# Patient Record
Sex: Male | Born: 1950
Health system: Southern US, Community
[De-identification: ages and names within clinical notes are randomized; demographics above are authoritative.]

## PROBLEM LIST (undated history)

## (undated) DIAGNOSIS — G40909 Epilepsy, unspecified, not intractable, without status epilepticus: Secondary | ICD-10-CM

## (undated) DIAGNOSIS — I4891 Unspecified atrial fibrillation: Secondary | ICD-10-CM

## (undated) DIAGNOSIS — R569 Unspecified convulsions: Secondary | ICD-10-CM

## (undated) DIAGNOSIS — Z8673 Personal history of transient ischemic attack (TIA), and cerebral infarction without residual deficits: Secondary | ICD-10-CM

## (undated) DIAGNOSIS — F1010 Alcohol abuse, uncomplicated: Secondary | ICD-10-CM

## (undated) DIAGNOSIS — I10 Essential (primary) hypertension: Secondary | ICD-10-CM

## (undated) HISTORY — DX: Epilepsy, unspecified, not intractable, without status epilepticus: G40.909

## (undated) HISTORY — DX: Unspecified atrial fibrillation: I48.91

## (undated) HISTORY — DX: Alcohol abuse, uncomplicated: F10.10

## (undated) HISTORY — PX: JOINT REPLACEMENT: SHX530

## (undated) HISTORY — DX: Personal history of transient ischemic attack (TIA), and cerebral infarction without residual deficits: Z86.73

---

## 2004-08-11 ENCOUNTER — Ambulatory Visit: Payer: Self-pay

## 2004-09-24 ENCOUNTER — Emergency Department: Payer: Self-pay | Admitting: Emergency Medicine

## 2005-03-09 ENCOUNTER — Other Ambulatory Visit: Payer: Self-pay

## 2005-03-16 ENCOUNTER — Inpatient Hospital Stay: Payer: Self-pay | Admitting: Specialist

## 2006-06-09 ENCOUNTER — Ambulatory Visit: Payer: Self-pay | Admitting: Nurse Practitioner

## 2006-11-15 ENCOUNTER — Ambulatory Visit: Payer: Self-pay | Admitting: Gastroenterology

## 2007-05-20 ENCOUNTER — Ambulatory Visit: Payer: Self-pay | Admitting: Family Medicine

## 2007-06-13 ENCOUNTER — Ambulatory Visit: Payer: Self-pay | Admitting: Family Medicine

## 2016-04-05 ENCOUNTER — Emergency Department: Payer: Medicare Other

## 2016-04-05 ENCOUNTER — Inpatient Hospital Stay: Payer: Medicare Other

## 2016-04-05 ENCOUNTER — Inpatient Hospital Stay
Admission: EM | Admit: 2016-04-05 | Discharge: 2016-04-06 | DRG: 065 | Disposition: A | Discharge status: Home / Self Care | Payer: Medicare Other | Attending: Internal Medicine | Admitting: Internal Medicine

## 2016-04-05 ENCOUNTER — Encounter: Payer: Self-pay | Admitting: Emergency Medicine

## 2016-04-05 DIAGNOSIS — Z8673 Personal history of transient ischemic attack (TIA), and cerebral infarction without residual deficits: Secondary | ICD-10-CM | POA: Diagnosis present

## 2016-04-05 DIAGNOSIS — J441 Chronic obstructive pulmonary disease with (acute) exacerbation: Secondary | ICD-10-CM | POA: Diagnosis present

## 2016-04-05 DIAGNOSIS — F1010 Alcohol abuse, uncomplicated: Secondary | ICD-10-CM | POA: Diagnosis present

## 2016-04-05 DIAGNOSIS — I10 Essential (primary) hypertension: Secondary | ICD-10-CM | POA: Diagnosis present

## 2016-04-05 DIAGNOSIS — I639 Cerebral infarction, unspecified: Secondary | ICD-10-CM | POA: Diagnosis not present

## 2016-04-05 DIAGNOSIS — R0602 Shortness of breath: Secondary | ICD-10-CM | POA: Diagnosis not present

## 2016-04-05 DIAGNOSIS — I638 Other cerebral infarction: Secondary | ICD-10-CM | POA: Diagnosis not present

## 2016-04-05 DIAGNOSIS — E785 Hyperlipidemia, unspecified: Secondary | ICD-10-CM | POA: Diagnosis present

## 2016-04-05 DIAGNOSIS — Z23 Encounter for immunization: Secondary | ICD-10-CM

## 2016-04-05 DIAGNOSIS — E119 Type 2 diabetes mellitus without complications: Secondary | ICD-10-CM | POA: Diagnosis present

## 2016-04-05 DIAGNOSIS — M6281 Muscle weakness (generalized): Secondary | ICD-10-CM

## 2016-04-05 DIAGNOSIS — Z9114 Patient's other noncompliance with medication regimen: Secondary | ICD-10-CM | POA: Diagnosis not present

## 2016-04-05 DIAGNOSIS — Z87891 Personal history of nicotine dependence: Secondary | ICD-10-CM

## 2016-04-05 DIAGNOSIS — R200 Anesthesia of skin: Secondary | ICD-10-CM

## 2016-04-05 DIAGNOSIS — J449 Chronic obstructive pulmonary disease, unspecified: Secondary | ICD-10-CM

## 2016-04-05 HISTORY — DX: Essential (primary) hypertension: I10

## 2016-04-05 LAB — COMPREHENSIVE METABOLIC PANEL
ALT: 12 U/L — ABNORMAL LOW (ref 17–63)
ANION GAP: 12 (ref 5–15)
AST: 31 U/L (ref 15–41)
Albumin: 4.6 g/dL (ref 3.5–5.0)
Alkaline Phosphatase: 73 U/L (ref 38–126)
BUN: 8 mg/dL (ref 6–20)
CHLORIDE: 100 mmol/L — AB (ref 101–111)
CO2: 26 mmol/L (ref 22–32)
Calcium: 9.4 mg/dL (ref 8.9–10.3)
Creatinine, Ser: 0.85 mg/dL (ref 0.61–1.24)
GFR calc non Af Amer: >60 mL/min (ref >60)
Glucose, Bld: 98 mg/dL (ref 65–99)
Potassium: 3.4 mmol/L — ABNORMAL LOW (ref 3.5–5.1)
SODIUM: 138 mmol/L (ref 135–145)
Total Bilirubin: 0.8 mg/dL (ref 0.3–1.2)
Total Protein: 8.3 g/dL — ABNORMAL HIGH (ref 6.5–8.1)

## 2016-04-05 LAB — CBC
HCT: 43.8 % (ref 40.0–52.0)
HEMOGLOBIN: 15 g/dL (ref 13.0–18.0)
MCH: 31.1 pg (ref 26.0–34.0)
MCHC: 34.4 g/dL (ref 32.0–36.0)
MCV: 90.4 fL (ref 80.0–100.0)
Platelets: 202 10*3/uL (ref 150–440)
RBC: 4.84 MIL/uL (ref 4.40–5.90)
RDW: 15.3 % — AB (ref 11.5–14.5)
WBC: 7.4 10*3/uL (ref 3.8–10.6)

## 2016-04-05 LAB — BLOOD GAS, VENOUS
ACID-BASE EXCESS: 3.5 mmol/L — AB (ref 0.0–2.0)
BICARBONATE: 27.8 mmol/L (ref 20.0–28.0)
PCO2 VEN: 40 mmHg — AB (ref 44.0–60.0)
Patient temperature: 37
pH, Ven: 7.45 — ABNORMAL HIGH (ref 7.250–7.430)

## 2016-04-05 LAB — TROPONIN I

## 2016-04-05 LAB — BRAIN NATRIURETIC PEPTIDE: B Natriuretic Peptide: 104 pg/mL — ABNORMAL HIGH (ref 0.0–100.0)

## 2016-04-05 MED ORDER — ALBUTEROL SULFATE (2.5 MG/3ML) 0.083% IN NEBU: 2.5000 mg | INHALATION_SOLUTION | RESPIRATORY_TRACT | Status: DC | PRN

## 2016-04-05 MED ORDER — NITROGLYCERIN 0.4 MG SL SUBL
SUBLINGUAL_TABLET | SUBLINGUAL | Status: AC
  Filled 2016-04-05: qty 1

## 2016-04-05 MED ORDER — CLONIDINE HCL 0.1 MG PO TABS
0.2000 mg | ORAL_TABLET | Freq: Two times a day (BID) | ORAL | Status: DC
  Administered 2016-04-05 – 2016-04-06 (×2): 0.2 mg via ORAL
  Filled 2016-04-05 (×2): qty 2

## 2016-04-05 MED ORDER — METHYLPREDNISOLONE SODIUM SUCC 40 MG IJ SOLR
40.0000 mg | Freq: Three times a day (TID) | INTRAMUSCULAR | Status: DC
  Administered 2016-04-05 – 2016-04-06 (×3): 40 mg via INTRAVENOUS
  Filled 2016-04-05 (×3): qty 1

## 2016-04-05 MED ORDER — FOLIC ACID 1 MG PO TABS
1.0000 mg | ORAL_TABLET | Freq: Every day | ORAL | Status: DC
  Administered 2016-04-06: 10:00:00 1 mg via ORAL
  Filled 2016-04-05: qty 1

## 2016-04-05 MED ORDER — STROKE: EARLY STAGES OF RECOVERY BOOK
Freq: Once | Status: AC
  Administered 2016-04-05: 17:00:00

## 2016-04-05 MED ORDER — DEXTROSE 5 % IV SOLN
500.0000 mg | INTRAVENOUS | Status: DC
  Administered 2016-04-05: 500 mg via INTRAVENOUS
  Filled 2016-04-05 (×2): qty 500

## 2016-04-05 MED ORDER — IPRATROPIUM-ALBUTEROL 0.5-2.5 (3) MG/3ML IN SOLN
3.0000 mL | Freq: Once | RESPIRATORY_TRACT | Status: AC
  Administered 2016-04-05: 3 mL via RESPIRATORY_TRACT

## 2016-04-05 MED ORDER — LORAZEPAM 2 MG/ML IJ SOLN: 1.0000 mg | Freq: Four times a day (QID) | INTRAMUSCULAR | Status: DC | PRN

## 2016-04-05 MED ORDER — ASPIRIN 300 MG RE SUPP: 300.0000 mg | Freq: Every day | RECTAL | Status: DC

## 2016-04-05 MED ORDER — LISINOPRIL 10 MG PO TABS
10.0000 mg | ORAL_TABLET | Freq: Once | ORAL | Status: AC
  Administered 2016-04-05: 10 mg via ORAL
  Filled 2016-04-05: qty 1

## 2016-04-05 MED ORDER — PNEUMOCOCCAL VAC POLYVALENT 25 MCG/0.5ML IJ INJ
0.5000 mL | INJECTION | INTRAMUSCULAR | Status: AC
  Administered 2016-04-06: 0.5 mL via INTRAMUSCULAR
  Filled 2016-04-05: qty 0.5

## 2016-04-05 MED ORDER — MAGNESIUM SULFATE 2 GM/50ML IV SOLN
2.0000 g | Freq: Once | INTRAVENOUS | Status: AC
  Administered 2016-04-05: 2 g via INTRAVENOUS
  Filled 2016-04-05: qty 50

## 2016-04-05 MED ORDER — ACETAMINOPHEN 325 MG PO TABS
650.0000 mg | ORAL_TABLET | ORAL | Status: DC | PRN
  Administered 2016-04-05 – 2016-04-06 (×2): 650 mg via ORAL
  Filled 2016-04-05 (×2): qty 2

## 2016-04-05 MED ORDER — FUROSEMIDE 10 MG/ML IJ SOLN
INTRAMUSCULAR | Status: AC
  Filled 2016-04-05: qty 10

## 2016-04-05 MED ORDER — HYDRALAZINE HCL 50 MG PO TABS
50.0000 mg | ORAL_TABLET | Freq: Three times a day (TID) | ORAL | Status: DC
  Administered 2016-04-05 – 2016-04-06 (×2): 50 mg via ORAL
  Filled 2016-04-05 (×2): qty 1

## 2016-04-05 MED ORDER — NITROGLYCERIN 2 % TD OINT
1.0000 [in_us] | TOPICAL_OINTMENT | Freq: Once | TRANSDERMAL | Status: AC
  Administered 2016-04-05: 1 [in_us] via TOPICAL

## 2016-04-05 MED ORDER — THIAMINE HCL 100 MG/ML IJ SOLN
100.0000 mg | Freq: Every day | INTRAMUSCULAR | Status: DC
  Administered 2016-04-05 – 2016-04-06 (×2): 100 mg via INTRAVENOUS
  Filled 2016-04-05 (×2): qty 2

## 2016-04-05 MED ORDER — METOPROLOL TARTRATE 5 MG/5ML IV SOLN
5.0000 mg | INTRAVENOUS | Status: DC | PRN
  Administered 2016-04-05 (×2): 5 mg via INTRAVENOUS
  Filled 2016-04-05 (×3): qty 5

## 2016-04-05 MED ORDER — ACETAMINOPHEN 160 MG/5ML PO SOLN: 650.0000 mg | ORAL | Status: DC | PRN

## 2016-04-05 MED ORDER — ACETAMINOPHEN 650 MG RE SUPP
650.0000 mg | RECTAL | Status: DC | PRN
Start: 2016-04-05 — End: 2016-04-06

## 2016-04-05 MED ORDER — METHYLPREDNISOLONE SODIUM SUCC 125 MG IJ SOLR
125.0000 mg | Freq: Once | INTRAMUSCULAR | Status: AC
  Administered 2016-04-05: 125 mg via INTRAVENOUS
  Filled 2016-04-05: qty 2

## 2016-04-05 MED ORDER — IPRATROPIUM-ALBUTEROL 0.5-2.5 (3) MG/3ML IN SOLN: 3.0000 mL | Freq: Four times a day (QID) | RESPIRATORY_TRACT | Status: DC | PRN

## 2016-04-05 MED ORDER — IPRATROPIUM-ALBUTEROL 0.5-2.5 (3) MG/3ML IN SOLN
3.0000 mL | Freq: Four times a day (QID) | RESPIRATORY_TRACT | Status: DC
  Administered 2016-04-05: 3 mL via RESPIRATORY_TRACT
  Filled 2016-04-05: qty 3

## 2016-04-05 MED ORDER — IPRATROPIUM-ALBUTEROL 0.5-2.5 (3) MG/3ML IN SOLN
RESPIRATORY_TRACT | Status: AC
  Filled 2016-04-05: qty 3

## 2016-04-05 MED ORDER — ADULT MULTIVITAMIN W/MINERALS CH
1.0000 | ORAL_TABLET | Freq: Every day | ORAL | Status: DC
  Administered 2016-04-06: 1 via ORAL
  Filled 2016-04-05: qty 1

## 2016-04-05 MED ORDER — INFLUENZA VAC SPLIT QUAD 0.5 ML IM SUSY
0.5000 mL | PREFILLED_SYRINGE | INTRAMUSCULAR | Status: AC
  Administered 2016-04-06: 10:00:00 0.5 mL via INTRAMUSCULAR
  Filled 2016-04-05: qty 0.5

## 2016-04-05 MED ORDER — NITROGLYCERIN 0.4 MG SL SUBL
0.4000 mg | SUBLINGUAL_TABLET | SUBLINGUAL | Status: DC | PRN
  Administered 2016-04-05: 0.4 mg via SUBLINGUAL

## 2016-04-05 MED ORDER — METOPROLOL TARTRATE 50 MG PO TABS
50.0000 mg | ORAL_TABLET | Freq: Two times a day (BID) | ORAL | Status: DC
  Administered 2016-04-05 – 2016-04-06 (×2): 50 mg via ORAL
  Filled 2016-04-05 (×2): qty 1

## 2016-04-05 MED ORDER — HYDRALAZINE HCL 20 MG/ML IJ SOLN
10.0000 mg | Freq: Four times a day (QID) | INTRAMUSCULAR | Status: DC | PRN
  Administered 2016-04-05: 17:00:00 10 mg via INTRAVENOUS
  Filled 2016-04-05: qty 1

## 2016-04-05 MED ORDER — ENOXAPARIN SODIUM 40 MG/0.4ML ~~LOC~~ SOLN
40.0000 mg | SUBCUTANEOUS | Status: DC
  Administered 2016-04-05: 22:00:00 40 mg via SUBCUTANEOUS
  Filled 2016-04-05: qty 0.4

## 2016-04-05 MED ORDER — SENNOSIDES-DOCUSATE SODIUM 8.6-50 MG PO TABS: 1.0000 | ORAL_TABLET | Freq: Every evening | ORAL | Status: DC | PRN

## 2016-04-05 MED ORDER — NITROGLYCERIN 2 % TD OINT
TOPICAL_OINTMENT | TRANSDERMAL | Status: AC
  Filled 2016-04-05: qty 1

## 2016-04-05 MED ORDER — IPRATROPIUM-ALBUTEROL 0.5-2.5 (3) MG/3ML IN SOLN
3.0000 mL | Freq: Once | RESPIRATORY_TRACT | Status: AC
  Administered 2016-04-05: 3 mL via RESPIRATORY_TRACT
  Filled 2016-04-05: qty 3

## 2016-04-05 NOTE — ED Notes (Signed)
Called MRI Tech [phone]

## 2016-04-05 NOTE — ED Triage Notes (Addendum)
Pt reports feeling short of breath for a few days, worsening tonight; pt arrives diaphoretic; reports productive cough; pt adds yesterday he began having numbness to the left side of his face and left arm; has not checked temp at home but says he has been feeling warm; denies pain

## 2016-04-05 NOTE — ED Notes (Signed)
Report to felicia, rn.  

## 2016-04-05 NOTE — ED Notes (Signed)
Pox of 90% on ra. Pt placed on oxygen at 2lpm via [place]. Rebound pox of 95%.

## 2016-04-05 NOTE — ED Notes (Signed)
Pt taken to treatment room 12 via wheelchair; verbal report given to April, RN and Dr Zenda Alpers

## 2016-04-05 NOTE — ED Notes (Signed)
Awaiting acceptable BP per floor perameters. PRN meds given. And will continue to monitor.

## 2016-04-05 NOTE — ED Provider Notes (Signed)
[facility]  I accepted care from Dr. Zenda Alpers ____________________________________________    LABS (pertinent positives/negatives)  Labs Reviewed  CBC - Abnormal; Notable for the following:       Result Value   RDW 15.3 (*)    All other components within normal limits  COMPREHENSIVE METABOLIC PANEL - Abnormal; Notable for the following:    Potassium 3.4 (*)    Chloride 100 (*)    Total Protein 8.3 (*)    ALT 12 (*)    All other components within normal limits  BLOOD GAS, VENOUS - Abnormal; Notable for the following:    pH, Ven 7.45 (*)    pCO2, Ven 40 (*)    Acid-Base Excess 3.5 (*)    All other components within normal limits  BRAIN NATRIURETIC PEPTIDE - Abnormal; Notable for the following:    B Natriuretic Peptide 104.0 (*)    All other components within normal limits  TROPONIN I     ____________________________________________    RADIOLOGY All xrays were viewed by me. Imaging interpreted by radiologist.  MRI brain: IMPRESSION: Acute 3 mm lacunar infarct of the RIGHT thalamus is consistent with reported LEFT-sided sensory symptoms.  Atrophy and small vessel disease.  ____________________________________________   PROCEDURES  Procedure(s) performed: None  Critical Care performed: None  ____________________________________________   INITIAL IMPRESSION / ASSESSMENT AND PLAN / ED COURSE   Pertinent labs & imaging results that were available during my care of the patient were reviewed by me and considered in my medical decision making (see chart for details).  I accepted care from Dr. Zenda Alpers, awaiting MRI results after normal head CT.  Patient had received treatment for wheezing/COPD which patient is somewhat improved. He does still have elevated blood pressures, given ischemic stroke, will allow permissive hypertension, monitoring closely with goal of treatment for ED 220/120.  Patient be admitted to the hospitalist  service.  CONSULTATIONS: Hospitalist for admission    Patient / Family / Caregiver informed of clinical course, medical decision-making process, and agree with plan.   ____________________________________________   FINAL CLINICAL IMPRESSION(S) / ED DIAGNOSES  Final diagnoses:  Chronic obstructive pulmonary disease, unspecified COPD type (HCC)  Numbness  Shortness of breath  Acute ischemic stroke (HCC)        Governor Rooks, MD 04/05/16 1244

## 2016-04-05 NOTE — ED Notes (Signed)
Pt with improved resp distress. Pt states he feels 50% improved.

## 2016-04-05 NOTE — H&P (Signed)
[facility] [place] at [facility]   PATIENT NAME: John Spears    MR#:  [id number]  DATE OF BIRTH:  [birth year]  DATE OF ADMISSION:  04/05/2016  PRIMARY CARE PHYSICIAN: Pcp Not In System   REQUESTING/REFERRING PHYSICIAN:Rebecca Lord, MD  CHIEF COMPLAINT:  Left facial numbness and shortness of breath  HISTORY OF PRESENT ILLNESS:  John Spears  is a 66 y.o. male with a known history of Hypertension not on any home medications is presenting to the ED with a chief complaint of left facial numbness and left upper extremity numbness associated with shortness of breath. Patient was not taking any blood pressure medications for the past 6-8 years. Admits to drinking alcohol almost every day in a week Initial CT head is negative but MRI has revealed a right thalamus CVA. Patient has received IV Solu-Medrol and breathing treatments  PAST MEDICAL HISTORY:   Past Medical History:  Diagnosis Date  . Hypertension     PAST SURGICAL HISTOIRY:   Past Surgical History:  Procedure Laterality Date  . JOINT REPLACEMENT      SOCIAL HISTORY:   Social History  Substance Use Topics  . Smoking status: Former Games developer  . Smokeless tobacco: Not on file  . Alcohol use Yes    FAMILY HISTORY:   Father has history of strokes DRUG ALLERGIES:  No Known Allergies  REVIEW OF SYSTEMS:  CONSTITUTIONAL: No fever, fatigue or weakness.  EYES: No blurred or double vision.  EARS, NOSE, AND THROAT: No tinnitus or ear pain.  RESPIRATORY: No cough, Reporting shortness of breath, denies wheezing or hemoptysis.  CARDIOVASCULAR: No chest pain, orthopnea, edema.  GASTROINTESTINAL: No nausea, vomiting, diarrhea or abdominal pain.  GENITOURINARY: No dysuria, hematuria.  ENDOCRINE: No polyuria, nocturia,  HEMATOLOGY: No anemia, easy bruising or bleeding SKIN: No rash or lesion. MUSCULOSKELETAL: No joint pain or arthritis.   NEUROLOGIC: Reporting left facial and left upper extending  to numbness and weakness Psychiatry denies any depression   MEDICATIONS AT HOME:   Prior to Admission medications   Not on File      VITAL SIGNS:  Blood pressure (!) 195/110, pulse 88, temperature 98.3 F (36.8 C), temperature source Oral, resp. rate 18, height 5\' 11"  (1.803 m), weight 81.6 kg (180 lb), SpO2 94 %.  PHYSICAL EXAMINATION:  GENERAL:  66 y.o.-year-old patient lying in the bed with no acute distress.  EYES: Pupils equal, round, reactive to light and accommodation. No scleral icterus. Extraocular muscles intact.  HEENT: Head atraumatic, normocephalic. Oropharynx and nasopharynx clear.  NECK:  Supple, no jugular venous distention. No thyroid enlargement, no tenderness.  LUNGS: Moderate breath sounds bilaterally, no wheezing, rales,rhonchi or crepitation. No use of accessory muscles of respiration.  CARDIOVASCULAR: S1, S2 normal. No murmurs, rubs, or gallops.  ABDOMEN: Soft, nontender, nondistended. Bowel sounds present. No organomegaly or mass.  EXTREMITIES: No pedal edema, cyanosis, or clubbing.  NEUROLOGIC: Cranial nerves II through XII are intact. Muscle strength 5/5 in all extremities.left face and left upper extremity with decreased touch sensation Gait not checked.  PSYCHIATRIC: The patient is alert and oriented x 3.  SKIN: No obvious rash, lesion, or ulcer.   LABORATORY PANEL:   CBC  Recent Labs Lab 04/05/16 0520  WBC 7.4  HGB 15.0  HCT 43.8  PLT 202   ------------------------------------------------------------------------------------------------------------------  Chemistries   Recent Labs Lab 04/05/16 0520  NA 138  K 3.4*  CL 100*  CO2 26  GLUCOSE 98  BUN 8  CREATININE 0.85  CALCIUM 9.4  AST 31  ALT 12*  ALKPHOS 73  BILITOT 0.8   ------------------------------------------------------------------------------------------------------------------  Cardiac Enzymes  Recent Labs Lab 04/05/16 0520  TROPONINI <0.03    ------------------------------------------------------------------------------------------------------------------  RADIOLOGY:  Dg Chest 2 View  Result Date: 04/05/2016 CLINICAL DATA:  Shortness of breath for a few days, worse tonight. Cough. Numbness in the left face and left arm. EXAM: CHEST  2 VIEW COMPARISON:  03/09/2005 FINDINGS: The heart size and mediastinal contours are within normal limits. Both lungs are clear. The visualized skeletal structures are unremarkable. IMPRESSION: No active cardiopulmonary disease. Electronically Signed   By: Burman Nieves M.D.   On: 04/05/2016 05:59   Ct Head Wo Contrast  Result Date: 04/05/2016 CLINICAL DATA:  Shortness of breath and diaphoresis. Numbness to the left side of face and left arm. EXAM: CT HEAD WITHOUT CONTRAST TECHNIQUE: Contiguous axial images were obtained from the base of the skull through the vertex without intravenous contrast. COMPARISON:  None. FINDINGS: Brain: Mild diffuse cerebral atrophy. Mild ventricular dilatation consistent with central atrophy. Scattered low-attenuation changes in the deep white matter consistent with small vessel ischemia. No mass effect or midline shift. No abnormal extra-axial fluid collections. Gray-white matter junctions are distinct. Basal cisterns are not effaced. Vascular: No hyperdense vessel or unexpected calcification. Skull: Normal. Negative for fracture or focal lesion. Sinuses/Orbits: Mild mucosal thickening in the paranasal sinuses. Old appearing fracture deformity of the right medial orbital wall. Mastoid air cells are not opacified. Other: None. IMPRESSION: No acute intracranial abnormalities. Mild chronic atrophy and small vessel ischemic changes. Electronically Signed   By: Burman Nieves M.D.   On: 04/05/2016 06:21   Mr Brain Wo Contrast  Result Date: 04/05/2016 CLINICAL DATA:  Numbness in LEFT arm and LEFT side of face beginning earlier today. Chronic hypertension, not currently treated.  Elevated blood pressure on admission. EXAM: MRI HEAD WITHOUT CONTRAST TECHNIQUE: Multiplanar, multiecho pulse sequences of the brain and surrounding structures were obtained without intravenous contrast. COMPARISON:  CT head at 6 a.m., earlier today FINDINGS: Brain: There is a 3 mm area of restricted diffusion in the RIGHT thalamus representing an acute lacunar infarct. No hemorrhage, mass lesion, hydrocephalus, or extra-axial fluid. Premature for age cerebral and cerebellar atrophy. Mild to moderate T2 and FLAIR hyperintensity in the periventricular and subcortical white matter, likely representing chronic microvascular ischemic change. There is a chronic lacunar infarction of the RIGHT caudate nucleus. Vascular: Normal flow voids. Skull and upper cervical spine: Normal marrow signal. Sinuses/Orbits: No acute findings. There appears to be a chronic blowout injury of the RIGHT orbit. Other: None. Compared with earlier CT, the infarct is not visible. IMPRESSION: Acute 3 mm lacunar infarct of the RIGHT thalamus is consistent with reported LEFT-sided sensory symptoms. Atrophy and small vessel disease. Electronically Signed   By: Elsie Stain M.D.   On: 04/05/2016 11:33    EKG:   Orders placed or performed during the hospital encounter of 04/05/16  . EKG 12-Lead  . EKG 12-Lead  . ED EKG within 10 minutes  . ED EKG within 10 minutes    IMPRESSION AND PLAN:   John Spears  is a 66 y.o. male with a known history of Hypertension not on any home medications is presenting to the ED with a chief complaint of left facial numbness and left upper extremity numbness associated with shortness of breath. Patient was not taking any blood pressure medications for the past 6-8 years. Admits to drinking alcohol almost every day in a week  Initial CT head is negative but MRI has revealed a right thalamus CVA  # acute ischemic CVA-right thalamus stroke per MRI admit to MedSurg unit   Will get complete stroke workup  with carotid Dopplers and 2-D echocardiogram   aspirin rectally Check fasting lipid panel, TSH and hemoglobin A1c PTOT and speech therapy consult Nothing by mouth until patient passes bedside swallow evaluation Neurology consult placed  #Malignant hypertension which could be the etiology for acute ischemic CVA At this point allow permissive hypertension at this time IV Lopressor as needed systolic blood pressure is greater than 190 Rounding physician please consider to start.patient on oral antihypertensives for tight control of the blood pressure  #COPD exacerbation Solum Medrol, nebulizer treatments Supportive treatment and azithromycin  #Alcohol abuse Patient drinks alcohol almost every day ciwa Counseled to stop drinking alcohol. Patient will be benefited with outpatient alcohol rehabilitation  GI and DVT prophylaxis  All the records are reviewed and case discussed with ED provider. Management plans discussed with the patient, family and they are in agreement.  CODE STATUS: Full code, sister is the healthcare power of attorney  TOTAL TIME TAKING CARE OF THIS PATIENT: .   Note: This dictation was prepared with Dragon dictation along with smaller phrase technology. Any transcriptional errors that result from this process are unintentional.  Ramonita Lab M.D on 04/05/2016 at 1:40 PM  Between 7am to 6pm - Pager - [phone]  After 6pm go to [email or web address] - password [email or web address]  [facility]  Office  [phone]  CC: Primary care physician; Pcp Not In System

## 2016-04-05 NOTE — ED Notes (Signed)
Admitting MD at bedside.

## 2016-04-05 NOTE — ED Notes (Signed)
Mri, walk 02 sat // bp recheck   Governor Rooks, MD 04/05/16 [phone]

## 2016-04-05 NOTE — ED Provider Notes (Signed)
[facility] Emergency Department Provider Note   ____________________________________________   First MD Initiated Contact with Patient 04/05/16 [id number]     (approximate)  I have reviewed the triage vital signs and the nursing notes.   HISTORY  Chief Complaint Shortness of Breath and Numbness    HPI John Spears is a 66 y.o. male who comes into the hospital today with some numbness to the left arm and left side of his face. He reports he is also has some difficulty breathing. The numbness started yesterday afternoon but he reports that he's been having difficulty breathing for a couple of days. The patient hasn't taken anything for the symptoms. He has a cough and some mild chest pain. The patient also reports that he does have some sweats but denies any nausea, vomiting, dizziness, lightheadedness, abdominal pain. The patient has not been taking anything for his blood pressure for the past 6-8 years ago does not know his blood pressure typically runs. He reports that he drinks daily mostly beer about 3-4 times a day. The patient decided to come into the hospital today for further evaluation of these symptoms.   Past Medical History:  Diagnosis Date  . Hypertension     There are no active problems to display for this patient.   Past Surgical History:  Procedure Laterality Date  . JOINT REPLACEMENT      Prior to Admission medications   Not on File    Allergies Patient has no known allergies.  No family history on file.  Social History Social History  Substance Use Topics  . Smoking status: Former Games developer  . Smokeless tobacco: Not on file  . Alcohol use Yes    Review of Systems Constitutional: No fever/chills Eyes: No visual changes. ENT: No sore throat. Cardiovascular:  chest pain. Respiratory: cough and shortness of breath. Gastrointestinal: No abdominal pain.  No nausea, no vomiting.  No diarrhea.  No constipation. Genitourinary:  Negative for dysuria. Musculoskeletal: Negative for back pain. Skin: Negative for rash. Neurological: Facial numbness and left arm numbness  10-point ROS otherwise negative.  ____________________________________________   PHYSICAL EXAM:  VITAL SIGNS: ED Triage Vitals  Enc Vitals Group     BP 04/05/16 0511 (!) 247/114     Pulse Rate 04/05/16 0511 (!) 105     Resp 04/05/16 0511 20     Temp 04/05/16 0511 98.3 F (36.8 C)     Temp Source 04/05/16 0511 Oral     SpO2 04/05/16 0511 97 %     Weight 04/05/16 0511 180 lb (81.6 kg)     Height 04/05/16 0511 5\' 11"  (1.803 m)     Head Circumference --      Peak Flow --      Pain Score 04/05/16 0526 9     Pain Loc --      Pain Edu? --      Excl. in GC? --     Constitutional: Alert and oriented. Well appearing and in Moderate distress. Eyes: Conjunctivae are normal. PERRL. EOMI. Head: Atraumatic. Nose: No congestion/rhinnorhea. Mouth/Throat: Mucous membranes are moist.  Oropharynx non-erythematous. Cardiovascular: Normal rate, regular rhythm. Grossly normal heart sounds.  Good peripheral circulation. Respiratory: Increased respiratory effort.  No retractions. Expiratory wheezing throughout all lung fields. Gastrointestinal: Soft and nontender. No distention. Positive bowel sounds Musculoskeletal: No lower extremity tenderness nor edema.   Neurologic:  Normal speech and language. Cranial nerves II through XII are grossly intact aside from the patient having some left-sided  facial numbness and left upper extremity numbness, no ataxia with finger to nose, no difficulty with rapid alternating movement. Strength is 5 out of 5 throughout. Skin:  Skin is warm, dry and intact.  Psychiatric: Mood and affect are normal.   ____________________________________________   LABS (all labs ordered are listed, but only abnormal results are displayed)  Labs Reviewed  CBC - Abnormal; Notable for the following:       Result Value   RDW 15.3 (*)     All other components within normal limits  COMPREHENSIVE METABOLIC PANEL - Abnormal; Notable for the following:    Potassium 3.4 (*)    Chloride 100 (*)    Total Protein 8.3 (*)    ALT 12 (*)    All other components within normal limits  BLOOD GAS, VENOUS - Abnormal; Notable for the following:    pH, Ven 7.45 (*)    pCO2, Ven 40 (*)    Acid-Base Excess 3.5 (*)    All other components within normal limits  BRAIN NATRIURETIC PEPTIDE - Abnormal; Notable for the following:    B Natriuretic Peptide 104.0 (*)    All other components within normal limits  TROPONIN I   ____________________________________________  EKG  ED ECG REPORT I, Rebecka Apley, the attending physician, personally viewed and interpreted this ECG.   Date: 04/05/2016  EKG Time: 509  Rate: 104  Rhythm: sinus tachycardia  Axis: left axis deviation  Intervals:left anterior fascicular block  ST&T Change: None  ____________________________________________  RADIOLOGY  CT head CXR ____________________________________________   PROCEDURES  Procedure(s) performed: None  Procedures  Critical Care performed: No  ____________________________________________   INITIAL IMPRESSION / ASSESSMENT AND PLAN / ED COURSE  Pertinent labs & imaging results that were available during my care of the patient were reviewed by me and considered in my medical decision making (see chart for details).  This is a 66 year old male who comes into the hospital today with some shortness of breath. The patient is been having shortness of breath for multiple days. He also has been having some numbness to his face and his upper extremity. The patient's blood pressure is significantly elevated when he arrived but he has not been taking blood pressure medicines for multiple years. He was wheezing when I initially evaluated him sign to give him a DuoNeb and some Solu-Medrol. I placed a Nitropaste to the patient's chest and gave him  some nitroglycerin sublingually. The patient's chest x-ray did not show any pulmonary edema and his BNP was negative. I feel that the patient may be having a COPD exacerbation given his symptoms. I will give the patient 2 more DuoNeb treatments and some magnesium sulfate. Although the patient's CT of his head is negative he is still having some numbness to his face and his arm. I will send the patient for an MRI to evaluate for possible subacute stroke. The patient's care was signed out to Dr. Shaune Pollack who will follow-up the results of the patient's symptoms. We will give the patient a dose of lisinopril as well and he will be reassessed.   Clinical Course as of Apr 05 857  Wynelle Link Apr 05, 2016  0706 No acute intracranial abnormalities. Mild chronic atrophy and small vessel ischemic changes.   CT Head Wo Contrast [AW]  0707 No active cardiopulmonary disease. DG Chest 2 View [AW]    Clinical Course User Index [AW] Rebecka Apley, MD     ____________________________________________   FINAL CLINICAL IMPRESSION(S) /  ED DIAGNOSES  Final diagnoses:  Chronic obstructive pulmonary disease, unspecified COPD type (HCC)  Numbness  Shortness of breath      NEW MEDICATIONS STARTED DURING THIS VISIT:  New Prescriptions   No medications on file     Note:  This document was prepared using Dragon voice recognition software and may include unintentional dictation errors.    Rebecka Apley, MD 04/05/16 0900

## 2016-04-06 ENCOUNTER — Inpatient Hospital Stay
Admit: 2016-04-06 | Discharge: 2016-04-06 | Disposition: A | Discharge status: Home / Self Care | Payer: Medicare Other | Attending: Internal Medicine | Admitting: Internal Medicine

## 2016-04-06 DIAGNOSIS — I639 Cerebral infarction, unspecified: Secondary | ICD-10-CM

## 2016-04-06 LAB — LIPID PANEL
CHOL/HDL RATIO: 3.1 ratio
Cholesterol: 279 mg/dL — ABNORMAL HIGH (ref 0–200)
HDL: 91 mg/dL (ref >40)
LDL CALC: 175 mg/dL — AB (ref 0–99)
TRIGLYCERIDES: 67 mg/dL (ref <150)
VLDL: 13 mg/dL (ref 0–40)

## 2016-04-06 LAB — ECHOCARDIOGRAM COMPLETE
Height: 71 in
Weight: 2880 oz

## 2016-04-06 LAB — TSH: TSH: 0.593 u[IU]/mL (ref 0.350–4.500)

## 2016-04-06 MED ORDER — METOPROLOL TARTRATE 50 MG PO TABS: 50.0000 mg | ORAL_TABLET | Freq: Two times a day (BID) | ORAL | 2 refills | Status: DC

## 2016-04-06 MED ORDER — HYDROCHLOROTHIAZIDE 25 MG PO TABS
25.0000 mg | ORAL_TABLET | Freq: Every day | ORAL | Status: DC
  Administered 2016-04-06: 25 mg via ORAL
  Filled 2016-04-06: qty 1

## 2016-04-06 MED ORDER — VITAMIN B-1 100 MG PO TABS: 100.0000 mg | ORAL_TABLET | Freq: Every day | ORAL | Status: DC

## 2016-04-06 MED ORDER — ASPIRIN 325 MG PO TABS
325.0000 mg | ORAL_TABLET | Freq: Every day | ORAL | Status: DC
  Administered 2016-04-06: 12:00:00 325 mg via ORAL
  Filled 2016-04-06: qty 1

## 2016-04-06 MED ORDER — AMLODIPINE BESYLATE 10 MG PO TABS
10.0000 mg | ORAL_TABLET | Freq: Every day | ORAL | Status: DC
  Administered 2016-04-06: 13:00:00 10 mg via ORAL
  Filled 2016-04-06: qty 1

## 2016-04-06 MED ORDER — ATORVASTATIN CALCIUM 20 MG PO TABS: 40.0000 mg | ORAL_TABLET | Freq: Every day | ORAL | Status: DC

## 2016-04-06 MED ORDER — AMLODIPINE BESYLATE 10 MG PO TABS: 10.0000 mg | ORAL_TABLET | Freq: Every day | ORAL | 2 refills | Status: DC

## 2016-04-06 MED ORDER — ATORVASTATIN CALCIUM 40 MG PO TABS: 40.0000 mg | ORAL_TABLET | Freq: Every day | ORAL | 2 refills | Status: DC

## 2016-04-06 MED ORDER — HYDROCHLOROTHIAZIDE 25 MG PO TABS: 25.0000 mg | ORAL_TABLET | Freq: Every day | ORAL | 2 refills | Status: DC

## 2016-04-06 MED ORDER — ASPIRIN 325 MG PO TABS: 325.0000 mg | ORAL_TABLET | Freq: Every day | ORAL | 2 refills | Status: DC

## 2016-04-06 NOTE — Discharge Summary (Signed)
[facility] [place] [facility]   PATIENT NAME: John Spears    MR#:  [id number]  DATE OF BIRTH:  1951-01-10  DATE OF ADMISSION:  04/05/2016   ADMITTING PHYSICIAN: Ramonita Lab, MD  DATE OF DISCHARGE: 04/06/2016  2:45 PM  PRIMARY CARE PHYSICIAN: Pcp Not In System   ADMISSION DIAGNOSIS:  Shortness of breath [R06.02] Numbness [R20.0] Acute ischemic stroke (HCC) [I63.9] Chronic obstructive pulmonary disease, unspecified COPD type (HCC) [J44.9] DISCHARGE DIAGNOSIS:  Active Problems:   Acute CVA (cerebrovascular accident) (HCC)  SECONDARY DIAGNOSIS:   Past Medical History:  Diagnosis Date  . Hypertension    HOSPITAL COURSE:  John Spears  is a 66 y.o. male with a known history of Hypertension not on any home medications is presenting to the ED with a chief complaint of left facial numbness and left upper extremity numbness associated with shortness of breath. Patient was not taking any blood pressure medications for the past 6-8 years. Admits to drinking alcohol almost every day in a week Initial CT head is negative but MRI has revealed a right thalamus CVA  # acute ischemic CVA-right thalamus stroke per MRI Unremarkable carotid Dopplers and 2-D echocardiogram  Started aspirin and Lipitor.  HLP. Started Lipitor.  #Malignant hypertension. Started hypertension medication. Blood pressure is better controlled.  #COPD exacerbation. Improved. The patient has no complaints. He was treated with Solum Medrol, nebulizer treatments Supportive treatment and azithromycin.  Lungs sounds are clear. No need of prednisone or abx.  #Alcohol abuse Patient drinks alcohol almost every day ciwa Counseled to stop drinking alcohol. Patient will be benefited with outpatient alcohol rehabilitation  DISCHARGE CONDITIONS:  Stable, the patient was discharged home today. CONSULTS OBTAINED:  Treatment Team:  Kym Groom, MD Thana Farr, MD DRUG ALLERGIES:    No Known Allergies DISCHARGE MEDICATIONS:   Allergies as of 04/06/2016   No Known Allergies     Medication List    TAKE these medications   amLODipine 10 MG tablet Commonly known as:  NORVASC Take 1 tablet (10 mg total) by mouth daily.   aspirin 325 MG tablet Take 1 tablet (325 mg total) by mouth daily. Start taking on:  04/07/2016   atorvastatin 40 MG tablet Commonly known as:  LIPITOR Take 1 tablet (40 mg total) by mouth daily at 6 PM.   hydrochlorothiazide 25 MG tablet Commonly known as:  HYDRODIURIL Take 1 tablet (25 mg total) by mouth daily.   metoprolol 50 MG tablet Commonly known as:  LOPRESSOR Take 1 tablet (50 mg total) by mouth 2 (two) times daily.        DISCHARGE INSTRUCTIONS:  See AVS.  If you experience worsening of your admission symptoms, develop shortness of breath, life threatening emergency, suicidal or homicidal thoughts you must seek medical attention immediately by calling 911 or calling your MD immediately  if symptoms less severe.  You Must read complete instructions/literature along with all the possible adverse reactions/side effects for all the Medicines you take and that have been prescribed to you. Take any new Medicines after you have completely understood and accpet all the possible adverse reactions/side effects.   Please note  You were cared for by a hospitalist during your hospital stay. If you have any questions about your discharge medications or the care you received while you were in the hospital after you are discharged, you can call the unit and asked to speak with the hospitalist on call if the hospitalist that took care of you  is not available. Once you are discharged, your primary care physician will handle any further medical issues. Please note that NO REFILLS for any discharge medications will be authorized once you are discharged, as it is imperative that you return to your primary care physician (or establish a relationship  with a primary care physician if you do not have one) for your aftercare needs so that they can reassess your need for medications and monitor your lab values.    On the day of Discharge:  VITAL SIGNS:  Blood pressure (!) 170/93, pulse 73, temperature 97.3 F (36.3 C), temperature source Oral, resp. rate (!) 22, height 5\' 11"  (1.803 m), weight 180 lb (81.6 kg), SpO2 98 %. PHYSICAL EXAMINATION:  GENERAL:  66 y.o.-year-old patient lying in the bed with no acute distress.  EYES: Pupils equal, round, reactive to light and accommodation. No scleral icterus. Extraocular muscles intact.  HEENT: Head atraumatic, normocephalic. Oropharynx and nasopharynx clear.  NECK:  Supple, no jugular venous distention. No thyroid enlargement, no tenderness.  LUNGS: Normal breath sounds bilaterally, no wheezing, rales,rhonchi or crepitation. No use of accessory muscles of respiration.  CARDIOVASCULAR: S1, S2 normal. No murmurs, rubs, or gallops.  ABDOMEN: Soft, non-tender, non-distended. Bowel sounds present. No organomegaly or mass.  EXTREMITIES: No pedal edema, cyanosis, or clubbing.  NEUROLOGIC: Cranial nerves II through XII are intact. Muscle strength 5/5 in all extremities. Sensation intact. Gait not checked.  PSYCHIATRIC: The patient is alert and oriented x 3.  SKIN: No obvious rash, lesion, or ulcer.  DATA REVIEW:   CBC  Recent Labs Lab 04/05/16 0520  WBC 7.4  HGB 15.0  HCT 43.8  PLT 202    Chemistries   Recent Labs Lab 04/05/16 0520  NA 138  K 3.4*  CL 100*  CO2 26  GLUCOSE 98  BUN 8  CREATININE 0.85  CALCIUM 9.4  AST 31  ALT 12*  ALKPHOS 73  BILITOT 0.8     Microbiology Results  No results found for this or any previous visit.  RADIOLOGY:  US Carotid Bilateral (at [facility])  Result Date: 04/06/2016 CLINICAL DATA:  66 year old male with a history of TIA. Cardiovascular risk factors include hypertension, known prior stroke/TIA, tobacco use EXAM: BILATERAL CAROTID  DUPLEX ULTRASOUND TECHNIQUE: Wallace Cullens scale imaging, color Doppler and duplex ultrasound were performed of bilateral carotid and vertebral arteries in the neck. COMPARISON:  MR 04/05/2016 FINDINGS: Criteria: Quantification of carotid stenosis is based on velocity parameters that correlate the residual internal carotid diameter with NASCET-based stenosis levels, using the diameter of the distal internal carotid lumen as the denominator for stenosis measurement. The following velocity measurements were obtained: RIGHT ICA:  Systolic 66 cm/sec, Diastolic 17 cm/sec CCA:  89 cm/sec SYSTOLIC ICA/CCA RATIO:  0.8 ECA:  86 cm/sec LEFT ICA:  Systolic 90 cm/sec, Diastolic 25 cm/sec CCA:  97 cm/sec SYSTOLIC ICA/CCA RATIO:  0.9 ECA:  88 cm/sec Right Brachial SBP: Not acquired Left Brachial SBP: Not acquired RIGHT CAROTID ARTERY: No significant calcified disease of the right common carotid artery. Intermediate waveform maintained. Minimal homogeneous plaque without significant calcifications at the right carotid bifurcation. Low resistance waveform of the right ICA. No significant tortuosity. RIGHT VERTEBRAL ARTERY: Antegrade flow with low resistance waveform. LEFT CAROTID ARTERY: No significant calcified disease of the left common carotid artery. Intermediate waveform maintained. Minimal homogeneous plaque at the left carotid bifurcation without significant calcifications. Low resistance waveform of the left ICA. LEFT VERTEBRAL ARTERY:  Antegrade flow with low resistance  waveform. IMPRESSION: Color duplex indicates minimal homogeneous plaque, with no hemodynamically significant stenosis by duplex criteria in the extracranial cerebrovascular circulation. Signed, Yvone Neu. Loreta Ave, DO [facility] Electronically Signed   By: Gilmer Mor D.O.   On: 04/06/2016 07:56     Management plans discussed with the patient, family and they are in agreement.  CODE STATUS:  Code  Status History    Date Active Date Inactive Code Status Order ID Comments User Context   04/05/2016  4:58 PM 04/06/2016  5:50 PM Full Code [id number]  Ramonita Lab, MD Inpatient      TOTAL TIME TAKING CARE OF THIS PATIENT: 32 minutes.    Shaune Pollack M.D on 04/06/2016 at 6:07 PM  Between 7am to 6pm - Pager - [phone]  After 6pm go to [email or web address] - Social research officer, government  [facility]  Office  [phone]  CC: Primary care physician; Pcp Not In System   Note: This dictation was prepared with Dragon dictation along with smaller phrase technology. Any transcriptional errors that result from this process are unintentional.

## 2016-04-06 NOTE — Progress Notes (Signed)
*  PRELIMINARY RESULTS* Echocardiogram 2D Echocardiogram has been performed.  Cristela Blue 04/06/2016, 11:45 AM

## 2016-04-06 NOTE — Evaluation (Signed)
Physical Therapy Evaluation Patient Details Name: John Spears MRN: [id number] DOB: 1950/07/03 Today's Date: 04/06/2016   History of Present Illness  Pt admitted for positive R lacunar infact with complaints of L side symptoms including numbness/weakness.  Clinical Impression  Pt is a pleasant 66 year old male who was admitted for R lacunar infarct. Pt demonstrates all bed mobility/transfers/ambulation at baseline level. Pt with intact sensation and coordination with finger to nose and RAMP movements on B LE. Pt reports he feels he is back to his normal. Pt does not require any further PT needs at this time. Pt will be dc in house and does not require follow up. RN aware. Will dc current orders.      Follow Up Recommendations No PT follow up    Equipment Recommendations  None recommended by PT    Recommendations for Other Services       Precautions / Restrictions Precautions Precautions: Fall Restrictions Weight Bearing Restrictions: No      Mobility  Bed Mobility Overal bed mobility: Independent             General bed mobility comments: safe technique performed with ease during transitions  Transfers Overall transfer level: Independent Equipment used: None             General transfer comment: transfers performed with upright posture and no AD. Safe technique performed  Ambulation/Gait Ambulation/Gait assistance: Supervision Ambulation Distance (Feet): 40 Feet Assistive device: None Gait Pattern/deviations: WFL(Within Functional Limits)     General Gait Details: ambulated in room with safe technique and no LOB noted during turns. Pt then wished to return back to bed. Reciprocal gait pattern performed   Stairs            Wheelchair Mobility    Modified Rankin (Stroke Patients Only)       Balance Overall balance assessment: Independent                                           Pertinent Vitals/Pain Pain Assessment:  No/denies pain    Home Living Family/patient expects to be discharged to:: Private residence Living Arrangements: Alone Available Help at Discharge: Family (family lives near by) Type of Home: Apartment Home Access: Level entry     Home Layout: One level Home Equipment: None      Prior Function Level of Independence: Independent               Hand Dominance        Extremity/Trunk Assessment   Upper Extremity Assessment Upper Extremity Assessment: Overall WFL for tasks assessed    Lower Extremity Assessment Lower Extremity Assessment: Overall WFL for tasks assessed       Communication   Communication: No difficulties  Cognition Arousal/Alertness: Awake/alert Behavior During Therapy: WFL for tasks assessed/performed Overall Cognitive Status: Within Functional Limits for tasks assessed                      General Comments      Exercises     Assessment/Plan    PT Assessment Patent does not need any further PT services  PT Problem List            PT Treatment Interventions      PT Goals (Current goals can be found in the Care Plan section)  Acute Rehab PT Goals Patient Stated Goal: to go  home PT Goal Formulation: All assessment and education complete, DC therapy Time For Goal Achievement: 01-May-2016 Potential to Achieve Goals: Good    Frequency     Barriers to discharge        Co-evaluation               End of Session   Activity Tolerance: Patient tolerated treatment well Patient left: in bed Nurse Communication: Mobility status         Time: 3818-2993 PT Time Calculation (min) (ACUTE ONLY): 8 min   Charges:   PT Evaluation $PT Eval Low Complexity: 1 Procedure     PT G Codes:        Martia Dalby 05-01-2016, 9:41 AM  Elizabeth Palau, PT, DPT [phone]

## 2016-04-06 NOTE — Evaluation (Signed)
Occupational Therapy Evaluation Patient Details Name: John Spears MRN: [id number] DOB: [birth year] Today's Date: 04/06/2016    History of Present Illness Pt admitted for positive R lacunar infact with complaints of L side symptoms including numbness/weakness.   Clinical Impression   Pt seen for OT evaluation this date. Pt presents with baseline sensation/strength/balance/independent with bed mobility, functional transfers, and UB/LB ADL tasks with no LOB or dizziness reported. Pt was independent at baseline with all ADL, only requiring family to drive him to the store, as he hasn't driven in 5-6 years, otherwise indep with self care, household mgt tasks, and takes a multivitamin daily. Pt eager and safe to return home. No additional OT needs at this time. Will sign off. Please re-consult if change in pt status occurs.     Follow Up Recommendations  No OT follow up    Equipment Recommendations  None recommended by OT    Recommendations for Other Services       Precautions / Restrictions Precautions Precautions: Fall Restrictions Weight Bearing Restrictions: No      Mobility Bed Mobility Overal bed mobility: Independent             General bed mobility comments: safe technique, no dizziness reported, no LOB  Transfers Overall transfer level: Independent Equipment used: None             General transfer comment: safe technique, no dizziness reported, no LOB    Balance Overall balance assessment: Independent                                          ADL Overall ADL's : At baseline;Independent                                       General ADL Comments: Pt able to perform all bed mobility, functional transfers, and UB/LB dressing tasks independently with no LOB     Vision Vision Assessment?: No apparent visual deficits   Perception     Praxis Praxis Praxis tested?: Within functional limits    Pertinent Vitals/Pain  Pain Assessment: No/denies pain     Hand Dominance     Extremity/Trunk Assessment Upper Extremity Assessment Upper Extremity Assessment: Overall WFL for tasks assessed   Lower Extremity Assessment Lower Extremity Assessment: Overall WFL for tasks assessed   Cervical / Trunk Assessment Cervical / Trunk Assessment: Normal   Communication Communication Communication: No difficulties   Cognition Arousal/Alertness: Awake/alert Behavior During Therapy: WFL for tasks assessed/performed Overall Cognitive Status: Within Functional Limits for tasks assessed                     General Comments       Exercises       Shoulder Instructions      Home Living Family/patient expects to be discharged to:: Private residence Living Arrangements: Alone Available Help at Discharge: Family;Available PRN/intermittently (family lives nearby) Type of Home: Apartment Home Access: Stairs to enter Secretary/administrator of Steps: 4 STE Entrance Stairs-Rails: Left Home Layout: One level     Bathroom Shower/Tub: Tub/shower unit Shower/tub characteristics: Engineer, building services: Standard Bathroom Accessibility: Yes How Accessible: Accessible via walker Home Equipment: None          Prior Functioning/Environment Level of Independence: Independent  Comments: Pt reports indep with ADL, only takes a vitamin at home, indep with cooking, cleaning; family will drive him to the store (hasn't driven in 5-6 years)        OT Problem List:     OT Treatment/Interventions:      OT Goals(Current goals can be found in the care plan section) Acute Rehab OT Goals Patient Stated Goal: to go home OT Goal Formulation: With patient Time For Goal Achievement: 04/06/16 Potential to Achieve Goals: Good  OT Frequency:     Barriers to D/C:            Co-evaluation              End of Session    Activity Tolerance: Patient tolerated treatment well Patient left: in  bed;with call bell/phone within reach;with family/visitor present   Time: 5631-4970 OT Time Calculation (min): 9 min Charges:  OT General Charges $OT Visit: 1 Procedure OT Evaluation $OT Eval Low Complexity: 1 Procedure G-Codes:    Eliezer Bottom, OTR/L 04/06/2016, 11:14 AM

## 2016-04-06 NOTE — Progress Notes (Signed)
PHARMACIST - PHYSICIAN COMMUNICATION  CONCERNING: IV to Oral Route Change Policy  RECOMMENDATION: This patient is receiving THIAMINE by the intravenous route.  Based on criteria approved by the Pharmacy and Therapeutics Committee, the intravenous medication(s) is/are being converted to the equivalent oral dose form(s).   DESCRIPTION: These criteria include:  The patient is eating (either orally or via tube) and/or has been taking other orally administered medications for a least 24 hours  The patient has no evidence of active gastrointestinal bleeding or impaired GI absorption (gastrectomy, short bowel, patient on TNA or NPO).  If you have questions about this conversion, please contact the Pharmacy Department  []   [phone] )  ( [phone] [x]   [phone] )  [facility] []   [phone] )  [facility] []   [phone] )  [facility] []   [phone] )  [facility], [facility] 04/06/2016 1:54 PM

## 2016-04-06 NOTE — Consult Note (Signed)
Referring Physi3cian: Dr. Imogene Burn     Chief Complaint: L sided numbness   HPI: John Spears is an 66 y.o. male with a known history of Hypertension not on any home medications is presenting to the ED with a chief complaint of left facial numbness and left upper extremity numbness associated with shortness of breath. Patient was not taking any blood pressure medications for the past 6-8 years. Admits to drinking alcohol almost every day in a week MRI shows right thalamus ischemic stroke.    Symptoms improved.      Past Medical History:  Diagnosis Date  . Hypertension     Past Surgical History:  Procedure Laterality Date  . JOINT REPLACEMENT      No family history on file. Social History:  reports that he has quit smoking. He has never used smokeless tobacco. He reports that he drinks about 16.8 oz of alcohol per week . He reports that he does not use drugs.  Allergies: No Known Allergies  Medications: I have reviewed the patient's current medications.  ROS: History obtained from the patient  General ROS: negative for - chills, fatigue, fever, night sweats, weight gain or weight loss Psychological ROS: negative for - behavioral disorder, hallucinations, memory difficulties, mood swings or suicidal ideation Ophthalmic ROS: negative for - blurry vision, double vision, eye pain or loss of vision ENT ROS: negative for - epistaxis, nasal discharge, oral lesions, sore throat, tinnitus or vertigo Allergy and Immunology ROS: negative for - hives or itchy/watery eyes Hematological and Lymphatic ROS: negative for - bleeding problems, bruising or swollen lymph nodes Endocrine ROS: negative for - galactorrhea, hair pattern changes, polydipsia/polyuria or temperature intolerance Respiratory ROS: negative for - cough, hemoptysis, shortness of breath or wheezing Cardiovascular ROS: negative for - chest pain, dyspnea on exertion, edema or irregular heartbeat Gastrointestinal ROS: negative for -  abdominal pain, diarrhea, hematemesis, nausea/vomiting or stool incontinence Genito-Urinary ROS: negative for - dysuria, hematuria, incontinence or urinary frequency/urgency Musculoskeletal ROS: negative for - joint swelling or muscular weakness Neurological ROS: as noted in HPI Dermatological ROS: negative for rash and skin lesion changes  Physical Examination: Blood pressure (!) 169/87, pulse 81, temperature 97.7 F (36.5 C), temperature source Oral, resp. rate (!) 21, height 5\' 11"  (1.803 m), weight 81.6 kg (180 lb), SpO2 98 %.  HEENT-  Normocephalic, no lesions, without obvious abnormality.  Normal external eye and conjunctiva.  Normal TM's bilaterally.  Normal auditory canals and external ears. Normal external nose, mucus membranes and septum.  Normal pharynx. Cardiovascular- regular rate and rhythm, S1, S2 normal, no murmur, click, rub or gallop, pulses palpable throughout   Lungs- Heart exam - S1, S2 normal, no murmur, no gallop, rate regular Abdomen- soft, non-tender; bowel sounds normal; no masses,  no organomegaly Extremities- less then 2 second capillary refill Lymph-no adenopathy palpable Musculoskeletal-no joint tenderness, deformity or swelling Skin-warm and dry, no hyperpigmentation, vitiligo, or suspicious lesions  Neurological Examination   Mental Status: Alert, oriented, thought content appropriate.  Speech fluent without evidence of aphasia.  Able to follow 3 step commands without difficulty. Cranial Nerves: II: Discs flat bilaterally; Visual fields grossly normal, pupils equal, round, reactive to light and accommodation III,IV, VI: ptosis not present, extra-ocular motions intact bilaterally V,VII: smile symmetric, facial light touch sensation normal bilaterally VIII: hearing normal bilaterally IX,X: gag reflex present XI: bilateral shoulder shrug XII: midline tongue extension Motor: Right : Upper extremity   5/5    Left:     Upper extremity  5/5  Lower extremity    5/5     Lower extremity   5/5 Tone and bulk:normal tone throughout; no atrophy noted Sensory: numbness L side of face.  Deep Tendon Reflexes: 1+ and symmetric throughout Plantars: Right: downgoing   Left: downgoing Cerebellar: normal finger-to-nose, normal rapid alternating movements and normal heel-to-shin test Gait: normal gait and station      Laboratory Studies:  Basic Metabolic Panel:  Recent Labs Lab 04/05/16 0520  NA 138  K 3.4*  CL 100*  CO2 26  GLUCOSE 98  BUN 8  CREATININE 0.85  CALCIUM 9.4    Liver Function Tests:  Recent Labs Lab 04/05/16 0520  AST 31  ALT 12*  ALKPHOS 73  BILITOT 0.8  PROT 8.3*  ALBUMIN 4.6   No results for input(s): LIPASE, AMYLASE in the last 168 hours. No results for input(s): AMMONIA in the last 168 hours.  CBC:  Recent Labs Lab 04/05/16 0520  WBC 7.4  HGB 15.0  HCT 43.8  MCV 90.4  PLT 202    Cardiac Enzymes:  Recent Labs Lab 04/05/16 0520  TROPONINI <0.03    BNP: Invalid input(s): POCBNP  CBG: No results for input(s): GLUCAP in the last 168 hours.  Microbiology: No results found for this or any previous visit.  Coagulation Studies: No results for input(s): LABPROT, INR in the last 72 hours.  Urinalysis: No results for input(s): COLORURINE, LABSPEC, PHURINE, GLUCOSEU, HGBUR, BILIRUBINUR, KETONESUR, PROTEINUR, UROBILINOGEN, NITRITE, LEUKOCYTESUR in the last 168 hours.  Invalid input(s): APPERANCEUR  Lipid Panel:    Component Value Date/Time   CHOL 279 (H) 04/06/2016 0443   TRIG 67 04/06/2016 0443   HDL 91 04/06/2016 0443   CHOLHDL 3.1 04/06/2016 0443   VLDL 13 04/06/2016 0443   LDLCALC 175 (H) 04/06/2016 0443    HgbA1C: No results found for: HGBA1C  Urine Drug Screen:  No results found for: LABOPIA, COCAINSCRNUR, LABBENZ, AMPHETMU, THCU, LABBARB  Alcohol Level: No results for input(s): ETH in the last 168 hours.  Other results: EKG: normal EKG, normal sinus rhythm, unchanged from previous  tracings.  Imaging: Dg Chest 2 View  Result Date: 04/05/2016 CLINICAL DATA:  Shortness of breath for a few days, worse tonight. Cough. Numbness in the left face and left arm. EXAM: CHEST  2 VIEW COMPARISON:  03/09/2005 FINDINGS: The heart size and mediastinal contours are within normal limits. Both lungs are clear. The visualized skeletal structures are unremarkable. IMPRESSION: No active cardiopulmonary disease. Electronically Signed   By: Burman Nieves M.D.   On: 04/05/2016 05:59   Ct Head Wo Contrast  Result Date: 04/05/2016 CLINICAL DATA:  Shortness of breath and diaphoresis. Numbness to the left side of face and left arm. EXAM: CT HEAD WITHOUT CONTRAST TECHNIQUE: Contiguous axial images were obtained from the base of the skull through the vertex without intravenous contrast. COMPARISON:  None. FINDINGS: Brain: Mild diffuse cerebral atrophy. Mild ventricular dilatation consistent with central atrophy. Scattered low-attenuation changes in the deep white matter consistent with small vessel ischemia. No mass effect or midline shift. No abnormal extra-axial fluid collections. Gray-white matter junctions are distinct. Basal cisterns are not effaced. Vascular: No hyperdense vessel or unexpected calcification. Skull: Normal. Negative for fracture or focal lesion. Sinuses/Orbits: Mild mucosal thickening in the paranasal sinuses. Old appearing fracture deformity of the right medial orbital wall. Mastoid air cells are not opacified. Other: None. IMPRESSION: No acute intracranial abnormalities. Mild chronic atrophy and small vessel ischemic changes. Electronically Signed   By:  Burman Nieves M.D.   On: 04/05/2016 06:21   Mr Brain Wo Contrast  Result Date: 04/05/2016 CLINICAL DATA:  Numbness in LEFT arm and LEFT side of face beginning earlier today. Chronic hypertension, not currently treated. Elevated blood pressure on admission. EXAM: MRI HEAD WITHOUT CONTRAST TECHNIQUE: Multiplanar, multiecho pulse  sequences of the brain and surrounding structures were obtained without intravenous contrast. COMPARISON:  CT head at 6 a.m., earlier today FINDINGS: Brain: There is a 3 mm area of restricted diffusion in the RIGHT thalamus representing an acute lacunar infarct. No hemorrhage, mass lesion, hydrocephalus, or extra-axial fluid. Premature for age cerebral and cerebellar atrophy. Mild to moderate T2 and FLAIR hyperintensity in the periventricular and subcortical white matter, likely representing chronic microvascular ischemic change. There is a chronic lacunar infarction of the RIGHT caudate nucleus. Vascular: Normal flow voids. Skull and upper cervical spine: Normal marrow signal. Sinuses/Orbits: No acute findings. There appears to be a chronic blowout injury of the RIGHT orbit. Other: None. Compared with earlier CT, the infarct is not visible. IMPRESSION: Acute 3 mm lacunar infarct of the RIGHT thalamus is consistent with reported LEFT-sided sensory symptoms. Atrophy and small vessel disease. Electronically Signed   By: Elsie Stain M.D.   On: 04/05/2016 11:33   US Carotid Bilateral (at [facility] Only)  Result Date: 04/06/2016 CLINICAL DATA:  66 year old male with a history of TIA. Cardiovascular risk factors include hypertension, known prior stroke/TIA, tobacco use EXAM: BILATERAL CAROTID DUPLEX ULTRASOUND TECHNIQUE: Wallace Cullens scale imaging, color Doppler and duplex ultrasound were performed of bilateral carotid and vertebral arteries in the neck. COMPARISON:  MR 04/05/2016 FINDINGS: Criteria: Quantification of carotid stenosis is based on velocity parameters that correlate the residual internal carotid diameter with NASCET-based stenosis levels, using the diameter of the distal internal carotid lumen as the denominator for stenosis measurement. The following velocity measurements were obtained: RIGHT ICA:  Systolic 66 cm/sec, Diastolic 17 cm/sec CCA:  89 cm/sec SYSTOLIC ICA/CCA RATIO:  0.8 ECA:  86 cm/sec LEFT  ICA:  Systolic 90 cm/sec, Diastolic 25 cm/sec CCA:  97 cm/sec SYSTOLIC ICA/CCA RATIO:  0.9 ECA:  88 cm/sec Right Brachial SBP: Not acquired Left Brachial SBP: Not acquired RIGHT CAROTID ARTERY: No significant calcified disease of the right common carotid artery. Intermediate waveform maintained. Minimal homogeneous plaque without significant calcifications at the right carotid bifurcation. Low resistance waveform of the right ICA. No significant tortuosity. RIGHT VERTEBRAL ARTERY: Antegrade flow with low resistance waveform. LEFT CAROTID ARTERY: No significant calcified disease of the left common carotid artery. Intermediate waveform maintained. Minimal homogeneous plaque at the left carotid bifurcation without significant calcifications. Low resistance waveform of the left ICA. LEFT VERTEBRAL ARTERY:  Antegrade flow with low resistance waveform. IMPRESSION: Color duplex indicates minimal homogeneous plaque, with no hemodynamically significant stenosis by duplex criteria in the extracranial cerebrovascular circulation. Signed, Yvone Neu. Loreta Ave, DO [facility] Electronically Signed   By: Gilmer Mor D.O.   On: 04/06/2016 07:56    Assessment: 66 y.o. male with a known history of Hypertension not on any home medications is presenting to the ED with a chief complaint of left facial numbness and left upper extremity numbness associated with shortness of breath. Patient was not taking any blood pressure medications for the past 6-8 years. Admits to drinking alcohol almost every day in a week MRI shows right thalamus ischemic stroke.     Stroke Risk Factors - diabetes mellitus, hypertension and non compliance with medications  Plan: 1. Change ASA to oral 2. Finish stroke work up below and likely d/c planning as only L facial numbness now 3. PT consult, OT consult, Speech consult 4. Echocardiogram 5. Carotid dopplers 6. Prophylactic  therapy-Antiplatelet med: Aspirin - dose 325 daily  7. NPO until RN stroke swallow screen 8. Telemetry monitoring 9. Frequent neuro checks    04/06/2016, 11:02 AM

## 2016-04-06 NOTE — Progress Notes (Signed)
Pt has been discharged home. Discharged papers given and explained to pt and sister. Both verbalized understanding. Meds and f/u appointment reviewed with pt. RX given. Escorted in a wheelchair.

## 2016-04-06 NOTE — Progress Notes (Signed)
SLP Cancellation Note  Patient Details Name: John Spears MRN: [id number] DOB: 01/31/1951   Cancelled treatment:       Reason Eval/Treat Not Completed: SLP screened, no needs identified, will sign off (Chart reviewed; NSG consulted). Pt and family denied any trouble with speech/lanuage this date. Pt seen in room with lunchtime meal; also denied any difficulty with swallowing. Pt's wife reports pt being back at his baseline; denied any further concerns for speech/lanauge. NSG consulted and denied any concerns. No further skilled ST services indicated; NSG to re-consult if any change in status.    Ardelia Mems, B.S Graduate Clinician  04/06/2016, 12:03 PM   This information has been reviewed and agreed upon by this supervising clinician.  Jerilynn Som, MS, CCC-SLP

## 2016-04-06 NOTE — Discharge Instructions (Signed)
Heart healthy diet. Alcohol cessation.

## 2016-04-07 LAB — HEMOGLOBIN A1C
HEMOGLOBIN A1C: 5.1 % (ref 4.8–5.6)
Mean Plasma Glucose: 100 mg/dL

## 2016-06-11 ENCOUNTER — Ambulatory Visit: Payer: Medicare Other | Admitting: Podiatry

## 2016-10-21 ENCOUNTER — Encounter: Payer: Self-pay | Admitting: *Deleted

## 2016-10-21 ENCOUNTER — Emergency Department: Payer: Medicare Other

## 2016-10-21 DIAGNOSIS — Y908 Blood alcohol level of 240 mg/100 ml or more: Secondary | ICD-10-CM | POA: Diagnosis not present

## 2016-10-21 DIAGNOSIS — Z79899 Other long term (current) drug therapy: Secondary | ICD-10-CM | POA: Insufficient documentation

## 2016-10-21 DIAGNOSIS — R079 Chest pain, unspecified: Secondary | ICD-10-CM | POA: Diagnosis present

## 2016-10-21 DIAGNOSIS — Z87891 Personal history of nicotine dependence: Secondary | ICD-10-CM | POA: Diagnosis not present

## 2016-10-21 DIAGNOSIS — K0501 Acute gingivitis, non-plaque induced: Secondary | ICD-10-CM | POA: Diagnosis not present

## 2016-10-21 DIAGNOSIS — Z7982 Long term (current) use of aspirin: Secondary | ICD-10-CM | POA: Diagnosis not present

## 2016-10-21 DIAGNOSIS — K029 Dental caries, unspecified: Secondary | ICD-10-CM | POA: Insufficient documentation

## 2016-10-21 DIAGNOSIS — F10920 Alcohol use, unspecified with intoxication, uncomplicated: Secondary | ICD-10-CM | POA: Insufficient documentation

## 2016-10-21 DIAGNOSIS — I10 Essential (primary) hypertension: Secondary | ICD-10-CM | POA: Insufficient documentation

## 2016-10-21 DIAGNOSIS — Z8673 Personal history of transient ischemic attack (TIA), and cerebral infarction without residual deficits: Secondary | ICD-10-CM | POA: Insufficient documentation

## 2016-10-21 LAB — CBC
HCT: 37.4 % — ABNORMAL LOW (ref 40.0–52.0)
Hemoglobin: 12.7 g/dL — ABNORMAL LOW (ref 13.0–18.0)
MCH: 32.5 pg (ref 26.0–34.0)
MCHC: 34 g/dL (ref 32.0–36.0)
MCV: 95.6 fL (ref 80.0–100.0)
PLATELETS: 172 10*3/uL (ref 150–440)
RBC: 3.91 MIL/uL — AB (ref 4.40–5.90)
RDW: 14.5 % (ref 11.5–14.5)
WBC: 7.9 10*3/uL (ref 3.8–10.6)

## 2016-10-21 LAB — BASIC METABOLIC PANEL
Anion gap: 14 (ref 5–15)
BUN: 23 mg/dL — AB (ref 6–20)
CHLORIDE: 98 mmol/L — AB (ref 101–111)
CO2: 29 mmol/L (ref 22–32)
CREATININE: 1.5 mg/dL — AB (ref 0.61–1.24)
Calcium: 8.6 mg/dL — ABNORMAL LOW (ref 8.9–10.3)
GFR, EST AFRICAN AMERICAN: 55 mL/min — AB (ref >60)
GFR, EST NON AFRICAN AMERICAN: 47 mL/min — AB (ref >60)
Glucose, Bld: 111 mg/dL — ABNORMAL HIGH (ref 65–99)
Potassium: 3 mmol/L — ABNORMAL LOW (ref 3.5–5.1)
Sodium: 141 mmol/L (ref 135–145)

## 2016-10-21 LAB — ETHANOL: ALCOHOL ETHYL (B): 374 mg/dL — AB (ref <5)

## 2016-10-21 LAB — TROPONIN I

## 2016-10-21 NOTE — ED Triage Notes (Signed)
Pt to triage via wheelchair. Pt has chest pain and left jaw pain for 3 days.  No n/v/d.  No diaphoresis.  No sob. States etoh use.  Pt alert.

## 2016-10-22 ENCOUNTER — Emergency Department
Admission: EM | Admit: 2016-10-22 | Discharge: 2016-10-22 | Disposition: A | Discharge status: Home / Self Care | Payer: Medicare Other | Attending: Emergency Medicine | Admitting: Emergency Medicine

## 2016-10-22 DIAGNOSIS — K029 Dental caries, unspecified: Secondary | ICD-10-CM

## 2016-10-22 DIAGNOSIS — I10 Essential (primary) hypertension: Secondary | ICD-10-CM | POA: Insufficient documentation

## 2016-10-22 DIAGNOSIS — Z8673 Personal history of transient ischemic attack (TIA), and cerebral infarction without residual deficits: Secondary | ICD-10-CM | POA: Insufficient documentation

## 2016-10-22 DIAGNOSIS — F10920 Alcohol use, unspecified with intoxication, uncomplicated: Secondary | ICD-10-CM | POA: Diagnosis not present

## 2016-10-22 DIAGNOSIS — K0510 Chronic gingivitis, plaque induced: Secondary | ICD-10-CM

## 2016-10-22 DIAGNOSIS — F1020 Alcohol dependence, uncomplicated: Secondary | ICD-10-CM | POA: Insufficient documentation

## 2016-10-22 DIAGNOSIS — R079 Chest pain, unspecified: Secondary | ICD-10-CM

## 2016-10-22 LAB — TROPONIN I: Troponin I: <0.03 ng/mL (ref <0.03)

## 2016-10-22 MED ORDER — AMOXICILLIN-POT CLAVULANATE 875-125 MG PO TABS: 1.0000 | ORAL_TABLET | Freq: Two times a day (BID) | ORAL | 0 refills | Status: AC

## 2016-10-22 NOTE — ED Provider Notes (Signed)
[facility] Emergency Department Provider Note   First MD Initiated Contact with Patient 10/22/16 0136     (approximate)  I have reviewed the triage vital signs and the nursing notes.   HISTORY  Chief Complaint Chest Pain    HPI John Spears is a 66 y.o. male with below list of chronic medical conditions presents to the emergency department with toothache" for a while, central chest discomfort that is nonradiating. Patient states the chest pain is now resolved. Patient denies any dyspnea no laxity pain or swelling. Patient does admit to EtOH ingestion tonight and states that he drinks daily.   Past Medical History:  Diagnosis Date  . Hypertension     Patient Active Problem List   Diagnosis Date Noted  . Acute CVA (cerebrovascular accident) (HCC) 04/05/2016    Past Surgical History:  Procedure Laterality Date  . JOINT REPLACEMENT      Prior to Admission medications   Medication Sig Start Date End Date Taking? Authorizing Provider  amLODipine (NORVASC) 10 MG tablet Take 1 tablet (10 mg total) by mouth daily. 04/06/16   Shaune Pollack, MD  aspirin 325 MG tablet Take 1 tablet (325 mg total) by mouth daily. 04/07/16   Shaune Pollack, MD  atorvastatin (LIPITOR) 40 MG tablet Take 1 tablet (40 mg total) by mouth daily at 6 PM. 04/06/16   Shaune Pollack, MD  hydrochlorothiazide (HYDRODIURIL) 25 MG tablet Take 1 tablet (25 mg total) by mouth daily. 04/06/16   Shaune Pollack, MD  metoprolol (LOPRESSOR) 50 MG tablet Take 1 tablet (50 mg total) by mouth 2 (two) times daily. 04/06/16   Shaune Pollack, MD    Allergies No known drug allergies  No family history on file.  Social History Social History  Substance Use Topics  . Smoking status: Former Games developer  . Smokeless tobacco: Never Used  . Alcohol use 16.8 oz/week    28 Cans of beer per week     Comment: drinks 1/5 per week or more    Review of Systems Constitutional: No fever/chills Eyes: No visual changes. ENT: No  sore throat.Positive for dental pain Cardiovascular: Positive for chest pain. Respiratory: Denies shortness of breath. Gastrointestinal: No abdominal pain.  No nausea, no vomiting.  No diarrhea.  No constipation. Genitourinary: Negative for dysuria. Musculoskeletal: Negative for neck pain.  Negative for back pain. Integumentary: Negative for rash. Neurological: Negative for headaches, focal weakness or numbness.   ____________________________________________   PHYSICAL EXAM:  VITAL SIGNS: ED Triage Vitals  Enc Vitals Group     BP 10/21/16 2311 134/75     Pulse Rate 10/21/16 2311 70     Resp 10/21/16 2311 18     Temp 10/21/16 2311 98.6 F (37 C)     Temp Source 10/21/16 2311 Oral     SpO2 10/21/16 2311 96 %     Weight 10/21/16 2309 81.6 kg (180 lb)     Height 10/21/16 2309 1.803 m (5\' 11" )     Head Circumference --      Peak Flow --      Pain Score 10/21/16 2309 10     Pain Loc --      Pain Edu? --      Excl. in GC? --     Constitutional: Alert and oriented. Appears intoxicated Eyes: Conjunctivae are normal. PERRL. EOMI. Head: Atraumatic. Mouth/Throat: Mucous membranes are moist. Oropharynx non-erythematous. Multiple dental caries noted on the mandible Neck: No stridor.   Cardiovascular: Normal rate, regular  rhythm. Good peripheral circulation. Grossly normal heart sounds. Respiratory: Normal respiratory effort.  No retractions. Lungs CTAB. Gastrointestinal: Soft and nontender. No distention.  Musculoskeletal: No lower extremity tenderness nor edema. No gross deformities of extremities. Neurologic:  Normal speech and language. No gross focal neurologic deficits are appreciated.  Skin:  Skin is warm, dry and intact. No rash noted. Psychiatric: Mood and affect are normal. Appears intoxicated  ____________________________________________   LABS (all labs ordered are listed, but only abnormal results are displayed)  Labs Reviewed  BASIC METABOLIC PANEL - Abnormal;  Notable for the following:       Result Value   Potassium 3.0 (*)    Chloride 98 (*)    Glucose, Bld 111 (*)    BUN 23 (*)    Creatinine, Ser 1.50 (*)    Calcium 8.6 (*)    GFR calc non Af Amer 47 (*)    GFR calc Af Amer 55 (*)    All other components within normal limits  CBC - Abnormal; Notable for the following:    RBC 3.91 (*)    Hemoglobin 12.7 (*)    HCT 37.4 (*)    All other components within normal limits  ETHANOL - Abnormal; Notable for the following:    Alcohol, Ethyl (B) 374 (*)    All other components within normal limits  TROPONIN I  TROPONIN I    RADIOLOGY I, Harman N Sedonia Kitner, personally viewed and evaluated these images (plain radiographs) as part of my medical decision making, as well as reviewing the written report by the radiologist.  Dg Chest 2 View  Result Date: 10/21/2016 CLINICAL DATA:  Chest pain EXAM: CHEST  2 VIEW COMPARISON:  04/05/2016 FINDINGS: The heart size and mediastinal contours are within normal limits. Both lungs are clear. The visualized skeletal structures are unremarkable. IMPRESSION: No active cardiopulmonary disease. Electronically Signed   By: Jasmine Pang M.D.   On: 10/21/2016 23:33    Procedures   ____________________________________________   INITIAL IMPRESSION / ASSESSMENT AND PLAN / ED COURSE  Pertinent labs & imaging results that were available during my care of the patient were reviewed by me and considered in my medical decision making (see chart for details).  66 year old male presenting with multiple medical complaints including chest pain. Patient's EKG revealed no evidence of ischemia or infarction. Patient's laboratory data including troponin negative 2. Patient appeared to be intoxicated and admitted to EtOH ingestion. Alcohol level noted to be 374. Patient noted to have multiple dental caries and a such will be prescribed amoxicillin. Patient will be discharged home in the custody of his sister. I offered alcohol  detoxification which she refused at this time      ____________________________________________  FINAL CLINICAL IMPRESSION(S) / ED DIAGNOSES  Final diagnoses:  Alcoholic intoxication without complication (HCC)  Dental caries  Gingivitis  Chest pain, unspecified type     MEDICATIONS GIVEN DURING THIS VISIT:  Medications - No data to display   NEW OUTPATIENT MEDICATIONS STARTED DURING THIS VISIT:  New Prescriptions   No medications on file    Modified Medications   No medications on file    Discontinued Medications   No medications on file     Note:  This document was prepared using Dragon voice recognition software and may include unintentional dictation errors.    Darci Current, MD 10/22/16 [id number]

## 2016-10-22 NOTE — ED Notes (Addendum)
Patient states he drank so much because he teeth are hurting. Patient laying on the stretcher. Even unlabored respirations. NAD. Patient states chest pain is almost gone. Patient states drinks every day.

## 2016-10-22 NOTE — ED Notes (Signed)
Patient verbalizes understanding of d/c instructions and follow-up. VS stable and pain controlled per patient.  Patient in NAD at time of d/c and denies further concerns regarding this visit. Patient stable at the time of departure from the unit, departing unit by the safest and most appropriate manner per that patients condition and limitations. Patient advised to return to the ED at any time for emergent concerns, or for new/worsening symptoms.   

## 2016-10-22 NOTE — ED Notes (Signed)
Charge nurse notified of pt's ETOH results

## 2019-02-25 DIAGNOSIS — R262 Difficulty in walking, not elsewhere classified: Secondary | ICD-10-CM | POA: Diagnosis not present

## 2019-02-25 DIAGNOSIS — Z9181 History of falling: Secondary | ICD-10-CM | POA: Diagnosis not present

## 2019-02-25 DIAGNOSIS — M6281 Muscle weakness (generalized): Secondary | ICD-10-CM | POA: Diagnosis not present

## 2019-02-25 DIAGNOSIS — M069 Rheumatoid arthritis, unspecified: Secondary | ICD-10-CM | POA: Diagnosis not present

## 2019-03-07 ENCOUNTER — Emergency Department: Payer: Medicare HMO

## 2019-03-07 ENCOUNTER — Inpatient Hospital Stay
Admission: EM | Admit: 2019-03-07 | Discharge: 2019-03-14 | DRG: 565 | Disposition: A | Discharge status: Skilled Nursing Facility | Payer: Medicare HMO | Attending: Internal Medicine | Admitting: Internal Medicine

## 2019-03-07 ENCOUNTER — Encounter: Payer: Self-pay | Admitting: Emergency Medicine

## 2019-03-07 ENCOUNTER — Other Ambulatory Visit: Payer: Self-pay

## 2019-03-07 DIAGNOSIS — S79911A Unspecified injury of right hip, initial encounter: Secondary | ICD-10-CM | POA: Diagnosis not present

## 2019-03-07 DIAGNOSIS — E872 Acidosis: Secondary | ICD-10-CM | POA: Diagnosis present

## 2019-03-07 DIAGNOSIS — Z7982 Long term (current) use of aspirin: Secondary | ICD-10-CM | POA: Diagnosis not present

## 2019-03-07 DIAGNOSIS — R29898 Other symptoms and signs involving the musculoskeletal system: Secondary | ICD-10-CM | POA: Diagnosis not present

## 2019-03-07 DIAGNOSIS — T796XXA Traumatic ischemia of muscle, initial encounter: Secondary | ICD-10-CM | POA: Diagnosis not present

## 2019-03-07 DIAGNOSIS — R079 Chest pain, unspecified: Secondary | ICD-10-CM | POA: Diagnosis not present

## 2019-03-07 DIAGNOSIS — I1310 Hypertensive heart and chronic kidney disease without heart failure, with stage 1 through stage 4 chronic kidney disease, or unspecified chronic kidney disease: Secondary | ICD-10-CM | POA: Diagnosis not present

## 2019-03-07 DIAGNOSIS — Z96641 Presence of right artificial hip joint: Secondary | ICD-10-CM | POA: Diagnosis present

## 2019-03-07 DIAGNOSIS — E639 Nutritional deficiency, unspecified: Secondary | ICD-10-CM | POA: Diagnosis present

## 2019-03-07 DIAGNOSIS — S8991XA Unspecified injury of right lower leg, initial encounter: Secondary | ICD-10-CM | POA: Diagnosis not present

## 2019-03-07 DIAGNOSIS — S7001XA Contusion of right hip, initial encounter: Secondary | ICD-10-CM

## 2019-03-07 DIAGNOSIS — D631 Anemia in chronic kidney disease: Secondary | ICD-10-CM | POA: Diagnosis present

## 2019-03-07 DIAGNOSIS — Z20822 Contact with and (suspected) exposure to covid-19: Secondary | ICD-10-CM | POA: Diagnosis not present

## 2019-03-07 DIAGNOSIS — N181 Chronic kidney disease, stage 1: Secondary | ICD-10-CM | POA: Diagnosis present

## 2019-03-07 DIAGNOSIS — M6281 Muscle weakness (generalized): Secondary | ICD-10-CM | POA: Diagnosis not present

## 2019-03-07 DIAGNOSIS — R5381 Other malaise: Secondary | ICD-10-CM | POA: Diagnosis not present

## 2019-03-07 DIAGNOSIS — M25561 Pain in right knee: Secondary | ICD-10-CM | POA: Diagnosis not present

## 2019-03-07 DIAGNOSIS — S79912A Unspecified injury of left hip, initial encounter: Secondary | ICD-10-CM | POA: Diagnosis not present

## 2019-03-07 DIAGNOSIS — R531 Weakness: Secondary | ICD-10-CM

## 2019-03-07 DIAGNOSIS — S199XXA Unspecified injury of neck, initial encounter: Secondary | ICD-10-CM | POA: Diagnosis not present

## 2019-03-07 DIAGNOSIS — M069 Rheumatoid arthritis, unspecified: Secondary | ICD-10-CM | POA: Diagnosis present

## 2019-03-07 DIAGNOSIS — Z743 Need for continuous supervision: Secondary | ICD-10-CM | POA: Diagnosis not present

## 2019-03-07 DIAGNOSIS — Z8673 Personal history of transient ischemic attack (TIA), and cerebral infarction without residual deficits: Secondary | ICD-10-CM | POA: Diagnosis not present

## 2019-03-07 DIAGNOSIS — D6959 Other secondary thrombocytopenia: Secondary | ICD-10-CM | POA: Diagnosis present

## 2019-03-07 DIAGNOSIS — M6282 Rhabdomyolysis: Secondary | ICD-10-CM | POA: Diagnosis not present

## 2019-03-07 DIAGNOSIS — D649 Anemia, unspecified: Secondary | ICD-10-CM | POA: Diagnosis not present

## 2019-03-07 DIAGNOSIS — M545 Low back pain: Secondary | ICD-10-CM | POA: Diagnosis not present

## 2019-03-07 DIAGNOSIS — Z87891 Personal history of nicotine dependence: Secondary | ICD-10-CM | POA: Diagnosis not present

## 2019-03-07 DIAGNOSIS — W19XXXA Unspecified fall, initial encounter: Secondary | ICD-10-CM | POA: Diagnosis not present

## 2019-03-07 DIAGNOSIS — Z9181 History of falling: Secondary | ICD-10-CM | POA: Diagnosis not present

## 2019-03-07 DIAGNOSIS — S0990XA Unspecified injury of head, initial encounter: Secondary | ICD-10-CM | POA: Diagnosis not present

## 2019-03-07 DIAGNOSIS — R4182 Altered mental status, unspecified: Secondary | ICD-10-CM | POA: Diagnosis not present

## 2019-03-07 DIAGNOSIS — M48061 Spinal stenosis, lumbar region without neurogenic claudication: Secondary | ICD-10-CM | POA: Diagnosis not present

## 2019-03-07 DIAGNOSIS — I2510 Atherosclerotic heart disease of native coronary artery without angina pectoris: Secondary | ICD-10-CM | POA: Diagnosis not present

## 2019-03-07 DIAGNOSIS — I10 Essential (primary) hypertension: Secondary | ICD-10-CM | POA: Diagnosis not present

## 2019-03-07 DIAGNOSIS — M25552 Pain in left hip: Secondary | ICD-10-CM | POA: Diagnosis not present

## 2019-03-07 DIAGNOSIS — W1830XA Fall on same level, unspecified, initial encounter: Secondary | ICD-10-CM | POA: Diagnosis present

## 2019-03-07 DIAGNOSIS — Z7401 Bed confinement status: Secondary | ICD-10-CM | POA: Diagnosis not present

## 2019-03-07 DIAGNOSIS — S3992XA Unspecified injury of lower back, initial encounter: Secondary | ICD-10-CM | POA: Diagnosis not present

## 2019-03-07 DIAGNOSIS — M2550 Pain in unspecified joint: Secondary | ICD-10-CM | POA: Diagnosis not present

## 2019-03-07 DIAGNOSIS — N182 Chronic kidney disease, stage 2 (mild): Secondary | ICD-10-CM | POA: Diagnosis not present

## 2019-03-07 DIAGNOSIS — R7401 Elevation of levels of liver transaminase levels: Secondary | ICD-10-CM | POA: Diagnosis not present

## 2019-03-07 DIAGNOSIS — R778 Other specified abnormalities of plasma proteins: Secondary | ICD-10-CM | POA: Diagnosis present

## 2019-03-07 DIAGNOSIS — E8729 Other acidosis: Secondary | ICD-10-CM

## 2019-03-07 DIAGNOSIS — D696 Thrombocytopenia, unspecified: Secondary | ICD-10-CM | POA: Diagnosis not present

## 2019-03-07 DIAGNOSIS — M79651 Pain in right thigh: Secondary | ICD-10-CM | POA: Diagnosis not present

## 2019-03-07 DIAGNOSIS — F1020 Alcohol dependence, uncomplicated: Secondary | ICD-10-CM | POA: Diagnosis present

## 2019-03-07 DIAGNOSIS — R52 Pain, unspecified: Secondary | ICD-10-CM | POA: Diagnosis not present

## 2019-03-07 DIAGNOSIS — E876 Hypokalemia: Secondary | ICD-10-CM | POA: Diagnosis not present

## 2019-03-07 LAB — CBC WITH DIFFERENTIAL/PLATELET
Abs Immature Granulocytes: 0.11 10*3/uL — ABNORMAL HIGH (ref 0.00–0.07)
Basophils Absolute: 0 10*3/uL (ref 0.0–0.1)
Basophils Relative: 0 %
Eosinophils Absolute: 0 10*3/uL (ref 0.0–0.5)
Eosinophils Relative: 0 %
HCT: 36.9 % — ABNORMAL LOW (ref 39.0–52.0)
Hemoglobin: 12.1 g/dL — ABNORMAL LOW (ref 13.0–17.0)
Immature Granulocytes: 1 %
Lymphocytes Relative: 2 %
Lymphs Abs: 0.4 10*3/uL — ABNORMAL LOW (ref 0.7–4.0)
MCH: 31.3 pg (ref 26.0–34.0)
MCHC: 32.8 g/dL (ref 30.0–36.0)
MCV: 95.6 fL (ref 80.0–100.0)
Monocytes Absolute: 0.8 10*3/uL (ref 0.1–1.0)
Monocytes Relative: 5 %
Neutro Abs: 17 10*3/uL — ABNORMAL HIGH (ref 1.7–7.7)
Neutrophils Relative %: 92 %
Platelets: 151 10*3/uL (ref 150–400)
RBC: 3.86 MIL/uL — ABNORMAL LOW (ref 4.22–5.81)
RDW: 13.2 % (ref 11.5–15.5)
WBC: 18.4 10*3/uL — ABNORMAL HIGH (ref 4.0–10.5)
nRBC: 0 % (ref 0.0–0.2)

## 2019-03-07 LAB — BLOOD GAS, ARTERIAL
Acid-base deficit: 7.1 mmol/L — ABNORMAL HIGH (ref 0.0–2.0)
Bicarbonate: 16.8 mmol/L — ABNORMAL LOW (ref 20.0–28.0)
FIO2: 0.21
O2 Saturation: 96.1 %
Patient temperature: 37
pCO2 arterial: 29 mmHg — ABNORMAL LOW (ref 32.0–48.0)
pH, Arterial: 7.37 (ref 7.350–7.450)
pO2, Arterial: 85 mmHg (ref 83.0–108.0)

## 2019-03-07 LAB — URINE DRUG SCREEN, QUALITATIVE (ARMC ONLY)
Amphetamines, Ur Screen: NOT DETECTED
Barbiturates, Ur Screen: NOT DETECTED
Benzodiazepine, Ur Scrn: NOT DETECTED
Cannabinoid 50 Ng, Ur ~~LOC~~: POSITIVE — AB
Cocaine Metabolite,Ur ~~LOC~~: NOT DETECTED
MDMA (Ecstasy)Ur Screen: NOT DETECTED
Methadone Scn, Ur: NOT DETECTED
Opiate, Ur Screen: NOT DETECTED
Phencyclidine (PCP) Ur S: NOT DETECTED
Tricyclic, Ur Screen: NOT DETECTED

## 2019-03-07 LAB — SEDIMENTATION RATE: Sed Rate: 27 mm/hr — ABNORMAL HIGH (ref 0–20)

## 2019-03-07 LAB — COMPREHENSIVE METABOLIC PANEL
ALT: 53 U/L — ABNORMAL HIGH (ref 0–44)
AST: 269 U/L — ABNORMAL HIGH (ref 15–41)
Albumin: 3.8 g/dL (ref 3.5–5.0)
Alkaline Phosphatase: 77 U/L (ref 38–126)
Anion gap: 22 — ABNORMAL HIGH (ref 5–15)
BUN: 17 mg/dL (ref 8–23)
CO2: 18 mmol/L — ABNORMAL LOW (ref 22–32)
Calcium: 8.6 mg/dL — ABNORMAL LOW (ref 8.9–10.3)
Chloride: 100 mmol/L (ref 98–111)
Creatinine, Ser: 1.59 mg/dL — ABNORMAL HIGH (ref 0.61–1.24)
GFR calc Af Amer: 52 mL/min — ABNORMAL LOW (ref >60)
GFR calc non Af Amer: 45 mL/min — ABNORMAL LOW (ref >60)
Glucose, Bld: 115 mg/dL — ABNORMAL HIGH (ref 70–99)
Potassium: 3 mmol/L — ABNORMAL LOW (ref 3.5–5.1)
Sodium: 140 mmol/L (ref 135–145)
Total Bilirubin: 1.2 mg/dL (ref 0.3–1.2)
Total Protein: 7 g/dL (ref 6.5–8.1)

## 2019-03-07 LAB — ETHANOL: Alcohol, Ethyl (B): 91 mg/dL — ABNORMAL HIGH (ref <10)

## 2019-03-07 LAB — LACTIC ACID, PLASMA
Lactic Acid, Venous: 5.8 mmol/L (ref 0.5–1.9)
Lactic Acid, Venous: 4.6 mmol/L (ref 0.5–1.9)

## 2019-03-07 LAB — BLOOD GAS, VENOUS
Acid-base deficit: 4.7 mmol/L — ABNORMAL HIGH (ref 0.0–2.0)
Bicarbonate: 20.4 mmol/L (ref 20.0–28.0)
O2 Saturation: 82 %
Patient temperature: 37
pCO2, Ven: 37 mmHg — ABNORMAL LOW (ref 44.0–60.0)
pH, Ven: 7.35 (ref 7.250–7.430)
pO2, Ven: 49 mmHg — ABNORMAL HIGH (ref 32.0–45.0)

## 2019-03-07 LAB — LIPASE, BLOOD: Lipase: 60 U/L — ABNORMAL HIGH (ref 11–51)

## 2019-03-07 LAB — AMMONIA: Ammonia: 15 umol/L (ref 9–35)

## 2019-03-07 LAB — TROPONIN I (HIGH SENSITIVITY)
Troponin I (High Sensitivity): 245 ng/L (ref <18)
Troponin I (High Sensitivity): 317 ng/L (ref <18)

## 2019-03-07 LAB — CK: Total CK: 2779 U/L — ABNORMAL HIGH (ref 49–397)

## 2019-03-07 MED ORDER — ONDANSETRON HCL 4 MG PO TABS: 4.0000 mg | ORAL_TABLET | Freq: Four times a day (QID) | ORAL | Status: DC | PRN

## 2019-03-07 MED ORDER — VANCOMYCIN HCL IN DEXTROSE 1-5 GM/200ML-% IV SOLN
1000.0000 mg | Freq: Once | INTRAVENOUS | Status: AC
  Administered 2019-03-07: 1000 mg via INTRAVENOUS
  Filled 2019-03-07: qty 200

## 2019-03-07 MED ORDER — THIAMINE HCL 100 MG/ML IJ SOLN
Freq: Once | INTRAVENOUS | Status: AC
  Filled 2019-03-07: qty 1000

## 2019-03-07 MED ORDER — SODIUM CHLORIDE 0.9% FLUSH
3.0000 mL | Freq: Two times a day (BID) | INTRAVENOUS | Status: DC
  Administered 2019-03-08 – 2019-03-14 (×8): 3 mL via INTRAVENOUS

## 2019-03-07 MED ORDER — MORPHINE SULFATE (PF) 2 MG/ML IV SOLN
2.0000 mg | INTRAVENOUS | Status: DC | PRN
  Administered 2019-03-08 – 2019-03-10 (×5): 2 mg via INTRAVENOUS
  Filled 2019-03-07 (×6): qty 1

## 2019-03-07 MED ORDER — SODIUM CHLORIDE 0.9 % IV BOLUS
1000.0000 mL | Freq: Once | INTRAVENOUS | Status: AC
  Administered 2019-03-07: 22:00:00 1000 mL via INTRAVENOUS

## 2019-03-07 MED ORDER — SODIUM CHLORIDE 0.9 % IV BOLUS
1000.0000 mL | Freq: Once | INTRAVENOUS | Status: AC
  Administered 2019-03-07: 1000 mL via INTRAVENOUS

## 2019-03-07 MED ORDER — SENNOSIDES-DOCUSATE SODIUM 8.6-50 MG PO TABS: 1.0000 | ORAL_TABLET | Freq: Every evening | ORAL | Status: DC | PRN

## 2019-03-07 MED ORDER — ACETAMINOPHEN 650 MG RE SUPP: 650.0000 mg | Freq: Four times a day (QID) | RECTAL | Status: DC | PRN

## 2019-03-07 MED ORDER — SODIUM CHLORIDE 0.9 % IV SOLN: INTRAVENOUS | Status: DC

## 2019-03-07 MED ORDER — ACETAMINOPHEN 325 MG PO TABS
650.0000 mg | ORAL_TABLET | Freq: Four times a day (QID) | ORAL | Status: DC | PRN
  Administered 2019-03-08 – 2019-03-13 (×2): 650 mg via ORAL
  Filled 2019-03-07 (×3): qty 2

## 2019-03-07 MED ORDER — ATORVASTATIN CALCIUM 20 MG PO TABS: 40.0000 mg | ORAL_TABLET | Freq: Every day | ORAL | Status: DC

## 2019-03-07 MED ORDER — ASPIRIN EC 81 MG PO TBEC
81.0000 mg | DELAYED_RELEASE_TABLET | Freq: Every day | ORAL | Status: DC
  Administered 2019-03-08 – 2019-03-14 (×7): 81 mg via ORAL
  Filled 2019-03-07 (×8): qty 1

## 2019-03-07 MED ORDER — ONDANSETRON HCL 4 MG/2ML IJ SOLN: 4.0000 mg | Freq: Four times a day (QID) | INTRAMUSCULAR | Status: DC | PRN

## 2019-03-07 MED ORDER — SODIUM CHLORIDE 0.9 % IV SOLN: 100.0000 mg/kg | Freq: Three times a day (TID) | INTRAVENOUS | Status: DC

## 2019-03-07 MED ORDER — PIPERACILLIN-TAZOBACTAM 3.375 G IVPB
3.3750 g | Freq: Three times a day (TID) | INTRAVENOUS | Status: DC
  Administered 2019-03-08 – 2019-03-09 (×5): 3.375 g via INTRAVENOUS
  Filled 2019-03-07 (×5): qty 50

## 2019-03-07 MED ORDER — ENOXAPARIN SODIUM 80 MG/0.8ML ~~LOC~~ SOLN
1.0000 mg/kg | Freq: Two times a day (BID) | SUBCUTANEOUS | Status: DC
  Administered 2019-03-08 – 2019-03-09 (×4): 80 mg via SUBCUTANEOUS
  Filled 2019-03-07 (×4): qty 0.8

## 2019-03-07 MED ORDER — ATORVASTATIN CALCIUM 20 MG PO TABS
40.0000 mg | ORAL_TABLET | Freq: Every day | ORAL | Status: DC
  Administered 2019-03-08: 40 mg via ORAL
  Filled 2019-03-07: qty 2

## 2019-03-07 NOTE — H&P (Addendum)
History and Physical    John Spears NGE:[id number] DOB: 1950-12-16 DOA: 03/07/2019  PCP: Derwood Kaplan, MD  Patient coming from: Home  I have personally briefly reviewed patient's old medical records in [facility]  Chief Complaint: Bilateral lower extremity pain  HPI: John Spears is a 69 y.o. male with medical history significant of  Alcohol abuse heart disease hyperlipidemia hypertension rheumatoid arthritis prior history of stroke who presents to the ED with complaint of bilateral lower extremity pain  right greater than left lower extremity.  Patient states that around 4 days ago he had a mechanical fall where he fell and injured his hip and knee.  States at that time he was able to stand up on his own and ambulate.  Patient states over the next day he had significant amount of pain in his lower extremities his right greater than left.  He states that his pain is centered mostly around in his hips as well as his knees.  However he also noted diffuse  Muscle pain of his lower extremity.  He notes no associated fever or chills with this new lower extremity pain he notes no difficulty with bowel or bladder incontinence he also notes no associated numbness.  He also denies any associated back pain.  Patient presents to the ED due to worsening symptoms and significant pain.  ED Course:  Vitals: Afebrile, BP 167/77,HR 81, RR 20, SAT 98% Labs: WBC 18.4, Hgb 12.1, K 3.0, CR 1.59 at baseline, lactic 5.8 CK 2779, high-sensitivity prone troponin  245,, sed rate 27 alcohol 91 Review of Systems: As per HPI otherwise 10 point review of systems negative.   Past Medical History:  Diagnosis Date  . Hypertension     Past Surgical History:  Procedure Laterality Date  . JOINT REPLACEMENT       reports that he has quit smoking. He has never used smokeless tobacco. He reports current alcohol use of about 28.0 standard drinks of alcohol per week. He reports that he does not use drugs.  No  Known Allergies  History reviewed. No pertinent family history.  Prior to Admission medications   Medication Sig Start Date End Date Taking? Authorizing Provider  Aspirin-Caffeine (BC FAST PAIN RELIEF ARTHRITIS) 1000-65 MG PACK Take 1 Package by mouth every 6 (six) hours as needed (pain).   Yes [provider]    Physical Exam: Vitals:   03/07/19 1853 03/07/19 1856  BP:  (!) 167/77  Pulse:  81  Resp:  20  Temp:  98.5 F (36.9 C)  TempSrc:  Oral  SpO2:  98%  Weight: 81.6 kg     Constitutional: NAD, calm, comfortable Vitals:   03/07/19 1853 03/07/19 1856  BP:  (!) 167/77  Pulse:  81  Resp:  20  Temp:  98.5 F (36.9 C)  TempSrc:  Oral  SpO2:  98%  Weight: 81.6 kg    Eyes: PERRL, lids and conjunctivae normal ENMT: Mucous membranes are moist. Posterior pharynx clear of any exudate or lesions.Normal dentition.  Neck: normal, supple, no masses, no thyromegaly Respiratory: clear to auscultation bilaterally, no wheezing, no crackles. Normal respiratory effort. No accessory muscle use.  Cardiovascular: Regular rate and rhythm, no murmurs / rubs / gallops. No extremity edema. 2+ pedal pulses. No carotid bruits.  Abdomen: no tenderness, no masses palpated. No hepatosplenomegaly. Bowel sounds positive.  Musculoskeletal: no clubbing / cyanosis. No joint deformity upper and lower extremities.  Decrease ROM due to pain, no contractures. Normal muscle tone.  Tender muscles of lower extremity on palpation, right knee tender to palpation without effusion.  Unable to examining spine due to patient willingness to comply with exam Skin: no rashes, lesions, ulcers. No induration Neurologic: CN 2-12 grossly intact. Sensation intact, patient is moving all extremities he withdraws from pain but will not participate fully in examination due to pain Psychiatric: Normal judgment and insight. Alert and oriented x 3. Normal mood.    Labs on Admission: I have personally reviewed following labs  and imaging studies  CBC: Recent Labs  Lab 03/07/19 1856  WBC 18.4*  NEUTROABS 17.0*  HGB 12.1*  HCT 36.9*  MCV 95.6  PLT 151   Basic Metabolic Panel: Recent Labs  Lab 03/07/19 1856  NA 140  K 3.0*  CL 100  CO2 18*  GLUCOSE 115*  BUN 17  CREATININE 1.59*  CALCIUM 8.6*   GFR: CrCl cannot be calculated (Unknown ideal weight.). Liver Function Tests: Recent Labs  Lab 03/07/19 1856  AST 269*  ALT 53*  ALKPHOS 77  BILITOT 1.2  PROT 7.0  ALBUMIN 3.8   Recent Labs  Lab 03/07/19 2007  LIPASE 60*   Recent Labs  Lab 03/07/19 2007  AMMONIA 15   Coagulation Profile: No results for input(s): INR, PROTIME in the last 168 hours. Cardiac Enzymes: Recent Labs  Lab 03/07/19 1856  CKTOTAL 2,779*   BNP (last 3 results) No results for input(s): PROBNP in the last 8760 hours. HbA1C: No results for input(s): HGBA1C in the last 72 hours. CBG: No results for input(s): GLUCAP in the last 168 hours. Lipid Profile: No results for input(s): CHOL, HDL, LDLCALC, TRIG, CHOLHDL, LDLDIRECT in the last 72 hours. Thyroid Function Tests: No results for input(s): TSH, T4TOTAL, FREET4, T3FREE, THYROIDAB in the last 72 hours. Anemia Panel: No results for input(s): VITAMINB12, FOLATE, FERRITIN, TIBC, IRON, RETICCTPCT in the last 72 hours. Urine analysis: No results found for: COLORURINE, APPEARANCEUR, LABSPEC, PHURINE, GLUCOSEU, HGBUR, BILIRUBINUR, KETONESUR, PROTEINUR, UROBILINOGEN, NITRITE, LEUKOCYTESUR  Radiological Exams on Admission: DG Chest 1 View  Result Date: 03/07/2019 CLINICAL DATA:  Altered mental status. EXAM: CHEST  1 VIEW COMPARISON:  October 22, 2016. FINDINGS: The heart size and mediastinal contours are within normal limits. Both lungs are clear. The visualized skeletal structures are unremarkable. IMPRESSION: No active disease. Electronically Signed   By: Lupita Raider M.D.   On: 03/07/2019 21:33   DG Knee 1-2 Views Right  Result Date: 03/07/2019 CLINICAL DATA:   Right knee pain after fall. EXAM: RIGHT KNEE - 1-2 VIEW COMPARISON:  None. FINDINGS: No evidence of fracture or dislocation. Mild narrowing of medial joint space is noted. Soft tissues are unremarkable. IMPRESSION: Mild degenerative joint disease is noted medially. No definite fracture or dislocation is seen. Electronically Signed   By: Lupita Raider M.D.   On: 03/07/2019 19:58   CT Head Wo Contrast  Result Date: 03/07/2019 CLINICAL DATA:  Larey Seat from a standing position. Trauma to the head and neck. EXAM: CT HEAD WITHOUT CONTRAST CT CERVICAL SPINE WITHOUT CONTRAST TECHNIQUE: Multidetector CT imaging of the head and cervical spine was performed following the standard protocol without intravenous contrast. Multiplanar CT image reconstructions of the cervical spine were also generated. COMPARISON:  February 2018. FINDINGS: CT HEAD FINDINGS Brain: Age advanced generalized atrophy. Chronic small-vessel ischemic changes of the hemispheric white matter. No sign of acute infarction, mass lesion, hemorrhage, hydrocephalus or extra-axial collection. Vascular: There is atherosclerotic calcification of the major vessels at the base of the  brain. Skull: No skull fracture. Sinuses/Orbits: Clear/normal Other: Right forehead scalp lipoma. CT CERVICAL SPINE FINDINGS Alignment: Normal Skull base and vertebrae: No traumatic finding or focal bone finding. Soft tissues and spinal canal: Negative Disc levels: Degenerative spondylosis at C5-6 with disc space narrowing. No significant osteophytic stenosis of the canal or foramina. No advanced facet arthritis. Upper chest: Negative Other: None IMPRESSION: Head CT: No acute or traumatic finding. Atrophy and chronic small-vessel ischemic changes. Cervical spine CT: No acute or traumatic finding. Mild midcervical spondylosis. Electronically Signed   By: Nelson Chimes M.D.   On: 03/07/2019 20:19   CT Cervical Spine Wo Contrast  Result Date: 03/07/2019 CLINICAL DATA:  [place] from a  standing position. Trauma to the head and neck. EXAM: CT HEAD WITHOUT CONTRAST CT CERVICAL SPINE WITHOUT CONTRAST TECHNIQUE: Multidetector CT imaging of the head and cervical spine was performed following the standard protocol without intravenous contrast. Multiplanar CT image reconstructions of the cervical spine were also generated. COMPARISON:  February 2018. FINDINGS: CT HEAD FINDINGS Brain: Age advanced generalized atrophy. Chronic small-vessel ischemic changes of the hemispheric white matter. No sign of acute infarction, mass lesion, hemorrhage, hydrocephalus or extra-axial collection. Vascular: There is atherosclerotic calcification of the major vessels at the base of the brain. Skull: No skull fracture. Sinuses/Orbits: Clear/normal Other: Right forehead scalp lipoma. CT CERVICAL SPINE FINDINGS Alignment: Normal Skull base and vertebrae: No traumatic finding or focal bone finding. Soft tissues and spinal canal: Negative Disc levels: Degenerative spondylosis at C5-6 with disc space narrowing. No significant osteophytic stenosis of the canal or foramina. No advanced facet arthritis. Upper chest: Negative Other: None IMPRESSION: Head CT: No acute or traumatic finding. Atrophy and chronic small-vessel ischemic changes. Cervical spine CT: No acute or traumatic finding. Mild midcervical spondylosis. Electronically Signed   By: Nelson Chimes M.D.   On: 03/07/2019 20:19   DG Lumbar Spine 1 View  Result Date: 03/07/2019 CLINICAL DATA:  Low back pain after fall last week. EXAM: LUMBAR SPINE - 1 VIEW COMPARISON:  August 11, 2004. FINDINGS: Only AP projection was obtained. Spondylolisthesis therefore cannot be excluded on this 1 projection. No definite fracture is noted, but this also cannot be completely excluded without lateral projection. IMPRESSION: Limited examination as only an AP projection is obtained. Spondylolisthesis or fracture cannot be excluded on the basis of this study. Lateral projection is recommended  for further evaluation. Electronically Signed   By: Marijo Conception M.D.   On: 03/07/2019 19:58   DG Hip Unilat W or Wo Pelvis 2-3 Views Left  Result Date: 03/07/2019 CLINICAL DATA:  Left hip pain after fall last week. EXAM: DG HIP (WITH OR WITHOUT PELVIS) 2-3V LEFT COMPARISON:  None. FINDINGS: There is no evidence of hip fracture or dislocation. There is no evidence of arthropathy or other focal bone abnormality. IMPRESSION: Negative. Electronically Signed   By: Marijo Conception M.D.   On: 03/07/2019 20:01   DG Hip Unilat W or Wo Pelvis 2-3 Views Right  Result Date: 03/07/2019 CLINICAL DATA:  Right femur pain after fall last week. EXAM: DG HIP (WITH OR WITHOUT PELVIS) 2-3V RIGHT COMPARISON:  None. FINDINGS: Status post right hip arthroplasty. No fracture or dislocation is noted. Soft tissues are unremarkable. IMPRESSION: No acute abnormality is noted. Electronically Signed   By: Marijo Conception M.D.   On: 03/07/2019 20:00    EKG: Independently reviewed.  Sinus rhythm anterior lateral nonspecific T wave changes  Assessment/Plan  Traumatic rhabdomyolysis Lactic acidosis with associated  metabolic acidosis Leukocytosis NOS Chronic alcoholism Hypokalemia Chronic renal insufficiency Transaminitis due to EtOH use Mild elevation in lipase Anemia NOS Abnormal troponin Abnormal EKG  Plan  Traumatic rhabdomyolysis causing bilateral lower extremity pain -Aggressive IV fluids -Monitor CK lab -Supportive care with pain medication  Leukocytosis with increased neutrophil predominance -Concern for infection NOS -No current fever noted -Broad-spectrum antibiotics -Continue to pursue infectious work-up -As far infectious work-up is negative await culture data -Clear antibiotics as able -Calcitonin pending  Lactic acidosis with associated metabolic acidosis -In part due to chronic alcohol use -Part due to volume depletion -IVF -Trend lactic acid as per protocol  History of CAD now with  abnormal cardiac enzymes//abnormal EKG -Cardiac enzymes continue to trend up -Patient without any clinical signs of cardiac ischemia -Most likely elevation due to rhabdo however will need to rule out cardiac cause due to continued elevation -Echocardiogram in the a.m., cardiology consult for assistance -Resume outpatient cardiac regimen once med rec has been completed  History of rheumatoid arthritis Not currently on any DMARD  History of CVA Continue secondary prophylaxis  Chronic alcoholism -Placed on CIWA protocol -MVI  Bag  Hypokalemia -Replete as needed  Chronic renal insufficiency -Creatinine at baseline  Transaminitis due to EtOH use -Continue to trend labs  Mild elevation in lipase -Due to EtOH abuse -Currently no imaging or clinical signs of acute pancreatitis  Anemia NOS -Anemia labs pending -Most likely related to EtOH use and marrow suppression//nutritional deficiency  Debility PT OT to see   DVT prophylaxis: Lovenox  code Status: Full Family Communication: N/A disposition Plan: Patient to be admitted more than 2 midnights consults called: Cardiology  admission status: Patient   Lurline Del MD [facility] Pager [phone] If 7PM-7AM, please contact night-coverage [email or web address] Password [facility]  03/07/2019, 11:04 PM

## 2019-03-07 NOTE — ED Notes (Signed)
Date and time results received: 03/07/19    Test: Lactic Critical Value: 5.8  Troponin 245  Name of Provider Notified: Cuthriell

## 2019-03-07 NOTE — ED Provider Notes (Signed)
[facility] Emergency Department Provider Note  ____________________________________________  Time seen: Approximately 7:12 PM  I have reviewed the triage vital signs and the nursing notes.   HISTORY  Chief Complaint Weakness  Patient is a poor historian.  HPI John Spears is a 69 y.o. male who presents the emergency department via EMS for complaint of back, bilateral hip, right upper leg pain after a fall a week ago.  Patient states that he was standing, fell backwards.  He did not hit his head or lose consciousness at the time.  He states that he does not remember exactly why he fell, but he does not believe he tripped.  The patient at this time states that he is hurting in his back, right hip greater than left but is endorsing bilateral hip pain, right upper leg pain extending to the knee.  No bowel or bladder incontinence.  Patient reports that he is unable to move the right lower extremity due to pain.  He is not sure if he hit his head, but he does not endorse any headache, visual changes, neck pain, chest pain, shortness of breath, domino pain, nausea vomiting at this time.  Patient has a history of CVA and hypertension.  No complaints with either medical conditions.         Past Medical History:  Diagnosis Date  . Hypertension     Patient Active Problem List   Diagnosis Date Noted  . Lower extremity weakness 03/07/2019  . Acute CVA (cerebrovascular accident) ([place]) 04/05/2016    Past Surgical History:  Procedure Laterality Date  . JOINT REPLACEMENT      Prior to Admission medications   Medication Sig Start Date End Date Taking? Authorizing Provider  Aspirin-Caffeine (BC FAST PAIN RELIEF ARTHRITIS) 1000-65 MG PACK Take 1 Package by mouth every 6 (six) hours as needed (pain).   Yes [provider]    Allergies Patient has no known allergies.  History reviewed. No pertinent family history.  Social History Social History    Tobacco Use  . Smoking status: Former Research scientist (life sciences)  . Smokeless tobacco: Never Used  Substance Use Topics  . Alcohol use: Yes    Alcohol/week: 28.0 standard drinks    Types: 28 Cans of beer per week    Comment: drinks 1/5 per week or more  . Drug use: No     Review of Systems  Constitutional: No fever/chills Eyes: No visual changes. No discharge ENT: No upper respiratory complaints. Cardiovascular: no chest pain. Respiratory: no cough. No SOB. Gastrointestinal: No abdominal pain.  No nausea, no vomiting.  Genitourinary: Negative for dysuria. No hematuria Musculoskeletal: Endorses back, bilateral hip, right femur and right knee pain. Skin: Negative for rash, abrasions, lacerations, ecchymosis. Neurological: Negative for headaches, focal weakness or numbness. 10-point ROS otherwise negative.  ____________________________________________   PHYSICAL EXAM:  VITAL SIGNS: ED Triage Vitals  Enc Vitals Group     BP 03/07/19 1856 (!) 167/77     Pulse Rate 03/07/19 1856 81     Resp 03/07/19 1856 20     Temp 03/07/19 1856 98.5 F (36.9 C)     Temp Source 03/07/19 1856 Oral     SpO2 03/07/19 1856 98 %     Weight 03/07/19 1853 180 lb (81.6 kg)     Height --      Head Circumference --      Peak Flow --      Pain Score 03/07/19 1853 10     Pain Loc --  Pain Edu? --      Excl. in [place]? --      Constitutional: Alert and oriented. Well appearing and in no acute distress. Eyes: Conjunctivae are normal. PERRL. EOMI. Head: Atraumatic. ENT:      Ears:       Nose: No congestion/rhinnorhea.      Mouth/Throat: Mucous membranes are moist.  Neck: No stridor.  No cervical spine tenderness to palpation.  Cardiovascular: Normal rate, regular rhythm. Normal S1 and S2.  Good peripheral circulation. Respiratory: Normal respiratory effort without tachypnea or retractions. Lungs CTAB. Good air entry to the bases with no decreased or absent breath sounds. Gastrointestinal: Bowel sounds 4  quadrants. Soft and nontender to palpation. No guarding or rigidity. No palpable masses. No distention.  Musculoskeletal: Limited range of motion to the right lower extremity due to pain, otherwise good range of motion to the bilateral upper, left lower extremities.  No gross deformities appreciated.  Visualization of the lumbar spine reveals no evidence of trauma with edema, abrasions, lacerations, significant ecchymosis.  Diffuse mild tenderness throughout the lumbar spine both midline and bilateral paraspinal muscle.  No tenderness extending into the left SI joint, left-sided sciatic notch.  Patient is slightly tender to palpation over the left lateral trochanter region.  No palpable abnormality.  No shortening or rotation of the left lower extremity.  No other tenderness to palpation along the left lower extremity.  Palpation along the SI joint extending into the lateral hip is very tender on the right side.  This extends into the inguinal region.  No palpable abnormalities.  Patient has limited range of motion to the hip, knee, ankle and right lower extremity due to pain.  Patient reports diffuse tenderness to palpation throughout the femur to the level of the knee.  No significant tenderness along the tibia or fibular region.  Dorsalis pedis pulse intact bilateral lower extremities.  Sensation intact and equal bilateral lower extremities. Neurologic:  Normal speech and language. No gross focal neurologic deficits are appreciated.  Cranial nerves II through XII grossly intact. Skin:  Skin is warm, dry and intact. No rash noted. Psychiatric: Mood and affect are normal. Speech and behavior are normal. Patient exhibits appropriate somewhat limited insight and judgement.   ____________________________________________   LABS (all labs ordered are listed, but only abnormal results are displayed)  Labs Reviewed  CBC WITH DIFFERENTIAL/PLATELET - Abnormal; Notable for the following components:      Result  Value   WBC 18.4 (*)    RBC 3.86 (*)    Hemoglobin 12.1 (*)    HCT 36.9 (*)    Neutro Abs 17.0 (*)    Lymphs Abs 0.4 (*)    Abs Immature Granulocytes 0.11 (*)    All other components within normal limits  COMPREHENSIVE METABOLIC PANEL - Abnormal; Notable for the following components:   Potassium 3.0 (*)    CO2 18 (*)    Glucose, Bld 115 (*)    Creatinine, Ser 1.59 (*)    Calcium 8.6 (*)    AST 269 (*)    ALT 53 (*)    GFR calc non Af Amer 45 (*)    GFR calc Af Amer 52 (*)    Anion gap 22 (*)    All other components within normal limits  BLOOD GAS, VENOUS - Abnormal; Notable for the following components:   pCO2, Ven 37 (*)    pO2, Ven 49.0 (*)    Acid-base deficit 4.7 (*)    All  other components within normal limits  LIPASE, BLOOD - Abnormal; Notable for the following components:   Lipase 60 (*)    All other components within normal limits  ETHANOL - Abnormal; Notable for the following components:   Alcohol, Ethyl (B) 91 (*)    All other components within normal limits  URINE DRUG SCREEN, QUALITATIVE ([facility] ONLY) - Abnormal; Notable for the following components:   Cannabinoid 50 Ng, Ur [place] POSITIVE (*)    All other components within normal limits  LACTIC ACID, PLASMA - Abnormal; Notable for the following components:   Lactic Acid, Venous 5.8 (*)    All other components within normal limits  LACTIC ACID, PLASMA - Abnormal; Notable for the following components:   Lactic Acid, Venous 4.6 (*)    All other components within normal limits  CK - Abnormal; Notable for the following components:   Total CK 2,779 (*)    All other components within normal limits  SEDIMENTATION RATE - Abnormal; Notable for the following components:   Sed Rate 27 (*)    All other components within normal limits  BLOOD GAS, ARTERIAL - Abnormal; Notable for the following components:   pCO2 arterial 29 (*)    Bicarbonate 16.8 (*)    Acid-base deficit 7.1 (*)    All other components within normal limits   TROPONIN I (HIGH SENSITIVITY) - Abnormal; Notable for the following components:   Troponin I (High Sensitivity) 245 (*)    All other components within normal limits  TROPONIN I (HIGH SENSITIVITY) - Abnormal; Notable for the following components:   Troponin I (High Sensitivity) 317 (*)    All other components within normal limits  SARS CORONAVIRUS 2 (TAT 6-24 HRS)  CULTURE, BLOOD (ROUTINE X 2)  CULTURE, BLOOD (ROUTINE X 2)  URINE CULTURE  AMMONIA  URINALYSIS, COMPLETE (UACMP) WITH MICROSCOPIC  C-REACTIVE PROTEIN  HIV ANTIBODY (ROUTINE TESTING W REFLEX)  TSH  BRAIN NATRIURETIC PEPTIDE  HEMOGLOBIN A1C  CBC WITH DIFFERENTIAL/PLATELET  PROTIME-INR  LIPID PANEL  VITAMIN B12  FOLATE  IRON AND TIBC  FERRITIN  RETICULOCYTES  PROCALCITONIN  PROCALCITONIN  APTT   ____________________________________________  EKG  ED ECG REPORT I, Charline Bills Dagoberto Nealy,  personally viewed and interpreted this ECG.   Date: 03/07/2019  EKG Time: 1852 hrs.  Rate: 88 bpm  Rhythm: unchanged from previous tracings, normal sinus rhythm  Axis: Normal axis  Intervals:none  ST&T Change: No ST elevation or depression noted  Normal sinus rhythm.  No STEMI.  ____________________________________________  RADIOLOGY I personally viewed and evaluated these images as part of my medical decision making, as well as reviewing the written report by the radiologist.  DG Chest 1 View  Result Date: 03/07/2019 CLINICAL DATA:  Altered mental status. EXAM: CHEST  1 VIEW COMPARISON:  October 22, 2016. FINDINGS: The heart size and mediastinal contours are within normal limits. Both lungs are clear. The visualized skeletal structures are unremarkable. IMPRESSION: No active disease. Electronically Signed   By: Marijo Conception M.D.   On: 03/07/2019 21:33   DG Knee 1-2 Views Right  Result Date: 03/07/2019 CLINICAL DATA:  Right knee pain after fall. EXAM: RIGHT KNEE - 1-2 VIEW COMPARISON:  None. FINDINGS: No evidence of  fracture or dislocation. Mild narrowing of medial joint space is noted. Soft tissues are unremarkable. IMPRESSION: Mild degenerative joint disease is noted medially. No definite fracture or dislocation is seen. Electronically Signed   By: Marijo Conception M.D.   On: 03/07/2019 19:58  CT Head Wo Contrast  Result Date: 03/07/2019 CLINICAL DATA:  [place] from a standing position. Trauma to the head and neck. EXAM: CT HEAD WITHOUT CONTRAST CT CERVICAL SPINE WITHOUT CONTRAST TECHNIQUE: Multidetector CT imaging of the head and cervical spine was performed following the standard protocol without intravenous contrast. Multiplanar CT image reconstructions of the cervical spine were also generated. COMPARISON:  February 2018. FINDINGS: CT HEAD FINDINGS Brain: Age advanced generalized atrophy. Chronic small-vessel ischemic changes of the hemispheric white matter. No sign of acute infarction, mass lesion, hemorrhage, hydrocephalus or extra-axial collection. Vascular: There is atherosclerotic calcification of the major vessels at the base of the brain. Skull: No skull fracture. Sinuses/Orbits: Clear/normal Other: Right forehead scalp lipoma. CT CERVICAL SPINE FINDINGS Alignment: Normal Skull base and vertebrae: No traumatic finding or focal bone finding. Soft tissues and spinal canal: Negative Disc levels: Degenerative spondylosis at C5-6 with disc space narrowing. No significant osteophytic stenosis of the canal or foramina. No advanced facet arthritis. Upper chest: Negative Other: None IMPRESSION: Head CT: No acute or traumatic finding. Atrophy and chronic small-vessel ischemic changes. Cervical spine CT: No acute or traumatic finding. Mild midcervical spondylosis. Electronically Signed   By: Nelson Chimes M.D.   On: 03/07/2019 20:19   CT Cervical Spine Wo Contrast  Result Date: 03/07/2019 CLINICAL DATA:  [place] from a standing position. Trauma to the head and neck. EXAM: CT HEAD WITHOUT CONTRAST CT CERVICAL SPINE WITHOUT  CONTRAST TECHNIQUE: Multidetector CT imaging of the head and cervical spine was performed following the standard protocol without intravenous contrast. Multiplanar CT image reconstructions of the cervical spine were also generated. COMPARISON:  February 2018. FINDINGS: CT HEAD FINDINGS Brain: Age advanced generalized atrophy. Chronic small-vessel ischemic changes of the hemispheric white matter. No sign of acute infarction, mass lesion, hemorrhage, hydrocephalus or extra-axial collection. Vascular: There is atherosclerotic calcification of the major vessels at the base of the brain. Skull: No skull fracture. Sinuses/Orbits: Clear/normal Other: Right forehead scalp lipoma. CT CERVICAL SPINE FINDINGS Alignment: Normal Skull base and vertebrae: No traumatic finding or focal bone finding. Soft tissues and spinal canal: Negative Disc levels: Degenerative spondylosis at C5-6 with disc space narrowing. No significant osteophytic stenosis of the canal or foramina. No advanced facet arthritis. Upper chest: Negative Other: None IMPRESSION: Head CT: No acute or traumatic finding. Atrophy and chronic small-vessel ischemic changes. Cervical spine CT: No acute or traumatic finding. Mild midcervical spondylosis. Electronically Signed   By: Nelson Chimes M.D.   On: 03/07/2019 20:19   DG Lumbar Spine 1 View  Result Date: 03/07/2019 CLINICAL DATA:  Low back pain after fall last week. EXAM: LUMBAR SPINE - 1 VIEW COMPARISON:  August 11, 2004. FINDINGS: Only AP projection was obtained. Spondylolisthesis therefore cannot be excluded on this 1 projection. No definite fracture is noted, but this also cannot be completely excluded without lateral projection. IMPRESSION: Limited examination as only an AP projection is obtained. Spondylolisthesis or fracture cannot be excluded on the basis of this study. Lateral projection is recommended for further evaluation. Electronically Signed   By: Marijo Conception M.D.   On: 03/07/2019 19:58   DG  Hip Unilat W or Wo Pelvis 2-3 Views Left  Result Date: 03/07/2019 CLINICAL DATA:  Left hip pain after fall last week. EXAM: DG HIP (WITH OR WITHOUT PELVIS) 2-3V LEFT COMPARISON:  None. FINDINGS: There is no evidence of hip fracture or dislocation. There is no evidence of arthropathy or other focal bone abnormality. IMPRESSION: Negative. Electronically Signed  By: Marijo Conception M.D.   On: 03/07/2019 20:01   DG Hip Unilat W or Wo Pelvis 2-3 Views Right  Result Date: 03/07/2019 CLINICAL DATA:  Right femur pain after fall last week. EXAM: DG HIP (WITH OR WITHOUT PELVIS) 2-3V RIGHT COMPARISON:  None. FINDINGS: Status post right hip arthroplasty. No fracture or dislocation is noted. Soft tissues are unremarkable. IMPRESSION: No acute abnormality is noted. Electronically Signed   By: Marijo Conception M.D.   On: 03/07/2019 20:00    ____________________________________________    PROCEDURES  Procedure(s) performed:    Procedures    Medications  vancomycin (VANCOCIN) IVPB 1000 mg/200 mL premix (1,000 mg Intravenous New Bag/Given 03/07/19 2330)  sodium chloride flush (NS) 0.9 % injection 3 mL (has no administration in time range)  sodium chloride 0.9 % 1,000 mL with thiamine 976 mg, folic acid 1 mg, multivitamins adult 10 mL infusion (has no administration in time range)  0.9 %  sodium chloride infusion (has no administration in time range)  acetaminophen (TYLENOL) tablet 650 mg (has no administration in time range)    Or  acetaminophen (TYLENOL) suppository 650 mg (has no administration in time range)  senna-docusate (Senokot-S) tablet 1 tablet (has no administration in time range)  ondansetron (ZOFRAN) tablet 4 mg (has no administration in time range)    Or  ondansetron (ZOFRAN) injection 4 mg (has no administration in time range)  enoxaparin (LOVENOX) injection 80 mg (has no administration in time range)  aspirin EC tablet 81 mg (has no administration in time range)  morphine 2 MG/ML  injection 2 mg (has no administration in time range)  atorvastatin (LIPITOR) tablet 40 mg (has no administration in time range)  piperacillin-tazobactam (ZOSYN) IVPB 3.375 g (has no administration in time range)  sodium chloride 0.9 % bolus 1,000 mL (0 mLs Intravenous Stopped 03/07/19 2344)  sodium chloride 0.9 % bolus 1,000 mL (1,000 mLs Intravenous New Bag/Given 03/07/19 2218)     ____________________________________________   INITIAL IMPRESSION / ASSESSMENT AND PLAN / ED COURSE  Pertinent labs & imaging results that were available during my care of the patient were reviewed by me and considered in my medical decision making (see chart for details).  Review of the [place] CSRS was performed in accordance of the [place] prior to dispensing any controlled drugs.           Patient's diagnosis is consistent with traumatic rhabdomyolysis, secondary to a fall with contusion of the right hip, weakness, altered mental status and alcoholic ketoacidosis. Patient presented to the emergency department after a fall 1 week ago. Patient states that the pain in his hip, back have kept him sitting in a chair without moving since the injury. Patient does have a history of replacement to the right hip and was concerned that he may have fractured same. Patient was confused, was a poor historian. Patient was slightly combative with x-ray limiting his x-ray films. Given his state of confusion, labs, CT head and neck were also obtained. Imaging reveals no traumatic findings. Patient has elevated white blood cell count, elevated LFTs, elevated anion gap, elevated CK. It appears that patient is in traumatic rhabdomyolysis. Patient also has elevated anion gap, altered mental status, elevated LFTs with a history of heavy alcohol consumption in the past. At this point, patient would best be suited with inpatient admission. I will place a consult to the hospitalist service to admit the patient.   While discussing the case with  the hospitalist, further labs  returned.  Patient had an elevated lipase, elevated alcohol level, elevated troponin, reassuring ammonia level.  At this time with findings, concern still exists for intra-abdominal infection, obstructive cholelithiasis, urinary tract infection, pneumonia, Covid.  As patient is a poor historian it is difficult to determine whether patient had any symptoms prior to his fall.  It is unable to be determined whether patient had a condition causing his fall, that is now worsened, or whether fall has led to further deterioration leading to traumatic rhabdo, acidosis, heart strain.  Regardless, patient will be started on fluids, antibiotics for elevated white blood cell count, lactic acid and elevated CK.  Findings with lipase, alcohol, elevated LFTs and anion gap could be alcoholic ketoacidosis as well as a possible source of sepsis.  This does not appear to be diabetic ketoacidosis as patient has no history of diabetes, blood sugars reassuring.  Acidosis may also be secondary to an as of yet undetermined source of infection.  Likely bacteremia as patient has endorsed no other symptoms, however urinalysis, chest x-ray had not returned at this time,.   At the request of hospital service, ABG was ordered as well as ESR and CRP.  Patient will be admitted to the hospital service for further management.  Patient has been started on fluids, antibiotics here in the emergency department.  Covid test had already been performed prior to admission.    ____________________________________________  FINAL CLINICAL IMPRESSION(S) / ED DIAGNOSES  Final diagnoses:  Altered mental status, unspecified altered mental status type  Weakness  Fall, initial encounter  Contusion of right hip, initial encounter  Alcoholic ketoacidosis  Traumatic rhabdomyolysis, initial encounter ([place])  Elevated troponin      NEW MEDICATIONS STARTED DURING THIS VISIT:  ED Discharge Orders    None           This chart was dictated using voice recognition software/Dragon. Despite best efforts to proofread, errors can occur which can change the meaning. Any change was purely unintentional.    Darletta Moll, PA-C 03/07/19 2359    Harvest Dark, MD 03/10/19 1415

## 2019-03-07 NOTE — ED Notes (Signed)
Darrin Luis (sister) would like to be updated on patient's status. Phone number updated in Epic.

## 2019-03-07 NOTE — ED Triage Notes (Signed)
Here after fall last Thursday. Pt was standing and fell back.  Pain seems to be mostly in right hip. Unable to raise right leg of bed although c/o both legs hurting and being weak. Pt does not remember why he fell. Denies incontinence.  No numbness or tingling.

## 2019-03-07 NOTE — ED Notes (Signed)
Pt to CT

## 2019-03-08 ENCOUNTER — Inpatient Hospital Stay: Payer: Medicare HMO

## 2019-03-08 ENCOUNTER — Inpatient Hospital Stay (HOSPITAL_COMMUNITY)
Admit: 2019-03-08 | Discharge: 2019-03-08 | Disposition: A | Discharge status: Home / Self Care | Payer: Medicare HMO | Attending: Internal Medicine | Admitting: Internal Medicine

## 2019-03-08 DIAGNOSIS — D696 Thrombocytopenia, unspecified: Secondary | ICD-10-CM

## 2019-03-08 DIAGNOSIS — R079 Chest pain, unspecified: Secondary | ICD-10-CM

## 2019-03-08 DIAGNOSIS — R531 Weakness: Secondary | ICD-10-CM

## 2019-03-08 DIAGNOSIS — T796XXA Traumatic ischemia of muscle, initial encounter: Principal | ICD-10-CM

## 2019-03-08 LAB — COMPREHENSIVE METABOLIC PANEL
ALT: 41 U/L (ref 0–44)
AST: 185 U/L — ABNORMAL HIGH (ref 15–41)
Albumin: 3.2 g/dL — ABNORMAL LOW (ref 3.5–5.0)
Alkaline Phosphatase: 57 U/L (ref 38–126)
Anion gap: 14 (ref 5–15)
BUN: 12 mg/dL (ref 8–23)
CO2: 22 mmol/L (ref 22–32)
Calcium: 7.6 mg/dL — ABNORMAL LOW (ref 8.9–10.3)
Chloride: 104 mmol/L (ref 98–111)
Creatinine, Ser: 1.04 mg/dL (ref 0.61–1.24)
GFR calc Af Amer: >60 mL/min (ref >60)
GFR calc non Af Amer: >60 mL/min (ref >60)
Glucose, Bld: 79 mg/dL (ref 70–99)
Potassium: 2.8 mmol/L — ABNORMAL LOW (ref 3.5–5.1)
Sodium: 140 mmol/L (ref 135–145)
Total Bilirubin: 1.5 mg/dL — ABNORMAL HIGH (ref 0.3–1.2)
Total Protein: 6.3 g/dL — ABNORMAL LOW (ref 6.5–8.1)

## 2019-03-08 LAB — URINALYSIS, COMPLETE (UACMP) WITH MICROSCOPIC
Bacteria, UA: NONE SEEN
Bilirubin Urine: NEGATIVE
Glucose, UA: NEGATIVE mg/dL
Ketones, ur: 5 mg/dL — AB
Leukocytes,Ua: NEGATIVE
Nitrite: NEGATIVE
Protein, ur: 100 mg/dL — AB
Specific Gravity, Urine: 1.015 (ref 1.005–1.030)
pH: 5 (ref 5.0–8.0)

## 2019-03-08 LAB — CBC WITH DIFFERENTIAL/PLATELET
Abs Immature Granulocytes: 0.16 10*3/uL — ABNORMAL HIGH (ref 0.00–0.07)
Abs Immature Granulocytes: 0.13 10*3/uL — ABNORMAL HIGH (ref 0.00–0.07)
Basophils Absolute: 0 10*3/uL (ref 0.0–0.1)
Basophils Absolute: 0 10*3/uL (ref 0.0–0.1)
Basophils Relative: 0 %
Basophils Relative: 0 %
Eosinophils Absolute: 0 10*3/uL (ref 0.0–0.5)
Eosinophils Absolute: 0 10*3/uL (ref 0.0–0.5)
Eosinophils Relative: 0 %
Eosinophils Relative: 0 %
HCT: 32.4 % — ABNORMAL LOW (ref 39.0–52.0)
HCT: 32.7 % — ABNORMAL LOW (ref 39.0–52.0)
Hemoglobin: 10.9 g/dL — ABNORMAL LOW (ref 13.0–17.0)
Hemoglobin: 10.9 g/dL — ABNORMAL LOW (ref 13.0–17.0)
Immature Granulocytes: 1 %
Immature Granulocytes: 1 %
Lymphocytes Relative: 7 %
Lymphocytes Relative: 8 %
Lymphs Abs: 1.1 10*3/uL (ref 0.7–4.0)
Lymphs Abs: 1.1 10*3/uL (ref 0.7–4.0)
MCH: 31.7 pg (ref 26.0–34.0)
MCH: 31.6 pg (ref 26.0–34.0)
MCHC: 33.6 g/dL (ref 30.0–36.0)
MCHC: 33.3 g/dL (ref 30.0–36.0)
MCV: 94.2 fL (ref 80.0–100.0)
MCV: 94.8 fL (ref 80.0–100.0)
Monocytes Absolute: 0.6 10*3/uL (ref 0.1–1.0)
Monocytes Absolute: 0.7 10*3/uL (ref 0.1–1.0)
Monocytes Relative: 4 %
Monocytes Relative: 5 %
Neutro Abs: 13.3 10*3/uL — ABNORMAL HIGH (ref 1.7–7.7)
Neutro Abs: 11.9 10*3/uL — ABNORMAL HIGH (ref 1.7–7.7)
Neutrophils Relative %: 88 %
Neutrophils Relative %: 86 %
Platelets: 129 10*3/uL — ABNORMAL LOW (ref 150–400)
Platelets: 122 10*3/uL — ABNORMAL LOW (ref 150–400)
RBC: 3.44 MIL/uL — ABNORMAL LOW (ref 4.22–5.81)
RBC: 3.45 MIL/uL — ABNORMAL LOW (ref 4.22–5.81)
RDW: 13.1 % (ref 11.5–15.5)
RDW: 13.1 % (ref 11.5–15.5)
WBC: 15.2 10*3/uL — ABNORMAL HIGH (ref 4.0–10.5)
WBC: 13.9 10*3/uL — ABNORMAL HIGH (ref 4.0–10.5)
nRBC: 0 % (ref 0.0–0.2)
nRBC: 0 % (ref 0.0–0.2)

## 2019-03-08 LAB — URINE CULTURE: Culture: NO GROWTH

## 2019-03-08 LAB — TSH: TSH: 0.864 u[IU]/mL (ref 0.350–4.500)

## 2019-03-08 LAB — LACTIC ACID, PLASMA: Lactic Acid, Venous: 1.4 mmol/L (ref 0.5–1.9)

## 2019-03-08 LAB — HIV ANTIBODY (ROUTINE TESTING W REFLEX): HIV Screen 4th Generation wRfx: NONREACTIVE

## 2019-03-08 LAB — IRON AND TIBC
Iron: 36 ug/dL — ABNORMAL LOW (ref 45–182)
Saturation Ratios: 16 % — ABNORMAL LOW (ref 17.9–39.5)
TIBC: 223 ug/dL — ABNORMAL LOW (ref 250–450)
UIBC: 187 ug/dL

## 2019-03-08 LAB — VITAMIN B12: Vitamin B-12: 201 pg/mL (ref 180–914)

## 2019-03-08 LAB — RETICULOCYTES
Immature Retic Fract: 14.3 % (ref 2.3–15.9)
RBC.: 3.43 MIL/uL — ABNORMAL LOW (ref 4.22–5.81)
Retic Count, Absolute: 39.8 10*3/uL (ref 19.0–186.0)
Retic Ct Pct: 1.2 % (ref 0.4–3.1)

## 2019-03-08 LAB — FERRITIN: Ferritin: 862 ng/mL — ABNORMAL HIGH (ref 24–336)

## 2019-03-08 LAB — FOLATE: Folate: 12.6 ng/mL (ref >5.9)

## 2019-03-08 LAB — ECHOCARDIOGRAM COMPLETE
Height: 70.984 in
Weight: 2880 oz

## 2019-03-08 LAB — HEMOGLOBIN A1C
Hgb A1c MFr Bld: 5 % (ref 4.8–5.6)
Mean Plasma Glucose: 96.8 mg/dL

## 2019-03-08 LAB — LIPID PANEL
Cholesterol: 254 mg/dL — ABNORMAL HIGH (ref 0–200)
HDL: 111 mg/dL (ref >40)
LDL Cholesterol: 133 mg/dL — ABNORMAL HIGH (ref 0–99)
Total CHOL/HDL Ratio: 2.3 RATIO
Triglycerides: 51 mg/dL (ref <150)
VLDL: 10 mg/dL (ref 0–40)

## 2019-03-08 LAB — APTT: aPTT: 27 seconds (ref 24–36)

## 2019-03-08 LAB — PROTIME-INR
INR: 1.1 (ref 0.8–1.2)
Prothrombin Time: 13.7 seconds (ref 11.4–15.2)

## 2019-03-08 LAB — PROCALCITONIN
Procalcitonin: 6.39 ng/mL
Procalcitonin: 6.07 ng/mL

## 2019-03-08 LAB — URIC ACID: Uric Acid, Serum: 17.3 mg/dL — ABNORMAL HIGH (ref 3.7–8.6)

## 2019-03-08 LAB — C-REACTIVE PROTEIN: CRP: 0.9 mg/dL (ref <1.0)

## 2019-03-08 LAB — CK: Total CK: 6956 U/L — ABNORMAL HIGH (ref 49–397)

## 2019-03-08 LAB — SARS CORONAVIRUS 2 (TAT 6-24 HRS): SARS Coronavirus 2: NEGATIVE

## 2019-03-08 LAB — BRAIN NATRIURETIC PEPTIDE: B Natriuretic Peptide: 459 pg/mL — ABNORMAL HIGH (ref 0.0–100.0)

## 2019-03-08 MED ORDER — GADOBUTROL 1 MMOL/ML IV SOLN
7.5000 mL | Freq: Once | INTRAVENOUS | Status: AC | PRN
  Administered 2019-03-08: 04:00:00 7.5 mL via INTRAVENOUS

## 2019-03-08 MED ORDER — HYDRALAZINE HCL 20 MG/ML IJ SOLN
20.0000 mg | INTRAMUSCULAR | Status: DC | PRN
  Administered 2019-03-09 – 2019-03-11 (×2): 20 mg via INTRAVENOUS
  Filled 2019-03-08 (×3): qty 1

## 2019-03-08 MED ORDER — AMLODIPINE BESYLATE 10 MG PO TABS
10.0000 mg | ORAL_TABLET | Freq: Every day | ORAL | Status: DC
  Administered 2019-03-08 – 2019-03-14 (×7): 10 mg via ORAL
  Filled 2019-03-08 (×2): qty 2
  Filled 2019-03-08 (×5): qty 1

## 2019-03-08 NOTE — ED Notes (Signed)
Pt removed IV, new IV placed and wrapped in kerlix.  Wet bedding and gown changed.

## 2019-03-08 NOTE — ED Notes (Signed)
Pt c/o right leg pain. 10/10

## 2019-03-08 NOTE — ED Notes (Signed)
Pt provided with lunch and beverages.

## 2019-03-08 NOTE — Progress Notes (Signed)
*  PRELIMINARY RESULTS* Echocardiogram 2D Echocardiogram has been performed.  John Spears 03/08/2019, 9:43 AM

## 2019-03-08 NOTE — ED Notes (Signed)
Pt given a  Breakfast tray by this RN.

## 2019-03-08 NOTE — Progress Notes (Signed)
PT Cancellation Note  Patient Details Name: John Spears MRN: [id number] DOB: 1950-06-16   Cancelled Treatment:    Reason Eval/Treat Not Completed: Medical issues which prohibited therapy(Chart reviewed- BP remains elevated most of morning, 180s/80s or higher. Will hold off at this time and evaluate at later date/time once medically appropriate.)  1:40 PM, 03/08/19 Rosamaria Lints, PT, DPT Physical Therapist - [facility]  [phone] (ASCOM)    [name] C 03/08/2019, 1:40 PM

## 2019-03-08 NOTE — ED Notes (Signed)
Linen change, new gown on pt,

## 2019-03-08 NOTE — ED Notes (Signed)
Pt st "I got up and use tried to use the toilet, I am not going to fall" this RN advised and educated pt to please do not get out of bed w/o help. Call bell at bedside within arms reach. Urinal at bedside. [place] bed alarm placed and turned on.

## 2019-03-08 NOTE — ED Notes (Addendum)
Pt to exam room from MRI

## 2019-03-08 NOTE — ED Notes (Signed)
Patient in MRI at this time. 

## 2019-03-08 NOTE — ED Notes (Signed)
Patient resting quietly in room watching TV with no apparent acute distress.  Assisted to use urinal, expressing no other needs at this time.  Bed in lowest position, call light within reach, lights dimmed per pt request.  Will continue to monitor.

## 2019-03-08 NOTE — Progress Notes (Signed)
PROGRESS NOTE    John Spears  VZC:[id number] DOB: 08-02-1950 DOA: 03/07/2019 PCP: Derwood Kaplan, MD     Assessment & Plan:   Active Problems:   Lower extremity weakness   Traumatic rhabdomyolysis: secondary to a fall at home. Increased rate of IVFs today. CK level is trending up today.   HTN: uncontrolled. Started on amlodipine. Hydralazine prn. Use to take po anti-HTN but pt unsure of the medication and/or dosage  Lactic acidosis: with associated metabolic acidosis. Resolved  Leukocytosis: likely reactive. Will continue to monitor   Chronic alcoholism: alcohol cessation counseling. Monitor for signs/symptoms of w/drawal  History of CAD: with abnormal cardiac enzymes/abnormal EKG. Most likely elevation due to rhabdo. Echo shows EF 60-65%, grade I diastolic dysfunction & no wall function abnormalities. Continue on tele.  History of rheumatoid arthritis: not currently on any DMARD  History of CVA: Continue aspirin & statin   Hypokalemia: KCl repleated. Will continue to monitor   CKDI or II: GFR >60. Creatinine at baseline.  Transaminitis: secondary  to EtOH use. ALT is WNL & AST is still elevated. Will continue to monitor   Mild elevation in lipase: secondary to EtOH abuse. Currently no imaging or clinical signs of acute pancreatitis  Normocytic Anemia: likely related to EtOH use and marrow suppression/nutritional deficiency. No need for a transfusion at this time   Thrombocytopenia:  likely related to EtOH use and marrow suppression  Generalized weakness: PT/OT consulted    DVT prophylaxis: lovenox Code Status: full  Family Communication:  Disposition Plan:   Consultants:      Procedures:   Antimicrobials: n/a   Subjective: Pt c/o fatigue  Objective: Vitals:   03/08/19 1030 03/08/19 1229 03/08/19 1230 03/08/19 1430  BP: (!) 181/87 (!) 179/88 (!) 184/82 (!) 174/86  Pulse: 62  68 70  Resp: 18  (!) 21 18  Temp:      TempSrc:      SpO2:  99%  100% 99%  Weight:      Height:        Intake/Output Summary (Last 24 hours) at 03/08/2019 1522 Last data filed at 03/08/2019 1508 Gross per 24 hour  Intake 1200 ml  Output 500 ml  Net 700 ml   Filed Weights   03/07/19 1853  Weight: 81.6 kg    Examination:  General exam: Appears calm and comfortable  Respiratory system: Clear to auscultation. Respiratory effort normal. Cardiovascular system: S1 & S2+. No  rubs, gallops or clicks.  Gastrointestinal system: Abdomen is nondistended, soft and nontender.  Normal bowel sounds heard. Central nervous system: Alert and oriented. Moves all 4 extremities  Psychiatry: Judgement and insight appear normal. Flat mood and affect     Data Reviewed: I have personally reviewed following labs and imaging studies  CBC: Recent Labs  Lab 03/07/19 1856 03/08/19 0130 03/08/19 1234  WBC 18.4* 15.2* 13.9*  NEUTROABS 17.0* 13.3* 11.9*  HGB 12.1* 10.9* 10.9*  HCT 36.9* 32.4* 32.7*  MCV 95.6 94.2 94.8  PLT 151 129* 122*   Basic Metabolic Panel: Recent Labs  Lab 03/07/19 1856 03/08/19 1234  NA 140 140  K 3.0* 2.8*  CL 100 104  CO2 18* 22  GLUCOSE 115* 79  BUN 17 12  CREATININE 1.59* 1.04  CALCIUM 8.6* 7.6*   GFR: Estimated Creatinine Clearance: 74.4 mL/min (by C-G formula based on SCr of 1.04 mg/dL). Liver Function Tests: Recent Labs  Lab 03/07/19 1856 03/08/19 1234  AST 269* 185*  ALT 53* 41  ALKPHOS  77 57  BILITOT 1.2 1.5*  PROT 7.0 6.3*  ALBUMIN 3.8 3.2*   Recent Labs  Lab 03/07/19 2007  LIPASE 60*   Recent Labs  Lab 03/07/19 2007  AMMONIA 15   Coagulation Profile: Recent Labs  Lab 03/08/19 0130  INR 1.1   Cardiac Enzymes: Recent Labs  Lab 03/07/19 1856 03/08/19 1234  CKTOTAL 2,779* 6,956*   BNP (last 3 results) No results for input(s): PROBNP in the last 8760 hours. HbA1C: No results for input(s): HGBA1C in the last 72 hours. CBG: No results for input(s): GLUCAP in the last 168 hours. Lipid  Profile: Recent Labs    03/08/19 0130  CHOL 254*  HDL 111  LDLCALC 133*  TRIG 51  CHOLHDL 2.3   Thyroid Function Tests: Recent Labs    03/08/19 0130  TSH 0.864   Anemia Panel: Recent Labs    03/08/19 0130  VITAMINB12 201  FOLATE 12.6  FERRITIN 862*  TIBC 223*  IRON 36*  RETICCTPCT 1.2   Sepsis Labs: Recent Labs  Lab 03/07/19 2007 03/07/19 2213 03/08/19 0130 03/08/19 1234  PROCALCITON  --   --  6.39 6.07  LATICACIDVEN 5.8* 4.6*  --  1.4    Recent Results (from the past 240 hour(s))  SARS CORONAVIRUS 2 (TAT 6-24 HRS) Nasopharyngeal     Status: None   Collection Time: 03/07/19  8:07 PM   Specimen: Nasopharyngeal  Result Value Ref Range Status   SARS Coronavirus 2 NEGATIVE NEGATIVE Final    Comment: (NOTE) SARS-CoV-2 target nucleic acids are NOT DETECTED. The SARS-CoV-2 RNA is generally detectable in upper and lower respiratory specimens during the acute phase of infection. Negative results do not preclude SARS-CoV-2 infection, do not rule out co-infections with other pathogens, and should not be used as the sole basis for treatment or other patient management decisions. Negative results must be combined with clinical observations, patient history, and epidemiological information. The expected result is Negative. Fact Sheet for Patients: SugarRoll.be Fact Sheet for Healthcare Providers: https://www.woods-mathews.com/ This test is not yet approved or cleared by the [place] FDA and  has been authorized for detection and/or diagnosis of SARS-CoV-2 by FDA under an Emergency Use Authorization (EUA). This EUA will remain  in effect (meaning this test can be used) for the duration of the COVID-19 declaration under Section 56 4(b)(1) of the Act, 21 U.S.C. section 360bbb-3(b)(1), unless the authorization is terminated or revoked sooner. Performed at [facility], [place] [address] [place], [place] [address]     Culture, blood (routine x 2)     Status: None (Preliminary result)   Collection Time: 03/07/19 10:13 PM   Specimen: BLOOD  Result Value Ref Range Status   Specimen Description BLOOD RIGHT ANTECUBITAL  Final   Special Requests   Final    BOTTLES DRAWN AEROBIC AND ANAEROBIC Blood Culture results may not be optimal due to an excessive volume of blood received in culture bottles   Culture   Final    NO GROWTH < 12 HOURS Performed at [facility], [address], [place], [place] [address]    Report Status PENDING  Incomplete  Culture, blood (routine x 2)     Status: None (Preliminary result)   Collection Time: 03/07/19 10:13 PM   Specimen: BLOOD  Result Value Ref Range Status   Specimen Description BLOOD RIGHT ANTECUBITAL  Final   Special Requests   Final    BOTTLES DRAWN AEROBIC AND ANAEROBIC Blood Culture results may not  be optimal due to an excessive volume of blood received in culture bottles   Culture   Final    NO GROWTH < 12 HOURS Performed at [facility], [address], [place], [place] [address]    Report Status PENDING  Incomplete         Radiology Studies: DG Chest 1 View  Result Date: 03/07/2019 CLINICAL DATA:  Altered mental status. EXAM: CHEST  1 VIEW COMPARISON:  October 22, 2016. FINDINGS: The heart size and mediastinal contours are within normal limits. Both lungs are clear. The visualized skeletal structures are unremarkable. IMPRESSION: No active disease. Electronically Signed   By: Lupita Raider M.D.   On: 03/07/2019 21:33   DG Knee 1-2 Views Right  Result Date: 03/07/2019 CLINICAL DATA:  Right knee pain after fall. EXAM: RIGHT KNEE - 1-2 VIEW COMPARISON:  None. FINDINGS: No evidence of fracture or dislocation. Mild narrowing of medial joint space is noted. Soft tissues are unremarkable. IMPRESSION: Mild degenerative joint disease is noted medially. No definite fracture or dislocation is seen. Electronically Signed   By: Lupita Raider  M.D.   On: 03/07/2019 19:58   CT Head Wo Contrast  Result Date: 03/07/2019 CLINICAL DATA:  Larey Seat from a standing position. Trauma to the head and neck. EXAM: CT HEAD WITHOUT CONTRAST CT CERVICAL SPINE WITHOUT CONTRAST TECHNIQUE: Multidetector CT imaging of the head and cervical spine was performed following the standard protocol without intravenous contrast. Multiplanar CT image reconstructions of the cervical spine were also generated. COMPARISON:  February 2018. FINDINGS: CT HEAD FINDINGS Brain: Age advanced generalized atrophy. Chronic small-vessel ischemic changes of the hemispheric white matter. No sign of acute infarction, mass lesion, hemorrhage, hydrocephalus or extra-axial collection. Vascular: There is atherosclerotic calcification of the major vessels at the base of the brain. Skull: No skull fracture. Sinuses/Orbits: Clear/normal Other: Right forehead scalp lipoma. CT CERVICAL SPINE FINDINGS Alignment: Normal Skull base and vertebrae: No traumatic finding or focal bone finding. Soft tissues and spinal canal: Negative Disc levels: Degenerative spondylosis at C5-6 with disc space narrowing. No significant osteophytic stenosis of the canal or foramina. No advanced facet arthritis. Upper chest: Negative Other: None IMPRESSION: Head CT: No acute or traumatic finding. Atrophy and chronic small-vessel ischemic changes. Cervical spine CT: No acute or traumatic finding. Mild midcervical spondylosis. Electronically Signed   By: Paulina Fusi M.D.   On: 03/07/2019 20:19   CT Cervical Spine Wo Contrast  Result Date: 03/07/2019 CLINICAL DATA:  Larey Seat from a standing position. Trauma to the head and neck. EXAM: CT HEAD WITHOUT CONTRAST CT CERVICAL SPINE WITHOUT CONTRAST TECHNIQUE: Multidetector CT imaging of the head and cervical spine was performed following the standard protocol without intravenous contrast. Multiplanar CT image reconstructions of the cervical spine were also generated. COMPARISON:  February  2018. FINDINGS: CT HEAD FINDINGS Brain: Age advanced generalized atrophy. Chronic small-vessel ischemic changes of the hemispheric white matter. No sign of acute infarction, mass lesion, hemorrhage, hydrocephalus or extra-axial collection. Vascular: There is atherosclerotic calcification of the major vessels at the base of the brain. Skull: No skull fracture. Sinuses/Orbits: Clear/normal Other: Right forehead scalp lipoma. CT CERVICAL SPINE FINDINGS Alignment: Normal Skull base and vertebrae: No traumatic finding or focal bone finding. Soft tissues and spinal canal: Negative Disc levels: Degenerative spondylosis at C5-6 with disc space narrowing. No significant osteophytic stenosis of the canal or foramina. No advanced facet arthritis. Upper chest: Negative Other: None IMPRESSION: Head CT: No acute or traumatic finding. Atrophy and  chronic small-vessel ischemic changes. Cervical spine CT: No acute or traumatic finding. Mild midcervical spondylosis. Electronically Signed   By: Paulina Fusi M.D.   On: 03/07/2019 20:19   MR BRAIN W WO CONTRAST  Result Date: 03/08/2019 CLINICAL DATA:  Mental status change with CNS infection suspected EXAM: MRI HEAD WITHOUT AND WITH CONTRAST TECHNIQUE: Multiplanar, multiecho pulse sequences of the brain and surrounding structures were obtained without and with intravenous contrast. CONTRAST:  7.22mL GADAVIST GADOBUTROL 1 MMOL/ML IV SOLN COMPARISON:  Head CT from yesterday.  Brain MRI 04/05/2016 FINDINGS: Brain: Generalized atrophy that is prominent for age. Chronic small vessel ischemic type change in the deep cerebral white matter with chronic lacune in the right thalamus. No acute infarct, hemorrhage, hydrocephalus, or masslike finding. No abnormal intracranial enhancement. Vascular: Preserved flow voids. Skull and upper cervical spine: Negative for marrow lesion. Right frontal scalp lipoma. Sinuses/Orbits: Mild patchy ethmoid mucosal opacification. Chronic mild deformity along the  medial wall right orbit. Other: Motion degraded study such that fast brain protocol was used. IMPRESSION: 1. No acute finding. 2. Atrophy and chronic small vessel ischemia greater than expected for age. Electronically Signed   By: Marnee Spring M.D.   On: 03/08/2019 04:53   MR Lumbar Spine W Wo Contrast  Result Date: 03/08/2019 CLINICAL DATA:  Bilateral lower extremity pain EXAM: MRI LUMBAR SPINE WITHOUT AND WITH CONTRAST TECHNIQUE: Multiplanar and multiecho pulse sequences of the lumbar spine were obtained without and with intravenous contrast. CONTRAST:  7.87mL GADAVIST GADOBUTROL 1 MMOL/ML IV SOLN COMPARISON:  None. FINDINGS: Segmentation:  5 lumbar type vertebrae Alignment:  Normal Vertebrae: L3 isointense foci likely reflecting islands of red marrow. There is generalized marrow heterogeneity which is nonspecific but overall benign. Conus medullaris and cauda equina: Conus extends to the L1 level. Conus and cauda equina appear normal. Paraspinal and other soft tissues: Negative Disc levels: Diffuse congenital stenosis of the spinal canal with epidural lipomatosis effacing the thecal sac from L2-S1. T12- L1: Unremarkable. L1-L2: Mild disc bulging.  No degenerative impingement L2-L3: Mild disc bulging.  No degenerative impingement L3-L4: Disc bulging and mild facet spurring. No degenerative impingement L4-L5: Circumferential disc bulging and posterior element hypertrophy. Mild-to-moderate bilateral foraminal narrowing L5-S1:Incomplete segmentation with no degenerative changes IMPRESSION: 1. No acute finding. 2. Congenital spinal stenosis (from short pedicles) and epidural lipomatosis causes high-grade thecal sac effacement from L2-S1. Electronically Signed   By: Marnee Spring M.D.   On: 03/08/2019 04:49   DG Lumbar Spine 1 View  Result Date: 03/07/2019 CLINICAL DATA:  Low back pain after fall last week. EXAM: LUMBAR SPINE - 1 VIEW COMPARISON:  August 11, 2004. FINDINGS: Only AP projection was obtained.  Spondylolisthesis therefore cannot be excluded on this 1 projection. No definite fracture is noted, but this also cannot be completely excluded without lateral projection. IMPRESSION: Limited examination as only an AP projection is obtained. Spondylolisthesis or fracture cannot be excluded on the basis of this study. Lateral projection is recommended for further evaluation. Electronically Signed   By: Lupita Raider M.D.   On: 03/07/2019 19:58   ECHOCARDIOGRAM COMPLETE  Result Date: 03/08/2019   ECHOCARDIOGRAM REPORT   Patient Name:   John Spears Date of Exam: 03/08/2019 Medical Rec #:  [id number]      Height:       71.0 in Accession #:    [id number]     Weight:       180.0 lb Date of Birth:  1950-07-12      BSA:  2.02 m Patient Age:    68 years       BP:           197/94 mmHg Patient Gender: M              HR:           83 bpm. Exam Location:  [facility] Procedure: 2D Echo, Cardiac Doppler and Color Doppler Indications:     Chest pain 786.50  History:         Patient has prior history of Echocardiogram examinations, most                  recent 04/06/2016. Risk Factors:Hypertension.  Sonographer:     Cristela Blue RDCS (AE) Referring Phys:  [id number] Beulah Gandy A THOMAS Diagnosing Phys: Debbe Odea MD IMPRESSIONS  1. Left ventricular ejection fraction, by visual estimation, is 60 to 65%. The left ventricle has normal function. There is mildly increased left ventricular hypertrophy.  2. Left ventricular diastolic parameters are consistent with Grade I diastolic dysfunction (impaired relaxation).  3. The left ventricle has no regional wall motion abnormalities.  4. Global right ventricle has normal systolic function.The right ventricular size is normal. No increase in right ventricular wall thickness.  5. Left atrial size was mildly dilated.  6. Right atrial size was mildly dilated.  7. The mitral valve is normal in structure. No evidence of mitral valve regurgitation. No evidence of mitral stenosis.  8. The  tricuspid valve is normal in structure.  9. The aortic valve is normal in structure. Aortic valve regurgitation is not visualized. No evidence of aortic valve sclerosis or stenosis. 10. The pulmonic valve was not well visualized. Pulmonic valve regurgitation is not visualized. 11. The inferior vena cava is normal in size with greater than 50% respiratory variability, suggesting right atrial pressure of 3 mmHg. FINDINGS  Left Ventricle: Left ventricular ejection fraction, by visual estimation, is 60 to 65%. The left ventricle has normal function. The left ventricle has no regional wall motion abnormalities. The left ventricular internal cavity size was the left ventricle is normal in size. There is mildly increased left ventricular hypertrophy. Left ventricular diastolic parameters are consistent with Grade I diastolic dysfunction (impaired relaxation). Normal left atrial pressure. Right Ventricle: The right ventricular size is normal. No increase in right ventricular wall thickness. Global RV systolic function is has normal systolic function. Left Atrium: Left atrial size was mildly dilated. Right Atrium: Right atrial size was mildly dilated Pericardium: There is no evidence of pericardial effusion. Mitral Valve: The mitral valve is normal in structure. No evidence of mitral valve regurgitation. No evidence of mitral valve stenosis by observation. Tricuspid Valve: The tricuspid valve is normal in structure. Tricuspid valve regurgitation is not demonstrated. Aortic Valve: The aortic valve is normal in structure. Aortic valve regurgitation is not visualized. The aortic valve is structurally normal, with no evidence of sclerosis or stenosis. Aortic valve mean gradient measures 4.7 mmHg. Aortic valve peak gradient measures 7.7 mmHg. Aortic valve area, by VTI measures 1.85 cm. Pulmonic Valve: The pulmonic valve was not well visualized. Pulmonic valve regurgitation is not visualized. Pulmonic regurgitation is not  visualized. Aorta: The aortic root is normal in size and structure. Venous: The inferior vena cava was not well visualized. The inferior vena cava is normal in size with greater than 50% respiratory variability, suggesting right atrial pressure of 3 mmHg. IAS/Shunts: No atrial level shunt detected by color flow Doppler. There is no evidence of a patent foramen  ovale.  LEFT VENTRICLE PLAX 2D LVIDd:         3.87 cm  Diastology LVIDs:         2.74 cm  LV e' lateral:   6.42 cm/s LV PW:         1.24 cm  LV E/e' lateral: 9.5 LV IVS:        1.08 cm  LV e' medial:    6.31 cm/s LVOT diam:     2.10 cm  LV E/e' medial:  9.7 LV SV:         37 ml LV SV Index:   18.08 LVOT Area:     3.46 cm  RIGHT VENTRICLE RV Basal diam:  3.67 cm RV S prime:     19.80 cm/s TAPSE (M-mode): 3.9 cm LEFT ATRIUM             Index       RIGHT ATRIUM           Index LA diam:        4.00 cm 1.98 cm/m  RA Area:     23.40 cm LA Vol (A2C):   78.2 ml 38.79 ml/m RA Volume:   70.70 ml  35.07 ml/m LA Vol (A4C):   80.9 ml 40.12 ml/m LA Biplane Vol: 84.6 ml 41.96 ml/m  AORTIC VALVE                   PULMONIC VALVE AV Area (Vmax):    1.99 cm    PV Vmax:        1.01 m/s AV Area (Vmean):   1.61 cm    PV Peak grad:   4.1 mmHg AV Area (VTI):     1.85 cm    RVOT Peak grad: 7 mmHg AV Vmax:           139.00 cm/s AV Vmean:          98.033 cm/s AV VTI:            0.273 m AV Peak Grad:      7.7 mmHg AV Mean Grad:      4.7 mmHg LVOT Vmax:         79.80 cm/s LVOT Vmean:        45.600 cm/s LVOT VTI:          0.146 m LVOT/AV VTI ratio: 0.53  AORTA Ao Root diam: 3.30 cm MITRAL VALVE                         TRICUSPID VALVE MV Area (PHT): 2.93 cm              TR Peak grad:   24.6 mmHg MV PHT:        75.11 msec            TR Vmax:        248.00 cm/s MV Decel Time: 259 msec MV E velocity: 61.10 cm/s  103 cm/s  SHUNTS MV A velocity: 101.00 cm/s 70.3 cm/s Systemic VTI:  0.15 m MV E/A ratio:  0.60        1.5       Systemic Diam: 2.10 cm  Debbe Odea MD Electronically  signed by Debbe Odea MD Signature Date/Time: 03/08/2019/2:03:41 PM    Final    DG Hip Unilat W or Wo Pelvis 2-3 Views Left  Result Date: 03/07/2019 CLINICAL DATA:  Left hip pain after fall last week. EXAM: DG HIP (  WITH OR WITHOUT PELVIS) 2-3V LEFT COMPARISON:  None. FINDINGS: There is no evidence of hip fracture or dislocation. There is no evidence of arthropathy or other focal bone abnormality. IMPRESSION: Negative. Electronically Signed   By: Lupita Raider M.D.   On: 03/07/2019 20:01   DG Hip Unilat W or Wo Pelvis 2-3 Views Right  Result Date: 03/07/2019 CLINICAL DATA:  Right femur pain after fall last week. EXAM: DG HIP (WITH OR WITHOUT PELVIS) 2-3V RIGHT COMPARISON:  None. FINDINGS: Status post right hip arthroplasty. No fracture or dislocation is noted. Soft tissues are unremarkable. IMPRESSION: No acute abnormality is noted. Electronically Signed   By: Lupita Raider M.D.   On: 03/07/2019 20:00        Scheduled Meds: . amLODipine  10 mg Oral Daily  . aspirin EC  81 mg Oral Daily  . atorvastatin  40 mg Oral q1800  . enoxaparin (LOVENOX) injection  1 mg/kg Subcutaneous Q12H  . sodium chloride flush  3 mL Intravenous Q12H   Continuous Infusions: . sodium chloride Stopped (03/08/19 1251)  . piperacillin-tazobactam (ZOSYN)  IV Stopped (03/08/19 1451)     LOS: 1 day    Time spent: 33 mins    Charise Killian, MD [facility] Pager [phone]  If 7PM-7AM, please contact night-coverage [email or web address] Password [facility] 03/08/2019, 3:22 PM

## 2019-03-08 NOTE — ED Notes (Signed)
Patient transported to MRI 

## 2019-03-08 NOTE — ED Notes (Signed)
Patient placed on bedpan for bowel movement.

## 2019-03-08 NOTE — ED Notes (Addendum)
This RN at bedisde, pt st  "I am not going to fall I just want to sit on the bench". Pt removed monitor off, BP cuff, pulse ox. A/ox4. This RN placed monitor back on. Pt safely in bed with monitor on, posey bed alarm on. NAd noted by this RN. TV turned on. Pt calm and cooperative at this time.

## 2019-03-08 NOTE — ED Notes (Signed)
Pt pulled IV's out, pt st he wants to stand at the side of bed. Pt unable to stand with assitance. Pt shaking when trying to stand. Pt placed back on bed safely. Monitor on.  Pt st "I thought I was done with my medication". Pt A/Ox4.

## 2019-03-09 DIAGNOSIS — R7401 Elevation of levels of liver transaminase levels: Secondary | ICD-10-CM

## 2019-03-09 LAB — CBC
HCT: 33.1 % — ABNORMAL LOW (ref 39.0–52.0)
Hemoglobin: 11.2 g/dL — ABNORMAL LOW (ref 13.0–17.0)
MCH: 31.6 pg (ref 26.0–34.0)
MCHC: 33.8 g/dL (ref 30.0–36.0)
MCV: 93.5 fL (ref 80.0–100.0)
Platelets: 116 10*3/uL — ABNORMAL LOW (ref 150–400)
RBC: 3.54 MIL/uL — ABNORMAL LOW (ref 4.22–5.81)
RDW: 12.8 % (ref 11.5–15.5)
WBC: 12.1 10*3/uL — ABNORMAL HIGH (ref 4.0–10.5)
nRBC: 0 % (ref 0.0–0.2)

## 2019-03-09 LAB — COMPREHENSIVE METABOLIC PANEL
ALT: 56 U/L — ABNORMAL HIGH (ref 0–44)
AST: 242 U/L — ABNORMAL HIGH (ref 15–41)
Albumin: 3.4 g/dL — ABNORMAL LOW (ref 3.5–5.0)
Alkaline Phosphatase: 60 U/L (ref 38–126)
Anion gap: 16 — ABNORMAL HIGH (ref 5–15)
BUN: 10 mg/dL (ref 8–23)
CO2: 25 mmol/L (ref 22–32)
Calcium: 7.9 mg/dL — ABNORMAL LOW (ref 8.9–10.3)
Chloride: 100 mmol/L (ref 98–111)
Creatinine, Ser: 1.01 mg/dL (ref 0.61–1.24)
GFR calc Af Amer: >60 mL/min (ref >60)
GFR calc non Af Amer: >60 mL/min (ref >60)
Glucose, Bld: 82 mg/dL (ref 70–99)
Potassium: 3.7 mmol/L (ref 3.5–5.1)
Sodium: 141 mmol/L (ref 135–145)
Total Bilirubin: 1.5 mg/dL — ABNORMAL HIGH (ref 0.3–1.2)
Total Protein: 6.6 g/dL (ref 6.5–8.1)

## 2019-03-09 LAB — CK: Total CK: 8046 U/L — ABNORMAL HIGH (ref 49–397)

## 2019-03-09 LAB — PROCALCITONIN: Procalcitonin: 4.13 ng/mL

## 2019-03-09 MED ORDER — ENOXAPARIN SODIUM 40 MG/0.4ML ~~LOC~~ SOLN
40.0000 mg | SUBCUTANEOUS | Status: DC
  Administered 2019-03-10 – 2019-03-14 (×5): 40 mg via SUBCUTANEOUS
  Filled 2019-03-09 (×5): qty 0.4

## 2019-03-09 MED ORDER — HYDRALAZINE HCL 50 MG PO TABS
50.0000 mg | ORAL_TABLET | Freq: Three times a day (TID) | ORAL | Status: DC
  Administered 2019-03-09 – 2019-03-14 (×16): 50 mg via ORAL
  Filled 2019-03-09 (×17): qty 1

## 2019-03-09 MED ORDER — LORAZEPAM BOLUS VIA INFUSION
1.0000 mg | INTRAVENOUS | Status: DC | PRN
  Filled 2019-03-09: qty 1

## 2019-03-09 MED ORDER — METOPROLOL TARTRATE 5 MG/5ML IV SOLN: 5.0000 mg | Freq: Three times a day (TID) | INTRAVENOUS | Status: DC | PRN

## 2019-03-09 NOTE — Evaluation (Signed)
Physical Therapy Evaluation Patient Details Name: John Spears MRN: [id number] DOB: 06-15-1950 Today's Date: 03/09/2019   History of Present Illness  John Spears is a 34IZT who comes to [facility] on 1/12 c BLE pain. 4 days ago he had a mechanical fall where he fell and injured his hip and knee.  PMH: Alcohol abuse heart disease hyperlipidemia hypertension rheumatoid arthritis prior history of stroke. Pt admitted with Traumatic rhabdomyolysis.  Clinical Impression  Pt admitted with above diagnosis. Pt currently with functional limitations due to the deficits listed below (see "PT Problem List"). Upon entry, pt in bed, awake and agreeable to participate. The pt is alert and oriented x4, pleasant, conversational, and generally a fair historian although he offers minimal detail. Pt reports 10/10 pain, has high anxiety, apprehension, and guarding with attempts at passive or active movement of the right LE, tolerating some ranging of the hip and ankle, but keeps the knee fixed at ~30 degrees flexion. Rt knee appears mildly edematous compared to LLE with loss of sharp angles about the patella and patellar ligament, also a speckling of yellow bruising. Pt able to self assist RLE to sitting EOB, but requires minA to help with leg back into bed. Pt attempts STS transfers 4x with RW, but is unable to remain up for >5-6 seconds each attempt, despite self-selected TTWB on RLE. Palpation of BLE reveals soft compartments, and good tolerance in general, low associated pain supportive of intraarticular etiology. Functional mobility assessment demonstrates increased effort/time requirements, poor tolerance, and need for physical assistance, whereas the patient performed these at a higher level of independence PTA. Formerly pt was AMB without AD, but now is unable to mobilize with RW d/t pain. Thus far imaging studies have been largely unremarkable, however additional knee imaging may be more revealing, recommendations  deferred to orthopedics. Ortho consult may be helpful in determining a more focal diagnosis/intervention. Pt will benefit from skilled PT intervention to increase independence and safety with basic mobility in preparation for discharge to the venue listed below.       Follow Up Recommendations SNF;Supervision for mobility/OOB;Supervision - Intermittent    Equipment Recommendations  Rolling walker with 5" wheels    Recommendations for Other Services       Precautions / Restrictions Precautions Precautions: Fall Restrictions Weight Bearing Restrictions: No      Mobility  Bed Mobility Overal bed mobility: Needs Assistance Bed Mobility: Supine to Sit;Sit to Supine     Supine to sit: Min guard Sit to supine: Min assist   General bed mobility comments: help with leg back into bed; pt does not attempt leg hook technique as recommended.  Transfers Overall transfer level: Needs assistance Equipment used: Rolling walker (2 wheeled) Transfers: Sit to/from Stand Sit to Stand: Min guard;From elevated surface         General transfer comment: Performs 4x from EOB, but pain limits sustained standing even in Rt TTWB  Ambulation/Gait Ambulation/Gait assistance: (pt unable)              Stairs            Wheelchair Mobility    Modified Rankin (Stroke Patients Only)       Balance Overall balance assessment: Needs assistance                                           Pertinent Vitals/Pain Pain Assessment: 0-10 Pain  Score: 10-Worst pain ever Pain Location: Right knee Pain Descriptors / Indicators: Burning Pain Intervention(s): Limited activity within patient's tolerance;Monitored during session;Premedicated before session;Repositioned    Home Living Family/patient expects to be discharged to:: Private residence Living Arrangements: Alone Available Help at Discharge: Family;Available PRN/intermittently(Sister and Brother live in-town) Type of  Home: Apartment Home Access: Level entry     Home Layout: One level Home Equipment: None      Prior Function Level of Independence: Independent               Hand Dominance        Extremity/Trunk Assessment   Upper Extremity Assessment Upper Extremity Assessment: Overall WFL for tasks assessed    Lower Extremity Assessment Lower Extremity Assessment: Overall WFL for tasks assessed;RLE deficits/detail RLE Deficits / Details: painful tolerance of frontal plane hip ranging, will not allow ranging of knee 2/2 pain       Communication   Communication: No difficulties  Cognition Arousal/Alertness: Awake/alert Behavior During Therapy: Anxious;WFL for tasks assessed/performed;Impulsive Overall Cognitive Status: No family/caregiver present to determine baseline cognitive functioning                                        General Comments      Exercises     Assessment/Plan    PT Assessment Patient needs continued PT services  PT Problem List Decreased strength;Decreased range of motion;Decreased activity tolerance;Decreased balance;Decreased mobility;Decreased coordination;Decreased cognition;Decreased knowledge of use of DME;Decreased safety awareness;Decreased knowledge of precautions;Pain       PT Treatment Interventions DME instruction;Balance training;Gait training;Stair training;Functional mobility training;Therapeutic activities;Therapeutic exercise    PT Goals (Current goals can be found in the Care Plan section)  Acute Rehab PT Goals Patient Stated Goal: regain AMB capacity PT Goal Formulation: With patient Time For Goal Achievement: 03/23/19 Potential to Achieve Goals: Good    Frequency Min 2X/week   Barriers to discharge Inaccessible home environment;Decreased caregiver support lives alone and currenly non ambulatory    Co-evaluation               AM-PAC PT "6 Clicks" Mobility  Outcome Measure Help needed turning from  your back to your side while in a flat bed without using bedrails?: A Little Help needed moving from lying on your back to sitting on the side of a flat bed without using bedrails?: A Little Help needed moving to and from a bed to a chair (including a wheelchair)?: A Lot Help needed standing up from a chair using your arms (e.g., wheelchair or bedside chair)?: A Little Help needed to walk in hospital room?: Total Help needed climbing 3-5 steps with a railing? : Total 6 Click Score: 13    End of Session Equipment Utilized During Treatment: Gait belt Activity Tolerance: Patient limited by pain Patient left: in bed;with call bell/phone within reach;with bed alarm set;with family/visitor present Nurse Communication: Mobility status PT Visit Diagnosis: Unsteadiness on feet (R26.81);Difficulty in walking, not elsewhere classified (R26.2);Other abnormalities of gait and mobility (R26.89);Muscle weakness (generalized) (M62.81)    Time: 3664-4034 PT Time Calculation (min) (ACUTE ONLY): 19 min   Charges:   PT Evaluation $PT Eval Moderate Complexity: 1 Mod          11:42 AM, 03/09/19 Rosamaria Lints, PT, DPT Physical Therapist - [facility]  [phone] (ASCOM)    Katheryn Culliton C 03/09/2019, 11:35 AM

## 2019-03-09 NOTE — Progress Notes (Addendum)
PROGRESS NOTE    John Spears  FWY:[id number] DOB: February 25, 1950 DOA: 03/07/2019 PCP: Derwood Kaplan, MD     Assessment & Plan:   Active Problems:   Lower extremity weakness   Traumatic rhabdomyolysis: secondary to a fall at home. Continue on IVFs. CK level is trending up again today but Cr is trending down.   HTN: uncontrolled. Continue on amlodipine & start po hydralazine. IV Hydralazine prn. Use to take po anti-HTN but pt unsure of the medication and/or dosage  Lactic acidosis: with associated metabolic acidosis. Resolved  Leukocytosis: likely reactive. Will continue to monitor   Chronic alcoholism: alcohol cessation counseling. Monitor for signs/symptoms of w/drawal  History of CAD: with abnormal cardiac enzymes/abnormal EKG. Most likely elevation due to rhabdo. Echo shows EF 60-65%, grade I diastolic dysfunction & no wall function abnormalities. Continue on tele.  History of rheumatoid arthritis: not currently on any DMARD  History of CVA: Continue aspirin & statin   Hypokalemia: WNL today. Will continue to monitor   CKDI or II: GFR >60. Creatinine at baseline.  Transaminitis: secondary  to EtOH use. ALT & AST are both trending up today. Continue to hold statin. Will continue to monitor   Mild elevation in lipase: secondary to EtOH abuse. Currently no imaging or clinical signs of acute pancreatitis  Normocytic Anemia: likely related to EtOH use and marrow suppression/nutritional deficiency. No need for a transfusion at this time   Thrombocytopenia:  likely related to EtOH use and marrow suppression. Will continue to monitor   Generalized weakness: PT is recommending SNF    DVT prophylaxis: lovenox Code Status: full  Family Communication:  Disposition Plan:   Consultants:      Procedures:   Antimicrobials: n/a   Subjective: Pt c/o malaise   Objective: Vitals:   03/09/19 1130 03/09/19 1200 03/09/19 1241 03/09/19 1307  BP: (!) 171/92 (!)  187/79 (!) 172/91 (!) 179/90  Pulse: 70 66 87 100  Resp:    18  Temp:    99 F (37.2 C)  TempSrc:    Oral  SpO2: 99% 100% 99% 100%  Weight:      Height:        Intake/Output Summary (Last 24 hours) at 03/09/2019 1452 Last data filed at 03/09/2019 1436 Gross per 24 hour  Intake 50 ml  Output 900 ml  Net -850 ml   Filed Weights   03/07/19 1853  Weight: 81.6 kg    Examination:  General exam: Appears calm and comfortable  Respiratory system: Clear to auscultation. Respiratory effort normal. Cardiovascular system: S1 & S2+. No  rubs, gallops or clicks.  Gastrointestinal system: Abdomen is nondistended, soft and nontender.  Normal bowel sounds heard. Central nervous system: Alert and oriented. Moves all 4 extremities. Decreased ROM of b/l LE in all directions Psychiatry: Judgement and insight appear normal. Flat mood and affect     Data Reviewed: I have personally reviewed following labs and imaging studies  CBC: Recent Labs  Lab 03/07/19 1856 03/08/19 0130 03/08/19 1234 03/09/19 0522  WBC 18.4* 15.2* 13.9* 12.1*  NEUTROABS 17.0* 13.3* 11.9*  --   HGB 12.1* 10.9* 10.9* 11.2*  HCT 36.9* 32.4* 32.7* 33.1*  MCV 95.6 94.2 94.8 93.5  PLT 151 129* 122* 116*   Basic Metabolic Panel: Recent Labs  Lab 03/07/19 1856 03/08/19 1234 03/09/19 0522  NA 140 140 141  K 3.0* 2.8* 3.7  CL 100 104 100  CO2 18* 22 25  GLUCOSE 115* 79 82  BUN 17  12 10  CREATININE 1.59* 1.04 1.01  CALCIUM 8.6* 7.6* 7.9*   GFR: Estimated Creatinine Clearance: 76.6 mL/min (by C-G formula based on SCr of 1.01 mg/dL). Liver Function Tests: Recent Labs  Lab 03/07/19 1856 03/08/19 1234 03/09/19 0522  AST 269* 185* 242*  ALT 53* 41 56*  ALKPHOS 77 57 60  BILITOT 1.2 1.5* 1.5*  PROT 7.0 6.3* 6.6  ALBUMIN 3.8 3.2* 3.4*   Recent Labs  Lab 03/07/19 2007  LIPASE 60*   Recent Labs  Lab 03/07/19 2007  AMMONIA 15   Coagulation Profile: Recent Labs  Lab 03/08/19 0130  INR 1.1    Cardiac Enzymes: Recent Labs  Lab 03/07/19 1856 03/08/19 1234 03/09/19 1010  CKTOTAL 2,779* 6,956* 8,046*   BNP (last 3 results) No results for input(s): PROBNP in the last 8760 hours. HbA1C: Recent Labs    03/08/19 1234  HGBA1C 5.0   CBG: No results for input(s): GLUCAP in the last 168 hours. Lipid Profile: Recent Labs    03/08/19 0130  CHOL 254*  HDL 111  LDLCALC 133*  TRIG 51  CHOLHDL 2.3   Thyroid Function Tests: Recent Labs    03/08/19 0130  TSH 0.864   Anemia Panel: Recent Labs    03/08/19 0130  VITAMINB12 201  FOLATE 12.6  FERRITIN 862*  TIBC 223*  IRON 36*  RETICCTPCT 1.2   Sepsis Labs: Recent Labs  Lab 03/07/19 2007 03/07/19 2213 03/08/19 0130 03/08/19 1234 03/09/19 0522  PROCALCITON  --   --  6.39 6.07 4.13  LATICACIDVEN 5.8* 4.6*  --  1.4  --     Recent Results (from the past 240 hour(s))  SARS CORONAVIRUS 2 (TAT 6-24 HRS) Nasopharyngeal     Status: None   Collection Time: 03/07/19  8:07 PM   Specimen: Nasopharyngeal  Result Value Ref Range Status   SARS Coronavirus 2 NEGATIVE NEGATIVE Final    Comment: (NOTE) SARS-CoV-2 target nucleic acids are NOT DETECTED. The SARS-CoV-2 RNA is generally detectable in upper and lower respiratory specimens during the acute phase of infection. Negative results do not preclude SARS-CoV-2 infection, do not rule out co-infections with other pathogens, and should not be used as the sole basis for treatment or other patient management decisions. Negative results must be combined with clinical observations, patient history, and epidemiological information. The expected result is Negative. Fact Sheet for Patients: HairSlick.no Fact Sheet for Healthcare Providers: quierodirigir.com This test is not yet approved or cleared by the [place] FDA and  has been authorized for detection and/or diagnosis of SARS-CoV-2 by FDA under an Emergency Use  Authorization (EUA). This EUA will remain  in effect (meaning this test can be used) for the duration of the COVID-19 declaration under Section 56 4(b)(1) of the Act, 21 U.S.C. section 360bbb-3(b)(1), unless the authorization is terminated or revoked sooner. Performed at [facility], [address] [place], [place] [address]   Urine culture     Status: None   Collection Time: 03/07/19  8:07 PM   Specimen: Urine, Random  Result Value Ref Range Status   Specimen Description   Final    URINE, RANDOM Performed at [facility], [address] [place], [place] [address]    Special Requests   Final    NONE Performed at [facility], [address], [place], [place] [address]    Culture   Final    NO GROWTH Performed at [facility], [address], [place],  [place] [address]    Report Status 01/13[place]/2021 FINAL  Final  Culture, blood (routine x 2)     Status: None (Preliminary result)   Collection Time: 03/07/19 10:13 PM   Specimen: BLOOD  Result Value Ref Range Status   Specimen Description BLOOD RIGHT ANTECUBITAL  Final   Special Requests   Final    BOTTLES DRAWN AEROBIC AND ANAEROBIC Blood Culture results may not be optimal due to an excessive volume of blood received in culture bottles   Culture   Final    NO GROWTH 2 DAYS Performed at [facility], [address] [place], [place] [address]    Report Status PENDING  Incomplete  Culture, blood (routine x 2)     Status: None (Preliminary result)   Collection Time: 03/07/19 10:13 PM   Specimen: BLOOD  Result Value Ref Range Status   Specimen Description BLOOD RIGHT ANTECUBITAL  Final   Special Requests   Final    BOTTLES DRAWN AEROBIC AND ANAEROBIC Blood Culture results may not be optimal due to an excessive volume of blood received in culture bottles   Culture   Final    NO GROWTH 2 DAYS Performed at [facility], [address] [place], [place] [address]    Report  Status PENDING  Incomplete         Radiology Studies: DG Chest 1 View  Result Date: 03/07/2019 CLINICAL DATA:  Altered mental status. EXAM: CHEST  1 VIEW COMPARISON:  October 22, 2016. FINDINGS: The heart size and mediastinal contours are within normal limits. Both lungs are clear. The visualized skeletal structures are unremarkable. IMPRESSION: No active disease. Electronically Signed   By: Lupita Raider M.D.   On: 03/07/2019 21:33   DG Knee 1-2 Views Right  Result Date: 03/07/2019 CLINICAL DATA:  Right knee pain after fall. EXAM: RIGHT KNEE - 1-2 VIEW COMPARISON:  None. FINDINGS: No evidence of fracture or dislocation. Mild narrowing of medial joint space is noted. Soft tissues are unremarkable. IMPRESSION: Mild degenerative joint disease is noted medially. No definite fracture or dislocation is seen. Electronically Signed   By: Lupita Raider M.D.   On: 03/07/2019 19:58   CT Head Wo Contrast  Result Date: 03/07/2019 CLINICAL DATA:  Larey Seat from a standing position. Trauma to the head and neck. EXAM: CT HEAD WITHOUT CONTRAST CT CERVICAL SPINE WITHOUT CONTRAST TECHNIQUE: Multidetector CT imaging of the head and cervical spine was performed following the standard protocol without intravenous contrast. Multiplanar CT image reconstructions of the cervical spine were also generated. COMPARISON:  February 2018. FINDINGS: CT HEAD FINDINGS Brain: Age advanced generalized atrophy. Chronic small-vessel ischemic changes of the hemispheric white matter. No sign of acute infarction, mass lesion, hemorrhage, hydrocephalus or extra-axial collection. Vascular: There is atherosclerotic calcification of the major vessels at the base of the brain. Skull: No skull fracture. Sinuses/Orbits: Clear/normal Other: Right forehead scalp lipoma. CT CERVICAL SPINE FINDINGS Alignment: Normal Skull base and vertebrae: No traumatic finding or focal bone finding. Soft tissues and spinal canal: Negative Disc levels: Degenerative  spondylosis at C5-6 with disc space narrowing. No significant osteophytic stenosis of the canal or foramina. No advanced facet arthritis. Upper chest: Negative Other: None IMPRESSION: Head CT: No acute or traumatic finding. Atrophy and chronic small-vessel ischemic changes. Cervical spine CT: No acute or traumatic finding. Mild midcervical spondylosis. Electronically Signed   By: Paulina Fusi M.D.   On: 03/07/2019 20:19   CT Cervical Spine Wo Contrast  Result Date:  03/07/2019 CLINICAL DATA:  Larey Seat from a standing position. Trauma to the head and neck. EXAM: CT HEAD WITHOUT CONTRAST CT CERVICAL SPINE WITHOUT CONTRAST TECHNIQUE: Multidetector CT imaging of the head and cervical spine was performed following the standard protocol without intravenous contrast. Multiplanar CT image reconstructions of the cervical spine were also generated. COMPARISON:  February 2018. FINDINGS: CT HEAD FINDINGS Brain: Age advanced generalized atrophy. Chronic small-vessel ischemic changes of the hemispheric white matter. No sign of acute infarction, mass lesion, hemorrhage, hydrocephalus or extra-axial collection. Vascular: There is atherosclerotic calcification of the major vessels at the base of the brain. Skull: No skull fracture. Sinuses/Orbits: Clear/normal Other: Right forehead scalp lipoma. CT CERVICAL SPINE FINDINGS Alignment: Normal Skull base and vertebrae: No traumatic finding or focal bone finding. Soft tissues and spinal canal: Negative Disc levels: Degenerative spondylosis at C5-6 with disc space narrowing. No significant osteophytic stenosis of the canal or foramina. No advanced facet arthritis. Upper chest: Negative Other: None IMPRESSION: Head CT: No acute or traumatic finding. Atrophy and chronic small-vessel ischemic changes. Cervical spine CT: No acute or traumatic finding. Mild midcervical spondylosis. Electronically Signed   By: Paulina Fusi M.D.   On: 03/07/2019 20:19   MR BRAIN W WO CONTRAST  Result Date:  03/08/2019 CLINICAL DATA:  Mental status change with CNS infection suspected EXAM: MRI HEAD WITHOUT AND WITH CONTRAST TECHNIQUE: Multiplanar, multiecho pulse sequences of the brain and surrounding structures were obtained without and with intravenous contrast. CONTRAST:  7.5mL GADAVIST GADOBUTROL 1 MMOL/ML IV SOLN COMPARISON:  Head CT from yesterday.  Brain MRI 04/05/2016 FINDINGS: Brain: Generalized atrophy that is prominent for age. Chronic small vessel ischemic type change in the deep cerebral white matter with chronic lacune in the right thalamus. No acute infarct, hemorrhage, hydrocephalus, or masslike finding. No abnormal intracranial enhancement. Vascular: Preserved flow voids. Skull and upper cervical spine: Negative for marrow lesion. Right frontal scalp lipoma. Sinuses/Orbits: Mild patchy ethmoid mucosal opacification. Chronic mild deformity along the medial wall right orbit. Other: Motion degraded study such that fast brain protocol was used. IMPRESSION: 1. No acute finding. 2. Atrophy and chronic small vessel ischemia greater than expected for age. Electronically Signed   By: Marnee Spring M.D.   On: 03/08/2019 04:53   MR Lumbar Spine W Wo Contrast  Result Date: 03/08/2019 CLINICAL DATA:  Bilateral lower extremity pain EXAM: MRI LUMBAR SPINE WITHOUT AND WITH CONTRAST TECHNIQUE: Multiplanar and multiecho pulse sequences of the lumbar spine were obtained without and with intravenous contrast. CONTRAST:  7.70mL GADAVIST GADOBUTROL 1 MMOL/ML IV SOLN COMPARISON:  None. FINDINGS: Segmentation:  5 lumbar type vertebrae Alignment:  Normal Vertebrae: L3 isointense foci likely reflecting islands of red marrow. There is generalized marrow heterogeneity which is nonspecific but overall benign. Conus medullaris and cauda equina: Conus extends to the L1 level. Conus and cauda equina appear normal. Paraspinal and other soft tissues: Negative Disc levels: Diffuse congenital stenosis of the spinal canal with  epidural lipomatosis effacing the thecal sac from L2-S1. T12- L1: Unremarkable. L1-L2: Mild disc bulging.  No degenerative impingement L2-L3: Mild disc bulging.  No degenerative impingement L3-L4: Disc bulging and mild facet spurring. No degenerative impingement L4-L5: Circumferential disc bulging and posterior element hypertrophy. Mild-to-moderate bilateral foraminal narrowing L5-S1:Incomplete segmentation with no degenerative changes IMPRESSION: 1. No acute finding. 2. Congenital spinal stenosis (from short pedicles) and epidural lipomatosis causes high-grade thecal sac effacement from L2-S1. Electronically Signed   By: Marnee Spring M.D.   On: 03/08/2019 04:49   DG  Lumbar Spine 1 View  Result Date: 03/07/2019 CLINICAL DATA:  Low back pain after fall last week. EXAM: LUMBAR SPINE - 1 VIEW COMPARISON:  August 11, 2004. FINDINGS: Only AP projection was obtained. Spondylolisthesis therefore cannot be excluded on this 1 projection. No definite fracture is noted, but this also cannot be completely excluded without lateral projection. IMPRESSION: Limited examination as only an AP projection is obtained. Spondylolisthesis or fracture cannot be excluded on the basis of this study. Lateral projection is recommended for further evaluation. Electronically Signed   By: Lupita Raider M.D.   On: 03/07/2019 19:58   ECHOCARDIOGRAM COMPLETE  Result Date: 03/08/2019   ECHOCARDIOGRAM REPORT   Patient Name:   WILSON DUSENBERY Date of Exam: 03/08/2019 Medical Rec #:  [id number]      Height:       71.0 in Accession #:    [id number]     Weight:       180.0 lb Date of Birth:  11/05/1950      BSA:          2.02 m Patient Age:    68 years       BP:           197/94 mmHg Patient Gender: M              HR:           83 bpm. Exam Location:  [facility] Procedure: 2D Echo, Cardiac Doppler and Color Doppler Indications:     Chest pain 786.50  History:         Patient has prior history of Echocardiogram examinations, most                  recent  04/06/2016. Risk Factors:Hypertension.  Sonographer:     Cristela Blue RDCS (AE) Referring Phys:  [id number] Beulah Gandy A THOMAS Diagnosing Phys: Debbe Odea MD IMPRESSIONS  1. Left ventricular ejection fraction, by visual estimation, is 60 to 65%. The left ventricle has normal function. There is mildly increased left ventricular hypertrophy.  2. Left ventricular diastolic parameters are consistent with Grade I diastolic dysfunction (impaired relaxation).  3. The left ventricle has no regional wall motion abnormalities.  4. Global right ventricle has normal systolic function.The right ventricular size is normal. No increase in right ventricular wall thickness.  5. Left atrial size was mildly dilated.  6. Right atrial size was mildly dilated.  7. The mitral valve is normal in structure. No evidence of mitral valve regurgitation. No evidence of mitral stenosis.  8. The tricuspid valve is normal in structure.  9. The aortic valve is normal in structure. Aortic valve regurgitation is not visualized. No evidence of aortic valve sclerosis or stenosis. 10. The pulmonic valve was not well visualized. Pulmonic valve regurgitation is not visualized. 11. The inferior vena cava is normal in size with greater than 50% respiratory variability, suggesting right atrial pressure of 3 mmHg. FINDINGS  Left Ventricle: Left ventricular ejection fraction, by visual estimation, is 60 to 65%. The left ventricle has normal function. The left ventricle has no regional wall motion abnormalities. The left ventricular internal cavity size was the left ventricle is normal in size. There is mildly increased left ventricular hypertrophy. Left ventricular diastolic parameters are consistent with Grade I diastolic dysfunction (impaired relaxation). Normal left atrial pressure. Right Ventricle: The right ventricular size is normal. No increase in right ventricular wall thickness. Global RV systolic function is has normal systolic function. Left Atrium:  Left atrial size  was mildly dilated. Right Atrium: Right atrial size was mildly dilated Pericardium: There is no evidence of pericardial effusion. Mitral Valve: The mitral valve is normal in structure. No evidence of mitral valve regurgitation. No evidence of mitral valve stenosis by observation. Tricuspid Valve: The tricuspid valve is normal in structure. Tricuspid valve regurgitation is not demonstrated. Aortic Valve: The aortic valve is normal in structure. Aortic valve regurgitation is not visualized. The aortic valve is structurally normal, with no evidence of sclerosis or stenosis. Aortic valve mean gradient measures 4.7 mmHg. Aortic valve peak gradient measures 7.7 mmHg. Aortic valve area, by VTI measures 1.85 cm. Pulmonic Valve: The pulmonic valve was not well visualized. Pulmonic valve regurgitation is not visualized. Pulmonic regurgitation is not visualized. Aorta: The aortic root is normal in size and structure. Venous: The inferior vena cava was not well visualized. The inferior vena cava is normal in size with greater than 50% respiratory variability, suggesting right atrial pressure of 3 mmHg. IAS/Shunts: No atrial level shunt detected by color flow Doppler. There is no evidence of a patent foramen ovale.  LEFT VENTRICLE PLAX 2D LVIDd:         3.87 cm  Diastology LVIDs:         2.74 cm  LV e' lateral:   6.42 cm/s LV PW:         1.24 cm  LV E/e' lateral: 9.5 LV IVS:        1.08 cm  LV e' medial:    6.31 cm/s LVOT diam:     2.10 cm  LV E/e' medial:  9.7 LV SV:         37 ml LV SV Index:   18.08 LVOT Area:     3.46 cm  RIGHT VENTRICLE RV Basal diam:  3.67 cm RV S prime:     19.80 cm/s TAPSE (M-mode): 3.9 cm LEFT ATRIUM             Index       RIGHT ATRIUM           Index LA diam:        4.00 cm 1.98 cm/m  RA Area:     23.40 cm LA Vol (A2C):   78.2 ml 38.79 ml/m RA Volume:   70.70 ml  35.07 ml/m LA Vol (A4C):   80.9 ml 40.12 ml/m LA Biplane Vol: 84.6 ml 41.96 ml/m  AORTIC VALVE                    PULMONIC VALVE AV Area (Vmax):    1.99 cm    PV Vmax:        1.01 m/s AV Area (Vmean):   1.61 cm    PV Peak grad:   4.1 mmHg AV Area (VTI):     1.85 cm    RVOT Peak grad: 7 mmHg AV Vmax:           139.00 cm/s AV Vmean:          98.033 cm/s AV VTI:            0.273 m AV Peak Grad:      7.7 mmHg AV Mean Grad:      4.7 mmHg LVOT Vmax:         79.80 cm/s LVOT Vmean:        45.600 cm/s LVOT VTI:          0.146 m LVOT/AV VTI ratio: 0.53  AORTA Ao Root diam: 3.30  cm MITRAL VALVE                         TRICUSPID VALVE MV Area (PHT): 2.93 cm              TR Peak grad:   24.6 mmHg MV PHT:        75.11 msec            TR Vmax:        248.00 cm/s MV Decel Time: 259 msec MV E velocity: 61.10 cm/s  103 cm/s  SHUNTS MV A velocity: 101.00 cm/s 70.3 cm/s Systemic VTI:  0.15 m MV E/A ratio:  0.60        1.5       Systemic Diam: 2.10 cm  Debbe Odea MD Electronically signed by Debbe Odea MD Signature Date/Time: 03/08/2019/2:03:41 PM    Final    DG Hip Unilat W or Wo Pelvis 2-3 Views Left  Result Date: 03/07/2019 CLINICAL DATA:  Left hip pain after fall last week. EXAM: DG HIP (WITH OR WITHOUT PELVIS) 2-3V LEFT COMPARISON:  None. FINDINGS: There is no evidence of hip fracture or dislocation. There is no evidence of arthropathy or other focal bone abnormality. IMPRESSION: Negative. Electronically Signed   By: Lupita Raider M.D.   On: 03/07/2019 20:01   DG Hip Unilat W or Wo Pelvis 2-3 Views Right  Result Date: 03/07/2019 CLINICAL DATA:  Right femur pain after fall last week. EXAM: DG HIP (WITH OR WITHOUT PELVIS) 2-3V RIGHT COMPARISON:  None. FINDINGS: Status post right hip arthroplasty. No fracture or dislocation is noted. Soft tissues are unremarkable. IMPRESSION: No acute abnormality is noted. Electronically Signed   By: Lupita Raider M.D.   On: 03/07/2019 20:00        Scheduled Meds: . amLODipine  10 mg Oral Daily  . aspirin EC  81 mg Oral Daily  . enoxaparin (LOVENOX) injection  1 mg/kg  Subcutaneous Q12H  . hydrALAZINE  50 mg Oral Q8H  . sodium chloride flush  3 mL Intravenous Q12H   Continuous Infusions: . sodium chloride 100 mL/hr at 03/09/19 1054  . piperacillin-tazobactam (ZOSYN)  IV Stopped (03/09/19 1356)     LOS: 2 days    Time spent: 30 mins    Charise Killian, MD [facility] Pager [phone]  If 7PM-7AM, please contact night-coverage [email or web address] Password [facility] 03/09/2019, 2:52 PM

## 2019-03-09 NOTE — ED Notes (Signed)
Patient moved into hospital bed. Repositioned and warm blankets applied. Denies further needs at this time.

## 2019-03-09 NOTE — ED Notes (Signed)
Patient incontinent of urine. Trying to move to the end of the bed. Assisted by this RN and Gaspar Garbe, RN student to use urinal. New bed pad placed. Bed alarm in place. Patient repositioned in bed. Denies further needs at this time.

## 2019-03-09 NOTE — Progress Notes (Signed)
Patient had a 22 beat run SVT with a pair of pvc's. MD notified, no new orders. Patient stable, no symptoms.

## 2019-03-09 NOTE — ED Notes (Signed)
Patient is sleeping in room.  Bed is in lowest position, call light within reach.  Will continue to monitor.

## 2019-03-09 NOTE — Evaluation (Signed)
Occupational Therapy Evaluation Patient Details Name: John Spears MRN: [id number] DOB: 1950-03-16 Today's Date: 03/09/2019    History of Present Illness John Spears is a 08MVH who comes to [facility] on 1/12 c BLE pain. 4 days ago he had a mechanical fall where he fell and injured his hip and knee.  PMH: Alcohol abuse heart disease hyperlipidemia hypertension rheumatoid arthritis prior history of stroke. Pt admitted with Traumatic rhabdomyolysis.   Clinical Impression   John Spears was seen for OT evaluation this date. Prior to hospital admission, pt was independent for ADL management. He endorses ambulating without AD and denies additional falls history in the past year. Pt lives alone in a 1-level apartment home with 4-5 steps to enter. Pt states his family assists with IADL management including shopping and driving. Currently pt demonstrates impairments as described below (See OT problem list) which functionally limit his ability to perform ADL/self-care tasks. Pt currently requires moderate assistance for LB ADL management as well as min assist for functional transfers.  Pt would benefit from skilled OT services to address noted impairments and functional limitations (see below for any additional details) in order to maximize safety and independence while minimizing falls risk and caregiver burden.  Upon hospital discharge, recommend STR to maximize pt safety and return to PLOF.     Follow Up Recommendations  SNF;Supervision - Intermittent    Equipment Recommendations  3 in 1 bedside commode    Recommendations for Other Services       Precautions / Restrictions Precautions Precautions: Fall Restrictions Weight Bearing Restrictions: No      Mobility Bed Mobility Overal bed mobility: Needs Assistance             General bed mobility comments: Deferred. Pt declines mobility this date 2/2 pain in BLE. Per physical therapy note, pt required min guard for sup>sit and min assist for  sup>supine.  Transfers Overall transfer level: Needs assistance                    Balance Overall balance assessment: Mild deficits observed, not formally tested   Sitting balance-Leahy Scale: Fair Sitting balance - Comments: Pt able to briefly come to long sitting in bed, but is unable to maintain this position during UB MMT.                                   ADL either performed or assessed with clinical judgement   ADL Overall ADL's : Needs assistance/impaired                                       General ADL Comments: Pt significantly functionally limited by increased pain and decreased ROM in BLE with RLE>LLE this date. Requires moderate assistance for LB ADL management in a seated position to maximize pt comfort and safety with ADL tasks. Set up assist for UB ADL management.     Vision Baseline Vision/History: Wears glasses Wears Glasses: Reading only Patient Visual Report: No change from baseline       Perception     Praxis      Pertinent Vitals/Pain Pain Score: 10-Worst pain ever Pain Location: Right knee Pain Descriptors / Indicators: Aching;Burning;Sore Pain Intervention(s): Limited activity within patient's tolerance;Monitored during session     Hand Dominance Right   Extremity/Trunk Assessment Upper Extremity Assessment Upper  Extremity Assessment: Overall WFL for tasks assessed(FMC slowed and pt tremulous this date. Strength/ROM grossly WFLs)   Lower Extremity Assessment Lower Extremity Assessment: Defer to PT evaluation;RLE deficits/detail RLE Deficits / Details: Pt c/o significant pain in RLE. Declines functional mobility assessment. RLE: Unable to fully assess due to pain       Communication Communication Communication: No difficulties   Cognition Arousal/Alertness: Awake/alert Behavior During Therapy: Anxious;WFL for tasks assessed/performed;Flat affect Overall Cognitive Status: No family/caregiver present  to determine baseline cognitive functioning                                 General Comments: Pt participates to limited degree during OT session. Endorses significant pain, which may have been limiting factor. Able to follow VCs with occasional increased time to process. Will continue to assess.   General Comments       Exercises     Shoulder Instructions      Home Living Family/patient expects to be discharged to:: Private residence Living Arrangements: Alone Available Help at Discharge: Family;Available PRN/intermittently Type of Home: Apartment Home Access: Stairs to enter Entrance Stairs-Number of Steps: Pt endorses 4-5 steps to get onto his front porch   Home Layout: One level     Bathroom Shower/Tub: Producer, television/film/video: Standard     Home Equipment: None          Prior Functioning/Environment Level of Independence: Independent        Comments: Pt reports he is independent with AD management. He does not drive, and has siblings who assist with grocery shopping. He endorses independence with medicaiton management, but states he "just stopped" having his blood pressure medication refilled, so hasn't been taking it regularly.        OT Problem List: Decreased strength;Decreased coordination;Pain;Decreased activity tolerance;Decreased safety awareness;Impaired balance (sitting and/or standing);Decreased knowledge of use of DME or AE      OT Treatment/Interventions: Self-care/ADL training;Therapeutic exercise;Therapeutic activities;DME and/or AE instruction;Patient/family education;Balance training;Energy conservation    OT Goals(Current goals can be found in the care plan section) Acute Rehab OT Goals Patient Stated Goal: To have less pain and be more independent OT Goal Formulation: With patient Time For Goal Achievement: 03/23/19 Potential to Achieve Goals: Good ADL Goals Pt Will Perform Lower Body Dressing: sit to/from stand;with  set-up;with supervision;with adaptive equipment(With LRAD PRN for improved safety and functional independence.) Pt Will Transfer to Toilet: bedside commode;ambulating;with modified independence;regular height toilet Pt Will Perform Toileting - Clothing Manipulation and hygiene: sit to/from stand;with set-up;with supervision;with adaptive equipment(With LRAD PRN for improved safety and functional independence.) Additional ADL Goal #1: Pt will independently verbalize a plan to implement at least 3 learned falls prevention strategies into his daily routines/home environment for improved safety and functional indepenence upon hospital DC.  OT Frequency: Min 1X/week   Barriers to D/C: Inaccessible home environment;Decreased caregiver support          Co-evaluation              AM-PAC OT "6 Clicks" Daily Activity     Outcome Measure Help from another person eating meals?: None Help from another person taking care of personal grooming?: A Little Help from another person toileting, which includes using toliet, bedpan, or urinal?: A Lot Help from another person bathing (including washing, rinsing, drying)?: A Lot Help from another person to put on and taking off regular upper body clothing?: A Little Help from  another person to put on and taking off regular lower body clothing?: A Lot 6 Click Score: 16   End of Session    Activity Tolerance: Patient limited by pain Patient left: in bed;with call bell/phone within reach;with bed alarm set  OT Visit Diagnosis: Other abnormalities of gait and mobility (R26.89);Pain Pain - Right/Left: Right Pain - part of body: Hip;Knee;Leg;Ankle and joints of foot                Time: 7829-5621 OT Time Calculation (min): 14 min Charges:  OT General Charges $OT Visit: 1 Visit OT Evaluation $OT Eval Moderate Complexity: 1 Mod  Shara Blazing, M.S., OTR/L Ascom: [phone] 03/09/19, 3:54 PM

## 2019-03-09 NOTE — Progress Notes (Signed)
HR 100's. Tele called and Patient had run of SVT, MD notified, no new orders.

## 2019-03-09 NOTE — ED Notes (Signed)
Patient attempted to get out of bed, followed instructions when told to lay back down but tries to get up again soon after.  He states his right leg is hurting.  Linens were wet and changed, dry brief and gown put on.  Will continue to monitor.

## 2019-03-09 NOTE — ED Notes (Signed)
Pt found to be incontinent of urine with saturated gown and bed linens. Pt cleaned of urine; bed linens changed and incontinence brief applied at this time by Lupe Carney, RN and Gwynneth Munson, RN.

## 2019-03-09 NOTE — ED Notes (Signed)
Patient is sleeping in room.  Bed is in lowest position and the call light within reach.  Will continue to monitor.

## 2019-03-10 ENCOUNTER — Other Ambulatory Visit: Payer: Self-pay

## 2019-03-10 LAB — CBC
HCT: 32.2 % — ABNORMAL LOW (ref 39.0–52.0)
Hemoglobin: 11.1 g/dL — ABNORMAL LOW (ref 13.0–17.0)
MCH: 31.6 pg (ref 26.0–34.0)
MCHC: 34.5 g/dL (ref 30.0–36.0)
MCV: 91.7 fL (ref 80.0–100.0)
Platelets: 110 10*3/uL — ABNORMAL LOW (ref 150–400)
RBC: 3.51 MIL/uL — ABNORMAL LOW (ref 4.22–5.81)
RDW: 13.1 % (ref 11.5–15.5)
WBC: 10.7 10*3/uL — ABNORMAL HIGH (ref 4.0–10.5)
nRBC: 0 % (ref 0.0–0.2)

## 2019-03-10 LAB — COMPREHENSIVE METABOLIC PANEL
ALT: 46 U/L — ABNORMAL HIGH (ref 0–44)
AST: 180 U/L — ABNORMAL HIGH (ref 15–41)
Albumin: 3.1 g/dL — ABNORMAL LOW (ref 3.5–5.0)
Alkaline Phosphatase: 65 U/L (ref 38–126)
Anion gap: 15 (ref 5–15)
BUN: 10 mg/dL (ref 8–23)
CO2: 22 mmol/L (ref 22–32)
Calcium: 7.7 mg/dL — ABNORMAL LOW (ref 8.9–10.3)
Chloride: 99 mmol/L (ref 98–111)
Creatinine, Ser: 0.85 mg/dL (ref 0.61–1.24)
GFR calc Af Amer: >60 mL/min (ref >60)
GFR calc non Af Amer: >60 mL/min (ref >60)
Glucose, Bld: 75 mg/dL (ref 70–99)
Potassium: 2.6 mmol/L — CL (ref 3.5–5.1)
Sodium: 136 mmol/L (ref 135–145)
Total Bilirubin: 1.5 mg/dL — ABNORMAL HIGH (ref 0.3–1.2)
Total Protein: 6.4 g/dL — ABNORMAL LOW (ref 6.5–8.1)

## 2019-03-10 LAB — CK: Total CK: 5370 U/L — ABNORMAL HIGH (ref 49–397)

## 2019-03-10 MED ORDER — METOPROLOL SUCCINATE ER 25 MG PO TB24
25.0000 mg | ORAL_TABLET | Freq: Every day | ORAL | Status: DC
  Administered 2019-03-10 – 2019-03-14 (×5): 25 mg via ORAL
  Filled 2019-03-10 (×5): qty 1

## 2019-03-10 MED ORDER — CHLORDIAZEPOXIDE HCL 5 MG PO CAPS: 5.0000 mg | ORAL_CAPSULE | Freq: Three times a day (TID) | ORAL | Status: DC | PRN

## 2019-03-10 MED ORDER — CHLORDIAZEPOXIDE HCL 5 MG PO CAPS
5.0000 mg | ORAL_CAPSULE | Freq: Three times a day (TID) | ORAL | Status: DC
  Administered 2019-03-10 – 2019-03-14 (×13): 5 mg via ORAL
  Filled 2019-03-10 (×13): qty 1

## 2019-03-10 MED ORDER — POTASSIUM CHLORIDE CRYS ER 20 MEQ PO TBCR
40.0000 meq | EXTENDED_RELEASE_TABLET | Freq: Two times a day (BID) | ORAL | Status: AC
  Administered 2019-03-10 (×2): 40 meq via ORAL
  Filled 2019-03-10 (×2): qty 2

## 2019-03-10 MED ORDER — LORAZEPAM 2 MG/ML IJ SOLN
1.0000 mg | INTRAMUSCULAR | Status: DC | PRN
  Administered 2019-03-11: 1 mg via INTRAVENOUS
  Filled 2019-03-10: qty 1

## 2019-03-10 MED ORDER — TRAMADOL HCL 50 MG PO TABS
50.0000 mg | ORAL_TABLET | Freq: Four times a day (QID) | ORAL | Status: DC | PRN
  Administered 2019-03-10: 50 mg via ORAL
  Filled 2019-03-10: qty 1

## 2019-03-10 NOTE — Care Management Important Message (Signed)
Important Message  Patient Details  Name: John Spears MRN: [id number] Date of Birth: 05-May-1950   Medicare Important Message Given:  Yes     [name] 03/10/2019, 11:45 AM

## 2019-03-10 NOTE — TOC Initial Note (Signed)
Transition of Care [facility]) - Initial/Assessment Note    Patient Details  Name: John Spears MRN: [id number] Date of Birth: 09-29-1950  Transition of Care [facility]) CM/SW Contact:    Su Hilt, RN Phone Number: 03/10/2019, 9:57 AM  Clinical Narrative:                 Met with the patient to discuss DC plan and needs, he is agreeable to go to SNF for rehab and start a bed search, he lives alone in an apartment  FL2, PASSR and bed search done, will review offers once obtained  Expected Discharge Plan: [place] Barriers to Discharge: Continued Medical Work up   Patient Goals and CMS Choice Patient states their goals for this hospitalization and ongoing recovery are:: get well CMS Medicare.gov Compare Post Acute Care list provided to:: Patient Choice offered to / list presented to : Patient  Expected Discharge Plan and Services Expected Discharge Plan: [place] Choice: [place] Living arrangements for the past 2 months: Apartment                                      Prior Living Arrangements/Services Living arrangements for the past 2 months: Apartment Lives with:: Self Patient language and need for interpreter reviewed:: Yes Do you feel safe going back to the place where you live?: Yes      Need for Family Participation in Patient Care: No (Comment) Care giver support system in place?: Yes (comment)   Criminal Activity/Legal Involvement Pertinent to Current Situation/Hospitalization: No - Comment as needed  Activities of Daily Living      Permission Sought/Granted Permission sought to share information with : Facility Sport and exercise psychologist, Tourist information centre manager, Family Supports Permission granted to share information with : Yes, Verbal Permission Granted              Emotional Assessment Appearance:: Appears stated age Attitude/Demeanor/Rapport: Engaged Affect (typically observed):  Appropriate Orientation: : Oriented to Self, Oriented to Place, Oriented to  Time, Oriented to Situation Alcohol / Substance Use: Not Applicable Psych Involvement: No (comment)  Admission diagnosis:  Alcoholic ketoacidosis [R94.5] Weakness [R53.1] Fall [W19.XXXA] Elevated troponin [R77.8] Lower extremity weakness [R29.898] Contusion of right hip, initial encounter [S70.01XA] Fall, initial encounter [W19.XXXA] Traumatic rhabdomyolysis, initial encounter ([place]) [T79.6XXA] Altered mental status, unspecified altered mental status type [R41.82] Patient Active Problem List   Diagnosis Date Noted  . Transaminitis   . Traumatic rhabdomyolysis ([place])   . Thrombocytopenia ([place])   . Weakness   . Lower extremity weakness 03/07/2019  . Acute CVA (cerebrovascular accident) ([place]) 04/05/2016   PCP:  Marden Noble, MD Pharmacy:   [facility] [address]), [place]) [place] [address] Phone: [phone] Fax: [phone]  [id number] [place], [place] [address] [place]Phone: [phone]: [phone]    Social Determinants of Health (SDOH) Interventions    Readmission Risk Interventions No flowsheet data found.

## 2019-03-10 NOTE — NC FL2 (Signed)
[place] LEVEL OF CARE SCREENING TOOL     IDENTIFICATION  Patient Name: John Spears Birthdate: 07-Jan-1951 Sex: male Admission Date (Current Location): 03/07/2019  [place] and [place] Number:  Engineering geologist and Address:  [facility], [address] [place], [place] [address]      Provider Number: [id number]  Attending Physician Name and Address:  Wyvonnia Dusky, MD  Relative Name and Phone Number:  Lorelee Market [phone]    Current Level of Care: Hospital Recommended Level of Care: [place] Prior Approval Number:    Date Approved/Denied:   PASRR Number: [id number]  Discharge Plan: SNF    Current Diagnoses: Patient Active Problem List   Diagnosis Date Noted  . Transaminitis   . Traumatic rhabdomyolysis ([place])   . Thrombocytopenia ([place])   . Weakness   . Lower extremity weakness 03/07/2019  . Acute CVA (cerebrovascular accident) ([place]) 04/05/2016    Orientation RESPIRATION BLADDER Height & Weight     Self, Time, Situation, Place  Normal Continent Weight: 81.8 kg Height:  5' 10.98" (180.3 cm)  BEHAVIORAL SYMPTOMS/MOOD NEUROLOGICAL BOWEL NUTRITION STATUS      Continent Diet(regular)  AMBULATORY STATUS COMMUNICATION OF NEEDS Skin   Extensive Assist Verbally Normal                       Personal Care Assistance Level of Assistance  Bathing, Dressing Bathing Assistance: Limited assistance   Dressing Assistance: Limited assistance     Functional Limitations Info             SPECIAL CARE FACTORS FREQUENCY  PT (By licensed PT)     PT Frequency: 5 times per week              Contractures Contractures Info: Not present    Additional Factors Info  Allergies, Code Status Code Status Info: Full Allergies Info: NKDA           Current Medications (03/10/2019):  This is the current hospital active medication list Current Facility-Administered Medications  Medication Dose  Route Frequency Provider Last Rate Last Admin  . 0.9 %  sodium chloride infusion   Intravenous Continuous Wyvonnia Dusky, MD 100 mL/hr at 03/10/19 0917 New Bag at 03/10/19 0917  . acetaminophen (TYLENOL) tablet 650 mg  650 mg Oral Q6H PRN Clance Boll, MD   650 mg at 03/08/19 0843   Or  . acetaminophen (TYLENOL) suppository 650 mg  650 mg Rectal Q6H PRN Myles Rosenthal A, MD      . amLODipine (NORVASC) tablet 10 mg  10 mg Oral Daily Wyvonnia Dusky, MD   10 mg at 03/10/19 0913  . aspirin EC tablet 81 mg  81 mg Oral Daily Myles Rosenthal A, MD   81 mg at 03/10/19 0913  . enoxaparin (LOVENOX) injection 40 mg  40 mg Subcutaneous Q24H Wyvonnia Dusky, MD   40 mg at 03/10/19 0913  . hydrALAZINE (APRESOLINE) injection 20 mg  20 mg Intravenous Q4H PRN Wyvonnia Dusky, MD   20 mg at 03/09/19 1226  . hydrALAZINE (APRESOLINE) tablet 50 mg  50 mg Oral Q8H Wyvonnia Dusky, MD   50 mg at 03/10/19 0636  . LORazepam (ATIVAN) injection 1 mg  1 mg Intravenous Q4H PRN Wyvonnia Dusky, MD      . metoprolol tartrate (LOPRESSOR) injection 5 mg  5 mg Intravenous Q8H PRN Wyvonnia Dusky, MD      .  morphine 2 MG/ML injection 2 mg  2 mg Intravenous Q4H PRN Lurline Del, MD   2 mg at 03/10/19 0435  . ondansetron (ZOFRAN) tablet 4 mg  4 mg Oral Q6H PRN Lurline Del, MD       Or  . ondansetron [facility]) injection 4 mg  4 mg Intravenous Q6H PRN Lurline Del, MD      . senna-docusate (Senokot-S) tablet 1 tablet  1 tablet Oral QHS PRN Skip Mayer A, MD      . sodium chloride flush (NS) 0.9 % injection 3 mL  3 mL Intravenous Q12H Lurline Del, MD   3 mL at 03/10/19 7011     Discharge Medications: Please see discharge summary for a list of discharge medications.  Relevant Imaging Results:  Relevant Lab Results:   Additional Information SS# [id number]  Barrie Dunker, RN

## 2019-03-10 NOTE — Progress Notes (Addendum)
PROGRESS NOTE    John Spears  KZS:[id number] DOB: 26-Dec-1950 DOA: 03/07/2019 PCP: Derwood Kaplan, MD     Assessment & Plan:   Active Problems:   Lower extremity weakness   Transaminitis   Traumatic rhabdomyolysis: secondary to a fall at home. Continue on IVFs. CK level is trending down today.  HTN: uncontrolled. Continue on amlodipine, hydralazine & start metoprolol. IV Hydralazine prn. Use to take po anti-HTN but pt unsure of the medication and/or dosage  Lactic acidosis: with associated metabolic acidosis. Resolved  Leukocytosis: likely reactive. Will continue to monitor   Chronic alcoholism: alcohol cessation counseling. Monitor for signs/symptoms of w/drawal. Will start librium TID. IV Ativan prn  History of CAD: with abnormal cardiac enzymes/abnormal EKG. Most likely elevation due to rhabdo. Echo shows EF 60-65%, grade I diastolic dysfunction & no wall function abnormalities. Continue on tele.  History of rheumatoid arthritis: not currently on any DMARD  History of CVA: Continue aspirin & statin   Hypokalemia: KCl repleated. Will continue to monitor   CKDI or II: GFR >60. Creatinine at baseline.  Transaminitis: secondary  to EtOH use. ALT & AST are both trending down today. Continue to hold statin. Will continue to monitor   Mild elevation in lipase: secondary to EtOH abuse. Currently no imaging or clinical signs of acute pancreatitis  Normocytic Anemia: likely related to EtOH use and marrow suppression/nutritional deficiency. No need for a transfusion at this time   Thrombocytopenia:  likely related to EtOH use and marrow suppression. Will continue to monitor   Generalized weakness: PT is recommending SNF. Pt is agreeable to SNF   DVT prophylaxis: lovenox Code Status: full  Family Communication:  Disposition Plan:   Consultants:      Procedures:   Antimicrobials: n/a   Subjective: Pt c/o malaise   Objective: Vitals:   03/10/19 0741  03/10/19 1002 03/10/19 1015 03/10/19 1159  BP: (!) 162/75     Pulse: 96 (!) 153 (!) 113   Resp:    18  Temp: 99.5 F (37.5 C)     TempSrc: Oral     SpO2: 97%     Weight:      Height:        Intake/Output Summary (Last 24 hours) at 03/10/2019 1229 Last data filed at 03/10/2019 0641 Gross per 24 hour  Intake 3301.47 ml  Output 650 ml  Net 2651.47 ml   Filed Weights   03/07/19 1853 03/10/19 0500  Weight: 81.6 kg 81.8 kg    Examination:  General exam: Appears calm and comfortable  Respiratory system: diminished breath sounds b/l. No wheezes Cardiovascular system: S1 & S2+. No  rubs, gallops or clicks.  Gastrointestinal system: Abdomen is nondistended, soft and nontender.  Normal bowel sounds heard. Central nervous system: Alert and oriented. Moves all 4 extremities. Decreased ROM of b/l LE in all directions Psychiatry: Judgement and insight appear normal. Flat mood and affect. Tearful today     Data Reviewed: I have personally reviewed following labs and imaging studies  CBC: Recent Labs  Lab 03/07/19 1856 03/08/19 0130 03/08/19 1234 03/09/19 0522 03/10/19 0726  WBC 18.4* 15.2* 13.9* 12.1* 10.7*  NEUTROABS 17.0* 13.3* 11.9*  --   --   HGB 12.1* 10.9* 10.9* 11.2* 11.1*  HCT 36.9* 32.4* 32.7* 33.1* 32.2*  MCV 95.6 94.2 94.8 93.5 91.7  PLT 151 129* 122* 116* 110*   Basic Metabolic Panel: Recent Labs  Lab 03/07/19 1856 03/08/19 1234 03/09/19 0522 03/10/19 0726  NA 140 140  141 136  K 3.0* 2.8* 3.7 2.6*  CL 100 104 100 99  CO2 18* 22 25 22   GLUCOSE 115* 79 82 75  BUN 17 12 10 10   CREATININE 1.59* 1.04 1.01 0.85  CALCIUM 8.6* 7.6* 7.9* 7.7*   GFR: Estimated Creatinine Clearance: 91 mL/min (by C-G formula based on SCr of 0.85 mg/dL). Liver Function Tests: Recent Labs  Lab 03/07/19 1856 03/08/19 1234 03/09/19 0522 03/10/19 0726  AST 269* 185* 242* 180*  ALT 53* 41 56* 46*  ALKPHOS 77 57 60 65  BILITOT 1.2 1.5* 1.5* 1.5*  PROT 7.0 6.3* 6.6 6.4*    ALBUMIN 3.8 3.2* 3.4* 3.1*   Recent Labs  Lab 03/07/19 2007  LIPASE 60*   Recent Labs  Lab 03/07/19 2007  AMMONIA 15   Coagulation Profile: Recent Labs  Lab 03/08/19 0130  INR 1.1   Cardiac Enzymes: Recent Labs  Lab 03/07/19 1856 03/08/19 1234 03/09/19 1010 03/10/19 0726  CKTOTAL 2,779* 6,956* 8,046* 5,370*   BNP (last 3 results) No results for input(s): PROBNP in the last 8760 hours. HbA1C: Recent Labs    03/08/19 1234  HGBA1C 5.0   CBG: No results for input(s): GLUCAP in the last 168 hours. Lipid Profile: Recent Labs    03/08/19 0130  CHOL 254*  HDL 111  LDLCALC 133*  TRIG 51  CHOLHDL 2.3   Thyroid Function Tests: Recent Labs    03/08/19 0130  TSH 0.864   Anemia Panel: Recent Labs    03/08/19 0130  VITAMINB12 201  FOLATE 12.6  FERRITIN 862*  TIBC 223*  IRON 36*  RETICCTPCT 1.2   Sepsis Labs: Recent Labs  Lab 03/07/19 2007 03/07/19 2213 03/08/19 0130 03/08/19 1234 03/09/19 0522  PROCALCITON  --   --  6.39 6.07 4.13  LATICACIDVEN 5.8* 4.6*  --  1.4  --     Recent Results (from the past 240 hour(s))  SARS CORONAVIRUS 2 (TAT 6-24 HRS) Nasopharyngeal     Status: None   Collection Time: 03/07/19  8:07 PM   Specimen: Nasopharyngeal  Result Value Ref Range Status   SARS Coronavirus 2 NEGATIVE NEGATIVE Final    Comment: (NOTE) SARS-CoV-2 target nucleic acids are NOT DETECTED. The SARS-CoV-2 RNA is generally detectable in upper and lower respiratory specimens during the acute phase of infection. Negative results do not preclude SARS-CoV-2 infection, do not rule out co-infections with other pathogens, and should not be used as the sole basis for treatment or other patient management decisions. Negative results must be combined with clinical observations, patient history, and epidemiological information. The expected result is Negative. Fact Sheet for Patients: SugarRoll.be Fact Sheet for Healthcare  Providers: https://www.woods-mathews.com/ This test is not yet approved or cleared by the [place] FDA and  has been authorized for detection and/or diagnosis of SARS-CoV-2 by FDA under an Emergency Use Authorization (EUA). This EUA will remain  in effect (meaning this test can be used) for the duration of the COVID-19 declaration under Section 56 4(b)(1) of the Act, 21 U.S.C. section 360bbb-3(b)(1), unless the authorization is terminated or revoked sooner. Performed at [facility], [place] [address], [place], [place] [address]   Urine culture     Status: None   Collection Time: 03/07/19  8:07 PM   Specimen: Urine, Random  Result Value Ref Range Status   Specimen Description   Final    URINE, RANDOM Performed at [facility], [address] [place], [place] [address]  Special Requests   Final    NONE Performed at [facility], [address], [place], [place] [address]    Culture   Final    NO GROWTH Performed at [facility], [address], [place], [place] [address]    Report Status 03/08/2019 FINAL  Final  Culture, blood (routine x 2)     Status: None (Preliminary result)   Collection Time: 03/07/19 10:13 PM   Specimen: BLOOD  Result Value Ref Range Status   Specimen Description BLOOD RIGHT ANTECUBITAL  Final   Special Requests   Final    BOTTLES DRAWN AEROBIC AND ANAEROBIC Blood Culture results may not be optimal due to an excessive volume of blood received in culture bottles   Culture   Final    NO GROWTH 3 DAYS Performed at [facility], [address], [place], [place] [address]    Report Status PENDING  Incomplete  Culture, blood (routine x 2)     Status: None (Preliminary result)   Collection Time: 03/07/19 10:13 PM   Specimen: BLOOD  Result Value Ref Range Status   Specimen Description BLOOD RIGHT ANTECUBITAL  Final   Special Requests   Final    BOTTLES DRAWN AEROBIC AND ANAEROBIC Blood Culture  results may not be optimal due to an excessive volume of blood received in culture bottles   Culture   Final    NO GROWTH 3 DAYS Performed at [facility], [address] [place], [place] [address]    Report Status PENDING  Incomplete         Radiology Studies: No results found.      Scheduled Meds: . amLODipine  10 mg Oral Daily  . aspirin EC  81 mg Oral Daily  . enoxaparin (LOVENOX) injection  40 mg Subcutaneous Q24H  . hydrALAZINE  50 mg Oral Q8H  . metoprolol succinate  25 mg Oral Daily  . potassium chloride  40 mEq Oral BID  . sodium chloride flush  3 mL Intravenous Q12H   Continuous Infusions: . sodium chloride 100 mL/hr at 03/10/19 0917     LOS: 3 days    Time spent: 32 mins    Charise Killian, MD [facility] Pager [phone]  If 7PM-7AM, please contact night-coverage [email or web address] Password [facility] 03/10/2019, 12:29 PM

## 2019-03-10 NOTE — TOC Progression Note (Signed)
Transition of Care [facility]) - Progression Note    Patient Details  Name: John Spears MRN: [id number] Date of Birth: 1950/04/13  Transition of Care [facility]) CM/SW [place], RN Phone Number: 03/10/2019, 3:10 PM  Clinical Narrative:    Met with the patient and reviewed the bed offers, he chose [facility] in [place], I notified Tina at [facility] and called his sister Inez Catalina at the patients request to notify her   Expected Discharge Plan: [place] Barriers to Discharge: Continued Medical Work up  Expected Discharge Plan and Services Expected Discharge Plan: [place] Choice: [place] arrangements for the past 2 months: Apartment                                       Social Determinants of Health (SDOH) Interventions    Readmission Risk Interventions No flowsheet data found.

## 2019-03-10 NOTE — Progress Notes (Addendum)
Physical Therapy Treatment Patient Details Name: John Spears MRN: [id number] DOB: 12-Aug-1950 Today's Date: 03/10/2019    History of Present Illness Doyt Castellana is a 00PQZ who comes to [facility] on 1/12 c BLE pain. 4 days ago he had a mechanical fall where he fell and injured his hip and knee.  PMH: Alcohol abuse heart disease hyperlipidemia hypertension rheumatoid arthritis prior history of stroke. Pt admitted with Traumatic rhabdomyolysis.    PT Comments    Pt in bed upon entry finishing breakfast, agreeable to participate. Pt reports slight improvement in Rt knee pain from 10 to 8 since yesterday, his decrease in apprehension with mobility corresponds well to this. Pt still somewhat shaky in [place] and trunk, when asked by Pryor Curia, pt denies as baseline phenomenon but casually guesses that it may have something to do with his 'drinking.' Pt does endorse enbibing 'a fair amount' of alcohol PTA citing trips to [facility] store. HR is tachy PTA per tele 110s, then 120s at EOB, and then after SPT to recliner, tele shows rates in 140s-150s with poor resolution over the course of 2 minutes- RN/MD aware. Session terminated due to poor HR control. Pt educated on importance of being OOB at this time, he opts to leave feet down, educated on elevating chair function. Pt made comfortable, all needs met, chair alarm armed. Knee pain less limiting this date than previous, however pt still relatively unable to tolerate RLE loading or terminal knee extension. Rt knee tolerating flexion at least to ~90 degrees for sitting in chair. Pt requiring maxA with RW to make it to chair.    Follow Up Recommendations  SNF;Supervision for mobility/OOB;Supervision - Intermittent     Equipment Recommendations  Rolling walker with 5" wheels    Recommendations for Other Services       Precautions / Restrictions Precautions Precautions: Fall Restrictions Weight Bearing Restrictions: No    Mobility  Bed Mobility Overal bed  mobility: Needs Assistance       Supine to sit: Supervision     General bed mobility comments: moving somewhat better than 1DA, but requires self-assist of RLE for pain control  Transfers Overall transfer level: Needs assistance Equipment used: Rolling walker (2 wheeled) Transfers: Sit to/from [facility] Sit to Stand: Max assist Stand pivot transfers: Mod assist(extensive safety cues and trunk support required. Pt follows cues well.)       General transfer comment: heavy effort/several seconds to come fully up to standing, but once up is much more stable.  Ambulation/Gait Ambulation/Gait assistance: (Deferred as post transfer HR is in 140-150s range with poor recovery.)               Stairs             Wheelchair Mobility    Modified Rankin (Stroke Patients Only)       Balance Overall balance assessment: Needs assistance Sitting-balance support: No upper extremity supported;Feet supported Sitting balance-Leahy Scale: Good Sitting balance - Comments: Pt able to briefly come to long sitting in bed, but is unable to maintain this position during UB MMT.   Standing balance support: Bilateral upper extremity supported;During functional activity Standing balance-Leahy Scale: Poor                              Cognition Arousal/Alertness: Awake/alert Behavior During Therapy: Anxious;WFL for tasks assessed/performed;Impulsive Overall Cognitive Status: No family/caregiver present to determine baseline cognitive functioning  Exercises      General Comments        Pertinent Vitals/Pain Pain Assessment: 0-10 Pain Score: 8  Pain Location: Right knee Pain Descriptors / Indicators: Aching;Burning;Sore Pain Intervention(s): Limited activity within patient's tolerance;Monitored during session    Home Living                      Prior Function            PT Goals  (current goals can now be found in the care plan section) Acute Rehab PT Goals Patient Stated Goal: To have less pain and be more independent PT Goal Formulation: With patient Time For Goal Achievement: 03/23/19 Potential to Achieve Goals: Good Progress towards PT goals: PT to reassess next treatment    Frequency    Min 2X/week      PT Plan Current plan remains appropriate    Co-evaluation              AM-PAC PT "6 Clicks" Mobility   Outcome Measure  Help needed turning from your back to your side while in a flat bed without using bedrails?: A Little Help needed moving from lying on your back to sitting on the side of a flat bed without using bedrails?: A Little Help needed moving to and from a bed to a chair (including a wheelchair)?: A Lot Help needed standing up from a chair using your arms (e.g., wheelchair or bedside chair)?: A Little Help needed to walk in hospital room?: Total Help needed climbing 3-5 steps with a railing? : Total 6 Click Score: 13    End of Session Equipment Utilized During Treatment: Gait belt Activity Tolerance: Treatment limited secondary to medical complications (Comment) Patient left: in bed;with call bell/phone within reach;with bed alarm set;with family/visitor present Nurse Communication: Mobility status PT Visit Diagnosis: Unsteadiness on feet (R26.81);Difficulty in walking, not elsewhere classified (R26.2);Other abnormalities of gait and mobility (R26.89);Muscle weakness (generalized) (M62.81)     Time: 5927-6394 PT Time Calculation (min) (ACUTE ONLY): 20 min  Charges:  $Therapeutic Exercise: 8-22 mins                     10:18 AM, 03/10/19 Etta Grandchild, PT, DPT Physical Therapist - [facility]  [phone] ([place])   Lavanna Rog C 03/10/2019, 10:10 AM

## 2019-03-11 LAB — COMPREHENSIVE METABOLIC PANEL
ALT: 33 U/L (ref 0–44)
AST: 111 U/L — ABNORMAL HIGH (ref 15–41)
Albumin: 2.7 g/dL — ABNORMAL LOW (ref 3.5–5.0)
Alkaline Phosphatase: 57 U/L (ref 38–126)
Anion gap: 15 (ref 5–15)
BUN: 13 mg/dL (ref 8–23)
CO2: 20 mmol/L — ABNORMAL LOW (ref 22–32)
Calcium: 7.6 mg/dL — ABNORMAL LOW (ref 8.9–10.3)
Chloride: 103 mmol/L (ref 98–111)
Creatinine, Ser: 0.89 mg/dL (ref 0.61–1.24)
GFR calc Af Amer: >60 mL/min (ref >60)
GFR calc non Af Amer: >60 mL/min (ref >60)
Glucose, Bld: 74 mg/dL (ref 70–99)
Potassium: 2.7 mmol/L — CL (ref 3.5–5.1)
Sodium: 138 mmol/L (ref 135–145)
Total Bilirubin: 1.4 mg/dL — ABNORMAL HIGH (ref 0.3–1.2)
Total Protein: 6 g/dL — ABNORMAL LOW (ref 6.5–8.1)

## 2019-03-11 LAB — CBC
HCT: 30.3 % — ABNORMAL LOW (ref 39.0–52.0)
Hemoglobin: 10.3 g/dL — ABNORMAL LOW (ref 13.0–17.0)
MCH: 31.5 pg (ref 26.0–34.0)
MCHC: 34 g/dL (ref 30.0–36.0)
MCV: 92.7 fL (ref 80.0–100.0)
Platelets: 110 10*3/uL — ABNORMAL LOW (ref 150–400)
RBC: 3.27 MIL/uL — ABNORMAL LOW (ref 4.22–5.81)
RDW: 13 % (ref 11.5–15.5)
WBC: 8.5 10*3/uL (ref 4.0–10.5)
nRBC: 0 % (ref 0.0–0.2)

## 2019-03-11 LAB — CK: Total CK: 3099 U/L — ABNORMAL HIGH (ref 49–397)

## 2019-03-11 MED ORDER — POTASSIUM CHLORIDE CRYS ER 20 MEQ PO TBCR
40.0000 meq | EXTENDED_RELEASE_TABLET | Freq: Two times a day (BID) | ORAL | Status: AC
  Administered 2019-03-11: 40 meq via ORAL
  Filled 2019-03-11: qty 2

## 2019-03-11 NOTE — Progress Notes (Signed)
Patients BP 171/95 Given PRN IV hydralazine , Tolerated well ,patient noted to have twitching of lips and raised goosebumps to BUE , Complained of pins/ needle sensation to BLE, given PRN ativan 1000 BP reassessed and is 146/81

## 2019-03-11 NOTE — Progress Notes (Signed)
OT Cancellation Note  Patient Details Name: John Spears MRN: [id number] DOB: 1950/10/19   Cancelled Treatment:     Attempted to see patient later in the day and he reports he is fatigued and does not want to get back up out of bed.  Will continue attempts.  Braydon Kullman T Arne Cleveland, OTR/L, CLT   Maximo Spratling 03/11/2019, 4:29 PM

## 2019-03-11 NOTE — Progress Notes (Signed)
PROGRESS NOTE    John Spears  ZOX:[id number] DOB: [birth year] DOA: 03/07/2019 PCP: Derwood Kaplan, MD     Assessment & Plan:   Active Problems:   Lower extremity weakness   Transaminitis   Traumatic rhabdomyolysis: secondary to a fall at home. Continue on IVFs. CK level is trending down again today   HTN: uncontrolled. Continue on amlodipine, hydralazine & metoprolol. IV Hydralazine prn. Use to take po anti-HTN but pt unsure of the medication and/or dosage  Lactic acidosis: with associated metabolic acidosis. Resolved  Leukocytosis: resolved  Chronic alcoholism: alcohol cessation counseling. Monitor for signs/symptoms of w/drawal. Will continue librium TID as pt had hand tremors, but improved today. IV Ativan prn  History of CAD: with abnormal cardiac enzymes/abnormal EKG. Most likely elevation due to rhabdo. Echo shows EF 60-65%, grade I diastolic dysfunction & no wall function abnormalities. Continue on tele.  History of rheumatoid arthritis: not currently on any DMARD  History of CVA: Continue aspirin & statin   Hypokalemia: KCl repleated again. Will continue to monitor   CKDI or II: GFR >60. Creatinine at baseline.  Transaminitis: secondary  to EtOH use. ALT is WNL and AST is elevated still but trending down. Continue to hold statin. Will continue to monitor   Mild elevation in lipase: secondary to EtOH abuse. Currently no imaging or clinical signs of acute pancreatitis  Normocytic Anemia: likely related to EtOH use and marrow suppression/nutritional deficiency. No need for a transfusion at this time   Thrombocytopenia:  likely related to EtOH use and marrow suppression. Will continue to monitor   Generalized weakness: PT is recommending SNF. Pt is agreeable to SNF   DVT prophylaxis: lovenox Code Status: full  Family Communication:  Disposition Plan:   Consultants:      Procedures:   Antimicrobials: n/a   Subjective: Pt c/o  fatigue  Objective: Vitals:   03/11/19 0500 03/11/19 0534 03/11/19 0948 03/11/19 1000  BP:  (!) 153/84 (!) 171/95 (!) 146/81  Pulse:  85 89 88  Resp:      Temp:   99.1 F (37.3 C)   TempSrc:   Oral   SpO2:   100%   Weight: 82.1 kg     Height:        Intake/Output Summary (Last 24 hours) at 03/11/2019 1248 Last data filed at 03/11/2019 0942 Gross per 24 hour  Intake 10 ml  Output 650 ml  Net -640 ml   Filed Weights   03/07/19 1853 03/10/19 0500 03/11/19 0500  Weight: 81.6 kg 81.8 kg 82.1 kg    Examination:  General exam: Appears calm and comfortable  Respiratory system: decreased breath sounds b/l. No wheezes Cardiovascular system: S1 & S2+. No  rubs, gallops or clicks.  Gastrointestinal system: Abdomen is nondistended, soft and nontender.  Normal bowel sounds heard. Central nervous system: Alert and oriented. Moves all 4 extremities. Decreased ROM of b/l LE in all directions Psychiatry: Judgement and insight appear normal. Flat mood and affect.     Data Reviewed: I have personally reviewed following labs and imaging studies  CBC: Recent Labs  Lab 03/07/19 1856 03/07/19 1856 03/08/19 0130 03/08/19 1234 03/09/19 0522 03/10/19 0726 03/11/19 0636  WBC 18.4*   < > 15.2* 13.9* 12.1* 10.7* 8.5  NEUTROABS 17.0*  --  13.3* 11.9*  --   --   --   HGB 12.1*   < > 10.9* 10.9* 11.2* 11.1* 10.3*  HCT 36.9*   < > 32.4* 32.7* 33.1* 32.2* 30.3*  MCV 95.6   < > 94.2 94.8 93.5 91.7 92.7  PLT 151   < > 129* 122* 116* 110* 110*   < > = values in this interval not displayed.   Basic Metabolic Panel: Recent Labs  Lab 03/07/19 1856 03/08/19 1234 03/09/19 0522 03/10/19 0726 03/11/19 0636  NA 140 140 141 136 138  K 3.0* 2.8* 3.7 2.6* 2.7*  CL 100 104 100 99 103  CO2 18* 22 25 22  20*  GLUCOSE 115* 79 82 75 74  BUN 17 12 10 10 13   CREATININE 1.59* 1.04 1.01 0.85 0.89  CALCIUM 8.6* 7.6* 7.9* 7.7* 7.6*   GFR: Estimated Creatinine Clearance: 87 mL/min (by C-G formula based  on SCr of 0.89 mg/dL). Liver Function Tests: Recent Labs  Lab 03/07/19 1856 03/08/19 1234 03/09/19 0522 03/10/19 0726 03/11/19 0636  AST 269* 185* 242* 180* 111*  ALT 53* 41 56* 46* 33  ALKPHOS 77 57 60 65 57  BILITOT 1.2 1.5* 1.5* 1.5* 1.4*  PROT 7.0 6.3* 6.6 6.4* 6.0*  ALBUMIN 3.8 3.2* 3.4* 3.1* 2.7*   Recent Labs  Lab 03/07/19 2007  LIPASE 60*   Recent Labs  Lab 03/07/19 2007  AMMONIA 15   Coagulation Profile: Recent Labs  Lab 03/08/19 0130  INR 1.1   Cardiac Enzymes: Recent Labs  Lab 03/07/19 1856 03/08/19 1234 03/09/19 1010 03/10/19 0726 03/11/19 0636  CKTOTAL 2,779* 6,956* 8,046* 5,370* 3,099*   BNP (last 3 results) No results for input(s): PROBNP in the last 8760 hours. HbA1C: No results for input(s): HGBA1C in the last 72 hours. CBG: No results for input(s): GLUCAP in the last 168 hours. Lipid Profile: No results for input(s): CHOL, HDL, LDLCALC, TRIG, CHOLHDL, LDLDIRECT in the last 72 hours. Thyroid Function Tests: No results for input(s): TSH, T4TOTAL, FREET4, T3FREE, THYROIDAB in the last 72 hours. Anemia Panel: No results for input(s): VITAMINB12, FOLATE, FERRITIN, TIBC, IRON, RETICCTPCT in the last 72 hours. Sepsis Labs: Recent Labs  Lab 03/07/19 2007 03/07/19 2213 03/08/19 0130 03/08/19 1234 03/09/19 0522  PROCALCITON  --   --  6.39 6.07 4.13  LATICACIDVEN 5.8* 4.6*  --  1.4  --     Recent Results (from the past 240 hour(s))  SARS CORONAVIRUS 2 (TAT 6-24 HRS) Nasopharyngeal     Status: None   Collection Time: 03/07/19  8:07 PM   Specimen: Nasopharyngeal  Result Value Ref Range Status   SARS Coronavirus 2 NEGATIVE NEGATIVE Final    Comment: (NOTE) SARS-CoV-2 target nucleic acids are NOT DETECTED. The SARS-CoV-2 RNA is generally detectable in upper and lower respiratory specimens during the acute phase of infection. Negative results do not preclude SARS-CoV-2 infection, do not rule out co-infections with other pathogens, and  should not be used as the sole basis for treatment or other patient management decisions. Negative results must be combined with clinical observations, patient history, and epidemiological information. The expected result is Negative. Fact Sheet for Patients: 03/11/19 Fact Sheet for Healthcare Providers: 05/05/19 This test is not yet approved or cleared by the [place] FDA and  has been authorized for detection and/or diagnosis of SARS-CoV-2 by FDA under an Emergency Use Authorization (EUA). This EUA will remain  in effect (meaning this test can be used) for the duration of the COVID-19 declaration under Section 56 4(b)(1) of the Act, 21 U.S.C. section 360bbb-3(b)(1), unless the authorization is terminated or revoked sooner. Performed at [facility], [address] [place], [address] [place]  Urine culture     Status: None   Collection Time: 03/07/19  8:07 PM   Specimen: Urine, Random  Result Value Ref Range Status   Specimen Description   Final    URINE, RANDOM Performed at [facility], [address], [place], [place] [address]    Special Requests   Final    NONE Performed at [facility], [address], [place], [place] [address]    Culture   Final    NO GROWTH Performed at [facility], [place] [address] [place], [place] [address]    Report Status 03/08/2019 FINAL  Final  Culture, blood (routine x 2)     Status: None (Preliminary result)   Collection Time: 03/07/19 10:13 PM   Specimen: BLOOD  Result Value Ref Range Status   Specimen Description BLOOD RIGHT ANTECUBITAL  Final   Special Requests   Final    BOTTLES DRAWN AEROBIC AND ANAEROBIC Blood Culture results may not be optimal due to an excessive volume of blood received in culture bottles   Culture   Final    NO GROWTH 4 DAYS Performed at [facility], [address] [place], [place] [address]     Report Status PENDING  Incomplete  Culture, blood (routine x 2)     Status: None (Preliminary result)   Collection Time: 03/07/19 10:13 PM   Specimen: BLOOD  Result Value Ref Range Status   Specimen Description BLOOD RIGHT ANTECUBITAL  Final   Special Requests   Final    BOTTLES DRAWN AEROBIC AND ANAEROBIC Blood Culture results may not be optimal due to an excessive volume of blood received in culture bottles   Culture   Final    NO GROWTH 4 DAYS Performed at [facility], [address], [place], [place] [address]    Report Status PENDING  Incomplete         Radiology Studies: No results found.      Scheduled Meds: . amLODipine  10 mg Oral Daily  . aspirin EC  81 mg Oral Daily  . chlordiazePOXIDE  5 mg Oral TID  . enoxaparin (LOVENOX) injection  40 mg Subcutaneous Q24H  . hydrALAZINE  50 mg Oral Q8H  . metoprolol succinate  25 mg Oral Daily  . sodium chloride flush  3 mL Intravenous Q12H   Continuous Infusions: . sodium chloride 100 mL/hr at 03/11/19 0642     LOS: 4 days    Time spent: 30 mins    Wyvonnia Dusky, MD [facility] Pager [phone]  If 7PM-7AM, please contact night-coverage [email or web address] Password [facility] 03/11/2019, 12:48 PM

## 2019-03-12 DIAGNOSIS — E876 Hypokalemia: Secondary | ICD-10-CM

## 2019-03-12 LAB — COMPREHENSIVE METABOLIC PANEL
ALT: 28 U/L (ref 0–44)
AST: 86 U/L — ABNORMAL HIGH (ref 15–41)
Albumin: 2.8 g/dL — ABNORMAL LOW (ref 3.5–5.0)
Alkaline Phosphatase: 60 U/L (ref 38–126)
Anion gap: 10 (ref 5–15)
BUN: 10 mg/dL (ref 8–23)
CO2: 24 mmol/L (ref 22–32)
Calcium: 7.7 mg/dL — ABNORMAL LOW (ref 8.9–10.3)
Chloride: 106 mmol/L (ref 98–111)
Creatinine, Ser: 0.79 mg/dL (ref 0.61–1.24)
GFR calc Af Amer: >60 mL/min (ref >60)
GFR calc non Af Amer: >60 mL/min (ref >60)
Glucose, Bld: 85 mg/dL (ref 70–99)
Potassium: 3 mmol/L — ABNORMAL LOW (ref 3.5–5.1)
Sodium: 140 mmol/L (ref 135–145)
Total Bilirubin: 1.1 mg/dL (ref 0.3–1.2)
Total Protein: 6.1 g/dL — ABNORMAL LOW (ref 6.5–8.1)

## 2019-03-12 LAB — CBC
HCT: 30.5 % — ABNORMAL LOW (ref 39.0–52.0)
Hemoglobin: 10.4 g/dL — ABNORMAL LOW (ref 13.0–17.0)
MCH: 32 pg (ref 26.0–34.0)
MCHC: 34.1 g/dL (ref 30.0–36.0)
MCV: 93.8 fL (ref 80.0–100.0)
Platelets: 130 10*3/uL — ABNORMAL LOW (ref 150–400)
RBC: 3.25 MIL/uL — ABNORMAL LOW (ref 4.22–5.81)
RDW: 13.1 % (ref 11.5–15.5)
WBC: 7.4 10*3/uL (ref 4.0–10.5)
nRBC: 0 % (ref 0.0–0.2)

## 2019-03-12 LAB — CULTURE, BLOOD (ROUTINE X 2)
Culture: NO GROWTH
Culture: NO GROWTH

## 2019-03-12 LAB — CK: Total CK: 1850 U/L — ABNORMAL HIGH (ref 49–397)

## 2019-03-12 MED ORDER — POTASSIUM CHLORIDE CRYS ER 20 MEQ PO TBCR
60.0000 meq | EXTENDED_RELEASE_TABLET | Freq: Once | ORAL | Status: AC
  Administered 2019-03-12: 60 meq via ORAL
  Filled 2019-03-12: qty 3

## 2019-03-12 NOTE — Progress Notes (Signed)
PROGRESS NOTE    John Spears  GXQ:[id number] DOB: [birth year] DOA: 03/07/2019 PCP: Derwood Kaplan, MD     Assessment & Plan:   Active Problems:   Lower extremity weakness   Transaminitis   Traumatic rhabdomyolysis: secondary to a fall at home. Continue on IVFs. CK continues to trend down daily  HTN: uncontrolled. Continue on amlodipine, hydralazine & metoprolol. IV Hydralazine prn. Use to take po anti-HTN but pt unsure of the medication and/or dosage  Lactic acidosis: with associated metabolic acidosis. Resolved  Leukocytosis: resolved  Chronic alcoholism: alcohol cessation counseling. Monitor for signs/symptoms of w/drawal. Will continue librium TID as pt had hand tremors, but improved. IV Ativan prn  History of CAD: with abnormal cardiac enzymes/abnormal EKG. Most likely elevation due to rhabdo. Echo shows EF 60-65%, grade I diastolic dysfunction & no wall function abnormalities. Continue on tele.  History of rheumatoid arthritis: not currently on any DMARD  History of CVA: Continue aspirin & statin   Hypokalemia: KCl repleated again. Will continue to monitor   CKDI or II: GFR >60. Creatinine at baseline.  Transaminitis: secondary  to EtOH use. ALT is WNL and AST is elevated still but trending down again today. Continue to hold statin. Will continue to monitor   Mild elevation in lipase: secondary to EtOH abuse. Currently no imaging or clinical signs of acute pancreatitis  Normocytic Anemia: likely related to EtOH use and marrow suppression/nutritional deficiency. No need for a transfusion at this time   Thrombocytopenia:  likely related to EtOH use and marrow suppression. Will continue to monitor   Generalized weakness: PT is recommending SNF. Pt is agreeable to SNF   DVT prophylaxis: lovenox Code Status: full  Family Communication:  Disposition Plan:  Can likely d/c to SNF tomorrow if CK continues to trend down    Consultants:      Procedures:   Antimicrobials: n/a   Subjective: Pt c/o malaise  Objective: Vitals:   03/11/19 2310 03/12/19 0456 03/12/19 0459 03/12/19 0752  BP: (!) 155/87 (!) 178/86  (!) 157/81  Pulse: 86 89  93  Resp: 16 20    Temp: 98.3 F (36.8 C) 98.1 F (36.7 C)  99.5 F (37.5 C)  TempSrc: Oral Oral  Oral  SpO2: 98% 100%  98%  Weight:   82.6 kg   Height:        Intake/Output Summary (Last 24 hours) at 03/12/2019 1232 Last data filed at 03/12/2019 1151 Gross per 24 hour  Intake 120 ml  Output 975 ml  Net -855 ml   Filed Weights   03/10/19 0500 03/11/19 0500 03/12/19 0459  Weight: 81.8 kg 82.1 kg 82.6 kg    Examination:  General exam: Appears calm and comfortable  Respiratory system: diminished breath sounds b/l. No wheezes Cardiovascular system: S1 & S2+. No  rubs, gallops or clicks.  Gastrointestinal system: Abdomen is nondistended, soft and nontender.  Normal bowel sounds heard. Central nervous system: Alert and oriented. Moves all 4 extremities. Decreased ROM of b/l LE in all directions Psychiatry: Judgement and insight appear normal. Flat mood and affect.     Data Reviewed: I have personally reviewed following labs and imaging studies  CBC: Recent Labs  Lab 03/07/19 1856 03/07/19 1856 03/08/19 0130 03/08/19 0130 03/08/19 1234 03/09/19 0522 03/10/19 0726 03/11/19 0636 03/12/19 0446  WBC 18.4*   < > 15.2*   < > 13.9* 12.1* 10.7* 8.5 7.4  NEUTROABS 17.0*  --  13.3*  --  11.9*  --   --   --   --  HGB 12.1*   < > 10.9*   < > 10.9* 11.2* 11.1* 10.3* 10.4*  HCT 36.9*   < > 32.4*   < > 32.7* 33.1* 32.2* 30.3* 30.5*  MCV 95.6   < > 94.2   < > 94.8 93.5 91.7 92.7 93.8  PLT 151   < > 129*   < > 122* 116* 110* 110* 130*   < > = values in this interval not displayed.   Basic Metabolic Panel: Recent Labs  Lab 03/08/19 1234 03/09/19 0522 03/10/19 0726 03/11/19 0636 03/12/19 0446  NA 140 141 136 138 140  K 2.8* 3.7 2.6* 2.7* 3.0*  CL 104 100 99 103 106  CO2 22 25 22  20* 24   GLUCOSE 79 82 75 74 85  BUN 12 10 10 13 10   CREATININE 1.04 1.01 0.85 0.89 0.79  CALCIUM 7.6* 7.9* 7.7* 7.6* 7.7*   GFR: Estimated Creatinine Clearance: 96.7 mL/min (by C-G formula based on SCr of 0.79 mg/dL). Liver Function Tests: Recent Labs  Lab 03/08/19 1234 03/09/19 0522 03/10/19 0726 03/11/19 0636 03/12/19 0446  AST 185* 242* 180* 111* 86*  ALT 41 56* 46* 33 28  ALKPHOS 57 60 65 57 60  BILITOT 1.5* 1.5* 1.5* 1.4* 1.1  PROT 6.3* 6.6 6.4* 6.0* 6.1*  ALBUMIN 3.2* 3.4* 3.1* 2.7* 2.8*   Recent Labs  Lab 03/07/19 2007  LIPASE 60*   Recent Labs  Lab 03/07/19 2007  AMMONIA 15   Coagulation Profile: Recent Labs  Lab 03/08/19 0130  INR 1.1   Cardiac Enzymes: Recent Labs  Lab 03/08/19 1234 03/09/19 1010 03/10/19 0726 03/11/19 0636 03/12/19 0446  CKTOTAL 6,956* 8,046* 5,370* 3,099* 1,850*   BNP (last 3 results) No results for input(s): PROBNP in the last 8760 hours. HbA1C: No results for input(s): HGBA1C in the last 72 hours. CBG: No results for input(s): GLUCAP in the last 168 hours. Lipid Profile: No results for input(s): CHOL, HDL, LDLCALC, TRIG, CHOLHDL, LDLDIRECT in the last 72 hours. Thyroid Function Tests: No results for input(s): TSH, T4TOTAL, FREET4, T3FREE, THYROIDAB in the last 72 hours. Anemia Panel: No results for input(s): VITAMINB12, FOLATE, FERRITIN, TIBC, IRON, RETICCTPCT in the last 72 hours. Sepsis Labs: Recent Labs  Lab 03/07/19 2007 03/07/19 2213 03/08/19 0130 03/08/19 1234 03/09/19 0522  PROCALCITON  --   --  6.39 6.07 4.13  LATICACIDVEN 5.8* 4.6*  --  1.4  --     Recent Results (from the past 240 hour(s))  SARS CORONAVIRUS 2 (TAT 6-24 HRS) Nasopharyngeal     Status: None   Collection Time: 03/07/19  8:07 PM   Specimen: Nasopharyngeal  Result Value Ref Range Status   SARS Coronavirus 2 NEGATIVE NEGATIVE Final    Comment: (NOTE) SARS-CoV-2 target nucleic acids are NOT DETECTED. The SARS-CoV-2 RNA is generally detectable  in upper and lower respiratory specimens during the acute phase of infection. Negative results do not preclude SARS-CoV-2 infection, do not rule out co-infections with other pathogens, and should not be used as the sole basis for treatment or other patient management decisions. Negative results must be combined with clinical observations, patient history, and epidemiological information. The expected result is Negative. Fact Sheet for Patients: SugarRoll.be Fact Sheet for Healthcare Providers: https://www.woods-mathews.com/ This test is not yet approved or cleared by the [place] FDA and  has been authorized for detection and/or diagnosis of SARS-CoV-2 by FDA under an Emergency Use Authorization (EUA). This EUA will remain  in effect (meaning this test  can be used) for the duration of the COVID-19 declaration under Section 56 4(b)(1) of the Act, 21 U.S.C. section 360bbb-3(b)(1), unless the authorization is terminated or revoked sooner. Performed at [facility], [address] [place], [place] [address]   Urine culture     Status: None   Collection Time: 03/07/19  8:07 PM   Specimen: Urine, Random  Result Value Ref Range Status   Specimen Description   Final    URINE, RANDOM Performed at [facility], [address] [place], [place] [address]    Special Requests   Final    NONE Performed at [facility], [address], [place], [place] [address]    Culture   Final    NO GROWTH Performed at [facility], [address], [place], [place] [address]    Report Status 03/08/2019 FINAL  Final  Culture, blood (routine x 2)     Status: None   Collection Time: 03/07/19 10:13 PM   Specimen: BLOOD  Result Value Ref Range Status   Specimen Description BLOOD RIGHT ANTECUBITAL  Final   Special Requests   Final    BOTTLES DRAWN AEROBIC AND ANAEROBIC Blood Culture results may not be optimal due to an excessive  volume of blood received in culture bottles   Culture   Final    NO GROWTH 5 DAYS Performed at [facility], [address] [place], [place] [address]    Report Status 03/12/2019 FINAL  Final  Culture, blood (routine x 2)     Status: None   Collection Time: 03/07/19 10:13 PM   Specimen: BLOOD  Result Value Ref Range Status   Specimen Description BLOOD RIGHT ANTECUBITAL  Final   Special Requests   Final    BOTTLES DRAWN AEROBIC AND ANAEROBIC Blood Culture results may not be optimal due to an excessive volume of blood received in culture bottles   Culture   Final    NO GROWTH 5 DAYS Performed at [facility], [address] [place], [place] [address]    Report Status 03/12/2019 FINAL  Final         Radiology Studies: No results found.      Scheduled Meds: . amLODipine  10 mg Oral Daily  . aspirin EC  81 mg Oral Daily  . chlordiazePOXIDE  5 mg Oral TID  . enoxaparin (LOVENOX) injection  40 mg Subcutaneous Q24H  . hydrALAZINE  50 mg Oral Q8H  . metoprolol succinate  25 mg Oral Daily  . sodium chloride flush  3 mL Intravenous Q12H   Continuous Infusions: . sodium chloride 75 mL/hr at 03/12/19 1122     LOS: 5 days    Time spent: 30 mins    Charise Killian, MD [facility] Pager [phone]  If 7PM-7AM, please contact night-coverage [email or web address] Password [facility] 03/12/2019, 12:32 PM

## 2019-03-13 LAB — COMPREHENSIVE METABOLIC PANEL
ALT: 26 U/L (ref 0–44)
AST: 65 U/L — ABNORMAL HIGH (ref 15–41)
Albumin: 2.8 g/dL — ABNORMAL LOW (ref 3.5–5.0)
Alkaline Phosphatase: 58 U/L (ref 38–126)
Anion gap: 11 (ref 5–15)
BUN: 7 mg/dL — ABNORMAL LOW (ref 8–23)
CO2: 24 mmol/L (ref 22–32)
Calcium: 7.6 mg/dL — ABNORMAL LOW (ref 8.9–10.3)
Chloride: 105 mmol/L (ref 98–111)
Creatinine, Ser: 0.76 mg/dL (ref 0.61–1.24)
GFR calc Af Amer: >60 mL/min (ref >60)
GFR calc non Af Amer: >60 mL/min (ref >60)
Glucose, Bld: 86 mg/dL (ref 70–99)
Potassium: 2.5 mmol/L — CL (ref 3.5–5.1)
Sodium: 140 mmol/L (ref 135–145)
Total Bilirubin: 1 mg/dL (ref 0.3–1.2)
Total Protein: 5.7 g/dL — ABNORMAL LOW (ref 6.5–8.1)

## 2019-03-13 LAB — CBC
HCT: 29.5 % — ABNORMAL LOW (ref 39.0–52.0)
Hemoglobin: 9.9 g/dL — ABNORMAL LOW (ref 13.0–17.0)
MCH: 31.2 pg (ref 26.0–34.0)
MCHC: 33.6 g/dL (ref 30.0–36.0)
MCV: 93.1 fL (ref 80.0–100.0)
Platelets: 158 10*3/uL (ref 150–400)
RBC: 3.17 MIL/uL — ABNORMAL LOW (ref 4.22–5.81)
RDW: 12.9 % (ref 11.5–15.5)
WBC: 6.3 10*3/uL (ref 4.0–10.5)
nRBC: 0 % (ref 0.0–0.2)

## 2019-03-13 LAB — SARS CORONAVIRUS 2 (TAT 6-24 HRS): SARS Coronavirus 2: NEGATIVE

## 2019-03-13 LAB — MAGNESIUM
Magnesium: 0.7 mg/dL — CL (ref 1.7–2.4)
Magnesium: 1.6 mg/dL — ABNORMAL LOW (ref 1.7–2.4)

## 2019-03-13 LAB — CK: Total CK: 795 U/L — ABNORMAL HIGH (ref 49–397)

## 2019-03-13 MED ORDER — POTASSIUM CHLORIDE CRYS ER 20 MEQ PO TBCR
40.0000 meq | EXTENDED_RELEASE_TABLET | Freq: Two times a day (BID) | ORAL | Status: AC
  Administered 2019-03-13 (×2): 40 meq via ORAL
  Filled 2019-03-13 (×2): qty 2

## 2019-03-13 MED ORDER — MAGNESIUM SULFATE 4 GM/100ML IV SOLN
4.0000 g | Freq: Once | INTRAVENOUS | Status: AC
  Administered 2019-03-13: 4 g via INTRAVENOUS
  Filled 2019-03-13: qty 100

## 2019-03-13 NOTE — Plan of Care (Signed)
  Problem: Education: Goal: Knowledge of General Education information will improve Description: Including pain rating scale, medication(s)/side effects and non-pharmacologic comfort measures Outcome: Progressing   Problem: Clinical Measurements: Goal: Respiratory complications will improve Outcome: Progressing   Problem: Activity: Goal: Risk for activity intolerance will decrease Outcome: Progressing   Problem: Elimination: Goal: Will not experience complications related to urinary retention Outcome: Progressing   Problem: Pain Managment: Goal: General experience of comfort will improve Outcome: Progressing   Problem: Safety: Goal: Ability to remain free from injury will improve Outcome: Progressing   Problem: Skin Integrity: Goal: Risk for impaired skin integrity will decrease Outcome: Progressing

## 2019-03-13 NOTE — Progress Notes (Signed)
Lab called with critical Potassium level of 2.5 , Paged Dr. Mayford Knife , awaiting new orders at this time

## 2019-03-13 NOTE — Progress Notes (Signed)
PT Cancellation Note  Patient Details Name: John Spears MRN: [id number] DOB: [birth year]   Cancelled Treatment:    Reason Eval/Treat Not Completed: Medical issues which prohibited therapy(Chart reviewed, pt c hypokalemia this date. K+2.5 outside of safe range for working with PT. Will resume services at later date/time once appropriate.)  10:11 AM, 03/13/19 Rosamaria Lints, PT, DPT Physical Therapist - [facility]  [phone] (ASCOM)    Zia Najera C 03/13/2019, 10:11 AM

## 2019-03-13 NOTE — Progress Notes (Signed)
Patient pleasant and cooperative this shift. No tremors noted,. Ate well today and tolerated PO fluids without difficulty ,condom cath intact and draining below bladder. Tolerating librium taper well.

## 2019-03-13 NOTE — Plan of Care (Signed)

## 2019-03-13 NOTE — Progress Notes (Signed)
OT Cancellation Note  Patient Details Name: John Spears MRN: [id number] DOB: 08/05/1950   Cancelled Treatment:    Reason Eval/Treat Not Completed: Medical issues which prohibited therapy. Chart reviewed. Pt noted with K+ of 2.5 today. Low K+ falling outside guidelines for safe participation in therapy. Will hold OT tx this date and re-attempt at later date/time as pt is medically appropriate.   Richrd Prime, MPH, MS, OTR/L ascom [phone] 03/13/19, 2:48 PM

## 2019-03-13 NOTE — Progress Notes (Signed)
PROGRESS NOTE    John Spears  XLK:[id number] DOB: 10-04-1950 DOA: 03/07/2019 PCP: Derwood Kaplan, MD     Assessment & Plan:   Active Problems:   Lower extremity weakness   Transaminitis   Hypokalemia   Traumatic rhabdomyolysis: secondary to a fall at home. Continue on IVFs. CK is trending down  HTN: uncontrolled. Continue on amlodipine, hydralazine & metoprolol. IV Hydralazine prn. Use to take po anti-HTN but pt unsure of the medication and/or dosage  Lactic acidosis: with associated metabolic acidosis. Resolved  Leukocytosis: resolved  Chronic alcoholism: alcohol cessation counseling. Monitor for signs/symptoms of w/drawal. Will continue librium TID as pt had hand tremors, but improved. IV Ativan prn  History of CAD: with abnormal cardiac enzymes/abnormal EKG. Most likely elevation due to rhabdo. Echo shows EF 60-65%, grade I diastolic dysfunction & no wall function abnormalities. Continue on tele.  History of rheumatoid arthritis: not currently on any DMARD  History of CVA: Continue aspirin & statin   Hypokalemia: KCl repleated again. Will continue to monitor   Hypomagnesemia: mg sulfate given. Repeat Mg level ordered for this evening. Will continue to monitor   CKDI or II: GFR >60. Creatinine at baseline.  Transaminitis: secondary  to EtOH use. ALT is WNL and AST is elevated still but continues to trend down. Continue to hold statin. Will continue to monitor   Mild elevation in lipase: secondary to EtOH abuse. Currently no imaging or clinical signs of acute pancreatitis  Normocytic Anemia: likely related to EtOH use and marrow suppression/nutritional deficiency. No need for a transfusion at this time   Thrombocytopenia:  likely related to EtOH use and marrow suppression. Resolved  Generalized weakness: PT is recommending SNF. Pt is agreeable to SNF   DVT prophylaxis: lovenox Code Status: full  Family Communication:  Disposition Plan:  Repeat COVID19  test ordered for pt to d/c to SNF; Will likely go to SNF tomorrow    Consultants:      Procedures:   Antimicrobials: n/a   Subjective: Pt c/o fatigue   Objective: Vitals:   03/12/19 2300 03/12/19 2345 03/13/19 0639 03/13/19 0832  BP: (!) 164/91 (!) 158/83 (!) 154/90 (!) 167/82  Pulse:  92 90 83  Resp:    18  Temp:  99.4 F (37.4 C)  98.3 F (36.8 C)  TempSrc:  Oral  Oral  SpO2:  99%  97%  Weight:      Height:        Intake/Output Summary (Last 24 hours) at 03/13/2019 1135 Last data filed at 03/12/2019 2346 Gross per 24 hour  Intake 243 ml  Output 500 ml  Net -257 ml   Filed Weights   03/10/19 0500 03/11/19 0500 03/12/19 0459  Weight: 81.8 kg 82.1 kg 82.6 kg    Examination:  General exam: Appears calm and comfortable  Respiratory system: decreased breath sounds b/l. No wheezes Cardiovascular system: S1 & S2+. No  rubs, gallops or clicks.  Gastrointestinal system: Abdomen is nondistended, soft and nontender.  Normal bowel sounds heard. Central nervous system: Alert and oriented. Moves all 4 extremities. Decreased ROM of b/l LE in all directions Psychiatry: Judgement and insight appear normal. Flat mood and affect.     Data Reviewed: I have personally reviewed following labs and imaging studies  CBC: Recent Labs  Lab 03/07/19 1856 03/07/19 1856 03/08/19 0130 03/08/19 0130 03/08/19 1234 03/08/19 1234 03/09/19 0522 03/10/19 0726 03/11/19 0636 03/12/19 0446 03/13/19 0609  WBC 18.4*   < > 15.2*   < >  13.9*   < > 12.1* 10.7* 8.5 7.4 6.3  NEUTROABS 17.0*  --  13.3*  --  11.9*  --   --   --   --   --   --   HGB 12.1*   < > 10.9*   < > 10.9*   < > 11.2* 11.1* 10.3* 10.4* 9.9*  HCT 36.9*   < > 32.4*   < > 32.7*   < > 33.1* 32.2* 30.3* 30.5* 29.5*  MCV 95.6   < > 94.2   < > 94.8   < > 93.5 91.7 92.7 93.8 93.1  PLT 151   < > 129*   < > 122*   < > 116* 110* 110* 130* 158   < > = values in this interval not displayed.   Basic Metabolic Panel: Recent Labs    Lab 03/09/19 0522 03/10/19 0726 03/11/19 0636 03/12/19 0446 03/13/19 0609  NA 141 136 138 140 140  K 3.7 2.6* 2.7* 3.0* 2.5*  CL 100 99 103 106 105  CO2 25 22 20* 24 24  GLUCOSE 82 75 74 85 86  BUN 10 10 13 10  7*  CREATININE 1.01 0.85 0.89 0.79 0.76  CALCIUM 7.9* 7.7* 7.6* 7.7* 7.6*  MG  --   --   --   --  0.7*   GFR: Estimated Creatinine Clearance: 96.7 mL/min (by C-G formula based on SCr of 0.76 mg/dL). Liver Function Tests: Recent Labs  Lab 03/09/19 0522 03/10/19 0726 03/11/19 0636 03/12/19 0446 03/13/19 0609  AST 242* 180* 111* 86* 65*  ALT 56* 46* 33 28 26  ALKPHOS 60 65 57 60 58  BILITOT 1.5* 1.5* 1.4* 1.1 1.0  PROT 6.6 6.4* 6.0* 6.1* 5.7*  ALBUMIN 3.4* 3.1* 2.7* 2.8* 2.8*   Recent Labs  Lab 03/07/19 2007  LIPASE 60*   Recent Labs  Lab 03/07/19 2007  AMMONIA 15   Coagulation Profile: Recent Labs  Lab 03/08/19 0130  INR 1.1   Cardiac Enzymes: Recent Labs  Lab 03/09/19 1010 03/10/19 0726 03/11/19 0636 03/12/19 0446 03/13/19 0609  CKTOTAL 8,046* 5,370* 3,099* 1,850* 795*   BNP (last 3 results) No results for input(s): PROBNP in the last 8760 hours. HbA1C: No results for input(s): HGBA1C in the last 72 hours. CBG: No results for input(s): GLUCAP in the last 168 hours. Lipid Profile: No results for input(s): CHOL, HDL, LDLCALC, TRIG, CHOLHDL, LDLDIRECT in the last 72 hours. Thyroid Function Tests: No results for input(s): TSH, T4TOTAL, FREET4, T3FREE, THYROIDAB in the last 72 hours. Anemia Panel: No results for input(s): VITAMINB12, FOLATE, FERRITIN, TIBC, IRON, RETICCTPCT in the last 72 hours. Sepsis Labs: Recent Labs  Lab 03/07/19 2007 03/07/19 2213 03/08/19 0130 03/08/19 1234 03/09/19 0522  PROCALCITON  --   --  6.39 6.07 4.13  LATICACIDVEN 5.8* 4.6*  --  1.4  --     Recent Results (from the past 240 hour(s))  SARS CORONAVIRUS 2 (TAT 6-24 HRS) Nasopharyngeal     Status: None   Collection Time: 03/07/19  8:07 PM   Specimen:  Nasopharyngeal  Result Value Ref Range Status   SARS Coronavirus 2 NEGATIVE NEGATIVE Final    Comment: (NOTE) SARS-CoV-2 target nucleic acids are NOT DETECTED. The SARS-CoV-2 RNA is generally detectable in upper and lower respiratory specimens during the acute phase of infection. Negative results do not preclude SARS-CoV-2 infection, do not rule out co-infections with other pathogens, and should not be used as the sole basis for treatment  or other patient management decisions. Negative results must be combined with clinical observations, patient history, and epidemiological information. The expected result is Negative. Fact Sheet for Patients: HairSlick.no Fact Sheet for Healthcare Providers: quierodirigir.com This test is not yet approved or cleared by the [place] FDA and  has been authorized for detection and/or diagnosis of SARS-CoV-2 by FDA under an Emergency Use Authorization (EUA). This EUA will remain  in effect (meaning this test can be used) for the duration of the COVID-19 declaration under Section 56 4(b)(1) of the Act, 21 U.S.C. section 360bbb-3(b)(1), unless the authorization is terminated or revoked sooner. Performed at [facility], [address], [place], [place] [address]   Urine culture     Status: None   Collection Time: 03/07/19  8:07 PM   Specimen: Urine, Random  Result Value Ref Range Status   Specimen Description   Final    URINE, RANDOM Performed at [facility], [address], [place], [place] [address]    Special Requests   Final    NONE Performed at [facility], [address] [place], [place] [address]    Culture   Final    NO GROWTH Performed at [facility], [address] [place], [place] [address]    Report Status 03/08/2019 FINAL  Final  Culture, blood (routine x 2)     Status: None   Collection Time: 03/07/19 10:13 PM   Specimen: BLOOD    Result Value Ref Range Status   Specimen Description BLOOD RIGHT ANTECUBITAL  Final   Special Requests   Final    BOTTLES DRAWN AEROBIC AND ANAEROBIC Blood Culture results may not be optimal due to an excessive volume of blood received in culture bottles   Culture   Final    NO GROWTH 5 DAYS Performed at [facility], [address] [place], [place] [address]    Report Status 03/12/2019 FINAL  Final  Culture, blood (routine x 2)     Status: None   Collection Time: 03/07/19 10:13 PM   Specimen: BLOOD  Result Value Ref Range Status   Specimen Description BLOOD RIGHT ANTECUBITAL  Final   Special Requests   Final    BOTTLES DRAWN AEROBIC AND ANAEROBIC Blood Culture results may not be optimal due to an excessive volume of blood received in culture bottles   Culture   Final    NO GROWTH 5 DAYS Performed at [facility], [address], [place], [place] [address]    Report Status 03/12/2019 FINAL  Final         Radiology Studies: No results found.      Scheduled Meds: . amLODipine  10 mg Oral Daily  . aspirin EC  81 mg Oral Daily  . chlordiazePOXIDE  5 mg Oral TID  . enoxaparin (LOVENOX) injection  40 mg Subcutaneous Q24H  . hydrALAZINE  50 mg Oral Q8H  . metoprolol succinate  25 mg Oral Daily  . potassium chloride  40 mEq Oral BID  . sodium chloride flush  3 mL Intravenous Q12H   Continuous Infusions: . sodium chloride 75 mL/hr at 03/13/19 0153  . magnesium sulfate bolus IVPB 4 g (03/13/19 1029)     LOS: 6 days    Time spent: 30 mins    Charise Killian, MD [facility] Pager [phone]  If 7PM-7AM, please contact night-coverage [email or web address] Password [facility] 03/13/2019, 11:35 AM

## 2019-03-13 NOTE — TOC Progression Note (Signed)
Transition of Care [facility]) - Progression Note    Patient Details  Name: John Spears MRN: [id number] Date of Birth: 1950/12/03  [facility] CM/SW Contact  Barrie Dunker, RN Phone Number: 03/13/2019, 11:44 AM  Clinical Narrative:    Notified The physician that insurance auth is in place   Expected Discharge Plan: Skilled Nursing Facility Barriers to Discharge: Continued Medical Work up  Expected Discharge Plan and Services Expected Discharge Plan: Skilled Nursing Facility     Post Acute Care Choice: Skilled Nursing Facility Living arrangements for the past 2 months: Apartment                                       Social Determinants of Health (SDOH) Interventions    Readmission Risk Interventions No flowsheet data found.

## 2019-03-13 NOTE — Care Management Important Message (Signed)
Important Message  Patient Details  Name: John Spears MRN: [id number] Date of Birth: 1951/01/31   Medicare Important Message Given:  Yes     Starlin Steib Clementeen Hoof, RN 03/13/2019, 11:50 AM

## 2019-03-14 DIAGNOSIS — R5381 Other malaise: Secondary | ICD-10-CM | POA: Diagnosis not present

## 2019-03-14 DIAGNOSIS — D696 Thrombocytopenia, unspecified: Secondary | ICD-10-CM | POA: Diagnosis not present

## 2019-03-14 DIAGNOSIS — D649 Anemia, unspecified: Secondary | ICD-10-CM | POA: Diagnosis not present

## 2019-03-14 DIAGNOSIS — I2510 Atherosclerotic heart disease of native coronary artery without angina pectoris: Secondary | ICD-10-CM | POA: Diagnosis not present

## 2019-03-14 DIAGNOSIS — M6282 Rhabdomyolysis: Secondary | ICD-10-CM | POA: Diagnosis not present

## 2019-03-14 DIAGNOSIS — E876 Hypokalemia: Secondary | ICD-10-CM | POA: Diagnosis not present

## 2019-03-14 DIAGNOSIS — R531 Weakness: Secondary | ICD-10-CM | POA: Diagnosis not present

## 2019-03-14 DIAGNOSIS — M6281 Muscle weakness (generalized): Secondary | ICD-10-CM | POA: Diagnosis not present

## 2019-03-14 DIAGNOSIS — I10 Essential (primary) hypertension: Secondary | ICD-10-CM | POA: Diagnosis not present

## 2019-03-14 DIAGNOSIS — M069 Rheumatoid arthritis, unspecified: Secondary | ICD-10-CM | POA: Diagnosis not present

## 2019-03-14 DIAGNOSIS — Z7401 Bed confinement status: Secondary | ICD-10-CM | POA: Diagnosis not present

## 2019-03-14 DIAGNOSIS — R7401 Elevation of levels of liver transaminase levels: Secondary | ICD-10-CM | POA: Diagnosis not present

## 2019-03-14 DIAGNOSIS — I119 Hypertensive heart disease without heart failure: Secondary | ICD-10-CM | POA: Diagnosis not present

## 2019-03-14 DIAGNOSIS — N182 Chronic kidney disease, stage 2 (mild): Secondary | ICD-10-CM | POA: Diagnosis not present

## 2019-03-14 DIAGNOSIS — T796XXA Traumatic ischemia of muscle, initial encounter: Secondary | ICD-10-CM | POA: Diagnosis not present

## 2019-03-14 DIAGNOSIS — Z8673 Personal history of transient ischemic attack (TIA), and cerebral infarction without residual deficits: Secondary | ICD-10-CM | POA: Diagnosis not present

## 2019-03-14 DIAGNOSIS — Z9181 History of falling: Secondary | ICD-10-CM | POA: Diagnosis not present

## 2019-03-14 DIAGNOSIS — F1020 Alcohol dependence, uncomplicated: Secondary | ICD-10-CM | POA: Diagnosis not present

## 2019-03-14 DIAGNOSIS — M2550 Pain in unspecified joint: Secondary | ICD-10-CM | POA: Diagnosis not present

## 2019-03-14 LAB — COMPREHENSIVE METABOLIC PANEL
ALT: 23 U/L (ref 0–44)
AST: 49 U/L — ABNORMAL HIGH (ref 15–41)
Albumin: 2.6 g/dL — ABNORMAL LOW (ref 3.5–5.0)
Alkaline Phosphatase: 58 U/L (ref 38–126)
Anion gap: 7 (ref 5–15)
BUN: 10 mg/dL (ref 8–23)
CO2: 24 mmol/L (ref 22–32)
Calcium: 7.9 mg/dL — ABNORMAL LOW (ref 8.9–10.3)
Chloride: 110 mmol/L (ref 98–111)
Creatinine, Ser: 0.71 mg/dL (ref 0.61–1.24)
GFR calc Af Amer: >60 mL/min (ref >60)
GFR calc non Af Amer: >60 mL/min (ref >60)
Glucose, Bld: 101 mg/dL — ABNORMAL HIGH (ref 70–99)
Potassium: 3 mmol/L — ABNORMAL LOW (ref 3.5–5.1)
Sodium: 141 mmol/L (ref 135–145)
Total Bilirubin: 0.7 mg/dL (ref 0.3–1.2)
Total Protein: 5.7 g/dL — ABNORMAL LOW (ref 6.5–8.1)

## 2019-03-14 LAB — CBC
HCT: 30.2 % — ABNORMAL LOW (ref 39.0–52.0)
Hemoglobin: 10.1 g/dL — ABNORMAL LOW (ref 13.0–17.0)
MCH: 30.9 pg (ref 26.0–34.0)
MCHC: 33.4 g/dL (ref 30.0–36.0)
MCV: 92.4 fL (ref 80.0–100.0)
Platelets: 190 10*3/uL (ref 150–400)
RBC: 3.27 MIL/uL — ABNORMAL LOW (ref 4.22–5.81)
RDW: 12.8 % (ref 11.5–15.5)
WBC: 5.4 10*3/uL (ref 4.0–10.5)
nRBC: 0 % (ref 0.0–0.2)

## 2019-03-14 LAB — MAGNESIUM: Magnesium: 1.3 mg/dL — ABNORMAL LOW (ref 1.7–2.4)

## 2019-03-14 LAB — PHOSPHORUS: Phosphorus: 2.5 mg/dL (ref 2.5–4.6)

## 2019-03-14 LAB — CK: Total CK: 415 U/L — ABNORMAL HIGH (ref 49–397)

## 2019-03-14 MED ORDER — ASPIRIN 81 MG PO TBEC: 81.0000 mg | DELAYED_RELEASE_TABLET | Freq: Every day | ORAL | Status: DC

## 2019-03-14 MED ORDER — CHLORDIAZEPOXIDE HCL 5 MG PO CAPS: 5.0000 mg | ORAL_CAPSULE | Freq: Three times a day (TID) | ORAL | 0 refills | Status: AC

## 2019-03-14 MED ORDER — POTASSIUM CHLORIDE CRYS ER 20 MEQ PO TBCR
40.0000 meq | EXTENDED_RELEASE_TABLET | Freq: Two times a day (BID) | ORAL | Status: DC
  Administered 2019-03-14: 40 meq via ORAL
  Filled 2019-03-14: qty 2

## 2019-03-14 MED ORDER — ACETAMINOPHEN 325 MG PO TABS: 650.0000 mg | ORAL_TABLET | Freq: Four times a day (QID) | ORAL | Status: DC | PRN

## 2019-03-14 MED ORDER — MAGNESIUM SULFATE 4 GM/100ML IV SOLN
4.0000 g | Freq: Once | INTRAVENOUS | Status: AC
  Administered 2019-03-14: 4 g via INTRAVENOUS
  Filled 2019-03-14: qty 100

## 2019-03-14 MED ORDER — METOPROLOL SUCCINATE ER 25 MG PO TB24: 25.0000 mg | ORAL_TABLET | Freq: Every day | ORAL | 0 refills | Status: DC

## 2019-03-14 MED ORDER — TRAMADOL HCL 50 MG PO TABS: 50.0000 mg | ORAL_TABLET | Freq: Four times a day (QID) | ORAL | 0 refills | Status: AC | PRN

## 2019-03-14 MED ORDER — HYDRALAZINE HCL 50 MG PO TABS: 50.0000 mg | ORAL_TABLET | Freq: Three times a day (TID) | ORAL | 0 refills | Status: DC

## 2019-03-14 MED ORDER — AMLODIPINE BESYLATE 10 MG PO TABS: 10.0000 mg | ORAL_TABLET | Freq: Every day | ORAL | 0 refills | Status: DC

## 2019-03-14 NOTE — Discharge Summary (Signed)
Physician Discharge Summary  Aison Malveaux ZOX:[id number] DOB: 05-Dec-1950 DOA: 03/07/2019  PCP: Derwood Kaplan, MD  Admit date: 03/07/2019 Discharge date: 03/14/2019  Admitted From: home Disposition:  SNF  Recommendations for Outpatient Follow-up:  1. Follow up with PCP in 1-2 weeks  Home Health: n/a Equipment/Devices:  Discharge Condition: stable CODE STATUS: full Diet recommendation: Heart Healthy  Brief/Interim Summary: HPI taken from Dr. Maisie Fus: Aldair Rickel is a 69 y.o. male with medical history significant of  Alcohol abuse heart disease hyperlipidemia hypertension rheumatoid arthritis prior history of stroke who presents to the ED with complaint of bilateral lower extremity pain  right greater than left lower extremity.  Patient states that around 4 days ago he had a mechanical fall where he fell and injured his hip and knee.  States at that time he was able to stand up on his own and ambulate.  Patient states over the next day he had significant amount of pain in his lower extremities his right greater than left.  He states that his pain is centered mostly around in his hips as well as his knees.  However he also noted diffuse  Muscle pain of his lower extremity.  He notes no associated fever or chills with this new lower extremity pain he notes no difficulty with bowel or bladder incontinence he also notes no associated numbness.  He also denies any associated back pain.  Patient presents to the ED due to worsening symptoms and significant pain.  ED Course:  Vitals: Afebrile, BP 167/77,HR 81, RR 20, SAT 98% Labs: WBC 18.4, Hgb 12.1, K 3.0, CR 1.59 at baseline, lactic 5.8 CK 2779, high-sensitivity prone troponin  245,, sed rate 27 alcohol 91    Hospital Course from Dr. Wilfred Lacy 1/13-1/19/21: Pt was found to have traumatic rhabdomyolysis after a fall at home. CK continues to trend down daily w/ IVFs. Of note, pt was started on amlodipine, metoprolol & hydralazine for  uncontrolled HTN. Pt use to take po anti-HTN but pt unsure of the medication and/or dosage. Furthermore, pt was found to have hypokalemia & hypomagnesemia which have been repleated. Also of note, pt has hx of chronic alcoholism and pt has been on librium here and had hand tremors but has since resolved. PT saw the pt and recommended SNF. Pt will likely d/c tomorrow to SNF as the bed will be available there.  Discharge Diagnoses:  Active Problems:   Lower extremity weakness   Transaminitis   Hypokalemia   Hypomagnesemia   Traumatic rhabdomyolysis: secondary to a fall at home. Continue on IVFs. CK continues to trend down daily   HTN: uncontrolled. Continue on amlodipine, hydralazine & metoprolol. IV Hydralazine prn. Use to take po anti-HTN but pt unsure of the medication and/or dosage  Lactic acidosis: with associated metabolic acidosis. Resolved  Leukocytosis: resolved  Chronic alcoholism: alcohol cessation counseling. Monitor for signs/symptoms of w/drawal. Will continue librium TID as pt had hand tremors,which have since resolved. IV Ativan prn  History of CAD: with abnormal cardiac enzymes/abnormal EKG. Most likely elevation due to rhabdo. Echo shows EF 60-65%, grade I diastolic dysfunction & no wall function abnormalities.Continue on tele.  History of rheumatoid arthritis: not currently on any DMARD  History of CVA: Continue aspirin & statin   Hypokalemia: KCl repleated again. Will continue to monitor   Hypomagnesemia: mg sulfate given again.Will continue to monitor   CKDI or II: GFR >60. Creatinine at baseline.  Transaminitis: secondary  to EtOH use. ALT is WNL and AST  is elevated still but continues to trend down daily. Continue to hold statin. Will continue to monitor   Mild elevation in lipase: secondary to EtOH abuse. Currently no imaging or clinical signs of acute pancreatitis  Normocytic Anemia: likely related to EtOH use and marrow suppression/nutritional  deficiency. No need for a transfusion at this time   Thrombocytopenia:  likely related to EtOH use and marrow suppression. Resolved   Generalized weakness: PT is recommending SNF. Pt is agreeable to SNF  Discharge Instructions  Discharge Instructions    Diet - low sodium heart healthy   Complete by: As directed    Discharge instructions   Complete by: As directed    F/u w/ PCP in 1-2 weeks   Increase activity slowly   Complete by: As directed      Allergies as of 03/14/2019   No Known Allergies     Medication List    STOP taking these medications   BC Fast Pain Relief Arthritis 1000-65 MG Pack Generic drug: Aspirin-Caffeine     TAKE these medications   acetaminophen 325 MG tablet Commonly known as: TYLENOL Take 2 tablets (650 mg total) by mouth every 6 (six) hours as needed for mild pain or fever (or Fever >/= 101).   amLODipine 10 MG tablet Commonly known as: NORVASC Take 1 tablet (10 mg total) by mouth daily. Start taking on: March 15, 2019   aspirin 81 MG EC tablet Take 1 tablet (81 mg total) by mouth daily. Start taking on: March 15, 2019   chlordiazePOXIDE 5 MG capsule Commonly known as: LIBRIUM Take 1 capsule (5 mg total) by mouth 3 (three) times daily for 5 days.   hydrALAZINE 50 MG tablet Commonly known as: APRESOLINE Take 1 tablet (50 mg total) by mouth every 8 (eight) hours.   metoprolol succinate 25 MG 24 hr tablet Commonly known as: TOPROL-XL Take 1 tablet (25 mg total) by mouth daily. Start taking on: March 15, 2019   traMADol 50 MG tablet Commonly known as: ULTRAM Take 1 tablet (50 mg total) by mouth every 6 (six) hours as needed for up to 3 days for moderate pain or severe pain.       No Known Allergies  Consultations:  n/a   Procedures/Studies: DG Chest 1 View  Result Date: 03/07/2019 CLINICAL DATA:  Altered mental status. EXAM: CHEST  1 VIEW COMPARISON:  October 22, 2016. FINDINGS: The heart size and mediastinal contours  are within normal limits. Both lungs are clear. The visualized skeletal structures are unremarkable. IMPRESSION: No active disease. Electronically Signed   By: Lupita Raider M.D.   On: 03/07/2019 21:33   DG Knee 1-2 Views Right  Result Date: 03/07/2019 CLINICAL DATA:  Right knee pain after fall. EXAM: RIGHT KNEE - 1-2 VIEW COMPARISON:  None. FINDINGS: No evidence of fracture or dislocation. Mild narrowing of medial joint space is noted. Soft tissues are unremarkable. IMPRESSION: Mild degenerative joint disease is noted medially. No definite fracture or dislocation is seen. Electronically Signed   By: Lupita Raider M.D.   On: 03/07/2019 19:58   CT Head Wo Contrast  Result Date: 03/07/2019 CLINICAL DATA:  Larey Seat from a standing position. Trauma to the head and neck. EXAM: CT HEAD WITHOUT CONTRAST CT CERVICAL SPINE WITHOUT CONTRAST TECHNIQUE: Multidetector CT imaging of the head and cervical spine was performed following the standard protocol without intravenous contrast. Multiplanar CT image reconstructions of the cervical spine were also generated. COMPARISON:  February 2018. FINDINGS: CT  HEAD FINDINGS Brain: Age advanced generalized atrophy. Chronic small-vessel ischemic changes of the hemispheric white matter. No sign of acute infarction, mass lesion, hemorrhage, hydrocephalus or extra-axial collection. Vascular: There is atherosclerotic calcification of the major vessels at the base of the brain. Skull: No skull fracture. Sinuses/Orbits: Clear/normal Other: Right forehead scalp lipoma. CT CERVICAL SPINE FINDINGS Alignment: Normal Skull base and vertebrae: No traumatic finding or focal bone finding. Soft tissues and spinal canal: Negative Disc levels: Degenerative spondylosis at C5-6 with disc space narrowing. No significant osteophytic stenosis of the canal or foramina. No advanced facet arthritis. Upper chest: Negative Other: None IMPRESSION: Head CT: No acute or traumatic finding. Atrophy and chronic  small-vessel ischemic changes. Cervical spine CT: No acute or traumatic finding. Mild midcervical spondylosis. Electronically Signed   By: Paulina Fusi M.D.   On: 03/07/2019 20:19   CT Cervical Spine Wo Contrast  Result Date: 03/07/2019 CLINICAL DATA:  Larey Seat from a standing position. Trauma to the head and neck. EXAM: CT HEAD WITHOUT CONTRAST CT CERVICAL SPINE WITHOUT CONTRAST TECHNIQUE: Multidetector CT imaging of the head and cervical spine was performed following the standard protocol without intravenous contrast. Multiplanar CT image reconstructions of the cervical spine were also generated. COMPARISON:  February 2018. FINDINGS: CT HEAD FINDINGS Brain: Age advanced generalized atrophy. Chronic small-vessel ischemic changes of the hemispheric white matter. No sign of acute infarction, mass lesion, hemorrhage, hydrocephalus or extra-axial collection. Vascular: There is atherosclerotic calcification of the major vessels at the base of the brain. Skull: No skull fracture. Sinuses/Orbits: Clear/normal Other: Right forehead scalp lipoma. CT CERVICAL SPINE FINDINGS Alignment: Normal Skull base and vertebrae: No traumatic finding or focal bone finding. Soft tissues and spinal canal: Negative Disc levels: Degenerative spondylosis at C5-6 with disc space narrowing. No significant osteophytic stenosis of the canal or foramina. No advanced facet arthritis. Upper chest: Negative Other: None IMPRESSION: Head CT: No acute or traumatic finding. Atrophy and chronic small-vessel ischemic changes. Cervical spine CT: No acute or traumatic finding. Mild midcervical spondylosis. Electronically Signed   By: Paulina Fusi M.D.   On: 03/07/2019 20:19   MR BRAIN W WO CONTRAST  Result Date: 03/08/2019 CLINICAL DATA:  Mental status change with CNS infection suspected EXAM: MRI HEAD WITHOUT AND WITH CONTRAST TECHNIQUE: Multiplanar, multiecho pulse sequences of the brain and surrounding structures were obtained without and with  intravenous contrast. CONTRAST:  7.88mL GADAVIST GADOBUTROL 1 MMOL/ML IV SOLN COMPARISON:  Head CT from yesterday.  Brain MRI 04/05/2016 FINDINGS: Brain: Generalized atrophy that is prominent for age. Chronic small vessel ischemic type change in the deep cerebral white matter with chronic lacune in the right thalamus. No acute infarct, hemorrhage, hydrocephalus, or masslike finding. No abnormal intracranial enhancement. Vascular: Preserved flow voids. Skull and upper cervical spine: Negative for marrow lesion. Right frontal scalp lipoma. Sinuses/Orbits: Mild patchy ethmoid mucosal opacification. Chronic mild deformity along the medial wall right orbit. Other: Motion degraded study such that fast brain protocol was used. IMPRESSION: 1. No acute finding. 2. Atrophy and chronic small vessel ischemia greater than expected for age. Electronically Signed   By: Marnee Spring M.D.   On: 03/08/2019 04:53   MR Lumbar Spine W Wo Contrast  Result Date: 03/08/2019 CLINICAL DATA:  Bilateral lower extremity pain EXAM: MRI LUMBAR SPINE WITHOUT AND WITH CONTRAST TECHNIQUE: Multiplanar and multiecho pulse sequences of the lumbar spine were obtained without and with intravenous contrast. CONTRAST:  7.35mL GADAVIST GADOBUTROL 1 MMOL/ML IV SOLN COMPARISON:  None. FINDINGS: Segmentation:  5  lumbar type vertebrae Alignment:  Normal Vertebrae: L3 isointense foci likely reflecting islands of red marrow. There is generalized marrow heterogeneity which is nonspecific but overall benign. Conus medullaris and cauda equina: Conus extends to the L1 level. Conus and cauda equina appear normal. Paraspinal and other soft tissues: Negative Disc levels: Diffuse congenital stenosis of the spinal canal with epidural lipomatosis effacing the thecal sac from L2-S1. T12- L1: Unremarkable. L1-L2: Mild disc bulging.  No degenerative impingement L2-L3: Mild disc bulging.  No degenerative impingement L3-L4: Disc bulging and mild facet spurring. No  degenerative impingement L4-L5: Circumferential disc bulging and posterior element hypertrophy. Mild-to-moderate bilateral foraminal narrowing L5-S1:Incomplete segmentation with no degenerative changes IMPRESSION: 1. No acute finding. 2. Congenital spinal stenosis (from short pedicles) and epidural lipomatosis causes high-grade thecal sac effacement from L2-S1. Electronically Signed   By: Marnee Spring M.D.   On: 03/08/2019 04:49   DG Lumbar Spine 1 View  Result Date: 03/07/2019 CLINICAL DATA:  Low back pain after fall last week. EXAM: LUMBAR SPINE - 1 VIEW COMPARISON:  August 11, 2004. FINDINGS: Only AP projection was obtained. Spondylolisthesis therefore cannot be excluded on this 1 projection. No definite fracture is noted, but this also cannot be completely excluded without lateral projection. IMPRESSION: Limited examination as only an AP projection is obtained. Spondylolisthesis or fracture cannot be excluded on the basis of this study. Lateral projection is recommended for further evaluation. Electronically Signed   By: Lupita Raider M.D.   On: 03/07/2019 19:58   ECHOCARDIOGRAM COMPLETE  Result Date: 03/08/2019   ECHOCARDIOGRAM REPORT   Patient Name:   KANYON SEIBOLD Date of Exam: 03/08/2019 Medical Rec #:  [id number]      Height:       71.0 in Accession #:    [id number]     Weight:       180.0 lb Date of Birth:  24-Sep-1950      BSA:          2.02 m Patient Age:    68 years       BP:           197/94 mmHg Patient Gender: M              HR:           83 bpm. Exam Location:  [facility] Procedure: 2D Echo, Cardiac Doppler and Color Doppler Indications:     Chest pain 786.50  History:         Patient has prior history of Echocardiogram examinations, most                  recent 04/06/2016. Risk Factors:Hypertension.  Sonographer:     Cristela Blue RDCS (AE) Referring Phys:  [id number] Beulah Gandy A THOMAS Diagnosing Phys: Debbe Odea MD IMPRESSIONS  1. Left ventricular ejection fraction, by visual estimation, is 60  to 65%. The left ventricle has normal function. There is mildly increased left ventricular hypertrophy.  2. Left ventricular diastolic parameters are consistent with Grade I diastolic dysfunction (impaired relaxation).  3. The left ventricle has no regional wall motion abnormalities.  4. Global right ventricle has normal systolic function.The right ventricular size is normal. No increase in right ventricular wall thickness.  5. Left atrial size was mildly dilated.  6. Right atrial size was mildly dilated.  7. The mitral valve is normal in structure. No evidence of mitral valve regurgitation. No evidence of mitral stenosis.  8. The tricuspid valve is normal in structure.  9.  The aortic valve is normal in structure. Aortic valve regurgitation is not visualized. No evidence of aortic valve sclerosis or stenosis. 10. The pulmonic valve was not well visualized. Pulmonic valve regurgitation is not visualized. 11. The inferior vena cava is normal in size with greater than 50% respiratory variability, suggesting right atrial pressure of 3 mmHg. FINDINGS  Left Ventricle: Left ventricular ejection fraction, by visual estimation, is 60 to 65%. The left ventricle has normal function. The left ventricle has no regional wall motion abnormalities. The left ventricular internal cavity size was the left ventricle is normal in size. There is mildly increased left ventricular hypertrophy. Left ventricular diastolic parameters are consistent with Grade I diastolic dysfunction (impaired relaxation). Normal left atrial pressure. Right Ventricle: The right ventricular size is normal. No increase in right ventricular wall thickness. Global RV systolic function is has normal systolic function. Left Atrium: Left atrial size was mildly dilated. Right Atrium: Right atrial size was mildly dilated Pericardium: There is no evidence of pericardial effusion. Mitral Valve: The mitral valve is normal in structure. No evidence of mitral valve  regurgitation. No evidence of mitral valve stenosis by observation. Tricuspid Valve: The tricuspid valve is normal in structure. Tricuspid valve regurgitation is not demonstrated. Aortic Valve: The aortic valve is normal in structure. Aortic valve regurgitation is not visualized. The aortic valve is structurally normal, with no evidence of sclerosis or stenosis. Aortic valve mean gradient measures 4.7 mmHg. Aortic valve peak gradient measures 7.7 mmHg. Aortic valve area, by VTI measures 1.85 cm. Pulmonic Valve: The pulmonic valve was not well visualized. Pulmonic valve regurgitation is not visualized. Pulmonic regurgitation is not visualized. Aorta: The aortic root is normal in size and structure. Venous: The inferior vena cava was not well visualized. The inferior vena cava is normal in size with greater than 50% respiratory variability, suggesting right atrial pressure of 3 mmHg. IAS/Shunts: No atrial level shunt detected by color flow Doppler. There is no evidence of a patent foramen ovale.  LEFT VENTRICLE PLAX 2D LVIDd:         3.87 cm  Diastology LVIDs:         2.74 cm  LV e' lateral:   6.42 cm/s LV PW:         1.24 cm  LV E/e' lateral: 9.5 LV IVS:        1.08 cm  LV e' medial:    6.31 cm/s LVOT diam:     2.10 cm  LV E/e' medial:  9.7 LV SV:         37 ml LV SV Index:   18.08 LVOT Area:     3.46 cm  RIGHT VENTRICLE RV Basal diam:  3.67 cm RV S prime:     19.80 cm/s TAPSE (M-mode): 3.9 cm LEFT ATRIUM             Index       RIGHT ATRIUM           Index LA diam:        4.00 cm 1.98 cm/m  RA Area:     23.40 cm LA Vol (A2C):   78.2 ml 38.79 ml/m RA Volume:   70.70 ml  35.07 ml/m LA Vol (A4C):   80.9 ml 40.12 ml/m LA Biplane Vol: 84.6 ml 41.96 ml/m  AORTIC VALVE                   PULMONIC VALVE AV Area (Vmax):    1.99 cm  PV Vmax:        1.01 m/s AV Area (Vmean):   1.61 cm    PV Peak grad:   4.1 mmHg AV Area (VTI):     1.85 cm    RVOT Peak grad: 7 mmHg AV Vmax:           139.00 cm/s AV Vmean:           98.033 cm/s AV VTI:            0.273 m AV Peak Grad:      7.7 mmHg AV Mean Grad:      4.7 mmHg LVOT Vmax:         79.80 cm/s LVOT Vmean:        45.600 cm/s LVOT VTI:          0.146 m LVOT/AV VTI ratio: 0.53  AORTA Ao Root diam: 3.30 cm MITRAL VALVE                         TRICUSPID VALVE MV Area (PHT): 2.93 cm              TR Peak grad:   24.6 mmHg MV PHT:        75.11 msec            TR Vmax:        248.00 cm/s MV Decel Time: 259 msec MV E velocity: 61.10 cm/s  103 cm/s  SHUNTS MV A velocity: 101.00 cm/s 70.3 cm/s Systemic VTI:  0.15 m MV E/A ratio:  0.60        1.5       Systemic Diam: 2.10 cm  Debbe Odea MD Electronically signed by Debbe Odea MD Signature Date/Time: 03/08/2019/2:03:41 PM    Final    DG Hip Unilat W or Wo Pelvis 2-3 Views Left  Result Date: 03/07/2019 CLINICAL DATA:  Left hip pain after fall last week. EXAM: DG HIP (WITH OR WITHOUT PELVIS) 2-3V LEFT COMPARISON:  None. FINDINGS: There is no evidence of hip fracture or dislocation. There is no evidence of arthropathy or other focal bone abnormality. IMPRESSION: Negative. Electronically Signed   By: Lupita Raider M.D.   On: 03/07/2019 20:01   DG Hip Unilat W or Wo Pelvis 2-3 Views Right  Result Date: 03/07/2019 CLINICAL DATA:  Right femur pain after fall last week. EXAM: DG HIP (WITH OR WITHOUT PELVIS) 2-3V RIGHT COMPARISON:  None. FINDINGS: Status post right hip arthroplasty. No fracture or dislocation is noted. Soft tissues are unremarkable. IMPRESSION: No acute abnormality is noted. Electronically Signed   By: Lupita Raider M.D.   On: 03/07/2019 20:00      Subjective: Pt c/o malaise   Discharge Exam: Vitals:   03/13/19 2354 03/14/19 0758  BP: (!) 153/82 (!) 164/88  Pulse: 87 86  Resp: 17 17  Temp: 99.1 F (37.3 C) 98.6 F (37 C)  SpO2: 99% 99%   Vitals:   03/13/19 1634 03/13/19 2100 03/13/19 2354 03/14/19 0758  BP: 133/77 (!) 145/82 (!) 153/82 (!) 164/88  Pulse: 79  87 86  Resp: Temp:  98.9 F (37.2 C)  99.1 F (37.3 C) 98.6 F (37 C)  TempSrc: Oral  Oral Oral  SpO2: 100%  99% 99%  Weight:      Height:        General: Pt is alert, awake, not in acute distress Cardiovascular:  S1/S2 +, no  rubs, no gallops Respiratory: CTA bilaterally, no rales Abdominal: Soft, NT, ND, bowel sounds + Extremities: mild bl LE edema, no cyanosis    The results of significant diagnostics from this hospitalization (including imaging, microbiology, ancillary and laboratory) are listed below for reference.     Microbiology: Recent Results (from the past 240 hour(s))  SARS CORONAVIRUS 2 (TAT 6-24 HRS) Nasopharyngeal     Status: None   Collection Time: 03/07/19  8:07 PM   Specimen: Nasopharyngeal  Result Value Ref Range Status   SARS Coronavirus 2 NEGATIVE NEGATIVE Final    Comment: (NOTE) SARS-CoV-2 target nucleic acids are NOT DETECTED. The SARS-CoV-2 RNA is generally detectable in upper and lower respiratory specimens during the acute phase of infection. Negative results do not preclude SARS-CoV-2 infection, do not rule out co-infections with other pathogens, and should not be used as the sole basis for treatment or other patient management decisions. Negative results must be combined with clinical observations, patient history, and epidemiological information. The expected result is Negative. Fact Sheet for Patients: SugarRoll.be Fact Sheet for Healthcare Providers: https://www.woods-mathews.com/ This test is not yet approved or cleared by the [place] FDA and  has been authorized for detection and/or diagnosis of SARS-CoV-2 by FDA under an Emergency Use Authorization (EUA). This EUA will remain  in effect (meaning this test can be used) for the duration of the COVID-19 declaration under Section 56 4(b)(1) of the Act, 21 U.S.C. section 360bbb-3(b)(1), unless the authorization is terminated or revoked sooner. Performed at [facility], [place] [address] [place], [place] [address]   Urine culture     Status: None   Collection Time: 03/07/19  8:07 PM   Specimen: Urine, Random  Result Value Ref Range Status   Specimen Description   Final    URINE, RANDOM Performed at [facility], [address] [place], [place] [address]    Special Requests   Final    NONE Performed at [facility], [address] [place], [place] [address]    Culture   Final    NO GROWTH Performed at [facility], [place] [address] [place], [place] [address]    Report Status 03/08/2019 FINAL  Final  Culture, blood (routine x 2)     Status: None   Collection Time: 03/07/19 10:13 PM   Specimen: BLOOD  Result Value Ref Range Status   Specimen Description BLOOD RIGHT ANTECUBITAL  Final   Special Requests   Final    BOTTLES DRAWN AEROBIC AND ANAEROBIC Blood Culture results may not be optimal due to an excessive volume of blood received in culture bottles   Culture   Final    NO GROWTH 5 DAYS Performed at [facility], [address] [place], [place] [address]    Report Status 03/12/2019 FINAL  Final  Culture, blood (routine x 2)     Status: None   Collection Time: 03/07/19 10:13 PM   Specimen: BLOOD  Result Value Ref Range Status   Specimen Description BLOOD RIGHT ANTECUBITAL  Final   Special Requests   Final    BOTTLES DRAWN AEROBIC AND ANAEROBIC Blood Culture results may not be optimal due to an excessive volume of blood received in culture bottles   Culture   Final    NO GROWTH 5 DAYS Performed at [facility], [address], [place], [place] [address]    Report Status 03/12/2019 FINAL  Final  SARS CORONAVIRUS 2 (TAT 6-24 HRS) Nasopharyngeal  Nasopharyngeal Swab     Status: None   Collection Time: 03/13/19 10:01 AM   Specimen: Nasopharyngeal Swab  Result Value Ref Range Status   SARS Coronavirus 2 NEGATIVE NEGATIVE Final    Comment: (NOTE) SARS-CoV-2 target nucleic acids are  NOT DETECTED. The SARS-CoV-2 RNA is generally detectable in upper and lower respiratory specimens during the acute phase of infection. Negative results do not preclude SARS-CoV-2 infection, do not rule out co-infections with other pathogens, and should not be used as the sole basis for treatment or other patient management decisions. Negative results must be combined with clinical observations, patient history, and epidemiological information. The expected result is Negative. Fact Sheet for Patients: HairSlick.no Fact Sheet for Healthcare Providers: quierodirigir.com This test is not yet approved or cleared by the [place] FDA and  has been authorized for detection and/or diagnosis of SARS-CoV-2 by FDA under an Emergency Use Authorization (EUA). This EUA will remain  in effect (meaning this test can be used) for the duration of the COVID-19 declaration under Section 56 4(b)(1) of the Act, 21 U.S.C. section 360bbb-3(b)(1), unless the authorization is terminated or revoked sooner. Performed at [facility], [address] [place], [place] [address]      Labs: BNP (last 3 results) Recent Labs    03/08/19 0130  BNP 459.0*   Basic Metabolic Panel: Recent Labs  Lab 03/10/19 0726 03/11/19 0636 03/12/19 0446 03/13/19 0609 03/13/19 1855 03/14/19 0443  NA 136 138 140 140  --  141  K 2.6* 2.7* 3.0* 2.5*  --  3.0*  CL 99 103 106 105  --  110  CO2 22 20* 24 24  --  24  GLUCOSE 75 74 85 86  --  101*  BUN 10 13 10  7*  --  10  CREATININE 0.85 0.89 0.79 0.76  --  0.71  CALCIUM 7.7* 7.6* 7.7* 7.6*  --  7.9*  MG  --   --   --  0.7* 1.6* 1.3*  PHOS  --   --   --   --   --  2.5   Liver Function Tests: Recent Labs  Lab 03/10/19 0726 03/11/19 0636 03/12/19 0446 03/13/19 0609 03/14/19 0443  AST 180* 111* 86* 65* 49*  ALT 46* 33 28 26 23   ALKPHOS 65 57 60 58 58  BILITOT 1.5* 1.4* 1.1 1.0 0.7  PROT 6.4* 6.0* 6.1*  5.7* 5.7*  ALBUMIN 3.1* 2.7* 2.8* 2.8* 2.6*   Recent Labs  Lab 03/07/19 2007  LIPASE 60*   Recent Labs  Lab 03/07/19 2007  AMMONIA 15   CBC: Recent Labs  Lab 03/07/19 1856 03/07/19 1856 03/08/19 0130 03/08/19 0130 03/08/19 1234 03/09/19 0522 03/10/19 0726 03/11/19 0636 03/12/19 0446 03/13/19 0609 03/14/19 0443  WBC 18.4*   < > 15.2*   < > 13.9*   < > 10.7* 8.5 7.4 6.3 5.4  NEUTROABS 17.0*  --  13.3*  --  11.9*  --   --   --   --   --   --   HGB 12.1*   < > 10.9*   < > 10.9*   < > 11.1* 10.3* 10.4* 9.9* 10.1*  HCT 36.9*   < > 32.4*   < > 32.7*   < > 32.2* 30.3* 30.5* 29.5* 30.2*  MCV 95.6   < > 94.2   < > 94.8   < > 91.7 92.7 93.8 93.1 92.4  PLT 151   < > 129*   < >  122*   < > 110* 110* 130* 158 190   < > = values in this interval not displayed.   Cardiac Enzymes: Recent Labs  Lab 03/10/19 0726 03/11/19 0636 03/12/19 0446 03/13/19 0609 03/14/19 0443  CKTOTAL 5,370* 3,099* 1,850* 795* 415*   BNP: Invalid input(s): POCBNP CBG: No results for input(s): GLUCAP in the last 168 hours. D-Dimer No results for input(s): DDIMER in the last 72 hours. Hgb A1c No results for input(s): HGBA1C in the last 72 hours. Lipid Profile No results for input(s): CHOL, HDL, LDLCALC, TRIG, CHOLHDL, LDLDIRECT in the last 72 hours. Thyroid function studies No results for input(s): TSH, T4TOTAL, T3FREE, THYROIDAB in the last 72 hours.  Invalid input(s): FREET3 Anemia work up No results for input(s): VITAMINB12, FOLATE, FERRITIN, TIBC, IRON, RETICCTPCT in the last 72 hours. Urinalysis    Component Value Date/Time   COLORURINE YELLOW (A) 03/07/2019 2007   APPEARANCEUR CLOUDY (A) 03/07/2019 2007   LABSPEC 1.015 03/07/2019 2007   PHURINE 5.0 03/07/2019 2007   GLUCOSEU NEGATIVE 03/07/2019 2007   HGBUR LARGE (A) 03/07/2019 2007   BILIRUBINUR NEGATIVE 03/07/2019 2007   KETONESUR 5 (A) 03/07/2019 2007   PROTEINUR 100 (A) 03/07/2019 2007   NITRITE NEGATIVE 03/07/2019 2007    LEUKOCYTESUR NEGATIVE 03/07/2019 2007   Sepsis Labs Invalid input(s): PROCALCITONIN,  WBC,  LACTICIDVEN Microbiology Recent Results (from the past 240 hour(s))  SARS CORONAVIRUS 2 (TAT 6-24 HRS) Nasopharyngeal     Status: None   Collection Time: 03/07/19  8:07 PM   Specimen: Nasopharyngeal  Result Value Ref Range Status   SARS Coronavirus 2 NEGATIVE NEGATIVE Final    Comment: (NOTE) SARS-CoV-2 target nucleic acids are NOT DETECTED. The SARS-CoV-2 RNA is generally detectable in upper and lower respiratory specimens during the acute phase of infection. Negative results do not preclude SARS-CoV-2 infection, do not rule out co-infections with other pathogens, and should not be used as the sole basis for treatment or other patient management decisions. Negative results must be combined with clinical observations, patient history, and epidemiological information. The expected result is Negative. Fact Sheet for Patients: HairSlick.no Fact Sheet for Healthcare Providers: quierodirigir.com This test is not yet approved or cleared by the [place] FDA and  has been authorized for detection and/or diagnosis of SARS-CoV-2 by FDA under an Emergency Use Authorization (EUA). This EUA will remain  in effect (meaning this test can be used) for the duration of the COVID-19 declaration under Section 56 4(b)(1) of the Act, 21 U.S.C. section 360bbb-3(b)(1), unless the authorization is terminated or revoked sooner. Performed at [facility], [address], [place], [place] [address]   Urine culture     Status: None   Collection Time: 03/07/19  8:07 PM   Specimen: Urine, Random  Result Value Ref Range Status   Specimen Description   Final    URINE, RANDOM Performed at [facility], [address], [place], [place] [address]    Special Requests   Final    NONE Performed at [facility], [address]  [place], [place] [address]    Culture   Final    NO GROWTH Performed at [facility], [address] [place], [place], [place] [address]    Report Status 03/08/2019 FINAL  Final  Culture, blood (routine x 2)     Status: None   Collection Time: 03/07/19 10:13 PM   Specimen: BLOOD  Result Value Ref Range Status   Specimen Description BLOOD RIGHT  ANTECUBITAL  Final   Special Requests   Final    BOTTLES DRAWN AEROBIC AND ANAEROBIC Blood Culture results may not be optimal due to an excessive volume of blood received in culture bottles   Culture   Final    NO GROWTH 5 DAYS Performed at [facility], [address] [place], [place] [address]    Report Status 03/12/2019 FINAL  Final  Culture, blood (routine x 2)     Status: None   Collection Time: 03/07/19 10:13 PM   Specimen: BLOOD  Result Value Ref Range Status   Specimen Description BLOOD RIGHT ANTECUBITAL  Final   Special Requests   Final    BOTTLES DRAWN AEROBIC AND ANAEROBIC Blood Culture results may not be optimal due to an excessive volume of blood received in culture bottles   Culture   Final    NO GROWTH 5 DAYS Performed at [facility], [address], [place], [place] [address]    Report Status 03/12/2019 FINAL  Final  SARS CORONAVIRUS 2 (TAT 6-24 HRS) Nasopharyngeal Nasopharyngeal Swab     Status: None   Collection Time: 03/13/19 10:01 AM   Specimen: Nasopharyngeal Swab  Result Value Ref Range Status   SARS Coronavirus 2 NEGATIVE NEGATIVE Final    Comment: (NOTE) SARS-CoV-2 target nucleic acids are NOT DETECTED. The SARS-CoV-2 RNA is generally detectable in upper and lower respiratory specimens during the acute phase of infection. Negative results do not preclude SARS-CoV-2 infection, do not rule out co-infections with other pathogens, and should not be used as the sole basis for treatment or other patient management decisions. Negative results must be combined with clinical observations, patient history,  and epidemiological information. The expected result is Negative. Fact Sheet for Patients: HairSlick.no Fact Sheet for Healthcare Providers: quierodirigir.com This test is not yet approved or cleared by the [place] FDA and  has been authorized for detection and/or diagnosis of SARS-CoV-2 by FDA under an Emergency Use Authorization (EUA). This EUA will remain  in effect (meaning this test can be used) for the duration of the COVID-19 declaration under Section 56 4(b)(1) of the Act, 21 U.S.C. section 360bbb-3(b)(1), unless the authorization is terminated or revoked sooner. Performed at [facility], [address] [place], [place] [address]      Time coordinating discharge: Over 30 minutes  SIGNED:   Charise Killian, MD  [facility] 03/14/2019, 12:07 PM Pager   If 7PM-7AM, please contact night-coverage [email or web address] Password [email or web address]

## 2019-03-14 NOTE — Progress Notes (Signed)
Patient transferred to [facility] via Stretcher, VSS , IV's removed from Bilateral arms, Report called to RN and given to EMS , with signed hard rx for patient .

## 2019-03-14 NOTE — Progress Notes (Signed)
Report called to RN at [facility] ,

## 2019-03-14 NOTE — TOC Progression Note (Addendum)
Transition of Care [facility]) - Progression Note    Patient Details  Name: John Spears MRN: [id number] Date of Birth: Jan 28, 1951  Transition of Care [facility]) CM/SW Contact  Barrie Dunker, RN Phone Number: 03/14/2019, 1:20 PM  Clinical Narrative:     The patient and [name] decided that they do not want the patient to go to [facility] and want to go to [facility] instead, I notified kelly with [facility], I sent the DC summary thru to [facility] via the HUB and notified her that [facility] is in place however the facility will need to be changed, I explained that the DC is in Place and that the patient will DC today.   Expected Discharge Plan: Skilled Nursing Facility Barriers to Discharge: Continued Medical Work up  Expected Discharge Plan and Services Expected Discharge Plan: Skilled Nursing Facility     Post Acute Care Choice: Skilled Nursing Facility Living arrangements for the past 2 months: Apartment Expected Discharge Date: 03/14/19                                     Social Determinants of Health (SDOH) Interventions    Readmission Risk Interventions No flowsheet data found.

## 2019-03-14 NOTE — TOC Progression Note (Signed)
Transition of Care [facility]) - Progression Note    Patient Details  Name: Rhet Rorke MRN: [id number] Date of Birth: 09-30-1950  Transition of Care [facility]) CM/SW Contact  Barrie Dunker, RN Phone Number: 03/14/2019, 10:14 AM  Clinical Narrative:     Inetta Fermo with [facility] notified me that they would not be able to take the patient today due to bed availability, I notified the physician  Expected Discharge Plan: Skilled Nursing Facility Barriers to Discharge: Continued Medical Work up  Expected Discharge Plan and Services Expected Discharge Plan: Skilled Nursing Facility     Post Acute Care Choice: Skilled Nursing Facility Living arrangements for the past 2 months: Apartment                                       Social Determinants of Health (SDOH) Interventions    Readmission Risk Interventions No flowsheet data found.

## 2019-03-14 NOTE — Progress Notes (Addendum)
Physical Therapy Treatment Patient Details Name: John Spears MRN: [id number] DOB: 16-Dec-1950 Today's Date: 03/14/2019    History of Present Illness John Spears is a 42JWJ who comes to [facility] on 1/12 c BLE pain. 4 days ago he had a mechanical fall where he fell and injured his hip and knee.  PMH: Alcohol abuse heart disease hyperlipidemia hypertension rheumatoid arthritis prior history of stroke. Pt admitted with Traumatic rhabdomyolysis.    PT Comments    Pt reports feeling "better" today and did not report pain.  He was able to perform bed mobility mod I and sit at bedside with supervision.  Pt impulsive and starting to stand several times with need for PT to prompt him to sit still during chair setup.  Pt able to laterally scoot to recliner with arm dropped, supervision, and PT assisted with positioning.  Pt with strength of UE/LE's enough to support himself when scooting along EOB.  Pt's sister present and preparing to cut pt's hair, PT assisted with setup and positioning.  Pt will continue to benefit from skilled PT with focus on strength, tolerance to activity and safe functional mobility.   Follow Up Recommendations  SNF;Supervision for mobility/OOB;Supervision - Intermittent     Equipment Recommendations  Rolling walker with 5" wheels    Recommendations for Other Services       Precautions / Restrictions Precautions Precautions: Fall Restrictions Weight Bearing Restrictions: No    Mobility  Bed Mobility Overal bed mobility: Modified Independent             General bed mobility comments: Increased time to get to bedside with HOB elevated.  Transfers Overall transfer level: Needs assistance Equipment used: None Transfers: Lateral/Scoot Transfers Sit to Stand: Supervision         General transfer comment: Pt able to scoot to recliner with arm dropped and recliner positioned against bedside.  Good use of UE/LE.  Ambulation/Gait                  Stairs             Wheelchair Mobility    Modified Rankin (Stroke Patients Only)       Balance Overall balance assessment: Needs assistance Sitting-balance support: No upper extremity supported;Feet supported Sitting balance-Leahy Scale: Good     Standing balance support: Bilateral upper extremity supported;During functional activity Standing balance-Leahy Scale: Fair                              Cognition Arousal/Alertness: Awake/alert Behavior During Therapy: WFL for tasks assessed/performed Overall Cognitive Status: Within Functional Limits for tasks assessed                                        Exercises Other Exercises Other Exercises: Assistance with setup and positioning as pt's sister plans to cut his hair in the recliner. x4 min    General Comments        Pertinent Vitals/Pain Pain Assessment: No/denies pain    Home Living                      Prior Function            PT Goals (current goals can now be found in the care plan section) Acute Rehab PT Goals Patient Stated Goal: To have less pain and  be more independent Progress towards PT goals: Progressing toward goals    Frequency    Min 2X/week      PT Plan Current plan remains appropriate    Co-evaluation              AM-PAC PT "6 Clicks" Mobility   Outcome Measure  Help needed turning from your back to your side while in a flat bed without using bedrails?: A Little Help needed moving from lying on your back to sitting on the side of a flat bed without using bedrails?: A Little Help needed moving to and from a bed to a chair (including a wheelchair)?: A Lot Help needed standing up from a chair using your arms (e.g., wheelchair or bedside chair)?: A Little Help needed to walk in hospital room?: Total Help needed climbing 3-5 steps with a railing? : Total 6 Click Score: 13    End of Session   Activity Tolerance: Patient tolerated  treatment well Patient left: with call bell/phone within reach;with family/visitor present;in chair;with chair alarm set Nurse Communication: Mobility status PT Visit Diagnosis: Unsteadiness on feet (R26.81);Difficulty in walking, not elsewhere classified (R26.2);Other abnormalities of gait and mobility (R26.89);Muscle weakness (generalized) (M62.81)     Time: 4580-9983 PT Time Calculation (min) (ACUTE ONLY): 18 min  Charges:  $Therapeutic Activity: 8-22 mins                     Glenetta Hew, PT, DPT  Glenetta Hew 03/14/2019, 1:09 PM

## 2019-03-14 NOTE — Progress Notes (Signed)
OT Cancellation Note  Patient Details Name: John Spears MRN: [id number] DOB: 11-27-1950   Cancelled Treatment:    Reason Eval/Treat Not Completed: Other (comment). Upon attempt for OT tx, pt just starting to eat meal. Reports he is eager to participate after eating and wants to get up to the recliner because his sister is coming to cut his hair. Spoke with PT to coordinate therapy and attempt to meet pt's requested timeframe for when therapy comes back.   Richrd Prime, MPH, MS, OTR/L ascom [phone] 03/14/19, 11:30 AM

## 2019-03-15 DIAGNOSIS — Z9181 History of falling: Secondary | ICD-10-CM | POA: Diagnosis not present

## 2019-03-15 DIAGNOSIS — M6282 Rhabdomyolysis: Secondary | ICD-10-CM | POA: Diagnosis not present

## 2019-03-15 DIAGNOSIS — M069 Rheumatoid arthritis, unspecified: Secondary | ICD-10-CM | POA: Diagnosis not present

## 2019-03-15 DIAGNOSIS — I119 Hypertensive heart disease without heart failure: Secondary | ICD-10-CM | POA: Diagnosis not present

## 2019-03-21 DIAGNOSIS — N182 Chronic kidney disease, stage 2 (mild): Secondary | ICD-10-CM | POA: Diagnosis not present

## 2019-03-21 DIAGNOSIS — M6281 Muscle weakness (generalized): Secondary | ICD-10-CM | POA: Diagnosis not present

## 2019-03-21 DIAGNOSIS — M6282 Rhabdomyolysis: Secondary | ICD-10-CM | POA: Diagnosis not present

## 2019-03-21 DIAGNOSIS — Z8673 Personal history of transient ischemic attack (TIA), and cerebral infarction without residual deficits: Secondary | ICD-10-CM | POA: Diagnosis not present

## 2019-03-24 DIAGNOSIS — M069 Rheumatoid arthritis, unspecified: Secondary | ICD-10-CM | POA: Diagnosis not present

## 2019-03-24 DIAGNOSIS — M6281 Muscle weakness (generalized): Secondary | ICD-10-CM | POA: Diagnosis not present

## 2019-03-24 DIAGNOSIS — Z9181 History of falling: Secondary | ICD-10-CM | POA: Diagnosis not present

## 2019-03-24 DIAGNOSIS — I119 Hypertensive heart disease without heart failure: Secondary | ICD-10-CM | POA: Diagnosis not present

## 2019-03-27 DIAGNOSIS — Z8673 Personal history of transient ischemic attack (TIA), and cerebral infarction without residual deficits: Secondary | ICD-10-CM | POA: Diagnosis not present

## 2019-03-27 DIAGNOSIS — N182 Chronic kidney disease, stage 2 (mild): Secondary | ICD-10-CM | POA: Diagnosis not present

## 2019-03-27 DIAGNOSIS — I2510 Atherosclerotic heart disease of native coronary artery without angina pectoris: Secondary | ICD-10-CM | POA: Diagnosis not present

## 2019-03-27 DIAGNOSIS — M6281 Muscle weakness (generalized): Secondary | ICD-10-CM | POA: Diagnosis not present

## 2019-03-30 ENCOUNTER — Other Ambulatory Visit: Payer: Self-pay

## 2019-03-30 NOTE — Patient Outreach (Signed)
[facility]) Care Management  03/30/2019  Matthieu Loftus January 12, 1951 [id number]    EMMI-General Discharge RED ON EMMI ALERT Day # 1 Date: 03/29/2019 Red Alert Reason: " Know who to call about changes in condition? No  Wounds healing well? No  Other questions/problems? Yes"   Outreach attempt #1 to patient.Spoke with patient. He reports that the is doing okay since retuning home. Reviewed and addressed red alerts with patient. He denies having any wounds and reports error in response. He voices that he has been having some pain to right leg and wanted to know if he could take some Tylenol.Tylenol listed on patient's med list for prn usage and patient informed of so. He has not scheduled PCP appt. He reports he was not told to do so. RN CM discussed with patient importance of MD follow up. He voices that he has contact info and will call and make an appt. Advised patient that they would get one more automated EMMI-GENERAL post discharge calls to assess how they are doing following recent hospitalization and will receive a call from a nurse if any of their responses were abnormal. Patient voiced understanding and was appreciative of f/u call.    Plan: RN CM will close case as no further interventions needed at this time.   Antionette Fairy, RN,BSN,CCM [facility] Care Management Telephonic Care Management Coordinator Direct Phone: [phone] Toll Free: [phone] Fax: [phone]

## 2019-04-03 ENCOUNTER — Other Ambulatory Visit: Payer: Self-pay

## 2019-04-03 DIAGNOSIS — Z743 Need for continuous supervision: Secondary | ICD-10-CM | POA: Diagnosis not present

## 2019-04-03 DIAGNOSIS — F1020 Alcohol dependence, uncomplicated: Secondary | ICD-10-CM | POA: Diagnosis not present

## 2019-04-03 DIAGNOSIS — M6282 Rhabdomyolysis: Secondary | ICD-10-CM | POA: Diagnosis not present

## 2019-04-03 DIAGNOSIS — M6281 Muscle weakness (generalized): Secondary | ICD-10-CM | POA: Diagnosis not present

## 2019-04-03 DIAGNOSIS — I2510 Atherosclerotic heart disease of native coronary artery without angina pectoris: Secondary | ICD-10-CM | POA: Diagnosis not present

## 2019-04-03 DIAGNOSIS — I119 Hypertensive heart disease without heart failure: Secondary | ICD-10-CM | POA: Diagnosis not present

## 2019-04-03 DIAGNOSIS — N182 Chronic kidney disease, stage 2 (mild): Secondary | ICD-10-CM | POA: Diagnosis not present

## 2019-04-03 DIAGNOSIS — M069 Rheumatoid arthritis, unspecified: Secondary | ICD-10-CM | POA: Diagnosis not present

## 2019-04-03 DIAGNOSIS — Z8673 Personal history of transient ischemic attack (TIA), and cerebral infarction without residual deficits: Secondary | ICD-10-CM | POA: Diagnosis not present

## 2019-04-03 NOTE — Patient Outreach (Signed)
[facility]) Care Management  04/03/2019  Arin Peral 10-19-1950 [id number]    EMMI-General Discharge RED ON EMMI ALERT Day # 4 Date: 04/01/2019 Red Alert Reason: "Lost interest in things? Yes"   Outreach attempt # 1 to patient. No answer at present after several rings and unable to leave message.     Plan: RN CM will make outreach attempt to patient within 3-4 business days.   Antionette Fairy, RN,BSN,CCM [facility] Care Management Telephonic Care Management Coordinator Direct Phone: [phone] Toll Free: [phone] Fax: [phone]

## 2019-04-04 ENCOUNTER — Encounter: Payer: Self-pay | Admitting: Emergency Medicine

## 2019-04-04 ENCOUNTER — Emergency Department: Payer: Medicare HMO

## 2019-04-04 ENCOUNTER — Other Ambulatory Visit: Payer: Self-pay

## 2019-04-04 ENCOUNTER — Emergency Department
Admission: EM | Admit: 2019-04-04 | Discharge: 2019-04-04 | Disposition: A | Discharge status: Home / Self Care | Payer: Medicare HMO | Attending: Emergency Medicine | Admitting: Emergency Medicine

## 2019-04-04 DIAGNOSIS — Z8673 Personal history of transient ischemic attack (TIA), and cerebral infarction without residual deficits: Secondary | ICD-10-CM | POA: Diagnosis not present

## 2019-04-04 DIAGNOSIS — R0602 Shortness of breath: Secondary | ICD-10-CM | POA: Diagnosis not present

## 2019-04-04 DIAGNOSIS — N182 Chronic kidney disease, stage 2 (mild): Secondary | ICD-10-CM | POA: Diagnosis not present

## 2019-04-04 DIAGNOSIS — G8929 Other chronic pain: Secondary | ICD-10-CM | POA: Diagnosis not present

## 2019-04-04 DIAGNOSIS — Z7982 Long term (current) use of aspirin: Secondary | ICD-10-CM | POA: Insufficient documentation

## 2019-04-04 DIAGNOSIS — I10 Essential (primary) hypertension: Secondary | ICD-10-CM | POA: Diagnosis not present

## 2019-04-04 DIAGNOSIS — M79605 Pain in left leg: Secondary | ICD-10-CM | POA: Diagnosis not present

## 2019-04-04 DIAGNOSIS — M79604 Pain in right leg: Secondary | ICD-10-CM | POA: Diagnosis not present

## 2019-04-04 DIAGNOSIS — Z79899 Other long term (current) drug therapy: Secondary | ICD-10-CM | POA: Diagnosis not present

## 2019-04-04 DIAGNOSIS — I2510 Atherosclerotic heart disease of native coronary artery without angina pectoris: Secondary | ICD-10-CM | POA: Diagnosis not present

## 2019-04-04 DIAGNOSIS — R5381 Other malaise: Secondary | ICD-10-CM | POA: Diagnosis not present

## 2019-04-04 DIAGNOSIS — M6282 Rhabdomyolysis: Secondary | ICD-10-CM | POA: Diagnosis not present

## 2019-04-04 DIAGNOSIS — I119 Hypertensive heart disease without heart failure: Secondary | ICD-10-CM | POA: Diagnosis not present

## 2019-04-04 DIAGNOSIS — Z87891 Personal history of nicotine dependence: Secondary | ICD-10-CM | POA: Insufficient documentation

## 2019-04-04 DIAGNOSIS — M6281 Muscle weakness (generalized): Secondary | ICD-10-CM | POA: Diagnosis not present

## 2019-04-04 DIAGNOSIS — F1020 Alcohol dependence, uncomplicated: Secondary | ICD-10-CM | POA: Diagnosis not present

## 2019-04-04 DIAGNOSIS — W19XXXA Unspecified fall, initial encounter: Secondary | ICD-10-CM | POA: Diagnosis not present

## 2019-04-04 DIAGNOSIS — Z743 Need for continuous supervision: Secondary | ICD-10-CM | POA: Diagnosis not present

## 2019-04-04 DIAGNOSIS — M069 Rheumatoid arthritis, unspecified: Secondary | ICD-10-CM | POA: Diagnosis not present

## 2019-04-04 LAB — BASIC METABOLIC PANEL
Anion gap: 12 (ref 5–15)
BUN: 10 mg/dL (ref 8–23)
CO2: 26 mmol/L (ref 22–32)
Calcium: 9.5 mg/dL (ref 8.9–10.3)
Chloride: 100 mmol/L (ref 98–111)
Creatinine, Ser: 0.83 mg/dL (ref 0.61–1.24)
GFR calc Af Amer: >60 mL/min (ref >60)
GFR calc non Af Amer: >60 mL/min (ref >60)
Glucose, Bld: 81 mg/dL (ref 70–99)
Potassium: 3.4 mmol/L — ABNORMAL LOW (ref 3.5–5.1)
Sodium: 138 mmol/L (ref 135–145)

## 2019-04-04 LAB — CBC
HCT: 32.7 % — ABNORMAL LOW (ref 39.0–52.0)
Hemoglobin: 11 g/dL — ABNORMAL LOW (ref 13.0–17.0)
MCH: 30.4 pg (ref 26.0–34.0)
MCHC: 33.6 g/dL (ref 30.0–36.0)
MCV: 90.3 fL (ref 80.0–100.0)
Platelets: 218 10*3/uL (ref 150–400)
RBC: 3.62 MIL/uL — ABNORMAL LOW (ref 4.22–5.81)
RDW: 13.7 % (ref 11.5–15.5)
WBC: 8.2 10*3/uL (ref 4.0–10.5)
nRBC: 0 % (ref 0.0–0.2)

## 2019-04-04 LAB — TROPONIN I (HIGH SENSITIVITY)
Troponin I (High Sensitivity): 19 ng/L — ABNORMAL HIGH (ref <18)
Troponin I (High Sensitivity): 23 ng/L — ABNORMAL HIGH (ref <18)

## 2019-04-04 MED ORDER — HYDRALAZINE HCL 50 MG PO TABS: 50.0000 mg | ORAL_TABLET | Freq: Three times a day (TID) | ORAL | 0 refills | Status: DC

## 2019-04-04 MED ORDER — METOPROLOL TARTRATE 25 MG PO TABS
25.0000 mg | ORAL_TABLET | Freq: Once | ORAL | Status: AC
  Administered 2019-04-04: 19:00:00 25 mg via ORAL
  Filled 2019-04-04: qty 1

## 2019-04-04 MED ORDER — AMLODIPINE BESYLATE 10 MG PO TABS: 10.0000 mg | ORAL_TABLET | Freq: Every day | ORAL | 0 refills | Status: DC

## 2019-04-04 MED ORDER — HYDRALAZINE HCL 50 MG PO TABS
50.0000 mg | ORAL_TABLET | Freq: Once | ORAL | Status: AC
  Administered 2019-04-04: 50 mg via ORAL
  Filled 2019-04-04: qty 1

## 2019-04-04 MED ORDER — LORAZEPAM 2 MG PO TABS: 2.0000 mg | ORAL_TABLET | Freq: Once | ORAL | Status: DC

## 2019-04-04 MED ORDER — AMLODIPINE BESYLATE 5 MG PO TABS
10.0000 mg | ORAL_TABLET | Freq: Once | ORAL | Status: AC
  Administered 2019-04-04: 10 mg via ORAL
  Filled 2019-04-04: qty 2

## 2019-04-04 MED ORDER — LORAZEPAM 2 MG PO TABS
2.0000 mg | ORAL_TABLET | Freq: Once | ORAL | Status: AC
  Administered 2019-04-04: 2 mg via ORAL
  Filled 2019-04-04: qty 1

## 2019-04-04 MED ORDER — METOPROLOL SUCCINATE ER 25 MG PO TB24: 25.0000 mg | ORAL_TABLET | Freq: Every day | ORAL | 0 refills | Status: DC

## 2019-04-04 NOTE — Patient Outreach (Signed)
[facility]) Care Management  04/04/2019  Ayiden Milliman [birth year] [id number]   EMMI-General Discharge RED ON EMMI ALERT Day # 4 Date: 04/01/2019 Red Alert Reason: "Lost interest in things? Yes"   Outreach attempt #2 to patient. Spoke with patient who reports he is doing fairly well. He denies any acute issues or concerns at present. RN CM reviewed and addressed red alert with patient. He reports error in response. He denies any sxs. He states that [facility] nurse is coming today to see him. He has not yet made PCP follow up appt as his sister normally does that. He states he will have his sister call and make an appt. He denies any issues with transportation. Patient voices no further RN CM needs or concerns at this time. Patient has completed automated post discharge calls.    Plan: RN CM will close case at this time.   Antionette Fairy, RN,BSN,CCM [facility] Care Management Telephonic Care Management Coordinator Direct Phone: [phone] Toll Free: [phone] Fax: [phone]

## 2019-04-04 NOTE — ED Triage Notes (Signed)
FIRST NURSE NOTE- here for elevated bp.  Has been out of his meds for 2 weeks.  Home health came and bp was 200s over 90s.  Heavy drinker. Last drink last night.  Pt c/o leg pain.

## 2019-04-04 NOTE — Discharge Instructions (Signed)
Please be certain to take your blood pressure medications as prescribed, you will need follow-up with [facility] to evaluate your blood pressure and to decide whether you may need to go on a fluid pill as well.  Your blood pressure improved on these medications in the emergency department.  We would like your blood pressure to gradually improve over the next several days.

## 2019-04-04 NOTE — ED Notes (Signed)
Pt currently sleeping at this time.

## 2019-04-04 NOTE — ED Provider Notes (Signed)
[facility] Emergency Department Provider Note   ____________________________________________    I have reviewed the triage vital signs and the nursing notes.   HISTORY  Chief Complaint Hypertension and Leg Pain     HPI John Spears is a 69 y.o. male who presents with elevated blood pressure and chronic leg pain.  Patient reports his daughter called the ambulance because his blood pressure was elevated.  He is somewhat of a poor historian but does note pain in his right leg greater than his left.  Review of medical records demonstrates an admission for similar complaint approximately 1 month ago, was discharged with 3 antihypertensives.  Patient states he does not take any medications.  Denies chest pain, no shortness of breath.  Does admit that he drinks significant ounce of alcohol every night.  Feels mildly tremulous but does not think that he is withdrawing significantly.  Past Medical History:  Diagnosis Date  . Hypertension     Patient Active Problem List   Diagnosis Date Noted  . Hypomagnesemia   . Hypokalemia   . Transaminitis   . Traumatic rhabdomyolysis (HCC)   . Thrombocytopenia (HCC)   . Weakness   . Lower extremity weakness 03/07/2019  . Acute CVA (cerebrovascular accident) (HCC) 04/05/2016    Past Surgical History:  Procedure Laterality Date  . JOINT REPLACEMENT      Prior to Admission medications   Medication Sig Start Date End Date Taking? Authorizing Provider  acetaminophen (TYLENOL) 325 MG tablet Take 2 tablets (650 mg total) by mouth every 6 (six) hours as needed for mild pain or fever (or Fever >/= 101). 03/14/19   Charise Killian, MD  amLODipine (NORVASC) 10 MG tablet Take 1 tablet (10 mg total) by mouth daily. 04/04/19 05/04/19  Jene Every, MD  aspirin EC 81 MG EC tablet Take 1 tablet (81 mg total) by mouth daily. 03/15/19   Charise Killian, MD  hydrALAZINE (APRESOLINE) 50 MG tablet Take 1 tablet (50 mg total)  by mouth every 8 (eight) hours. 04/04/19 05/04/19  Jene Every, MD  metoprolol succinate (TOPROL-XL) 25 MG 24 hr tablet Take 1 tablet (25 mg total) by mouth daily. 04/04/19 05/04/19  Jene Every, MD     Allergies Patient has no known allergies.  No family history on file.  Social History Social History   Tobacco Use  . Smoking status: Former Games developer  . Smokeless tobacco: Never Used  Substance Use Topics  . Alcohol use: Yes    Alcohol/week: 28.0 standard drinks    Types: 28 Cans of beer per week    Comment: drinks 1/5 per week or more  . Drug use: No    Review of Systems  Constitutional: No fever/chills Eyes: No visual changes.  ENT: No sore throat. Cardiovascular: Denies chest pain. Respiratory: Denies shortness of breath. Gastrointestinal: No abdominal pain.   Genitourinary: Negative for dysuria. Musculoskeletal: Right greater than left leg pain, consistent with recent admission Skin: Negative for rash. Neurological: Negative for headaches    ____________________________________________   PHYSICAL EXAM:  VITAL SIGNS: ED Triage Vitals  Enc Vitals Group     BP 04/04/19 1623 (!) 215/92     Pulse Rate 04/04/19 1623 100     Resp 04/04/19 1623 16     Temp 04/04/19 1623 98.4 F (36.9 C)     Temp Source 04/04/19 1623 Oral     SpO2 04/04/19 1623 98 %     Weight 04/04/19 1624 81.6 kg (180  lb)     Height 04/04/19 1624 1.803 m (5\' 11" )     Head Circumference --      Peak Flow --      Pain Score 04/04/19 1624 5     Pain Loc --      Pain Edu? --      Excl. in [place]? --     Constitutional: Alert and oriented.   Nose: No congestion/rhinnorhea. Mouth/Throat: Mucous membranes are moist.   Neck:  Painless ROM Cardiovascular: Normal rate, regular rhythm. Grossly normal heart sounds.  Good peripheral circulation. Respiratory: Normal respiratory effort.  No retractions. Lungs CTAB. Gastrointestinal: Soft and nontender. No distention.  No CVA tenderness. Genitourinary:  deferred Musculoskeletal: 1+ edema bilateral lower extremities, no calf tenderness palpation.  Warm and well perfused Neurologic:  Normal speech and language. No gross focal neurologic deficits are appreciated.  Skin:  Skin is warm, dry and intact. No rash noted. Psychiatric: Mood and affect are normal. Speech and behavior are normal.  ____________________________________________   LABS (all labs ordered are listed, but only abnormal results are displayed)  Labs Reviewed  BASIC METABOLIC PANEL - Abnormal; Notable for the following components:      Result Value   Potassium 3.4 (*)    All other components within normal limits  CBC - Abnormal; Notable for the following components:   RBC 3.62 (*)    Hemoglobin 11.0 (*)    HCT 32.7 (*)    All other components within normal limits  TROPONIN I (HIGH SENSITIVITY) - Abnormal; Notable for the following components:   Troponin I (High Sensitivity) 19 (*)    All other components within normal limits  TROPONIN I (HIGH SENSITIVITY) - Abnormal; Notable for the following components:   Troponin I (High Sensitivity) 23 (*)    All other components within normal limits   ____________________________________________  EKG  ED ECG REPORT I, Lavonia Drafts, the attending physician, personally viewed and interpreted this ECG.  Date: 04/04/2019  Rhythm: normal sinus rhythm QRS Axis: normal Intervals: normal ST/T Wave abnormalities: Nonspecific changes Narrative Interpretation: no evidence of acute ischemia  ____________________________________________  RADIOLOGY  Chest x-ray unremarkable ____________________________________________   PROCEDURES  Procedure(s) performed: No  Procedures   Critical Care performed: No ____________________________________________   INITIAL IMPRESSION / ASSESSMENT AND PLAN / ED COURSE  Pertinent labs & imaging results that were available during my care of the patient were reviewed by me and considered in  my medical decision making (see chart for details).  Patient presents with vague complaints of elevated blood pressure and appears to be chronic right greater than left leg pain.  Review of medical records demonstrates similar complaints 1 month ago.  Exam is overall reassuring, suspect his discomfort is related to edema in the legs, he states he does not take any medications, we will restart the blood pressure medications that he was discharged with during last admission  Patient's blood pressure improving, he is comfortable and delta troponin not significantly changed.  Appropriate for discharge at this time, emphasized outpatient follow-up and medication compliance    ____________________________________________   FINAL CLINICAL IMPRESSION(S) / ED DIAGNOSES  Final diagnoses:  Hypertension, unspecified type        Note:  This document was prepared using Dragon voice recognition software and may include unintentional dictation errors.   Lavonia Drafts, MD 04/04/19 [phone]

## 2019-04-04 NOTE — ED Triage Notes (Signed)
Patient reports he was brought by EMS for leg pain that has been intermittent for the last month. Patient's BP high for EMS. Per patient, he does not currently take anything for his BP.

## 2019-04-04 NOTE — ED Notes (Signed)
Pt's sister informed pt is going to be discharged, states she will be here to pick him up shortly

## 2019-04-06 DIAGNOSIS — Z8673 Personal history of transient ischemic attack (TIA), and cerebral infarction without residual deficits: Secondary | ICD-10-CM | POA: Diagnosis not present

## 2019-04-06 DIAGNOSIS — M6281 Muscle weakness (generalized): Secondary | ICD-10-CM | POA: Diagnosis not present

## 2019-04-06 DIAGNOSIS — M069 Rheumatoid arthritis, unspecified: Secondary | ICD-10-CM | POA: Diagnosis not present

## 2019-04-06 DIAGNOSIS — Z743 Need for continuous supervision: Secondary | ICD-10-CM | POA: Diagnosis not present

## 2019-04-06 DIAGNOSIS — F1020 Alcohol dependence, uncomplicated: Secondary | ICD-10-CM | POA: Diagnosis not present

## 2019-04-06 DIAGNOSIS — M6282 Rhabdomyolysis: Secondary | ICD-10-CM | POA: Diagnosis not present

## 2019-04-06 DIAGNOSIS — I119 Hypertensive heart disease without heart failure: Secondary | ICD-10-CM | POA: Diagnosis not present

## 2019-04-06 DIAGNOSIS — I2510 Atherosclerotic heart disease of native coronary artery without angina pectoris: Secondary | ICD-10-CM | POA: Diagnosis not present

## 2019-04-06 DIAGNOSIS — N182 Chronic kidney disease, stage 2 (mild): Secondary | ICD-10-CM | POA: Diagnosis not present

## 2019-04-11 DIAGNOSIS — I2510 Atherosclerotic heart disease of native coronary artery without angina pectoris: Secondary | ICD-10-CM | POA: Diagnosis not present

## 2019-04-11 DIAGNOSIS — Z743 Need for continuous supervision: Secondary | ICD-10-CM | POA: Diagnosis not present

## 2019-04-11 DIAGNOSIS — M6281 Muscle weakness (generalized): Secondary | ICD-10-CM | POA: Diagnosis not present

## 2019-04-11 DIAGNOSIS — Z8673 Personal history of transient ischemic attack (TIA), and cerebral infarction without residual deficits: Secondary | ICD-10-CM | POA: Diagnosis not present

## 2019-04-11 DIAGNOSIS — F1020 Alcohol dependence, uncomplicated: Secondary | ICD-10-CM | POA: Diagnosis not present

## 2019-04-11 DIAGNOSIS — N182 Chronic kidney disease, stage 2 (mild): Secondary | ICD-10-CM | POA: Diagnosis not present

## 2019-04-11 DIAGNOSIS — M069 Rheumatoid arthritis, unspecified: Secondary | ICD-10-CM | POA: Diagnosis not present

## 2019-04-11 DIAGNOSIS — I119 Hypertensive heart disease without heart failure: Secondary | ICD-10-CM | POA: Diagnosis not present

## 2019-04-11 DIAGNOSIS — M6282 Rhabdomyolysis: Secondary | ICD-10-CM | POA: Diagnosis not present

## 2019-04-12 DIAGNOSIS — Z8673 Personal history of transient ischemic attack (TIA), and cerebral infarction without residual deficits: Secondary | ICD-10-CM | POA: Diagnosis not present

## 2019-04-12 DIAGNOSIS — Z743 Need for continuous supervision: Secondary | ICD-10-CM | POA: Diagnosis not present

## 2019-04-12 DIAGNOSIS — M6281 Muscle weakness (generalized): Secondary | ICD-10-CM | POA: Diagnosis not present

## 2019-04-12 DIAGNOSIS — I2510 Atherosclerotic heart disease of native coronary artery without angina pectoris: Secondary | ICD-10-CM | POA: Diagnosis not present

## 2019-04-12 DIAGNOSIS — I119 Hypertensive heart disease without heart failure: Secondary | ICD-10-CM | POA: Diagnosis not present

## 2019-04-12 DIAGNOSIS — M6282 Rhabdomyolysis: Secondary | ICD-10-CM | POA: Diagnosis not present

## 2019-04-12 DIAGNOSIS — M069 Rheumatoid arthritis, unspecified: Secondary | ICD-10-CM | POA: Diagnosis not present

## 2019-04-12 DIAGNOSIS — F1020 Alcohol dependence, uncomplicated: Secondary | ICD-10-CM | POA: Diagnosis not present

## 2019-04-12 DIAGNOSIS — N182 Chronic kidney disease, stage 2 (mild): Secondary | ICD-10-CM | POA: Diagnosis not present

## 2019-04-14 DIAGNOSIS — M069 Rheumatoid arthritis, unspecified: Secondary | ICD-10-CM | POA: Diagnosis not present

## 2019-04-14 DIAGNOSIS — Z743 Need for continuous supervision: Secondary | ICD-10-CM | POA: Diagnosis not present

## 2019-04-14 DIAGNOSIS — F1020 Alcohol dependence, uncomplicated: Secondary | ICD-10-CM | POA: Diagnosis not present

## 2019-04-14 DIAGNOSIS — I119 Hypertensive heart disease without heart failure: Secondary | ICD-10-CM | POA: Diagnosis not present

## 2019-04-14 DIAGNOSIS — Z8673 Personal history of transient ischemic attack (TIA), and cerebral infarction without residual deficits: Secondary | ICD-10-CM | POA: Diagnosis not present

## 2019-04-14 DIAGNOSIS — N182 Chronic kidney disease, stage 2 (mild): Secondary | ICD-10-CM | POA: Diagnosis not present

## 2019-04-14 DIAGNOSIS — I2510 Atherosclerotic heart disease of native coronary artery without angina pectoris: Secondary | ICD-10-CM | POA: Diagnosis not present

## 2019-04-14 DIAGNOSIS — M6282 Rhabdomyolysis: Secondary | ICD-10-CM | POA: Diagnosis not present

## 2019-04-14 DIAGNOSIS — M6281 Muscle weakness (generalized): Secondary | ICD-10-CM | POA: Diagnosis not present

## 2019-04-17 DIAGNOSIS — M069 Rheumatoid arthritis, unspecified: Secondary | ICD-10-CM | POA: Diagnosis not present

## 2019-04-17 DIAGNOSIS — F1020 Alcohol dependence, uncomplicated: Secondary | ICD-10-CM | POA: Diagnosis not present

## 2019-04-17 DIAGNOSIS — Z743 Need for continuous supervision: Secondary | ICD-10-CM | POA: Diagnosis not present

## 2019-04-17 DIAGNOSIS — M6282 Rhabdomyolysis: Secondary | ICD-10-CM | POA: Diagnosis not present

## 2019-04-17 DIAGNOSIS — N182 Chronic kidney disease, stage 2 (mild): Secondary | ICD-10-CM | POA: Diagnosis not present

## 2019-04-17 DIAGNOSIS — M6281 Muscle weakness (generalized): Secondary | ICD-10-CM | POA: Diagnosis not present

## 2019-04-17 DIAGNOSIS — Z8673 Personal history of transient ischemic attack (TIA), and cerebral infarction without residual deficits: Secondary | ICD-10-CM | POA: Diagnosis not present

## 2019-04-17 DIAGNOSIS — I119 Hypertensive heart disease without heart failure: Secondary | ICD-10-CM | POA: Diagnosis not present

## 2019-04-17 DIAGNOSIS — I2510 Atherosclerotic heart disease of native coronary artery without angina pectoris: Secondary | ICD-10-CM | POA: Diagnosis not present

## 2019-04-18 DIAGNOSIS — L309 Dermatitis, unspecified: Secondary | ICD-10-CM | POA: Diagnosis not present

## 2019-04-18 DIAGNOSIS — F1020 Alcohol dependence, uncomplicated: Secondary | ICD-10-CM | POA: Diagnosis not present

## 2019-04-18 DIAGNOSIS — G629 Polyneuropathy, unspecified: Secondary | ICD-10-CM | POA: Diagnosis not present

## 2019-04-18 DIAGNOSIS — Z1389 Encounter for screening for other disorder: Secondary | ICD-10-CM | POA: Diagnosis not present

## 2019-04-18 DIAGNOSIS — I10 Essential (primary) hypertension: Secondary | ICD-10-CM | POA: Diagnosis not present

## 2019-04-18 DIAGNOSIS — E876 Hypokalemia: Secondary | ICD-10-CM | POA: Diagnosis not present

## 2019-04-19 DIAGNOSIS — M6282 Rhabdomyolysis: Secondary | ICD-10-CM | POA: Diagnosis not present

## 2019-04-19 DIAGNOSIS — M6281 Muscle weakness (generalized): Secondary | ICD-10-CM | POA: Diagnosis not present

## 2019-04-19 DIAGNOSIS — Z8673 Personal history of transient ischemic attack (TIA), and cerebral infarction without residual deficits: Secondary | ICD-10-CM | POA: Diagnosis not present

## 2019-04-19 DIAGNOSIS — Z743 Need for continuous supervision: Secondary | ICD-10-CM | POA: Diagnosis not present

## 2019-04-19 DIAGNOSIS — F1020 Alcohol dependence, uncomplicated: Secondary | ICD-10-CM | POA: Diagnosis not present

## 2019-04-19 DIAGNOSIS — I119 Hypertensive heart disease without heart failure: Secondary | ICD-10-CM | POA: Diagnosis not present

## 2019-04-19 DIAGNOSIS — N182 Chronic kidney disease, stage 2 (mild): Secondary | ICD-10-CM | POA: Diagnosis not present

## 2019-04-19 DIAGNOSIS — I2510 Atherosclerotic heart disease of native coronary artery without angina pectoris: Secondary | ICD-10-CM | POA: Diagnosis not present

## 2019-04-19 DIAGNOSIS — M069 Rheumatoid arthritis, unspecified: Secondary | ICD-10-CM | POA: Diagnosis not present

## 2019-04-20 DIAGNOSIS — M6282 Rhabdomyolysis: Secondary | ICD-10-CM | POA: Diagnosis not present

## 2019-04-20 DIAGNOSIS — Z743 Need for continuous supervision: Secondary | ICD-10-CM | POA: Diagnosis not present

## 2019-04-20 DIAGNOSIS — F1020 Alcohol dependence, uncomplicated: Secondary | ICD-10-CM | POA: Diagnosis not present

## 2019-04-20 DIAGNOSIS — Z8673 Personal history of transient ischemic attack (TIA), and cerebral infarction without residual deficits: Secondary | ICD-10-CM | POA: Diagnosis not present

## 2019-04-20 DIAGNOSIS — I2510 Atherosclerotic heart disease of native coronary artery without angina pectoris: Secondary | ICD-10-CM | POA: Diagnosis not present

## 2019-04-20 DIAGNOSIS — N182 Chronic kidney disease, stage 2 (mild): Secondary | ICD-10-CM | POA: Diagnosis not present

## 2019-04-20 DIAGNOSIS — M069 Rheumatoid arthritis, unspecified: Secondary | ICD-10-CM | POA: Diagnosis not present

## 2019-04-20 DIAGNOSIS — M6281 Muscle weakness (generalized): Secondary | ICD-10-CM | POA: Diagnosis not present

## 2019-04-20 DIAGNOSIS — I119 Hypertensive heart disease without heart failure: Secondary | ICD-10-CM | POA: Diagnosis not present

## 2020-04-09 ENCOUNTER — Ambulatory Visit: Payer: Self-pay

## 2020-10-02 ENCOUNTER — Emergency Department (HOSPITAL_COMMUNITY): Payer: Medicare Other

## 2020-10-02 ENCOUNTER — Emergency Department (HOSPITAL_COMMUNITY)
Admission: EM | Admit: 2020-10-02 | Discharge: 2020-10-03 | Disposition: A | Discharge status: Home / Self Care | Payer: Medicare Other | Attending: Emergency Medicine | Admitting: Emergency Medicine

## 2020-10-02 ENCOUNTER — Other Ambulatory Visit: Payer: Self-pay

## 2020-10-02 DIAGNOSIS — R791 Abnormal coagulation profile: Secondary | ICD-10-CM | POA: Diagnosis not present

## 2020-10-02 DIAGNOSIS — Z20822 Contact with and (suspected) exposure to covid-19: Secondary | ICD-10-CM | POA: Diagnosis not present

## 2020-10-02 DIAGNOSIS — R4182 Altered mental status, unspecified: Secondary | ICD-10-CM | POA: Diagnosis present

## 2020-10-02 DIAGNOSIS — F10921 Alcohol use, unspecified with intoxication delirium: Secondary | ICD-10-CM

## 2020-10-02 DIAGNOSIS — Z7982 Long term (current) use of aspirin: Secondary | ICD-10-CM | POA: Diagnosis not present

## 2020-10-02 DIAGNOSIS — G9340 Encephalopathy, unspecified: Secondary | ICD-10-CM | POA: Diagnosis not present

## 2020-10-02 DIAGNOSIS — I10 Essential (primary) hypertension: Secondary | ICD-10-CM | POA: Insufficient documentation

## 2020-10-02 DIAGNOSIS — F10920 Alcohol use, unspecified with intoxication, uncomplicated: Secondary | ICD-10-CM

## 2020-10-02 DIAGNOSIS — Z87891 Personal history of nicotine dependence: Secondary | ICD-10-CM | POA: Insufficient documentation

## 2020-10-02 DIAGNOSIS — Y908 Blood alcohol level of 240 mg/100 ml or more: Secondary | ICD-10-CM | POA: Insufficient documentation

## 2020-10-02 DIAGNOSIS — F10120 Alcohol abuse with intoxication, uncomplicated: Secondary | ICD-10-CM | POA: Insufficient documentation

## 2020-10-02 DIAGNOSIS — Z79899 Other long term (current) drug therapy: Secondary | ICD-10-CM | POA: Diagnosis not present

## 2020-10-02 LAB — DIFFERENTIAL
Abs Immature Granulocytes: 0.01 10*3/uL (ref 0.00–0.07)
Basophils Absolute: 0.1 10*3/uL (ref 0.0–0.1)
Basophils Relative: 2 %
Eosinophils Absolute: 0 10*3/uL (ref 0.0–0.5)
Eosinophils Relative: 1 %
Immature Granulocytes: 0 %
Lymphocytes Relative: 50 %
Lymphs Abs: 2.1 10*3/uL (ref 0.7–4.0)
Monocytes Absolute: 0.4 10*3/uL (ref 0.1–1.0)
Monocytes Relative: 11 %
Neutro Abs: 1.5 10*3/uL — ABNORMAL LOW (ref 1.7–7.7)
Neutrophils Relative %: 36 %

## 2020-10-02 LAB — I-STAT CHEM 8, ED
BUN: 13 mg/dL (ref 8–23)
Calcium, Ion: 0.98 mmol/L — ABNORMAL LOW (ref 1.15–1.40)
Chloride: 106 mmol/L (ref 98–111)
Creatinine, Ser: 1.3 mg/dL — ABNORMAL HIGH (ref 0.61–1.24)
Glucose, Bld: 87 mg/dL (ref 70–99)
HCT: 40 % (ref 39.0–52.0)
Hemoglobin: 13.6 g/dL (ref 13.0–17.0)
Potassium: 3.6 mmol/L (ref 3.5–5.1)
Sodium: 142 mmol/L (ref 135–145)
TCO2: 23 mmol/L (ref 22–32)

## 2020-10-02 LAB — CBC
HCT: 35.1 % — ABNORMAL LOW (ref 39.0–52.0)
Hemoglobin: 11.9 g/dL — ABNORMAL LOW (ref 13.0–17.0)
MCH: 32.3 pg (ref 26.0–34.0)
MCHC: 33.9 g/dL (ref 30.0–36.0)
MCV: 95.4 fL (ref 80.0–100.0)
Platelets: 134 10*3/uL — ABNORMAL LOW (ref 150–400)
RBC: 3.68 MIL/uL — ABNORMAL LOW (ref 4.22–5.81)
RDW: 15 % (ref 11.5–15.5)
WBC: 4.1 10*3/uL (ref 4.0–10.5)
nRBC: 0 % (ref 0.0–0.2)

## 2020-10-02 LAB — COMPREHENSIVE METABOLIC PANEL
ALT: 24 U/L (ref 0–44)
AST: 62 U/L — ABNORMAL HIGH (ref 15–41)
Albumin: 3.5 g/dL (ref 3.5–5.0)
Alkaline Phosphatase: 58 U/L (ref 38–126)
Anion gap: 11 (ref 5–15)
BUN: 11 mg/dL (ref 8–23)
CO2: 22 mmol/L (ref 22–32)
Calcium: 8.5 mg/dL — ABNORMAL LOW (ref 8.9–10.3)
Chloride: 107 mmol/L (ref 98–111)
Creatinine, Ser: 0.94 mg/dL (ref 0.61–1.24)
GFR, Estimated: >60 mL/min (ref >60)
Glucose, Bld: 88 mg/dL (ref 70–99)
Potassium: 3.3 mmol/L — ABNORMAL LOW (ref 3.5–5.1)
Sodium: 140 mmol/L (ref 135–145)
Total Bilirubin: 0.4 mg/dL (ref 0.3–1.2)
Total Protein: 6.3 g/dL — ABNORMAL LOW (ref 6.5–8.1)

## 2020-10-02 LAB — RESP PANEL BY RT-PCR (FLU A&B, COVID) ARPGX2
Influenza A by PCR: NEGATIVE
Influenza B by PCR: NEGATIVE
SARS Coronavirus 2 by RT PCR: NEGATIVE

## 2020-10-02 LAB — PROTIME-INR
INR: 1 (ref 0.8–1.2)
Prothrombin Time: 13.3 seconds (ref 11.4–15.2)

## 2020-10-02 LAB — APTT: aPTT: 29 seconds (ref 24–36)

## 2020-10-02 LAB — CBG MONITORING, ED: Glucose-Capillary: 86 mg/dL (ref 70–99)

## 2020-10-02 LAB — ETHANOL: Alcohol, Ethyl (B): 361 mg/dL (ref <10)

## 2020-10-02 MED ORDER — LABETALOL HCL 5 MG/ML IV SOLN
20.0000 mg | Freq: Once | INTRAVENOUS | Status: AC
  Administered 2020-10-02: 20 mg via INTRAVENOUS

## 2020-10-02 MED ORDER — LORAZEPAM 2 MG/ML IJ SOLN
INTRAMUSCULAR | Status: AC
  Administered 2020-10-02: 1 mg
  Filled 2020-10-02: qty 1

## 2020-10-02 MED ORDER — LORAZEPAM 1 MG PO TABS: 0.0000 mg | ORAL_TABLET | Freq: Two times a day (BID) | ORAL | Status: DC

## 2020-10-02 MED ORDER — THIAMINE HCL 100 MG PO TABS: 100.0000 mg | ORAL_TABLET | Freq: Every day | ORAL | Status: DC

## 2020-10-02 MED ORDER — SODIUM CHLORIDE 0.9 % IV BOLUS
500.0000 mL | Freq: Once | INTRAVENOUS | Status: AC
  Administered 2020-10-02: 500 mL via INTRAVENOUS

## 2020-10-02 MED ORDER — LORAZEPAM 1 MG PO TABS: 1.0000 mg | ORAL_TABLET | Freq: Once | ORAL | Status: DC

## 2020-10-02 MED ORDER — THIAMINE HCL 100 MG/ML IJ SOLN
100.0000 mg | Freq: Every day | INTRAMUSCULAR | Status: DC
  Administered 2020-10-02: 100 mg via INTRAVENOUS
  Filled 2020-10-02: qty 2

## 2020-10-02 MED ORDER — LORAZEPAM 2 MG/ML IJ SOLN: 0.0000 mg | Freq: Two times a day (BID) | INTRAMUSCULAR | Status: DC

## 2020-10-02 MED ORDER — LORAZEPAM 2 MG/ML IJ SOLN: 1.0000 mg | Freq: Once | INTRAMUSCULAR | Status: DC

## 2020-10-02 MED ORDER — LABETALOL HCL 5 MG/ML IV SOLN
20.0000 mg | Freq: Once | INTRAVENOUS | Status: DC
  Filled 2020-10-02: qty 4

## 2020-10-02 MED ORDER — LORAZEPAM 1 MG PO TABS: 0.0000 mg | ORAL_TABLET | Freq: Four times a day (QID) | ORAL | Status: DC

## 2020-10-02 MED ORDER — IOHEXOL 350 MG/ML SOLN
50.0000 mL | Freq: Once | INTRAVENOUS | Status: AC | PRN
  Administered 2020-10-02: 50 mL via INTRAVENOUS

## 2020-10-02 NOTE — Consult Note (Addendum)
Neurology consult   CC: code stroke.  History is obtained from: EMS.  HPI: MR. Schwede is a 70 yo male with a PMHx of ETOH abuse, RA, CAD, CKD, CdHF, HLD, prior stroke 2018, and HTN. EMS was called for respiratory distress, but when they arrived, the patient was not moving his extremities nor following commands. Global aphasia noted. Friend was driving patient today and patient began having respiratory distress in the car. Friend pulled into parking lot and called 911. Patient was last seen normal by his friend at 1300 hours. Per EMS, friend was not a good historian.   After brief exam on the ED bridge and for airway clearance, patient was taken emergently to CT suite. CTH showed no acute infarct. Awaiting read on CTA head and neck. Due to patient's presentation not consistent with stoke, no tPA was given. NP was suspicious for ETOH use and/or illicit drug use. CTA head and neck without significant stenosis or LVO.   In review of chart, last visit to [facility] was 03/2019 for hypertensive with medication non adherence. He was d/c from ED after restarting his home anti hypertensives.  Prior visit in 02/2019 for a fall with back and leg pain. In 03/2016, patient presented with left facial numbness and LEU numbness and MRI brain showed right thalamic stroke. He was placed on ASA 325mg  for secondary stroke prevention. NP does not see any f/up notes with neurology.   Patient's ethanol level resulted at 361. Exam and negative results of imaging consistent with AMS secondary to ETOH abuse.   We were concerned for seizure due to patient's AMS, but after Ethanol level resulted, EEG was cancelled.   LKW: 1300   hours tpa given?: No, low suspicion for stroke.  IR Thrombectomy?: No, no  LVO.  MRS: unknown due to AMS.   NIHSS:  1a Level of Consciousness: 2 1b LOC Questions: 2 1c LOC Commands: 2 2 Best Gaze: 1 3 Visual: 0 4 Facial Palsy: 0 5a Motor Arm - left: 4 5b Motor Arm - Right: 4 6a Motor Leg - Left:  4 6b Motor Leg - Right: 4 7 Limb Ataxia: 0 8 Sensory: 2 9 Best Language: 3 10 Dysarthria: 2 11 Extinction and Inattention: 0 TOTAL:  27  ROS: A robust ROS was unable to be performed due to emergent nature of event and patient's mental status.   Past Medical History:  Diagnosis Date   Hypertension   HLD, CKD, stroke, ETOH abuse, RA, CAD, CdHF  No family history on file.  Social History: can not assess recent history due to mental status, but old history states ETOH qd, about 28 drinks per week. Remote tobacco use. Older UDS positive for THC.   Prior to Admission medications   Medication Sig Start Date End Date Taking? Authorizing Provider  acetaminophen (TYLENOL) 325 MG tablet Take 2 tablets (650 mg total) by mouth every 6 (six) hours as needed for mild pain or fever (or Fever >/= 101). 03/14/19   Charise Killian, MD  amLODipine (NORVASC) 10 MG tablet Take 1 tablet (10 mg total) by mouth daily. 04/04/19 05/04/19  Jene Every, MD  aspirin EC 81 MG EC tablet Take 1 tablet (81 mg total) by mouth daily. 03/15/19   Charise Killian, MD  hydrALAZINE (APRESOLINE) 50 MG tablet Take 1 tablet (50 mg total) by mouth every 8 (eight) hours. 04/04/19 05/04/19  Jene Every, MD  hydrochlorothiazide (HYDRODIURIL) 25 MG tablet Take 25 mg by mouth daily. 04/08/20  [provider]  metoprolol succinate (TOPROL-XL) 25 MG 24 hr tablet Take 1 tablet (25 mg total) by mouth daily. 04/04/19 05/04/19  Jene Every, MD  potassium chloride (KLOR-CON) 10 MEQ tablet Take 10 mEq by mouth daily. 06/05/20   [provider]    Exam: Current vital signs: Wt 85.4 kg   BMI 26.26 kg/m  BP in 190s.   Physical Exam  Constitutional: Obese male. Appears well-developed and well-nourished.  Psych: Affect appropriate to situation. Teary eyed at times.  Eyes: No scleral injection. HENT: No OP obstruction. Head: Normocephalic.  Cardiovascular: Normal rate and regular rhythm.  Respiratory: Effort  normal.  GI: Abdomen soft.  No distension. There is no tenderness.  Skin: WDI  Neuro: Mental Status: Patient awakens after awhile and keeps his eyes open. Followed command for left hand squeeze only. Orientation unable to assess due to aphasia.  Patient is un able to give a clear and coherent history. No signs of neglect. Left gaze preference at first, but overcomes.  Speech/Language:  Mute.  Cranial Nerves: II: Pupils are equal, round, and reactive to light.  III,IV, VI: EOMI without ptosis or diploplia. Blinks to threat OU.    V, VII: Face is grossly symmetric. Will not smile for assessment. VIII: hearing is intact to loud voice. X, XI, XII: will not open mouth or follow commands.  Motor: LUE with some effort against gravity for grip after back in ED room.  No other motor assessment completed due to not following all commands.  Sensory: He does not move any extremity to noxious stimuli.   Cerebellar: Will not perform ataxic exam. No tremors, shaking, or clonus noted.   I have reviewed labs in epic and the pertinent results are: INR  1.   aPTT   29.    UDS pending.   Ethanol level 361.   MD reviewed the images obtained:  NCT head  -No acute abnormality -ASPECTS is 10 -Atrophy and chronic microvascular ischemic change in the white matter. No change from prior studies.  MRI brain Preliminary review of DWI images negative for stroke.   CTA head and neck -No significant stenosis or LVO.   Assessment: 70 yo male who came in as a code stroke due to not following commands, aphasia, and no movement noted in extremities. Stroke risk factors include prior stroke, HLD, HTN, and CAD. CTH and CTA head and neck are negative for acute finding. No tPA was given due to low suspicion for stroke. IR not warranted due to no LVO. Seizure was in the immediate differential, but after Ethanol level resulted at 361, EEG was cancelled. MRI brain preliminary read is negative for stroke. His  encephalopathic presentation is likely secondary to ETOH intoxication.   Impression:  -Encephalopathy likely secondary to ETOH Intoxication.  -Hypertension.  -History of non adherence to medicines and medical regimen.   Plan: -At this time, supportive care per the emergency room -Less likely that he is actively seizing with that amount of alcohol level. -If he does not improve over the next few hours, admit him for further observation and obtain an EEG in the morning. -No need for antiepileptics -I do not think he has very high risk for withdrawal at this level but if he gets admitted, watch for signs of withdrawal from alcohol. -From a hypertensive encephalopathy standpoint, I would bring his blood pressures down by 20% to a goal of around 160 or below. -If admitted, will need Case Mngt/SW for medication non adherence. -Consider  ETOH rehab.   Patient seen by Jimmye Norman, MSN, APN-BC, nurse practitioner and by MD. Note/plan to be edited by MD as needed.  Pager: [phone]     Attending addendum Patient seen and examined as an acute code stroke in the emergency department. Agree with the history and physical above. Imaging independently reviewed Requesting provider-Dr. Anitra Lauth Patient brought in by [facility].  CC/HPI: Mr. Dault is a 70 year old man who has a past medical history of hypertension, alcoholism, reportedly in his usual state of health at 1 PM today and was noted to have sudden onset of unresponsiveness.  EMS was called in a parking lot where he was in a car with a friend-for assessment because he was short of breath but they also noticed that in addition to being unresponsive he had a questionable right-sided facial droop.  Due to the sudden onset of unresponsiveness and question of right facial droop, they activated a code stroke and brought him to [facility]. He was seen and evaluated at the bridge emergently. Review of systems unable to perform due  to his mentation  Objective: On my examination: See vital started above. General: Well-developed well-nourished man in no apparent distress HEENT: Normocephalic/atraumatic CVs: Regular rate rhythm-hypertensive to systolic 200. Respiratory: Breathing well and saturating normally on room air at about 96% Abdomen nondistended nontender Neurological exam He appears obtunded Does not open eyes to voice or noxious stimulation Does not follow commands Cranial nerves: Pupils equal round reactive light, extraocular movements are difficult to assess but has positive oculocephalics, has positive corneals bilaterally, does not blink to threat from either side, on my examination the nasolabial folds appeared symmetric. Motor examination: No spontaneous movement.  No movement to noxious stimulation in any of the 4 extremities. Sensory exam: As above Coordination and gait cannot be assessed due to his mentation NIH stroke scale-1700 hrs 1a Level of Conscious.: 2 1b LOC Questions: 2 1c LOC Commands: 2 2 Best Gaze: 0 3 Visual: 0 4 Facial Palsy:0  5a Motor Arm - left:4 5b Motor Arm - Right: 4 6a Motor Leg - Left: 4 6b Motor Leg - Right: 4 7 Limb Ataxia: 0 8 Sensory: 0 9 Best Language: 3 10 Dysarthria: 2 11 Extinct. and Inatten.: 0 TOTAL: 27  Imaging personally reviewed: Stat CT head with no acute changes.  Aspects 10. CT angio head and neck: No significant stenosis or large vessel occlusion Taken for stat MRI: Preliminary review of DWI images negative for stroke  Labs resulted after his MRI was completed BMP with mild hypokalemia and mild hypocalcemia.  CBC with mild anemia. Serum ethanol level: 361.  Assessment  70 year old with above past medical history presented with sudden onset of unresponsiveness and questionable right facial droop. On my examination, face looks symmetric. He was obtunded and nonfocal on the exam. Concern for posterior circulation stroke given presentation and  sudden onset. CT head and stat CT angiogram head and neck unremarkable for LVO.  Posterior circulation arteries patent. Stat MRI was done for the above reasons as well and a preliminary review of DWI which is negative. No evidence of PRES.  Labs resulted later on with an alcohol level of 361  Systolic blood pressure on arrival around 200.  Impression: -Likely toxic encephalopathy from alcoholism. -Likely hypertensive encephalopathy as the differential  Recommendations: At this time, supportive care per the emergency room Less likely that he is actively seizing with that amount of alcohol level If he does not improve over the  next few hours, admit him for further observation and obtain an EEG in the morning No need for antiepileptics at this time. I do not think he has very high risk for withdrawal at this level but if he gets admitted, watch for signs of withdrawal from alcohol. From a hypertensive encephalopathy standpoint, I would bring his blood pressures down by 20% to a goal of around 160 or below. Outpatient PCP follow up and resources for assistance with Etoh abstinence per PCP.  Plan d/w Rhea Bleacher, PA in the ER  -- Milon Dikes, MD Neurologist [facility] Pager: [phone]

## 2020-10-02 NOTE — ED Triage Notes (Signed)
Pt bib ems LKW 1300 today. Friend found pt with AMS and R sided facial droop. BP 208/90 with EMS. Pt unable to follow commands, flaccid bil extremities.

## 2020-10-02 NOTE — Discharge Instructions (Addendum)
Please read and follow all provided instructions.  Your diagnoses today include:  1. Encephalopathy   2. Alcoholic intoxication without complication (HCC)   3. Primary hypertension     Tests performed today include: Blood cell counts (white, red, and platelets) Electrolytes  Kidney function test Alcohol level -was very high CT imaging and MRI -did not show signs of stroke Vital signs. See below for your results today.   Medications prescribed:  None  Take any prescribed medications only as directed.  Home care instructions:  Follow any educational materials contained in this packet.  BE VERY CAREFUL not to take multiple medicines containing Tylenol (also called acetaminophen). Doing so can lead to an overdose which can damage your liver and cause liver failure and possibly death.   Follow-up instructions: Please follow-up with your primary care provider in the next 3 days for further evaluation of your symptoms.   Return instructions:  Please return to the Emergency Department if you experience worsening symptoms.  Return if you have weakness in your arms or legs, slurred speech, trouble walking or talking, confusion, or trouble with your balance.  Please return if you have any other emergent concerns.  Additional Information:  Your vital signs today were: BP (!) 159/82   Pulse (!) 59   Temp 98.6 F (37 C) (Tympanic)   Resp 16   Wt 85.4 kg   SpO2 96%   BMI 26.26 kg/m  If your blood pressure (BP) was elevated above 135/85 this visit, please have this repeated by your doctor within one month. --------------

## 2020-10-02 NOTE — Code Documentation (Signed)
Stroke Response Nurse Documentation Code Documentation  John Spears is a 70 y.o. male arriving to [facility] ED via  EMS on 10/02/2020 with past medical hx of HTN. Code stroke was activated by EMS. Patient from home where he was LKW at 1300 and now complaining of general weakness, unresponsiveness, facial droop . On No antithrombotic. Stroke team at the bedside on patient arrival. Labs drawn and patient cleared for CT by EDP. Patient to CT with team. NIHSS 23, see documentation for details and code stroke times. Patient with decreased LOC, disoriented, not following commands, left gaze preference , right hemianopia, right facial droop, bilateral arm weakness, bilateral leg weakness, Global aphasia , and dysarthria  on exam. The following imaging was completed:  CT, CTA head and neck, CTP. Patient is not a candidate for tPA due to no stroke.  Care/Plan. Bedside handoff with ED RN Gregary Signs  Stroke Response RN

## 2020-10-02 NOTE — ED Notes (Signed)
Multiple attempts made to contact sister via cell & home phone number. No answer

## 2020-10-02 NOTE — ED Provider Notes (Addendum)
[facility] EMERGENCY DEPARTMENT Provider Note   CSN: [id number] Arrival date & time: 10/02/20  1659     History No chief complaint on file.   John Spears is a 70 y.o. male.  Patient with history of thalamic stroke in 2018, hypertension, alcoholism -presents to the emergency department for altered mental status.  Level 5 caveat due to altered mental status.  Patient presented by EMS as a code stroke.  Not following commands and is nonverbal.  Per EMS, facial droop, flaccid extremities bilaterally.       Past Medical History:  Diagnosis Date   Hypertension     Patient Active Problem List   Diagnosis Date Noted   Hypomagnesemia    Hypokalemia    Transaminitis    Traumatic rhabdomyolysis (HCC)    Thrombocytopenia (HCC)    Weakness    Lower extremity weakness 03/07/2019   Acute CVA (cerebrovascular accident) (HCC) 04/05/2016    Past Surgical History:  Procedure Laterality Date   JOINT REPLACEMENT         No family history on file.  Social History   Tobacco Use   Smoking status: Former   Smokeless tobacco: Never  Substance Use Topics   Alcohol use: Yes    Alcohol/week: 28.0 standard drinks    Types: 28 Cans of beer per week    Comment: drinks 1/5 per week or more   Drug use: No    Home Medications Prior to Admission medications   Medication Sig Start Date End Date Taking? Authorizing Provider  acetaminophen (TYLENOL) 325 MG tablet Take 2 tablets (650 mg total) by mouth every 6 (six) hours as needed for mild pain or fever (or Fever >/= 101). 03/14/19   Charise Killian, MD  amLODipine (NORVASC) 10 MG tablet Take 1 tablet (10 mg total) by mouth daily. 04/04/19 05/04/19  Jene Every, MD  aspirin EC 81 MG EC tablet Take 1 tablet (81 mg total) by mouth daily. 03/15/19   Charise Killian, MD  hydrALAZINE (APRESOLINE) 50 MG tablet Take 1 tablet (50 mg total) by mouth every 8 (eight) hours. 04/04/19 05/04/19  Jene Every, MD   hydrochlorothiazide (HYDRODIURIL) 25 MG tablet Take 25 mg by mouth daily. 04/08/20   [provider]  metoprolol succinate (TOPROL-XL) 25 MG 24 hr tablet Take 1 tablet (25 mg total) by mouth daily. 04/04/19 05/04/19  Jene Every, MD  potassium chloride (KLOR-CON) 10 MEQ tablet Take 10 mEq by mouth daily. 06/05/20   [provider]    Allergies    Patient has no known allergies.  Review of Systems   Review of Systems  Unable to perform ROS: Mental status change   Physical Exam Updated Vital Signs Wt 85.4 kg   BMI 26.26 kg/m   Physical Exam Vitals and nursing note reviewed.  Constitutional:      Appearance: He is well-developed.  HENT:     Head: Normocephalic and atraumatic.  Eyes:     Conjunctiva/sclera: Conjunctivae normal.  Pulmonary:     Effort: No respiratory distress.  Musculoskeletal:     Cervical back: Normal range of motion and neck supple.  Skin:    General: Skin is warm and dry.  Neurological:     Mental Status: He is alert. He is disoriented.     GCS: GCS eye subscore is 4. GCS verbal subscore is 2. GCS motor subscore is 5.     Cranial Nerves: No facial asymmetry.     Comments: Limited neurological  exam due to patient cooperation altered mental status.     ED Results / Procedures / Treatments   Labs (all labs ordered are listed, but only abnormal results are displayed) Labs Reviewed  CBC - Abnormal; Notable for the following components:      Result Value   RBC 3.68 (*)    Hemoglobin 11.9 (*)    HCT 35.1 (*)    Platelets 134 (*)    All other components within normal limits  DIFFERENTIAL - Abnormal; Notable for the following components:   Neutro Abs 1.5 (*)    All other components within normal limits  I-STAT CHEM 8, ED - Abnormal; Notable for the following components:   Creatinine, Ser 1.30 (*)    Calcium, Ion 0.98 (*)    All other components within normal limits  RESP PANEL BY RT-PCR (FLU A&B, COVID) ARPGX2  PROTIME-INR  APTT   ETHANOL  COMPREHENSIVE METABOLIC PANEL  RAPID URINE DRUG SCREEN, HOSP PERFORMED  URINALYSIS, ROUTINE W REFLEX MICROSCOPIC  CBG MONITORING, ED    EKG None  Radiology CT HEAD CODE STROKE WO CONTRAST  Result Date: 10/02/2020 CLINICAL DATA:  Code stroke.  Acute neuro deficit EXAM: CT HEAD WITHOUT CONTRAST TECHNIQUE: Contiguous axial images were obtained from the base of the skull through the vertex without intravenous contrast. COMPARISON:  CT head 03/07/2019.  MRI head 03/08/2019 FINDINGS: Brain: Generalized atrophy. Patchy white matter hypodensity bilaterally unchanged. Negative for acute infarct, hemorrhage, mass Vascular: Negative for hyperdense vessel Skull: Negative Sinuses/Orbits: Mild mucosal edema paranasal sinuses. Negative orbit Other: None ASPECTS (Alberta Stroke Program Early CT Score) - Ganglionic level infarction (caudate, lentiform nuclei, internal capsule, insula, M1-M3 cortex): 7 - Supraganglionic infarction (M4-M6 cortex): 3 Total score (0-10 with 10 being normal): 10 IMPRESSION: 1. No acute abnormality 2. ASPECTS is 10 3. Atrophy and chronic microvascular ischemic change in the white matter. No change from prior studies. 4. Code stroke imaging results were communicated on 10/02/2020 at 5:15 pm to provider Wilford Corner via text page Electronically Signed   By: Marlan Palau M.D.   On: 10/02/2020 17:15    Procedures Procedures   Medications Ordered in ED Medications  LORazepam (ATIVAN) injection 1 mg (1 mg Intravenous Not Given 10/02/20 1817)  LORazepam (ATIVAN) tablet 0-4 mg (0 mg Oral Not Given 10/02/20 1819)  LORazepam (ATIVAN) injection 0-4 mg (1 mg Intravenous Given 10/02/20 1731)    Or  LORazepam (ATIVAN) tablet 0-4 mg ( Oral See Alternative 10/02/20 1731)  thiamine tablet 100 mg ( Oral See Alternative 10/02/20 1827)    Or  thiamine (B-1) injection 100 mg (100 mg Intravenous Given 10/02/20 1827)  iohexol (OMNIPAQUE) 350 MG/ML injection 50 mL (50 mLs Intravenous Contrast Given  10/02/20 1726)  sodium chloride 0.9 % bolus 500 mL (0 mLs Intravenous Stopped 10/02/20 1943)  labetalol (NORMODYNE) injection 20 mg (20 mg Intravenous Given 10/02/20 1827)    ED Course  I have reviewed the triage vital signs and the nursing notes.  Pertinent labs & imaging results that were available during my care of the patient were reviewed by me and considered in my medical decision making (see chart for details).  Patient seen and examined.  Patient arrives as code stroke.  Airway cleared prior to imaging by myself.  Will need close monitoring as patient is not currently following commands, but is maintaining airway.  Vital signs reviewed and are as follows: Wt 85.4 kg   BMI 26.26 kg/m   Initial stroke work-up negative for hemorrhage.  MRI pending.  Patient's alcohol level is markedly elevated.  Will await sobriety and monitor closely.  Symptoms may be explained by alcohol intoxication.  Per neurology, if patient continues to be altered, plan for admission and possible EEG in the morning.  9:18 PM patient reassessed, family at bedside.  Discussed plan.  Patient is now answering basic questions.  He knows he is in the hospital.  Blood pressure is improved to target.  We will continue to monitor.  At this point, continuing to progress.  May be able to go home if he continues to improve and is able to eat, drink, safely ambulate.  I did obtain additional history from the patient's family member.  He was with another individual today who typically drinks more liquor.  Patient is a beer drinker, mainly with less intake as of late.  Patient likely drank liquor today per family.  BP (!) 154/87   Pulse 70   Temp 98.6 F (37 C) (Tympanic)   Resp 18   Wt 85.4 kg   SpO2 98%   BMI 26.26 kg/m   11:15 PM patient rechecked, sleeping.  Will need additional time to metabolize.  Signout to [employer] at shift change.  12:03 AM Pt has been up with walker. Safe ride is here to take him home. D/c  by myself.   CRITICAL CARE Performed by: Renne Crigler PA-C Total critical care time: 45 minutes Critical care time was exclusive of separately billable procedures and treating other patients. Critical care was necessary to treat or prevent imminent or life-threatening deterioration. Critical care was time spent personally by me on the following activities: development of treatment plan with patient and/or surrogate as well as nursing, discussions with consultants, evaluation of patient's response to treatment, examination of patient, obtaining history from patient or surrogate, ordering and performing treatments and interventions, ordering and review of laboratory studies, ordering and review of radiographic studies, pulse oximetry and re-evaluation of patient's condition.     MDM Rules/Calculators/A&P                           Patient with encephalopathy presenting as a code stroke.  Imaging was negative.  Patient found to have very elevated ethanol level.  Considered hypertensive encephalopathy patient was given IV labetalol.  Blood pressure has improved.  He has been gradually improving with mentation during ED stay.  We will continue to monitor.  If he can eat, drink, ambulate safely, he can be discharged.  If he does not return to baseline, may need admission for further evaluation.  Appreciate neurology recommendations.   Final Clinical Impression(s) / ED Diagnoses Final diagnoses:  Encephalopathy  Alcoholic intoxication without complication [facility])    Rx / DC Orders ED Discharge Orders     None        Renne Crigler, PA-C 10/02/20 2317    Renne Crigler, PA-C 10/03/20 0003    Gwyneth Sprout, MD 10/07/20 [phone]

## 2020-12-04 ENCOUNTER — Emergency Department
Admission: EM | Admit: 2020-12-04 | Discharge: 2020-12-04 | Disposition: A | Discharge status: Home / Self Care | Payer: Medicare Other | Attending: Emergency Medicine | Admitting: Emergency Medicine

## 2020-12-04 ENCOUNTER — Other Ambulatory Visit: Payer: Self-pay

## 2020-12-04 DIAGNOSIS — R066 Hiccough: Secondary | ICD-10-CM | POA: Insufficient documentation

## 2020-12-04 DIAGNOSIS — I10 Essential (primary) hypertension: Secondary | ICD-10-CM | POA: Insufficient documentation

## 2020-12-04 DIAGNOSIS — Z79899 Other long term (current) drug therapy: Secondary | ICD-10-CM | POA: Diagnosis not present

## 2020-12-04 DIAGNOSIS — Z87891 Personal history of nicotine dependence: Secondary | ICD-10-CM | POA: Insufficient documentation

## 2020-12-04 DIAGNOSIS — Z96698 Presence of other orthopedic joint implants: Secondary | ICD-10-CM | POA: Insufficient documentation

## 2020-12-04 DIAGNOSIS — Z7982 Long term (current) use of aspirin: Secondary | ICD-10-CM | POA: Diagnosis not present

## 2020-12-04 LAB — COMPREHENSIVE METABOLIC PANEL
ALT: 23 U/L (ref 0–44)
AST: 62 U/L — ABNORMAL HIGH (ref 15–41)
Albumin: 3.8 g/dL (ref 3.5–5.0)
Alkaline Phosphatase: 67 U/L (ref 38–126)
Anion gap: 15 (ref 5–15)
BUN: 11 mg/dL (ref 8–23)
CO2: 22 mmol/L (ref 22–32)
Calcium: 8.6 mg/dL — ABNORMAL LOW (ref 8.9–10.3)
Chloride: 100 mmol/L (ref 98–111)
Creatinine, Ser: 0.9 mg/dL (ref 0.61–1.24)
GFR, Estimated: >60 mL/min (ref >60)
Glucose, Bld: 93 mg/dL (ref 70–99)
Potassium: 3.5 mmol/L (ref 3.5–5.1)
Sodium: 137 mmol/L (ref 135–145)
Total Bilirubin: 0.9 mg/dL (ref 0.3–1.2)
Total Protein: 7.2 g/dL (ref 6.5–8.1)

## 2020-12-04 LAB — CBC
HCT: 34.6 % — ABNORMAL LOW (ref 39.0–52.0)
Hemoglobin: 12.2 g/dL — ABNORMAL LOW (ref 13.0–17.0)
MCH: 32.3 pg (ref 26.0–34.0)
MCHC: 35.3 g/dL (ref 30.0–36.0)
MCV: 91.5 fL (ref 80.0–100.0)
Platelets: 144 10*3/uL — ABNORMAL LOW (ref 150–400)
RBC: 3.78 MIL/uL — ABNORMAL LOW (ref 4.22–5.81)
RDW: 15 % (ref 11.5–15.5)
WBC: 4.6 10*3/uL (ref 4.0–10.5)
nRBC: 0 % (ref 0.0–0.2)

## 2020-12-04 MED ORDER — FAMOTIDINE 20 MG PO TABS: 20.0000 mg | ORAL_TABLET | Freq: Two times a day (BID) | ORAL | 0 refills | Status: DC

## 2020-12-04 NOTE — ED Provider Notes (Signed)
Emergency Medicine Provider Triage Evaluation Note  John Spears, a 70 y.o. male  was evaluated in triage.  Pt complains of hiccups for the last several day. He is an admitted everyday drinker, with his last drink today. He denies any head injury, LOC, C/P, SOB,N/V, or abdominal pain.   Review of Systems  Positive: hiccups Negative: CP, SOB  Physical Exam  BP (!) 188/94   Pulse 68   Temp 98.2 F (36.8 C)   Resp 20   Ht 5\' 11"  (1.803 m)   Wt 81.6 kg   SpO2 99%   BMI 25.10 kg/m  Gen:   Awake, no distress  NAD Resp:  Normal effort CTA MSK:   Moves extremities without difficulty  Other:  CVS: RRR  Medical Decision Making  Medically screening exam initiated at 4:39 PM.  Appropriate orders placed.  John Spears was informed that the remainder of the evaluation will be completed by another provider, this initial triage assessment does not replace that evaluation, and the importance of remaining in the ED until their evaluation is complete.  Patient who is an everyday drinker, presenting with hiccups for the last 3-4 days.    Everett Graff, PA-C 12/04/20 1640    02/03/21, MD 12/04/20 [phone]

## 2020-12-04 NOTE — ED Notes (Signed)
Pt given discharge paperwork by dr Scotty Court due to pt's ride being here and pt ready to leave as he got back to the room

## 2020-12-04 NOTE — ED Provider Notes (Signed)
[facility] Emergency Department Provider Note  ____________________________________________  Time seen: Approximately 6:02 PM  I have reviewed the triage vital signs and the nursing notes.   HISTORY  Chief Complaint hiccups   HPI John Spears is a 70 y.o. male with a history of hypertension and daily drinking who comes ED complaining of hiccups for the last 4 days, constant, no aggravating or alleviating factors.  Actually by the time he arrived in the treatment room his hiccups resolved and he now reports that he is asymptomatic.  Denies any pain or vomiting.  No fever.  Reports that his ride is waiting for him and he wishes to be discharged immediately.    Past Medical History:  Diagnosis Date   Hypertension      Patient Active Problem List   Diagnosis Date Noted   Hypomagnesemia    Hypokalemia    Transaminitis    Traumatic rhabdomyolysis (HCC)    Thrombocytopenia (HCC)    Weakness    Lower extremity weakness 03/07/2019   Acute CVA (cerebrovascular accident) (HCC) 04/05/2016     Past Surgical History:  Procedure Laterality Date   JOINT REPLACEMENT       Prior to Admission medications   Medication Sig Start Date End Date Taking? Authorizing Provider  famotidine (PEPCID) 20 MG tablet Take 1 tablet (20 mg total) by mouth 2 (two) times daily. 12/04/20  Yes Sharman Cheek, MD  acetaminophen (TYLENOL) 325 MG tablet Take 2 tablets (650 mg total) by mouth every 6 (six) hours as needed for mild pain or fever (or Fever >/= 101). 03/14/19   Charise Killian, MD  amLODipine (NORVASC) 10 MG tablet Take 1 tablet (10 mg total) by mouth daily. 04/04/19 05/04/19  Jene Every, MD  aspirin EC 81 MG EC tablet Take 1 tablet (81 mg total) by mouth daily. 03/15/19   Charise Killian, MD  hydrALAZINE (APRESOLINE) 50 MG tablet Take 1 tablet (50 mg total) by mouth every 8 (eight) hours. 04/04/19 05/04/19  Jene Every, MD  hydrochlorothiazide (HYDRODIURIL)  25 MG tablet Take 25 mg by mouth daily. 04/08/20   [provider]  metoprolol succinate (TOPROL-XL) 25 MG 24 hr tablet Take 1 tablet (25 mg total) by mouth daily. 04/04/19 05/04/19  Jene Every, MD  potassium chloride (KLOR-CON) 10 MEQ tablet Take 10 mEq by mouth daily. 06/05/20   [provider]     Allergies Patient has no known allergies.   No family history on file.  Social History Social History   Tobacco Use   Smoking status: Former   Smokeless tobacco: Never  Substance Use Topics   Alcohol use: Yes    Alcohol/week: 28.0 standard drinks    Types: 28 Cans of beer per week    Comment: drinks 1/5 per week or more   Drug use: Yes    Types: Marijuana    Review of Systems  Constitutional:   No fever or chills.  ENT:   No sore throat. No rhinorrhea. Cardiovascular:   No chest pain or syncope. Respiratory:   No dyspnea or cough. Gastrointestinal:   Negative for abdominal pain, vomiting and diarrhea.  Musculoskeletal:   Negative for focal pain or swelling All other systems reviewed and are negative except as documented above in ROS and HPI.  ____________________________________________   PHYSICAL EXAM:  VITAL SIGNS: ED Triage Vitals  Enc Vitals Group     BP 12/04/20 1617 (!) 188/94     Pulse Rate 12/04/20 1617 68  Resp 12/04/20 1617 20     Temp 12/04/20 1617 98.2 F (36.8 C)     Temp src --      SpO2 12/04/20 1617 99 %     Weight 12/04/20 1618 180 lb (81.6 kg)     Height 12/04/20 1618 5\' 11"  (1.803 m)     Head Circumference --      Peak Flow --      Pain Score 12/04/20 1618 0     Pain Loc --      Pain Edu? --      Excl. in GC? --     Vital signs reviewed, nursing assessments reviewed.   Constitutional:   Alert and oriented. Non-toxic appearance. Eyes:   Conjunctivae are normal. EOMI. ENT      Head:   Normocephalic and atraumatic.         Neck:   No meningismus. Full ROM. . Cardiovascular:   RRR. Symmetric bilateral radial and DP  pulses.  No murmurs. Cap refill less than 2 seconds. Respiratory:   Normal respiratory effort without tachypnea/retractions. Breath sounds are clear and equal bilaterally. No wheezes/rales/rhonchi. Gastrointestinal:   Soft and nontender. Non distended. There is no CVA tenderness.  No rebound, rigidity, or guarding. Musculoskeletal:   Normal range of motion in all extremities.  No edema. Neurologic:   Normal speech and language.  Motor grossly intact. Ambulatory with steady gait No acute focal neurologic deficits are appreciated.  Skin:    Skin is warm, dry and intact. No rash noted.  No wounds.  ____________________________________________    LABS (pertinent positives/negatives) (all labs ordered are listed, but only abnormal results are displayed) Labs Reviewed  COMPREHENSIVE METABOLIC PANEL - Abnormal; Notable for the following components:      Result Value   Calcium 8.6 (*)    AST 62 (*)    All other components within normal limits  CBC - Abnormal; Notable for the following components:   RBC 3.78 (*)    Hemoglobin 12.2 (*)    HCT 34.6 (*)    Platelets 144 (*)    All other components within normal limits  ETHANOL   ____________________________________________   EKG  ____________________________________________    RADIOLOGY  No results found.  ____________________________________________   PROCEDURES Procedures  ____________________________________________  CLINICAL IMPRESSION / ASSESSMENT AND PLAN / ED COURSE  Pertinent labs & imaging results that were available during my care of the patient were reviewed by me and considered in my medical decision making (see chart for details).  John Spears was evaluated in Emergency Department on 12/04/2020 for the symptoms described in the history of present illness. He was evaluated in the context of the global COVID-19 pandemic, which necessitated consideration that the patient might be at risk for infection with the  SARS-CoV-2 virus that causes COVID-19. Institutional protocols and algorithms that pertain to the evaluation of patients at risk for COVID-19 are in a state of rapid change based on information released by regulatory bodies including the CDC and federal and state organizations. These policies and algorithms were followed during the patient's care in the ED.   Patient presents with a complaint of hiccups.  Vital signs are unremarkable except for inadequately controlled hypertension.  Patient is clinically sober, no evidence of withdrawal on exam.  Symptoms have resolved and he feels back to normal.  Exam is reassuring and he stable for discharge.  I suspect this is related to gastritis related to alcohol use.      ____________________________________________  FINAL CLINICAL IMPRESSION(S) / ED DIAGNOSES    Final diagnoses:  Singultus     ED Discharge Orders          Ordered    famotidine (PEPCID) 20 MG tablet  2 times daily        12/04/20 1802            Portions of this note were generated with dragon dictation software. Dictation errors may occur despite best attempts at proofreading.   Sharman Cheek, MD 12/04/20 [phone]

## 2020-12-04 NOTE — ED Triage Notes (Signed)
MSE in triage Elliott PA

## 2020-12-04 NOTE — ED Triage Notes (Signed)
Pt to ED for hiccups for 3-4 days. States cannot get rid of them.  Denies pain/shob Daily drinker. Last drink this am

## 2021-10-12 ENCOUNTER — Emergency Department
Admission: EM | Admit: 2021-10-12 | Discharge: 2021-10-12 | Discharge status: Against Medical Advice | Payer: 59 | Attending: Emergency Medicine | Admitting: Emergency Medicine

## 2021-10-12 ENCOUNTER — Encounter: Payer: Self-pay | Admitting: Emergency Medicine

## 2021-10-12 ENCOUNTER — Other Ambulatory Visit: Payer: Self-pay

## 2021-10-12 DIAGNOSIS — F1010 Alcohol abuse, uncomplicated: Secondary | ICD-10-CM | POA: Diagnosis not present

## 2021-10-12 DIAGNOSIS — R109 Unspecified abdominal pain: Secondary | ICD-10-CM | POA: Diagnosis present

## 2021-10-12 DIAGNOSIS — Z5321 Procedure and treatment not carried out due to patient leaving prior to being seen by health care provider: Secondary | ICD-10-CM | POA: Insufficient documentation

## 2021-10-12 DIAGNOSIS — Y908 Blood alcohol level of 240 mg/100 ml or more: Secondary | ICD-10-CM | POA: Diagnosis not present

## 2021-10-12 LAB — CBC
HCT: 33.9 % — ABNORMAL LOW (ref 39.0–52.0)
Hemoglobin: 11.3 g/dL — ABNORMAL LOW (ref 13.0–17.0)
MCH: 30.7 pg (ref 26.0–34.0)
MCHC: 33.3 g/dL (ref 30.0–36.0)
MCV: 92.1 fL (ref 80.0–100.0)
Platelets: 144 K/uL — ABNORMAL LOW (ref 150–400)
RBC: 3.68 MIL/uL — ABNORMAL LOW (ref 4.22–5.81)
RDW: 17.2 % — ABNORMAL HIGH (ref 11.5–15.5)
WBC: 5.8 K/uL (ref 4.0–10.5)
nRBC: 0 % (ref 0.0–0.2)

## 2021-10-12 LAB — COMPREHENSIVE METABOLIC PANEL
ALT: 17 U/L (ref 0–44)
AST: 50 U/L — ABNORMAL HIGH (ref 15–41)
Albumin: 3.6 g/dL (ref 3.5–5.0)
Alkaline Phosphatase: 68 U/L (ref 38–126)
Anion gap: 14 (ref 5–15)
BUN: 8 mg/dL (ref 8–23)
CO2: 22 mmol/L (ref 22–32)
Calcium: 8.8 mg/dL — ABNORMAL LOW (ref 8.9–10.3)
Chloride: 103 mmol/L (ref 98–111)
Creatinine, Ser: 0.86 mg/dL (ref 0.61–1.24)
GFR, Estimated: >60 mL/min (ref >60)
Glucose, Bld: 101 mg/dL — ABNORMAL HIGH (ref 70–99)
Potassium: 3.8 mmol/L (ref 3.5–5.1)
Sodium: 139 mmol/L (ref 135–145)
Total Bilirubin: 1.1 mg/dL (ref 0.3–1.2)
Total Protein: 6.8 g/dL (ref 6.5–8.1)

## 2021-10-12 LAB — LIPASE, BLOOD: Lipase: 79 U/L — ABNORMAL HIGH (ref 11–51)

## 2021-10-12 LAB — ETHANOL: Alcohol, Ethyl (B): 282 mg/dL — ABNORMAL HIGH (ref <10)

## 2021-10-12 NOTE — ED Triage Notes (Signed)
Pt reports drinking daily. Reports drinking beer and liquor. Pt reports drinking 12 beers and 1 pint of liquor daily.

## 2021-10-12 NOTE — ED Triage Notes (Signed)
Pt in via EMS from home with c/o A family friend came by to check on him and states he was not acting his normal self.   Pt admitted to drinking 5-6 beers today and admits that he is non compliant with meds. EMS reports negative stroke screen

## 2021-11-07 IMAGING — CT CT ANGIO HEAD-NECK (W OR W/O PERF)
2 of 7 series · 8 of 33 positions shown · IV contrast (APPLIED)
Comparison: CT head 10/02/2020

CLINICAL DATA: Stroke follow-up.  Right facial droop

EXAM:
CT ANGIOGRAPHY HEAD AND NECK
TECHNIQUE: Multidetector CT imaging of the head and neck was performed using
the standard protocol during bolus administration of intravenous
contrast. Multiplanar CT image reconstructions and MIPs were
obtained to evaluate the vascular anatomy. Carotid stenosis
measurements (when applicable) are obtained utilizing NASCET
criteria, using the distal internal carotid diameter as the
denominator.
CONTRAST:  50mL OMNIPAQUE IOHEXOL 350 MG/ML SOLN

[Series 6: cta neck/head · axial · 0.61mm/px · z∈[+1233,+1347]mm · 2 of 172 slices shown]
[im 58/172  soft-tissue]
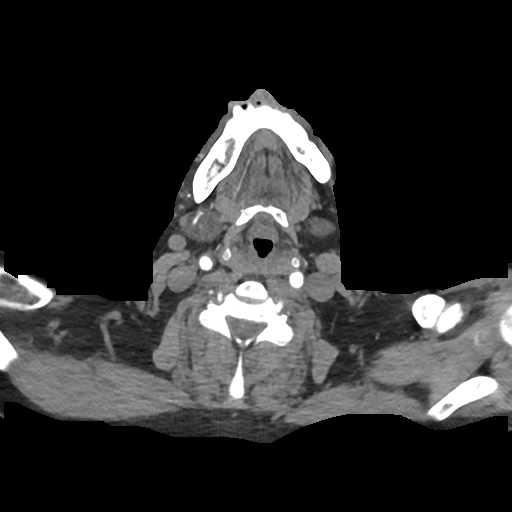
[im 115/172  soft-tissue]
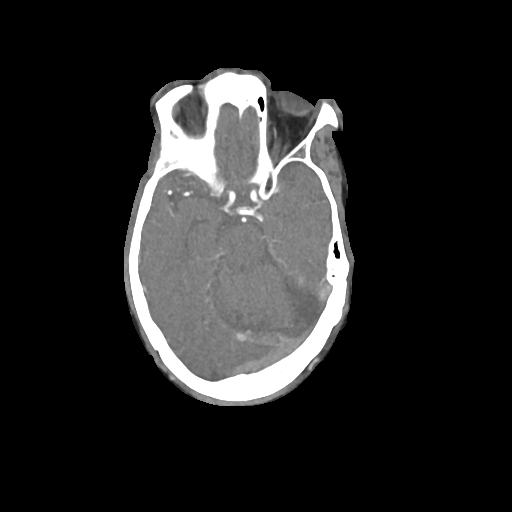

[Series 8: ax thins · axial · 0.52mm/px · z∈[+1115,+1362]mm · 6 of 354 slices shown]
[im 51/354  soft-tissue]
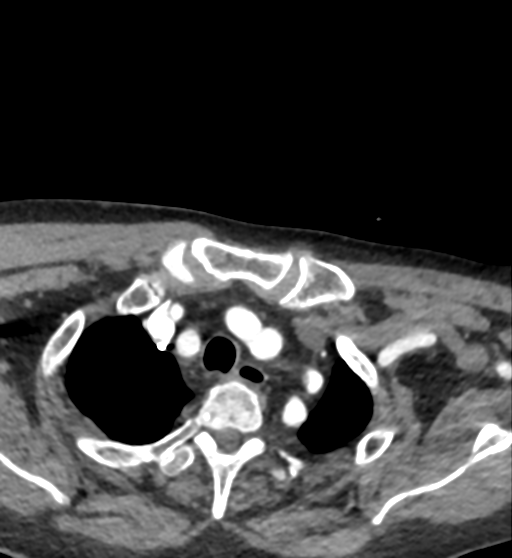
[im 101/354  bone]
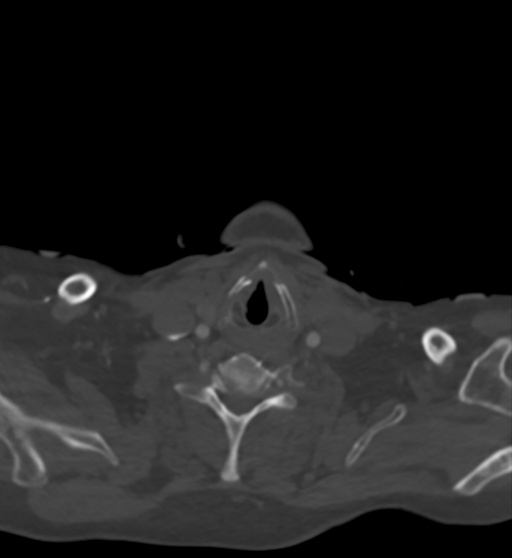
[im 152/354  soft-tissue]
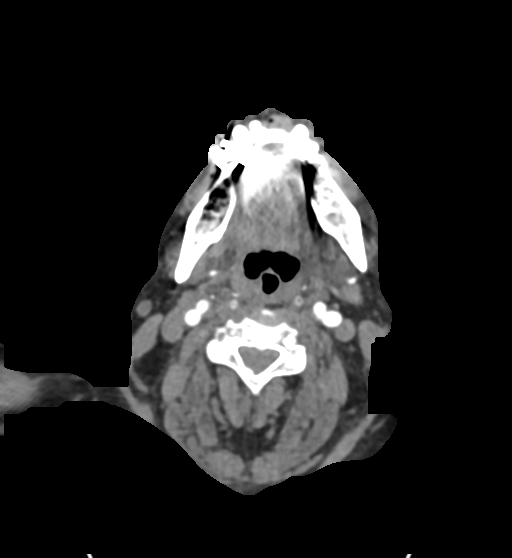
[im 202/354  bone]
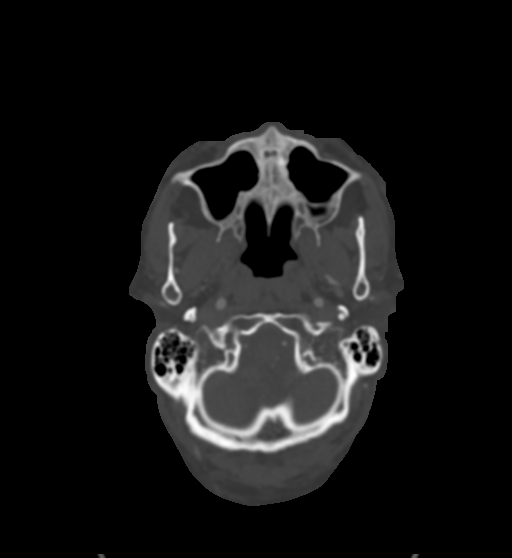
[im 253/354  soft-tissue]
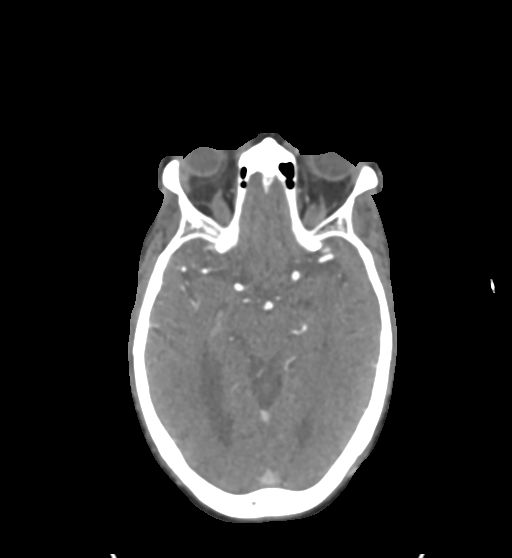
[im 303/354  bone]
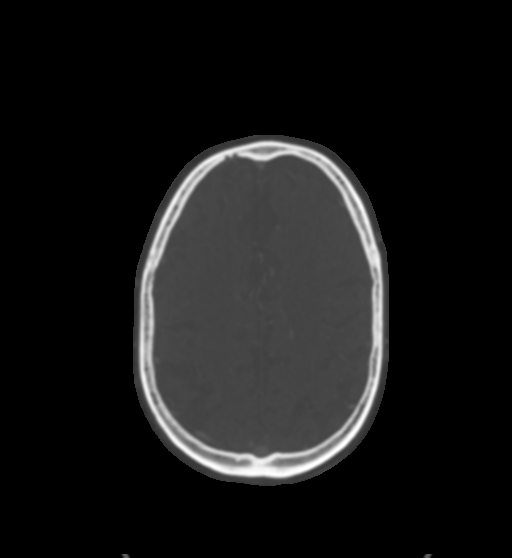

[8 of 33 positions shown; findings below may reference images not displayed]

FINDINGS: CTA NECK FINDINGS

Aortic arch: Negative aortic arch. Proximal great vessels are
tortuous but widely patent.

Right carotid system: Right carotid widely patent without stenosis
or atherosclerotic disease

Left carotid system: Left carotid widely patent without stenosis or
atherosclerotic disease

Vertebral arteries: Both vertebral arteries widely patent to the
basilar without stenosis

Skeleton: Mild cervical spondylosis.  No acute skeletal abnormality.

Other neck: Negative for mass or adenopathy.

Upper chest: Lung apices clear bilaterally.

Review of the MIP images confirms the above findings

CTA HEAD FINDINGS

Anterior circulation: Internal carotid artery widely patent through
the skull base and cavernous segment. Anterior and middle cerebral
arteries widely patent without stenosis or large vessel occlusion.

Posterior circulation: Both vertebral arteries patent to the
basilar. PICA patent bilaterally. Basilar widely patent. Superior
cerebellar and posterior cerebral arteries patent bilaterally
without stenosis or large vessel occlusion.

Venous sinuses: Normal venous enhancement

Anatomic variants: None

Review of the MIP images confirms the above findings
IMPRESSION: Negative CT angio head neck. No significant stenosis or large vessel
occlusion.

## 2022-02-20 ENCOUNTER — Emergency Department: Payer: 59

## 2022-02-20 ENCOUNTER — Inpatient Hospital Stay
Admission: EM | Admit: 2022-02-20 | Discharge: 2022-03-03 | DRG: 896 | Disposition: A | Discharge status: Skilled Nursing Facility | Payer: 59 | Attending: Student | Admitting: Student

## 2022-02-20 ENCOUNTER — Other Ambulatory Visit: Payer: Self-pay

## 2022-02-20 DIAGNOSIS — R739 Hyperglycemia, unspecified: Secondary | ICD-10-CM | POA: Diagnosis present

## 2022-02-20 DIAGNOSIS — I5021 Acute systolic (congestive) heart failure: Secondary | ICD-10-CM | POA: Diagnosis not present

## 2022-02-20 DIAGNOSIS — E559 Vitamin D deficiency, unspecified: Secondary | ICD-10-CM | POA: Diagnosis present

## 2022-02-20 DIAGNOSIS — D509 Iron deficiency anemia, unspecified: Secondary | ICD-10-CM | POA: Diagnosis present

## 2022-02-20 DIAGNOSIS — Z7982 Long term (current) use of aspirin: Secondary | ICD-10-CM

## 2022-02-20 DIAGNOSIS — Z87891 Personal history of nicotine dependence: Secondary | ICD-10-CM

## 2022-02-20 DIAGNOSIS — D539 Nutritional anemia, unspecified: Secondary | ICD-10-CM | POA: Diagnosis present

## 2022-02-20 DIAGNOSIS — F10221 Alcohol dependence with intoxication delirium: Secondary | ICD-10-CM | POA: Diagnosis present

## 2022-02-20 DIAGNOSIS — E876 Hypokalemia: Secondary | ICD-10-CM | POA: Diagnosis not present

## 2022-02-20 DIAGNOSIS — R7989 Other specified abnormal findings of blood chemistry: Secondary | ICD-10-CM | POA: Diagnosis not present

## 2022-02-20 DIAGNOSIS — I5043 Acute on chronic combined systolic (congestive) and diastolic (congestive) heart failure: Secondary | ICD-10-CM | POA: Diagnosis present

## 2022-02-20 DIAGNOSIS — E872 Acidosis, unspecified: Secondary | ICD-10-CM | POA: Diagnosis present

## 2022-02-20 DIAGNOSIS — J690 Pneumonitis due to inhalation of food and vomit: Secondary | ICD-10-CM | POA: Diagnosis present

## 2022-02-20 DIAGNOSIS — J9690 Respiratory failure, unspecified, unspecified whether with hypoxia or hypercapnia: Secondary | ICD-10-CM | POA: Diagnosis present

## 2022-02-20 DIAGNOSIS — Z1152 Encounter for screening for COVID-19: Secondary | ICD-10-CM | POA: Diagnosis not present

## 2022-02-20 DIAGNOSIS — G40901 Epilepsy, unspecified, not intractable, with status epilepticus: Secondary | ICD-10-CM | POA: Diagnosis not present

## 2022-02-20 DIAGNOSIS — I110 Hypertensive heart disease with heart failure: Secondary | ICD-10-CM | POA: Diagnosis present

## 2022-02-20 DIAGNOSIS — Z91148 Patient's other noncompliance with medication regimen for other reason: Secondary | ICD-10-CM | POA: Diagnosis not present

## 2022-02-20 DIAGNOSIS — I2489 Other forms of acute ischemic heart disease: Secondary | ICD-10-CM | POA: Diagnosis not present

## 2022-02-20 DIAGNOSIS — R569 Unspecified convulsions: Secondary | ICD-10-CM | POA: Diagnosis not present

## 2022-02-20 DIAGNOSIS — G9340 Encephalopathy, unspecified: Secondary | ICD-10-CM | POA: Diagnosis not present

## 2022-02-20 DIAGNOSIS — F10239 Alcohol dependence with withdrawal, unspecified: Secondary | ICD-10-CM | POA: Diagnosis present

## 2022-02-20 DIAGNOSIS — R197 Diarrhea, unspecified: Secondary | ICD-10-CM | POA: Diagnosis not present

## 2022-02-20 DIAGNOSIS — Z79899 Other long term (current) drug therapy: Secondary | ICD-10-CM | POA: Diagnosis not present

## 2022-02-20 DIAGNOSIS — F1010 Alcohol abuse, uncomplicated: Secondary | ICD-10-CM | POA: Diagnosis not present

## 2022-02-20 DIAGNOSIS — Z8673 Personal history of transient ischemic attack (TIA), and cerebral infarction without residual deficits: Secondary | ICD-10-CM | POA: Diagnosis present

## 2022-02-20 DIAGNOSIS — I420 Dilated cardiomyopathy: Secondary | ICD-10-CM

## 2022-02-20 DIAGNOSIS — I639 Cerebral infarction, unspecified: Secondary | ICD-10-CM | POA: Diagnosis present

## 2022-02-20 DIAGNOSIS — I10 Essential (primary) hypertension: Secondary | ICD-10-CM | POA: Diagnosis not present

## 2022-02-20 LAB — URINALYSIS, ROUTINE W REFLEX MICROSCOPIC
Bacteria, UA: NONE SEEN
Bilirubin Urine: NEGATIVE
Glucose, UA: NEGATIVE mg/dL
Ketones, ur: NEGATIVE mg/dL
Leukocytes,Ua: NEGATIVE
Nitrite: NEGATIVE
Protein, ur: 100 mg/dL — AB
Specific Gravity, Urine: 1.014 (ref 1.005–1.030)
pH: 5 (ref 5.0–8.0)

## 2022-02-20 LAB — APTT: aPTT: 21 seconds — ABNORMAL LOW (ref 24–36)

## 2022-02-20 LAB — BASIC METABOLIC PANEL
Anion gap: 13 (ref 5–15)
BUN: 11 mg/dL (ref 8–23)
CO2: 21 mmol/L — ABNORMAL LOW (ref 22–32)
Calcium: 8.7 mg/dL — ABNORMAL LOW (ref 8.9–10.3)
Chloride: 106 mmol/L (ref 98–111)
Creatinine, Ser: 0.98 mg/dL (ref 0.61–1.24)
GFR, Estimated: >60 mL/min (ref >60)
Glucose, Bld: 113 mg/dL — ABNORMAL HIGH (ref 70–99)
Potassium: 5.6 mmol/L — ABNORMAL HIGH (ref 3.5–5.1)
Sodium: 140 mmol/L (ref 135–145)

## 2022-02-20 LAB — COMPREHENSIVE METABOLIC PANEL
ALT: 20 U/L (ref 0–44)
AST: 69 U/L — ABNORMAL HIGH (ref 15–41)
Albumin: 3.4 g/dL — ABNORMAL LOW (ref 3.5–5.0)
Alkaline Phosphatase: 63 U/L (ref 38–126)
Anion gap: 20 — ABNORMAL HIGH (ref 5–15)
BUN: 11 mg/dL (ref 8–23)
CO2: 16 mmol/L — ABNORMAL LOW (ref 22–32)
Calcium: 9.3 mg/dL (ref 8.9–10.3)
Chloride: 106 mmol/L (ref 98–111)
Creatinine, Ser: 1.1 mg/dL (ref 0.61–1.24)
GFR, Estimated: >60 mL/min (ref >60)
Glucose, Bld: 163 mg/dL — ABNORMAL HIGH (ref 70–99)
Potassium: 4.2 mmol/L (ref 3.5–5.1)
Sodium: 142 mmol/L (ref 135–145)
Total Bilirubin: 0.8 mg/dL (ref 0.3–1.2)
Total Protein: 6.2 g/dL — ABNORMAL LOW (ref 6.5–8.1)

## 2022-02-20 LAB — URINE DRUG SCREEN, QUALITATIVE (ARMC ONLY)
Amphetamines, Ur Screen: NOT DETECTED
Barbiturates, Ur Screen: NOT DETECTED
Benzodiazepine, Ur Scrn: POSITIVE — AB
Cannabinoid 50 Ng, Ur ~~LOC~~: POSITIVE — AB
Cocaine Metabolite,Ur ~~LOC~~: NOT DETECTED
MDMA (Ecstasy)Ur Screen: NOT DETECTED
Methadone Scn, Ur: NOT DETECTED
Opiate, Ur Screen: NOT DETECTED
Phencyclidine (PCP) Ur S: NOT DETECTED
Tricyclic, Ur Screen: NOT DETECTED

## 2022-02-20 LAB — CBC WITH DIFFERENTIAL/PLATELET
Abs Immature Granulocytes: 0.05 10*3/uL (ref 0.00–0.07)
Basophils Absolute: 0.1 10*3/uL (ref 0.0–0.1)
Basophils Relative: 1 %
Eosinophils Absolute: 0 10*3/uL (ref 0.0–0.5)
Eosinophils Relative: 0 %
HCT: 30.8 % — ABNORMAL LOW (ref 39.0–52.0)
Hemoglobin: 9.6 g/dL — ABNORMAL LOW (ref 13.0–17.0)
Immature Granulocytes: 1 %
Lymphocytes Relative: 43 %
Lymphs Abs: 3.4 10*3/uL (ref 0.7–4.0)
MCH: 33.8 pg (ref 26.0–34.0)
MCHC: 31.2 g/dL (ref 30.0–36.0)
MCV: 108.5 fL — ABNORMAL HIGH (ref 80.0–100.0)
Monocytes Absolute: 0.9 10*3/uL (ref 0.1–1.0)
Monocytes Relative: 11 %
Neutro Abs: 3.5 10*3/uL (ref 1.7–7.7)
Neutrophils Relative %: 44 %
Platelets: 201 10*3/uL (ref 150–400)
RBC: 2.84 MIL/uL — ABNORMAL LOW (ref 4.22–5.81)
RDW: 17.8 % — ABNORMAL HIGH (ref 11.5–15.5)
WBC: 7.9 10*3/uL (ref 4.0–10.5)
nRBC: 0.8 % — ABNORMAL HIGH (ref 0.0–0.2)

## 2022-02-20 LAB — BLOOD GAS, ARTERIAL
Acid-base deficit: 9 mmol/L — ABNORMAL HIGH (ref 0.0–2.0)
Bicarbonate: 14.5 mmol/L — ABNORMAL LOW (ref 20.0–28.0)
FIO2: 28 %
MECHVT: 500 mL
Mechanical Rate: 18
O2 Saturation: 99.4 %
PEEP: 5 cmH2O
Patient temperature: 37
pCO2 arterial: 25 mmHg — ABNORMAL LOW (ref 32–48)
pH, Arterial: 7.37 (ref 7.35–7.45)
pO2, Arterial: 115 mmHg — ABNORMAL HIGH (ref 83–108)

## 2022-02-20 LAB — GLUCOSE, CAPILLARY: Glucose-Capillary: 77 mg/dL (ref 70–99)

## 2022-02-20 LAB — PROTIME-INR
INR: 1.2 (ref 0.8–1.2)
Prothrombin Time: 15.5 seconds — ABNORMAL HIGH (ref 11.4–15.2)

## 2022-02-20 LAB — TROPONIN I (HIGH SENSITIVITY)
Troponin I (High Sensitivity): 27 ng/L — ABNORMAL HIGH (ref <18)
Troponin I (High Sensitivity): 124 ng/L (ref <18)
Troponin I (High Sensitivity): 188 ng/L (ref <18)
Troponin I (High Sensitivity): 291 ng/L (ref <18)

## 2022-02-20 LAB — ETHANOL: Alcohol, Ethyl (B): <10 mg/dL (ref <10)

## 2022-02-20 LAB — SALICYLATE LEVEL: Salicylate Lvl: <7 mg/dL — ABNORMAL LOW (ref 7.0–30.0)

## 2022-02-20 LAB — PROCALCITONIN: Procalcitonin: 0.11 ng/mL

## 2022-02-20 LAB — LACTIC ACID, PLASMA
Lactic Acid, Venous: >9 mmol/L (ref 0.5–1.9)
Lactic Acid, Venous: 7.4 mmol/L (ref 0.5–1.9)

## 2022-02-20 LAB — LIPASE, BLOOD: Lipase: 38 U/L (ref 11–51)

## 2022-02-20 LAB — ACETAMINOPHEN LEVEL: Acetaminophen (Tylenol), Serum: <10 ug/mL — ABNORMAL LOW (ref 10–30)

## 2022-02-20 LAB — MRSA NEXT GEN BY PCR, NASAL: MRSA by PCR Next Gen: NOT DETECTED

## 2022-02-20 LAB — CBG MONITORING, ED: Glucose-Capillary: 147 mg/dL — ABNORMAL HIGH (ref 70–99)

## 2022-02-20 MED ORDER — PANTOPRAZOLE SODIUM 40 MG IV SOLR
40.0000 mg | Freq: Every day | INTRAVENOUS | Status: DC
  Administered 2022-02-20 – 2022-02-24 (×5): 40 mg via INTRAVENOUS
  Filled 2022-02-20 (×5): qty 10

## 2022-02-20 MED ORDER — SODIUM CHLORIDE 0.9 % IV SOLN
3.0000 g | Freq: Four times a day (QID) | INTRAVENOUS | Status: DC
  Administered 2022-02-20 – 2022-02-25 (×18): 3 g via INTRAVENOUS
  Filled 2022-02-20 (×3): qty 3
  Filled 2022-02-20: qty 8
  Filled 2022-02-20 (×4): qty 3
  Filled 2022-02-20: qty 8
  Filled 2022-02-20: qty 3
  Filled 2022-02-20 (×3): qty 8
  Filled 2022-02-20 (×2): qty 3
  Filled 2022-02-20: qty 8
  Filled 2022-02-20 (×6): qty 3

## 2022-02-20 MED ORDER — FENTANYL BOLUS VIA INFUSION
25.0000 ug | INTRAVENOUS | Status: DC | PRN
  Administered 2022-02-20: 50 ug via INTRAVENOUS

## 2022-02-20 MED ORDER — THIAMINE HCL 100 MG/ML IJ SOLN
500.0000 mg | INTRAVENOUS | Status: AC
  Administered 2022-02-20 – 2022-02-22 (×3): 500 mg via INTRAVENOUS
  Filled 2022-02-20 (×3): qty 5

## 2022-02-20 MED ORDER — LEVETIRACETAM IN NACL 1500 MG/100ML IV SOLN
1500.0000 mg | Freq: Once | INTRAVENOUS | Status: AC
  Administered 2022-02-20: 1500 mg via INTRAVENOUS
  Filled 2022-02-20: qty 100

## 2022-02-20 MED ORDER — DOCUSATE SODIUM 50 MG/5ML PO LIQD
100.0000 mg | Freq: Two times a day (BID) | ORAL | Status: DC
  Administered 2022-02-20 – 2022-02-24 (×5): 100 mg
  Filled 2022-02-20 (×7): qty 10

## 2022-02-20 MED ORDER — INSULIN ASPART 100 UNIT/ML IJ SOLN
0.0000 [IU] | INTRAMUSCULAR | Status: DC
  Administered 2022-02-24: 2 [IU] via SUBCUTANEOUS
  Filled 2022-02-20: qty 1

## 2022-02-20 MED ORDER — HEPARIN SODIUM (PORCINE) 5000 UNIT/ML IJ SOLN
5000.0000 [IU] | Freq: Three times a day (TID) | INTRAMUSCULAR | Status: DC
  Administered 2022-02-20 – 2022-03-03 (×33): 5000 [IU] via SUBCUTANEOUS
  Filled 2022-02-20 (×33): qty 1

## 2022-02-20 MED ORDER — MIDAZOLAM HCL 2 MG/2ML IJ SOLN
7.5000 mg | Freq: Once | INTRAMUSCULAR | Status: AC
  Administered 2022-02-20: 7.5 mg via INTRAVENOUS

## 2022-02-20 MED ORDER — SODIUM CHLORIDE 0.9 % IV SOLN
3000.0000 mg | Freq: Once | INTRAVENOUS | Status: DC
  Filled 2022-02-20: qty 30

## 2022-02-20 MED ORDER — LEVETIRACETAM IN NACL 1000 MG/100ML IV SOLN
1000.0000 mg | Freq: Two times a day (BID) | INTRAVENOUS | Status: DC
  Administered 2022-02-21 (×2): 1000 mg via INTRAVENOUS
  Filled 2022-02-20 (×3): qty 100

## 2022-02-20 MED ORDER — POLYETHYLENE GLYCOL 3350 17 G PO PACK
17.0000 g | PACK | Freq: Every day | ORAL | Status: DC
  Administered 2022-02-20 – 2022-02-21 (×2): 17 g
  Filled 2022-02-20 (×3): qty 1

## 2022-02-20 MED ORDER — POLYETHYLENE GLYCOL 3350 17 G PO PACK: 17.0000 g | PACK | Freq: Every day | ORAL | Status: DC | PRN

## 2022-02-20 MED ORDER — LACTATED RINGERS IV BOLUS
1000.0000 mL | Freq: Once | INTRAVENOUS | Status: AC
  Administered 2022-02-20: 1000 mL via INTRAVENOUS

## 2022-02-20 MED ORDER — ROCURONIUM BROMIDE 10 MG/ML (PF) SYRINGE
150.0000 mg | PREFILLED_SYRINGE | Freq: Once | INTRAVENOUS | Status: AC
  Administered 2022-02-20: 150 mg via INTRAVENOUS

## 2022-02-20 MED ORDER — THIAMINE HCL 100 MG/ML IJ SOLN
100.0000 mg | INTRAMUSCULAR | Status: DC
  Administered 2022-02-23 – 2022-02-25 (×3): 100 mg via INTRAVENOUS
  Filled 2022-02-20 (×3): qty 2

## 2022-02-20 MED ORDER — STERILE WATER FOR INJECTION IV SOLN
INTRAVENOUS | Status: DC
  Filled 2022-02-20: qty 150

## 2022-02-20 MED ORDER — DOCUSATE SODIUM 100 MG PO CAPS: 100.0000 mg | ORAL_CAPSULE | Freq: Two times a day (BID) | ORAL | Status: DC | PRN

## 2022-02-20 MED ORDER — SODIUM CHLORIDE 0.9 % IV SOLN: 750.0000 mg | Freq: Two times a day (BID) | INTRAVENOUS | Status: DC

## 2022-02-20 MED ORDER — FENTANYL 2500MCG IN NS 250ML (10MCG/ML) PREMIX INFUSION
25.0000 ug/h | INTRAVENOUS | Status: DC
  Administered 2022-02-20: 25 ug/h via INTRAVENOUS
  Administered 2022-02-21: 50 ug/h via INTRAVENOUS
  Filled 2022-02-20 (×2): qty 250

## 2022-02-20 MED ORDER — MIDAZOLAM-SODIUM CHLORIDE 100-0.9 MG/100ML-% IV SOLN
0.5000 mg/h | INTRAVENOUS | Status: DC
  Administered 2022-02-20: 5 mg/h via INTRAVENOUS
  Administered 2022-02-21: 7 mg/h via INTRAVENOUS
  Administered 2022-02-21 (×2): 6 mg/h via INTRAVENOUS
  Filled 2022-02-20 (×4): qty 100

## 2022-02-20 MED ORDER — MIDAZOLAM BOLUS VIA INFUSION
0.0000 mg | INTRAVENOUS | Status: DC | PRN
  Administered 2022-02-20 (×2): 2 mg via INTRAVENOUS

## 2022-02-20 NOTE — ED Provider Notes (Signed)
[facility] Provider Note    Event Date/Time   First MD Initiated Contact with Patient 02/20/22 1534     (approximate)   History   Chief complaint is found down   HPI  John Spears is a 71 y.o. male who is brought in by EMS after having been found by body on the floor unresponsive.  EMS reports 3 seizures with eyes deviated to the right while he is seizing when he is not seizing his eyes are deviated to the left.  He had 1 more seizure here in the emergency department.  He was given Versed IV immediately after the seizure started.  Before the seizure started I was able to examine him he did not have a gag reflex.  He was unresponsive would moan occasionally but not follow commands and not moan in response to a question.  Past medical history as below     Hypomagnesemia     Hypokalemia     Transaminitis     Traumatic rhabdomyolysis (HCC)     Thrombocytopenia (HCC)     Weakness     Lower extremity weakness 03/07/2019   Acute CVA (cerebrovascular accident)      Physical Exam   Triage Vital Signs: ED Triage Vitals  Enc Vitals Group     BP      Pulse      Resp      Temp      Temp src      SpO2      Weight      Height      Head Circumference      Peak Flow      Pain Score      Pain Loc      Pain Edu?      Excl. in GC?     Most recent vital signs: Vitals:   02/20/22 2145 02/20/22 2200  BP: (!) 159/100 (!) 173/111  Pulse: (!) 105 (!) 119  Resp: 15 18  Temp: (!) 97.5 F (36.4 C) (!) 97.5 F (36.4 C)  SpO2: 100% 100%     General: Patient unresponsive Head normocephalic atraumatic Eyes: Pupils are small eyes are deviated to the left when the patient has a seizure the eyes deviated to the right.  They do not cross midline when the patient is not seizing. Mouth no erythema or exudate no gag reflex CV:  Good peripheral perfusion.  Heart regular rate and rhythm no audible murmurs Resp:  Normal effort.  Lungs are clear Abd:  No  distention.  Soft and nontender Extremities no obvious pathology.  Patient's not moving them on   ED Results / Procedures / Treatments   Labs (all labs ordered are listed, but only abnormal results are displayed) Labs Reviewed  ACETAMINOPHEN LEVEL - Abnormal; Notable for the following components:      Result Value   Acetaminophen (Tylenol), Serum <10 (*)    All other components within normal limits  COMPREHENSIVE METABOLIC PANEL - Abnormal; Notable for the following components:   CO2 16 (*)    Glucose, Bld 163 (*)    Total Protein 6.2 (*)    Albumin 3.4 (*)    AST 69 (*)    Anion gap 20 (*)    All other components within normal limits  LACTIC ACID, PLASMA - Abnormal; Notable for the following components:   Lactic Acid, Venous >9.0 (*)    All other components within normal limits  SALICYLATE LEVEL - Abnormal;  Notable for the following components:   Salicylate Lvl <7.0 (*)    All other components within normal limits  CBC WITH DIFFERENTIAL/PLATELET - Abnormal; Notable for the following components:   RBC 2.84 (*)    Hemoglobin 9.6 (*)    HCT 30.8 (*)    MCV 108.5 (*)    RDW 17.8 (*)    nRBC 0.8 (*)    All other components within normal limits  PROTIME-INR - Abnormal; Notable for the following components:   Prothrombin Time 15.5 (*)    All other components within normal limits  APTT - Abnormal; Notable for the following components:   aPTT 21 (*)    All other components within normal limits  URINALYSIS, ROUTINE W REFLEX MICROSCOPIC - Abnormal; Notable for the following components:   Color, Urine YELLOW (*)    APPearance HAZY (*)    Hgb urine dipstick SMALL (*)    Protein, ur 100 (*)    All other components within normal limits  URINE DRUG SCREEN, QUALITATIVE ([facility] ONLY) - Abnormal; Notable for the following components:   Cannabinoid 50 Ng, Ur [place] POSITIVE (*)    Benzodiazepine, Ur Scrn POSITIVE (*)    All other components within normal limits  BLOOD GAS, ARTERIAL -  Abnormal; Notable for the following components:   pCO2 arterial 25 (*)    pO2, Arterial 115 (*)    Bicarbonate 14.5 (*)    Acid-base deficit 9.0 (*)    All other components within normal limits  LACTIC ACID, PLASMA - Abnormal; Notable for the following components:   Lactic Acid, Venous 7.4 (*)    All other components within normal limits  BASIC METABOLIC PANEL - Abnormal; Notable for the following components:   Potassium 5.6 (*)    CO2 21 (*)    Glucose, Bld 113 (*)    Calcium 8.7 (*)    All other components within normal limits  CBG MONITORING, ED - Abnormal; Notable for the following components:   Glucose-Capillary 147 (*)    All other components within normal limits  TROPONIN I (HIGH SENSITIVITY) - Abnormal; Notable for the following components:   Troponin I (High Sensitivity) 27 (*)    All other components within normal limits  TROPONIN I (HIGH SENSITIVITY) - Abnormal; Notable for the following components:   Troponin I (High Sensitivity) 124 (*)    All other components within normal limits  TROPONIN I (HIGH SENSITIVITY) - Abnormal; Notable for the following components:   Troponin I (High Sensitivity) 188 (*)    All other components within normal limits  TROPONIN I (HIGH SENSITIVITY) - Abnormal; Notable for the following components:   Troponin I (High Sensitivity) 291 (*)    All other components within normal limits  MRSA NEXT GEN BY PCR, NASAL  RESP PANEL BY RT-PCR (RSV, FLU A&B, COVID)  RVPGX2  CULTURE, RESPIRATORY W GRAM STAIN  ETHANOL  LIPASE, BLOOD  PROCALCITONIN  GLUCOSE, CAPILLARY  CBC  RENAL FUNCTION PANEL  HIV ANTIBODY (ROUTINE TESTING W REFLEX)  BLOOD GAS, ARTERIAL  PROCALCITONIN  HEMOGLOBIN A1C     EKG  EKG read interpreted by me shows sinus tach at a rate of 110 normal axis no acute ST-T wave changes   RADIOLOGY Chest x-ray done postintubation showed ET tube at the carina.  It was pulled back 2 cm chest x-ray repeated shows ET tube in good  position   PROCEDURES:  Critical Care performed: Medical care time an hour and 10 minutes.  This includes  evaluating the patient initially and intubating him and reviewing his studies and speaking with the neurologist and ICU doctor and the patient's niece and sister.  Additionally I spent at least 5 minutes in direct conflict with the computer which will not allow me to access his note very easily.  This 5 minutes is in addition to the hour and 10 minutes.  Patient with no gag reflex and is obtunded therefore was intubated.  Patient given 7.5 of Versed 2.5 mg at a time IV and then 150 mg of roc as I did not know his weight and estimated.  He was then intubated with a glide scope I watched the tube go through the cords past the balloon ET tube was a 23 at the lips this was put in place and good bilateral breath sounds and on the stomach.  ET tube had to be pulled back afterwards still good bilateral breath sounds. Patient put on a drip for sedation and to treat his seizures and his possible alcohol withdrawal as he has a history alcohol use.  Old records were reviewed.   MEDICATIONS ORDERED IN ED: Medications  midazolam (VERSED) 100 mg/100 mL (1 mg/mL) premix infusion (6 mg/hr Intravenous Rate/Dose Change 02/20/22 2211)  docusate sodium (COLACE) capsule 100 mg (has no administration in time range)  polyethylene glycol (MIRALAX / GLYCOLAX) packet 17 g (has no administration in time range)  heparin injection 5,000 Units (5,000 Units Subcutaneous Given 02/20/22 2234)  pantoprazole (PROTONIX) injection 40 mg (40 mg Intravenous Given 02/20/22 2233)  docusate (COLACE) 50 MG/5ML liquid 100 mg (100 mg Per Tube Given 02/20/22 2233)  polyethylene glycol (MIRALAX / GLYCOLA84[facility]098161[place]09546284[id number] KoNajjarlA84[facility]587016 E098161[place]095462133BalElige KoNajjarlAdal<[facility]84[id number]159152 E. H098161[place]095484[facility] Jo6098161[place]095462132BalElige KoNajjarlAdal<[facility]84[facility]u[id number] KoNajjar84[facility]098161[place]095462135BalEl84[id number][facility] [id number](50Ba84[facility] Ce539255098161[place]095462135BalE84[facility] SurMa62133Balta16109r N84[facility] C098161[place]09546213(8BalElige KoNajjarlAdal<MEALancaster Rehabil84[facility] 98098161[place]095462134BalElige KoNajjarlAMidwest Spec84[facility]098161[place]095462135BalElige KoNajjarlAdal [place] [phone] Adal<METe84[facility]668098161[place]095462137BalEl84[facility] Ce629873098161[place]0984[facility] Ce1272098161[place]09546284[id number] Ce5098161[place]84[facility] Ce284[facility]47696 098161[place]09546213(2BalElige KoNajjarlAdal [place] ScraOr[place] Jua(42[facility]State StrLuciana AxK8484[facility]098161[place]84[facility]098161[place]095462134BalElige KoNajjarlA84[facility] Divi3654 098161[place]095462138BalElige KoNajjarlAdal [place] ScraOrland Jua(31KeState StrLuciana AxKentucky(215EnginHildred Laserngrcyc[address] Surgery Center LLGatesvillFre623-57 aNYL (SUBLIMAZE) bolus via infusion 25-100 mcg (50 mcg Intravenous  Bolus from Bag 02/20/22 1900)  midazolam (VERSED) bolus via infusion 0-5 mg (2 mg Intravenous Bolus from Bag 02/20/22 2213)  levETIRAcetam (KEPPRA) IVPB 1000 mg/100 mL premix (has no administration in time range)  Ampicillin-Sulbactam (UNASYN) 3 g in sodium chloride 0.9 % 100 mL IVPB (3 g Intravenous New Bag/Given 02/20/22 2138)  thiamine (VITAMIN B1) 500 mg in normal saline (50 mL) IVPB (500 mg Intravenous New Bag/Given 02/20/22 2233)  thiamine (VITAMIN B1) injection 100 mg (has no administration in time range)  insulin aspart (novoLOG) injection 0-15 Units (has no administration in time range)  midazolam (VERSED) injection 7.5 mg (7.5 mg Intravenous Given 02/20/22 1547)  rocuronium bromide 10 mg/mL (PF) syringe (150 mg Intravenous Given 02/20/22 1548)  levETIRAcetam (KEPPRA) IVPB 1500 mg/ 100 mL premix (0 mg Intravenous Stopped 02/20/22 1849)    And  levETIRAcetam (KEPPRA) IVPB 1500 mg/ 100 mL premix (0 mg Intravenous Stopped 02/20/22 1815)  lactated ringers bolus 1,000 mL (0 mLs Intravenous Stopped 02/20/22 2132)     IMPRESSION / MDM / ASSESSMENT AND PLAN / ED COURSE  I reviewed the triage vital signs and the nursing notes.  Differential diagnosis includes, but is not limited to, subacute stroke, old stroke, tumor with swelling, trauma the only likely 1 of these is subacute stroke his alcohol problem is a complicating factor  Patient's presentation is most consistent with acute presentation with potential threat to life or bodily function.  The patient is on the cardiac monitor to evaluate for evidence of arrhythmia and/or significant heart rate changes.  None have been seen      FINAL CLINICAL IMPRESSION(S) / ED  DIAGNOSES   Final diagnoses:  Status epilepticus (HCC)  Cerebrovascular accident (CVA), unspecified mechanism (HCC)     Rx / DC Orders   ED Discharge Orders     None        Note:  This document was prepared using Dragon voice recognition software and may  include unintentional dictation errors.   Arnaldo Natal, MD 02/21/22 [phone]

## 2022-02-20 NOTE — Progress Notes (Signed)
eLink Physician-Brief Progress Note Patient Name: John Spears DOB: [birth year] MRN: [id number]   Date of Service  02/20/2022  HPI/Events of Note  Patient with a prior history of CVA admitted with seizures, altered mental status, and acute hypoxemic / hypercapnic respiratory failure, intubated for airway protection and currently mechanically ventilated.  eICU Interventions  New Patient Evaluation.        John Spears 02/20/2022, 7:35 PM

## 2022-02-20 NOTE — ED Triage Notes (Signed)
Pt to ED via [facility]. Pt found down at home by friend. Pt had three seizures with EMS. Hx of HTN. BP 212/124.

## 2022-02-20 NOTE — Progress Notes (Signed)
Attempted to suction for sputum specimen. Unable to obtain adequate sample

## 2022-02-20 NOTE — H&P (Addendum)
NAME:  John Spears, MRN:  [id number], DOB:  07/09/1950, LOS: 0 ADMISSION DATE:  02/20/2022, CONSULTATION DATE:  02/20/2022 REFERRING MD:  Dr. Cinda Quest, CHIEF COMPLAINT:  Unresponsive, Seizures   Brief Pt Description / Synopsis:  71 y.o. Male admitted with Acute Metabolic Encephalopathy in setting of recurrent Seizures requiring intubation for airway protection.  History of Present Illness:  John Spears is a 71 year old male with a past medical history significant for hypertension and alcohol abuse who presented to [facility] ED on 02/20/2022 after being found down and unresponsive by a friend at home.  Patient is currently intubated and sedated, unable to contribute to history, no family is currently available, therefore history is obtained from chart review.  Per ED and nursing notes, the patient's friend contacted EMS as he found the patient down on the floor, unresponsive.  Last known well time is unknown.  According to EMS, the patient had 3 witnessed seizures during which the eyes were deviated to the right.  When not actively seizing his eyes were deviated to the left.  EMS administered Versed en route.  Upon arrival to the ED, the patient was noted to be postictal.  While in the ED he again had another seizure and was unable to protect his airway, therefore he was subsequent intubated for airway protection by ED provider.  ED Course: Initial Vital Signs: Temperature 97.8 F axillary, respiratory rate 23, pulse 141, blood pressure 163/101, SpO2 99% Significant Labs: Bicarb 16, anion gap 20, lactic acid greater than 9, glucose 163, BUN 11, creatinine 1.1, AST 69, high-sensitivity troponin 27, WBC 7.9, hemoglobin 9.6, hematocrit 30.8, MCV 108.5, RDW 17.8, serum Tylenol less than 10, salicylates less than 7, ethyl alcohol less than 10 ABG postintubation: pH 7.37/pCO2 25/pO2 115/bicarb 14.5 Imaging Chest X-ray>>FINDINGS: Tip of endotracheal tube projects 5.5 cm above carina. Normal heart size,  mediastinal contours, and pulmonary vascularity. Lungs clear. No pulmonary infiltrate, pleural effusion, or pneumothorax. CT head>> IMPRESSION: There are no signs of bleeding within the cranium. There is interval appearance of moderate sized area of low-density in the left posterior parietal cortex, possibly old infarct. Less likely possibilities would include subacute infarct or edema surrounding a space-occupying lesion. Follow-up MRI as clinically warranted may be considered. Atrophy.  Small-vessel disease. Chronic sinusitis.  Possible lipoma in the right frontal scalp.  CT Cervical spine>>IMPRESSION: No recent fracture is seen in cervical spine. Cervical spondylosis, more severe at C5-C6 level. Medications Administered: 7.5 mg Versed during intubation  Pertinent  Medical History   Past Medical History:  Diagnosis Date   Hypertension     Micro Data:  12/29: SARS-CoV-2 & Influenza PCR>> 12/29: MRSA PCR>> 12/29: Tracheal aspirate>>  Antimicrobials:  Unasyn 12/29>>  Significant Hospital Events: Including procedures, antibiotic start and stop dates in addition to other pertinent events   12/29: Presented to ED, required intubation for airway protection.  PCCM asked to admit.  Neuro consulted, to place Cerebell  Interim History / Subjective:  -Patient seen and examined in the ED following intubation -Currently sedated on Versed drip -Hemodynamically stable -Evaluated by neurology, recommends cerebell continuous EEG  Objective   Blood pressure (!) 166/109, pulse (!) 105, temperature 97.8 F (36.6 C), temperature source Axillary, resp. rate 20, height _0  (1.803 m), weight 81.6 kg, SpO2 97 %.    Vent Mode: AC FiO2 (%):  [50 %] 50 % Set Rate:  [18 bmp] 18 bmp Vt Set:  [500 mL] 500 mL PEEP:  [5 cmH20] 5 cmH20  No intake  or output data in the 24 hours ending 02/20/22 1832 Filed Weights   02/20/22 1633  Weight: 81.6 kg    Examination: General: Acute on chronically  ill-appearing male, laying in bed, intubated and sedated, no acute distress HENT: Atraumatic, normocephalic, neck supple, no JVD Lungs: Coarse rhonchi throughout, even, nonlabored, synchronous with the vent Cardiovascular: Tachycardia, regular rhythm, S1-S2, no murmurs, rubs or gallops Abdomen: Soft, nontender, nondistended, no guarding room tenderness, bowel sounds positive x 4 Extremities: Normal bulk and tone, no deformities, no edema Neuro: Sedated, does not follow commands, pupils PERRLA GU: Foley catheter in place  Resolved Hospital Problem list     Assessment & Plan:   #Intubated for airway protection  #? Aspiration -Full vent support, implement lung protective strategies -Plateau pressures less than 30 cm H20 -Wean FiO2 & PEEP as tolerated to maintain O2 sats >92% -Follow intermittent Chest X-ray & ABG as needed -Spontaneous Breathing Trials when respiratory parameters met and mental status permits -Implement VAP Bundle -Prn Bronchodilators -Antibiotics as above  #Mildly elevated Troponin, suspect demand ischemia PMHx: Hypertension -Continuous cardiac monitoring -Maintain MAP >65 -IV fluids -Vasopressors as needed to maintain MAP goal -Trend lactic acid until normalized -Trend HS Troponin until peaked  #AG Metabolic Acidosis #Lactic Acidosis, suspect due to recurrent seizures -Monitor I&O's / urinary output -Follow BMP -Ensure adequate renal perfusion -Avoid nephrotoxic agents as able -Replace electrolytes as indicated -IV fluids -Low dose Bicarb gtt -Trend lactic  #Hyperglycemia -CBG's q4h; Target range of 140 to 180 -SSI -Follow ICU Hypo/Hyperglycemia protocol  #Acute Metabolic Encephalopathy in setting of Seizures, ? ETOH withdrawal seizures #Sedation needs in setting of mechanical ventilation #High risk for development of DT's PMHx: ETOH abuse CT head reveals a medium to large sized wedge-shaped hypodensity in the left parietal lobe, most likely  representing a late subacute ischemic infarction. There are no signs of bleeding within the cranium.   -Maintain a RASS goal of 0 to -1 -Versed and Fentanyl as needed to maintain RASS goal -Avoid sedating medications as able -Daily wake up assessment -Serum alcohol less than 10, Tylenol and salicylates negative -UDS positive for marijuana and benzodiazepines -Seizure precautions -CIWA protocol -High-dose thiamine x 3 days, followed by 100 mg thiamine IV daily -Neurology following, appreciate input -Cerebral EEG -Continue Keppra twice daily -Obtain MRI brain when stable       Best Practice (right click and "Reselect all SmartList Selections" daily)   Diet/type: NPO DVT prophylaxis: prophylactic heparin  GI prophylaxis: PPI Lines: N/A Foley:  Yes, and it is still needed Code Status:  full code Last date of multidisciplinary goals of care discussion [N/A]  Labs   CBC: Recent Labs  Lab 02/20/22 1547  WBC 7.9  NEUTROABS 3.5  HGB 9.6*  HCT 30.8*  MCV 108.5*  PLT 700    Basic Metabolic Panel: Recent Labs  Lab 02/20/22 1547  NA 142  K 4.2  CL 106  CO2 16*  GLUCOSE 163*  BUN 11  CREATININE 1.10  CALCIUM 9.3   GFR: Estimated Creatinine Clearance: 67.5 mL/min (by C-G formula based on SCr of 1.1 mg/dL). Recent Labs  Lab 02/20/22 1547 02/20/22 1747  WBC 7.9  --   LATICACIDVEN  --  >9.0*    Liver Function Tests: Recent Labs  Lab 02/20/22 1547  AST 69*  ALT 20  ALKPHOS 63  BILITOT 0.8  PROT 6.2*  ALBUMIN 3.4*   Recent Labs  Lab 02/20/22 1547  LIPASE 38   No results for input(s): "AMMONIA"  in the last 168 hours.  ABG    Component Value Date/Time   PHART 7.37 02/20/2022 1644   PCO2ART 25 (L) 02/20/2022 1644   PO2ART 115 (H) 02/20/2022 1644   HCO3 14.5 (L) 02/20/2022 1644   TCO2 23 10/02/2020 1707   ACIDBASEDEF 9.0 (H) 02/20/2022 1644   O2SAT 99.4 02/20/2022 1644     Coagulation Profile: Recent Labs  Lab 02/20/22 1547  INR 1.2     Cardiac Enzymes: No results for input(s): "CKTOTAL", "CKMB", "CKMBINDEX", "TROPONINI" in the last 168 hours.  HbA1C: Hgb A1c MFr Bld  Date/Time Value Ref Range Status  03/08/2019 12:34 PM 5.0 4.8 - 5.6 % Final    Comment:    (NOTE) Pre diabetes:          5.7%-6.4% Diabetes:              >6.4% Glycemic control for   <7.0% adults with diabetes   04/06/2016 04:43 AM 5.1 4.8 - 5.6 % Final    Comment:    (NOTE)         Pre-diabetes: 5.7 - 6.4         Diabetes: >6.4         Glycemic control for adults with diabetes: <7.0     CBG: Recent Labs  Lab 02/20/22 1550  GLUCAP 147*    Review of Systems:   Unable to assess due to intubation/sedation/critical illness   Past Medical History:  He,  has a past medical history of Hypertension.   Surgical History:   Past Surgical History:  Procedure Laterality Date   JOINT REPLACEMENT       Social History:   reports that he has quit smoking. He has never used smokeless tobacco. He reports current alcohol use of about 28.0 standard drinks of alcohol per week. He reports current drug use. Drug: Marijuana.   Family History:  His family history is not on file.   Allergies No Known Allergies   Home Medications  Prior to Admission medications   Medication Sig Start Date End Date Taking? Authorizing Provider  acetaminophen (TYLENOL) 325 MG tablet Take 2 tablets (650 mg total) by mouth every 6 (six) hours as needed for mild pain or fever (or Fever >/= 101). 03/14/19   Wyvonnia Dusky, MD  amLODipine (NORVASC) 10 MG tablet Take 1 tablet (10 mg total) by mouth daily. 04/04/19 05/04/19  Lavonia Drafts, MD  aspirin EC 81 MG EC tablet Take 1 tablet (81 mg total) by mouth daily. 03/15/19   Wyvonnia Dusky, MD  famotidine (PEPCID) 20 MG tablet Take 1 tablet (20 mg total) by mouth 2 (two) times daily. 12/04/20   Carrie Mew, MD  hydrALAZINE (APRESOLINE) 50 MG tablet Take 1 tablet (50 mg total) by mouth every 8 (eight) hours.  04/04/19 05/04/19  Lavonia Drafts, MD  hydrochlorothiazide (HYDRODIURIL) 25 MG tablet Take 25 mg by mouth daily. 04/08/20   [provider]  metoprolol succinate (TOPROL-XL) 25 MG 24 hr tablet Take 1 tablet (25 mg total) by mouth daily. 04/04/19 05/04/19  Lavonia Drafts, MD  potassium chloride (KLOR-CON) 10 MEQ tablet Take 10 mEq by mouth daily. 06/05/20   [provider]     Critical care time: 55 minutes     Darel Hong, AGACNP-BC [facility] epic messenger for cross cover needs If after hours, please call E-link

## 2022-02-20 NOTE — ED Notes (Signed)
Darnelle Catalan, MD, made aware of critical lactic >9

## 2022-02-20 NOTE — Consult Note (Signed)
NEURO HOSPITALIST CONSULT NOTE   Requesting physician: Dr. Darnelle Catalan  Reason for Consult: Complex partial status epilepticus  History obtained from:  Chart     HPI:                                                                                                                                          John Spears is an 71 y.o. male with a history of HTN and high alcohol intake who was brought in by EMS after being found down and unresponsive at home by a friend. He had 3 seizures with EMS during which eyes were deviated to the right. When not actively seizing his eyes were deviated to the left. EMS administered Versed en route. BP 212/124. Patient was postictal with eyes deviated to the left on arrival to the ED. ED staff then witnessed another seizure, with arms flexed and shaking, eyes deviated to the right. On exam by EDP after the last seizure, he would moan occasionally but did not follow commands. Gag reflex was absent. He was intubated for airway protection and received 7.5 mg Versed IV during intubation. After intubation, he was started on Versed drip at 5 mg/hr.   Past Medical History:  Diagnosis Date   Hypertension     Past Surgical History:  Procedure Laterality Date   JOINT REPLACEMENT      History reviewed. No pertinent family history.           Social History:  reports that he has quit smoking. He has never used smokeless tobacco. He reports current alcohol use of about 28.0 standard drinks of alcohol per week. He reports current drug use. Drug: Marijuana.  No Known Allergies  HOME MEDICATIONS:                                                                                                                      No current facility-administered medications on file prior to encounter.   Current Outpatient Medications on File Prior to Encounter  Medication Sig Dispense Refill   acetaminophen (TYLENOL) 325 MG tablet Take 2 tablets (650 mg total) by mouth  every 6 (six) hours as needed for mild pain or fever (or Fever >/= 101).     amLODipine (NORVASC) 10 MG tablet Take 1  tablet (10 mg total) by mouth daily. 30 tablet 0   aspirin EC 81 MG EC tablet Take 1 tablet (81 mg total) by mouth daily.     famotidine (PEPCID) 20 MG tablet Take 1 tablet (20 mg total) by mouth 2 (two) times daily. 60 tablet 0   hydrALAZINE (APRESOLINE) 50 MG tablet Take 1 tablet (50 mg total) by mouth every 8 (eight) hours. 90 tablet 0   hydrochlorothiazide (HYDRODIURIL) 25 MG tablet Take 25 mg by mouth daily.     metoprolol succinate (TOPROL-XL) 25 MG 24 hr tablet Take 1 tablet (25 mg total) by mouth daily. 30 tablet 0   potassium chloride (KLOR-CON) 10 MEQ tablet Take 10 mEq by mouth daily.       ROS:                                                                                                                                       Unable to obtain due to intubation/sedation/postictal state.    Blood pressure (!) 170/109, pulse 97, temperature 97.8 F (36.6 C), temperature source Axillary, resp. rate (!) 22, height  (1.803 m), weight 81.6 kg, SpO2 100 %.   General Examination:                                                                                                       Physical Exam  HEENT-  Goessel/AT. No neck stiffness.  Lungs- Intubated Extremities- No edema   Neurological Examination Mental Status:  Intubated and sedated on 5 mg/hr Versed. It has been > 1 hour since paralytic was administered for intubation. No responses to external stimuli. No eye opening. No attempts to communicate.  Cranial Nerves: II: No blink to threat. Pupils equal and small without any definite reactivity.    III,IV, VI: No doll's eye reflex. Eyes are midline. No nystagmus.  V,VII: No corneal reflex bilaterally VIII: No response to voice or loud clapping IX,X: Intubated XI: Flaccid tone of neck musculature.  XII: Unable to assess Motor: Flaccid tone x 4 without  asymmetry. No movement of limbs to noxious stimuli or spontaneously.  Sensory: Will shake head weakly to and fro with noxious pinch to BLE and RUE, but not LUE Deep Tendon Reflexes: Hypoactive throughout Plantars: Mute bilaterally Cerebellar/Gait: Unable to assess    Lab Results: Basic Metabolic Panel: Recent Labs  Lab 02/20/22 1547  NA 142  K 4.2  CL 106  CO2 16*  GLUCOSE 163*  BUN 11  CREATININE 1.10  CALCIUM 9.3    CBC: Recent Labs  Lab 02/20/22 1547  WBC 7.9  NEUTROABS 3.5  HGB 9.6*  HCT 30.8*  MCV 108.5*  PLT 201    Cardiac Enzymes: No results for input(s): "CKTOTAL", "CKMB", "CKMBINDEX", "TROPONINI" in the last 168 hours.  Lipid Panel: No results for input(s): "CHOL", "TRIG", "HDL", "CHOLHDL", "VLDL", "LDLCALC" in the last 168 hours.  Imaging: DG Chest Portable 1 View  Result Date: 02/20/2022 CLINICAL DATA:  Respiratory failure EXAM: PORTABLE CHEST 1 VIEW COMPARISON:  3:49 p.m. FINDINGS: Endotracheal tube is seen 5 cm above the carina. Nasogastric tube extends into the expected mid body of the stomach. Lung volumes are small, but are symmetric and are clear. Pulmonary insufflation is stable since prior examination. No pneumothorax or pleural effusion. Cardiac size within normal limits. Pulmonary vascularity is normal. IMPRESSION: 1. Support tubes in appropriate position. 2. Pulmonary hypoinflation. Electronically Signed   By: Helyn Numbers M.D.   On: 02/20/2022 16:55   CT Cervical Spine Wo Contrast  Result Date: 02/20/2022 CLINICAL DATA:  Trauma, found down EXAM: CT CERVICAL SPINE WITHOUT CONTRAST TECHNIQUE: Multidetector CT imaging of the cervical spine was performed without intravenous contrast. Multiplanar CT image reconstructions were also generated. RADIATION DOSE REDUCTION: This exam was performed according to the departmental dose-optimization program which includes automated exposure control, adjustment of the mA and/or kV according to patient size  and/or use of iterative reconstruction technique. COMPARISON:  03/07/2019 FINDINGS: Alignment: Alignment of posterior margins of vertebral bodies appears normal. Skull base and vertebrae: No recent fracture is seen. Degenerative changes are noted, more severe at C5-C6 level. Soft tissues and spinal canal: There is no central spinal stenosis. There is extrinsic pressure over the ventral margin of thecal sac caused by bulging of annulus and bony spurs from C3-C7 levels. There is mild to moderate spinal stenosis at C5-C6 level. Disc levels: There is encroachment of neural foramina by bony spurs from C3-C7 levels. Upper chest: Unremarkable. Other: Endotracheal and enteric tube are noted in place. IMPRESSION: No recent fracture is seen in cervical spine. Cervical spondylosis, more severe at C5-C6 level. Electronically Signed   By: Ernie Avena M.D.   On: 02/20/2022 16:35   CT Head Wo Contrast  Result Date: 02/20/2022 CLINICAL DATA:  Found down, altered mental status EXAM: CT HEAD WITHOUT CONTRAST TECHNIQUE: Contiguous axial images were obtained from the base of the skull through the vertex without intravenous contrast. RADIATION DOSE REDUCTION: This exam was performed according to the departmental dose-optimization program which includes automated exposure control, adjustment of the mA and/or kV according to patient size and/or use of iterative reconstruction technique. COMPARISON:  Previous studies done on 10/02/2020 FINDINGS: Brain: There are no signs of bleeding within the cranium. There is interval appearance of moderate sized area of low-density in left posterior temporoparietal cortex. There is no effacement of adjacent cortical sulci or any significant mass effect. Cortical sulci are prominent. There is decreased density in periventricular white matter. Vascular: Unremarkable. Skull: No fracture is seen in calvarium. There is 1.3 cm area of focal thickening in the soft tissues in right frontal scalp  with no significant interval change suggesting possible chronic benign process. Density measurements are suggestive of possible lipoma. Sinuses/Orbits: There is mucosal thickening in ethmoid sinus. Frontal sinus is hypoplastic. Other: Orogastric tube is noted in place. IMPRESSION: There are no signs of bleeding within the cranium. There is interval appearance of moderate sized area of low-density in the left posterior parietal cortex,  possibly old infarct. Less likely possibilities would include subacute infarct or edema surrounding a space-occupying lesion. Follow-up MRI as clinically warranted may be considered. Atrophy.  Small-vessel disease. Chronic sinusitis.  Possible lipoma in the right frontal scalp. Electronically Signed   By: Ernie Avena M.D.   On: 02/20/2022 16:30   DG Chest Portable 1 View  Result Date: 02/20/2022 CLINICAL DATA:  Post intubation EXAM: PORTABLE CHEST 1 VIEW COMPARISON:  Portable exam 1549 hours compared to 04/04/2019 FINDINGS: Tip of endotracheal tube projects 5.5 cm above carina. Normal heart size, mediastinal contours, and pulmonary vascularity. Lungs clear. No pulmonary infiltrate, pleural effusion, or pneumothorax. Osseous structures unremarkable. IMPRESSION: No acute abnormalities. Electronically Signed   By: Ulyses Southward M.D.   On: 02/20/2022 15:59    Assessment: 71 year old male with a history of HTN who was brought in by EMS after being found down and unresponsive at home by a friend. He had 3 seizures with EMS during which eyes were deviated to the right. When not actively seizing his eyes were deviated to the left. EMS administered Versed en route. BP 212/124. Patient was postictal with eyes deviated to the left on arrival to the ED. ED staff then witnessed another seizure, with arms flexed and shaking, eyes deviated to the right. On exam by EDP after the last seizure, he would moan occasionally but did not follow commands. Gag reflex was absent. He was intubated  for airway protection and received 7.5 mg Versed IV during intubation. After intubation, he was started on Versed drip at 5 mg/hr.  - CT head reveals a medium to large sized wedge-shaped hypodensity in the left parietal lobe, most likely representing a late subacute ischemic infarction. There are no signs of bleeding within the cranium.  - Exam reveals a sedated and intubated patient with flaccid limbs and head shaking in response to noxious stimuli to BLE and RUE, but not LUE. Absent corneal and oculocephalic reflexes. No clinical seizure activity seen.  - Overall impression: Complex partial status epilepticus. Semiology most consistent with complex partial seizure. Seizure focus most likely in the left cerebral hemisphere. Seizure activity now clinically resolved.   Recommendations: - Has been loaded with Keppra 3000 mg IV. Start scheduled Keppra dosing at 1000 mg IV BID.  - Magnesium level - EtOH level - UDS - CIWA protocol/EtOH withdrawal precautions - Inpatient seizure precautions - STAT Ceribell EEG - MRI brain when stable.  - Continue Versed gtt at 5 mg/hr  45 minutes spent in the emergent neurological evaluation and management of this critically ill patient.    Electronically signed: Dr. Caryl Pina 02/20/2022, 5:35 PM

## 2022-02-20 NOTE — ED Notes (Signed)
Darnelle Catalan, MD, made aware of troponin 124

## 2022-02-20 NOTE — ED Notes (Addendum)
Verbal orders from Dr Darnelle Catalan to give 7.5mg  versed and roc 150mg  prior to intubation which was completed.

## 2022-02-20 NOTE — Progress Notes (Signed)
Pharmacy Antibiotic Note  John Spears is a 71 y.o. male admitted on 02/20/2022 with  aspiration pneumonia .  Pharmacy has been consulted for Unasyn dosing.  Plan: Unasyn 3g IV q6h  Height: 5\' 11"  (180.3 cm) Weight: 81.6 kg (180 lb) IBW/kg (Calculated) : 75.3  Temp (24hrs), Avg:97.8 F (36.6 C), Min:97.8 F (36.6 C), Max:97.8 F (36.6 C)  Recent Labs  Lab 02/20/22 1547 02/20/22 1747  WBC 7.9  --   CREATININE 1.10  --   LATICACIDVEN  --  >9.0*    Estimated Creatinine Clearance: 67.5 mL/min (by C-G formula based on SCr of 1.1 mg/dL).    No Known Allergies  Antimicrobials this admission: Unasyn 12/29 >>    Dose adjustments this admission:  Microbiology results:   Thank you for allowing pharmacy to be a part of this patient's care.  1/30, PharmD, BCPS 02/20/2022 7:49 PM

## 2022-02-21 ENCOUNTER — Inpatient Hospital Stay: Payer: 59

## 2022-02-21 DIAGNOSIS — I639 Cerebral infarction, unspecified: Secondary | ICD-10-CM

## 2022-02-21 DIAGNOSIS — G40901 Epilepsy, unspecified, not intractable, with status epilepticus: Secondary | ICD-10-CM | POA: Diagnosis not present

## 2022-02-21 DIAGNOSIS — R569 Unspecified convulsions: Secondary | ICD-10-CM | POA: Diagnosis not present

## 2022-02-21 LAB — RESPIRATORY PANEL BY PCR

## 2022-02-21 LAB — BLOOD GAS, ARTERIAL
Acid-Base Excess: 0.1 mmol/L (ref 0.0–2.0)
Bicarbonate: 24.7 mmol/L (ref 20.0–28.0)
FIO2: 28 %
MECHVT: 500 mL
Mechanical Rate: 18
O2 Saturation: 99.7 %
PEEP: 5 cmH2O
Patient temperature: 37
pCO2 arterial: 39 mmHg (ref 32–48)
pH, Arterial: 7.41 (ref 7.35–7.45)
pO2, Arterial: 142 mmHg — ABNORMAL HIGH (ref 83–108)

## 2022-02-21 LAB — GLUCOSE, CAPILLARY
Glucose-Capillary: 120 mg/dL — ABNORMAL HIGH (ref 70–99)
Glucose-Capillary: 62 mg/dL — ABNORMAL LOW (ref 70–99)
Glucose-Capillary: 68 mg/dL — ABNORMAL LOW (ref 70–99)
Glucose-Capillary: 70 mg/dL (ref 70–99)
Glucose-Capillary: 78 mg/dL (ref 70–99)
Glucose-Capillary: 84 mg/dL (ref 70–99)
Glucose-Capillary: 92 mg/dL (ref 70–99)

## 2022-02-21 LAB — CBC
HCT: 27.8 % — ABNORMAL LOW (ref 39.0–52.0)
Hemoglobin: 9.4 g/dL — ABNORMAL LOW (ref 13.0–17.0)
MCH: 33.5 pg (ref 26.0–34.0)
MCHC: 33.8 g/dL (ref 30.0–36.0)
MCV: 98.9 fL (ref 80.0–100.0)
Platelets: 142 10*3/uL — ABNORMAL LOW (ref 150–400)
RBC: 2.81 MIL/uL — ABNORMAL LOW (ref 4.22–5.81)
RDW: 17.9 % — ABNORMAL HIGH (ref 11.5–15.5)
WBC: 6.5 10*3/uL (ref 4.0–10.5)
nRBC: 0.3 % — ABNORMAL HIGH (ref 0.0–0.2)

## 2022-02-21 LAB — RENAL FUNCTION PANEL
Albumin: 2.6 g/dL — ABNORMAL LOW (ref 3.5–5.0)
Anion gap: 11 (ref 5–15)
BUN: 11 mg/dL (ref 8–23)
CO2: 21 mmol/L — ABNORMAL LOW (ref 22–32)
Calcium: 8.5 mg/dL — ABNORMAL LOW (ref 8.9–10.3)
Chloride: 107 mmol/L (ref 98–111)
Creatinine, Ser: 0.93 mg/dL (ref 0.61–1.24)
GFR, Estimated: >60 mL/min (ref >60)
Glucose, Bld: 97 mg/dL (ref 70–99)
Phosphorus: 3.7 mg/dL (ref 2.5–4.6)
Potassium: 4 mmol/L (ref 3.5–5.1)
Sodium: 139 mmol/L (ref 135–145)

## 2022-02-21 LAB — RESP PANEL BY RT-PCR (RSV, FLU A&B, COVID)  RVPGX2
Influenza A by PCR: NEGATIVE
Influenza B by PCR: NEGATIVE
Resp Syncytial Virus by PCR: NEGATIVE
SARS Coronavirus 2 by RT PCR: NEGATIVE

## 2022-02-21 LAB — TROPONIN I (HIGH SENSITIVITY)
Troponin I (High Sensitivity): 268 ng/L (ref <18)
Troponin I (High Sensitivity): 258 ng/L (ref <18)

## 2022-02-21 LAB — MAGNESIUM: Magnesium: 1.7 mg/dL (ref 1.7–2.4)

## 2022-02-21 LAB — PROCALCITONIN: Procalcitonin: 0.65 ng/mL

## 2022-02-21 LAB — LACTIC ACID, PLASMA
Lactic Acid, Venous: 2.6 mmol/L (ref 0.5–1.9)
Lactic Acid, Venous: 2.4 mmol/L (ref 0.5–1.9)

## 2022-02-21 LAB — HIV ANTIBODY (ROUTINE TESTING W REFLEX): HIV Screen 4th Generation wRfx: NONREACTIVE

## 2022-02-21 MED ORDER — DEXTROSE 50 % IV SOLN
INTRAVENOUS | Status: AC
  Administered 2022-02-21: 12.5 g via INTRAVENOUS
  Filled 2022-02-21: qty 50

## 2022-02-21 MED ORDER — DEXTROSE IN LACTATED RINGERS 5 % IV SOLN: INTRAVENOUS | Status: DC

## 2022-02-21 MED ORDER — ORAL CARE MOUTH RINSE
15.0000 mL | OROMUCOSAL | Status: DC
  Administered 2022-02-21 – 2022-02-22 (×17): 15 mL via OROMUCOSAL

## 2022-02-21 MED ORDER — LORAZEPAM 2 MG/ML IJ SOLN
1.0000 mg | Freq: Once | INTRAMUSCULAR | Status: DC
  Filled 2022-02-21: qty 1

## 2022-02-21 MED ORDER — CHLORHEXIDINE GLUCONATE CLOTH 2 % EX PADS
6.0000 | MEDICATED_PAD | Freq: Every day | CUTANEOUS | Status: DC
  Administered 2022-02-21 – 2022-03-03 (×9): 6 via TOPICAL

## 2022-02-21 MED ORDER — ORAL CARE MOUTH RINSE: 15.0000 mL | OROMUCOSAL | Status: DC | PRN

## 2022-02-21 MED ORDER — DEXTROSE 50 % IV SOLN: 12.5000 g | INTRAVENOUS | Status: AC

## 2022-02-21 MED ORDER — MAGNESIUM SULFATE 4 GM/100ML IV SOLN
4.0000 g | Freq: Once | INTRAVENOUS | Status: AC
  Administered 2022-02-21: 4 g via INTRAVENOUS
  Filled 2022-02-21: qty 100

## 2022-02-21 NOTE — Progress Notes (Signed)
Subjective: No clinical seizure activity overnight  Objective: Current vital signs: BP 114/73   Pulse 73   Temp (!) 97.3 F (36.3 C)   Resp 18   Ht 5\' 11"  (1.803 m)   Wt 80.9 kg   SpO2 100%   BMI 24.88 kg/m  Vital signs in last 24 hours: Temp:  [97.3 F (36.3 C)-98.1 F (36.7 C)] 97.3 F (36.3 C) (12/30 0930) Pulse Rate:  [73-141] 73 (12/30 0930) Resp:  [13-29] 18 (12/30 0930) BP: (89-175)/(62-115) 114/73 (12/30 0930) SpO2:  [89 %-100 %] 100 % (12/30 0930) FiO2 (%):  [24 %-50 %] 24 % (12/30 0800) Weight:  [80.9 kg-81.6 kg] 80.9 kg (12/30 0500)  Intake/Output from previous day: 12/29 0701 - 12/30 0700 In: 618.7 [I.V.:168.7; IV Piggyback:450] Out: 510 [Urine:510] Intake/Output this shift: No intake/output data recorded. Nutritional status:  Diet Order             Diet NPO time specified  Diet effective now                  Physical Exam  HEENT-  [place]/AT. No neck stiffness.  Lungs- Intubated Extremities- No edema    Neurological Examination Mental Status:  Intubated and sedated on 5 mg/hr Versed and fentanyl at a rate of 50. No cortically-mediated responses to external stimuli. No eye opening. No attempts to communicate.  Cranial Nerves: II: No blink to threat. Pupils pinpoint without any definite reactivity in the context of fentanyl sedation.    III,IV, VI: No doll's eye reflex. Eyes are midline. No nystagmus.  V,VII: Weak corneal reflexes bilaterally VIII: No response to voice or loud clapping IX,X: Intubated XI: Flaccid tone of neck musculature.  XII: Unable to assess Motor/Sensory: Flaccid tone x 4 without asymmetry. No movement of limbs to noxious stimuli or spontaneously.  Deep Tendon Reflexes: 2+ bilateral brachioradialis and patellae Plantars: Upgoing bilaterally Cerebellar/Gait: Unable to assess  Lab Results: Results for orders placed or performed during the hospital encounter of 02/20/22 (from the past 48 hour(s))  Acetaminophen level      Status: Abnormal   Collection Time: 02/20/22  3:47 PM  Result Value Ref Range   Acetaminophen (Tylenol), Serum <10 (L) 10 - 30 ug/mL    Comment: (NOTE) Therapeutic concentrations vary significantly. A range of 10-30 ug/mL  may be an effective concentration for many patients. However, some  are best treated at concentrations outside of this range. Acetaminophen concentrations >150 ug/mL at 4 hours after ingestion  and >50 ug/mL at 12 hours after ingestion are often associated with  toxic reactions.  Performed at [facility], [address] [place], [place] [address]   Comprehensive metabolic panel     Status: Abnormal   Collection Time: 02/20/22  3:47 PM  Result Value Ref Range   Sodium 142 135 - 145 mmol/L   Potassium 4.2 3.5 - 5.1 mmol/L   Chloride 106 98 - 111 mmol/L   CO2 16 (L) 22 - 32 mmol/L   Glucose, Bld 163 (H) 70 - 99 mg/dL    Comment: Glucose reference range applies only to samples taken after fasting for at least 8 hours.   BUN 11 8 - 23 mg/dL   Creatinine, Ser 1.10 0.61 - 1.24 mg/dL   Calcium 9.3 8.9 - 10.3 mg/dL   Total Protein 6.2 (L) 6.5 - 8.1 g/dL   Albumin 3.4 (L) 3.5 - 5.0 g/dL   AST 69 (H) 15 - 41 U/L   ALT 20 0 -  44 U/L   Alkaline Phosphatase 63 38 - 126 U/L   Total Bilirubin 0.8 0.3 - 1.2 mg/dL   GFR, Estimated >60 >60 mL/min    Comment: (NOTE) Calculated using the CKD-EPI Creatinine Equation (2021)    Anion gap 20 (H) 5 - 15    Comment: Performed at [facility], [address] [place], [place] [address]  Ethanol     Status: None   Collection Time: 02/20/22  3:47 PM  Result Value Ref Range   Alcohol, Ethyl (B) <10 <10 mg/dL    Comment: (NOTE) Lowest detectable limit for serum alcohol is 10 mg/dL.  For medical purposes only. Performed at [facility], [address] [place], [place] [address]   Lipase, blood     Status: None   Collection Time: 02/20/22  3:47 PM  Result Value Ref Range   Lipase 38 11 - 51 U/L     Comment: Performed at [facility], [address] [place], [place] [id number]  Salicylate level     Status: Abnormal   Collection Time: 02/20/22  3:47 PM  Result Value Ref Range   Salicylate Lvl Q000111Q (L) 7.0 - 30.0 mg/dL    Comment: Performed at [facility], [place], [place] [address]  Troponin I (High Sensitivity)     Status: Abnormal   Collection Time: 02/20/22  3:47 PM  Result Value Ref Range   Troponin I (High Sensitivity) 27 (H) <18 ng/L    Comment: (NOTE) Elevated high sensitivity troponin I (hsTnI) values and significant  changes across serial measurements may suggest ACS but many other  chronic and acute conditions are known to elevate hsTnI results.  Refer to the "Links" section for chest pain algorithms and additional  guidance. Performed at [facility], [address] [place], [place] [address]   CBC with Differential     Status: Abnormal   Collection Time: 02/20/22  3:47 PM  Result Value Ref Range   WBC 7.9 4.0 - 10.5 K/uL   RBC 2.84 (L) 4.22 - 5.81 MIL/uL   Hemoglobin 9.6 (L) 13.0 - 17.0 g/dL   HCT 30.8 (L) 39.0 - 52.0 %   MCV 108.5 (H) 80.0 - 100.0 fL   MCH 33.8 26.0 - 34.0 pg   MCHC 31.2 30.0 - 36.0 g/dL   RDW 17.8 (H) 11.5 - 15.5 %   Platelets 201 150 - 400 K/uL   nRBC 0.8 (H) 0.0 - 0.2 %   Neutrophils Relative % 44 %   Neutro Abs 3.5 1.7 - 7.7 K/uL   Lymphocytes Relative 43 %   Lymphs Abs 3.4 0.7 - 4.0 K/uL   Monocytes Relative 11 %   Monocytes Absolute 0.9 0.1 - 1.0 K/uL   Eosinophils Relative 0 %   Eosinophils Absolute 0.0 0.0 - 0.5 K/uL   Basophils Relative 1 %   Basophils Absolute 0.1 0.0 - 0.1 K/uL   Immature Granulocytes 1 %   Abs Immature Granulocytes 0.05 0.00 - 0.07 K/uL    Comment: Performed at [facility], [address] [place], [place] [address]  Protime-INR     Status: Abnormal   Collection Time: 02/20/22  3:47 PM  Result Value Ref Range   Prothrombin Time 15.5 (H) 11.4 -  15.2 seconds   INR 1.2 0.8 - 1.2    Comment: (NOTE) INR goal varies based on device and disease states. Performed at [facility], [address], [place], [place] [address]  APTT     Status: Abnormal   Collection Time: 02/20/22  3:47 PM  Result Value Ref Range   aPTT 21 (L) 24 - 36 seconds    Comment: Performed at [facility], [address] [place], [place] [address]  POC CBG, ED     Status: Abnormal   Collection Time: 02/20/22  3:50 PM  Result Value Ref Range   Glucose-Capillary 147 (H) 70 - 99 mg/dL    Comment: Glucose reference range applies only to samples taken after fasting for at least 8 hours.  Blood gas, arterial     Status: Abnormal   Collection Time: 02/20/22  4:44 PM  Result Value Ref Range   FIO2 28 %   Delivery systems VENTILATOR    MECHVT 500 mL   PEEP 5 cm H20   pH, Arterial 7.37 7.35 - 7.45   pCO2 arterial 25 (L) 32 - 48 mmHg   pO2, Arterial 115 (H) 83 - 108 mmHg   Bicarbonate 14.5 (L) 20.0 - 28.0 mmol/L   Acid-base deficit 9.0 (H) 0.0 - 2.0 mmol/L   O2 Saturation 99.4 %   Patient temperature 37.0    Collection site RIGHT RADIAL    Allens test (pass/fail) PASS PASS   Mechanical Rate 18     Comment: Performed at [facility], [address] [place], [place] [address]  Urinalysis, Routine w reflex microscopic Urine, Catheterized     Status: Abnormal   Collection Time: 02/20/22  4:52 PM  Result Value Ref Range   Color, Urine YELLOW (A) YELLOW   APPearance HAZY (A) CLEAR   Specific Gravity, Urine 1.014 1.005 - 1.030   pH 5.0 5.0 - 8.0   Glucose, UA NEGATIVE NEGATIVE mg/dL   Hgb urine dipstick SMALL (A) NEGATIVE   Bilirubin Urine NEGATIVE NEGATIVE   Ketones, ur NEGATIVE NEGATIVE mg/dL   Protein, ur 100 (A) NEGATIVE mg/dL   Nitrite NEGATIVE NEGATIVE   Leukocytes,Ua NEGATIVE NEGATIVE   RBC / HPF 6-10 0 - 5 RBC/hpf   WBC, UA 6-10 0 - 5 WBC/hpf   Bacteria, UA NONE SEEN NONE SEEN   Squamous Epithelial / LPF 0-5 0 - 5 /HPF    Mucus PRESENT    Hyaline Casts, UA PRESENT     Comment: Performed at [facility], [address] [place], [place] [address]  Urine Drug Screen, Qualitative     Status: Abnormal   Collection Time: 02/20/22  4:52 PM  Result Value Ref Range   Tricyclic, Ur Screen NONE DETECTED NONE DETECTED   Amphetamines, Ur Screen NONE DETECTED NONE DETECTED   MDMA (Ecstasy)Ur Screen NONE DETECTED NONE DETECTED   Cocaine Metabolite,Ur [place] NONE DETECTED NONE DETECTED   Opiate, Ur Screen NONE DETECTED NONE DETECTED   Phencyclidine (PCP) Ur S NONE DETECTED NONE DETECTED   Cannabinoid 50 Ng, Ur  POSITIVE (A) NONE DETECTED   Barbiturates, Ur Screen NONE DETECTED NONE DETECTED   Benzodiazepine, Ur Scrn POSITIVE (A) NONE DETECTED   Methadone Scn, Ur NONE DETECTED NONE DETECTED    Comment: (NOTE) Tricyclics + metabolites, urine    Cutoff 1000 ng/mL Amphetamines + metabolites, urine  Cutoff 1000 ng/mL MDMA (Ecstasy), urine              Cutoff 500 ng/mL Cocaine Metabolite, urine          Cutoff 300 ng/mL Opiate + metabolites, urine        Cutoff 300 ng/mL Phencyclidine (PCP), urine  Cutoff 25 ng/mL Cannabinoid, urine                 Cutoff 50 ng/mL Barbiturates + metabolites, urine  Cutoff 200 ng/mL Benzodiazepine, urine              Cutoff 200 ng/mL Methadone, urine                   Cutoff 300 ng/mL  The urine drug screen provides only a preliminary, unconfirmed analytical test result and should not be used for non-medical purposes. Clinical consideration and professional judgment should be applied to any positive drug screen result due to possible interfering substances. A more specific alternate chemical method must be used in order to obtain a confirmed analytical result. Gas chromatography / mass spectrometry (GC/MS) is the preferred confirm atory method. Performed at [facility], [address] [place], [place] [address]   Procalcitonin - Baseline     Status: None    Collection Time: 02/20/22  5:46 PM  Result Value Ref Range   Procalcitonin 0.11 ng/mL    Comment:        Interpretation: PCT (Procalcitonin) <= 0.5 ng/mL: Systemic infection (sepsis) is not likely. Local bacterial infection is possible. (NOTE)       Sepsis PCT Algorithm           Lower Respiratory Tract                                      Infection PCT Algorithm    ----------------------------     ----------------------------         PCT < 0.25 ng/mL                PCT < 0.10 ng/mL          Strongly encourage             Strongly discourage   discontinuation of antibiotics    initiation of antibiotics    ----------------------------     -----------------------------       PCT 0.25 - 0.50 ng/mL            PCT 0.10 - 0.25 ng/mL               OR       >80% decrease in PCT            Discourage initiation of                                            antibiotics      Encourage discontinuation           of antibiotics    ----------------------------     -----------------------------         PCT >= 0.50 ng/mL              PCT 0.26 - 0.50 ng/mL               AND        <80% decrease in PCT             Encourage initiation of  antibiotics       Encourage continuation           of antibiotics    ----------------------------     -----------------------------        PCT >= 0.50 ng/mL                  PCT > 0.50 ng/mL               AND         increase in PCT                  Strongly encourage                                      initiation of antibiotics    Strongly encourage escalation           of antibiotics                                     -----------------------------                                           PCT <= 0.25 ng/mL                                                 OR                                        > 80% decrease in PCT                                      Discontinue / Do not initiate                                              antibiotics  Performed at [facility], [address] [place], [place] [address]   Lactic acid, plasma     Status: Abnormal   Collection Time: 02/20/22  5:47 PM  Result Value Ref Range   Lactic Acid, Venous >9.0 (HH) 0.5 - 1.9 mmol/L    Comment: CRITICAL RESULT CALLED TO, READ BACK BY AND VERIFIED WITH LISA GEISLER 02/20/22 @ 1819 BY SB Performed at [facility], [address] [place], [place] [address]   Troponin I (High Sensitivity)     Status: Abnormal   Collection Time: 02/20/22  5:47 PM  Result Value Ref Range   Troponin I (High Sensitivity) 124 (HH) <18 ng/L    Comment: CRITICAL RESULT CALLED TO, READ BACK BY AND VERIFIED WITH LISA GEISLER 02/20/22 @ 1825 by sb (NOTE) Elevated high sensitivity troponin I (hsTnI) values and significant  changes across serial measurements may suggest ACS but many other  chronic and acute conditions are known to elevate hsTnI results.  Refer  to the "Links" section for chest pain algorithms and additional  guidance. Performed at [facility], [address] [place], [place] [address]   MRSA Next Gen by PCR, Nasal     Status: None   Collection Time: 02/20/22  7:23 PM   Specimen: Nasal Mucosa; Nasal Swab  Result Value Ref Range   MRSA by PCR Next Gen NOT DETECTED NOT DETECTED    Comment: (NOTE) The GeneXpert MRSA Assay (FDA approved for NASAL specimens only), is one component of a comprehensive MRSA colonization surveillance program. It is not intended to diagnose MRSA infection nor to guide or monitor treatment for MRSA infections. Test performance is not FDA approved in patients less than 21 years old. Performed at [facility], [address] [place], [place] [address]   Lactic acid, plasma     Status: Abnormal   Collection Time: 02/20/22  7:59 PM  Result Value Ref Range   Lactic Acid, Venous 7.4 (HH) 0.5 - 1.9 mmol/L    Comment: CRITICAL VALUE NOTED. VALUE IS CONSISTENT  WITH PREVIOUSLY REPORTED/CALLED VALUE SRR Performed at [facility], [address] [place], [place] [address]   HIV Antibody (routine testing w rflx)     Status: None   Collection Time: 02/20/22  7:59 PM  Result Value Ref Range   HIV Screen 4th Generation wRfx Non Reactive Non Reactive    Comment: Performed at [facility], [place] [address], [place], [place] [id number]  Basic metabolic panel     Status: Abnormal   Collection Time: 02/20/22  8:39 PM  Result Value Ref Range   Sodium 140 135 - 145 mmol/L   Potassium 5.6 (H) 3.5 - 5.1 mmol/L    Comment: HEMOLYSIS AT THIS LEVEL MAY AFFECT RESULT   Chloride 106 98 - 111 mmol/L   CO2 21 (L) 22 - 32 mmol/L   Glucose, Bld 113 (H) 70 - 99 mg/dL    Comment: Glucose reference range applies only to samples taken after fasting for at least 8 hours.   BUN 11 8 - 23 mg/dL   Creatinine, Ser 0.98 0.61 - 1.24 mg/dL   Calcium 8.7 (L) 8.9 - 10.3 mg/dL   GFR, Estimated >60 >60 mL/min    Comment: (NOTE) Calculated using the CKD-EPI Creatinine Equation (2021)    Anion gap 13 5 - 15    Comment: Performed at [facility], [address] [place], [place] [address]  Troponin I (High Sensitivity)     Status: Abnormal   Collection Time: 02/20/22  8:39 PM  Result Value Ref Range   Troponin I (High Sensitivity) 188 (HH) <18 ng/L    Comment: CRITICAL VALUE NOTED. VALUE IS CONSISTENT WITH PREVIOUSLY REPORTED/CALLED VALUE SRR (NOTE) Elevated high sensitivity troponin I (hsTnI) values and significant  changes across serial measurements may suggest ACS but many other  chronic and acute conditions are known to elevate hsTnI results.  Refer to the "Links" section for chest pain algorithms and additional  guidance. Performed at [facility], [place], [place] [address]   Troponin I (High Sensitivity)     Status: Abnormal   Collection Time: 02/20/22 10:45 PM  Result Value Ref Range   Troponin I (High Sensitivity)  291 (HH) <18 ng/L    Comment: CRITICAL VALUE NOTED. VALUE IS CONSISTENT WITH PREVIOUSLY REPORTED/CALLED VALUE DLB (NOTE) Elevated high sensitivity troponin I (hsTnI) values and significant  changes across serial measurements may suggest ACS but many other  chronic and acute conditions are known to elevate hsTnI results.  Refer to the "Links" section for chest pain algorithms and additional  guidance. Performed at [facility], [address] [place], [place] [address]   Glucose, capillary     Status: None   Collection Time: 02/20/22 11:25 PM  Result Value Ref Range   Glucose-Capillary 77 70 - 99 mg/dL    Comment: Glucose reference range applies only to samples taken after fasting for at least 8 hours.  Glucose, capillary     Status: None   Collection Time: 02/21/22  4:16 AM  Result Value Ref Range   Glucose-Capillary 92 70 - 99 mg/dL    Comment: Glucose reference range applies only to samples taken after fasting for at least 8 hours.  CBC     Status: Abnormal   Collection Time: 02/21/22  4:18 AM  Result Value Ref Range   WBC 6.5 4.0 - 10.5 K/uL   RBC 2.81 (L) 4.22 - 5.81 MIL/uL   Hemoglobin 9.4 (L) 13.0 - 17.0 g/dL   HCT 19.127.8 (L) 47.839.0 - 29.552.0 %   MCV 98.9 80.0 - 100.0 fL    Comment: REPEATED TO VERIFY   MCH 33.5 26.0 - 34.0 pg   MCHC 33.8 30.0 - 36.0 g/dL   RDW 62.117.9 (H) 30.811.5 - 65.715.5 %   Platelets 142 (L) 150 - 400 K/uL   nRBC 0.3 (H) 0.0 - 0.2 %    Comment: Performed at [facility], [address] [place], [place] [address]  Renal function panel     Status: Abnormal   Collection Time: 02/21/22  4:18 AM  Result Value Ref Range   Sodium 139 135 - 145 mmol/L   Potassium 4.0 3.5 - 5.1 mmol/L   Chloride 107 98 - 111 mmol/L   CO2 21 (L) 22 - 32 mmol/L   Glucose, Bld 97 70 - 99 mg/dL    Comment: Glucose reference range applies only to samples taken after fasting for at least 8 hours.   BUN 11 8 - 23 mg/dL   Creatinine, Ser 2.950.93 0.61 - 1.24 mg/dL   Calcium  8.5 (L) 8.9 - 10.3 mg/dL   Phosphorus 3.7 2.5 - 4.6 mg/dL   Albumin 2.6 (L) 3.5 - 5.0 g/dL   GFR, Estimated >28>60 >41>60 mL/min    Comment: (NOTE) Calculated using the CKD-EPI Creatinine Equation (2021)    Anion gap 11 5 - 15    Comment: Performed at [facility], [address] [place], [place] [address]  Procalcitonin     Status: None   Collection Time: 02/21/22  4:18 AM  Result Value Ref Range   Procalcitonin 0.65 ng/mL    Comment:        Interpretation: PCT > 0.5 ng/mL and <= 2 ng/mL: Systemic infection (sepsis) is possible, but other conditions are known to elevate PCT as well. (NOTE)       Sepsis PCT Algorithm           Lower Respiratory Tract                                      Infection PCT Algorithm    ----------------------------     ----------------------------         PCT < 0.25 ng/mL                PCT < 0.10 ng/mL  Strongly encourage             Strongly discourage   discontinuation of antibiotics    initiation of antibiotics    ----------------------------     -----------------------------       PCT 0.25 - 0.50 ng/mL            PCT 0.10 - 0.25 ng/mL               OR       >80% decrease in PCT            Discourage initiation of                                            antibiotics      Encourage discontinuation           of antibiotics    ----------------------------     -----------------------------         PCT >= 0.50 ng/mL              PCT 0.26 - 0.50 ng/mL                AND       <80% decrease in PCT             Encourage initiation of                                             antibiotics       Encourage continuation           of antibiotics    ----------------------------     -----------------------------        PCT >= 0.50 ng/mL                  PCT > 0.50 ng/mL               AND         increase in PCT                  Strongly encourage                                      initiation of antibiotics    Strongly encourage  escalation           of antibiotics                                     -----------------------------                                           PCT <= 0.25 ng/mL                                                 OR                                        >  80% decrease in PCT                                      Discontinue / Do not initiate                                             antibiotics  Performed at [facility], [address] [place], [place] [address]   Blood gas, arterial     Status: Abnormal   Collection Time: 02/21/22  5:00 AM  Result Value Ref Range   FIO2 28 %   Delivery systems VENTILATOR    Mode PRESSURE REGULATED VOLUME CONTROL    MECHVT 500 mL   PEEP 5 cm H20   pH, Arterial 7.41 7.35 - 7.45   pCO2 arterial 39 32 - 48 mmHg   pO2, Arterial 142 (H) 83 - 108 mmHg   Bicarbonate 24.7 20.0 - 28.0 mmol/L   Acid-Base Excess 0.1 0.0 - 2.0 mmol/L   O2 Saturation 99.7 %   Patient temperature 37.0    Collection site RIGHT RADIAL    Drawn by [employer] AND CLOUDY    Allens test (pass/fail) PASS PASS   Mechanical Rate 18     Comment: Performed at [facility], [address] [place], [place] [address]  Glucose, capillary     Status: None   Collection Time: 02/21/22  7:46 AM  Result Value Ref Range   Glucose-Capillary 78 70 - 99 mg/dL    Comment: Glucose reference range applies only to samples taken after fasting for at least 8 hours.  Resp panel by RT-PCR (RSV, Flu A&B, Covid) Anterior Nasal Swab     Status: None   Collection Time: 02/21/22  8:30 AM   Specimen: Anterior Nasal Swab  Result Value Ref Range   SARS Coronavirus 2 by RT PCR NEGATIVE NEGATIVE    Comment: (NOTE) SARS-CoV-2 target nucleic acids are NOT DETECTED.  The SARS-CoV-2 RNA is generally detectable in upper respiratory specimens during the acute phase of infection. The lowest concentration of SARS-CoV-2 viral copies this assay can detect is 138 copies/mL. A negative result does not  preclude SARS-Cov-2 infection and should not be used as the sole basis for treatment or other patient management decisions. A negative result may occur with  improper specimen collection/handling, submission of specimen other than nasopharyngeal swab, presence of viral mutation(s) within the areas targeted by this assay, and inadequate number of viral copies(<138 copies/mL). A negative result must be combined with clinical observations, patient history, and epidemiological information. The expected result is Negative.  Fact Sheet for Patients:  EntrepreneurPulse.com.au  Fact Sheet for Healthcare Providers:  IncredibleEmployment.be  This test is no t yet approved or cleared by the [place] FDA and  has been authorized for detection and/or diagnosis of SARS-CoV-2 by FDA under an Emergency Use Authorization (EUA). This EUA will remain  in effect (meaning this test can be used) for the duration of the COVID-19 declaration under Section 564(b)(1) of the Act, 21 U.S.C.section 360bbb-3(b)(1), unless the authorization is terminated  or revoked sooner.       Influenza A by PCR NEGATIVE NEGATIVE   Influenza B by PCR NEGATIVE NEGATIVE    Comment: (NOTE) The Xpert Xpress SARS-CoV-2/FLU/RSV plus assay is intended as an aid in  the diagnosis of influenza from Nasopharyngeal swab specimens and should not be used as a sole basis for treatment. Nasal washings and aspirates are unacceptable for Xpert Xpress SARS-CoV-2/FLU/RSV testing.  Fact Sheet for Patients: EntrepreneurPulse.com.au  Fact Sheet for Healthcare Providers: IncredibleEmployment.be  This test is not yet approved or cleared by the [place] FDA and has been authorized for detection and/or diagnosis of SARS-CoV-2 by FDA under an Emergency Use Authorization (EUA). This EUA will remain in effect (meaning this test can be used) for the duration of  the COVID-19 declaration under Section 564(b)(1) of the Act, 21 U.S.C. section 360bbb-3(b)(1), unless the authorization is terminated or revoked.     Resp Syncytial Virus by PCR NEGATIVE NEGATIVE    Comment: (NOTE) Fact Sheet for Patients: EntrepreneurPulse.com.au  Fact Sheet for Healthcare Providers: IncredibleEmployment.be  This test is not yet approved or cleared by the [place] FDA and has been authorized for detection and/or diagnosis of SARS-CoV-2 by FDA under an Emergency Use Authorization (EUA). This EUA will remain in effect (meaning this test can be used) for the duration of the COVID-19 declaration under Section 564(b)(1) of the Act, 21 U.S.C. section 360bbb-3(b)(1), unless the authorization is terminated or revoked.  Performed at [facility] [facility], [address] [place], [place] [address]     Recent Results (from the past 240 hour(s))  MRSA Next Gen by PCR, Nasal     Status: None   Collection Time: 02/20/22  7:23 PM   Specimen: Nasal Mucosa; Nasal Swab  Result Value Ref Range Status   MRSA by PCR Next Gen NOT DETECTED NOT DETECTED Final    Comment: (NOTE) The GeneXpert MRSA Assay (FDA approved for NASAL specimens only), is one component of a comprehensive MRSA colonization surveillance program. It is not intended to diagnose MRSA infection nor to guide or monitor treatment for MRSA infections. Test performance is not FDA approved in patients less than 7 years old. Performed at [facility], [address] [place], [place] [address]   Resp panel by RT-PCR (RSV, Flu A&B, Covid) Anterior Nasal Swab     Status: None   Collection Time: 02/21/22  8:30 AM   Specimen: Anterior Nasal Swab  Result Value Ref Range Status   SARS Coronavirus 2 by RT PCR NEGATIVE NEGATIVE Final    Comment: (NOTE) SARS-CoV-2 target nucleic acids are NOT DETECTED.  The SARS-CoV-2 RNA is generally detectable in upper  respiratory specimens during the acute phase of infection. The lowest concentration of SARS-CoV-2 viral copies this assay can detect is 138 copies/mL. A negative result does not preclude SARS-Cov-2 infection and should not be used as the sole basis for treatment or other patient management decisions. A negative result may occur with  improper specimen collection/handling, submission of specimen other than nasopharyngeal swab, presence of viral mutation(s) within the areas targeted by this assay, and inadequate number of viral copies(<138 copies/mL). A negative result must be combined with clinical observations, patient history, and epidemiological information. The expected result is Negative.  Fact Sheet for Patients:  EntrepreneurPulse.com.au  Fact Sheet for Healthcare Providers:  IncredibleEmployment.be  This test is no t yet approved or cleared by the [place] FDA and  has been authorized for detection and/or diagnosis of SARS-CoV-2 by FDA under an Emergency Use Authorization (EUA). This EUA will remain  in effect (meaning this test can be used) for the duration of the COVID-19 declaration under Section 564(b)(1) of the Act, 21 U.S.C.section 360bbb-3(b)(1), unless the authorization is terminated  or revoked sooner.       Influenza A by PCR NEGATIVE NEGATIVE Final   Influenza B by PCR NEGATIVE NEGATIVE Final    Comment: (NOTE) The Xpert Xpress SARS-CoV-2/FLU/RSV plus assay is intended as an aid in the diagnosis of influenza from Nasopharyngeal swab specimens and should not be used as a sole basis for treatment. Nasal washings and aspirates are unacceptable for Xpert Xpress SARS-CoV-2/FLU/RSV testing.  Fact Sheet for Patients: BloggerCourse.com  Fact Sheet for Healthcare Providers: SeriousBroker.it  This test is not yet approved or cleared by the [place] FDA and has been  authorized for detection and/or diagnosis of SARS-CoV-2 by FDA under an Emergency Use Authorization (EUA). This EUA will remain in effect (meaning this test can be used) for the duration of the COVID-19 declaration under Section 564(b)(1) of the Act, 21 U.S.C. section 360bbb-3(b)(1), unless the authorization is terminated or revoked.     Resp Syncytial Virus by PCR NEGATIVE NEGATIVE Final    Comment: (NOTE) Fact Sheet for Patients: BloggerCourse.com  Fact Sheet for Healthcare Providers: SeriousBroker.it  This test is not yet approved or cleared by the [place] FDA and has been authorized for detection and/or diagnosis of SARS-CoV-2 by FDA under an Emergency Use Authorization (EUA). This EUA will remain in effect (meaning this test can be used) for the duration of the COVID-19 declaration under Section 564(b)(1) of the Act, 21 U.S.C. section 360bbb-3(b)(1), unless the authorization is terminated or revoked.  Performed at [facility], [address] [place], [place] [address]     Lipid Panel No results for input(s): "CHOL", "TRIG", "HDL", "CHOLHDL", "VLDL", "LDLCALC" in the last 72 hours.  Studies/Results: DG Chest Port 1 View  Result Date: 02/21/2022 CLINICAL DATA:  Acute respiratory failure with hypoxia. EXAM: PORTABLE CHEST 1 VIEW COMPARISON:  01/31/2022 FINDINGS: Endotracheal tube is 7.2 centimeters above the carina. Enteric tube is in place, tip to the stomach. Heart size is normal. No focal consolidations. Minimal bibasilar subsegmental atelectasis. IMPRESSION: 1. Endotracheal tube is 7.2 centimeters above the carina. 2. Minimal bibasilar subsegmental atelectasis. Electronically Signed   By: Norva Pavlov M.D.   On: 02/21/2022 08:25   Rapid EEG  Result Date: 02/21/2022 Charlsie Quest, MD     02/21/2022  8:11 AM Patient Name: Essex Haba MRN: [id number] Epilepsy Attending: Charlsie Quest Referring  Physician/Provider: Arnaldo Natal, MD Duration: 02/20/2022 2053 to 02/21/2022 0800 Patient history: 71yo M with ams in the setting of seizures. EEG to evaluate for seizure. Level of alertness: comatose AEDs during EEG study: LEV, versed Technical aspects: This EEG was obtained using a 10 lead EEG system positioned circumferentially without any parasagittal coverage (rapid EEG). Computer selected EEG is reviewed as  well as background features and all clinically significant events. Description: EEG showed continuous generalized 3 to 6 Hz theta-delta slowing with overriding 15 to 18 Hz beta activity distributed symmetrically and diffusely. Hyperventilation and photic stimulation were not performed. Of note, parts of study were technically difficult due to significant electrode artifact.   ABNORMALITY - Continuous slow, generalized IMPRESSION: This limited ceribell study is suggestive of moderate to severe diffuse encephalopathy, nonspecific etiology but could be secondary to sedation. No seizures or epileptiform discharges were seen throughout the recording. Charlsie Quest   DG Chest Portable 1 View  Result Date: 02/20/2022 CLINICAL DATA:  Respiratory failure EXAM: PORTABLE CHEST 1 VIEW COMPARISON:  3:49 p.m. FINDINGS: Endotracheal tube is seen 5 cm above the carina. Nasogastric tube extends into the  expected mid body of the stomach. Lung volumes are small, but are symmetric and are clear. Pulmonary insufflation is stable since prior examination. No pneumothorax or pleural effusion. Cardiac size within normal limits. Pulmonary vascularity is normal. IMPRESSION: 1. Support tubes in appropriate position. 2. Pulmonary hypoinflation. Electronically Signed   By: Fidela Salisbury M.D.   On: 02/20/2022 16:55   CT Cervical Spine Wo Contrast  Result Date: 02/20/2022 CLINICAL DATA:  Trauma, found down EXAM: CT CERVICAL SPINE WITHOUT CONTRAST TECHNIQUE: Multidetector CT imaging of the cervical spine was performed  without intravenous contrast. Multiplanar CT image reconstructions were also generated. RADIATION DOSE REDUCTION: This exam was performed according to the departmental dose-optimization program which includes automated exposure control, adjustment of the mA and/or kV according to patient size and/or use of iterative reconstruction technique. COMPARISON:  03/07/2019 FINDINGS: Alignment: Alignment of posterior margins of vertebral bodies appears normal. Skull base and vertebrae: No recent fracture is seen. Degenerative changes are noted, more severe at C5-C6 level. Soft tissues and spinal canal: There is no central spinal stenosis. There is extrinsic pressure over the ventral margin of thecal sac caused by bulging of annulus and bony spurs from C3-C7 levels. There is mild to moderate spinal stenosis at C5-C6 level. Disc levels: There is encroachment of neural foramina by bony spurs from C3-C7 levels. Upper chest: Unremarkable. Other: Endotracheal and enteric tube are noted in place. IMPRESSION: No recent fracture is seen in cervical spine. Cervical spondylosis, more severe at C5-C6 level. Electronically Signed   By: Elmer Picker M.D.   On: 02/20/2022 16:35   CT Head Wo Contrast  Result Date: 02/20/2022 CLINICAL DATA:  Found down, altered mental status EXAM: CT HEAD WITHOUT CONTRAST TECHNIQUE: Contiguous axial images were obtained from the base of the skull through the vertex without intravenous contrast. RADIATION DOSE REDUCTION: This exam was performed according to the departmental dose-optimization program which includes automated exposure control, adjustment of the mA and/or kV according to patient size and/or use of iterative reconstruction technique. COMPARISON:  Previous studies done on 10/02/2020 FINDINGS: Brain: There are no signs of bleeding within the cranium. There is interval appearance of moderate sized area of low-density in left posterior temporoparietal cortex. There is no effacement of  adjacent cortical sulci or any significant mass effect. Cortical sulci are prominent. There is decreased density in periventricular white matter. Vascular: Unremarkable. Skull: No fracture is seen in calvarium. There is 1.3 cm area of focal thickening in the soft tissues in right frontal scalp with no significant interval change suggesting possible chronic benign process. Density measurements are suggestive of possible lipoma. Sinuses/Orbits: There is mucosal thickening in ethmoid sinus. Frontal sinus is hypoplastic. Other: Orogastric tube is noted in place. IMPRESSION: There are no signs of bleeding within the cranium. There is interval appearance of moderate sized area of low-density in the left posterior parietal cortex, possibly old infarct. Less likely possibilities would include subacute infarct or edema surrounding a space-occupying lesion. Follow-up MRI as clinically warranted may be considered. Atrophy.  Small-vessel disease. Chronic sinusitis.  Possible lipoma in the right frontal scalp. Electronically Signed   By: Elmer Picker M.D.   On: 02/20/2022 16:30   DG Chest Portable 1 View  Result Date: 02/20/2022 CLINICAL DATA:  Post intubation EXAM: PORTABLE CHEST 1 VIEW COMPARISON:  Portable exam 1549 hours compared to 04/04/2019 FINDINGS: Tip of endotracheal tube projects 5.5 cm above carina. Normal heart size, mediastinal contours, and pulmonary vascularity. Lungs clear. No pulmonary infiltrate, pleural effusion, or pneumothorax.  Osseous structures unremarkable. IMPRESSION: No acute abnormalities. Electronically Signed   By: Lavonia Dana M.D.   On: 02/20/2022 15:59    Medications: Scheduled:  Chlorhexidine Gluconate Cloth  6 each Topical Daily   docusate  100 mg Per Tube BID   heparin  5,000 Units Subcutaneous Q8H   insulin aspart  0-15 Units Subcutaneous Q4H   mouth rinse  15 mL Mouth Rinse Q2H   pantoprazole (PROTONIX) IV  40 mg Intravenous QHS   polyethylene glycol  17 g Per Tube Daily    [START ON 02/23/2022] thiamine (VITAMIN B1) injection  100 mg Intravenous Q24H   Continuous:  ampicillin-sulbactam (UNASYN) IV 3 g (02/21/22 [id number])   fentaNYL infusion INTRAVENOUS 50 mcg/hr (02/21/22 0600)   levETIRAcetam     magnesium sulfate bolus IVPB     midazolam 7 mg/hr (02/21/22 [id number])   thiamine (VITAMIN B1) injection Stopped (02/20/22 2303)    Assessment: 71 year old male with a history of HTN and heavy EtOH use who was brought in by EMS after being found down and unresponsive at home by a friend. He had 3 seizures with EMS during which eyes were deviated to the right. When not actively seizing his eyes were deviated to the left. EMS administered Versed en route. BP 212/124. Patient was postictal with eyes deviated to the left on arrival to the ED. ED staff then witnessed another seizure, with arms flexed and shaking, eyes deviated to the right. On exam by EDP after the last seizure, he would moan occasionally but did not follow commands. Gag reflex was absent. He was intubated for airway protection and received 7.5 mg Versed IV during intubation. After intubation, he was started on Versed drip at 5 mg/hr.  - CT head reveals a medium to large sized wedge-shaped hypodensity in the left parietal lobe, most likely representing a late subacute ischemic infarction. There are no signs of bleeding within the cranium.  - Ceribell EEG report 02/20/2022 2053 to 02/21/2022 0800: Continuous generalized slowing. This limited ceribell study is suggestive of moderate to severe diffuse encephalopathy, nonspecific to etiology but could be secondary to sedation. No seizures or epileptiform discharges were seen throughout the recording. - Exam reveals a sedated and intubated patient with flaccid limbs. Has weak corneal reflexes but no oculocephalic reflexes and pupils are also pinpoint in the context of Versed and fentanyl sedation. No clinical seizure activity seen.  - EtOH level < 10 - UDS was positive for  cannabinoid and benzodiazepine - Overall impression: Complex partial status epilepticus, now resolved. Seizure focus most likely in the left cerebral hemisphere. Possibly precipitated by EtOH withdrawal     Recommendations: - Continue scheduled Keppra dosing at 1000 mg IV BID. Consider weaning off when extubated and fully awake for 24 hours afterwards with no further seizure activity.  - Magnesium level (ordered) - CIWA protocol/EtOH withdrawal precautions - Inpatient seizure precautions - Continue Ceribell EEG for now. Can discontinue when off sedation, extubated and seizure free both clinically and electrographically - MRI brain when stable.  - Agree with CCM regarding goal of weaning off sedation and extubating today.   35 minutes spent in the neurological evaluation and management of this critically ill patient   LOS: 1 day   @Electronically  signed: Dr. Kerney Elbe 02/21/2022  10:13 AM

## 2022-02-21 NOTE — Consult Note (Signed)
PHARMACY CONSULT NOTE - FOLLOW UP  Pharmacy Consult for Electrolyte Monitoring and Replacement   Recent Labs: Potassium (mmol/L)  Date Value  02/21/2022 4.0   Magnesium (mg/dL)  Date Value  56/31/4970 1.3 (L)   Calcium (mg/dL)  Date Value  26/37/8588 8.5 (L)   Albumin (g/dL)  Date Value  50/27/7412 2.6 (L)   Phosphorus (mg/dL)  Date Value  87/86/7672 3.7   Sodium (mmol/L)  Date Value  02/21/2022 139     Assessment: 71 y.o. Male admitted with Acute Metabolic Encephalopathy in setting of recurrent Seizures requiring intubation for airway protection. Currently on unasyn for PNA.   Goal of Therapy:  WNL  Plan:  Mg 4 g IV x 1  F/u with AM labs.   Ronnald Ramp ,PharmD Clinical Pharmacist 02/21/2022 7:46 AM

## 2022-02-21 NOTE — Progress Notes (Signed)
ETT advanced 2 cm to 23 @ lip due to cuff leak.  Leak was resolved with reposition of ETT

## 2022-02-21 NOTE — Progress Notes (Signed)
NAME:  John Spears, MRN:  [id number], DOB:  11/23/1950, LOS: 1 ADMISSION DATE:  02/20/2022, CONSULTATION DATE:  02/20/2022 REFERRING MD:  Dr. Cinda Quest, CHIEF COMPLAINT:  Unresponsive, Seizures   Brief Pt Description / Synopsis:  71 y.o. Male admitted with Acute Metabolic Encephalopathy in setting of recurrent Seizures requiring intubation for airway protection.  History of Present Illness:  John Spears is a 70 year old male with a past medical history significant for hypertension and alcohol abuse who presented to [facility] ED on 02/20/2022 after being found down and unresponsive by a friend at home.  Patient is currently intubated and sedated, unable to contribute to history, no family is currently available, therefore history is obtained from chart review.  Per ED and nursing notes, the patient's friend contacted EMS as he found the patient down on the floor, unresponsive.  Last known well time is unknown.  According to EMS, the patient had 3 witnessed seizures during which the eyes were deviated to the right.  When not actively seizing his eyes were deviated to the left.  EMS administered Versed en route.  Upon arrival to the ED, the patient was noted to be postictal.  While in the ED he again had another seizure and was unable to protect his airway, therefore he was subsequent intubated for airway protection by ED provider.  ED Course: Initial Vital Signs: Temperature 97.8 F axillary, respiratory rate 23, pulse 141, blood pressure 163/101, SpO2 99% Significant Labs: Bicarb 16, anion gap 20, lactic acid greater than 9, glucose 163, BUN 11, creatinine 1.1, AST 69, high-sensitivity troponin 27, WBC 7.9, hemoglobin 9.6, hematocrit 30.8, MCV 108.5, RDW 17.8, serum Tylenol less than 10, salicylates less than 7, ethyl alcohol less than 10 ABG postintubation: pH 7.37/pCO2 25/pO2 115/bicarb 14.5 Imaging Chest X-ray>>FINDINGS: Tip of endotracheal tube projects 5.5 cm above carina. Normal heart size,  mediastinal contours, and pulmonary vascularity. Lungs clear. No pulmonary infiltrate, pleural effusion, or pneumothorax. CT head>> IMPRESSION: There are no signs of bleeding within the cranium. There is interval appearance of moderate sized area of low-density in the left posterior parietal cortex, possibly old infarct. Less likely possibilities would include subacute infarct or edema surrounding a space-occupying lesion. Follow-up MRI as clinically warranted may be considered. Atrophy.  Small-vessel disease. Chronic sinusitis.  Possible lipoma in the right frontal scalp.  CT Cervical spine>>IMPRESSION: No recent fracture is seen in cervical spine. Cervical spondylosis, more severe at C5-C6 level. Medications Administered: 7.5 mg Versed during intubation  Pertinent  Medical History   Past Medical History:  Diagnosis Date   Hypertension     Micro Data:  12/29: SARS-CoV-2 & Influenza PCR>> 12/29: MRSA PCR>> 12/29: Tracheal aspirate>>  Antimicrobials:  Unasyn 12/29>>  Significant Hospital Events: Including procedures, antibiotic start and stop dates in addition to other pertinent events   12/29: Presented to ED, required intubation for airway protection.  PCCM asked to admit.  Neuro consulted, to place Cerebell 02/21/22- patient remains on keppra intubated.  He was seen by neurology and there is plan for MRI. I met with family today and reviewed medical plan answered questions.   Objective   Blood pressure 111/82, pulse 77, temperature 97.7 F (36.5 C), resp. rate 18, height _0  (1.803 m), weight 80.9 kg, SpO2 100 %.    Vent Mode: PRVC FiO2 (%):  [24 %-50 %] 24 % Set Rate:  [18 bmp] 18 bmp Vt Set:  [500 mL] 500 mL PEEP:  [5 cmH20] 5 cmH20 Plateau Pressure:  [15 cmH20-16 cmH20]  15 cmH20   Intake/Output Summary (Last 24 hours) at 02/21/2022 9629 Last data filed at 02/21/2022 0600 Gross per 24 hour  Intake 618.69 ml  Output 510 ml  Net 108.69 ml   Filed Weights    02/20/22 1633 02/21/22 0500  Weight: 81.6 kg 80.9 kg    Examination: General: Acute on chronically ill-appearing male, laying in bed, intubated and sedated, no acute distress HENT: Atraumatic, normocephalic, neck supple, no JVD Lungs: Coarse rhonchi throughout, even, nonlabored, synchronous with the vent Cardiovascular: Tachycardia, regular rhythm, S1-S2, no murmurs, rubs or gallops Abdomen: Soft, nontender, nondistended, no guarding room tenderness, bowel sounds positive x 4 Extremities: Normal bulk and tone, no deformities, no edema Neuro: Sedated, does not follow commands, pupils PERRLA GU: Foley catheter in place  Resolved Hospital Problem list     Assessment & Plan:   #Intubated for airway protection  #? Aspiration -Full vent support, implement lung protective strategies -Plateau pressures less than 30 cm H20 -Wean FiO2 & PEEP as tolerated to maintain O2 sats >92% -Follow intermittent Chest X-ray & ABG as needed -Spontaneous Breathing Trials when respiratory parameters met and mental status permits -Implement VAP Bundle -Prn Bronchodilators -Antibiotics as above- on UNASYN  #Mildly elevated Troponin, suspect demand ischemia PMHx: Hypertension -Continuous cardiac monitoring -Maintain MAP >65 -IV fluids -Vasopressors as needed to maintain MAP goal -Trend lactic acid until normalized -Trend HS Troponin until peaked   #AG Metabolic Acidosis #Lactic Acidosis, suspect due to recurrent seizures -Monitor I&O's / urinary output -Follow BMP -Ensure adequate renal perfusion -Avoid nephrotoxic agents as able -Replace electrolytes as indicated -IV fluids -Low dose Bicarb gtt -Trend lactic  #Hyperglycemia -CBG's q4h; Target range of 140 to 180 -SSI -Follow ICU Hypo/Hyperglycemia protocol  #Acute Metabolic Encephalopathy in setting of Seizures, ? ETOH withdrawal seizures #Sedation needs in setting of mechanical ventilation #High risk for development of DT's PMHx: ETOH  abuse CT head reveals a medium to large sized wedge-shaped hypodensity in the left parietal lobe, most likely representing a late subacute ischemic infarction. There are no signs of bleeding within the cranium.   -Maintain a RASS goal of 0 to -1 -Versed and Fentanyl as needed to maintain RASS goal -Avoid sedating medications as able -Daily wake up assessment -Serum alcohol less than 10, Tylenol and salicylates negative -UDS positive for marijuana and benzodiazepines -Seizure precautions -CIWA protocol -High-dose thiamine x 3 days, followed by 100 mg thiamine IV daily -Neurology following, appreciate input- Dr Cheral Marker -Cerebral EEG -Continue Keppra twice daily -Obtain MRI brain when more stable       Best Practice (right click and "Reselect all SmartList Selections" daily)   Diet/type: NPO DVT prophylaxis: prophylactic heparin  GI prophylaxis: PPI Lines: N/A Foley:  Yes, and it is still needed Code Status:  full code Last date of multidisciplinary goals of care discussion [N/A]  Labs   CBC: Recent Labs  Lab 02/20/22 1547 02/21/22 0418  WBC 7.9 6.5  NEUTROABS 3.5  --   HGB 9.6* 9.4*  HCT 30.8* 27.8*  MCV 108.5* 98.9  PLT 201 142*     Basic Metabolic Panel: Recent Labs  Lab 02/20/22 1547 02/20/22 2039 02/21/22 0418  NA 142 140 139  K 4.2 5.6* 4.0  CL 106 106 107  CO2 16* 21* 21*  GLUCOSE 163* 113* 97  BUN _0 CREATININE 1.10 0.98 0.93  CALCIUM 9.3 8.7* 8.5*  PHOS  --   --  3.7    GFR: Estimated Creatinine Clearance: 79.8 mL/min (  by C-G formula based on SCr of 0.93 mg/dL). Recent Labs  Lab 02/20/22 1547 02/20/22 1746 02/20/22 1747 02/20/22 1959 02/21/22 0418  PROCALCITON  --  0.11  --   --  0.65  WBC 7.9  --   --   --  6.5  LATICACIDVEN  --   --  >9.0* 7.4*  --      Liver Function Tests: Recent Labs  Lab 02/20/22 1547 02/21/22 0418  AST 69*  --   ALT 20  --   ALKPHOS 63  --   BILITOT 0.8  --   PROT 6.2*  --   ALBUMIN 3.4* 2.6*     Recent Labs  Lab 02/20/22 1547  LIPASE 38    No results for input(s): "AMMONIA" in the last 168 hours.  ABG    Component Value Date/Time   PHART 7.41 02/21/2022 0500   PCO2ART 39 02/21/2022 0500   PO2ART 142 (H) 02/21/2022 0500   HCO3 24.7 02/21/2022 0500   TCO2 23 10/02/2020 1707   ACIDBASEDEF 9.0 (H) 02/20/2022 1644   O2SAT 99.7 02/21/2022 0500     Coagulation Profile: Recent Labs  Lab 02/20/22 1547  INR 1.2     Cardiac Enzymes: No results for input(s): "CKTOTAL", "CKMB", "CKMBINDEX", "TROPONINI" in the last 168 hours.  HbA1C: Hgb A1c MFr Bld  Date/Time Value Ref Range Status  03/08/2019 12:34 PM 5.0 4.8 - 5.6 % Final    Comment:    (NOTE) Pre diabetes:          5.7%-6.4% Diabetes:              >6.4% Glycemic control for   <7.0% adults with diabetes   04/06/2016 04:43 AM 5.1 4.8 - 5.6 % Final    Comment:    (NOTE)         Pre-diabetes: 5.7 - 6.4         Diabetes: >6.4         Glycemic control for adults with diabetes: <7.0     CBG: Recent Labs  Lab 02/20/22 1550 02/20/22 2325 02/21/22 0416 02/21/22 0746  GLUCAP 147* 77 92 78     Review of Systems:   Unable to assess due to intubation/sedation/critical illness   Past Medical History:  He,  has a past medical history of Hypertension.   Surgical History:   Past Surgical History:  Procedure Laterality Date   JOINT REPLACEMENT       Social History:   reports that he has quit smoking. He has never used smokeless tobacco. He reports current alcohol use of about 28.0 standard drinks of alcohol per week. He reports current drug use. Drug: Marijuana.   Family History:  His family history is not on file.   Allergies No Known Allergies   Home Medications  Prior to Admission medications   Medication Sig Start Date End Date Taking? Authorizing Provider  acetaminophen (TYLENOL) 325 MG tablet Take 2 tablets (650 mg total) by mouth every 6 (six) hours as needed for mild pain or fever (or  Fever >/= 101). 03/14/19   Wyvonnia Dusky, MD  amLODipine (NORVASC) 10 MG tablet Take 1 tablet (10 mg total) by mouth daily. 04/04/19 05/04/19  Lavonia Drafts, MD  aspirin EC 81 MG EC tablet Take 1 tablet (81 mg total) by mouth daily. 03/15/19   Wyvonnia Dusky, MD  famotidine (PEPCID) 20 MG tablet Take 1 tablet (20 mg total) by mouth 2 (two) times daily. 12/04/20   Joni Fears,  Doren Custard, MD  hydrALAZINE (APRESOLINE) 50 MG tablet Take 1 tablet (50 mg total) by mouth every 8 (eight) hours. 04/04/19 05/04/19  Lavonia Drafts, MD  hydrochlorothiazide (HYDRODIURIL) 25 MG tablet Take 25 mg by mouth daily. 04/08/20   [provider]  metoprolol succinate (TOPROL-XL) 25 MG 24 hr tablet Take 1 tablet (25 mg total) by mouth daily. 04/04/19 05/04/19  Lavonia Drafts, MD  potassium chloride (KLOR-CON) 10 MEQ tablet Take 10 mEq by mouth daily. 06/05/20   [provider]     Critical care provider statement:   Total critical care time: 33 minutes   Performed by: Lanney Gins MD   Critical care time was exclusive of separately billable procedures and treating other patients.   Critical care was necessary to treat or prevent imminent or life-threatening deterioration.   Critical care was time spent personally by me on the following activities: development of treatment plan with patient and/or surrogate as well as nursing, discussions with consultants, evaluation of patient's response to treatment, examination of patient, obtaining history from patient or surrogate, ordering and performing treatments and interventions, ordering and review of laboratory studies, ordering and review of radiographic studies, pulse oximetry and re-evaluation of patient's condition.    Ottie Glazier, M.D.  Pulmonary & Critical Care Medicine

## 2022-02-21 NOTE — Procedures (Addendum)
Patient Name: John Spears  MRN: [id number]  Epilepsy Attending: Charlsie Quest  Referring Physician/Provider: Arnaldo Natal, MD  Duration: 02/20/2022 2053 to 02/21/2022 1352  Patient history: 71yo M with ams in the setting of seizures. EEG to evaluate for seizure.   Level of alertness: comatose  AEDs during EEG study: LEV, versed  Technical aspects: This EEG was obtained using a 10 lead EEG system positioned circumferentially without any parasagittal coverage (rapid EEG). Computer selected EEG is reviewed as  well as background features and all clinically significant events.  Description: EEG showed continuous generalized 3 to 6 Hz theta-delta slowing with overriding 15 to 18 Hz beta activity distributed symmetrically and diffusely. Hyperventilation and photic stimulation were not performed.   Of note, parts of study were technically difficult due to significant electrode artifact.     ABNORMALITY - Continuous slow, generalized  IMPRESSION: This limited ceribell study is suggestive of moderate to severe diffuse encephalopathy, nonspecific etiology but could be secondary to sedation. No seizures or epileptiform discharges were seen throughout the recording.  John Spears

## 2022-02-21 NOTE — Progress Notes (Signed)
When suctioning the patient, his right arm began to have tremors. Noticed that his chin and eye lids were both twitching as well. I pulled up the eyelids, both eyes were deviated upwards and to the left. Noted full upper body tremors. Called for Charge nurse to come if available.  Tremors stopped at 2054. Eyes have both come back to front. Lanora Manis, NP paged.

## 2022-02-21 NOTE — Progress Notes (Signed)
RT assisted with patient transport from ICU to MRI and back with no complications. Patient transported on trilogy ventilator.

## 2022-02-22 DIAGNOSIS — R569 Unspecified convulsions: Secondary | ICD-10-CM | POA: Diagnosis not present

## 2022-02-22 LAB — RENAL FUNCTION PANEL
Albumin: 2.5 g/dL — ABNORMAL LOW (ref 3.5–5.0)
Anion gap: 7 (ref 5–15)
BUN: 12 mg/dL (ref 8–23)
CO2: 24 mmol/L (ref 22–32)
Calcium: 8.6 mg/dL — ABNORMAL LOW (ref 8.9–10.3)
Chloride: 110 mmol/L (ref 98–111)
Creatinine, Ser: 0.98 mg/dL (ref 0.61–1.24)
GFR, Estimated: >60 mL/min (ref >60)
Glucose, Bld: 99 mg/dL (ref 70–99)
Phosphorus: 2.3 mg/dL — ABNORMAL LOW (ref 2.5–4.6)
Potassium: 3.7 mmol/L (ref 3.5–5.1)
Sodium: 141 mmol/L (ref 135–145)

## 2022-02-22 LAB — CBC
HCT: 27.3 % — ABNORMAL LOW (ref 39.0–52.0)
Hemoglobin: 9.2 g/dL — ABNORMAL LOW (ref 13.0–17.0)
MCH: 33.2 pg (ref 26.0–34.0)
MCHC: 33.7 g/dL (ref 30.0–36.0)
MCV: 98.6 fL (ref 80.0–100.0)
Platelets: 136 10*3/uL — ABNORMAL LOW (ref 150–400)
RBC: 2.77 MIL/uL — ABNORMAL LOW (ref 4.22–5.81)
RDW: 18.7 % — ABNORMAL HIGH (ref 11.5–15.5)
WBC: 5.8 10*3/uL (ref 4.0–10.5)
nRBC: 0 % (ref 0.0–0.2)

## 2022-02-22 LAB — GLUCOSE, CAPILLARY
Glucose-Capillary: 71 mg/dL (ref 70–99)
Glucose-Capillary: 79 mg/dL (ref 70–99)
Glucose-Capillary: 85 mg/dL (ref 70–99)
Glucose-Capillary: 88 mg/dL (ref 70–99)
Glucose-Capillary: 91 mg/dL (ref 70–99)
Glucose-Capillary: 94 mg/dL (ref 70–99)
Glucose-Capillary: 98 mg/dL (ref 70–99)

## 2022-02-22 LAB — PROCALCITONIN: Procalcitonin: 0.61 ng/mL

## 2022-02-22 MED ORDER — K PHOS MONO-SOD PHOS DI & MONO 155-852-130 MG PO TABS
500.0000 mg | ORAL_TABLET | ORAL | Status: AC
  Administered 2022-02-22: 500 mg
  Filled 2022-02-22 (×2): qty 2

## 2022-02-22 MED ORDER — LEVETIRACETAM IN NACL 1000 MG/100ML IV SOLN
1000.0000 mg | Freq: Two times a day (BID) | INTRAVENOUS | Status: DC
  Administered 2022-02-22 – 2022-02-25 (×6): 1000 mg via INTRAVENOUS
  Filled 2022-02-22 (×9): qty 100

## 2022-02-22 MED ORDER — METOPROLOL TARTRATE 5 MG/5ML IV SOLN: 2.5000 mg | Freq: Once | INTRAVENOUS | Status: DC

## 2022-02-22 MED ORDER — KETOROLAC TROMETHAMINE 15 MG/ML IJ SOLN: 15.0000 mg | Freq: Four times a day (QID) | INTRAMUSCULAR | Status: DC | PRN

## 2022-02-22 MED ORDER — METOPROLOL TARTRATE 5 MG/5ML IV SOLN
2.5000 mg | INTRAVENOUS | Status: DC | PRN
  Administered 2022-02-22 – 2022-02-23 (×4): 2.5 mg via INTRAVENOUS
  Filled 2022-02-22 (×4): qty 5

## 2022-02-22 MED ORDER — DEXTROSE 50 % IV SOLN
12.5000 g | Freq: Once | INTRAVENOUS | Status: AC
  Administered 2022-02-22: 12.5 g via INTRAVENOUS
  Filled 2022-02-22: qty 50

## 2022-02-22 NOTE — Progress Notes (Signed)
NAME:  John Spears, MRN:  [id number], DOB:  09-26-1950, LOS: 2 ADMISSION DATE:  02/20/2022, CONSULTATION DATE:  02/20/2022 REFERRING MD:  Dr. Cinda Quest, CHIEF COMPLAINT:  Unresponsive, Seizures   Brief Pt Description / Synopsis:  71 y.o. Male admitted with Acute Metabolic Encephalopathy in setting of recurrent Seizures requiring intubation for airway protection.  History of Present Illness:  John Spears is a 71 year old male with a past medical history significant for hypertension and alcohol abuse who presented to [facility] ED on 02/20/2022 after being found down and unresponsive by a friend at home.  Patient is currently intubated and sedated, unable to contribute to history, no family is currently available, therefore history is obtained from chart review.  Per ED and nursing notes, the patient's friend contacted EMS as he found the patient down on the floor, unresponsive.  Last known well time is unknown.  According to EMS, the patient had 3 witnessed seizures during which the eyes were deviated to the right.  When not actively seizing his eyes were deviated to the left.  EMS administered Versed en route.  Upon arrival to the ED, the patient was noted to be postictal.  While in the ED he again had another seizure and was unable to protect his airway, therefore he was subsequent intubated for airway protection by ED provider.  ED Course: Initial Vital Signs: Temperature 97.8 F axillary, respiratory rate 23, pulse 141, blood pressure 163/101, SpO2 99% Significant Labs: Bicarb 16, anion gap 20, lactic acid greater than 9, glucose 163, BUN 11, creatinine 1.1, AST 69, high-sensitivity troponin 27, WBC 7.9, hemoglobin 9.6, hematocrit 30.8, MCV 108.5, RDW 17.8, serum Tylenol less than 10, salicylates less than 7, ethyl alcohol less than 10 ABG postintubation: pH 7.37/pCO2 25/pO2 115/bicarb 14.5 Imaging Chest X-ray>>FINDINGS: Tip of endotracheal tube projects 5.5 cm above carina. Normal heart size,  mediastinal contours, and pulmonary vascularity. Lungs clear. No pulmonary infiltrate, pleural effusion, or pneumothorax. CT head>> IMPRESSION: There are no signs of bleeding within the cranium. There is interval appearance of moderate sized area of low-density in the left posterior parietal cortex, possibly old infarct. Less likely possibilities would include subacute infarct or edema surrounding a space-occupying lesion. Follow-up MRI as clinically warranted may be considered. Atrophy.  Small-vessel disease. Chronic sinusitis.  Possible lipoma in the right frontal scalp.  CT Cervical spine>>IMPRESSION: No recent fracture is seen in cervical spine. Cervical spondylosis, more severe at C5-C6 level. Medications Administered: 7.5 mg Versed during intubation  02/22/22- patient is on SBT , he is not following commands just yet but he is passing pulmonary mechanics on ventilator.   Pertinent  Medical History   Past Medical History:  Diagnosis Date   Hypertension     Micro Data:  12/29: SARS-CoV-2 & Influenza PCR>> 12/29: MRSA PCR>> 12/29: Tracheal aspirate>>  Antimicrobials:  Unasyn 12/29>>  Significant Hospital Events: Including procedures, antibiotic start and stop dates in addition to other pertinent events   12/29: Presented to ED, required intubation for airway protection.  PCCM asked to admit.  Neuro consulted, to place Cerebell 02/21/22- patient remains on keppra intubated.  He was seen by neurology and there is plan for MRI. I met with family today and reviewed medical plan answered questions.   Objective   Blood pressure (!) 151/90, pulse 76, temperature 97.9 F (36.6 C), temperature source Axillary, resp. rate 18, height _0  (1.803 m), weight 78.7 kg, SpO2 100 %.    Vent Mode: PRVC FiO2 (%):  [24 %] 24 %  Set Rate:  [18 bmp] 18 bmp Vt Set:  [500 mL] 500 mL PEEP:  [5 cmH20] [place] Pressure:  [15 cmH20-17 cmH20] 15 cmH20   Intake/Output Summary (Last 24  hours) at 02/22/2022 1029 Last data filed at 02/22/2022 0900 Gross per 24 hour  Intake 1303.74 ml  Output 725 ml  Net 578.74 ml    Filed Weights   02/20/22 1633 02/21/22 0500 02/22/22 0500  Weight: 81.6 kg 80.9 kg 78.7 kg    Examination: General: Acute on chronically ill-appearing male, laying in bed, intubated and sedated, no acute distress HENT: Atraumatic, normocephalic, neck supple, no JVD Lungs: Coarse rhonchi throughout, even, nonlabored, synchronous with the vent Cardiovascular: Tachycardia, regular rhythm, S1-S2, no murmurs, rubs or gallops Abdomen: Soft, nontender, nondistended, no guarding room tenderness, bowel sounds positive x 4 Extremities: Normal bulk and tone, no deformities, no edema Neuro: Sedated, does not follow commands, pupils PERRLA GU: Foley catheter in place  Resolved Hospital Problem list     Assessment & Plan:   #Intubated for airway protection  #? Aspiration -Full vent support, implement lung protective strategies -Plateau pressures less than 30 cm H20 -Wean FiO2 & PEEP as tolerated to maintain O2 sats >92% -Follow intermittent Chest X-ray & ABG as needed -Spontaneous Breathing Trials when respiratory parameters met and mental status permits -Implement VAP Bundle -Prn Bronchodilators -Antibiotics as above- on UNASYN  #Mildly elevated Troponin, suspect demand ischemia PMHx: Hypertension -Continuous cardiac monitoring -Maintain MAP >65 -IV fluids -Vasopressors as needed to maintain MAP goal -Trend lactic acid until normalized -Trend HS Troponin until peaked   #AG Metabolic Acidosis #Lactic Acidosis, suspect due to recurrent seizures -Monitor I&O's / urinary output -Follow BMP -Ensure adequate renal perfusion -Avoid nephrotoxic agents as able -Replace electrolytes as indicated -IV fluids -Low dose Bicarb gtt -Trend lactic  #Hyperglycemia -CBG's q4h; Target range of 140 to 180 -SSI -Follow ICU Hypo/Hyperglycemia protocol  #Acute  Metabolic Encephalopathy in setting of Seizures, ? ETOH withdrawal seizures #Sedation needs in setting of mechanical ventilation #High risk for development of DT's PMHx: ETOH abuse CT head reveals a medium to large sized wedge-shaped hypodensity in the left parietal lobe, most likely representing a late subacute ischemic infarction. There are no signs of bleeding within the cranium.   -Maintain a RASS goal of 0 to -1 -Versed and Fentanyl as needed to maintain RASS goal -Avoid sedating medications as able -Daily wake up assessment -Serum alcohol less than 10, Tylenol and salicylates negative -UDS positive for marijuana and benzodiazepines -Seizure precautions -CIWA protocol -High-dose thiamine x 3 days, followed by 100 mg thiamine IV daily -Neurology following, appreciate input- Dr Cheral Marker -Cerebral EEG -Continue Keppra twice daily -Obtain MRI brain when more stable       Best Practice (right click and "Reselect all SmartList Selections" daily)   Diet/type: NPO DVT prophylaxis: prophylactic heparin  GI prophylaxis: PPI Lines: N/A Foley:  Yes, and it is still needed Code Status:  full code Last date of multidisciplinary goals of care discussion [N/A]  Labs   CBC: Recent Labs  Lab 02/20/22 1547 02/21/22 0418 02/22/22 0434  WBC 7.9 6.5 5.8  NEUTROABS 3.5  --   --   HGB 9.6* 9.4* 9.2*  HCT 30.8* 27.8* 27.3*  MCV 108.5* 98.9 98.6  PLT 201 142* 136*     Basic Metabolic Panel: Recent Labs  Lab 02/20/22 1547 02/20/22 2039 02/21/22 0418 02/21/22 1032 02/22/22 0434  NA 142 140 139  --  141  K 4.2 5.6*  4.0  --  3.7  CL 106 106 107  --  110  CO2 16* 21* 21*  --  24  GLUCOSE 163* 113* 97  --  99  BUN _0 --  12  CREATININE 1.10 0.98 0.93  --  0.98  CALCIUM 9.3 8.7* 8.5*  --  8.6*  MG  --   --   --  1.7  --   PHOS  --   --  3.7  --  2.3*    GFR: Estimated Creatinine Clearance: 75.8 mL/min (by C-G formula based on SCr of 0.98 mg/dL). Recent Labs  Lab  02/20/22 1547 02/20/22 1746 02/20/22 1747 02/20/22 1959 02/21/22 0418 02/21/22 1033 02/21/22 1258 02/22/22 0434  PROCALCITON  --  0.11  --   --  0.65  --   --  0.61  WBC 7.9  --   --   --  6.5  --   --  5.8  LATICACIDVEN  --   --  >9.0* 7.4*  --  2.6* 2.4*  --      Liver Function Tests: Recent Labs  Lab 02/20/22 1547 02/21/22 0418 02/22/22 0434  AST 69*  --   --   ALT 20  --   --   ALKPHOS 63  --   --   BILITOT 0.8  --   --   PROT 6.2*  --   --   ALBUMIN 3.4* 2.6* 2.5*    Recent Labs  Lab 02/20/22 1547  LIPASE 38    No results for input(s): "AMMONIA" in the last 168 hours.  ABG    Component Value Date/Time   PHART 7.41 02/21/2022 0500   PCO2ART 39 02/21/2022 0500   PO2ART 142 (H) 02/21/2022 0500   HCO3 24.7 02/21/2022 0500   TCO2 23 10/02/2020 1707   ACIDBASEDEF 9.0 (H) 02/20/2022 1644   O2SAT 99.7 02/21/2022 0500     Coagulation Profile: Recent Labs  Lab 02/20/22 1547  INR 1.2     Cardiac Enzymes: No results for input(s): "CKTOTAL", "CKMB", "CKMBINDEX", "TROPONINI" in the last 168 hours.  HbA1C: Hgb A1c MFr Bld  Date/Time Value Ref Range Status  03/08/2019 12:34 PM 5.0 4.8 - 5.6 % Final    Comment:    (NOTE) Pre diabetes:          5.7%-6.4% Diabetes:              >6.4% Glycemic control for   <7.0% adults with diabetes   04/06/2016 04:43 AM 5.1 4.8 - 5.6 % Final    Comment:    (NOTE)         Pre-diabetes: 5.7 - 6.4         Diabetes: >6.4         Glycemic control for adults with diabetes: <7.0     CBG: Recent Labs  Lab 02/21/22 1929 02/21/22 2338 02/22/22 0034 02/22/22 0344 02/22/22 0733  GLUCAP 84 68* 98 88 79     Review of Systems:   Unable to assess due to intubation/sedation/critical illness   Past Medical History:  He,  has a past medical history of Hypertension.   Surgical History:   Past Surgical History:  Procedure Laterality Date   JOINT REPLACEMENT       Social History:   reports that he has quit  smoking. He has never used smokeless tobacco. He reports current alcohol use of about 28.0 standard drinks of alcohol per week. He reports current drug use.  Drug: Marijuana.   Family History:  His family history is not on file.   Allergies No Known Allergies   Home Medications  Prior to Admission medications   Medication Sig Start Date End Date Taking? Authorizing Provider  acetaminophen (TYLENOL) 325 MG tablet Take 2 tablets (650 mg total) by mouth every 6 (six) hours as needed for mild pain or fever (or Fever >/= 101). 03/14/19   Wyvonnia Dusky, MD  amLODipine (NORVASC) 10 MG tablet Take 1 tablet (10 mg total) by mouth daily. 04/04/19 05/04/19  Lavonia Drafts, MD  aspirin EC 81 MG EC tablet Take 1 tablet (81 mg total) by mouth daily. 03/15/19   Wyvonnia Dusky, MD  famotidine (PEPCID) 20 MG tablet Take 1 tablet (20 mg total) by mouth 2 (two) times daily. 12/04/20   Carrie Mew, MD  hydrALAZINE (APRESOLINE) 50 MG tablet Take 1 tablet (50 mg total) by mouth every 8 (eight) hours. 04/04/19 05/04/19  Lavonia Drafts, MD  hydrochlorothiazide (HYDRODIURIL) 25 MG tablet Take 25 mg by mouth daily. 04/08/20   [provider]  metoprolol succinate (TOPROL-XL) 25 MG 24 hr tablet Take 1 tablet (25 mg total) by mouth daily. 04/04/19 05/04/19  Lavonia Drafts, MD  potassium chloride (KLOR-CON) 10 MEQ tablet Take 10 mEq by mouth daily. 06/05/20   [provider]     Critical care provider statement:   Total critical care time: 33 minutes   Performed by: Lanney Gins MD   Critical care time was exclusive of separately billable procedures and treating other patients.   Critical care was necessary to treat or prevent imminent or life-threatening deterioration.   Critical care was time spent personally by me on the following activities: development of treatment plan with patient and/or surrogate as well as nursing, discussions with consultants, evaluation of patient's response to  treatment, examination of patient, obtaining history from patient or surrogate, ordering and performing treatments and interventions, ordering and review of laboratory studies, ordering and review of radiographic studies, pulse oximetry and re-evaluation of patient's condition.    Ottie Glazier, M.D.  Pulmonary & Critical Care Medicine

## 2022-02-22 NOTE — Procedures (Addendum)
Patient Name: John Spears  MRN: [id number]  Epilepsy Attending: Charlsie Quest  Referring Physician/Provider: Dr Caryl Pina  Duration: 02/21/2022 2151 to 02/22/2022 1046   Patient history: 71yo M with ams in the setting of seizures. EEG to evaluate for seizure.    Level of alertness: comatose   AEDs during EEG study: LEV, versed   Technical aspects: This EEG was obtained using a 10 lead EEG system positioned circumferentially without any parasagittal coverage (rapid EEG). Computer selected EEG is reviewed as  well as background features and all clinically significant events.   Description: EEG showed continuous generalized 3 to 6 Hz theta-delta slowing with overriding 15 to 18 Hz beta activity distributed symmetrically and diffusely. Hyperventilation and photic stimulation were not performed.    Of note, parts of study were technically difficult due to significant electrode artifact. Additionally, study was not interpretable between 02/21/2022 2258 to 02/22/2022 0321 for unclear reasons.    ABNORMALITY - Continuous slow, generalized   IMPRESSION: This limited ceribell study is suggestive of moderate to severe diffuse encephalopathy, nonspecific etiology but could be secondary to sedation. No seizures or epileptiform discharges were seen throughout the recording.  Please obtain conventional EEG if concern for ictal-interictal activity persists.    John Spears

## 2022-02-22 NOTE — Progress Notes (Addendum)
Subjective: Patient had a focal onset seizure overnight lasting for about 50 seconds with head turned to the right with twitching in all 4 limbs, which sotpped spontaneously. Sedation stopped 30 minutes ago.  Objective: Current vital signs: BP (!) 151/90   Pulse 76   Temp 97.9 F (36.6 C) (Axillary)   Resp 18   Ht 5\' 11"  (1.803 m)   Wt 78.7 kg   SpO2 100%   BMI 24.20 kg/m  Vital signs in last 24 hours: Temp:  [97.3 F (36.3 C)-98.8 F (37.1 C)] 97.9 F (36.6 C) (12/31 0800) Pulse Rate:  [36-84] 76 (12/31 0900) Resp:  [18-22] 18 (12/31 0900) BP: (98-158)/(69-92) 151/90 (12/31 0900) SpO2:  [99 %-100 %] 100 % (12/31 0900) FiO2 (%):  [24 %] 24 % (12/31 0726) Weight:  [78.7 kg] 78.7 kg (12/31 0500)  Intake/Output from previous day: 12/30 0701 - 12/31 0700 In: 1442.3 [I.V.:790.4; IV Piggyback:651.9] Out: 725 [Urine:725] Intake/Output this shift: No intake/output data recorded. Nutritional status:  Diet Order             Diet NPO time specified  Diet effective now                   Physical Exam  HEENT-  [place]/AT. No neck stiffness.  Lungs- Intubated Extremities- No edema    Neurological Examination Mental Status:  Sedation stopped 30 minutes prior to exam.  Opens eyes partially to sternal rub but does not make eye contact or track. Will follow command to move his feet and arms start to tremor when asked to move arms, but otherwise no responding to commands. No attempts by patient to communicate.    Cranial Nerves: II: No blink to threat. Pupils 2 mm and reactive.      III,IV, VI:  Eyes are conjugate at the midline. There is partial suppression of oculocephalic reflex. When eyelids are held open, they exhibit tremor that resolves when closed again.   V,VII: Corneal reflexes are present bilaterally VIII: Responds to two commands.  IX,X: Intubated XI: Decreased tone of neck musculature.  XII: Unable to assess Motor/Sensory: Flaccid tone x 4 without asymmetry.  Will  follow command to move his feet by wiggling his toes, and arms start to tremor when asked to move arms, but otherwise no responding to commands. Does not move BUE to pinch. Does not move BLE to light noxious plantar stimulation.  Deep Tendon Reflexes: 2+ bilateral brachioradialis and patellae Plantars: Mute bilaterally Cerebellar/Gait: Unable to assess   Lab Results: Results for orders placed or performed during the hospital encounter of 02/20/22 (from the past 48 hour(s))  Acetaminophen level     Status: Abnormal   Collection Time: 02/20/22  3:47 PM  Result Value Ref Range   Acetaminophen (Tylenol), Serum <10 (L) 10 - 30 ug/mL    Comment: (NOTE) Therapeutic concentrations vary significantly. A range of 10-30 ug/mL  may be an effective concentration for many patients. However, some  are best treated at concentrations outside of this range. Acetaminophen concentrations >150 ug/mL at 4 hours after ingestion  and >50 ug/mL at 12 hours after ingestion are often associated with  toxic reactions.  Performed at [facility], [address] [place], [place] [address]   Comprehensive metabolic panel     Status: Abnormal   Collection Time: 02/20/22  3:47 PM  Result Value Ref Range   Sodium 142 135 - 145 mmol/L   Potassium 4.2 3.5 - 5.1 mmol/L   Chloride 106  98 - 111 mmol/L   CO2 16 (L) 22 - 32 mmol/L   Glucose, Bld 163 (H) 70 - 99 mg/dL    Comment: Glucose reference range applies only to samples taken after fasting for at least 8 hours.   BUN 11 8 - 23 mg/dL   Creatinine, Ser 1.61 0.61 - 1.24 mg/dL   Calcium 9.3 8.9 - 09.6 mg/dL   Total Protein 6.2 (L) 6.5 - 8.1 g/dL   Albumin 3.4 (L) 3.5 - 5.0 g/dL   AST 69 (H) 15 - 41 U/L   ALT 20 0 - 44 U/L   Alkaline Phosphatase 63 38 - 126 U/L   Total Bilirubin 0.8 0.3 - 1.2 mg/dL   GFR, Estimated >04 >54 mL/min    Comment: (NOTE) Calculated using the CKD-EPI Creatinine Equation (2021)    Anion gap 20 (H) 5 - 15    Comment:  Performed at [facility], [address] [place], [place] [address]  Ethanol     Status: None   Collection Time: 02/20/22  3:47 PM  Result Value Ref Range   Alcohol, Ethyl (B) <10 <10 mg/dL    Comment: (NOTE) Lowest detectable limit for serum alcohol is 10 mg/dL.  For medical purposes only. Performed at [facility], [address] [place], [place] [address]   Lipase, blood     Status: None   Collection Time: 02/20/22  3:47 PM  Result Value Ref Range   Lipase 38 11 - 51 U/L    Comment: Performed at [facility], [address] [place], [place] [address]  Salicylate level     Status: Abnormal   Collection Time: 02/20/22  3:47 PM  Result Value Ref Range   Salicylate Lvl <7.0 (L) 7.0 - 30.0 mg/dL    Comment: Performed at [facility], [address] [place], [place] [address]  Troponin I (High Sensitivity)     Status: Abnormal   Collection Time: 02/20/22  3:47 PM  Result Value Ref Range   Troponin I (High Sensitivity) 27 (H) <18 ng/L    Comment: (NOTE) Elevated high sensitivity troponin I (hsTnI) values and significant  changes across serial measurements may suggest ACS but many other  chronic and acute conditions are known to elevate hsTnI results.  Refer to the "Links" section for chest pain algorithms and additional  guidance. Performed at [facility], [address] [place], [place] [address]   CBC with Differential     Status: Abnormal   Collection Time: 02/20/22  3:47 PM  Result Value Ref Range   WBC 7.9 4.0 - 10.5 K/uL   RBC 2.84 (L) 4.22 - 5.81 MIL/uL   Hemoglobin 9.6 (L) 13.0 - 17.0 g/dL   HCT 96.2 (L) 95.2 - 84.1 %   MCV 108.5 (H) 80.0 - 100.0 fL   MCH 33.8 26.0 - 34.0 pg   MCHC 31.2 30.0 - 36.0 g/dL   RDW 32.4 (H) 40.1 - 02.7 %   Platelets 201 150 - 400 K/uL   nRBC 0.8 (H) 0.0 - 0.2 %   Neutrophils Relative % 44 %   Neutro Abs 3.5 1.7 - 7.7 K/uL   Lymphocytes Relative 43 %   Lymphs Abs 3.4 0.7 - 4.0  K/uL   Monocytes Relative 11 %   Monocytes Absolute 0.9 0.1 - 1.0 K/uL   Eosinophils Relative 0 %   Eosinophils Absolute 0.0 0.0 - 0.5 K/uL   Basophils Relative 1 %   Basophils Absolute  0.1 0.0 - 0.1 K/uL   Immature Granulocytes 1 %   Abs Immature Granulocytes 0.05 0.00 - 0.07 K/uL    Comment: Performed at [facility], [address] [place], [place] [address]  Protime-INR     Status: Abnormal   Collection Time: 02/20/22  3:47 PM  Result Value Ref Range   Prothrombin Time 15.5 (H) 11.4 - 15.2 seconds   INR 1.2 0.8 - 1.2    Comment: (NOTE) INR goal varies based on device and disease states. Performed at [facility], [address] [place], [place] [address]   APTT     Status: Abnormal   Collection Time: 02/20/22  3:47 PM  Result Value Ref Range   aPTT 21 (L) 24 - 36 seconds    Comment: Performed at [facility], [address] [place], [place] [address]  POC CBG, ED     Status: Abnormal   Collection Time: 02/20/22  3:50 PM  Result Value Ref Range   Glucose-Capillary 147 (H) 70 - 99 mg/dL    Comment: Glucose reference range applies only to samples taken after fasting for at least 8 hours.  Blood gas, arterial     Status: Abnormal   Collection Time: 02/20/22  4:44 PM  Result Value Ref Range   FIO2 28 %   Delivery systems VENTILATOR    MECHVT 500 mL   PEEP 5 cm H20   pH, Arterial 7.37 7.35 - 7.45   pCO2 arterial 25 (L) 32 - 48 mmHg   pO2, Arterial 115 (H) 83 - 108 mmHg   Bicarbonate 14.5 (L) 20.0 - 28.0 mmol/L   Acid-base deficit 9.0 (H) 0.0 - 2.0 mmol/L   O2 Saturation 99.4 %   Patient temperature 37.0    Collection site RIGHT RADIAL    Allens test (pass/fail) PASS PASS   Mechanical Rate 18     Comment: Performed at [facility], [address] [place], [place] [address]  Urinalysis, Routine w reflex microscopic Urine, Catheterized     Status: Abnormal   Collection Time: 02/20/22  4:52 PM  Result Value Ref Range   Color,  Urine YELLOW (A) YELLOW   APPearance HAZY (A) CLEAR   Specific Gravity, Urine 1.014 1.005 - 1.030   pH 5.0 5.0 - 8.0   Glucose, UA NEGATIVE NEGATIVE mg/dL   Hgb urine dipstick SMALL (A) NEGATIVE   Bilirubin Urine NEGATIVE NEGATIVE   Ketones, ur NEGATIVE NEGATIVE mg/dL   Protein, ur 702 (A) NEGATIVE mg/dL   Nitrite NEGATIVE NEGATIVE   Leukocytes,Ua NEGATIVE NEGATIVE   RBC / HPF 6-10 0 - 5 RBC/hpf   WBC, UA 6-10 0 - 5 WBC/hpf   Bacteria, UA NONE SEEN NONE SEEN   Squamous Epithelial / LPF 0-5 0 - 5 /HPF   Mucus PRESENT    Hyaline Casts, UA PRESENT     Comment: Performed at [facility], [address] [place], [place] [address]  Urine Drug Screen, Qualitative     Status: Abnormal   Collection Time: 02/20/22  4:52 PM  Result Value Ref Range   Tricyclic, Ur Screen NONE DETECTED NONE DETECTED   Amphetamines, Ur Screen NONE DETECTED NONE DETECTED   MDMA (Ecstasy)Ur Screen NONE DETECTED NONE DETECTED   Cocaine Metabolite,Ur [place] NONE DETECTED NONE DETECTED   Opiate, Ur Screen NONE DETECTED NONE DETECTED   Phencyclidine (PCP) Ur S NONE DETECTED NONE DETECTED   Cannabinoid 50 Ng, Ur [place] POSITIVE (A) NONE DETECTED  Barbiturates, Ur Screen NONE DETECTED NONE DETECTED   Benzodiazepine, Ur Scrn POSITIVE (A) NONE DETECTED   Methadone Scn, Ur NONE DETECTED NONE DETECTED    Comment: (NOTE) Tricyclics + metabolites, urine    Cutoff 1000 ng/mL Amphetamines + metabolites, urine  Cutoff 1000 ng/mL MDMA (Ecstasy), urine              Cutoff 500 ng/mL Cocaine Metabolite, urine          Cutoff 300 ng/mL Opiate + metabolites, urine        Cutoff 300 ng/mL Phencyclidine (PCP), urine         Cutoff 25 ng/mL Cannabinoid, urine                 Cutoff 50 ng/mL Barbiturates + metabolites, urine  Cutoff 200 ng/mL Benzodiazepine, urine              Cutoff 200 ng/mL Methadone, urine                   Cutoff 300 ng/mL  The urine drug screen provides only a preliminary, unconfirmed analytical test  result and should not be used for non-medical purposes. Clinical consideration and professional judgment should be applied to any positive drug screen result due to possible interfering substances. A more specific alternate chemical method must be used in order to obtain a confirmed analytical result. Gas chromatography / mass spectrometry (GC/MS) is the preferred confirm atory method. Performed at [facility], [address] [place], [place] [address]   Procalcitonin - Baseline     Status: None   Collection Time: 02/20/22  5:46 PM  Result Value Ref Range   Procalcitonin 0.11 ng/mL    Comment:        Interpretation: PCT (Procalcitonin) <= 0.5 ng/mL: Systemic infection (sepsis) is not likely. Local bacterial infection is possible. (NOTE)       Sepsis PCT Algorithm           Lower Respiratory Tract                                      Infection PCT Algorithm    ----------------------------     ----------------------------         PCT < 0.25 ng/mL                PCT < 0.10 ng/mL          Strongly encourage             Strongly discourage   discontinuation of antibiotics    initiation of antibiotics    ----------------------------     -----------------------------       PCT 0.25 - 0.50 ng/mL            PCT 0.10 - 0.25 ng/mL               OR       >80% decrease in PCT            Discourage initiation of                                            antibiotics      Encourage discontinuation           of antibiotics    ----------------------------     -----------------------------  PCT >= 0.50 ng/mL              PCT 0.26 - 0.50 ng/mL               AND        <80% decrease in PCT             Encourage initiation of                                             antibiotics       Encourage continuation           of antibiotics    ----------------------------     -----------------------------        PCT >= 0.50 ng/mL                  PCT > 0.50 ng/mL               AND          increase in PCT                  Strongly encourage                                      initiation of antibiotics    Strongly encourage escalation           of antibiotics                                     -----------------------------                                           PCT <= 0.25 ng/mL                                                 OR                                        > 80% decrease in PCT                                      Discontinue / Do not initiate                                             antibiotics  Performed at [facility], [address] [place], [place] [address]   Lactic acid, plasma     Status: Abnormal   Collection Time: 02/20/22  5:47 PM  Result Value Ref Range   Lactic Acid, Venous >9.0 (HH) 0.5 - 1.9 mmol/L    Comment: CRITICAL RESULT CALLED TO, READ BACK BY AND VERIFIED WITH LISA GEISLER  02/20/22 @ 1819 BY SB Performed at [facility], [address] [place], [place] [address]   Troponin I (High Sensitivity)     Status: Abnormal   Collection Time: 02/20/22  5:47 PM  Result Value Ref Range   Troponin I (High Sensitivity) 124 (HH) <18 ng/L    Comment: CRITICAL RESULT CALLED TO, READ BACK BY AND VERIFIED WITH LISA GEISLER 02/20/22 @ 1825 by sb (NOTE) Elevated high sensitivity troponin I (hsTnI) values and significant  changes across serial measurements may suggest ACS but many other  chronic and acute conditions are known to elevate hsTnI results.  Refer to the "Links" section for chest pain algorithms and additional  guidance. Performed at [facility], [address] [place], [place] [address]   MRSA Next Gen by PCR, Nasal     Status: None   Collection Time: 02/20/22  7:23 PM   Specimen: Nasal Mucosa; Nasal Swab  Result Value Ref Range   MRSA by PCR Next Gen NOT DETECTED NOT DETECTED    Comment: (NOTE) The GeneXpert MRSA Assay (FDA approved for NASAL specimens only), is one component of a  comprehensive MRSA colonization surveillance program. It is not intended to diagnose MRSA infection nor to guide or monitor treatment for MRSA infections. Test performance is not FDA approved in patients less than 69 years old. Performed at [facility], [address] [place], [place] [address]   Lactic acid, plasma     Status: Abnormal   Collection Time: 02/20/22  7:59 PM  Result Value Ref Range   Lactic Acid, Venous 7.4 (HH) 0.5 - 1.9 mmol/L    Comment: CRITICAL VALUE NOTED. VALUE IS CONSISTENT WITH PREVIOUSLY REPORTED/CALLED VALUE SRR Performed at [facility], [address] [place], [place] [address]   HIV Antibody (routine testing w rflx)     Status: None   Collection Time: 02/20/22  7:59 PM  Result Value Ref Range   HIV Screen 4th Generation wRfx Non Reactive Non Reactive    Comment: Performed at [facility], [address], [place], [place] [address]  Basic metabolic panel     Status: Abnormal   Collection Time: 02/20/22  8:39 PM  Result Value Ref Range   Sodium 140 135 - 145 mmol/L   Potassium 5.6 (H) 3.5 - 5.1 mmol/L    Comment: HEMOLYSIS AT THIS LEVEL MAY AFFECT RESULT   Chloride 106 98 - 111 mmol/L   CO2 21 (L) 22 - 32 mmol/L   Glucose, Bld 113 (H) 70 - 99 mg/dL    Comment: Glucose reference range applies only to samples taken after fasting for at least 8 hours.   BUN 11 8 - 23 mg/dL   Creatinine, Ser 1.30 0.61 - 1.24 mg/dL   Calcium 8.7 (L) 8.9 - 10.3 mg/dL   GFR, Estimated >86 >57 mL/min    Comment: (NOTE) Calculated using the CKD-EPI Creatinine Equation (2021)    Anion gap 13 5 - 15    Comment: Performed at [facility], [address] [place], [place] [address]  Troponin I (High Sensitivity)     Status: Abnormal   Collection Time: 02/20/22  8:39 PM  Result Value Ref Range   Troponin I (High Sensitivity) 188 (HH) <18 ng/L    Comment: CRITICAL VALUE NOTED. VALUE IS CONSISTENT WITH PREVIOUSLY REPORTED/CALLED VALUE  SRR (NOTE) Elevated high sensitivity troponin I (hsTnI) values and significant  changes across serial measurements may suggest ACS but many other  chronic and acute conditions are known to elevate hsTnI results.  Refer to the "Links" section for chest pain algorithms and additional  guidance. Performed at [facility], [address] [place], [place] [address]   Troponin I (High Sensitivity)     Status: Abnormal   Collection Time: 02/20/22 10:45 PM  Result Value Ref Range   Troponin I (High Sensitivity) 291 (HH) <18 ng/L    Comment: CRITICAL VALUE NOTED. VALUE IS CONSISTENT WITH PREVIOUSLY REPORTED/CALLED VALUE DLB (NOTE) Elevated high sensitivity troponin I (hsTnI) values and significant  changes across serial measurements may suggest ACS but many other  chronic and acute conditions are known to elevate hsTnI results.  Refer to the "Links" section for chest pain algorithms and additional  guidance. Performed at [facility], [address] [place], [place] [address]   Glucose, capillary     Status: None   Collection Time: 02/20/22 11:25 PM  Result Value Ref Range   Glucose-Capillary 77 70 - 99 mg/dL    Comment: Glucose reference range applies only to samples taken after fasting for at least 8 hours.  Glucose, capillary     Status: None   Collection Time: 02/21/22  4:16 AM  Result Value Ref Range   Glucose-Capillary 92 70 - 99 mg/dL    Comment: Glucose reference range applies only to samples taken after fasting for at least 8 hours.  CBC     Status: Abnormal   Collection Time: 02/21/22  4:18 AM  Result Value Ref Range   WBC 6.5 4.0 - 10.5 K/uL   RBC 2.81 (L) 4.22 - 5.81 MIL/uL   Hemoglobin 9.4 (L) 13.0 - 17.0 g/dL   HCT 09.8 (L) 11.9 - 14.7 %   MCV 98.9 80.0 - 100.0 fL    Comment: REPEATED TO VERIFY   MCH 33.5 26.0 - 34.0 pg   MCHC 33.8 30.0 - 36.0 g/dL   RDW 82.9 (H) 56.2 - 13.0 %   Platelets 142 (L) 150 - 400 K/uL   nRBC 0.3 (H) 0.0 - 0.2 %     Comment: Performed at [facility], [address], [place], [place] [address]  Renal function panel     Status: Abnormal   Collection Time: 02/21/22  4:18 AM  Result Value Ref Range   Sodium 139 135 - 145 mmol/L   Potassium 4.0 3.5 - 5.1 mmol/L   Chloride 107 98 - 111 mmol/L   CO2 21 (L) 22 - 32 mmol/L   Glucose, Bld 97 70 - 99 mg/dL    Comment: Glucose reference range applies only to samples taken after fasting for at least 8 hours.   BUN 11 8 - 23 mg/dL   Creatinine, Ser 4.69 0.61 - 1.24 mg/dL   Calcium 8.5 (L) 8.9 - 10.3 mg/dL   Phosphorus 3.7 2.5 - 4.6 mg/dL   Albumin 2.6 (L) 3.5 - 5.0 g/dL   GFR, Estimated >62 >95 mL/min    Comment: (NOTE) Calculated using the CKD-EPI Creatinine Equation (2021)    Anion gap 11 5 - 15    Comment: Performed at [facility], [address] [place], [place] [address]  Procalcitonin     Status: None   Collection Time: 02/21/22  4:18 AM  Result Value Ref Range   Procalcitonin 0.65 ng/mL    Comment:        Interpretation: PCT > 0.5 ng/mL and <= 2 ng/mL: Systemic infection (sepsis) is possible, but other conditions are known to  elevate PCT as well. (NOTE)       Sepsis PCT Algorithm           Lower Respiratory Tract                                      Infection PCT Algorithm    ----------------------------     ----------------------------         PCT < 0.25 ng/mL                PCT < 0.10 ng/mL          Strongly encourage             Strongly discourage   discontinuation of antibiotics    initiation of antibiotics    ----------------------------     -----------------------------       PCT 0.25 - 0.50 ng/mL            PCT 0.10 - 0.25 ng/mL               OR       >80% decrease in PCT            Discourage initiation of                                            antibiotics      Encourage discontinuation           of antibiotics    ----------------------------     -----------------------------         PCT >= 0.50 ng/mL               PCT 0.26 - 0.50 ng/mL                AND       <80% decrease in PCT             Encourage initiation of                                             antibiotics       Encourage continuation           of antibiotics    ----------------------------     -----------------------------        PCT >= 0.50 ng/mL                  PCT > 0.50 ng/mL               AND         increase in PCT                  Strongly encourage                                      initiation of antibiotics    Strongly encourage escalation           of antibiotics                                     -----------------------------  PCT <= 0.25 ng/mL                                                 OR                                        > 80% decrease in PCT                                      Discontinue / Do not initiate                                             antibiotics  Performed at [facility], [address] [place], [place] [address]   Blood gas, arterial     Status: Abnormal   Collection Time: 02/21/22  5:00 AM  Result Value Ref Range   FIO2 28 %   Delivery systems VENTILATOR    Mode PRESSURE REGULATED VOLUME CONTROL    MECHVT 500 mL   PEEP 5 cm H20   pH, Arterial 7.41 7.35 - 7.45   pCO2 arterial 39 32 - 48 mmHg   pO2, Arterial 142 (H) 83 - 108 mmHg   Bicarbonate 24.7 20.0 - 28.0 mmol/L   Acid-Base Excess 0.1 0.0 - 2.0 mmol/L   O2 Saturation 99.7 %   Patient temperature 37.0    Collection site RIGHT RADIAL    Drawn by Manson Passey AND CLOUDY    Allens test (pass/fail) PASS PASS   Mechanical Rate 18     Comment: Performed at [facility], [address] [place], [place] [address]  Culture, Respiratory w Gram Stain     Status: None (Preliminary result)   Collection Time: 02/21/22  5:30 AM   Specimen: Tracheal Aspirate; Respiratory  Result Value Ref Range   Specimen Description      TRACHEAL ASPIRATE Performed at [facility], [address] [place], [place] [address]    Special Requests      NONE Performed at [facility], [address] [place], [place] [address]    Gram Stain      ABUNDANT GRAM POSITIVE COCCI IN PAIRS IN CHAINS FEW GRAM NEGATIVE COCCOBACILLI FEW WBC PRESENT, PREDOMINANTLY PMN    Culture      CULTURE REINCUBATED FOR BETTER GROWTH Performed at [facility], [address], [place], [place] [address]    Report Status PENDING   Glucose, capillary     Status: None   Collection Time: 02/21/22  7:46 AM  Result Value Ref Range   Glucose-Capillary 78 70 - 99 mg/dL    Comment: Glucose reference range applies only to samples taken after fasting for at least 8 hours.  Respiratory (~20 pathogens) panel by PCR     Status: None   Collection Time: 02/21/22  8:30 AM   Specimen: Nasopharyngeal Swab; Respiratory  Result Value Ref Range   Adenovirus NOT DETECTED NOT DETECTED   Coronavirus 229E NOT DETECTED NOT DETECTED    Comment: (NOTE) The Coronavirus on the Respiratory Panel, DOES  NOT test for the novel  Coronavirus (2019 nCoV)    Coronavirus HKU1 NOT DETECTED NOT DETECTED   Coronavirus NL63 NOT DETECTED NOT DETECTED   Coronavirus OC43 NOT DETECTED NOT DETECTED   Metapneumovirus NOT DETECTED NOT DETECTED   Rhinovirus / Enterovirus NOT DETECTED NOT DETECTED   Influenza A NOT DETECTED NOT DETECTED   Influenza B NOT DETECTED NOT DETECTED   Parainfluenza Virus 1 NOT DETECTED NOT DETECTED   Parainfluenza Virus 2 NOT DETECTED NOT DETECTED   Parainfluenza Virus 3 NOT DETECTED NOT DETECTED   Parainfluenza Virus 4 NOT DETECTED NOT DETECTED   Respiratory Syncytial Virus NOT DETECTED NOT DETECTED   Bordetella pertussis NOT DETECTED NOT DETECTED   Bordetella Parapertussis NOT DETECTED NOT DETECTED   Chlamydophila pneumoniae NOT DETECTED NOT DETECTED   Mycoplasma pneumoniae NOT DETECTED NOT DETECTED    Comment: Performed at [facility], [address]  [place], [place] [address]  Resp panel by RT-PCR (RSV, Flu A&B, Covid) Anterior Nasal Swab     Status: None   Collection Time: 02/21/22  8:30 AM   Specimen: Anterior Nasal Swab  Result Value Ref Range   SARS Coronavirus 2 by RT PCR NEGATIVE NEGATIVE    Comment: (NOTE) SARS-CoV-2 target nucleic acids are NOT DETECTED.  The SARS-CoV-2 RNA is generally detectable in upper respiratory specimens during the acute phase of infection. The lowest concentration of SARS-CoV-2 viral copies this assay can detect is 138 copies/mL. A negative result does not preclude SARS-Cov-2 infection and should not be used as the sole basis for treatment or other patient management decisions. A negative result may occur with  improper specimen collection/handling, submission of specimen other than nasopharyngeal swab, presence of viral mutation(s) within the areas targeted by this assay, and inadequate number of viral copies(<138 copies/mL). A negative result must be combined with clinical observations, patient history, and epidemiological information. The expected result is Negative.  Fact Sheet for Patients:  BloggerCourse.com  Fact Sheet for Healthcare Providers:  SeriousBroker.it  This test is no t yet approved or cleared by the [place] FDA and  has been authorized for detection and/or diagnosis of SARS-CoV-2 by FDA under an Emergency Use Authorization (EUA). This EUA will remain  in effect (meaning this test can be used) for the duration of the COVID-19 declaration under Section 564(b)(1) of the Act, 21 U.S.C.section 360bbb-3(b)(1), unless the authorization is terminated  or revoked sooner.       Influenza A by PCR NEGATIVE NEGATIVE   Influenza B by PCR NEGATIVE NEGATIVE    Comment: (NOTE) The Xpert Xpress SARS-CoV-2/FLU/RSV plus assay is intended as an aid in the diagnosis of influenza from Nasopharyngeal swab specimens and should not be used as a  sole basis for treatment. Nasal washings and aspirates are unacceptable for Xpert Xpress SARS-CoV-2/FLU/RSV testing.  Fact Sheet for Patients: BloggerCourse.com  Fact Sheet for Healthcare Providers: SeriousBroker.it  This test is not yet approved or cleared by the [place] FDA and has been authorized for detection and/or diagnosis of SARS-CoV-2 by FDA under an Emergency Use Authorization (EUA). This EUA will remain in effect (meaning this test can be used) for the duration of the COVID-19 declaration under Section 564(b)(1) of the Act, 21 U.S.C. section 360bbb-3(b)(1), unless the authorization is terminated or revoked.     Resp Syncytial Virus by PCR NEGATIVE NEGATIVE    Comment: (NOTE) Fact Sheet for Patients: BloggerCourse.com  Fact Sheet for Healthcare Providers: SeriousBroker.it  This test is not yet approved  or cleared by the [place] and has been authorized for detection and/or diagnosis of SARS-CoV-2 by FDA under an Emergency Use Authorization (EUA). This EUA will remain in effect (meaning this test can be used) for the duration of the COVID-19 declaration under Section 564(b)(1) of the Act, 21 U.S.C. section 360bbb-3(b)(1), unless the authorization is terminated or revoked.  Performed at [facility], [address] [place], [place] [address]   Magnesium     Status: None   Collection Time: 02/21/22 10:32 AM  Result Value Ref Range   Magnesium 1.7 1.7 - 2.4 mg/dL    Comment: Performed at [facility], [address] [place], [place] [address]  Lactic acid, plasma     Status: Abnormal   Collection Time: 02/21/22 10:33 AM  Result Value Ref Range   Lactic Acid, Venous 2.6 (HH) 0.5 - 1.9 mmol/L    Comment: CRITICAL VALUE NOTED. VALUE IS CONSISTENT WITH PREVIOUSLY REPORTED/CALLED VALUE.PMF Performed at [facility], [address] [place], [place] [address]   Troponin I (High Sensitivity)     Status: Abnormal   Collection Time: 02/21/22 10:33 AM  Result Value Ref Range   Troponin I (High Sensitivity) 268 (HH) <18 ng/L    Comment: CRITICAL VALUE NOTED. VALUE IS CONSISTENT WITH PREVIOUSLY REPORTED/CALLED VALUE.PMF (NOTE) Elevated high sensitivity troponin I (hsTnI) values and significant  changes across serial measurements may suggest ACS but many other  chronic and acute conditions are known to elevate hsTnI results.  Refer to the "Links" section for chest pain algorithms and additional  guidance. Performed at [facility], [address] [place], [place] [address]   Glucose, capillary     Status: None   Collection Time: 02/21/22 11:37 AM  Result Value Ref Range   Glucose-Capillary 70 70 - 99 mg/dL    Comment: Glucose reference range applies only to samples taken after fasting for at least 8 hours.  Lactic acid, plasma     Status: Abnormal   Collection Time: 02/21/22 12:58 PM  Result Value Ref Range   Lactic Acid, Venous 2.4 (HH) 0.5 - 1.9 mmol/L    Comment: CRITICAL VALUE NOTED. VALUE IS CONSISTENT WITH PREVIOUSLY REPORTED/CALLED VALUE.PMF Performed at [facility], [address] [place], [place] [address]   Troponin I (High Sensitivity)     Status: Abnormal   Collection Time: 02/21/22 12:58 PM  Result Value Ref Range   Troponin I (High Sensitivity) 258 (HH) <18 ng/L    Comment: CRITICAL VALUE NOTED. VALUE IS CONSISTENT WITH PREVIOUSLY REPORTED/CALLED VALUE.PMF (NOTE) Elevated high sensitivity troponin I (hsTnI) values and significant  changes across serial measurements may suggest ACS but many other  chronic and acute conditions are known to elevate hsTnI results.  Refer to the "Links" section for chest pain algorithms and additional  guidance. Performed at [facility], [address] [place], [place] [address]   Glucose, capillary     Status: Abnormal    Collection Time: 02/21/22  3:49 PM  Result Value Ref Range   Glucose-Capillary 62 (L) 70 - 99 mg/dL    Comment: Glucose reference range applies only to samples taken after fasting for at least 8 hours.  Glucose, capillary     Status: Abnormal   Collection Time: 02/21/22  4:24 PM  Result Value Ref Range   Glucose-Capillary 120 (H) 70 - 99 mg/dL    Comment: Glucose reference range applies only to samples taken after fasting for at least 8  hours.  Glucose, capillary     Status: None   Collection Time: 02/21/22  7:29 PM  Result Value Ref Range   Glucose-Capillary 84 70 - 99 mg/dL    Comment: Glucose reference range applies only to samples taken after fasting for at least 8 hours.  Glucose, capillary     Status: Abnormal   Collection Time: 02/21/22 11:38 PM  Result Value Ref Range   Glucose-Capillary 68 (L) 70 - 99 mg/dL    Comment: Glucose reference range applies only to samples taken after fasting for at least 8 hours.  Glucose, capillary     Status: None   Collection Time: 02/22/22 12:34 AM  Result Value Ref Range   Glucose-Capillary 98 70 - 99 mg/dL    Comment: Glucose reference range applies only to samples taken after fasting for at least 8 hours.  Glucose, capillary     Status: None   Collection Time: 02/22/22  3:44 AM  Result Value Ref Range   Glucose-Capillary 88 70 - 99 mg/dL    Comment: Glucose reference range applies only to samples taken after fasting for at least 8 hours.  CBC     Status: Abnormal   Collection Time: 02/22/22  4:34 AM  Result Value Ref Range   WBC 5.8 4.0 - 10.5 K/uL   RBC 2.77 (L) 4.22 - 5.81 MIL/uL   Hemoglobin 9.2 (L) 13.0 - 17.0 g/dL   HCT 16.1 (L) 09.6 - 04.5 %   MCV 98.6 80.0 - 100.0 fL   MCH 33.2 26.0 - 34.0 pg   MCHC 33.7 30.0 - 36.0 g/dL   RDW 40.9 (H) 81.1 - 91.4 %   Platelets 136 (L) 150 - 400 K/uL   nRBC 0.0 0.0 - 0.2 %    Comment: Performed at [facility], [address], [place], [place] [address]  Renal function panel      Status: Abnormal   Collection Time: 02/22/22  4:34 AM  Result Value Ref Range   Sodium 141 135 - 145 mmol/L   Potassium 3.7 3.5 - 5.1 mmol/L   Chloride 110 98 - 111 mmol/L   CO2 24 22 - 32 mmol/L   Glucose, Bld 99 70 - 99 mg/dL    Comment: Glucose reference range applies only to samples taken after fasting for at least 8 hours.   BUN 12 8 - 23 mg/dL   Creatinine, Ser 6.21 0.61 - 1.24 mg/dL   Calcium 8.6 (L) 8.9 - 10.3 mg/dL   Phosphorus 2.3 (L) 2.5 - 4.6 mg/dL   Albumin 2.5 (L) 3.5 - 5.0 g/dL   GFR, Estimated >30 >86 mL/min    Comment: (NOTE) Calculated using the CKD-EPI Creatinine Equation (2021)    Anion gap 7 5 - 15    Comment: Performed at [facility], [address] [place], [place] [address]  Procalcitonin     Status: None   Collection Time: 02/22/22  4:34 AM  Result Value Ref Range   Procalcitonin 0.61 ng/mL    Comment:        Interpretation: PCT > 0.5 ng/mL and <= 2 ng/mL: Systemic infection (sepsis) is possible, but other conditions are known to elevate PCT as well. (NOTE)       Sepsis PCT Algorithm           Lower Respiratory Tract  Infection PCT Algorithm    ----------------------------     ----------------------------         PCT < 0.25 ng/mL                PCT < 0.10 ng/mL          Strongly encourage             Strongly discourage   discontinuation of antibiotics    initiation of antibiotics    ----------------------------     -----------------------------       PCT 0.25 - 0.50 ng/mL            PCT 0.10 - 0.25 ng/mL               OR       >80% decrease in PCT            Discourage initiation of                                            antibiotics      Encourage discontinuation           of antibiotics    ----------------------------     -----------------------------         PCT >= 0.50 ng/mL              PCT 0.26 - 0.50 ng/mL                AND       <80% decrease in PCT             Encourage initiation  of                                             antibiotics       Encourage continuation           of antibiotics    ----------------------------     -----------------------------        PCT >= 0.50 ng/mL                  PCT > 0.50 ng/mL               AND         increase in PCT                  Strongly encourage                                      initiation of antibiotics    Strongly encourage escalation           of antibiotics                                     -----------------------------                                           PCT <= 0.25 ng/mL  OR                                        > 80% decrease in PCT                                      Discontinue / Do not initiate                                             antibiotics  Performed at [facility], [address] [place], [place] [address]   Glucose, capillary     Status: None   Collection Time: 02/22/22  7:33 AM  Result Value Ref Range   Glucose-Capillary 79 70 - 99 mg/dL    Comment: Glucose reference range applies only to samples taken after fasting for at least 8 hours.    Recent Results (from the past 240 hour(s))  MRSA Next Gen by PCR, Nasal     Status: None   Collection Time: 02/20/22  7:23 PM   Specimen: Nasal Mucosa; Nasal Swab  Result Value Ref Range Status   MRSA by PCR Next Gen NOT DETECTED NOT DETECTED Final    Comment: (NOTE) The GeneXpert MRSA Assay (FDA approved for NASAL specimens only), is one component of a comprehensive MRSA colonization surveillance program. It is not intended to diagnose MRSA infection nor to guide or monitor treatment for MRSA infections. Test performance is not FDA approved in patients less than 66 years old. Performed at [facility], [address] [place], [place] [address]   Culture, Respiratory w Gram Stain     Status: None (Preliminary result)   Collection Time: 02/21/22  5:30 AM    Specimen: Tracheal Aspirate; Respiratory  Result Value Ref Range Status   Specimen Description   Final    TRACHEAL ASPIRATE Performed at [facility], [address] [place], [place] [address]    Special Requests   Final    NONE Performed at [facility], [address] [place], [place] [address]    Gram Stain   Final    ABUNDANT GRAM POSITIVE COCCI IN PAIRS IN CHAINS FEW GRAM NEGATIVE COCCOBACILLI FEW WBC PRESENT, PREDOMINANTLY PMN    Culture   Final    CULTURE REINCUBATED FOR BETTER GROWTH Performed at [facility], [address] [place], [place] [address]    Report Status PENDING  Incomplete  Respiratory (~20 pathogens) panel by PCR     Status: None   Collection Time: 02/21/22  8:30 AM   Specimen: Nasopharyngeal Swab; Respiratory  Result Value Ref Range Status   Adenovirus NOT DETECTED NOT DETECTED Final   Coronavirus 229E NOT DETECTED NOT DETECTED Final    Comment: (NOTE) The Coronavirus on the Respiratory Panel, DOES NOT test for the novel  Coronavirus (2019 nCoV)    Coronavirus HKU1 NOT DETECTED NOT DETECTED Final   Coronavirus NL63 NOT DETECTED NOT DETECTED Final   Coronavirus OC43 NOT DETECTED NOT DETECTED Final   Metapneumovirus NOT DETECTED NOT DETECTED Final   Rhinovirus / Enterovirus NOT DETECTED NOT DETECTED Final   Influenza A NOT DETECTED NOT DETECTED Final   Influenza B NOT DETECTED NOT DETECTED  Final   Parainfluenza Virus 1 NOT DETECTED NOT DETECTED Final   Parainfluenza Virus 2 NOT DETECTED NOT DETECTED Final   Parainfluenza Virus 3 NOT DETECTED NOT DETECTED Final   Parainfluenza Virus 4 NOT DETECTED NOT DETECTED Final   Respiratory Syncytial Virus NOT DETECTED NOT DETECTED Final   Bordetella pertussis NOT DETECTED NOT DETECTED Final   Bordetella Parapertussis NOT DETECTED NOT DETECTED Final   Chlamydophila pneumoniae NOT DETECTED NOT DETECTED Final   Mycoplasma pneumoniae NOT DETECTED NOT DETECTED Final    Comment:  Performed at [facility], [address] [place], [place] [address]  Resp panel by RT-PCR (RSV, Flu A&B, Covid) Anterior Nasal Swab     Status: None   Collection Time: 02/21/22  8:30 AM   Specimen: Anterior Nasal Swab  Result Value Ref Range Status   SARS Coronavirus 2 by RT PCR NEGATIVE NEGATIVE Final    Comment: (NOTE) SARS-CoV-2 target nucleic acids are NOT DETECTED.  The SARS-CoV-2 RNA is generally detectable in upper respiratory specimens during the acute phase of infection. The lowest concentration of SARS-CoV-2 viral copies this assay can detect is 138 copies/mL. A negative result does not preclude SARS-Cov-2 infection and should not be used as the sole basis for treatment or other patient management decisions. A negative result may occur with  improper specimen collection/handling, submission of specimen other than nasopharyngeal swab, presence of viral mutation(s) within the areas targeted by this assay, and inadequate number of viral copies(<138 copies/mL). A negative result must be combined with clinical observations, patient history, and epidemiological information. The expected result is Negative.  Fact Sheet for Patients:  BloggerCourse.com  Fact Sheet for Healthcare Providers:  SeriousBroker.it  This test is no t yet approved or cleared by the [place] FDA and  has been authorized for detection and/or diagnosis of SARS-CoV-2 by FDA under an Emergency Use Authorization (EUA). This EUA will remain  in effect (meaning this test can be used) for the duration of the COVID-19 declaration under Section 564(b)(1) of the Act, 21 U.S.C.section 360bbb-3(b)(1), unless the authorization is terminated  or revoked sooner.       Influenza A by PCR NEGATIVE NEGATIVE Final   Influenza B by PCR NEGATIVE NEGATIVE Final    Comment: (NOTE) The Xpert Xpress SARS-CoV-2/FLU/RSV plus assay is intended as an aid in the  diagnosis of influenza from Nasopharyngeal swab specimens and should not be used as a sole basis for treatment. Nasal washings and aspirates are unacceptable for Xpert Xpress SARS-CoV-2/FLU/RSV testing.  Fact Sheet for Patients: BloggerCourse.com  Fact Sheet for Healthcare Providers: SeriousBroker.it  This test is not yet approved or cleared by the [place] FDA and has been authorized for detection and/or diagnosis of SARS-CoV-2 by FDA under an Emergency Use Authorization (EUA). This EUA will remain in effect (meaning this test can be used) for the duration of the COVID-19 declaration under Section 564(b)(1) of the Act, 21 U.S.C. section 360bbb-3(b)(1), unless the authorization is terminated or revoked.     Resp Syncytial Virus by PCR NEGATIVE NEGATIVE Final    Comment: (NOTE) Fact Sheet for Patients: BloggerCourse.com  Fact Sheet for Healthcare Providers: SeriousBroker.it  This test is not yet approved or cleared by the [place] FDA and has been authorized for detection and/or diagnosis of SARS-CoV-2 by FDA under an Emergency Use Authorization (EUA). This EUA will remain in effect (meaning this test can be used) for the duration of the COVID-19 declaration under Section 564(b)(1) of the Act, 21  U.S.C. section 360bbb-3(b)(1), unless the authorization is terminated or revoked.  Performed at [facility], [address] [place], [place] [address]     Lipid Panel No results for input(s): "CHOL", "TRIG", "HDL", "CHOLHDL", "VLDL", "LDLCALC" in the last 72 hours.  Studies/Results: MR BRAIN WO CONTRAST  Result Date: 02/21/2022 CLINICAL DATA:  Altered mental status.  Found down. EXAM: MRI HEAD WITHOUT CONTRAST TECHNIQUE: Multiplanar, multiecho pulse sequences of the brain and surrounding structures were obtained without intravenous contrast. COMPARISON:   10/02/2020 FINDINGS: Brain: No acute infarction, hemorrhage, hydrocephalus, extra-axial collection or mass lesion. 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 There is multifocal hyperintense T2-weighted signal within the periventricular and deep white matter. Generalized volume loss. The midline structures are normal. Vascular: Normal flow voids Skull and upper cervical spine: normal marrow signal. There is a right frontal scalp lipoma. Sinuses/Orbits: Paranasal sinuses are clear. No mastoid effusion. Normal orbits. Other: None IMPRESSION: 1. No acute intracranial abnormality. 2. 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 Electronically Signed   By: Deatra Robinson M.D.   On: 02/21/2022 18:47   DG Chest Port 1 View  Result Date: 02/21/2022 CLINICAL DATA:  Acute respiratory failure with hypoxia. EXAM: PORTABLE CHEST 1 VIEW COMPARISON:  01/31/2022 FINDINGS: Endotracheal tube is 7.2 centimeters above the carina. Enteric tube is in place, tip to the stomach. Heart size is normal. No focal consolidations. Minimal bibasilar subsegmental atelectasis. IMPRESSION: 1. Endotracheal tube is 7.2 centimeters above the carina. 2. Minimal bibasilar subsegmental atelectasis. Electronically Signed   By: Norva Pavlov M.D.   On: 02/21/2022 08:25   Rapid EEG  Result Date: 02/21/2022 Charlsie Quest, MD     02/21/2022  5:02 PM Patient Name: Edwen Mclester MRN: [id number] Epilepsy Attending: Charlsie Quest Referring Physician/Provider: Arnaldo Natal, MD Duration: 02/20/2022 2053 to 02/21/2022 1352 Patient history: 71yo M with ams in the setting of seizures. EEG to evaluate for seizure. Level of alertness: comatose AEDs during EEG study: LEV, versed Technical aspects: This EEG was obtained using a 10 lead EEG system positioned circumferentially without any parasagittal coverage (rapid EEG). Computer selected EEG is reviewed as  well as background features  and all clinically significant events. Description: EEG showed continuous generalized 3 to 6 Hz theta-delta slowing with overriding 15 to 18 Hz beta activity distributed symmetrically and diffusely. Hyperventilation and photic stimulation were not performed. Of note, parts of study were technically difficult due to significant electrode artifact.   ABNORMALITY - Continuous slow, generalized IMPRESSION: This limited ceribell study is suggestive of moderate to severe diffuse encephalopathy, nonspecific etiology but could be secondary to sedation. No seizures or epileptiform discharges were seen throughout the recording. Charlsie Quest   DG Chest Portable 1 View  Result Date: 02/20/2022 CLINICAL DATA:  Respiratory failure EXAM: PORTABLE CHEST 1 VIEW COMPARISON:  3:49 p.m. FINDINGS: Endotracheal tube is seen 5 cm above the carina. Nasogastric tube extends into the expected mid body of the stomach. Lung volumes are small, but are symmetric and are clear. Pulmonary insufflation is stable since prior examination. No pneumothorax or pleural effusion. Cardiac size within normal limits. Pulmonary vascularity is normal. IMPRESSION: 1. Support tubes in appropriate position. 2. Pulmonary hypoinflation. Electronically Signed   By: Helyn Numbers M.D.   On: 02/20/2022 16:55   CT Cervical Spine Wo Contrast  Result Date: 02/20/2022 CLINICAL DATA:  Trauma, found down EXAM: CT CERVICAL SPINE WITHOUT CONTRAST TECHNIQUE: Multidetector CT  imaging of the cervical spine was performed without intravenous contrast. Multiplanar CT image reconstructions were also generated. RADIATION DOSE REDUCTION: This exam was performed according to the departmental dose-optimization program which includes automated exposure control, adjustment of the mA and/or kV according to patient size and/or use of iterative reconstruction technique. COMPARISON:  03/07/2019 FINDINGS: Alignment: Alignment of posterior margins of vertebral bodies appears  normal. Skull base and vertebrae: No recent fracture is seen. Degenerative changes are noted, more severe at C5-C6 level. Soft tissues and spinal canal: There is no central spinal stenosis. There is extrinsic pressure over the ventral margin of thecal sac caused by bulging of annulus and bony spurs from C3-C7 levels. There is mild to moderate spinal stenosis at C5-C6 level. Disc levels: There is encroachment of neural foramina by bony spurs from C3-C7 levels. Upper chest: Unremarkable. Other: Endotracheal and enteric tube are noted in place. IMPRESSION: No recent fracture is seen in cervical spine. Cervical spondylosis, more severe at C5-C6 level. Electronically Signed   By: Ernie Avena M.D.   On: 02/20/2022 16:35   CT Head Wo Contrast  Result Date: 02/20/2022 CLINICAL DATA:  Found down, altered mental status EXAM: CT HEAD WITHOUT CONTRAST TECHNIQUE: Contiguous axial images were obtained from the base of the skull through the vertex without intravenous contrast. RADIATION DOSE REDUCTION: This exam was performed according to the departmental dose-optimization program which includes automated exposure control, adjustment of the mA and/or kV according to patient size and/or use of iterative reconstruction technique. COMPARISON:  Previous studies done on 10/02/2020 FINDINGS: Brain: There are no signs of bleeding within the cranium. There is interval appearance of moderate sized area of low-density in left posterior temporoparietal cortex. There is no effacement of adjacent cortical sulci or any significant mass effect. Cortical sulci are prominent. There is decreased density in periventricular white matter. Vascular: Unremarkable. Skull: No fracture is seen in calvarium. There is 1.3 cm area of focal thickening in the soft tissues in right frontal scalp with no significant interval change suggesting possible chronic benign process. Density measurements are suggestive of possible lipoma. Sinuses/Orbits:  There is mucosal thickening in ethmoid sinus. Frontal sinus is hypoplastic. Other: Orogastric tube is noted in place. IMPRESSION: There are no signs of bleeding within the cranium. There is interval appearance of moderate sized area of low-density in the left posterior parietal cortex, possibly old infarct. Less likely possibilities would include subacute infarct or edema surrounding a space-occupying lesion. Follow-up MRI as clinically warranted may be considered. Atrophy.  Small-vessel disease. Chronic sinusitis.  Possible lipoma in the right frontal scalp. Electronically Signed   By: Ernie Avena M.D.   On: 02/20/2022 16:30   DG Chest Portable 1 View  Result Date: 02/20/2022 CLINICAL DATA:  Post intubation EXAM: PORTABLE CHEST 1 VIEW COMPARISON:  Portable exam 1549 hours compared to 04/04/2019 FINDINGS: Tip of endotracheal tube projects 5.5 cm above carina. Normal heart size, mediastinal contours, and pulmonary vascularity. Lungs clear. No pulmonary infiltrate, pleural effusion, or pneumothorax. Osseous structures unremarkable. IMPRESSION: No acute abnormalities. Electronically Signed   By: Ulyses Southward M.D.   On: 02/20/2022 15:59    Medications: Scheduled:  Chlorhexidine Gluconate Cloth  6 each Topical Daily   docusate  100 mg Per Tube BID   heparin  5,000 Units Subcutaneous Q8H   insulin aspart  0-15 Units Subcutaneous Q4H   LORazepam  1 mg Intravenous Once   mouth rinse  15 mL Mouth Rinse Q2H   pantoprazole (PROTONIX) IV  40 mg  Intravenous QHS   phosphorus  500 mg Per Tube Q4H   polyethylene glycol  17 g Per Tube Daily   [START ON 02/23/2022] thiamine (VITAMIN B1) injection  100 mg Intravenous Q24H   Continuous:  ampicillin-sulbactam (UNASYN) IV 3 g (02/22/22 1015)   dextrose 5% lactated ringers 50 mL/hr at 02/22/22 0600   fentaNYL infusion INTRAVENOUS 25 mcg/hr (02/22/22 0600)   midazolam 2.5 mg/hr (02/22/22 0600)   thiamine (VITAMIN B1) injection Stopped (02/21/22 2325)     Assessment: 71 year old male with a history of HTN and heavy EtOH use who was brought in by EMS after being found down and unresponsive at home by a friend. He had 3 seizures with EMS during which eyes were deviated to the right. When not actively seizing his eyes were deviated to the left. EMS administered Versed en route. BP 212/124. Patient was postictal with eyes deviated to the left on arrival to the ED. ED staff then witnessed another seizure, with arms flexed and shaking, eyes deviated to the right. On exam by EDP after the last seizure, he would moan occasionally but did not follow commands. Gag reflex was absent. He was intubated for airway protection and received 7.5 mg Versed IV during intubation. After intubation, he was started on Versed drip at 5 mg/hr. EtOH level < 10 at presentation - CT head reveals a medium to large sized wedge-shaped hypodensity in the left parietal lobe, most likely representing a late subacute ischemic infarction. There are no signs of bleeding within the cranium.  - Ceribell EEG report 02/21/2022 2151 to 02/22/2022 3474: Continuous slow, generalized. This limited ceribell study is suggestive of moderate to severe diffuse encephalopathy, nonspecific etiology but could be secondary to sedation. No seizures or epileptiform discharges were seen throughout the recording. - Cer[facility]ell EEG report 02/20/2022 2053 to 02/21/2022 0800: Continuous generalized slowing. This limited ceribell study is suggestive of moderate to severe diffuse encephalopathy, nonspecific to etiology but could be secondary to sedation. No seizures or epileptiform discharges were seen throughout the recording. - Exam off sedation today is improved slightly from yesterday. Attempts to follow two motor commands, but otherwise not yet responding consistently.  - UDS was positive for cannabinoid and benzodiazepine - Overall impression: Complex partial status epilepticus, now resolved. Seizure focus most  likely in the left cerebral hemisphere. Most likely precipitated by EtOH withdrawal     Recommendations: - Continue scheduled Keppra dosing at 1000 mg IV BID. Given seizure recurrence overnight, it is too early to consider weaning off . Will need benzodiazepine coverage, preferably Versed, for EtOH withdrawal.  - CIWA protocol/EtOH withdrawal precautions - Inpatient seizure precautions - Ceribell EEG has been discontinued - MRI brain when stable.  - Agree with CCM regarding goal of extubating today.   35 minutes spent in the neurological evaluation and management of this critically ill patient  Addendum: - Restarting Versed gtt at 2 mg/hr for prevention of DTs and seizures - Discussed with CCM   LOS: 2 days   @Electronically  signed: Dr. Caryl Pina 02/22/2022  11:02 AM

## 2022-02-22 NOTE — Progress Notes (Signed)
Patient extubated to 2l [place], per md order, with no complications.

## 2022-02-22 NOTE — Progress Notes (Incomplete)
1023 Ceribell removed by intensivist.

## 2022-02-22 NOTE — Consult Note (Addendum)
PHARMACY CONSULT NOTE - FOLLOW UP  Pharmacy Consult for Electrolyte Monitoring and Replacement   Recent Labs: Potassium (mmol/L)  Date Value  02/22/2022 3.7   Magnesium (mg/dL)  Date Value  50/93/2671 1.7   Calcium (mg/dL)  Date Value  24/58/0998 8.6 (L)   Albumin (g/dL)  Date Value  33/82/5053 2.5 (L)   Phosphorus (mg/dL)  Date Value  97/67/3419 2.3 (L)   Sodium (mmol/L)  Date Value  02/22/2022 141     Assessment: 71 y.o. Male admitted with Acute Metabolic Encephalopathy in setting of recurrent Seizures requiring intubation for airway protection. Currently on unasyn for PNA.   On D5 @ 68ml/hr  Goal of Therapy:  WNL  Plan:  Kphos 500 mg x 2 via tube.  F/u with AM labs.   Ronnald Ramp ,PharmD Clinical Pharmacist 02/22/2022 8:54 AM

## 2022-02-23 ENCOUNTER — Inpatient Hospital Stay: Payer: 59

## 2022-02-23 DIAGNOSIS — I5032 Chronic diastolic (congestive) heart failure: Secondary | ICD-10-CM

## 2022-02-23 DIAGNOSIS — I5020 Unspecified systolic (congestive) heart failure: Secondary | ICD-10-CM

## 2022-02-23 DIAGNOSIS — R569 Unspecified convulsions: Secondary | ICD-10-CM | POA: Diagnosis not present

## 2022-02-23 HISTORY — DX: Unspecified systolic (congestive) heart failure: I50.20

## 2022-02-23 HISTORY — DX: Chronic diastolic (congestive) heart failure: I50.32

## 2022-02-23 LAB — CBC
HCT: 28.7 % — ABNORMAL LOW (ref 39.0–52.0)
Hemoglobin: 9.7 g/dL — ABNORMAL LOW (ref 13.0–17.0)
MCH: 33.8 pg (ref 26.0–34.0)
MCHC: 33.8 g/dL (ref 30.0–36.0)
MCV: 100 fL (ref 80.0–100.0)
Platelets: 149 10*3/uL — ABNORMAL LOW (ref 150–400)
RBC: 2.87 MIL/uL — ABNORMAL LOW (ref 4.22–5.81)
RDW: 19 % — ABNORMAL HIGH (ref 11.5–15.5)
WBC: 6.6 10*3/uL (ref 4.0–10.5)
nRBC: 0 % (ref 0.0–0.2)

## 2022-02-23 LAB — RENAL FUNCTION PANEL
Albumin: 2.6 g/dL — ABNORMAL LOW (ref 3.5–5.0)
Anion gap: 10 (ref 5–15)
BUN: 11 mg/dL (ref 8–23)
CO2: 22 mmol/L (ref 22–32)
Calcium: 8.7 mg/dL — ABNORMAL LOW (ref 8.9–10.3)
Chloride: 110 mmol/L (ref 98–111)
Creatinine, Ser: 0.93 mg/dL (ref 0.61–1.24)
GFR, Estimated: >60 mL/min (ref >60)
Glucose, Bld: 95 mg/dL (ref 70–99)
Phosphorus: 4.1 mg/dL (ref 2.5–4.6)
Potassium: 3.2 mmol/L — ABNORMAL LOW (ref 3.5–5.1)
Sodium: 142 mmol/L (ref 135–145)

## 2022-02-23 LAB — GLUCOSE, CAPILLARY
Glucose-Capillary: 87 mg/dL (ref 70–99)
Glucose-Capillary: 98 mg/dL (ref 70–99)
Glucose-Capillary: 90 mg/dL (ref 70–99)
Glucose-Capillary: 97 mg/dL (ref 70–99)
Glucose-Capillary: 106 mg/dL — ABNORMAL HIGH (ref 70–99)
Glucose-Capillary: 100 mg/dL — ABNORMAL HIGH (ref 70–99)

## 2022-02-23 LAB — CULTURE, RESPIRATORY W GRAM STAIN: Culture: NORMAL

## 2022-02-23 LAB — MAGNESIUM: Magnesium: 1.8 mg/dL (ref 1.7–2.4)

## 2022-02-23 LAB — BRAIN NATRIURETIC PEPTIDE: B Natriuretic Peptide: 1539 pg/mL — ABNORMAL HIGH (ref 0.0–100.0)

## 2022-02-23 MED ORDER — ORAL CARE MOUTH RINSE: 15.0000 mL | OROMUCOSAL | Status: DC | PRN

## 2022-02-23 MED ORDER — LORAZEPAM 1 MG PO TABS: 1.0000 mg | ORAL_TABLET | ORAL | Status: AC | PRN

## 2022-02-23 MED ORDER — LORAZEPAM 2 MG/ML IJ SOLN
1.0000 mg | INTRAMUSCULAR | Status: AC | PRN
  Administered 2022-02-23 – 2022-02-24 (×2): 2 mg via INTRAVENOUS
  Filled 2022-02-23 (×2): qty 1

## 2022-02-23 MED ORDER — ADULT MULTIVITAMIN W/MINERALS CH
1.0000 | ORAL_TABLET | Freq: Every day | ORAL | Status: DC
  Administered 2022-02-23 – 2022-03-03 (×8): 1 via ORAL
  Filled 2022-02-23 (×9): qty 1

## 2022-02-23 MED ORDER — FUROSEMIDE 10 MG/ML IJ SOLN
40.0000 mg | Freq: Once | INTRAMUSCULAR | Status: AC
  Administered 2022-02-23: 40 mg via INTRAVENOUS
  Filled 2022-02-23: qty 4

## 2022-02-23 MED ORDER — MAGNESIUM SULFATE 2 GM/50ML IV SOLN
2.0000 g | Freq: Once | INTRAVENOUS | Status: AC
  Administered 2022-02-23: 2 g via INTRAVENOUS
  Filled 2022-02-23: qty 50

## 2022-02-23 MED ORDER — FOLIC ACID 1 MG PO TABS
1.0000 mg | ORAL_TABLET | Freq: Every day | ORAL | Status: DC
  Administered 2022-02-23 – 2022-03-03 (×8): 1 mg via ORAL
  Filled 2022-02-23 (×9): qty 1

## 2022-02-23 MED ORDER — POTASSIUM CHLORIDE 10 MEQ/100ML IV SOLN
10.0000 meq | INTRAVENOUS | Status: AC
  Administered 2022-02-23 (×3): 10 meq via INTRAVENOUS
  Filled 2022-02-23 (×3): qty 100

## 2022-02-23 NOTE — Progress Notes (Addendum)
NAME:  John Spears, MRN:  [id number], DOB:  1951/02/17, LOS: 3 ADMISSION DATE:  02/20/2022, CONSULTATION DATE:  02/20/2022 REFERRING MD:  Dr. Cinda Quest, CHIEF COMPLAINT:  Unresponsive, Seizures   Brief Pt Description / Synopsis:  72 y.o. Male admitted with Acute Metabolic Encephalopathy in setting of recurrent Seizures requiring intubation for airway protection.  History of Present Illness:  John Spears is a 73 year old male with a past medical history significant for hypertension and alcohol abuse who presented to [facility] ED on 02/20/2022 after being found down and unresponsive by a friend at home.  Patient is currently intubated and sedated, unable to contribute to history, no family is currently available, therefore history is obtained from chart review.  Per ED and nursing notes, the patient's friend contacted EMS as he found the patient down on the floor, unresponsive.  Last known well time is unknown.  According to EMS, the patient had 3 witnessed seizures during which the eyes were deviated to the right.  When not actively seizing his eyes were deviated to the left.  EMS administered Versed en route.  Upon arrival to the ED, the patient was noted to be postictal.  While in the ED he again had another seizure and was unable to protect his airway, therefore he was subsequent intubated for airway protection by ED provider.  ED Course: Initial Vital Signs: Temperature 97.8 F axillary, respiratory rate 23, pulse 141, blood pressure 163/101, SpO2 99% Significant Labs: Bicarb 16, anion gap 20, lactic acid greater than 9, glucose 163, BUN 11, creatinine 1.1, AST 69, high-sensitivity troponin 27, WBC 7.9, hemoglobin 9.6, hematocrit 30.8, MCV 108.5, RDW 17.8, serum Tylenol less than 10, salicylates less than 7, ethyl alcohol less than 10 ABG postintubation: pH 7.37/pCO2 25/pO2 115/bicarb 14.5 Imaging Chest X-ray>>FINDINGS: Tip of endotracheal tube projects 5.5 cm above carina. Normal heart size,  mediastinal contours, and pulmonary vascularity. Lungs clear. No pulmonary infiltrate, pleural effusion, or pneumothorax. CT head>> IMPRESSION: There are no signs of bleeding within the cranium. There is interval appearance of moderate sized area of low-density in the left posterior parietal cortex, possibly old infarct. Less likely possibilities would include subacute infarct or edema surrounding a space-occupying lesion. Follow-up MRI as clinically warranted may be considered. Atrophy.  Small-vessel disease. Chronic sinusitis.  Possible lipoma in the right frontal scalp.  CT Cervical spine>>IMPRESSION: No recent fracture is seen in cervical spine. Cervical spondylosis, more severe at C5-C6 level. Medications Administered: 7.5 mg Versed during intubation  02/22/22- patient is on SBT , he is not following commands just yet but he is passing pulmonary mechanics on ventilator.  02/23/21- patient is s/p liberation from MV yesterday, have passed bedside swallow.  No additional seizure activity.  Patient being optimized for TRH transfer.   Pertinent  Medical History   Past Medical History:  Diagnosis Date   Hypertension     Micro Data:  12/29: SARS-CoV-2 & Influenza PCR>> 12/29: MRSA PCR>> 12/29: Tracheal aspirate>>  Antimicrobials:  Unasyn 12/29>>  Significant Hospital Events: Including procedures, antibiotic start and stop dates in addition to other pertinent events   12/29: Presented to ED, required intubation for airway protection.  PCCM asked to admit.  Neuro consulted, to place Cerebell 02/21/22- patient remains on keppra intubated.  He was seen by neurology and there is plan for MRI. I met with family today and reviewed medical plan answered questions.   Objective   Blood pressure (!) 155/86, pulse 90, temperature 99.3 F (37.4 C), resp. rate 20, height 5'  11" (1.803 m), weight 78.9 kg, SpO2 100 %.        Intake/Output Summary (Last 24 hours) at 02/23/2022 1411 Last data  filed at 02/23/2022 1400 Gross per 24 hour  Intake 1613.38 ml  Output 475 ml  Net 1138.38 ml    Filed Weights   02/21/22 0500 02/22/22 0500 02/23/22 0416  Weight: 80.9 kg 78.7 kg 78.9 kg    Examination: General: age appropriate NAD HENT: Atraumatic, normocephalic, neck supple, no JVD Lungs: Coarse rhonchi throughout, Cardiovascular: Tachycardia, regular rhythm, S1-S2, no murmurs, rubs or gallops Abdomen: Soft, nontender, nondistended, no guarding room tenderness, bowel sounds positive x 4 Extremities: Normal bulk and tone, no deformities, no edema Neuro: no FND grossly pupils PERRLA GU: Foley catheter in place  Resolved Hospital Problem list     Assessment & Plan:    # Aspiration pneumonia --on nasal canula  -s/p passing of swallow evaluation  -on unasyn day #3  #Mildly elevated Troponin, suspect demand ischemia PMHx: Hypertension -Continuous cardiac monitoring -Maintain MAP >65 -IV fluids -Vasopressors as needed to maintain MAP goal -Trend lactic acid until normalized -Trend HS Troponin until peaked   #AG Metabolic Acidosis #Lactic Acidosis, suspect due to recurrent seizures -Monitor I&O's / urinary output -Follow BMP -Ensure adequate renal perfusion -Avoid nephrotoxic agents as able -Replace electrolytes as indicated -IV fluids -Low dose Bicarb gtt -Trend lactic  #Hyperglycemia -CBG's q4h; Target range of 140 to 180 -SSI -Follow ICU Hypo/Hyperglycemia protocol  #Delerium Tremens - RESOLVED  -S/P FULL SCOPE OF THERAPY  WITH SEDATION WHILE ON VENTILATOR       Best Practice (right click and "Reselect all SmartList Selections" daily)   Diet/type: NPO DVT prophylaxis: prophylactic heparin  GI prophylaxis: PPI Lines: N/A Foley:  Yes, and it is still needed Code Status:  full code Last date of multidisciplinary goals of care discussion [N/A]  Labs   CBC: Recent Labs  Lab 02/20/22 1547 02/21/22 0418 02/22/22 0434 02/23/22 0431  WBC 7.9 6.5  5.8 6.6  NEUTROABS 3.5  --   --   --   HGB 9.6* 9.4* 9.2* 9.7*  HCT 30.8* 27.8* 27.3* 28.7*  MCV 108.5* 98.9 98.6 100.0  PLT 201 142* 136* 149*     Basic Metabolic Panel: Recent Labs  Lab 02/20/22 1547 02/20/22 2039 02/21/22 0418 02/21/22 1032 02/22/22 0434 02/23/22 0431  NA 142 140 139  --  141 142  K 4.2 5.6* 4.0  --  3.7 3.2*  CL 106 106 107  --  110 110  CO2 16* 21* 21*  --  24 22  GLUCOSE 163* 113* 97  --  99 95  BUN _0 --  12 11  CREATININE 1.10 0.98 0.93  --  0.98 0.93  CALCIUM 9.3 8.7* 8.5*  --  8.6* 8.7*  MG  --   --   --  1.7  --  1.8  PHOS  --   --  3.7  --  2.3* 4.1    GFR: Estimated Creatinine Clearance: 79.8 mL/min (by C-G formula based on SCr of 0.93 mg/dL). Recent Labs  Lab 02/20/22 1547 02/20/22 1746 02/20/22 1747 02/20/22 1959 02/21/22 0418 02/21/22 1033 02/21/22 1258 02/22/22 0434 02/23/22 0431  PROCALCITON  --  0.11  --   --  0.65  --   --  0.61  --   WBC 7.9  --   --   --  6.5  --   --  5.8 6.6  LATICACIDVEN  --   --  >  9.0* 7.4*  --  2.6* 2.4*  --   --      Liver Function Tests: Recent Labs  Lab 02/20/22 1547 02/21/22 0418 02/22/22 0434 02/23/22 0431  AST 69*  --   --   --   ALT 20  --   --   --   ALKPHOS 63  --   --   --   BILITOT 0.8  --   --   --   PROT 6.2*  --   --   --   ALBUMIN 3.4* 2.6* 2.5* 2.6*    Recent Labs  Lab 02/20/22 1547  LIPASE 38    No results for input(s): "AMMONIA" in the last 168 hours.  ABG    Component Value Date/Time   PHART 7.41 02/21/2022 0500   PCO2ART 39 02/21/2022 0500   PO2ART 142 (H) 02/21/2022 0500   HCO3 24.7 02/21/2022 0500   TCO2 23 10/02/2020 1707   ACIDBASEDEF 9.0 (H) 02/20/2022 1644   O2SAT 99.7 02/21/2022 0500     Coagulation Profile: Recent Labs  Lab 02/20/22 1547  INR 1.2     Cardiac Enzymes: No results for input(s): "CKTOTAL", "CKMB", "CKMBINDEX", "TROPONINI" in the last 168 hours.  HbA1C: Hgb A1c MFr Bld  Date/Time Value Ref Range Status  03/08/2019  12:34 PM 5.0 4.8 - 5.6 % Final    Comment:    (NOTE) Pre diabetes:          5.7%-6.4% Diabetes:              >6.4% Glycemic control for   <7.0% adults with diabetes   04/06/2016 04:43 AM 5.1 4.8 - 5.6 % Final    Comment:    (NOTE)         Pre-diabetes: 5.7 - 6.4         Diabetes: >6.4         Glycemic control for adults with diabetes: <7.0     CBG: Recent Labs  Lab 02/22/22 1951 02/22/22 2319 02/23/22 0401 02/23/22 0731 02/23/22 1146  GLUCAP 94 91 87 98 90     Review of Systems:   Unable to assess due to intubation/sedation/critical illness   Past Medical History:  He,  has a past medical history of Hypertension.   Surgical History:   Past Surgical History:  Procedure Laterality Date   JOINT REPLACEMENT       Social History:   reports that he has quit smoking. He has never used smokeless tobacco. He reports current alcohol use of about 28.0 standard drinks of alcohol per week. He reports current drug use. Drug: Marijuana.   Family History:  His family history is not on file.   Allergies No Known Allergies   Home Medications  Prior to Admission medications   Medication Sig Start Date End Date Taking? Authorizing Provider  acetaminophen (TYLENOL) 325 MG tablet Take 2 tablets (650 mg total) by mouth every 6 (six) hours as needed for mild pain or fever (or Fever >/= 101). 03/14/19   Wyvonnia Dusky, MD  amLODipine (NORVASC) 10 MG tablet Take 1 tablet (10 mg total) by mouth daily. 04/04/19 05/04/19  Lavonia Drafts, MD  aspirin EC 81 MG EC tablet Take 1 tablet (81 mg total) by mouth daily. 03/15/19   Wyvonnia Dusky, MD  famotidine (PEPCID) 20 MG tablet Take 1 tablet (20 mg total) by mouth 2 (two) times daily. 12/04/20   Carrie Mew, MD  hydrALAZINE (APRESOLINE) 50 MG tablet Take 1 tablet (  50 mg total) by mouth every 8 (eight) hours. 04/04/19 05/04/19  Lavonia Drafts, MD  hydrochlorothiazide (HYDRODIURIL) 25 MG tablet Take 25 mg by mouth daily. 04/08/20    [provider]  metoprolol succinate (TOPROL-XL) 25 MG 24 hr tablet Take 1 tablet (25 mg total) by mouth daily. 04/04/19 05/04/19  Lavonia Drafts, MD  potassium chloride (KLOR-CON) 10 MEQ tablet Take 10 mEq by mouth daily. 06/05/20   [provider]     Critical care provider statement:   Total critical care time: 33 minutes   Performed by: Lanney Gins MD   Critical care time was exclusive of separately billable procedures and treating other patients.   Critical care was necessary to treat or prevent imminent or life-threatening deterioration.   Critical care was time spent personally by me on the following activities: development of treatment plan with patient and/or surrogate as well as nursing, discussions with consultants, evaluation of patient's response to treatment, examination of patient, obtaining history from patient or surrogate, ordering and performing treatments and interventions, ordering and review of laboratory studies, ordering and review of radiographic studies, pulse oximetry and re-evaluation of patient's condition.    Ottie Glazier, M.D.  Pulmonary & Critical Care Medicine

## 2022-02-23 NOTE — Progress Notes (Signed)
Patient's swallowing tested. Strong movement of jaw, tongue, and swallowing. He was able to manage a water soaked swab then ice chips then sips of water well. Notified Dana NP of successful swallow eval, diet transitioned to clear liquids and will continue to monitor.

## 2022-02-23 NOTE — Consult Note (Signed)
PHARMACY CONSULT NOTE - FOLLOW UP  Pharmacy Consult for Electrolyte Monitoring and Replacement   Recent Labs: Potassium (mmol/L)  Date Value  02/23/2022 3.2 (L)   Magnesium (mg/dL)  Date Value  02/23/2022 1.8   Calcium (mg/dL)  Date Value  02/23/2022 8.7 (L)   Albumin (g/dL)  Date Value  02/23/2022 2.6 (L)   Phosphorus (mg/dL)  Date Value  02/23/2022 4.1   Sodium (mmol/L)  Date Value  02/23/2022 142     Assessment: 72 y.o. Male admitted with Acute Metabolic Encephalopathy in setting of recurrent Seizures requiring intubation for airway protection. Currently on unasyn for PNA.  -hx alcoholism  On D5 @ 15ml/hr  Goal of Therapy:  WNL  Plan:  K 3.2- Will order KCL 10 meq IV x 3 Mag 1.8- Will order Magnesium sulfate 2 gm IV x 1 F/u with AM labs.   Noralee Space ,PharmD Clinical Pharmacist 02/23/2022 7:59 AM

## 2022-02-23 NOTE — Progress Notes (Signed)
Patient complained of headache and being cold to sister, sister verbalized this to this RN and it was confirmed by the patient. Ice packs and tylenol were offered to the patient but refused any intervention other than shutting off the lights and taking a nap. Will continue to monitor for any other signs of withdrawal.

## 2022-02-23 NOTE — Plan of Care (Signed)

## 2022-02-24 ENCOUNTER — Inpatient Hospital Stay (HOSPITAL_COMMUNITY)
Admit: 2022-02-24 | Discharge: 2022-02-24 | Disposition: A | Discharge status: Home / Self Care | Payer: 59 | Attending: Critical Care Medicine | Admitting: Critical Care Medicine

## 2022-02-24 DIAGNOSIS — I10 Essential (primary) hypertension: Secondary | ICD-10-CM | POA: Diagnosis not present

## 2022-02-24 DIAGNOSIS — R7989 Other specified abnormal findings of blood chemistry: Secondary | ICD-10-CM

## 2022-02-24 DIAGNOSIS — R569 Unspecified convulsions: Secondary | ICD-10-CM | POA: Diagnosis not present

## 2022-02-24 LAB — RENAL FUNCTION PANEL
Albumin: 2.5 g/dL — ABNORMAL LOW (ref 3.5–5.0)
Anion gap: 14 (ref 5–15)
BUN: 13 mg/dL (ref 8–23)
CO2: 24 mmol/L (ref 22–32)
Calcium: 8.7 mg/dL — ABNORMAL LOW (ref 8.9–10.3)
Chloride: 104 mmol/L (ref 98–111)
Creatinine, Ser: 1.09 mg/dL (ref 0.61–1.24)
GFR, Estimated: >60 mL/min (ref >60)
Glucose, Bld: 95 mg/dL (ref 70–99)
Phosphorus: 3.4 mg/dL (ref 2.5–4.6)
Potassium: 3 mmol/L — ABNORMAL LOW (ref 3.5–5.1)
Sodium: 142 mmol/L (ref 135–145)

## 2022-02-24 LAB — CBC
HCT: 27.5 % — ABNORMAL LOW (ref 39.0–52.0)
Hemoglobin: 9 g/dL — ABNORMAL LOW (ref 13.0–17.0)
MCH: 33.2 pg (ref 26.0–34.0)
MCHC: 32.7 g/dL (ref 30.0–36.0)
MCV: 101.5 fL — ABNORMAL HIGH (ref 80.0–100.0)
Platelets: 136 10*3/uL — ABNORMAL LOW (ref 150–400)
RBC: 2.71 MIL/uL — ABNORMAL LOW (ref 4.22–5.81)
RDW: 18.2 % — ABNORMAL HIGH (ref 11.5–15.5)
WBC: 8.5 10*3/uL (ref 4.0–10.5)
nRBC: 0.2 % (ref 0.0–0.2)

## 2022-02-24 LAB — ECHOCARDIOGRAM COMPLETE
AR max vel: 2.32 cm2
AV Area VTI: 2.28 cm2
AV Area mean vel: 2.22 cm2
AV Mean grad: 3 mmHg
AV Peak grad: 5.3 mmHg
Ao pk vel: 1.15 m/s
Area-P 1/2: 5.54 cm2
Calc EF: 30.6 %
Height: 71 in
MV M vel: 2.98 m/s
MV Peak grad: 35.4 mmHg
S' Lateral: 3.4 cm
Single Plane A2C EF: 29.7 %
Single Plane A4C EF: 34.2 %
Weight: 2783.09 oz

## 2022-02-24 LAB — GLUCOSE, CAPILLARY
Glucose-Capillary: 120 mg/dL — ABNORMAL HIGH (ref 70–99)
Glucose-Capillary: 122 mg/dL — ABNORMAL HIGH (ref 70–99)
Glucose-Capillary: 130 mg/dL — ABNORMAL HIGH (ref 70–99)
Glucose-Capillary: 62 mg/dL — ABNORMAL LOW (ref 70–99)
Glucose-Capillary: 81 mg/dL (ref 70–99)
Glucose-Capillary: 86 mg/dL (ref 70–99)
Glucose-Capillary: 87 mg/dL (ref 70–99)

## 2022-02-24 LAB — HEMOGLOBIN A1C
Hgb A1c MFr Bld: 5 % (ref 4.8–5.6)
Mean Plasma Glucose: 97 mg/dL

## 2022-02-24 LAB — IRON AND TIBC
Iron: 24 ug/dL — ABNORMAL LOW (ref 45–182)
Saturation Ratios: 13 % — ABNORMAL LOW (ref 17.9–39.5)
TIBC: 182 ug/dL — ABNORMAL LOW (ref 250–450)
UIBC: 158 ug/dL

## 2022-02-24 LAB — FOLATE: Folate: 9.4 ng/mL (ref >5.9)

## 2022-02-24 LAB — VITAMIN D 25 HYDROXY (VIT D DEFICIENCY, FRACTURES): Vit D, 25-Hydroxy: 6.19 ng/mL — ABNORMAL LOW (ref 30–100)

## 2022-02-24 LAB — VITAMIN B12: Vitamin B-12: 304 pg/mL (ref 180–914)

## 2022-02-24 LAB — MAGNESIUM: Magnesium: 1.7 mg/dL (ref 1.7–2.4)

## 2022-02-24 MED ORDER — ACETAMINOPHEN 325 MG PO TABS
650.0000 mg | ORAL_TABLET | Freq: Four times a day (QID) | ORAL | Status: DC | PRN
  Administered 2022-02-24 – 2022-03-02 (×7): 650 mg via ORAL
  Filled 2022-02-24 (×7): qty 2

## 2022-02-24 MED ORDER — POTASSIUM CHLORIDE 10 MEQ/100ML IV SOLN
10.0000 meq | INTRAVENOUS | Status: AC
  Administered 2022-02-24 (×4): 10 meq via INTRAVENOUS
  Filled 2022-02-24 (×4): qty 100

## 2022-02-24 MED ORDER — AMLODIPINE BESYLATE 10 MG PO TABS
10.0000 mg | ORAL_TABLET | Freq: Every day | ORAL | Status: DC
  Administered 2022-02-25: 10 mg via ORAL
  Filled 2022-02-24 (×3): qty 1

## 2022-02-24 MED ORDER — POTASSIUM CHLORIDE CRYS ER 20 MEQ PO TBCR: 40.0000 meq | EXTENDED_RELEASE_TABLET | Freq: Once | ORAL | Status: DC

## 2022-02-24 MED ORDER — IBUPROFEN 400 MG PO TABS
400.0000 mg | ORAL_TABLET | Freq: Four times a day (QID) | ORAL | Status: DC | PRN
  Administered 2022-02-24: 400 mg via ORAL
  Filled 2022-02-24: qty 1

## 2022-02-24 MED ORDER — PERFLUTREN LIPID MICROSPHERE
1.0000 mL | INTRAVENOUS | Status: AC | PRN
  Administered 2022-02-24: 2 mL via INTRAVENOUS

## 2022-02-24 NOTE — Progress Notes (Signed)
[facility] Progress Note  Patient: John Spears    HCW:[id number]  DOA: 02/20/2022     Date of Service: the patient was seen and examined on 02/24/2022  Chief Complaint  Patient presents with   Seizures   Brief hospital course: John Spears is a 72 year old male with a past medical history significant for hypertension and alcohol abuse who presented to [facility] ED on 02/20/2022 after being found down and unresponsive by a friend at home.  Patient was intubated and admitted into PCCM, neurology was consulted.  Patient has history of EtOH abuse, suspected withdrawal syndrome/delirium tremens.  Patient was extubated on 02/22/2022.  Patient was stabilized in the ICU and downgraded under hospitalist care on 02/24/22   Assessment and Plan:  # Aspiration pneumonia --on nasal canula  12/29 started  unasyn  Follow SLP eval and advance diet    #Mildly elevated Troponin, suspect demand ischemia PMHx: Hypertension -Continuous cardiac monitoring -Maintain MAP >65 -s/p IV fluids S/p Vasopressors as needed to maintain MAP goal -Trend lactic acid until normalized -Troponin peaked 291, trending down. Follow 2D echocardiogram   #AG Metabolic Acidosis, Resolved  #Lactic Acidosis, suspect due to recurrent seizures -Monitor I&O's / urinary output -Follow BMP -Ensure adequate renal perfusion -Avoid nephrotoxic agents as able -Replace electrolytes as indicated -IV fluids -Low dose Bicarb gtt -Trend lactic   #Hyperglycemia -CBG's q4h; Target range of 140 to 180 -SSI -Follow ICU Hypo/Hyperglycemia protocol   Seizure disorder Continue Keppra 1 g IV q. 12 hourly, transition to oral when cleared by SLP Continue fall precaution and seizure precautions Follow neurology   #Delerium Tremens - RESOLVED  -S/P FULL SCOPE OF THERAPY  WITH SEDATION WHILE ON VENTILATOR   Iron deficiency anemia, transferrin saturation 13%, we will start oral iron supplement.  Patient is able to  swallow    Body mass index is 24.26 kg/m.  Interventions:       Diet: NPO, awaiting for SLP eval DVT Prophylaxis: Subcutaneous Heparin    Advance goals of care discussion: Full code  Family Communication: family was present at bedside, at the time of interview.  The pt provided permission to discuss medical plan with the family. Opportunity was given to ask question and all questions were answered satisfactorily.   Disposition:  Pt is from hOME, admitted with seizures and delirium tremens s/p intubation, still has AMS AAO x 2, dysphagia, awaiting for SLP eval, which precludes a safe discharge. Discharge to home, when may need few more days to recover..  Subjective: No significant overnight events, patient was seen in the ICU, downgraded to hospitalist service today.  Patient is AAO x 2, feeling generalized weakness and is still slow to respond, not back to his baseline.  Unable to offer any complaints, seems to be resting comfortably. Patient's family was at bedside.  Physical Exam: General:  alert oriented to place and person.  Appear in mild distress, affect appropriate Eyes: PERRLa ENT: Oral Mucosa Clear, moist  Neck: NO JVD,  Cardiovascular: S1 and S2 Present, NO Murmur,  Respiratory: good respiratory effort, Bilateral Air entry equal and Decreased, NO Crackles, NO wheezes Abdomen: Bowel Sound present, Soft and no tenderness,  Skin: no rashes Extremities: no Pedal edema, no calf tenderness Neurologic: without any new focal findings Gait not checked due to patient safety concerns  Vitals:   02/24/22 1000 02/24/22 1100 02/24/22 1231 02/24/22 1357  BP: (!) 156/95 (!) 163/99 (!) 146/92 (!) 158/96  Pulse: (!) 103 95  94  Resp: 15 (!) 24 (!)  21 18  Temp:   99.4 F (37.4 C) 99 F (37.2 C)  TempSrc:   Axillary   SpO2: 100% 97% 98% 99%  Weight:      Height:        Intake/Output Summary (Last 24 hours) at 02/24/2022 1423 Last data filed at 02/24/2022 1326 Gross per 24  hour  Intake 969.31 ml  Output 1750 ml  Net -780.69 ml   Filed Weights   02/22/22 0500 02/23/22 0416 02/24/22 0410  Weight: 78.7 kg 78.9 kg 78.9 kg    Data Reviewed: I have personally reviewed and interpreted daily labs, tele strips, imagings as discussed above. I reviewed all nursing notes, pharmacy notes, vitals, pertinent old records I have discussed plan of care as described above with RN and patient/family.  CBC: Recent Labs  Lab 02/20/22 1547 02/21/22 0418 02/22/22 0434 02/23/22 0431 02/24/22 0426  WBC 7.9 6.5 5.8 6.6 8.5  NEUTROABS 3.5  --   --   --   --   HGB 9.6* 9.4* 9.2* 9.7* 9.0*  HCT 30.8* 27.8* 27.3* 28.7* 27.5*  MCV 108.5* 98.9 98.6 100.0 101.5*  PLT 201 142* 136* 149* 623*   Basic Metabolic Panel: Recent Labs  Lab 02/20/22 2039 02/21/22 0418 02/21/22 1032 02/22/22 0434 02/23/22 0431 02/24/22 0426  NA 140 139  --  141 142 142  K 5.6* 4.0  --  3.7 3.2* 3.0*  CL 106 107  --  110 110 104  CO2 21* 21*  --  24 22 24   GLUCOSE 113* 97  --  99 95 95  BUN 11 11  --  12 11 13   CREATININE 0.98 0.93  --  0.98 0.93 1.09  CALCIUM 8.7* 8.5*  --  8.6* 8.7* 8.7*  MG  --   --  1.7  --  1.8 1.7  PHOS  --  3.7  --  2.3* 4.1 3.4    Studies: DG Chest Port 1 View  Result Date: 02/23/2022 CLINICAL DATA:  Aspiration. EXAM: PORTABLE CHEST 1 VIEW COMPARISON:  02/21/2022 FINDINGS: This is a low volume study. The cardiomediastinal silhouette is unchanged. Endotracheal tube and NG tube have been removed. New LEFT LOWER lung opacity is noted. No pneumothorax or large pleural effusion noted. No acute bony abnormalities are present. IMPRESSION: New LEFT LOWER lung opacity which may represent aspiration, pneumonia and/or atelectasis. Electronically Signed   By: Margarette Canada M.D.   On: 02/23/2022 15:04    Scheduled Meds:  amLODipine  10 mg Oral Daily   Chlorhexidine Gluconate Cloth  6 each Topical Daily   docusate  100 mg Per Tube BID   folic acid  1 mg Oral Daily   heparin   5,000 Units Subcutaneous Q8H   insulin aspart  0-15 Units Subcutaneous Q4H   LORazepam  1 mg Intravenous Once   metoprolol tartrate  2.5 mg Intravenous Once   multivitamin with minerals  1 tablet Oral Daily   pantoprazole (PROTONIX) IV  40 mg Intravenous QHS   polyethylene glycol  17 g Per Tube Daily   thiamine (VITAMIN B1) injection  100 mg Intravenous Q24H   Continuous Infusions:  ampicillin-sulbactam (UNASYN) IV Stopped (02/24/22 1024)   levETIRAcetam Stopped (02/24/22 0903)   potassium chloride     PRN Meds: docusate sodium, LORazepam **OR** LORazepam, metoprolol tartrate, polyethylene glycol  Time spent: 35 minutes  Author: Val Riles. MD [facility] 02/24/2022 2:23 PM  To reach On-call, see care teams to locate the attending and reach  out to them via [email or web address]. If 7PM-7AM, please contact night-coverage If you still have difficulty reaching the attending provider, please page the [facility] (Director on Call) for [facility] on amion for assistance.

## 2022-02-24 NOTE — Progress Notes (Signed)
Dr Dwyane Dee notified of increase temperature.  New order received for ibuprofen.  Plan continue to assess vital signs.

## 2022-02-24 NOTE — Evaluation (Signed)
Physical Therapy Evaluation Patient Details Name: John Spears MRN: [id number] DOB: 1950/03/30 Today's Date: 02/24/2022  History of Present Illness  Pt is a 72 year old male with a past medical history significant for hypertension and alcohol abuse who presented to [facility] ED on 02/20/2022 after being found down and unresponsive.  MD assessment includes: aspiration pneumonia, mildly elevated troponin with suspected demand ischemia, lactic acidosis with recurrent seizures, and hyperglycemia.   Clinical Impression  Pt somewhat lethargic with decreased level of alertness/cognition and unable to provide history.  Pt was able to follow some simple 1-step commands with extra time and cuing but in general required extensive physical assist with functional tasks. Pt was able to come to standing with max A and once in standing was able to maintain static standing position with only occasional min A for stability but was unable to advance either LE during attempts at ambulation.  Pt will benefit from PT services in a SNF setting upon discharge to safely address deficits listed in patient problem list for decreased caregiver assistance and eventual return to PLOF.      Recommendations for follow up therapy are one component of a multi-disciplinary discharge planning process, led by the attending physician.  Recommendations may be updated based on patient status, additional functional criteria and insurance authorization.  Follow Up Recommendations Skilled nursing-short term rehab (<3 hours/day) Can patient physically be transported by private vehicle: No    Assistance Recommended at Discharge Frequent or constant Supervision/Assistance  Patient can return home with the following  Two people to help with walking and/or transfers;Two people to help with bathing/dressing/bathroom;Assistance with cooking/housework;Direct supervision/assist for medications management;Direct supervision/assist for financial  management;Assist for transportation;Help with stairs or ramp for entrance    Equipment Recommendations Other (comment) (TBD)  Recommendations for Other Services       Functional Status Assessment Patient has had a recent decline in their functional status and/or demonstrates limited ability to make significant improvements in function in a reasonable and predictable amount of time     Precautions / Restrictions Precautions Precautions: Fall Restrictions Weight Bearing Restrictions: No Other Position/Activity Restrictions: Seizure precautions      Mobility  Bed Mobility Overal bed mobility: Needs Assistance Bed Mobility: Supine to Sit, Sit to Supine     Supine to sit: Mod assist Sit to supine: Mod assist, Min assist   General bed mobility comments: Min to mod A for BLE and trunk control with multi-modal cues for sequencing    Transfers Overall transfer level: Needs assistance Equipment used: Rolling walker (2 wheels) Transfers: Sit to/from Stand Sit to Stand: Max assist, +2 safety/equipment, From elevated surface           General transfer comment: Physical assist to place pt's hands on the RW, heavy cuing for sequencing, max A to come to standing    Ambulation/Gait               General Gait Details: Pt unable to advance either LE  Stairs            Wheelchair Mobility    Modified Rankin (Stroke Patients Only)       Balance Overall balance assessment: Needs assistance Sitting-balance support: Feet supported, Single extremity supported Sitting balance-Leahy Scale: Poor     Standing balance support: Bilateral upper extremity supported, During functional activity, Reliant on assistive device for balance Standing balance-Leahy Scale: Poor  Pertinent Vitals/Pain Pain Assessment Pain Assessment: PAINAD Breathing: normal Negative Vocalization: none Facial Expression: smiling or inexpressive Body  Language: relaxed Consolability: no need to console PAINAD Score: 0    Home Living Family/patient expects to be discharged to:: Unsure                   Additional Comments: Pt unable to provide history, no family present to assist    Prior Function Prior Level of Function : Patient poor historian/Family not available             Mobility Comments: Unknown       Hand Dominance        Extremity/Trunk Assessment   Upper Extremity Assessment Upper Extremity Assessment: Generalized weakness    Lower Extremity Assessment Lower Extremity Assessment: Generalized weakness       Communication      Cognition Arousal/Alertness: Lethargic Behavior During Therapy: Flat affect Overall Cognitive Status: No family/caregiver present to determine baseline cognitive functioning                                 General Comments: Pt able to follow some simple commands with extra time and cuing        General Comments      Exercises     Assessment/Plan    PT Assessment Patient needs continued PT services  PT Problem List Decreased strength;Decreased activity tolerance;Decreased balance;Decreased mobility;Decreased cognition;Decreased knowledge of use of DME;Decreased safety awareness       PT Treatment Interventions DME instruction;Gait training;Stair training;Functional mobility training;Therapeutic activities;Therapeutic exercise;Balance training;Patient/family education    PT Goals (Current goals can be found in the Care Plan section)  Acute Rehab PT Goals PT Goal Formulation: Patient unable to participate in goal setting Time For Goal Achievement: 03/09/22 Potential to Achieve Goals: Fair    Frequency Min 2X/week     Co-evaluation PT/OT/SLP Co-Evaluation/Treatment: Yes Reason for Co-Treatment: Necessary to address cognition/behavior during functional activity;For patient/therapist safety;To address functional/ADL transfers PT goals  addressed during session: Mobility/safety with mobility;Proper use of DME;Strengthening/ROM         AM-PAC PT "6 Clicks" Mobility  Outcome Measure Help needed turning from your back to your side while in a flat bed without using bedrails?: A Lot Help needed moving from lying on your back to sitting on the side of a flat bed without using bedrails?: A Lot Help needed moving to and from a bed to a chair (including a wheelchair)?: Total Help needed standing up from a chair using your arms (e.g., wheelchair or bedside chair)?: Total Help needed to walk in hospital room?: Total Help needed climbing 3-5 steps with a railing? : Total 6 Click Score: 8    End of Session Equipment Utilized During Treatment: Gait belt Activity Tolerance: Patient tolerated treatment well Patient left: in bed;with call bell/phone within reach;with nursing/sitter in room;Other (comment) (pt left with nursing with rails up and seizure precaution mats along bed rails in place) Nurse Communication: Mobility status PT Visit Diagnosis: Unsteadiness on feet (R26.81);Difficulty in walking, not elsewhere classified (R26.2);Muscle weakness (generalized) (M62.81)    Time: 4315-4008 PT Time Calculation (min) (ACUTE ONLY): 19 min   Charges:   PT Evaluation $PT Eval Moderate Complexity: 1 Mod         D. Scott Bram Hottel PT, DPT 02/24/22, 12:06 PM

## 2022-02-24 NOTE — Consult Note (Signed)
PHARMACY CONSULT NOTE - ELECTROLYTES  Pharmacy Consult for Electrolyte Monitoring and Replacement   Recent Labs: Potassium (mmol/L)  Date Value  02/24/2022 3.0 (L)   Magnesium (mg/dL)  Date Value  02/24/2022 1.7   Calcium (mg/dL)  Date Value  02/24/2022 8.7 (L)   Albumin (g/dL)  Date Value  02/24/2022 2.5 (L)   Phosphorus (mg/dL)  Date Value  02/24/2022 3.4   Sodium (mmol/L)  Date Value  02/24/2022 142   Corrected Ca: 9.9 mg/dL  Assessment  John Spears is a 72 y.o. male presenting with loss of consciousness. PMH significant for HTN, AUD. Pharmacy has been consulted to monitor and replace electrolytes.  Diet: clear liquid  Goal of Therapy: Electrolytes WNL  Plan:  Potassium: 3.2 >> 3.0, give KCl 10 mEq IV Q1H x8 Magnesium: 1.8 >> 1.7, no replacement needed Phosphorus: 4.1 >> 3.4, no replacement needed Check BMP, Mg, Phos with AM labs  Thank you for allowing pharmacy to be a part of this patient's care.  Gretel Acre, PharmD PGY1 Pharmacy Resident 02/24/2022 1:07 PM

## 2022-02-24 NOTE — Progress Notes (Signed)
Patient arrived to unit from ICU in bed with family at bedside.  Patient oriented to room and unit to increase cooperation.

## 2022-02-24 NOTE — Evaluation (Signed)
Clinical/Bedside Swallow Evaluation Patient Details  Name: John Spears MRN: [id number] Date of Birth: 1950/10/04  Today's Date: 02/24/2022 Time: SLP Start Time (ACUTE ONLY): 1430 SLP Stop Time (ACUTE ONLY): 1525 SLP Time Calculation (min) (ACUTE ONLY): 55 min  Past Medical History:  Past Medical History:  Diagnosis Date   Hypertension    Past Surgical History:  Past Surgical History:  Procedure Laterality Date   JOINT REPLACEMENT     HPI:  Pt is a 72 year old male with a past medical history significant for hypertension and alcohol abuse who presented to [facility] ED on 02/20/2022 after being found down and unresponsive by a friend at home. He had 3 seizures with EMS during which eyes were deviated to the right.  Patient was intubated and admitted into PCCM, neurology was consulted.  Patient has history of EtOH abuse, suspected withdrawal syndrome/delirium tremens.  Patient was extubated on 02/22/2022.  MRI: No acute intracranial abnormality.  2. Old left parietal lobe infarct and findings of chronic small  vessel disease.  CXR at admit: No acute abnormalities.    Assessment / Plan / Recommendation  Clinical Impression   Pt seen today for BSE. Pt awakened w/ stim and helped to move himself upright in bed, verbal to respond to basic questions; followed basic commands w/ cues. Mumbled speech at baseline; low volume. Mild Confusion in behaviors -- suspect impact from ETOH abuse. Sister present in room.  On RA: afebrile. WBC wnl.   Pt appears to present w/ grossly functional oropharyngeal phase swallowing w/ No immediate pharyngeal phase dysphagia noted, No overt neuromuscular deficits noted. Pt's oral phase was grossly [facility] for bolus management and timely swallow of liquids and purees -- pt has POOR/MISSING Dentition status at baseline which impacts mastication of solids. Pt consumed po trials w/ No immediate, overt clinical s/s of aspiration during po trials. Pt appears at reduced risk for  aspiration following general aspiration precautions, using a modified diet consistency for ease of oral phase mastication, and when given Supervision to lessen Impulsive drinking behaviors and provide cues during oral intake for follow through.  However, pt does have challenging factors that could impact oropharyngeal swallowing to include declined Cognitive functioning/ETOH abuse baseline, fatigue/weakness s/p acute illness, week self-feeding abilities, and poor/missing Dentition. Also suspect potential min Esophageal phase dysmotility in setting of belching w/ trials. These factors can increase risk for aspiration, dysphagia as well as decreased oral intake overall.   During po trials, pt consumed all consistencies w/ no immediate, overt coughing, decline in vocal quality, or change in respiratory presentation during/post trials. Oral phase appeared grossly [facility] w/ timely bolus management and control of bolus propulsion for A-P transfer for swallowing. Bolus mastication was impacted by Missing/Poor Dentition and required Time and moistening boluses cut Small. Oral clearing achieved w/ all trial consistencies.  OM Exam appeared [facility] w/ no unilateral weakness noted. Speech intelligibility reduced d/t muttered/mumbled speech and low volume despite cues.  Pt helped to feed self w/ setup support but needed cues/support d/t Impulsive drinking behaviors.      Recommend a more Minced diet consistency, Level 2 w/ well-chopped meats, moistened foods; Thin liquids -- carefully monitor straw use, and pt should help to Hold Cup when drinking. Pinch straw to limit Impulsive drinking - small sips. Recommend general aspiration precautions, Pills CRUSHED vs WHOLE in Puree for safer, easier swallowing. Feeding support at meals d/t Cognitive status.  Education given on Pills in Puree; food consistencies and easy to eat options; general aspiration precautions to  pt and Sister. NSG updated, agreed. MD updated. Recommend  Dietician f/u for support. ST services will f/u while admitted for ongoing assessment of swallowing, toleration of diet, and appropriateness to upgrade diet consistency. SLP Visit Diagnosis: Dysphagia, oral phase (R13.11) (poor Dentition status baseline; Cognitive decline; ETOH abuse)    Aspiration Risk  Mild aspiration risk;Risk for inadequate nutrition/hydration    Diet Recommendation   a more Minced diet consistency, Level 2 w/ well-chopped meats, moistened foods; Thin liquids -- carefully monitor straw use, and pt should help to Hold Cup when drinking. Pinch straw to limit Impulsive drinking - small sips. Recommend general aspiration precautions. Feeding support at meals d/t Cognitive status.   Medication Administration: Crushed with puree (vs Whole in puree)    Other  Recommendations Recommended Consults:  (Dietician f/u) Oral Care Recommendations: Oral care BID;Oral care before and after PO;Staff/trained caregiver to provide oral care Other Recommendations:  (n/a)    Recommendations for follow up therapy are one component of a multi-disciplinary discharge planning process, led by the attending physician.  Recommendations may be updated based on patient status, additional functional criteria and insurance authorization.  Follow up Recommendations Follow physician's recommendations for discharge plan and follow up therapies      Assistance Recommended at Discharge  Full Supervision at meals  Functional Status Assessment Patient has had a recent decline in their functional status and/or demonstrates limited ability to make significant improvements in function in a reasonable and predictable amount of time  Frequency and Duration min 2x/week  2 weeks       Prognosis Prognosis for Safe Diet Advancement: Fair Barriers to Reach Goals: Cognitive deficits;Time post onset;Severity of deficits;Behavior Barriers/Prognosis Comment: poor Dentition status baseline; Cognitive decline; ETOH abuse       Swallow Study   General Date of Onset: 02/20/22 HPI: Pt is a 72 year old male with a past medical history significant for hypertension and alcohol abuse who presented to [facility] ED on 02/20/2022 after being found down and unresponsive by a friend at home. He had 3 seizures with EMS during which eyes were deviated to the right.  Patient was intubated and admitted into PCCM, neurology was consulted.  Patient has history of EtOH abuse, suspected withdrawal syndrome/delirium tremens.  Patient was extubated on 02/22/2022.  MRI: No acute intracranial abnormality.  2. Old left parietal lobe infarct and findings of chronic small  vessel disease.  CXR at admit: No acute abnormalities. Type of Study: Bedside Swallow Evaluation Previous Swallow Assessment: none Diet Prior to this Study: Thin liquids (clear liquid diet) Temperature Spikes Noted: No (wbc 8.5) Respiratory Status: Room air History of Recent Intubation: No Behavior/Cognition: Alert;Cooperative;Pleasant mood;Distractible;Requires cueing Oral Cavity Assessment: Dry Oral Care Completed by SLP: Yes (sucked on swabs) Oral Cavity - Dentition: Poor condition;Missing dentition Vision: Functional for self-feeding Self-Feeding Abilities: Able to feed self;Needs assist;Needs set up;Total assist (weak UEs) Patient Positioning: Upright in bed (needed support positioning upright in bed) Baseline Vocal Quality: Low vocal intensity (muttered speech) Volitional Cough: Strong;Congested Volitional Swallow: Able to elicit    Oral/Motor/Sensory Function Overall Oral Motor/Sensory Function: Within functional limits (posterior BOT strong - could not assess gag reflex)   Ice Chips Ice chips: Within functional limits Presentation: Spoon (fed; 3 trials)   Thin Liquid Thin Liquid: Within functional limits Presentation: Cup;Self Fed;Straw (~8 ozs total) Other Comments: impulsive drinking behaviors requiring supervision to slow down    Nectar Thick Nectar Thick  Liquid: Not tested   Honey Thick Honey Thick Liquid: Not tested  Puree Puree: Within functional limits Presentation: Spoon;Self Fed (supported; 10 trials)   Solid     Solid: Impaired Presentation: Spoon (fed; 6 trials) Oral Phase Impairments: Impaired mastication (missing Dentition; poor status) Oral Phase Functional Implications: Impaired mastication Pharyngeal Phase Impairments:  (none)         [name], MS, CCC-SLP Speech Language Pathologist Rehab Services; [place] [phone] (ascom) Saintclair Schroader 02/24/2022,4:46 PM

## 2022-02-24 NOTE — Evaluation (Signed)
Occupational Therapy Evaluation Patient Details Name: John Spears MRN: [id number] DOB: Aug 29, 1950 Today's Date: 02/24/2022   History of Present Illness Pt is a 72 year old male with a past medical history significant for hypertension and alcohol abuse who presented to [facility] ED on 02/20/2022 after being found down and unresponsive.  MD assessment includes: aspiration pneumonia, mildly elevated troponin with suspected demand ischemia, lactic acidosis with recurrent seizures, and hyperglycemia.   Clinical Impression   John Spears was seen for OT/PT co-evaluation this date. Pt is poor historian, oriented to self only, follows 1 step commands intermittently. Pt presents to acute OT demonstrating impaired ADL performance and functional mobility 2/2 decreased activity tolerance and functional strength/ROM/balance deficits. Pt currently requires MAX A x2 sit<>stand at EOB. MAX A don B socks seated EOB, +2 assist for sitting balance. Not to have loose stools in standing, returned to bed for toileting. MAX A pericare bed level. Pt would benefit from skilled OT to address noted impairments and functional limitations (see below for any additional details). Upon hospital discharge, recommend STR to maximize pt safety and return to PLOF.   Recommendations for follow up therapy are one component of a multi-disciplinary discharge planning process, led by the attending physician.  Recommendations may be updated based on patient status, additional functional criteria and insurance authorization.   Follow Up Recommendations  Skilled nursing-short term rehab (<3 hours/day)     Assistance Recommended at Discharge Frequent or constant Supervision/Assistance  Patient can return home with the following Two people to help with walking and/or transfers;Two people to help with bathing/dressing/bathroom;Help with stairs or ramp for entrance    Functional Status Assessment  Patient has had a recent decline in their  functional status and demonstrates the ability to make significant improvements in function in a reasonable and predictable amount of time.  Equipment Recommendations  Other (comment) (defer)    Recommendations for Other Services       Precautions / Restrictions Precautions Precautions: Fall Precaution Comments: Seizure precautions Restrictions Weight Bearing Restrictions: No Other Position/Activity Restrictions: Seizure precautions      Mobility Bed Mobility Overal bed mobility: Needs Assistance Bed Mobility: Supine to Sit, Sit to Supine, Rolling Rolling: Mod assist   Supine to sit: Mod assist Sit to supine: Min assist   General bed mobility comments: limited by cognition    Transfers Overall transfer level: Needs assistance Equipment used: Rolling walker (2 wheels) Transfers: Sit to/from Stand Sit to Stand: Max assist, +2 safety/equipment, From elevated surface                  Balance Overall balance assessment: Needs assistance Sitting-balance support: Feet supported, Single extremity supported Sitting balance-Leahy Scale: Poor Sitting balance - Comments: intermittent CGA   Standing balance support: Bilateral upper extremity supported, During functional activity, Reliant on assistive device for balance Standing balance-Leahy Scale: Poor                             ADL either performed or assessed with clinical judgement   ADL Overall ADL's : Needs assistance/impaired                                       General ADL Comments: MAX A pericare bed level. MAX A don B socks seated EOB, +2 CGA for sitting balance      Pertinent Vitals/Pain Pain Assessment Pain  Assessment: PAINAD Breathing: normal Negative Vocalization: none Facial Expression: smiling or inexpressive Body Language: tense, distressed pacing, fidgeting Consolability: distracted or reassured by voice/touch PAINAD Score: 2     Hand Dominance      Extremity/Trunk Assessment Upper Extremity Assessment Upper Extremity Assessment: Generalized weakness   Lower Extremity Assessment Lower Extremity Assessment: Generalized weakness       Communication Communication Communication: Other (comment) (mumbles)   Cognition Arousal/Alertness: Lethargic Behavior During Therapy: Flat affect Overall Cognitive Status: No family/caregiver present to determine baseline cognitive functioning                                 General Comments: Pt able to follow some simple commands with extra time and cuing                Home Living Family/patient expects to be discharged to:: Unsure                                 Additional Comments: Pt unable to provide history, no family present to assist      Prior Functioning/Environment Prior Level of Function : Patient poor historian/Family not available             Mobility Comments: Unknown          OT Problem List: Decreased strength;Decreased range of motion;Decreased activity tolerance;Impaired balance (sitting and/or standing);Decreased safety awareness      OT Treatment/Interventions: Self-care/ADL training;Therapeutic exercise;Energy conservation;DME and/or AE instruction;Therapeutic activities;Patient/family education;Balance training    OT Goals(Current goals can be found in the care plan section) Acute Rehab OT Goals Patient Stated Goal: to be warm OT Goal Formulation: Patient unable to participate in goal setting Time For Goal Achievement: 03/10/22 Potential to Achieve Goals: Fair ADL Goals Pt Will Perform Grooming: sitting;with min guard assist Pt Will Perform Lower Body Dressing: with min assist;sitting/lateral leans Pt Will Transfer to Toilet: with mod assist;squat pivot transfer;bedside commode  OT Frequency: Min 2X/week    Co-evaluation PT/OT/SLP Co-Evaluation/Treatment: Yes Reason for Co-Treatment: Necessary to address  cognition/behavior during functional activity;For patient/therapist safety PT goals addressed during session: Mobility/safety with mobility OT goals addressed during session: ADL's and self-care      AM-PAC OT "6 Clicks" Daily Activity     Outcome Measure Help from another person eating meals?: A Little Help from another person taking care of personal grooming?: A Lot Help from another person toileting, which includes using toliet, bedpan, or urinal?: A Lot Help from another person bathing (including washing, rinsing, drying)?: A Lot Help from another person to put on and taking off regular upper body clothing?: A Little Help from another person to put on and taking off regular lower body clothing?: A Lot 6 Click Score: 14   End of Session Nurse Communication: Mobility status  Activity Tolerance: Patient tolerated treatment well Patient left: in bed;with call bell/phone within reach;with nursing/sitter in room  OT Visit Diagnosis: Unsteadiness on feet (R26.81);Muscle weakness (generalized) (M62.81)                Time: 0623-7628 OT Time Calculation (min): 20 min Charges:  OT General Charges $OT Visit: 1 Visit OT Evaluation $OT Eval Moderate Complexity: 1 Mod OT Treatments $Self Care/Home Management : 8-22 mins  Dessie Coma, M.S. OTR/L  02/24/22, 12:47 PM  ascom [phone]

## 2022-02-24 NOTE — Progress Notes (Signed)
  Echocardiogram 2D Echocardiogram has been performed.  John Spears 02/24/2022, 9:40 AM

## 2022-02-24 NOTE — Progress Notes (Addendum)
Subjective: Patient extubated. No further seizures.  Objective: Current vital signs: BP (!) 159/103 (BP Location: Left Arm)   Pulse 96   Temp 99 F (37.2 C) (Tympanic)   Resp (!) 26   Ht 5\' 11"  (1.803 m)   Wt 78.9 kg Comment: previous weight recorded, bed screen not working  SpO2 97%   BMI 24.26 kg/m  Vital signs in last 24 hours: Temp:  [97.8 F (36.6 C)-101.5 F (38.6 C)] 99 F (37.2 C) (01/02 0406) Pulse Rate:  [41-112] 96 (01/02 0406) Resp:  [12-26] 26 (01/02 0406) BP: (137-164)/(66-115) 159/103 (01/02 0406) SpO2:  [95 %-100 %] 97 % (01/02 0406) Weight:  [78.9 kg] 78.9 kg (01/02 0410)  Intake/Output from previous day: 01/01 0701 - 01/02 0700 In: 1570.5 [P.O.:600; IV Piggyback:970.5] Out: 1775 [Urine:1775] Intake/Output this shift: Total I/O In: 300 [IV Piggyback:300] Out: 900 [Urine:900] Nutritional status:  Diet Order             Diet clear liquid Room service appropriate? Yes; Fluid consistency: Thin  Diet effective now                    Physical Exam Gen: alert, oriented to self and hospital HEENT: Atraumatic, normocephalic; oropharynx clear, tongue without atrophy or fasciculations. Resp: CTAB, normal work of breathing CV: RRR, extremities appear well-perfused. Abd: soft/NT/ND Extrem: Nml bulk; no cyanosis, clubbing, or edema.  Neuro: *MS: alert, oriented to self and hospital, follows simple commands *Speech: mild dysarthria *CN: PERRL, blinks to threat bilat, EOMI, face symmetric, hearing intact to voice *Motor: anti-gravity BUE>BLE *Sensory: SILT   Lab Results: Results for orders placed or performed during the hospital encounter of 02/20/22 (from the past 48 hour(s))  Glucose, capillary     Status: None   Collection Time: 02/22/22  7:33 AM  Result Value Ref Range   Glucose-Capillary 79 70 - 99 mg/dL    Comment: Glucose reference range applies only to samples taken after fasting for at least 8 hours.  Glucose, capillary     Status: None    Collection Time: 02/22/22 12:09 PM  Result Value Ref Range   Glucose-Capillary 71 70 - 99 mg/dL    Comment: Glucose reference range applies only to samples taken after fasting for at least 8 hours.  Glucose, capillary     Status: None   Collection Time: 02/22/22  3:57 PM  Result Value Ref Range   Glucose-Capillary 85 70 - 99 mg/dL    Comment: Glucose reference range applies only to samples taken after fasting for at least 8 hours.  Glucose, capillary     Status: None   Collection Time: 02/22/22  7:51 PM  Result Value Ref Range   Glucose-Capillary 94 70 - 99 mg/dL    Comment: Glucose reference range applies only to samples taken after fasting for at least 8 hours.  Glucose, capillary     Status: None   Collection Time: 02/22/22 11:19 PM  Result Value Ref Range   Glucose-Capillary 91 70 - 99 mg/dL    Comment: Glucose reference range applies only to samples taken after fasting for at least 8 hours.  Glucose, capillary     Status: None   Collection Time: 02/23/22  4:01 AM  Result Value Ref Range   Glucose-Capillary 87 70 - 99 mg/dL    Comment: Glucose reference range applies only to samples taken after fasting for at least 8 hours.  CBC     Status: Abnormal   Collection Time: 02/23/22  4:31 AM  Result Value Ref Range   WBC 6.6 4.0 - 10.5 K/uL   RBC 2.87 (L) 4.22 - 5.81 MIL/uL   Hemoglobin 9.7 (L) 13.0 - 17.0 g/dL   HCT 28.7 (L) 39.0 - 52.0 %   MCV 100.0 80.0 - 100.0 fL   MCH 33.8 26.0 - 34.0 pg   MCHC 33.8 30.0 - 36.0 g/dL   RDW 19.0 (H) 11.5 - 15.5 %   Platelets 149 (L) 150 - 400 K/uL   nRBC 0.0 0.0 - 0.2 %    Comment: Performed at [facility], [address], [place], [place] [address]  Renal function panel     Status: Abnormal   Collection Time: 02/23/22  4:31 AM  Result Value Ref Range   Sodium 142 135 - 145 mmol/L   Potassium 3.2 (L) 3.5 - 5.1 mmol/L   Chloride 110 98 - 111 mmol/L   CO2 22 22 - 32 mmol/L   Glucose, Bld 95 70 - 99 mg/dL    Comment: Glucose  reference range applies only to samples taken after fasting for at least 8 hours.   BUN 11 8 - 23 mg/dL   Creatinine, Ser 0.93 0.61 - 1.24 mg/dL   Calcium 8.7 (L) 8.9 - 10.3 mg/dL   Phosphorus 4.1 2.5 - 4.6 mg/dL   Albumin 2.6 (L) 3.5 - 5.0 g/dL   GFR, Estimated >60 >60 mL/min    Comment: (NOTE) Calculated using the CKD-EPI Creatinine Equation (2021)    Anion gap 10 5 - 15    Comment: Performed at [facility], [address], [place], [place] [address]  Magnesium     Status: None   Collection Time: 02/23/22  4:31 AM  Result Value Ref Range   Magnesium 1.8 1.7 - 2.4 mg/dL    Comment: Performed at [facility], [address] [place], [place] [address]  Glucose, capillary     Status: None   Collection Time: 02/23/22  7:31 AM  Result Value Ref Range   Glucose-Capillary 98 70 - 99 mg/dL    Comment: Glucose reference range applies only to samples taken after fasting for at least 8 hours.  Glucose, capillary     Status: None   Collection Time: 02/23/22 11:46 AM  Result Value Ref Range   Glucose-Capillary 90 70 - 99 mg/dL    Comment: Glucose reference range applies only to samples taken after fasting for at least 8 hours.  Brain natriuretic peptide     Status: Abnormal   Collection Time: 02/23/22  2:41 PM  Result Value Ref Range   B Natriuretic Peptide 1,539.0 (H) 0.0 - 100.0 pg/mL    Comment: Performed at [facility], [address] [place], [place] [address]  Glucose, capillary     Status: None   Collection Time: 02/23/22  3:18 PM  Result Value Ref Range   Glucose-Capillary 97 70 - 99 mg/dL    Comment: Glucose reference range applies only to samples taken after fasting for at least 8 hours.  Glucose, capillary     Status: Abnormal   Collection Time: 02/23/22  7:24 PM  Result Value Ref Range   Glucose-Capillary 106 (H) 70 - 99 mg/dL    Comment: Glucose reference range applies only to samples taken after fasting for at least 8 hours.  Glucose,  capillary     Status: Abnormal   Collection Time: 02/23/22 11:27 PM  Result Value Ref Range   Glucose-Capillary 100 (H) 70 -  99 mg/dL    Comment: Glucose reference range applies only to samples taken after fasting for at least 8 hours.  Glucose, capillary     Status: None   Collection Time: 02/24/22  3:27 AM  Result Value Ref Range   Glucose-Capillary 87 70 - 99 mg/dL    Comment: Glucose reference range applies only to samples taken after fasting for at least 8 hours.  CBC     Status: Abnormal   Collection Time: 02/24/22  4:26 AM  Result Value Ref Range   WBC 8.5 4.0 - 10.5 K/uL   RBC 2.71 (L) 4.22 - 5.81 MIL/uL   Hemoglobin 9.0 (L) 13.0 - 17.0 g/dL   HCT 27.5 (L) 39.0 - 52.0 %   MCV 101.5 (H) 80.0 - 100.0 fL   MCH 33.2 26.0 - 34.0 pg   MCHC 32.7 30.0 - 36.0 g/dL   RDW 18.2 (H) 11.5 - 15.5 %   Platelets 136 (L) 150 - 400 K/uL   nRBC 0.2 0.0 - 0.2 %    Comment: Performed at [facility], [address], [place], [place] [address]  Renal function panel     Status: Abnormal   Collection Time: 02/24/22  4:26 AM  Result Value Ref Range   Sodium 142 135 - 145 mmol/L   Potassium 3.0 (L) 3.5 - 5.1 mmol/L   Chloride 104 98 - 111 mmol/L   CO2 24 22 - 32 mmol/L   Glucose, Bld 95 70 - 99 mg/dL    Comment: Glucose reference range applies only to samples taken after fasting for at least 8 hours.   BUN 13 8 - 23 mg/dL   Creatinine, Ser 1.09 0.61 - 1.24 mg/dL   Calcium 8.7 (L) 8.9 - 10.3 mg/dL   Phosphorus 3.4 2.5 - 4.6 mg/dL   Albumin 2.5 (L) 3.5 - 5.0 g/dL   GFR, Estimated >60 >60 mL/min    Comment: (NOTE) Calculated using the CKD-EPI Creatinine Equation (2021)    Anion gap 14 5 - 15    Comment: Performed at [facility], [address] [place], [place] [address]    Recent Results (from the past 240 hour(s))  MRSA Next Gen by PCR, Nasal     Status: None   Collection Time: 02/20/22  7:23 PM   Specimen: Nasal Mucosa; Nasal Swab  Result Value Ref Range Status    MRSA by PCR Next Gen NOT DETECTED NOT DETECTED Final    Comment: (NOTE) The GeneXpert MRSA Assay (FDA approved for NASAL specimens only), is one component of a comprehensive MRSA colonization surveillance program. It is not intended to diagnose MRSA infection nor to guide or monitor treatment for MRSA infections. Test performance is not FDA approved in patients less than 64 years old. Performed at [facility], [address] [place], [place] [address]   Culture, Respiratory w Gram Stain     Status: None   Collection Time: 02/21/22  5:30 AM   Specimen: Tracheal Aspirate; Respiratory  Result Value Ref Range Status   Specimen Description   Final    TRACHEAL ASPIRATE Performed at [facility], [address] [place], [place] [address]    Special Requests   Final    NONE Performed at [facility], [place], [place] [address]    Gram Stain   Final    ABUNDANT GRAM POSITIVE COCCI IN PAIRS IN CHAINS FEW GRAM NEGATIVE COCCOBACILLI FEW WBC PRESENT, PREDOMINANTLY PMN  Culture   Final    FEW Normal respiratory flora-no Staph aureus or Pseudomonas seen Performed at [facility] [address], [place], [place] [address]    Report Status 02/23/2022 FINAL  Final  Respiratory (~20 pathogens) panel by PCR     Status: None   Collection Time: 02/21/22  8:30 AM   Specimen: Nasopharyngeal Swab; Respiratory  Result Value Ref Range Status   Adenovirus NOT DETECTED NOT DETECTED Final   Coronavirus 229E NOT DETECTED NOT DETECTED Final    Comment: (NOTE) The Coronavirus on the Respiratory Panel, DOES NOT test for the novel  Coronavirus (2019 nCoV)    Coronavirus HKU1 NOT DETECTED NOT DETECTED Final   Coronavirus NL63 NOT DETECTED NOT DETECTED Final   Coronavirus OC43 NOT DETECTED NOT DETECTED Final   Metapneumovirus NOT DETECTED NOT DETECTED Final   Rhinovirus / Enterovirus NOT DETECTED NOT DETECTED Final   Influenza A NOT DETECTED NOT  DETECTED Final   Influenza B NOT DETECTED NOT DETECTED Final   Parainfluenza Virus 1 NOT DETECTED NOT DETECTED Final   Parainfluenza Virus 2 NOT DETECTED NOT DETECTED Final   Parainfluenza Virus 3 NOT DETECTED NOT DETECTED Final   Parainfluenza Virus 4 NOT DETECTED NOT DETECTED Final   Respiratory Syncytial Virus NOT DETECTED NOT DETECTED Final   Bordetella pertussis NOT DETECTED NOT DETECTED Final   Bordetella Parapertussis NOT DETECTED NOT DETECTED Final   Chlamydophila pneumoniae NOT DETECTED NOT DETECTED Final   Mycoplasma pneumoniae NOT DETECTED NOT DETECTED Final    Comment: Performed at [facility], [place], [place], [place] [address]  Resp panel by RT-PCR (RSV, Flu A&B, Covid) Anterior Nasal Swab     Status: None   Collection Time: 02/21/22  8:30 AM   Specimen: Anterior Nasal Swab  Result Value Ref Range Status   SARS Coronavirus 2 by RT PCR NEGATIVE NEGATIVE Final    Comment: (NOTE) SARS-CoV-2 target nucleic acids are NOT DETECTED.  The SARS-CoV-2 RNA is generally detectable in upper respiratory specimens during the acute phase of infection. The lowest concentration of SARS-CoV-2 viral copies this assay can detect is 138 copies/mL. A negative result does not preclude SARS-Cov-2 infection and should not be used as the sole basis for treatment or other patient management decisions. A negative result may occur with  improper specimen collection/handling, submission of specimen other than nasopharyngeal swab, presence of viral mutation(s) within the areas targeted by this assay, and inadequate number of viral copies(<138 copies/mL). A negative result must be combined with clinical observations, patient history, and epidemiological information. The expected result is Negative.  Fact Sheet for Patients:  EntrepreneurPulse.com.au  Fact Sheet for Healthcare Providers:  IncredibleEmployment.be  This test is no t yet approved or  cleared by the [place] FDA and  has been authorized for detection and/or diagnosis of SARS-CoV-2 by FDA under an Emergency Use Authorization (EUA). This EUA will remain  in effect (meaning this test can be used) for the duration of the COVID-19 declaration under Section 564(b)(1) of the Act, 21 U.S.C.section 360bbb-3(b)(1), unless the authorization is terminated  or revoked sooner.       Influenza A by PCR NEGATIVE NEGATIVE Final   Influenza B by PCR NEGATIVE NEGATIVE Final    Comment: (NOTE) The Xpert Xpress SARS-CoV-2/FLU/RSV plus assay is intended as an aid in the diagnosis of influenza from Nasopharyngeal swab specimens and should not be used as a sole basis for treatment. Nasal washings and aspirates are unacceptable for  Xpert Xpress SARS-CoV-2/FLU/RSV testing.  Fact Sheet for Patients: EntrepreneurPulse.com.au  Fact Sheet for Healthcare Providers: IncredibleEmployment.be  This test is not yet approved or cleared by the [place] FDA and has been authorized for detection and/or diagnosis of SARS-CoV-2 by FDA under an Emergency Use Authorization (EUA). This EUA will remain in effect (meaning this test can be used) for the duration of the COVID-19 declaration under Section 564(b)(1) of the Act, 21 U.S.C. section 360bbb-3(b)(1), unless the authorization is terminated or revoked.     Resp Syncytial Virus by PCR NEGATIVE NEGATIVE Final    Comment: (NOTE) Fact Sheet for Patients: EntrepreneurPulse.com.au  Fact Sheet for Healthcare Providers: IncredibleEmployment.be  This test is not yet approved or cleared by the [place] FDA and has been authorized for detection and/or diagnosis of SARS-CoV-2 by FDA under an Emergency Use Authorization (EUA). This EUA will remain in effect (meaning this test can be used) for the duration of the COVID-19 declaration under Section 564(b)(1) of the Act, 21  U.S.C. section 360bbb-3(b)(1), unless the authorization is terminated or revoked.  Performed at [facility], [address] [place], [place] [address]     Lipid Panel No results for input(s): "CHOL", "TRIG", "HDL", "CHOLHDL", "VLDL", "LDLCALC" in the last 72 hours.  Studies/Results: DG Chest Port 1 View  Result Date: 02/23/2022 CLINICAL DATA:  Aspiration. EXAM: PORTABLE CHEST 1 VIEW COMPARISON:  02/21/2022 FINDINGS: This is a low volume study. The cardiomediastinal silhouette is unchanged. Endotracheal tube and NG tube have been removed. New LEFT LOWER lung opacity is noted. No pneumothorax or large pleural effusion noted. No acute bony abnormalities are present. IMPRESSION: New LEFT LOWER lung opacity which may represent aspiration, pneumonia and/or atelectasis. Electronically Signed   By: Margarette Canada M.D.   On: 02/23/2022 15:04   DG Ankle Left Port  Result Date: 02/23/2022 CLINICAL DATA:  72 year old male with history of edema in the right ankle. EXAM: PORTABLE LEFT ANKLE - 2 VIEW COMPARISON:  No priors. FINDINGS: Two views of the right ankle demonstrate no acute displaced fracture, subluxation or dislocation. No destructive osseous lesions. Soft tissues are minimally swollen. IMPRESSION: 1. No acute osseous abnormality of the right ankle. Electronically Signed   By: Vinnie Langton M.D.   On: 02/23/2022 12:19    Medications: Scheduled:  Chlorhexidine Gluconate Cloth  6 each Topical Daily   docusate  100 mg Per Tube BID   folic acid  1 mg Oral Daily   heparin  5,000 Units Subcutaneous Q8H   insulin aspart  0-15 Units Subcutaneous Q4H   LORazepam  1 mg Intravenous Once   metoprolol tartrate  2.5 mg Intravenous Once   multivitamin with minerals  1 tablet Oral Daily   pantoprazole (PROTONIX) IV  40 mg Intravenous QHS   polyethylene glycol  17 g Per Tube Daily   thiamine (VITAMIN B1) injection  100 mg Intravenous Q24H   Continuous:  ampicillin-sulbactam (UNASYN) IV Stopped  (02/24/22 0319)   levETIRAcetam Stopped (02/23/22 2050)    Assessment: 72 year old male with a history of HTN and heavy EtOH use who was brought in by EMS after being found down and unresponsive at home by a friend. He had 3 seizures with EMS during which eyes were deviated to the right. When not actively seizing his eyes were deviated to the left. EMS administered Versed en route. BP 212/124. Patient was postictal with eyes deviated to the left on arrival to the ED. ED staff then witnessed another seizure, with  arms flexed and shaking, eyes deviated to the right. On exam by EDP after the last seizure, he would moan occasionally but did not follow commands. Gag reflex was absent. He was intubated for airway protection and received 7.5 mg Versed IV during intubation. After intubation, he was started on Versed drip at 5 mg/hr. EtOH level < 10 at presentation - CT head reveals a medium to large sized wedge-shaped hypodensity in the left parietal lobe. There are no signs of bleeding within the cranium. MRI brain confirmed this to be chronic, no e/o acute infarcts. - Cer[facility]ell EEG report 02/21/2022 2151 to 02/22/2022 0643: Continuous slow, generalized. This limited ceribell study is suggestive of moderate to severe diffuse encephalopathy, nonspecific etiology but could be secondary to sedation. No seizures or epileptiform discharges were seen throughout the recording. - [facility] EEG report 02/20/2022 2053 to 02/21/2022 0800: Continuous generalized slowing. This limited ceribell study is suggestive of moderate to severe diffuse encephalopathy, nonspecific to etiology but could be secondary to sedation. No seizures or epileptiform discharges were seen throughout the recording. - UDS was positive for cannabinoid and benzodiazepine - Overall impression: Complex partial status epilepticus, now resolved. Seizure focus most likely in the left cerebral hemisphere. Most likely precipitated by EtOH withdrawal      Recommendations: - Continue scheduled Keppra dosing at 1000 mg IV BID.  - CIWA protocol/EtOH withdrawal precautions - Inpatient seizure precautions  Su Monks, MD [facility] [phone]  If 7pm- 7am, please page neurology on call as listed in [place].

## 2022-02-25 DIAGNOSIS — R569 Unspecified convulsions: Secondary | ICD-10-CM | POA: Diagnosis not present

## 2022-02-25 DIAGNOSIS — R7989 Other specified abnormal findings of blood chemistry: Secondary | ICD-10-CM

## 2022-02-25 DIAGNOSIS — I5021 Acute systolic (congestive) heart failure: Secondary | ICD-10-CM | POA: Diagnosis not present

## 2022-02-25 LAB — BASIC METABOLIC PANEL WITH GFR
Anion gap: 8 (ref 5–15)
BUN: 17 mg/dL (ref 8–23)
CO2: 24 mmol/L (ref 22–32)
Calcium: 8.5 mg/dL — ABNORMAL LOW (ref 8.9–10.3)
Chloride: 107 mmol/L (ref 98–111)
Creatinine, Ser: 1.03 mg/dL (ref 0.61–1.24)
GFR, Estimated: >60 mL/min
Glucose, Bld: 118 mg/dL — ABNORMAL HIGH (ref 70–99)
Potassium: 2.9 mmol/L — ABNORMAL LOW (ref 3.5–5.1)
Sodium: 139 mmol/L (ref 135–145)

## 2022-02-25 LAB — CBC
HCT: 24.6 % — ABNORMAL LOW (ref 39.0–52.0)
Hemoglobin: 8.2 g/dL — ABNORMAL LOW (ref 13.0–17.0)
MCH: 33.7 pg (ref 26.0–34.0)
MCHC: 33.3 g/dL (ref 30.0–36.0)
MCV: 101.2 fL — ABNORMAL HIGH (ref 80.0–100.0)
Platelets: 129 10*3/uL — ABNORMAL LOW (ref 150–400)
RBC: 2.43 MIL/uL — ABNORMAL LOW (ref 4.22–5.81)
RDW: 17.6 % — ABNORMAL HIGH (ref 11.5–15.5)
WBC: 7.8 10*3/uL (ref 4.0–10.5)
nRBC: 0 % (ref 0.0–0.2)

## 2022-02-25 LAB — PHOSPHORUS: Phosphorus: 2.7 mg/dL (ref 2.5–4.6)

## 2022-02-25 LAB — GLUCOSE, CAPILLARY
Glucose-Capillary: 106 mg/dL — ABNORMAL HIGH (ref 70–99)
Glucose-Capillary: 109 mg/dL — ABNORMAL HIGH (ref 70–99)
Glucose-Capillary: 112 mg/dL — ABNORMAL HIGH (ref 70–99)
Glucose-Capillary: 68 mg/dL — ABNORMAL LOW (ref 70–99)
Glucose-Capillary: 90 mg/dL (ref 70–99)
Glucose-Capillary: 97 mg/dL (ref 70–99)
Glucose-Capillary: 99 mg/dL (ref 70–99)

## 2022-02-25 LAB — MAGNESIUM: Magnesium: 1.7 mg/dL (ref 1.7–2.4)

## 2022-02-25 MED ORDER — POTASSIUM CHLORIDE 10 MEQ/100ML IV SOLN: 10.0000 meq | INTRAVENOUS | Status: DC

## 2022-02-25 MED ORDER — BISACODYL 10 MG RE SUPP: 10.0000 mg | Freq: Every day | RECTAL | Status: DC | PRN

## 2022-02-25 MED ORDER — BISACODYL 5 MG PO TBEC: 10.0000 mg | DELAYED_RELEASE_TABLET | Freq: Every day | ORAL | Status: DC | PRN

## 2022-02-25 MED ORDER — POTASSIUM CHLORIDE 10 MEQ/100ML IV SOLN
10.0000 meq | INTRAVENOUS | Status: DC
  Administered 2022-02-25: 10 meq via INTRAVENOUS
  Filled 2022-02-25: qty 100

## 2022-02-25 MED ORDER — MAGNESIUM SULFATE 2 GM/50ML IV SOLN
2.0000 g | Freq: Once | INTRAVENOUS | Status: AC
  Administered 2022-02-25: 2 g via INTRAVENOUS
  Filled 2022-02-25: qty 50

## 2022-02-25 MED ORDER — POTASSIUM CHLORIDE 10 MEQ/100ML IV SOLN
10.0000 meq | INTRAVENOUS | Status: DC
  Administered 2022-02-25: 10 meq via INTRAVENOUS

## 2022-02-25 MED ORDER — VITAMIN D (ERGOCALCIFEROL) 1.25 MG (50000 UNIT) PO CAPS
50000.0000 [IU] | ORAL_CAPSULE | ORAL | Status: DC
  Administered 2022-02-25: 50000 [IU] via ORAL
  Filled 2022-02-25: qty 1

## 2022-02-25 MED ORDER — LOSARTAN POTASSIUM 25 MG PO TABS
25.0000 mg | ORAL_TABLET | Freq: Every day | ORAL | Status: DC
  Administered 2022-02-25: 25 mg via ORAL
  Filled 2022-02-25: qty 1

## 2022-02-25 MED ORDER — AMOXICILLIN-POT CLAVULANATE 875-125 MG PO TABS
1.0000 | ORAL_TABLET | Freq: Two times a day (BID) | ORAL | Status: DC
  Administered 2022-02-25 – 2022-03-01 (×8): 1 via ORAL
  Filled 2022-02-25 (×8): qty 1

## 2022-02-25 MED ORDER — POTASSIUM CHLORIDE 10 MEQ/100ML IV SOLN
10.0000 meq | INTRAVENOUS | Status: AC
  Filled 2022-02-25: qty 100

## 2022-02-25 MED ORDER — POLYETHYLENE GLYCOL 3350 17 G PO PACK: 17.0000 g | PACK | Freq: Every day | ORAL | Status: DC | PRN

## 2022-02-25 MED ORDER — POTASSIUM CHLORIDE CRYS ER 20 MEQ PO TBCR
40.0000 meq | EXTENDED_RELEASE_TABLET | ORAL | Status: AC
  Administered 2022-02-25 (×2): 40 meq via ORAL
  Filled 2022-02-25 (×2): qty 2

## 2022-02-25 MED ORDER — FAMOTIDINE 20 MG PO TABS
20.0000 mg | ORAL_TABLET | Freq: Every day | ORAL | Status: DC
  Administered 2022-02-25 – 2022-03-02 (×6): 20 mg via ORAL
  Filled 2022-02-25 (×6): qty 1

## 2022-02-25 MED ORDER — LEVETIRACETAM 500 MG PO TABS
1000.0000 mg | ORAL_TABLET | Freq: Two times a day (BID) | ORAL | Status: DC
  Administered 2022-02-25 – 2022-03-03 (×12): 1000 mg via ORAL
  Filled 2022-02-25 (×12): qty 2

## 2022-02-25 MED ORDER — CARVEDILOL 3.125 MG PO TABS
3.1250 mg | ORAL_TABLET | Freq: Two times a day (BID) | ORAL | Status: DC
  Administered 2022-02-25: 3.125 mg via ORAL
  Filled 2022-02-25 (×2): qty 1

## 2022-02-25 NOTE — Progress Notes (Signed)
Patient found with RUE PIV discontinued, lying in lap.  Dr Dwyane Dee notified with IV antibiotics still required.  Dr Dwyane Dee discontinued IV antibiotics and initiated antibiotics by mouth.  Dr Dwyane Dee denies need to restart PIV.

## 2022-02-25 NOTE — Progress Notes (Signed)
Physical Therapy Treatment Patient Details Name: John Spears MRN: [id number] DOB: 06-25-1950 Today's Date: 02/25/2022   History of Present Illness Pt is a 71 year old male with a past medical history significant for hypertension and alcohol abuse who presented to [facility] ED on 02/20/2022 after being found down and unresponsive.  MD assessment includes: aspiration pneumonia, mildly elevated troponin with suspected demand ischemia, lactic acidosis with recurrent seizures, and hyperglycemia.    PT Comments    Pt with flat affect but grossly more alert and verbally communicative compared to prior session.  Pt made notable progress towards goals this session needing less physical assist with functional tasks and was able to take several small, shuffling steps at the EOB before fatiguing and needing to return to sitting.  Pt reported no adverse symptoms during the session with SpO2 and HR WNL.  Pt will benefit from PT services in a SNF setting upon discharge to safely address deficits listed in patient problem list for decreased caregiver assistance and eventual return to PLOF.   Recommendations for follow up therapy are one component of a multi-disciplinary discharge planning process, led by the attending physician.  Recommendations may be updated based on patient status, additional functional criteria and insurance authorization.  Follow Up Recommendations  Skilled nursing-short term rehab (<3 hours/day) Can patient physically be transported by private vehicle: No   Assistance Recommended at Discharge Frequent or constant Supervision/Assistance  Patient can return home with the following Assistance with cooking/housework;Direct supervision/assist for medications management;Direct supervision/assist for financial management;Assist for transportation;Help with stairs or ramp for entrance;A lot of help with walking and/or transfers;A lot of help with bathing/dressing/bathroom   Equipment  Recommendations  Other (comment) (TBD)    Recommendations for Other Services       Precautions / Restrictions Precautions Precautions: Fall Restrictions Weight Bearing Restrictions: No Other Position/Activity Restrictions: Seizure precautions     Mobility  Bed Mobility Overal bed mobility: Needs Assistance       Supine to sit: Supervision Sit to supine: Supervision   General bed mobility comments: Mod verbal cues for sequencing and use of bed rails but no physical assist needed    Transfers Overall transfer level: Needs assistance Equipment used: Rolling walker (2 wheels) Transfers: Sit to/from Stand Sit to Stand: Min guard, From elevated surface           General transfer comment: Verbal cues for sequencing and significant time and effort needed to come to standing but no physical assist required    Ambulation/Gait Ambulation/Gait assistance: Min assist Gait Distance (Feet): 2 Feet Assistive device: Rolling walker (2 wheels) Gait Pattern/deviations: Step-to pattern, Shuffle, Trunk flexed Gait velocity: decreased     General Gait Details: Pt able to take several small, shuffling steps with min A to guide the RW and cues for sequencing   Stairs             Wheelchair Mobility    Modified Rankin (Stroke Patients Only)       Balance Overall balance assessment: Needs assistance Sitting-balance support: Feet supported, Single extremity supported Sitting balance-Leahy Scale: Good     Standing balance support: Bilateral upper extremity supported, During functional activity, Reliant on assistive device for balance Standing balance-Leahy Scale: Poor                              Cognition Arousal/Alertness: Lethargic Behavior During Therapy: Flat affect Overall Cognitive Status: Within Functional Limits for tasks assessed  Exercises Total Joint Exercises Ankle Circles/Pumps:  Strengthening, Both, 10 reps (with manual resistance) Quad Sets: Strengthening, Both, 10 reps Heel Slides: AAROM, Strengthening, Both, 10 reps Hip ABduction/ADduction: AAROM, Strengthening, Both, 10 reps Straight Leg Raises: AAROM, Strengthening, Both, 10 reps    General Comments        Pertinent Vitals/Pain Pain Assessment Pain Assessment: No/denies pain    Home Living                          Prior Function            PT Goals (current goals can now be found in the care plan section) Progress towards PT goals: Progressing toward goals    Frequency    Min 2X/week      PT Plan Current plan remains appropriate    Co-evaluation              AM-PAC PT "6 Clicks" Mobility   Outcome Measure  Help needed turning from your back to your side while in a flat bed without using bedrails?: A Lot Help needed moving from lying on your back to sitting on the side of a flat bed without using bedrails?: A Lot Help needed moving to and from a bed to a chair (including a wheelchair)?: A Lot Help needed standing up from a chair using your arms (e.g., wheelchair or bedside chair)?: A Lot Help needed to walk in hospital room?: Total Help needed climbing 3-5 steps with a railing? : Total 6 Click Score: 10    End of Session Equipment Utilized During Treatment: Gait belt Activity Tolerance: Patient tolerated treatment well Patient left: in bed;with call bell/phone within reach;with family/visitor present;with bed alarm set Nurse Communication: Mobility status PT Visit Diagnosis: Unsteadiness on feet (R26.81);Difficulty in walking, not elsewhere classified (R26.2);Muscle weakness (generalized) (M62.81)     Time: 5784-6962 PT Time Calculation (min) (ACUTE ONLY): 23 min  Charges:  $Therapeutic Exercise: 8-22 mins $Therapeutic Activity: 8-22 mins                     D. Scott Anquan Azzarello PT, DPT 02/25/22, 4:50 PM

## 2022-02-25 NOTE — Progress Notes (Signed)
Patient is alert and oriented x2. Turned frequently. Patient is a total care. Q4h CIWAs were 0 throughout the night. Denied pain. Blood glucose dropped to 60s last night but was increased with carbs to 106 with the last reading around 0400/0500 being 112. Patient accidentally pulled R-AC IV out with movement. Had a complete bed change x1 and a bed pad change x2. Had 2 watery diarrheal stools over the course of the shift. Has not urinated as of yet. Tech April bladder scanned patient and stated that there was only 36 mls in bladder according to scanner.

## 2022-02-25 NOTE — Consult Note (Signed)
PHARMACY CONSULT NOTE - ELECTROLYTES  Pharmacy Consult for Electrolyte Monitoring and Replacement   Recent Labs: Potassium (mmol/L)  Date Value  02/25/2022 2.9 (L)   Magnesium (mg/dL)  Date Value  02/25/2022 1.7   Calcium (mg/dL)  Date Value  02/25/2022 8.5 (L)   Albumin (g/dL)  Date Value  02/24/2022 2.5 (L)   Phosphorus (mg/dL)  Date Value  02/25/2022 2.7   Sodium (mmol/L)  Date Value  02/25/2022 139   Corrected Ca: 9.9 mg/dL  Assessment  John Spears is a 72 y.o. male presenting with loss of consciousness. PMH significant for HTN, AUD. Pharmacy has been consulted to monitor and replace electrolytes.  Diet: clear liquid  Goal of Therapy: Electrolytes WNL  Plan:  Potassium: 2.9, KCl 10 mEq IV Q1H x 7 Magnesium: 1.7, Mag sulfate 2g IV x 1 Phosphorus: 2.7, no replacement needed Check BMP, Mg, Phos with AM labs  Thank you for allowing pharmacy to be a part of this patient's care.  Pearla Dubonnet, PharmD Clinical Pharmacist 02/25/2022 10:32 AM

## 2022-02-25 NOTE — Consult Note (Signed)
Cardiology Consultation   Patient ID: Reo Portela MRN: [id number]; DOB: 05/26/1950  Admit date: 02/20/2022 Date of Consult: 02/25/2022  PCP:  [facility] Providers Cardiologist:  New  Patient Profile:   Milano Rosevear is a 72 y.o. male with a hx of HTN, alcohol abuse  who is being seen 02/25/2022 for the evaluation of newly reduced EF at the request of Dr Lucianne Muss.  History of Present Illness:   Mr. Hannum  has not seen cardiology in the past. He consumes greater than 6 glasses of whiskey daily. He denies tobacco or drug history. He does not see PCP regularly. He lives by himself.   The patient presented to [facility] ED 02/20/22 after being found down at home. He was found by a friend at home and EMS was called. The patient has no memory surrounding the event that day. EMS reported 3 seizures.  He was administered Versed and route. Patient was unable to give a account of the events, history obtained from prior notes.  In the ER patient was noted to be postictal.  Patient had another seizure and was unable to protect his airway, therefore he was subsequently intubated by the ED provider.  BP 173/111, pulse 119bpm, RR 18, normal O2. Labs showed CO2 16, AST 69, LA>9, salicylate <7, creatinine 1.1.  Hgb 9.6.  High-sensitivity troponin 27, WBC 7.9.  UDS showed marijuana and Benzos.  Chest x-ray showed no infiltrate or effusion.  CT of the head showed no signs of bleeding, possibly old infarct, atrophy, small vessel disease.  CT of the cervical spine showed no fracture.  Patient was admitted by critical care with altered mental status, seizures, EtOH withdrawal, aspiration pneumonia, metabolic acidosis, and lactic acidosis.  Patient was extubated on 02/12/2022.  He was stabilized in the ICU and transferred to hospitalist care on 1-24.  High-sensitivity troponin peaked at 291.  Echo from 1-24 showed LVEF 30 to 35%, moderate asymmetric LVH, indeterminate  diastolic parameters, normal RV function, moderate elevated pulmonary artery systolic pressure, trivial MR.  Cardiology was asked to see.   Past Medical History:  Diagnosis Date   Hypertension     Past Surgical History:  Procedure Laterality Date   JOINT REPLACEMENT       Home Medications:  Prior to Admission medications   Medication Sig Start Date End Date Taking? Authorizing Provider  amLODipine (NORVASC) 10 MG tablet Take 1 tablet (10 mg total) by mouth daily. 04/04/19  Yes Jene Every, MD  atorvastatin (LIPITOR) 40 MG tablet Take 40 mg by mouth daily.   Yes [provider]  hydrochlorothiazide (HYDRODIURIL) 25 MG tablet Take 25 mg by mouth daily.   Yes [provider]    Inpatient Medications: Scheduled Meds:  amLODipine  10 mg Oral Daily   Chlorhexidine Gluconate Cloth  6 each Topical Daily   folic acid  1 mg Oral Daily   heparin  5,000 Units Subcutaneous Q8H   LORazepam  1 mg Intravenous Once   metoprolol tartrate  2.5 mg Intravenous Once   multivitamin with minerals  1 tablet Oral Daily   pantoprazole (PROTONIX) IV  40 mg Intravenous QHS   potassium chloride  40 mEq Oral Q4H   thiamine (VITAMIN B1) injection  100 mg Intravenous Q24H   Vitamin D (Ergocalciferol)  50,000 Units Oral Q7 days   Continuous Infusions:  ampicillin-sulbactam (UNASYN) IV 3 g (02/25/22 1204)   levETIRAcetam 1,000 mg (02/25/22 1216)   PRN Meds:  acetaminophen, bisacodyl, bisacodyl, ibuprofen, LORazepam **OR** LORazepam, metoprolol tartrate, polyethylene glycol  Allergies:   No Known Allergies  Social History:   Social History   Socioeconomic History   Marital status: Single    Spouse name: Not on file   Number of children: Not on file   Years of education: Not on file   Highest education level: Not on file  Occupational History   Not on file  Tobacco Use   Smoking status: Former   Smokeless tobacco: Never  Substance and Sexual Activity   Alcohol use: Yes     Alcohol/week: 28.0 standard drinks of alcohol    Types: 28 Cans of beer per week    Comment: drinks 1/5 per week or more   Drug use: Yes    Types: Marijuana   Sexual activity: Not on file  Other Topics Concern   Not on file  Social History Narrative   Not on file   Social Determinants of Health   Financial Resource Strain: Not on file  Food Insecurity: Not on file  Transportation Needs: Not on file  Physical Activity: Not on file  Stress: Not on file  Social Connections: Not on file  Intimate Partner Violence: Not on file    Family History:   History reviewed. No pertinent family history.   ROS:  Please see the history of present illness.   All other ROS reviewed and negative.     Physical Exam/Data:   Vitals:   02/25/22 0416 02/25/22 0500 02/25/22 0831 02/25/22 1204  BP: (!) 147/95  (!) 136/90 (!) 139/98  Pulse: 83  78 83  Resp: 16  19 16   Temp: 97.7 F (36.5 C)  97.8 F (36.6 C) 98.5 F (36.9 C)  TempSrc: Oral     SpO2: 100%  100% 100%  Weight:  83.4 kg    Height:        Intake/Output Summary (Last 24 hours) at 02/25/2022 1528 Last data filed at 02/25/2022 1402 Gross per 24 hour  Intake 840 ml  Output --  Net 840 ml      02/25/2022    5:00 AM 02/24/2022    4:10 AM 02/23/2022    4:16 AM  Last 3 Weights  Weight (lbs) 183 lb 13.8 oz 173 lb 15.1 oz 173 lb 15.1 oz  Weight (kg) 83.4 kg 78.9 kg 78.9 kg     Body mass index is 25.64 kg/m.  General:  Well nourished, well developed, in no acute distress HEENT: normal Neck: no JVD Vascular: No carotid bruits; Distal pulses 2+ bilaterally Cardiac:  normal S1, S2; RRR; no murmur  Lungs:  clear to auscultation bilaterally, no wheezing, rhonchi or rales  Abd: soft, nontender, no hepatomegaly  Ext: no edema Musculoskeletal:  No deformities, BUE and BLE strength normal and equal Skin: warm and dry  Neuro:  CNs 2-12 intact, no focal abnormalities noted Psych:  Normal affect   EKG:  The EKG was personally reviewed and  demonstrates: EKG from 02/22/2022:normal sinus rhythm with PACs, 75 bpm, significant T wave inversions, QTc 502 ms Telemetry:  Telemetry was personally reviewed and demonstrates: Sinus rhythm with PACs  Relevant CV Studies:  Echo 02/24/21  1. Left ventricular ejection fraction, by estimation, is 30 to 35%. The  left ventricle has moderately decreased function. The left ventricle  demonstrates regional wall motion abnormalities (see scoring  diagram/findings for description). There is  moderate asymmetric left ventricular hypertrophy of the basal-septal  segment. Left ventricular diastolic parameters are  indeterminate.   2. Right ventricular systolic function is normal. The right ventricular  size is normal. There is moderately elevated pulmonary artery systolic  pressure. The estimated right ventricular systolic pressure is 52.1 mmHg.   3. The mitral valve is normal in structure. Trivial mitral valve  regurgitation.   4. The aortic valve is tricuspid. Aortic valve regurgitation is not  visualized. No aortic stenosis is present.   5. The inferior vena cava is normal in size with <50% respiratory  variability, suggesting right atrial pressure of 8 mmHg.   Echo 02/2019  1. Left ventricular ejection fraction, by visual estimation, is 60 to  65%. The left ventricle has normal function. There is mildly increased  left ventricular hypertrophy.   2. Left ventricular diastolic parameters are consistent with Grade I  diastolic dysfunction (impaired relaxation).   3. The left ventricle has no regional wall motion abnormalities.   4. Global right ventricle has normal systolic function.The right  ventricular size is normal. No increase in right ventricular wall  thickness.   5. Left atrial size was mildly dilated.   6. Right atrial size was mildly dilated.   7. The mitral valve is normal in structure. No evidence of mitral valve  regurgitation. No evidence of mitral stenosis.   8. The tricuspid  valve is normal in structure.   9. The aortic valve is normal in structure. Aortic valve regurgitation is  not visualized. No evidence of aortic valve sclerosis or stenosis.  10. The pulmonic valve was not well visualized. Pulmonic valve  regurgitation is not visualized.  11. The inferior vena cava is normal in size with greater than 50%  respiratory variability, suggesting right atrial pressure of 3 mmHg.   Laboratory Data:  High Sensitivity Troponin:   Recent Labs  Lab 02/20/22 1747 02/20/22 2039 02/20/22 2245 02/21/22 1033 02/21/22 1258  TROPONINIHS 124* 188* 291* 268* 258*     Chemistry Recent Labs  Lab 02/23/22 0431 02/24/22 0426 02/25/22 0542  NA 142 142 139  K 3.2* 3.0* 2.9*  CL 110 104 107  CO2 22 24 24   GLUCOSE 95 95 118*  BUN 11 13 17   CREATININE 0.93 1.09 1.03  CALCIUM 8.7* 8.7* 8.5*  MG 1.8 1.7 1.7  GFRNONAA >60 >60 >60  ANIONGAP 10 14 8     Recent Labs  Lab 02/20/22 1547 02/21/22 0418 02/22/22 0434 02/23/22 0431 02/24/22 0426  PROT 6.2*  --   --   --   --   ALBUMIN 3.4*   < > 2.5* 2.6* 2.5*  AST 69*  --   --   --   --   ALT 20  --   --   --   --   ALKPHOS 63  --   --   --   --   BILITOT 0.8  --   --   --   --    < > = values in this interval not displayed.   Lipids No results for input(s): "CHOL", "TRIG", "HDL", "LABVLDL", "LDLCALC", "CHOLHDL" in the last 168 hours.  Hematology Recent Labs  Lab 02/23/22 0431 02/24/22 0426 02/25/22 0542  WBC 6.6 8.5 7.8  RBC 2.87* 2.71* 2.43*  HGB 9.7* 9.0* 8.2*  HCT 28.7* 27.5* 24.6*  MCV 100.0 101.5* 101.2*  MCH 33.8 33.2 33.7  MCHC 33.8 32.7 33.3  RDW 19.0* 18.2* 17.6*  PLT 149* 136* 129*   Thyroid No results for input(s): "TSH", "FREET4" in the last 168 hours.  BNP Recent  Labs  Lab 02/23/22 1441  BNP 1,539.0*    DDimer No results for input(s): "DDIMER" in the last 168 hours.   Radiology/Studies:  ECHOCARDIOGRAM COMPLETE  Result Date: 02/24/2022    ECHOCARDIOGRAM REPORT   Patient Name:    HARALAMBOS DEMIRJIAN Date of Exam: 02/24/2022 Medical Rec #:  [id number]      Height:       71.0 in Accession #:    [id number]     Weight:       173.9 lb Date of Birth:  [birth year]      BSA:          1.987 m Patient Age:    88 years       BP:           151/93 mmHg Patient Gender: M              HR:           154 bpm. Exam Location:  [facility] Procedure: 2D Echo and Intracardiac Opacification Agent Indications:    elevated troponin  History:        Patient has prior history of Echocardiogram examinations, most                 recent 03/08/2019. Stroke; Risk Factors:Hypertension.  Sonographer:    Cathie Hoops Referring Phys: [id number] DANA G NELSON  Sonographer Comments: Technically difficult study due to poor echo windows. IMPRESSIONS  1. Left ventricular ejection fraction, by estimation, is 30 to 35%. The left ventricle has moderately decreased function. The left ventricle demonstrates regional wall motion abnormalities (see scoring diagram/findings for description). There is moderate asymmetric left ventricular hypertrophy of the basal-septal segment. Left ventricular diastolic parameters are indeterminate.  2. Right ventricular systolic function is normal. The right ventricular size is normal. There is moderately elevated pulmonary artery systolic pressure. The estimated right ventricular systolic pressure is 52.1 mmHg.  3. The mitral valve is normal in structure. Trivial mitral valve regurgitation.  4. The aortic valve is tricuspid. Aortic valve regurgitation is not visualized. No aortic stenosis is present.  5. The inferior vena cava is normal in size with <50% respiratory variability, suggesting right atrial pressure of 8 mmHg. FINDINGS  Left Ventricle: Left ventricular ejection fraction, by estimation, is 30 to 35%. The left ventricle has moderately decreased function. The left ventricle demonstrates regional wall motion abnormalities. Definity contrast agent was given IV to delineate the left ventricular endocardial borders.  The left ventricular internal cavity size was normal in size. There is moderate asymmetric left ventricular hypertrophy of the basal-septal segment. Left ventricular diastolic parameters are indeterminate.  LV Wall Scoring: The mid and distal anterior wall, mid and distal anterior septum, and entire apex are hypokinetic. The antero-lateral wall, inferior wall, posterior wall, basal anteroseptal segment, mid inferoseptal segment, basal anterior segment, and basal inferoseptal segment are normal. Right Ventricle: The right ventricular size is normal. Right vetricular wall thickness was not well visualized. Right ventricular systolic function is normal. There is moderately elevated pulmonary artery systolic pressure. The tricuspid regurgitant velocity is 3.32 m/s, and with an assumed right atrial pressure of 8 mmHg, the estimated right ventricular systolic pressure is 52.1 mmHg. Left Atrium: Left atrial size was normal in size. Right Atrium: Right atrial size was normal in size. Pericardium: There is no evidence of pericardial effusion. Mitral Valve: The mitral valve is normal in structure. Trivial mitral valve regurgitation. Tricuspid Valve: The tricuspid valve is not well visualized. Tricuspid valve regurgitation is trivial. Aortic Valve:  The aortic valve is tricuspid. Aortic valve regurgitation is not visualized. No aortic stenosis is present. Aortic valve mean gradient measures 3.0 mmHg. Aortic valve peak gradient measures 5.3 mmHg. Aortic valve area, by VTI measures 2.28 cm. Pulmonic Valve: The pulmonic valve was normal in structure. Pulmonic valve regurgitation is not visualized. No evidence of pulmonic stenosis. Aorta: The aortic root and ascending aorta are structurally normal, with no evidence of dilitation. Pulmonary Artery: The pulmonary artery is of normal size. Venous: The inferior vena cava is normal in size with less than 50% respiratory variability, suggesting right atrial pressure of 8 mmHg.  IAS/Shunts: The interatrial septum was not well visualized.  LEFT VENTRICLE PLAX 2D LVIDd:         4.20 cm      Diastology LVIDs:         3.40 cm      LV e' medial:    13.90 cm/s LV PW:         1.00 cm      LV E/e' medial:  5.7 LV IVS:        1.30 cm      LV e' lateral:   17.20 cm/s LVOT diam:     2.00 cm      LV E/e' lateral: 4.6 LV SV:         35 LV SV Index:   18 LVOT Area:     3.14 cm  LV Volumes (MOD) LV vol d, MOD A2C: 91.2 ml LV vol d, MOD A4C: 118.5 ml LV vol s, MOD A2C: 64.1 ml LV vol s, MOD A4C: 78.0 ml LV SV MOD A2C:     27.1 ml LV SV MOD A4C:     118.5 ml LV SV MOD BP:      31.6 ml RIGHT VENTRICLE RV Basal diam:  3.10 cm RV Mid diam:    2.80 cm RV S prime:     16.60 cm/s TAPSE (M-mode): 1.8 cm LEFT ATRIUM             Index        RIGHT ATRIUM           Index LA diam:        3.40 cm 1.71 cm/m   RA Area:     11.90 cm LA Vol (A2C):   30.5 ml 15.35 ml/m  RA Volume:   24.40 ml  12.28 ml/m LA Vol (A4C):   36.8 ml 18.52 ml/m LA Biplane Vol: 33.6 ml 16.91 ml/m  AORTIC VALVE                    PULMONIC VALVE AV Area (Vmax):    2.32 cm     PV Vmax:       0.95 m/s AV Area (Vmean):   2.22 cm     PV Peak grad:  3.6 mmHg AV Area (VTI):     2.28 cm AV Vmax:           115.00 cm/s AV Vmean:          82.300 cm/s AV VTI:            0.156 m AV Peak Grad:      5.3 mmHg AV Mean Grad:      3.0 mmHg LVOT Vmax:         84.90 cm/s LVOT Vmean:        58.200 cm/s LVOT VTI:          0.113  m LVOT/AV VTI ratio: 0.72  AORTA Ao Root diam: 3.20 cm MITRAL VALVE               TRICUSPID VALVE MV Area (PHT): 5.54 cm    TR Peak grad:   44.1 mmHg MV Decel Time: 137 msec    TR Vmax:        332.00 cm/s MR Peak grad: 35.4 mmHg MR Vmax:      297.67 cm/s  SHUNTS MV E velocity: 78.60 cm/s  Systemic VTI:  0.11 m MV A velocity: 89.10 cm/s  Systemic Diam: 2.00 cm MV E/A ratio:  0.88 Cristal Deer End MD Electronically signed by Yvonne Lemarcus MD Signature Date/Time: 02/24/2022/3:40:48 PM    Final    DG Chest Port 1 View  Result Date:  02/23/2022 CLINICAL DATA:  Aspiration. EXAM: PORTABLE CHEST 1 VIEW COMPARISON:  02/21/2022 FINDINGS: This is a low volume study. The cardiomediastinal silhouette is unchanged. Endotracheal tube and NG tube have been removed. New LEFT LOWER lung opacity is noted. No pneumothorax or large pleural effusion noted. No acute bony abnormalities are present. IMPRESSION: New LEFT LOWER lung opacity which may represent aspiration, pneumonia and/or atelectasis. Electronically Signed   By: Harmon Pier M.D.   On: 02/23/2022 15:04   DG Ankle Left Port  Result Date: 02/23/2022 CLINICAL DATA:  72 year old male with history of edema in the right ankle. EXAM: PORTABLE LEFT ANKLE - 2 VIEW COMPARISON:  No priors. FINDINGS: Two views of the right ankle demonstrate no acute displaced fracture, subluxation or dislocation. No destructive osseous lesions. Soft tissues are minimally swollen. IMPRESSION: 1. No acute osseous abnormality of the right ankle. Electronically Signed   By: Trudie Reed M.D.   On: 02/23/2022 12:19     Assessment and Plan:   Newly reduced LVEF -Patient was admitted 02/20/2022 after being found down and unresponsive by a friend at home. He required intubation and admission to the ICU for AMS, aspiration pneumonia, metabolic acidosis, lactic acidosis, hypoglycemia, seizure disorder, ?  EtOH withdrawal.   Patient was extubated 02/24/29 and transferred to the floor. Mental status is improved, but patient still has no recollection of the event. -Echo 02/24/21 showed LVEF 30 to 35%, moderate asymmetric left ventricular hypertrophy, indeterminate diastolic parameters, normal RV SF, trivial MR. -Echo from 2021 showed LVEF 60 to 65%, grade 1 diastolic dysfunction, mildly dilated by atrium. -Patient with no significant cardiac history -High-sensitivity troponin peaked to 291.  No chest pain reported however memory of the event is poor. -Patient consumes greater than 6 glasses of whiskey daily -Patient appears  overall euvolemic on exam -Once recovered, patient may likely require noninvasive ischemic evaluation.  Do not think he is a good candidate for cardiac cath. May be ETOH induced cardiomyopathy. -Add guideline active medical therapy as tolerated.  Would stop amlodipine.  Elevated troponin -Troponin peaked to 291 -No CAD history, as above - A1C normal - check lipid panel  - suspect supply demand mismatch in the setting of multiple acute issues -Plan as above  Aspiration pneumonia -Antibiotics per IM  Hyperglycemia -A1c 5.0 -SSI per IM  hypokalemia - K 2.9 -Supplementation ordered -Goal K greater than 4  Seizure disorder - seen by neurology - IV keppra  Polysubstance use - UDS showed cannabinoid and benzos - patient reported significant alcohol use - CIWA per IM  Iron deficiency anemia - Hgb 8-9 during hospitalization - in August Hgb 11-12 - per IM  For questions or updates, please contact [facility] Please  consult [email or web address] for contact info under    Signed, Auna Mikkelsen David Stall, PA-C  02/25/2022 3:28 PM

## 2022-02-25 NOTE — Inpatient Diabetes Management (Signed)
Inpatient Diabetes Program Recommendations  AACE/ADA: New Consensus Statement on Inpatient Glycemic Control   Target Ranges:  Prepandial:   less than 140 mg/dL      Peak postprandial:   less than 180 mg/dL (1-2 hours)      Critically ill patients:  140 - 180 mg/dL     Latest Reference Range & Units 02/24/22 07:59 02/24/22 11:38 02/24/22 16:09 02/24/22 20:37 02/24/22 23:47 02/25/22 00:22 02/25/22 01:02 02/25/22 04:12  Glucose-Capillary 70 - 99 mg/dL 81 86 120 (H) 122 (H) 62 (L) 68 (L) 106 (H) 112 (H)    Latest Reference Range & Units 02/21/22 04:18  Hemoglobin A1C 4.8 - 5.6 % 5.0   Review of Glycemic Control  Diabetes history: No Outpatient Diabetes medications: NA Current orders for Inpatient glycemic control: Novolog 0-15 units Q4H  Inpatient Diabetes Program Recommendations:    Insulin: Patient received Novolog 2 units at 20:54 on 02/24/22 and glucose down to 62 mg/dl at 23:47. Please consider discontinuing Novolog.   Thanks, Barnie Alderman, RN, MSN, [place] Diabetes Coordinator Inpatient Diabetes Program [phone] (Team Pager from 8am to [place])

## 2022-02-25 NOTE — Progress Notes (Signed)
[facility] Progress Note  Patient: John Spears    JXB:[id number]  DOA: 02/20/2022     Date of Service: the patient was seen and examined on 02/25/2022  Chief Complaint  Patient presents with   Seizures   Brief hospital course: Rhoderick Farrel is a 72 year old male with a past medical history significant for hypertension and alcohol abuse who presented to [facility] ED on 02/20/2022 after being found down and unresponsive by a friend at home.  Patient was intubated and admitted into PCCM, neurology was consulted.  Patient has history of EtOH abuse, suspected withdrawal syndrome/delirium tremens.  Patient was extubated on 02/22/2022.  Patient was stabilized in the ICU and downgraded under hospitalist care on 02/24/22   Assessment and Plan:  # Aspiration pneumonia --on nasal canula  12/29 started  unasyn  SLP eval done, started diet  Blood culture negative    #Mildly elevated Troponin, suspect demand ischemia PMHx: Hypertension -Continuous cardiac monitoring -Maintain MAP >65 -s/p IV fluids S/p Vasopressors as needed to maintain MAP goal -Trend lactic acid until normalized -Troponin peaked 291, trending down. TTE shows LVEF 30 to 35%, LV moderately decreased function, no regional wall motion abnormality, moderate LV hypertrophy of the basal septal segment.  Moderately elevated pulmonary artery systolic pressure RVSP 52 mmHg Cardiology consulted   # Hypokalemia secondary to diarrhea, developed on at night on 1/2 Potassium repleted. Monitor and replete as needed   #AG Metabolic Acidosis, Resolved  #Lactic Acidosis, suspect due to recurrent seizures -Monitor I&O's / urinary output -Follow BMP -Ensure adequate renal perfusion -Avoid nephrotoxic agents as able -Replace electrolytes as indicated -s/p IV fluids   #Hyperglycemia -CBG's q4h; Target range of 140 to 180 -SSI -Follow ICU Hypo/Hyperglycemia protocol   Seizure disorder Continue Keppra 1 g IV q. 12 hourly,  transition to oral when cleared by SLP Continue fall precaution and seizure precautions Follow neurology   #Delerium Tremens - RESOLVED  -S/P FULL SCOPE OF THERAPY  WITH SEDATION WHILE ON VENTILATOR   Iron deficiency anemia, transferrin saturation 13%, we will start oral iron supplement.  Patient is able to swallow  Vitamin D deficiency: started vitamin D 50,000 units p.o. weekly, follow with PCP to repeat vitamin D level after 3 to 6 months.   Body mass index is 24.26 kg/m.  Interventions:       Diet: Dysphagia 2 diet DVT Prophylaxis: Subcutaneous Heparin    Advance goals of care discussion: Full code  Family Communication: family was NOT present at bedside, at the time of interview.  The pt provided permission to discuss medical plan with the family. Opportunity was given to ask question and all questions were answered satisfactorily.   Disposition:  Pt is from Home, admitted with seizures and delirium tremens s/p intubation, aspiration pneumonia, on IV antibiotics, which precludes a safe discharge. Discharge to SNF, when medically stable, may need 1-2 more days   Subjective: No significant overnight events, patient is slightly confused, AO x 2, spiked a fever yesterday evening, no fever since last night.  Patient stated that he is feeling fine, unable to offer any complaints, no chest pain or palpitation, no shortness of breath.   Physical Exam: General:  alert oriented to place and person.  Appear in mild distress, affect appropriate Eyes: PERRLA ENT: Oral Mucosa Clear, moist  Neck: NO JVD,  Cardiovascular: S1 and S2 Present, NO Murmur,  Respiratory: good respiratory effort, Bilateral Air entry equal and Decreased, mild Crackles, no wheezes Abdomen: Bowel Sound present, Soft and no  tenderness,  Skin: no rashes Extremities: no Pedal edema, no calf tenderness Neurologic: without any new focal findings Gait not checked due to patient safety concerns  Vitals:    02/25/22 0416 02/25/22 0500 02/25/22 0831 02/25/22 1204  BP: (!) 147/95  (!) 136/90 (!) 139/98  Pulse: 83  78 83  Resp: 16  19 16   Temp: 97.7 F (36.5 C)  97.8 F (36.6 C) 98.5 F (36.9 C)  TempSrc: Oral     SpO2: 100%  100% 100%  Weight:  83.4 kg    Height:        Intake/Output Summary (Last 24 hours) at 02/25/2022 1453 Last data filed at 02/25/2022 1100 Gross per 24 hour  Intake 720 ml  Output --  Net 720 ml   Filed Weights   02/23/22 0416 02/24/22 0410 02/25/22 0500  Weight: 78.9 kg 78.9 kg 83.4 kg    Data Reviewed: I have personally reviewed and interpreted daily labs, tele strips, imagings as discussed above. I reviewed all nursing notes, pharmacy notes, vitals, pertinent old records I have discussed plan of care as described above with RN and patient/family.  CBC: Recent Labs  Lab 02/20/22 1547 02/21/22 0418 02/22/22 0434 02/23/22 0431 02/24/22 0426 02/25/22 0542  WBC 7.9 6.5 5.8 6.6 8.5 7.8  NEUTROABS 3.5  --   --   --   --   --   HGB 9.6* 9.4* 9.2* 9.7* 9.0* 8.2*  HCT 30.8* 27.8* 27.3* 28.7* 27.5* 24.6*  MCV 108.5* 98.9 98.6 100.0 101.5* 101.2*  PLT 201 142* 136* 149* 136* 127*   Basic Metabolic Panel: Recent Labs  Lab 02/21/22 0418 02/21/22 1032 02/22/22 0434 02/23/22 0431 02/24/22 0426 02/25/22 0542  NA 139  --  141 142 142 139  K 4.0  --  3.7 3.2* 3.0* 2.9*  CL 107  --  110 110 104 107  CO2 21*  --  24 22 24 24   GLUCOSE 97  --  99 95 95 118*  BUN 11  --  12 11 13 17   CREATININE 0.93  --  0.98 0.93 1.09 1.03  CALCIUM 8.5*  --  8.6* 8.7* 8.7* 8.5*  MG  --  1.7  --  1.8 1.7 1.7  PHOS 3.7  --  2.3* 4.1 3.4 2.7    Studies: No results found.  Scheduled Meds:  amLODipine  10 mg Oral Daily   Chlorhexidine Gluconate Cloth  6 each Topical Daily   docusate  100 mg Per Tube BID   folic acid  1 mg Oral Daily   heparin  5,000 Units Subcutaneous Q8H   LORazepam  1 mg Intravenous Once   metoprolol tartrate  2.5 mg Intravenous Once   multivitamin  with minerals  1 tablet Oral Daily   pantoprazole (PROTONIX) IV  40 mg Intravenous QHS   polyethylene glycol  17 g Per Tube Daily   thiamine (VITAMIN B1) injection  100 mg Intravenous Q24H   Vitamin D (Ergocalciferol)  50,000 Units Oral Q7 days   Continuous Infusions:  ampicillin-sulbactam (UNASYN) IV 3 g (02/25/22 1204)   levETIRAcetam 1,000 mg (02/25/22 1216)   potassium chloride 10 mEq (02/25/22 1402)   PRN Meds: acetaminophen, docusate sodium, ibuprofen, LORazepam **OR** LORazepam, metoprolol tartrate, polyethylene glycol  Time spent: 35 minutes  Author: Val Riles. MD [facility] 02/25/2022 2:53 PM  To reach On-call, see care teams to locate the attending and reach out to them via [email or web address]. If 7PM-7AM, please contact night-coverage If you still  have difficulty reaching the attending provider, please page the [facility] (Director on Call) for [facility] on amion for assistance.

## 2022-02-25 NOTE — Progress Notes (Signed)
       CROSS COVER NOTE  NAME: Rojelio Uhrich MRN: [id number] DOB : Apr 28, 1950    HPI/Events of Note   Nurse reports iv out and attending said to leave it out and would change IV med s to oral   Assessment and  Interventions   Assessment: Speech cleared patient yesterday for crushed meds Plan: Protonix changed to oral pepcid as protonix cannot tbe crushed Keppra changed to oral       Kathlene Cote NP [facility]

## 2022-02-26 DIAGNOSIS — F1010 Alcohol abuse, uncomplicated: Secondary | ICD-10-CM | POA: Diagnosis not present

## 2022-02-26 DIAGNOSIS — R569 Unspecified convulsions: Secondary | ICD-10-CM | POA: Diagnosis not present

## 2022-02-26 DIAGNOSIS — I420 Dilated cardiomyopathy: Secondary | ICD-10-CM

## 2022-02-26 DIAGNOSIS — I5043 Acute on chronic combined systolic (congestive) and diastolic (congestive) heart failure: Secondary | ICD-10-CM

## 2022-02-26 DIAGNOSIS — G9340 Encephalopathy, unspecified: Secondary | ICD-10-CM

## 2022-02-26 LAB — CBC
HCT: 24.5 % — ABNORMAL LOW (ref 39.0–52.0)
Hemoglobin: 8 g/dL — ABNORMAL LOW (ref 13.0–17.0)
MCH: 32.8 pg (ref 26.0–34.0)
MCHC: 32.7 g/dL (ref 30.0–36.0)
MCV: 100.4 fL — ABNORMAL HIGH (ref 80.0–100.0)
Platelets: 150 10*3/uL (ref 150–400)
RBC: 2.44 MIL/uL — ABNORMAL LOW (ref 4.22–5.81)
RDW: 17.3 % — ABNORMAL HIGH (ref 11.5–15.5)
WBC: 4.1 10*3/uL (ref 4.0–10.5)
nRBC: 0 % (ref 0.0–0.2)

## 2022-02-26 LAB — BASIC METABOLIC PANEL
Anion gap: 6 (ref 5–15)
BUN: 13 mg/dL (ref 8–23)
CO2: 24 mmol/L (ref 22–32)
Calcium: 8.7 mg/dL — ABNORMAL LOW (ref 8.9–10.3)
Chloride: 108 mmol/L (ref 98–111)
Creatinine, Ser: 0.83 mg/dL (ref 0.61–1.24)
GFR, Estimated: >60 mL/min (ref >60)
Glucose, Bld: 90 mg/dL (ref 70–99)
Potassium: 3.8 mmol/L (ref 3.5–5.1)
Sodium: 138 mmol/L (ref 135–145)

## 2022-02-26 LAB — URINALYSIS, ROUTINE W REFLEX MICROSCOPIC
Bilirubin Urine: NEGATIVE
Glucose, UA: NEGATIVE mg/dL
Hgb urine dipstick: NEGATIVE
Ketones, ur: NEGATIVE mg/dL
Leukocytes,Ua: NEGATIVE
Nitrite: NEGATIVE
Protein, ur: NEGATIVE mg/dL
Specific Gravity, Urine: 1.01 (ref 1.005–1.030)
pH: 5 (ref 5.0–8.0)

## 2022-02-26 LAB — GLUCOSE, CAPILLARY
Glucose-Capillary: 106 mg/dL — ABNORMAL HIGH (ref 70–99)
Glucose-Capillary: 110 mg/dL — ABNORMAL HIGH (ref 70–99)
Glucose-Capillary: 75 mg/dL (ref 70–99)
Glucose-Capillary: 81 mg/dL (ref 70–99)
Glucose-Capillary: 87 mg/dL (ref 70–99)
Glucose-Capillary: 95 mg/dL (ref 70–99)

## 2022-02-26 LAB — MAGNESIUM: Magnesium: 2 mg/dL (ref 1.7–2.4)

## 2022-02-26 LAB — PHOSPHORUS: Phosphorus: 2.8 mg/dL (ref 2.5–4.6)

## 2022-02-26 MED ORDER — POLYSACCHARIDE IRON COMPLEX 150 MG PO CAPS
150.0000 mg | ORAL_CAPSULE | Freq: Every day | ORAL | Status: DC
  Administered 2022-02-26 – 2022-03-03 (×6): 150 mg via ORAL
  Filled 2022-02-26 (×6): qty 1

## 2022-02-26 MED ORDER — GUAIFENESIN ER 600 MG PO TB12
600.0000 mg | ORAL_TABLET | Freq: Two times a day (BID) | ORAL | Status: DC
  Administered 2022-02-26 – 2022-03-03 (×11): 600 mg via ORAL
  Filled 2022-02-26 (×11): qty 1

## 2022-02-26 MED ORDER — CARVEDILOL 6.25 MG PO TABS
12.5000 mg | ORAL_TABLET | Freq: Two times a day (BID) | ORAL | Status: DC
  Administered 2022-02-26 – 2022-02-27 (×3): 12.5 mg via ORAL
  Filled 2022-02-26 (×3): qty 2

## 2022-02-26 MED ORDER — FUROSEMIDE 10 MG/ML IJ SOLN: 40.0000 mg | Freq: Two times a day (BID) | INTRAMUSCULAR | Status: DC

## 2022-02-26 MED ORDER — LOSARTAN POTASSIUM 50 MG PO TABS: 50.0000 mg | ORAL_TABLET | Freq: Every day | ORAL | Status: DC

## 2022-02-26 MED ORDER — FUROSEMIDE 40 MG PO TABS
40.0000 mg | ORAL_TABLET | Freq: Two times a day (BID) | ORAL | Status: DC
  Administered 2022-02-26 – 2022-03-03 (×10): 40 mg via ORAL
  Filled 2022-02-26 (×10): qty 1

## 2022-02-26 MED ORDER — VITAMIN B-12 1000 MCG PO TABS
1000.0000 ug | ORAL_TABLET | Freq: Every day | ORAL | Status: DC
  Administered 2022-02-26 – 2022-03-03 (×6): 1000 ug via ORAL
  Filled 2022-02-26 (×5): qty 1

## 2022-02-26 MED ORDER — PANTOPRAZOLE SODIUM 40 MG PO TBEC
40.0000 mg | DELAYED_RELEASE_TABLET | Freq: Every day | ORAL | Status: DC
  Administered 2022-02-26 – 2022-03-03 (×6): 40 mg via ORAL
  Filled 2022-02-26 (×6): qty 1

## 2022-02-26 MED ORDER — ATORVASTATIN CALCIUM 20 MG PO TABS
40.0000 mg | ORAL_TABLET | Freq: Every day | ORAL | Status: DC
  Administered 2022-02-26 – 2022-03-03 (×6): 40 mg via ORAL
  Filled 2022-02-26 (×7): qty 2

## 2022-02-26 MED ORDER — THIAMINE MONONITRATE 100 MG PO TABS
100.0000 mg | ORAL_TABLET | Freq: Every day | ORAL | Status: DC
  Administered 2022-02-26 – 2022-03-03 (×6): 100 mg via ORAL
  Filled 2022-02-26 (×6): qty 1

## 2022-02-26 MED ORDER — SACUBITRIL-VALSARTAN 49-51 MG PO TABS
1.0000 | ORAL_TABLET | Freq: Two times a day (BID) | ORAL | Status: DC
  Administered 2022-02-26 – 2022-03-03 (×10): 1 via ORAL
  Filled 2022-02-26 (×10): qty 1

## 2022-02-26 NOTE — Progress Notes (Signed)
Cardiology Progress Note   Patient Name: John Spears Date of Encounter: 02/26/2022  Primary Cardiologist: Yvonne Quenten, MD  Subjective   Denies chest pain or dyspnea.  Currently lying flat in bed.  Sisters @ bedside.  Inpatient Medications    Scheduled Meds:  amoxicillin-clavulanate  1 tablet Oral Q12H   carvedilol  12.5 mg Oral BID WC   Chlorhexidine Gluconate Cloth  6 each Topical Daily   vitamin B-12  1,000 mcg Oral Daily   famotidine  20 mg Oral QHS   folic acid  1 mg Oral Daily   guaiFENesin  600 mg Oral BID   heparin  5,000 Units Subcutaneous Q8H   iron polysaccharides  150 mg Oral Daily   levETIRAcetam  1,000 mg Oral BID   LORazepam  1 mg Intravenous Once   multivitamin with minerals  1 tablet Oral Daily   pantoprazole  40 mg Oral Daily   sacubitril-valsartan  1 tablet Oral BID   thiamine  100 mg Oral Daily   Vitamin D (Ergocalciferol)  50,000 Units Oral Q7 days   Continuous Infusions:  PRN Meds: acetaminophen, bisacodyl, bisacodyl, ibuprofen, metoprolol tartrate, polyethylene glycol   Vital Signs    Vitals:   02/26/22 0500 02/26/22 0723 02/26/22 0840 02/26/22 1159  BP:  (!) 150/91 (!) 157/86 (!) 142/82  Pulse:  80 84 75  Resp:  19  19  Temp:  98 F (36.7 C)  98.7 F (37.1 C)  TempSrc:   Axillary   SpO2:  99%  100%  Weight: 81 kg     Height:        Intake/Output Summary (Last 24 hours) at 02/26/2022 1546 Last data filed at 02/26/2022 1000 Gross per 24 hour  Intake 240 ml  Output --  Net 240 ml   Filed Weights   02/24/22 0410 02/25/22 0500 02/26/22 0500  Weight: 78.9 kg 83.4 kg 81 kg    Physical Exam   GEN: Well nourished, well developed, in no acute distress.  HEENT: Grossly normal.  Neck: Supple, no JVD, carotid bruits, or masses. Cardiac: Irreg, irreg, no murmurs, rubs, or gallops. No clubbing, cyanosis, edema.  Radials 2+, DP/PT 2+ and equal bilaterally.  Respiratory:  Respirations regular and unlabored, clear to auscultation  bilaterally. GI: Soft, nontender, nondistended, BS + x 4. MS: no deformity or atrophy. Skin: warm and dry, no rash. Neuro:  Strength and sensation are intact. Psych: AAOx3.  Flat affect.  Labs    Chemistry Recent Labs  Lab 02/20/22 1547 02/20/22 2039 02/22/22 0434 02/23/22 0431 02/24/22 0426 02/25/22 0542 02/26/22 0628  NA 142   < > 141 142 142 139 138  K 4.2   < > 3.7 3.2* 3.0* 2.9* 3.8  CL 106   < > 110 110 104 107 108  CO2 16*   < > 24 22 24 24 24   GLUCOSE 163*   < > 99 95 95 118* 90  BUN 11   < > 12 11 13 17 13   CREATININE 1.10   < > 0.98 0.93 1.09 1.03 0.83  CALCIUM 9.3   < > 8.6* 8.7* 8.7* 8.5* 8.7*  PROT 6.2*  --   --   --   --   --   --   ALBUMIN 3.4*   < > 2.5* 2.6* 2.5*  --   --   AST 69*  --   --   --   --   --   --   ALT  20  --   --   --   --   --   --   ALKPHOS 63  --   --   --   --   --   --   BILITOT 0.8  --   --   --   --   --   --   GFRNONAA >60   < > >60 >60 >60 >60 >60  ANIONGAP 20*   < > 7 10 14 8 6    < > = values in this interval not displayed.     Hematology Recent Labs  Lab 02/24/22 0426 02/25/22 0542 02/26/22 0628  WBC 8.5 7.8 4.1  RBC 2.71* 2.43* 2.44*  HGB 9.0* 8.2* 8.0*  HCT 27.5* 24.6* 24.5*  MCV 101.5* 101.2* 100.4*  MCH 33.2 33.7 32.8  MCHC 32.7 33.3 32.7  RDW 18.2* 17.6* 17.3*  PLT 136* 129* 150    Cardiac Enzymes  Recent Labs  Lab 02/20/22 1747 02/20/22 2039 02/20/22 2245 02/21/22 1033 02/21/22 1258  TROPONINIHS 124* 188* 291* 268* 258*      BNP    Component Value Date/Time   BNP 1,539.0 (H) 02/23/2022 1441   Lipids  Lab Results  Component Value Date   CHOL 254 (H) 03/08/2019   HDL 111 03/08/2019   LDLCALC 133 (H) 03/08/2019   TRIG 51 03/08/2019   CHOLHDL 2.3 03/08/2019    HbA1c  Lab Results  Component Value Date   HGBA1C 5.0 02/21/2022    Radiology    DG Chest Port 1 View  Result Date: 02/23/2022 CLINICAL DATA:  Aspiration. EXAM: PORTABLE CHEST 1 VIEW COMPARISON:  02/21/2022 FINDINGS: This is a  low volume study. The cardiomediastinal silhouette is unchanged. Endotracheal tube and NG tube have been removed. New LEFT LOWER lung opacity is noted. No pneumothorax or large pleural effusion noted. No acute bony abnormalities are present. IMPRESSION: New LEFT LOWER lung opacity which may represent aspiration, pneumonia and/or atelectasis. Electronically Signed   By: Margarette Canada M.D.   On: 02/23/2022 15:04   DG Ankle Left Port  Result Date: 02/23/2022 CLINICAL DATA:  72 year old male with history of edema in the right ankle. EXAM: PORTABLE LEFT ANKLE - 2 VIEW COMPARISON:  No priors. FINDINGS: Two views of the right ankle demonstrate no acute displaced fracture, subluxation or dislocation. No destructive osseous lesions. Soft tissues are minimally swollen. IMPRESSION: 1. No acute osseous abnormality of the right ankle. Electronically Signed   By: Vinnie Langton M.D.   On: 02/23/2022 12:19    Telemetry    RSR w/ freq PACs - Personally Reviewed  Cardiac Studies   2D Echocardiogram 1.2.2024  1. Left ventricular ejection fraction, by estimation, is 30 to 35%. The  left ventricle has moderately decreased function. The left ventricle  demonstrates regional wall motion abnormalities (see scoring  diagram/findings for description). There is  moderate asymmetric left ventricular hypertrophy of the basal-septal  segment. Left ventricular diastolic parameters are indeterminate.   2. Right ventricular systolic function is normal. The right ventricular  size is normal. There is moderately elevated pulmonary artery systolic  pressure. The estimated right ventricular systolic pressure is 26.7 mmHg.   3. The mitral valve is normal in structure. Trivial mitral valve  regurgitation.   4. The aortic valve is tricuspid. Aortic valve regurgitation is not  visualized. No aortic stenosis is present.   5. The inferior vena cava is normal in size with <50% respiratory  variability, suggesting right atrial  pressure of  8 mmHg.  Patient Profile     71 y.o. male ? w/a h/o HTN and ETOH abuse, who was admitted 12/29 after being found down @ home by a friend. Sz activity reported by EMS. Admitted by CCM w/ VDRF (extub 12/31), AMS, sz, etoh w/d, asp PNA, and metabolic/lactic acidosis.  HsTrop up to 268.  Echo w/ new LV dysfxn - 30-35% w/ mod asymm hypertrophy of basal septum, and mid-apical anterior/anterolateral HK.  Nl RV fxn, and RVSP 52.1 mmHg.  Assessment & Plan    1.  Cardiomyopathy/Acute HFrEF:  presented after being found unresponsive by friend w/ sz activity reported by EMS.  Lactic acidosis in ED w/ resp failure req intubation  Extubated 12/31.  HsTrop up to 268.  Echo w/ new LV dysfxn - 30-35% w/ mod asymm hypertrophy of basal septum, and mid-apical anterior/anterolateral HK.  Nl RV fxn, and RVSP 52.1 mmHg.  ? blocker and ARB added 1/3.  No UO recorded 1/3, thus I/O's inaccurate.  Wt listed @ 81 kg, which is 2.3 kg < yesterday but 2.2 higher than 1/2.  With elevated BP and low EF, will transition ARB to entresto.  Potentially look to add spiro tomorrow, but will need to ensure good outpt compliance.  Prob not the best candidate for SGLT2i in the setting of obesity, limited activity, and noncompliance.    2.  Demand ischemia:  As above, new LV dysfxn w/ HsTrop up to 268.  He denies chest pain but sister says that he has been complaining of left sided/axillary pain for many months.  CT Abd/pelvix 09/2021 w/ atherosclerotic Ca2+ of abd Ao and branch vessels.  Echo w/  newly reduced EF.  Will need ischemic eval as outpt - ideally once encephalopathy has cleared.  Cont ? blocker.  Add statin.  Hold off on ASA in setting of anemia.  3.  Aspiration PNA:  In setting of szs/unresponsiveness on arrival.  Afebrile. WBC nl.  Abx per IM.  4.  Essential HTN:  BPs trending high.  Amlodipine d/c'd 1/3.  Cont ? blocker.  Transitioning ARB to entresto.  Follow.  Consider MRA.  5.  Hypokalemia:  Improved.  K 3.8 this  AM.  Consider MRA tomorrow.  6.  Sz d/o:  Keppra per IM.  7.  Polysubstance abuse:  UDS + cannabinoid and benzos.  Pt also w/ h/o ETOH use.  CIWA per IM.  8.  Macrocytic anemia:  In setting of etoh use.  Stable.  9.  Hematuria:  dark red urine noted in suction canister. Repeat UA.  Signed, Murray Hodgkins, NP  02/26/2022, 3:46 PM    For questions or updates, please contact   Please consult [email or web address] for contact info under Cardiology/STEMI.

## 2022-02-26 NOTE — TOC Progression Note (Addendum)
Transition of Care [facility]) - Progression Note    Patient Details  Name: John Spears MRN: [id number] Date of Birth: 07-17-1950  [facility] CM/SW Contact  Gerilyn Pilgrim, LCSW Phone Number: 02/26/2022, 12:22 PM  Clinical Narrative: SW lvm with Delilah Shan, patients son to discuss SNF options. SW will follow up with additonal family members listed in chart.       1:53pm SW spoke with sister who reports she would like the pt to go to [facility]    2:17pm [facility]errals [facility] to [facility].   Expected Discharge Plan and Services                                               Social Determinants of Health (SDOH) Interventions SDOH Screenings   Tobacco Use: Medium Risk (02/20/2022)    Readmission Risk Interventions     No data to display

## 2022-02-26 NOTE — NC FL2 (Signed)
[place] LEVEL OF CARE FORM     IDENTIFICATION  Patient Name: John Spears Birthdate: 24-Apr-1950 Sex: male Admission Date (Current Location): 02/20/2022  [facility] [place] Number:  Engineering geologist and Address:  [facility], [address], [place], [place] [address]      Provider Number: [id number]  Attending Physician Name and Address:  Val Riles, MD  Relative Name and Phone Number:  VHQIO,[phone]    Current Level of Care: Hospital Recommended Level of Care: [place] Prior Approval Number:    Date Approved/Denied:   PASRR Number: [id number]  Discharge Plan: SNF    Current Diagnoses: Patient Active Problem List   Diagnosis Date Noted   Seizures ([place]) 02/20/2022   Hypomagnesemia    Hypokalemia    Transaminitis    Traumatic rhabdomyolysis ([place])    Thrombocytopenia ([place])    Weakness    Lower extremity weakness 03/07/2019   Acute CVA (cerebrovascular accident) ([place]) 04/05/2016    Orientation RESPIRATION BLADDER Height & Weight     Self, Place  Normal External catheter Weight: 178 lb 9.2 oz (81 kg) Height:  5\' 11"  (180.3 cm)  BEHAVIORAL SYMPTOMS/MOOD NEUROLOGICAL BOWEL NUTRITION STATUS    Convulsions/Seizures Continent Diet (dysphagia 2)  AMBULATORY STATUS COMMUNICATION OF NEEDS Skin   Limited Assist Verbally Other (Comment) (Ecchmyosis to bilaterial arms, hematoma on forehead.)                       Personal Care Assistance Level of Assistance  Bathing, Feeding, Dressing Bathing Assistance: Maximum assistance Feeding assistance: Maximum assistance Dressing Assistance: Maximum assistance     Functional Limitations Info  Sight, Hearing, Speech Sight Info: Adequate Hearing Info: Adequate Speech Info: Adequate    SPECIAL CARE FACTORS FREQUENCY  PT (By licensed PT), OT (By licensed OT), Speech therapy     PT Frequency: 5 times a week OT Frequency: 5 times a week      Speech Therapy Frequency: Eval and treat at facility      Contractures Contractures Info: Not present    Additional Factors Info  Code Status, Allergies Code Status Info: FULL Allergies Info: none known           Current Medications (02/26/2022):  This is the current hospital active medication list Current Facility-Administered Medications  Medication Dose Route Frequency Provider Last Rate Last Admin   acetaminophen (TYLENOL) tablet 650 mg  650 mg Oral Q6H PRN Val Riles, MD   650 mg at 02/25/22 1808   amoxicillin-clavulanate (AUGMENTIN) 875-125 MG per tablet 1 tablet  1 tablet Oral Q12H Val Riles, MD   1 tablet at 02/26/22 0347   bisacodyl (DULCOLAX) EC tablet 10 mg  10 mg Oral Daily PRN Val Riles, MD       bisacodyl (DULCOLAX) suppository 10 mg  10 mg Rectal Daily PRN Val Riles, MD       carvedilol (COREG) tablet 12.5 mg  12.5 mg Oral BID WC Val Riles, MD   12.5 mg at 02/26/22 0840   Chlorhexidine Gluconate Cloth 2 % PADS 6 each  6 each Topical Daily Flora Lipps, MD   6 each at 02/26/22 1054   cyanocobalamin (VITAMIN B12) tablet 1,000 mcg  1,000 mcg Oral Daily Val Riles, MD   1,000 mcg at 02/26/22 1030   famotidine (PEPCID) tablet 20 mg  20 mg Oral QHS Sharion Settler, NP   20 mg at 42/59/56 3875   folic acid (FOLVITE) tablet 1  mg  1 mg Oral Daily Derek Jack, MD   1 mg at 02/26/22 0837   guaiFENesin (MUCINEX) 12 hr tablet 600 mg  600 mg Oral BID Val Riles, MD   600 mg at 02/26/22 1323   heparin injection 5,000 Units  5,000 Units Subcutaneous Q8H Darel Hong D, NP   5,000 Units at 02/26/22 1315   ibuprofen (ADVIL) tablet 400 mg  400 mg Oral Q6H PRN Val Riles, MD   400 mg at 02/24/22 1854   iron polysaccharides (NIFEREX) capsule 150 mg  150 mg Oral Daily Val Riles, MD   150 mg at 02/26/22 0905   levETIRAcetam (KEPPRA) tablet 1,000 mg  1,000 mg Oral BID Sharion Settler, NP   1,000 mg at 02/26/22 0837   LORazepam (ATIVAN) injection 1 mg   1 mg Intravenous Once Ottie Glazier, MD       LORazepam (ATIVAN) tablet 1-4 mg  1-4 mg Oral Q1H PRN Derek Jack, MD       Or   LORazepam (ATIVAN) injection 1-4 mg  1-4 mg Intravenous Q1H PRN Derek Jack, MD   2 mg at 02/24/22 1610   losartan (COZAAR) tablet 50 mg  50 mg Oral QHS Val Riles, MD       metoprolol tartrate (LOPRESSOR) injection 2.5 mg  2.5 mg Intravenous Q3H PRN Ottie Glazier, MD   2.5 mg at 02/23/22 1307   multivitamin with minerals tablet 1 tablet  1 tablet Oral Daily Derek Jack, MD   1 tablet at 02/26/22 0837   pantoprazole (PROTONIX) EC tablet 40 mg  40 mg Oral Daily Val Riles, MD   40 mg at 02/26/22 1323   polyethylene glycol (MIRALAX / GLYCOLAX) packet 17 g  17 g Oral Daily PRN Bradly Bienenstock, NP       thiamine (VITAMIN B1) tablet 100 mg  100 mg Oral Daily Val Riles, MD   100 mg at 02/26/22 9604   Vitamin D (Ergocalciferol) (DRISDOL) 1.25 MG (50000 UNIT) capsule 50,000 Units  50,000 Units Oral Q7 days Val Riles, MD   50,000 Units at 02/25/22 1031     Discharge Medications: Please see discharge summary for a list of discharge medications.  Relevant Imaging Results:  Relevant Lab Results:   Additional Information [phone]  Gerilyn Pilgrim, LCSW

## 2022-02-26 NOTE — Progress Notes (Signed)
Physical Therapy Treatment Patient Details Name: John Spears MRN: [id number] DOB: Jun 13, 1950 Today's Date: 02/26/2022   History of Present Illness Pt is a 72 year old male with a past medical history significant for hypertension and alcohol abuse who presented to [facility] ED on 02/20/2022 after being found down and unresponsive.  MD assessment includes: aspiration pneumonia, mildly elevated troponin with suspected demand ischemia, lactic acidosis with recurrent seizures, and hyperglycemia.    PT Comments    Pt was able to do some real in-room ambulation today but still remains far from his baseline.  He was not interested in getting up to the recliner but showed ability to rise to sitting and then standing w/o direct assist, just cuing for safety awareness, set up and sequencing.  Pt struggled to fully clean up after BM needing assist with this.  Limited in room ambulation due to fatigue and pt having head ache but ultimately showing improved mobility and activity tolerance per prior PT sessions. Continue PT to address functional limitations and get back to PLOF.     Recommendations for follow up therapy are one component of a multi-disciplinary discharge planning process, led by the attending physician.  Recommendations may be updated based on patient status, additional functional criteria and insurance authorization.  Follow Up Recommendations  Skilled nursing-short term rehab (<3 hours/day) Can patient physically be transported by private vehicle: No   Assistance Recommended at Discharge Frequent or constant Supervision/Assistance  Patient can return home with the following Assistance with cooking/housework;Direct supervision/assist for medications management;Direct supervision/assist for financial management;Assist for transportation;Help with stairs or ramp for entrance;A lot of help with walking and/or transfers;A lot of help with bathing/dressing/bathroom   Equipment Recommendations  Other  (comment) (TBD)    Recommendations for Other Services       Precautions / Restrictions Precautions Precautions: Fall Restrictions Weight Bearing Restrictions: No Other Position/Activity Restrictions: Seizure precautions     Mobility  Bed Mobility Overal bed mobility: Needs Assistance Bed Mobility: Supine to Sit, Sit to Supine, Rolling     Supine to sit: Supervision Sit to supine: Supervision   General bed mobility comments: min verbal cues for sequencing and use of bed rails but no physical assist needed    Transfers Overall transfer level: Needs assistance Equipment used: Rolling walker (2 wheels)   Sit to Stand: Min guard           General transfer comment: able to rise w/o direct assist from bed and from [facility], cuing for hand placement, walker use and sequencing    Ambulation/Gait Ambulation/Gait assistance: Min assist Gait Distance (Feet): 20 Feet Assistive device: Rolling walker (2 wheels)         General Gait Details: Pt with slow, shuffling gait into the bathroom, after using it was able to do small loop in the room again with slow, shuffling but relatively safe gait.  Pt did not wish to do more citing fatigue and headache   Stairs             Wheelchair Mobility    Modified Rankin (Stroke Patients Only)       Balance     Sitting balance-Leahy Scale: Good     Standing balance support: Bilateral upper extremity supported, During functional activity, Reliant on assistive device for balance Standing balance-Leahy Scale: Fair                              Cognition Arousal/Alertness: Awake/alert Behavior During Therapy:  Flat affect Overall Cognitive Status: Within Functional Limits for tasks assessed                                          Exercises      General Comments General comments (skin integrity, edema, etc.): encouraged pt to sit in recliner for dinner, only wants to get back to bed, asks  dinner to be pushed away at this time, "no appetite"      Pertinent Vitals/Pain Pain Assessment Pain Assessment: Faces Faces Pain Scale: Hurts little more Pain Location: Pt c/o significant head ache t/o session Pain Intervention(s): Patient requesting pain meds-RN notified    Home Living                          Prior Function            PT Goals (current goals can now be found in the care plan section) Progress towards PT goals: Progressing toward goals    Frequency    Min 2X/week      PT Plan Current plan remains appropriate    Co-evaluation              AM-PAC PT "6 Clicks" Mobility   Outcome Measure  Help needed turning from your back to your side while in a flat bed without using bedrails?: None Help needed moving from lying on your back to sitting on the side of a flat bed without using bedrails?: A Little Help needed moving to and from a bed to a chair (including a wheelchair)?: A Little Help needed standing up from a chair using your arms (e.g., wheelchair or bedside chair)?: A Little Help needed to walk in hospital room?: A Little Help needed climbing 3-5 steps with a railing? : Total 6 Click Score: 17    End of Session Equipment Utilized During Treatment: Gait belt Activity Tolerance: Patient tolerated treatment well Patient left: in bed;with call bell/phone within reach;with family/visitor present;with bed alarm set Nurse Communication: Mobility status;Patient requests pain meds PT Visit Diagnosis: Unsteadiness on feet (R26.81);Difficulty in walking, not elsewhere classified (R26.2);Muscle weakness (generalized) (M62.81)     Time: 1710-1735 PT Time Calculation (min) (ACUTE ONLY): 25 min  Charges:  $Gait Training: 8-22 mins $Therapeutic Activity: 8-22 mins                     Kreg Shropshire, DPT 02/26/2022, 5:44 PM

## 2022-02-26 NOTE — Progress Notes (Signed)
[facility] Progress Note  Patient: John Spears    [id number]  DOA: 02/20/2022     Date of Service: the patient was seen and examined on 02/26/2022  Chief Complaint  Patient presents with   Seizures   Brief hospital course: John Spears is a 72 year old male with a past medical history significant for hypertension and alcohol abuse who presented to [facility] ED on 02/20/2022 after being found down and unresponsive by a friend at home.  Patient was intubated and admitted into PCCM, neurology was consulted.  Patient has history of EtOH abuse, suspected withdrawal syndrome/delirium tremens.  Patient was extubated on 02/22/2022.  Patient was stabilized in the ICU and downgraded under hospitalist care on 02/24/22   Assessment and Plan:  # Aspiration pneumonia --on nasal canula  12/29 s/p unasyn and on 1/3 started Augmentin   SLP eval done, started diet  Blood culture negative    #Mildly elevated Troponin, suspect demand ischemia Systolic CHF, unknown acuity, POA PMHx: Hypertension -Continuous cardiac monitoring -Maintain MAP >65 -s/p IV fluids and S/p Vasopressors, lactic acid was trended  -Troponin peaked 291, trending down. TTE shows LVEF 30 to 35%, LV moderately decreased function, no regional wall motion abnormality, moderate LV hypertrophy of the basal septal segment.  Moderately elevated pulmonary artery systolic pressure RVSP 52 mmHg Cardiology consulted, recommended to discontinue amlodipine, started GMT, increased Coreg 12.5 mg p.o. twice daily and losartan 50 mg p.o. daily.  Patient will need workup possible cardiac cath as an outpatient when stable.  Recommend to follow with cardiology as an outpatient.  Continue fluid restriction 1.5 L/day, start diuretics if volume overload.   # Hypokalemia secondary to diarrhea, developed on at night on 1/2 Potassium repleted. Monitor and replete as needed   #AG Metabolic Acidosis, Resolved  #Lactic Acidosis, suspect due to  recurrent seizures -Monitor I&O's / urinary output -Follow BMP -Ensure adequate renal perfusion -Avoid nephrotoxic agents as able -Replace electrolytes as indicated -s/p IV fluids   #Hyperglycemia -CBG's q4h; Target range of 140 to 180 -SSI -Follow ICU Hypo/Hyperglycemia protocol   Seizure disorder S/p Keppra 1 g IV q. 12 hourly, transition to oral  Continue fall precaution and seizure precautions Follow neurology   #Delerium Tremens - RESOLVED  -S/P FULL SCOPE OF THERAPY  WITH SEDATION WHILE ON VENTILATOR   Iron deficiency anemia, transferrin saturation 13%, we will start oral iron supplement.  Patient is able to swallow  Vitamin D deficiency: started vitamin D 50,000 units p.o. weekly, follow with PCP to repeat vitamin D level after 3 to 6 months. Vitamin B12 level 304, target >400, started vitamin B12 oral supplement.  Body mass index is 24.26 kg/m.  Interventions:       Diet: Dysphagia 2 diet DVT Prophylaxis: Subcutaneous Heparin    Advance goals of care discussion: Full code  Family Communication: family was NOT present at bedside, at the time of interview.  The pt provided permission to discuss medical plan with the family. Opportunity was given to ask question and all questions were answered satisfactorily.   Disposition:  Pt is from Home, admitted with seizures and delirium tremens s/p intubation, aspiration pneumonia, on IV antibiotics, which precludes a safe discharge. Discharge to SNF, when bed will be available.  Clinically stable.    Subjective: No significant overnight events, patient is more alert and awake, AO x 2, still slow to respond.  Patient stated that he is feeling fine, no any other active issues.  Patient was advised to ambulate with physical  therapy.  Patient stated that he has not walked since being in the hospital.    Physical Exam: General:  alert oriented to place and person.  Appear in no distress, affect flat, depressed  Eyes:  PERRLA ENT: Oral Mucosa Clear, moist  Neck: NO JVD,  Cardiovascular: S1 and S2 Present, NO Murmur,  Respiratory: good respiratory effort, Bilateral Air entry equal and Decreased, mild Crackles, no wheezes Abdomen: Bowel Sound present, Soft and no tenderness,  Skin: no rashes Extremities: no Pedal edema, no calf tenderness Neurologic: without any new focal findings Gait not checked due to patient safety concerns  Vitals:   02/26/22 0500 02/26/22 0723 02/26/22 0840 02/26/22 1159  BP:  (!) 150/91 (!) 157/86 (!) 142/82  Pulse:  80 84 75  Resp:  19  19  Temp:  98 F (36.7 C)  98.7 F (37.1 C)  TempSrc:   Axillary   SpO2:  99%  100%  Weight: 81 kg     Height:        Intake/Output Summary (Last 24 hours) at 02/26/2022 1230 Last data filed at 02/26/2022 1000 Gross per 24 hour  Intake 360 ml  Output --  Net 360 ml   Filed Weights   02/24/22 0410 02/25/22 0500 02/26/22 0500  Weight: 78.9 kg 83.4 kg 81 kg    Data Reviewed: I have personally reviewed and interpreted daily labs, tele strips, imagings as discussed above. I reviewed all nursing notes, pharmacy notes, vitals, pertinent old records I have discussed plan of care as described above with RN and patient/family.  CBC: Recent Labs  Lab 02/20/22 1547 02/21/22 0418 02/22/22 0434 02/23/22 0431 02/24/22 0426 02/25/22 0542 02/26/22 0628  WBC 7.9   < > 5.8 6.6 8.5 7.8 4.1  NEUTROABS 3.5  --   --   --   --   --   --   HGB 9.6*   < > 9.2* 9.7* 9.0* 8.2* 8.0*  HCT 30.8*   < > 27.3* 28.7* 27.5* 24.6* 24.5*  MCV 108.5*   < > 98.6 100.0 101.5* 101.2* 100.4*  PLT 201   < > 136* 149* 136* 129* 150   < > = values in this interval not displayed.   Basic Metabolic Panel: Recent Labs  Lab 02/21/22 1032 02/22/22 0434 02/23/22 0431 02/24/22 0426 02/25/22 0542 02/26/22 0628  NA  --  141 142 142 139 138  K  --  3.7 3.2* 3.0* 2.9* 3.8  CL  --  110 110 104 107 108  CO2  --  24 22 24 24 24   GLUCOSE  --  99 95 95 118* 90  BUN   --  12 11 13 17 13   CREATININE  --  0.98 0.93 1.09 1.03 0.83  CALCIUM  --  8.6* 8.7* 8.7* 8.5* 8.7*  MG 1.7  --  1.8 1.7 1.7 2.0  PHOS  --  2.3* 4.1 3.4 2.7 2.8    Studies: No results found.  Scheduled Meds:  amoxicillin-clavulanate  1 tablet Oral Q12H   carvedilol  12.5 mg Oral BID WC   Chlorhexidine Gluconate Cloth  6 each Topical Daily   vitamin B-12  1,000 mcg Oral Daily   famotidine  20 mg Oral QHS   folic acid  1 mg Oral Daily   heparin  5,000 Units Subcutaneous Q8H   iron polysaccharides  150 mg Oral Daily   levETIRAcetam  1,000 mg Oral BID   LORazepam  1 mg Intravenous Once  losartan  50 mg Oral QHS   multivitamin with minerals  1 tablet Oral Daily   thiamine  100 mg Oral Daily   Vitamin D (Ergocalciferol)  50,000 Units Oral Q7 days   Continuous Infusions:   PRN Meds: acetaminophen, bisacodyl, bisacodyl, ibuprofen, LORazepam **OR** LORazepam, metoprolol tartrate, polyethylene glycol  Time spent: 50 minutes  Author: Val Riles. MD [facility] 02/26/2022 12:30 PM  To reach On-call, see care teams to locate the attending and reach out to them via [email or web address]. If 7PM-7AM, please contact night-coverage If you still have difficulty reaching the attending provider, please page the [facility] (Director on Call) for [facility] on amion for assistance.

## 2022-02-26 NOTE — Progress Notes (Incomplete)
KO is a 72 YO M with a PMH of HTN and EtOH abuse. He presented to the  ED 12/29 unresponsive. CC: patient has CM, aspiration PNA, and marked metabolic/electrolyte disturbances. Labs: BP: 152/81 K: 3.5 Mg: 1.5 WBC: 4.1 PLT: 174, Hgb: 8>8.7 BNP: 1,539. Allergies: NKDA. PTA: amlodipine 10, atorvastatin 40 , HCTZ 25   AC/Heme:  DVT PPX: SQH ID:  Aspiration PNA Unasyn 12/29 (evening)>> Trach cx 12/20: GPC, GN coccobacilli- reincubated MRSA PCR neg TMAX 100, wbc wnl Neuro/Pain: CV:  -HTN amlodipine stopped  CHF (new)- coreg 12.5 mg BID, losartan 50 mg QD   Pulm:  Endo:   GI/Nutrition: LBM 1/3 Hepatic:  Renal: scr at baseline  Hypokalemia resolved   Assessment/Plan: KO has high BP, low Hgb, high BNP. Culture is still pending, continue Unasyn, Augmentin f/u with abx treatment on day 4? Monitor Cbx. MD initiated GDMT HF: ARB + BB, monitor therapy. MD initiated iron polysacchardies 150 and cyanocobalamin for anemia, monitor platelet, Hgb levels.

## 2022-02-26 NOTE — Progress Notes (Signed)
Speech Language Pathology Treatment: Dysphagia  Patient Details Name: John Spears MRN: [id number] DOB: Jul 09, 1950 Today's Date: 02/26/2022 Time: 2440-1027 SLP Time Calculation (min) (ACUTE ONLY): 50 min  Assessment / Plan / Recommendation Clinical Impression  Pt seen today for ongoing f/u w/ toleration of diet; assessment of swallowing. Pt awakened w/ stim and helped to move himself upright in bed but needed MOD support and cues; verbal and responded to basic questions; followed basic commands w/ cues. Mumbled speech at baseline; low volume. Mild Confusion in behaviors -- suspect impact from ETOH abuse. Sister present in room at end of session.  On RA: afebrile. WBC wnl.    Pt appears to present w/ grossly functional oropharyngeal phase swallowing w/ No immediate pharyngeal phase dysphagia noted, No overt neuromuscular deficits noted. Pt's oral phase was grossly [facility] for bolus management and timely swallow of liquids and purees -- pt has POOR/MISSING Dentition status at baseline which impacts mastication of solids. Pt consumed po trials w/ No immediate, overt clinical s/s of aspiration during po trials.  HOWEVER, delayed congested coughing noted x2-3 intermittently b/t trials -- unsure if related to swallowing and impulsive drinking or to Baseline Phlegm has. Pt is Not on a PPI; suspect REFLUX d/t s/s w/ potential impact from ETOH abuse.  Pt appears at reduced risk for aspiration following general aspiration precautions, using a modified diet consistency for ease of oral phase mastication, and when given Supervision to lessen Impulsive drinking behaviors and provide cues during oral intake for follow through.   Pt does have challenging factors that could impact oropharyngeal swallowing to include declined Cognitive functioning/ETOH abuse baseline, fatigue/weakness s/p acute illness, week self-feeding abilities, and poor/missing Dentition. Also suspect potential min Esophageal phase  dysmotility/REFLUX in setting of belching w/ trials. These factors can increase risk for aspiration, dysphagia as well as decreased oral intake overall.    During po trials, pt consumed all consistencies w/ no immediate, overt coughing, decline in vocal quality, or change in respiratory presentation during/post trials; delayed throat clearing but no wet vocal quality or change in breathing. Oral phase appeared grossly [facility] w/ timely bolus management and control of bolus propulsion for A-P transfer for swallowing. Bolus mastication was impacted by Missing/Poor Dentition and required Time and moistening boluses cut Small. Oral clearing achieved w/ all trial consistencies.      Recommend continue a more Minced diet consistency, Level 2 w/ well-chopped meats, moistened foods(d/t Missing Dentition, Cognition); Thin liquids -- carefully monitor straw use, and pt should help to Hold Cup when drinking. Pinch straw to limit Impulsive drinking - small sips. Recommend general aspiration precautions, Pills CRUSHED vs WHOLE in Puree for safer, easier swallowing. Feeding support at meals d/t Cognitive status.  Education given on Pills in Puree; food consistencies and easy to eat options; general aspiration precautions to pt and Sister. NSG updated, agreed. MD updated. Recommend Dietician f/u for support.   ST services will f/u while admitted for ongoing assessment of swallowing, toleration of diet, and appropriateness to upgrade diet consistency.   Recommends f/u at next venue of care post continued resolution of ETOH Withdrawal and its s/s as well as post seizure activity for Cognitive-linguistic assessment to determine any needs targeting ADLs and Safety awareness.       HPI HPI: Pt is a 72 year old male with a past medical history significant for hypertension and alcohol abuse who presented to [facility] ED on 02/20/2022 after being found down and unresponsive by a friend at home. He had 3 seizures with  EMS during which  eyes were deviated to the right.  Patient was intubated and admitted into PCCM, neurology was consulted.  Patient has history of EtOH abuse, suspected withdrawal syndrome/delirium tremens.  Patient was extubated on 02/22/2022.  MRI: No acute intracranial abnormality.  2. Old left parietal lobe infarct and findings of chronic small  vessel disease.  CXR at admit: No acute abnormalities.      SLP Plan  Continue with current plan of care      Recommendations for follow up therapy are one component of a multi-disciplinary discharge planning process, led by the attending physician.  Recommendations may be updated based on patient status, additional functional criteria and insurance authorization.    Recommendations  Diet recommendations: Dysphagia 2 (fine chop);Thin liquid Liquids provided via: Cup;Straw (monitoring) Medication Administration: Whole meds with puree (vs CRUSHED in puree) Supervision: Patient able to self feed;Intermittent supervision to cue for compensatory strategies Compensations: Minimize environmental distractions;Slow rate;Small sips/bites;Lingual sweep for clearance of pocketing;Follow solids with liquid Postural Changes and/or Swallow Maneuvers: Out of bed for meals;Seated upright 90 degrees;Upright 30-60 min after meal (REFLUX precs)                General recommendations:  (GI f/u for management of REFLUX as needed; Palliative Care consult for [place]; ETOH counseling) Oral Care Recommendations: Oral care BID;Oral care before and after PO;Staff/trained caregiver to provide oral care Follow Up Recommendations: Follow physician's recommendations for discharge plan and follow up therapies Assistance recommended at discharge: Set up Supervision/Assistance SLP Visit Diagnosis: Dysphagia, oral phase (R13.11) (poor Dentition status baseline; Cognitive decline; ETOH abuse) Plan: Continue with current plan of care            John Spears, [place], [place]; [place] [phone] (ascom) John Spears  02/26/2022, 4:26 PM

## 2022-02-27 DIAGNOSIS — F1010 Alcohol abuse, uncomplicated: Secondary | ICD-10-CM | POA: Diagnosis not present

## 2022-02-27 DIAGNOSIS — R569 Unspecified convulsions: Secondary | ICD-10-CM | POA: Diagnosis not present

## 2022-02-27 DIAGNOSIS — I639 Cerebral infarction, unspecified: Secondary | ICD-10-CM | POA: Diagnosis not present

## 2022-02-27 DIAGNOSIS — G9340 Encephalopathy, unspecified: Secondary | ICD-10-CM | POA: Diagnosis not present

## 2022-02-27 LAB — GASTROINTESTINAL PANEL BY PCR, STOOL (REPLACES STOOL CULTURE)

## 2022-02-27 LAB — CBC
HCT: 26.2 % — ABNORMAL LOW (ref 39.0–52.0)
Hemoglobin: 8.7 g/dL — ABNORMAL LOW (ref 13.0–17.0)
MCH: 33.1 pg (ref 26.0–34.0)
MCHC: 33.2 g/dL (ref 30.0–36.0)
MCV: 99.6 fL (ref 80.0–100.0)
Platelets: 174 10*3/uL (ref 150–400)
RBC: 2.63 MIL/uL — ABNORMAL LOW (ref 4.22–5.81)
RDW: 17 % — ABNORMAL HIGH (ref 11.5–15.5)
WBC: 4.2 10*3/uL (ref 4.0–10.5)
nRBC: 0 % (ref 0.0–0.2)

## 2022-02-27 LAB — GLUCOSE, CAPILLARY
Glucose-Capillary: 100 mg/dL — ABNORMAL HIGH (ref 70–99)
Glucose-Capillary: 85 mg/dL (ref 70–99)
Glucose-Capillary: 85 mg/dL (ref 70–99)
Glucose-Capillary: 85 mg/dL (ref 70–99)
Glucose-Capillary: 89 mg/dL (ref 70–99)
Glucose-Capillary: 95 mg/dL (ref 70–99)

## 2022-02-27 LAB — BASIC METABOLIC PANEL
Anion gap: 8 (ref 5–15)
BUN: 12 mg/dL (ref 8–23)
CO2: 25 mmol/L (ref 22–32)
Calcium: 8.9 mg/dL (ref 8.9–10.3)
Chloride: 105 mmol/L (ref 98–111)
Creatinine, Ser: 0.8 mg/dL (ref 0.61–1.24)
GFR, Estimated: >60 mL/min (ref >60)
Glucose, Bld: 88 mg/dL (ref 70–99)
Potassium: 3.5 mmol/L (ref 3.5–5.1)
Sodium: 138 mmol/L (ref 135–145)

## 2022-02-27 LAB — MAGNESIUM: Magnesium: 1.5 mg/dL — ABNORMAL LOW (ref 1.7–2.4)

## 2022-02-27 LAB — PHOSPHORUS: Phosphorus: 3.4 mg/dL (ref 2.5–4.6)

## 2022-02-27 MED ORDER — POTASSIUM CHLORIDE CRYS ER 20 MEQ PO TBCR
20.0000 meq | EXTENDED_RELEASE_TABLET | Freq: Two times a day (BID) | ORAL | Status: DC
  Administered 2022-02-27 – 2022-03-03 (×8): 20 meq via ORAL
  Filled 2022-02-27 (×9): qty 1

## 2022-02-27 MED ORDER — POTASSIUM CHLORIDE CRYS ER 20 MEQ PO TBCR
40.0000 meq | EXTENDED_RELEASE_TABLET | Freq: Once | ORAL | Status: AC
  Administered 2022-02-27: 40 meq via ORAL
  Filled 2022-02-27: qty 2

## 2022-02-27 MED ORDER — CARVEDILOL 25 MG PO TABS
25.0000 mg | ORAL_TABLET | Freq: Two times a day (BID) | ORAL | Status: DC
  Administered 2022-02-27 – 2022-03-03 (×8): 25 mg via ORAL
  Filled 2022-02-27 (×8): qty 1

## 2022-02-27 MED ORDER — SPIRONOLACTONE 25 MG PO TABS
25.0000 mg | ORAL_TABLET | Freq: Every day | ORAL | Status: DC
  Administered 2022-02-27 – 2022-03-03 (×5): 25 mg via ORAL
  Filled 2022-02-27 (×5): qty 1

## 2022-02-27 MED ORDER — MAGNESIUM OXIDE -MG SUPPLEMENT 400 (240 MG) MG PO TABS
400.0000 mg | ORAL_TABLET | Freq: Two times a day (BID) | ORAL | Status: DC
  Administered 2022-02-27 – 2022-03-03 (×9): 400 mg via ORAL
  Filled 2022-02-27 (×9): qty 1

## 2022-02-27 MED ORDER — MAGNESIUM SULFATE 4 GM/100ML IV SOLN
4.0000 g | Freq: Once | INTRAVENOUS | Status: DC
  Filled 2022-02-27: qty 100

## 2022-02-27 NOTE — Progress Notes (Addendum)
Occupational Therapy Treatment Patient Details Name: John Spears MRN: [id number] DOB: 1950/10/23 Today's Date: 02/27/2022   History of present illness Pt is a 72 year old male with a past medical history significant for hypertension and alcohol abuse who presented to [facility] on 02/20/2022 after being found down and unresponsive.  MD assessment includes: aspiration pneumonia, mildly elevated troponin with suspected demand ischemia, lactic acidosis with recurrent seizures, and hyperglycemia.   OT comments  John Spears was oriented to time and place today, presented with a flat effect, had no recollection of the events that led to his hospitalization. He endorsed moderate head and neck pain. Pt was able to transfer from supine to sitting w/o physical assistance and with fluid movement. When he attempted to come into standing, however, he had extensive explosive diarrhea, soiling the bed, all linens, his clothing, b/l UE and LE, and his hands. Pt was able to come into standing w/ RW and was able to take off his gown, wash his arms, legs, and buttocks while standing, w/o UE support and w/ physical assist from therapist. Did need occasional cueing for task continuation, as well as Min A for peri-care for thoroughness. Pt able to don clean gown in standing. Therapist and NT changed bedding, cleaned floor, removed and replaced sacral pad. Pt transferred back into bed and repositioned self towards HOB in supine w/o physical assist from therapist. [facility] notified re: pt's diarrhea and head/neck pain. Pt's fxl mobility and standing/sitting balance are much improved this day, as is his mentation. Provided educ re: importance of alcohol use discontinuation and variety of community and rehab services potentially available. Continue to recommend DC to SNF to assist pt in returning to his physical PLOF.   Recommendations for follow up therapy are one component of a multi-disciplinary discharge planning process, led by the  attending physician.  Recommendations may be updated based on patient status, additional functional criteria and insurance authorization.    Follow Up Recommendations  Skilled nursing-short term rehab (<3 hours/day)     Assistance Recommended at Discharge Frequent or constant Supervision/Assistance  Patient can return home with the following  A little help with bathing/dressing/bathroom;A little help with walking and/or transfers;Assistance with cooking/housework;Assist for transportation   Equipment Recommendations       Recommendations for Other Services Other (comment) (referral/information re: alcohol cessation/rehab programs)    Precautions / Restrictions Precautions Precautions: Fall Precaution Comments: Seizure precautions Restrictions Weight Bearing Restrictions: No       Mobility Bed Mobility Overal bed mobility: Needs Assistance Bed Mobility: Supine to Sit, Sit to Supine     Supine to sit: Supervision Sit to supine: Supervision   General bed mobility comments: min verbal cues for sequencing but no physical assist needed    Transfers Overall transfer level: Needs assistance Equipment used: Rolling walker (2 wheels) Transfers: Sit to/from Stand, Bed to chair/wheelchair/BSC Sit to Stand: Supervision     Step pivot transfers: Supervision     General transfer comment: able to rise w/o direct assist from bed and from [facility]     Balance Overall balance assessment: Needs assistance Sitting-balance support: Feet supported, Single extremity supported Sitting balance-Leahy Scale: Good     Standing balance support: No upper extremity supported, During functional activity Standing balance-Leahy Scale: Good                             ADL either performed or assessed with clinical judgement   ADL Overall ADL's :  Needs assistance/impaired Eating/Feeding: Modified independent   Grooming: Wash/dry hands;Wash/dry face;Standing;Supervision/safety    Upper Body Bathing: Supervision/ safety;Standing   Lower Body Bathing: Supervison/ safety;Sit to/from stand   Upper Body Dressing : Supervision/safety;Standing           Toileting- Clothing Manipulation and Hygiene: Minimal assistance;Sit to/from stand Toileting - Clothing Manipulation Details (indicate cue type and reason): Pt attempts peri-care in standing, able to perform fairly well, but requires assistance for thoroughness            Extremity/Trunk Assessment Upper Extremity Assessment Upper Extremity Assessment: Overall WFL for tasks assessed   Lower Extremity Assessment Lower Extremity Assessment: Overall WFL for tasks assessed        Vision       Perception     Praxis      Cognition Arousal/Alertness: Awake/alert Behavior During Therapy: WFL for tasks assessed/performed, Flat affect Overall Cognitive Status: Within Functional Limits for tasks assessed                                 General Comments: Able to follow directions with extra time and cueing. A&O x 4        Exercises Other Exercises Other Exercises: Educ re: home safety, alcohol cessation, DC recs; Clean-up w/ pt in standing following diarrhea    Shoulder Instructions       General Comments      Pertinent Vitals/ Pain       Pain Assessment Faces Pain Scale: Hurts little more Pain Location: headache, neck pain Pain Intervention(s): Repositioned  Home Living                                          Prior Functioning/Environment              Frequency  Min 2X/week        Progress Toward Goals  OT Goals(current goals can now be found in the care plan section)  Progress towards OT goals: Progressing toward goals  Acute Rehab OT Goals Patient Stated Goal: to stop drinking OT Goal Formulation: With patient Time For Goal Achievement: 03/10/22 Potential to Achieve Goals: Fair  Plan Discharge plan remains appropriate;Frequency remains  appropriate    Co-evaluation                 AM-PAC OT "6 Clicks" Daily Activity     Outcome Measure   Help from another person eating meals?: None Help from another person taking care of personal grooming?: A Little Help from another person toileting, which includes using toliet, bedpan, or urinal?: A Lot Help from another person bathing (including washing, rinsing, drying)?: A Little Help from another person to put on and taking off regular upper body clothing?: A Little Help from another person to put on and taking off regular lower body clothing?: A Little 6 Click Score: 18    End of Session Equipment Utilized During Treatment: Rolling walker (2 wheels)  OT Visit Diagnosis: Unsteadiness on feet (R26.81);Muscle weakness (generalized) (M62.81)   Activity Tolerance Patient tolerated treatment well   Patient Left in bed;with call bell/phone within reach;with nursing/sitter in room   Nurse Communication Mobility status;Other (comment) (pt w/ diarrhea)        Time: 7829-5621 OT Time Calculation (min): 45 min  Charges: OT General Charges $OT Visit: 1 Visit  OT Treatments $Self Care/Home Management : 38-52 mins Josiah Lobo, PhD, MS, OTR/L 02/27/22, 12:51 PM

## 2022-02-27 NOTE — Progress Notes (Signed)
[facility] Progress Note  Patient: John Spears    ZOX:[id number]  DOA: 02/20/2022     Date of Service: the patient was seen and examined on 02/27/2022  Chief Complaint  Patient presents with   Seizures   Brief hospital course: [name] is a 72 year old male with a past medical history significant for hypertension and alcohol abuse who presented to [facility] ED on 02/20/2022 after being found down and unresponsive by a friend at home.  Patient was intubated and admitted into PCCM, neurology was consulted.  Patient has history of EtOH abuse, suspected withdrawal syndrome/delirium tremens.  Patient was extubated on 02/22/2022.  Patient was stabilized in the ICU and downgraded under hospitalist care on 02/24/22   Assessment and Plan:  # Aspiration pneumonia --on nasal canula  12/29 s/p unasyn and on 1/3 started Augmentin , Abx for 7 days total SLP eval done, started diet  Blood culture negative    #Mildly elevated Troponin, suspect demand ischemia Systolic CHF, unknown acuity, POA PMHx: Hypertension -Continuous cardiac monitoring -Maintain MAP >65 -s/p IV fluids and S/p Vasopressors, lactic acid was trended  -Troponin peaked 291, trending down. TTE shows LVEF 30 to 35%, LV moderately decreased function, no regional wall motion abnormality, moderate LV hypertrophy of the basal septal segment.  Moderately elevated pulmonary artery systolic pressure RVSP 52 mmHg Cardiology consulted, recommended GMT, increased Coreg 12.5 mg p.o. twice daily, started Entresto-51 mg p.o. twice daily, Lasix 40 mg p.o. twice daily, discontinued losartan and amlodipine.  Patient will need workup possible cardiac cath as an outpatient when stable.  Recommend to follow with cardiology as an outpatient.  Continue fluid restriction 1.5 L/day, start diuretics if volume overload.   # Hypokalemia secondary to diarrhea, developed on at night on 1/2 Potassium repleted. Monitor and replete as needed GI pathogen  negative, no fever or leukocytosis, less likely C. Difficile  Hypomagnesemia, started magnesium oxide 400 mg p.o. twice daily for 5 days  #AG Metabolic Acidosis, Resolved  #Lactic Acidosis, suspect due to recurrent seizures -Monitor I&O's / urinary output -Follow BMP -Ensure adequate renal perfusion -Avoid nephrotoxic agents as able -Replace electrolytes as indicated -s/p IV fluids   #Hyperglycemia -CBG's q4h; Target range of 140 to 180 -SSI -Follow ICU Hypo/Hyperglycemia protocol   Seizure disorder S/p Keppra 1 g IV q. 12 hourly, transition to oral  Continue fall precaution and seizure precautions Follow neurology   #Delerium Tremens - RESOLVED  -S/P FULL SCOPE OF THERAPY  WITH SEDATION WHILE ON VENTILATOR   Iron deficiency anemia, transferrin saturation 13%, we will start oral iron supplement.  Patient is able to swallow  Vitamin D deficiency: started vitamin D 50,000 units p.o. weekly, follow with PCP to repeat vitamin D level after 3 to 6 months. Vitamin B12 level 304, target >400, started vitamin B12 oral supplement.  Body mass index is 24.26 kg/m.  Interventions:       Diet: Dysphagia 2 diet DVT Prophylaxis: Subcutaneous Heparin    Advance goals of care discussion: Full code  Family Communication: family was NOT present at bedside, at the time of interview.  The pt provided permission to discuss medical plan with the family. Opportunity was given to ask question and all questions were answered satisfactorily.   Disposition:  Pt is from Home, admitted with seizures and delirium tremens s/p intubation, aspiration pneumonia, on IV antibiotics, which precludes a safe discharge. Discharge to SNF, when bed will be available.  Clinically stable.    Subjective: No significant overnight events, patient  is more alert and awake, AAO x 3, patient stated that he is having mild headache, no any other active issues.  Patient did work with PT and was able to stand up with  support.  Physical Exam: General:  alert and awake, AAOx 3 Appear in no distress, affect flat, depressed  Eyes: PERRLA ENT: Oral Mucosa Clear, moist  Neck: NO JVD,  Cardiovascular: S1 and S2 Present, NO Murmur,  Respiratory: good respiratory effort, Bilateral Air entry equal and Decreased, mild Crackles, no wheezes Abdomen: Bowel Sound present, Soft and no tenderness,  Skin: no rashes Extremities: no Pedal edema, no calf tenderness Neurologic: without any new focal findings Gait not checked due to patient safety concerns  Vitals:   02/27/22 0735 02/27/22 0901 02/27/22 1138 02/27/22 1601  BP: (!) 171/85 (!) 149/95 (!) 152/81 137/73  Pulse: 72 67 70 79  Resp:   18 18  Temp:   97.9 F (36.6 C) 98.5 F (36.9 C)  TempSrc:      SpO2:   100% 100%  Weight:      Height:        Intake/Output Summary (Last 24 hours) at 02/27/2022 1619 Last data filed at 02/27/2022 0840 Gross per 24 hour  Intake --  Output 1600 ml  Net -1600 ml   Filed Weights   02/24/22 0410 02/25/22 0500 02/26/22 0500  Weight: 78.9 kg 83.4 kg 81 kg    Data Reviewed: I have personally reviewed and interpreted daily labs, tele strips, imagings as discussed above. I reviewed all nursing notes, pharmacy notes, vitals, pertinent old records I have discussed plan of care as described above with RN and patient/family.  CBC: Recent Labs  Lab 02/23/22 0431 02/24/22 0426 02/25/22 0542 02/26/22 0628 02/27/22 0635  WBC 6.6 8.5 7.8 4.1 4.2  HGB 9.7* 9.0* 8.2* 8.0* 8.7*  HCT 28.7* 27.5* 24.6* 24.5* 26.2*  MCV 100.0 101.5* 101.2* 100.4* 99.6  PLT 149* 136* 129* 150 176   Basic Metabolic Panel: Recent Labs  Lab 02/23/22 0431 02/24/22 0426 02/25/22 0542 02/26/22 0628 02/27/22 0635  NA 142 142 139 138 138  K 3.2* 3.0* 2.9* 3.8 3.5  CL 110 104 107 108 105  CO2 22 24 24 24 25   GLUCOSE 95 95 118* 90 88  BUN 11 13 17 13 12   CREATININE 0.93 1.09 1.03 0.83 0.80  CALCIUM 8.7* 8.7* 8.5* 8.7* 8.9  MG 1.8 1.7 1.7  2.0 1.5*  PHOS 4.1 3.4 2.7 2.8 3.4    Studies: No results found.  Scheduled Meds:  amoxicillin-clavulanate  1 tablet Oral Q12H   atorvastatin  40 mg Oral Daily   carvedilol  25 mg Oral BID WC   Chlorhexidine Gluconate Cloth  6 each Topical Daily   vitamin B-12  1,000 mcg Oral Daily   famotidine  20 mg Oral QHS   folic acid  1 mg Oral Daily   furosemide  40 mg Oral BID   guaiFENesin  600 mg Oral BID   heparin  5,000 Units Subcutaneous Q8H   iron polysaccharides  150 mg Oral Daily   levETIRAcetam  1,000 mg Oral BID   LORazepam  1 mg Intravenous Once   magnesium oxide  400 mg Oral BID   multivitamin with minerals  1 tablet Oral Daily   pantoprazole  40 mg Oral Daily   potassium chloride  20 mEq Oral BID   sacubitril-valsartan  1 tablet Oral BID   spironolactone  25 mg Oral Daily   thiamine  100 mg Oral Daily   Vitamin D (Ergocalciferol)  50,000 Units Oral Q7 days   Continuous Infusions:   PRN Meds: acetaminophen, bisacodyl, bisacodyl, ibuprofen, metoprolol tartrate, polyethylene glycol  Time spent: 40 minutes  Author: Gillis Santa. MD [facility] 02/27/2022 4:19 PM  To reach On-call, see care teams to locate the attending and reach out to them via [email or web address]. If 7PM-7AM, please contact night-coverage If you still have difficulty reaching the attending provider, please page the [facility] (Director on Call) for [facility] on amion for assistance.

## 2022-02-27 NOTE — Progress Notes (Signed)
Physical Therapy Treatment Patient Details Name: John Spears MRN: [id number] DOB: Dec 25, 1950 Today's Date: 02/27/2022   History of Present Illness Pt is a 72 year old male with a past medical history significant for hypertension and alcohol abuse who presented to [facility] ED on 02/20/2022 after being found down and unresponsive.  MD assessment includes: aspiration pneumonia, mildly elevated troponin with suspected demand ischemia, lactic acidosis with recurrent seizures, and hyperglycemia.    PT Comments    Pt was pleasant and put forth good effort throughout the session. Pt continued to make very good progress towards goals with noted improvements in function as well as in cognition and verbal communication this session.  Pt was able to amb 2 x 30 feet with grossly improved cadence and step length with no adverse symptoms noted and with HR and SpO2 WNL on room air.  Pt participated in static unsupported balance training with reaching activities and was fairly stable with feet apart and together but did present with min instability in semi-tandem requiring min A to prevent LOB.  Pt will benefit from PT services in a SNF setting upon discharge to safely address deficits listed in patient problem list for decreased caregiver assistance and eventual return to PLOF.     Recommendations for follow up therapy are one component of a multi-disciplinary discharge planning process, led by the attending physician.  Recommendations may be updated based on patient status, additional functional criteria and insurance authorization.  Follow Up Recommendations  Skilled nursing-short term rehab (<3 hours/day) Can patient physically be transported by private vehicle: Yes   Assistance Recommended at Discharge Frequent or constant Supervision/Assistance  Patient can return home with the following Assistance with cooking/housework;Direct supervision/assist for medications management;Direct supervision/assist for  financial management;Assist for transportation;Help with stairs or ramp for entrance;A little help with walking and/or transfers;A little help with bathing/dressing/bathroom   Equipment Recommendations  Other (comment) (TBD)    Recommendations for Other Services       Precautions / Restrictions Precautions Precautions: Fall Precaution Comments: Seizure precautions Restrictions Weight Bearing Restrictions: No Other Position/Activity Restrictions: Seizure precautions     Mobility  Bed Mobility Overal bed mobility: Needs Assistance       Supine to sit: Modified independent (Device/Increase time) Sit to supine: Modified independent (Device/Increase time)   General bed mobility comments: Min extra time and effort only    Transfers Overall transfer level: Needs assistance Equipment used: Rolling walker (2 wheels)   Sit to Stand: Supervision           General transfer comment: Fair eccentric and concentric control and stability with UE support/assist required    Ambulation/Gait Ambulation/Gait assistance: Min guard Gait Distance (Feet): 30 Feet x 2 Assistive device: Rolling walker (2 wheels) Gait Pattern/deviations: Step-through pattern, Decreased step length - right, Decreased step length - left, Trunk flexed Gait velocity: decreased     General Gait Details: Slow cadence with short B step length but steady without LOB   Stairs             Wheelchair Mobility    Modified Rankin (Stroke Patients Only)       Balance Overall balance assessment: Needs assistance Sitting-balance support: Feet supported, Single extremity supported Sitting balance-Leahy Scale: Good     Standing balance support: During functional activity, Bilateral upper extremity supported, No upper extremity supported Standing balance-Leahy Scale: Good  Cognition Arousal/Alertness: Awake/alert Behavior During Therapy: Flat affect Overall  Cognitive Status: Within Functional Limits for tasks assessed                                 General Comments: Continued improvement in affect and verbal engagement        Exercises Total Joint Exercises Ankle Circles/Pumps: Strengthening, Both, 10 reps, AROM Quad Sets: Strengthening, Both, 10 reps Gluteal Sets: Strengthening, Both, 10 reps Long Arc Quad: Strengthening, Both, 10 reps, 15 reps Knee Flexion: Strengthening, Both, 10 reps, 15 reps Marching in Standing: Strengthening, Both, 5 reps, Standing Other Exercises Other Exercises: Dynamic standing balance training without UE support, feet apart, together, and semi-tandem with reaching outside BOS    General Comments        Pertinent Vitals/Pain Pain Assessment Pain Assessment: No/denies pain    Home Living                          Prior Function            PT Goals (current goals can now be found in the care plan section) Progress towards PT goals: Progressing toward goals    Frequency    Min 2X/week      PT Plan Current plan remains appropriate    Co-evaluation              AM-PAC PT "6 Clicks" Mobility   Outcome Measure  Help needed turning from your back to your side while in a flat bed without using bedrails?: None Help needed moving from lying on your back to sitting on the side of a flat bed without using bedrails?: None Help needed moving to and from a bed to a chair (including a wheelchair)?: A Little Help needed standing up from a chair using your arms (e.g., wheelchair or bedside chair)?: A Little Help needed to walk in hospital room?: A Little Help needed climbing 3-5 steps with a railing? : A Lot 6 Click Score: 19    End of Session Equipment Utilized During Treatment: Gait belt Activity Tolerance: Patient tolerated treatment well Patient left: in bed;with call bell/phone within reach;with bed alarm set;Other (comment) (Seizure precaution mats on bed  rails) Nurse Communication: Mobility status PT Visit Diagnosis: Unsteadiness on feet (R26.81);Difficulty in walking, not elsewhere classified (R26.2);Muscle weakness (generalized) (M62.81)     Time: 8299-3716 PT Time Calculation (min) (ACUTE ONLY): 24 min  Charges:  $Gait Training: 8-22 mins $Therapeutic Exercise: 8-22 mins                     D. Scott Dujuan Stankowski PT, DPT 02/27/22, 4:06 PM

## 2022-02-27 NOTE — Progress Notes (Signed)
Rounding Note    Patient Name: John Spears Date of Encounter: 02/27/2022  [facility] Cardiologist: Nelva Bush, MD   Subjective   Lying supine in bed, no complaints Reports making good urine Started on p.o. Lasix twice daily yesterday as he had no IV access  Inpatient Medications    Scheduled Meds:  amoxicillin-clavulanate  1 tablet Oral Q12H   atorvastatin  40 mg Oral Daily   carvedilol  12.5 mg Oral BID WC   Chlorhexidine Gluconate Cloth  6 each Topical Daily   vitamin B-12  1,000 mcg Oral Daily   famotidine  20 mg Oral QHS   folic acid  1 mg Oral Daily   furosemide  40 mg Oral BID   guaiFENesin  600 mg Oral BID   heparin  5,000 Units Subcutaneous Q8H   iron polysaccharides  150 mg Oral Daily   levETIRAcetam  1,000 mg Oral BID   LORazepam  1 mg Intravenous Once   magnesium oxide  400 mg Oral BID   multivitamin with minerals  1 tablet Oral Daily   pantoprazole  40 mg Oral Daily   sacubitril-valsartan  1 tablet Oral BID   thiamine  100 mg Oral Daily   Vitamin D (Ergocalciferol)  50,000 Units Oral Q7 days   Continuous Infusions:  PRN Meds: acetaminophen, bisacodyl, bisacodyl, ibuprofen, metoprolol tartrate, polyethylene glycol   Vital Signs    Vitals:   02/27/22 0730 02/27/22 0735 02/27/22 0901 02/27/22 1138  BP: (!) 181/87 (!) 171/85 (!) 149/95 (!) 152/81  Pulse: 78 72 67 70  Resp:    18  Temp:    97.9 F (36.6 C)  TempSrc:      SpO2:    100%  Weight:      Height:        Intake/Output Summary (Last 24 hours) at 02/27/2022 1224 Last data filed at 02/27/2022 0840 Gross per 24 hour  Intake --  Output 1800 ml  Net -1800 ml      02/26/2022    5:00 AM 02/25/2022    5:00 AM 02/24/2022    4:10 AM  Last 3 Weights  Weight (lbs) 178 lb 9.2 oz 183 lb 13.8 oz 173 lb 15.1 oz  Weight (kg) 81 kg 83.4 kg 78.9 kg      Telemetry    NSR with PACs - Personally Reviewed  ECG     - Personally Reviewed  Physical Exam   GEN: No acute distress.    Neck: Unable to estimate JVD Cardiac: RRR, no murmurs, rubs, or gallops.  Respiratory: Clear to auscultation bilaterally.  Scattered Rales GI: Soft, nontender, non-distended  MS: No edema; No deformity. Neuro:  Nonfocal  Psych: Normal affect   Labs    High Sensitivity Troponin:   Recent Labs  Lab 02/20/22 1747 02/20/22 2039 02/20/22 2245 02/21/22 1033 02/21/22 1258  TROPONINIHS 124* 188* 291* 268* 258*     Chemistry Recent Labs  Lab 02/20/22 1547 02/20/22 2039 02/22/22 0434 02/23/22 0431 02/24/22 0426 02/25/22 0542 02/26/22 0628 02/27/22 0635  NA 142   < > 141 142 142 139 138 138  K 4.2   < > 3.7 3.2* 3.0* 2.9* 3.8 3.5  CL 106   < > 110 110 104 107 108 105  CO2 16*   < > 24 22 24 24 24 25   GLUCOSE 163*   < > 99 95 95 118* 90 88  BUN 11   < > 12 11 13 17  13  12  CREATININE 1.10   < > 0.98 0.93 1.09 1.03 0.83 0.80  CALCIUM 9.3   < > 8.6* 8.7* 8.7* 8.5* 8.7* 8.9  MG  --    < >  --  1.8 1.7 1.7 2.0 1.5*  PROT 6.2*  --   --   --   --   --   --   --   ALBUMIN 3.4*   < > 2.5* 2.6* 2.5*  --   --   --   AST 69*  --   --   --   --   --   --   --   ALT 20  --   --   --   --   --   --   --   ALKPHOS 63  --   --   --   --   --   --   --   BILITOT 0.8  --   --   --   --   --   --   --   GFRNONAA >60   < > >60 >60 >60 >60 >60 >60  ANIONGAP 20*   < > 7 10 14 8 6 8    < > = values in this interval not displayed.    Lipids No results for input(s): "CHOL", "TRIG", "HDL", "LABVLDL", "LDLCALC", "CHOLHDL" in the last 168 hours.  Hematology Recent Labs  Lab 02/25/22 0542 02/26/22 0628 02/27/22 0635  WBC 7.8 4.1 4.2  RBC 2.43* 2.44* 2.63*  HGB 8.2* 8.0* 8.7*  HCT 24.6* 24.5* 26.2*  MCV 101.2* 100.4* 99.6  MCH 33.7 32.8 33.1  MCHC 33.3 32.7 33.2  RDW 17.6* 17.3* 17.0*  PLT 129* 150 174   Thyroid No results for input(s): "TSH", "FREET4" in the last 168 hours.  BNP Recent Labs  Lab 02/23/22 1441  BNP 1,539.0*    DDimer No results for input(s): "DDIMER" in the last 168  hours.   Radiology    No results found.  Cardiac Studies  Echo  1. Left ventricular ejection fraction, by estimation, is 30 to 35%. The  left ventricle has moderately decreased function. The left ventricle  demonstrates regional wall motion abnormalities (see scoring  diagram/findings for description). There is  moderate asymmetric left ventricular hypertrophy of the basal-septal  segment. Left ventricular diastolic parameters are indeterminate.   2. Right ventricular systolic function is normal. The right ventricular  size is normal. There is moderately elevated pulmonary artery systolic  pressure. The estimated right ventricular systolic pressure is 32.2 mmHg.   3. The mitral valve is normal in structure. Trivial mitral valve  regurgitation.   4. The aortic valve is tricuspid. Aortic valve regurgitation is not  visualized. No aortic stenosis is present.   5. The inferior vena cava is normal in size with <50% respiratory  variability, suggesting right atrial pressure of 8 mmHg.    Patient Profile     72 y/o man with history of HTN and EtOH abuse, whom we have been asked to see b/c of abnormal echocardiogram. Patient was admitted on 02/20/2022 after being found unresponsive by a friend. He was initially intubated and required vasopressor support. There was concern that he may have had several seizures prior to arrival. The patient does not recall any events leading up to his hospitalization. He denies chest pain, shortness of breath, and lightheadedness. He denies having seen a heart doctor. EKG's during the ICU stay showed development of anterolateral T wave inversions and  marked QT prolongation in he setting of EtOH withdrawal, aspiration PNA, and metabolic acidosis.   Assessment & Plan    A/P: Cardiomyopathy/acute systolic CHF Ejection fraction 30 to 35%, BNP 1500 Initially requiring intubation for respiratory failure, extubated December 31 Echocardiogram with moderately  elevated right heart pressures, dilated IVC -Continue Entresto, carvedilol (will increase dose of carvedilol up to 25 twice daily).  If blood pressure continues to run high, may need to increase Entresto dosing Continue Lasix 40 twice daily, stable renal function Replete potassium, + lactone   Demand ischemia, troponin 268 Likely supply/demand mismatch in the setting of respiratory failure echocardiogram documenting ejection fraction 30 to 35% On beta-blocker,  statin In the setting of alcohol encephalopathy, not a good candidate for ischemic workup.  Would continue to have him follow-up in the clinic, further workup depending on medication and office compliance   Aspiration pneumonia Unresponsive on arrival, intubated for respiratory failure, now extubated On antibiotics   Essential hypertension ARB transition to Entresto, higher dose carvedilol as above Will try to keep regiment simple given history of medication noncompliance Add spironolactone today higher dose Entresto over the weekend   Alcohol abuse CIWA protocol per internal medicine Vitamin supplementation Daughters report confusion over the past 3 months, concerning for encephalopathy     Total encounter time more than 35 minutes  Greater than 50% was spent in counseling and coordination of care with the patient   For questions or updates, please contact [place] Please consult [email or web address] for contact info under        Signed, Ida Rogue, MD  02/27/2022, 12:24 PM

## 2022-02-28 DIAGNOSIS — I420 Dilated cardiomyopathy: Secondary | ICD-10-CM | POA: Diagnosis not present

## 2022-02-28 DIAGNOSIS — R7989 Other specified abnormal findings of blood chemistry: Secondary | ICD-10-CM | POA: Diagnosis not present

## 2022-02-28 DIAGNOSIS — I5021 Acute systolic (congestive) heart failure: Secondary | ICD-10-CM | POA: Diagnosis not present

## 2022-02-28 DIAGNOSIS — R569 Unspecified convulsions: Secondary | ICD-10-CM | POA: Diagnosis not present

## 2022-02-28 LAB — GLUCOSE, CAPILLARY
Glucose-Capillary: 105 mg/dL — ABNORMAL HIGH (ref 70–99)
Glucose-Capillary: 107 mg/dL — ABNORMAL HIGH (ref 70–99)
Glucose-Capillary: 110 mg/dL — ABNORMAL HIGH (ref 70–99)
Glucose-Capillary: 80 mg/dL (ref 70–99)
Glucose-Capillary: 87 mg/dL (ref 70–99)
Glucose-Capillary: 89 mg/dL (ref 70–99)
Glucose-Capillary: 95 mg/dL (ref 70–99)

## 2022-02-28 LAB — BASIC METABOLIC PANEL
Anion gap: 8 (ref 5–15)
BUN: 11 mg/dL (ref 8–23)
CO2: 25 mmol/L (ref 22–32)
Calcium: 9 mg/dL (ref 8.9–10.3)
Chloride: 107 mmol/L (ref 98–111)
Creatinine, Ser: 0.99 mg/dL (ref 0.61–1.24)
GFR, Estimated: >60 mL/min (ref >60)
Glucose, Bld: 102 mg/dL — ABNORMAL HIGH (ref 70–99)
Potassium: 3.7 mmol/L (ref 3.5–5.1)
Sodium: 140 mmol/L (ref 135–145)

## 2022-02-28 LAB — CBC
HCT: 26.8 % — ABNORMAL LOW (ref 39.0–52.0)
Hemoglobin: 9 g/dL — ABNORMAL LOW (ref 13.0–17.0)
MCH: 33.3 pg (ref 26.0–34.0)
MCHC: 33.6 g/dL (ref 30.0–36.0)
MCV: 99.3 fL (ref 80.0–100.0)
Platelets: 190 10*3/uL (ref 150–400)
RBC: 2.7 MIL/uL — ABNORMAL LOW (ref 4.22–5.81)
RDW: 17 % — ABNORMAL HIGH (ref 11.5–15.5)
WBC: 4.6 10*3/uL (ref 4.0–10.5)
nRBC: 0 % (ref 0.0–0.2)

## 2022-02-28 MED ORDER — ASPIRIN 81 MG PO CHEW
81.0000 mg | CHEWABLE_TABLET | Freq: Every day | ORAL | Status: DC
  Administered 2022-02-28 – 2022-03-03 (×4): 81 mg via ORAL
  Filled 2022-02-28 (×4): qty 1

## 2022-02-28 NOTE — Progress Notes (Addendum)
Cardiology Progress Note   Patient Name: John Spears Date of Encounter: 02/28/2022  Primary Cardiologist: Yvonne Nthony, MD  Subjective   Sitting up at bedside this AM.  More interactive.  No c/p or sob.  Just wants a shave.  Inpatient Medications    Scheduled Meds:  amoxicillin-clavulanate  1 tablet Oral Q12H   atorvastatin  40 mg Oral Daily   carvedilol  25 mg Oral BID WC   Chlorhexidine Gluconate Cloth  6 each Topical Daily   vitamin B-12  1,000 mcg Oral Daily   famotidine  20 mg Oral QHS   folic acid  1 mg Oral Daily   furosemide  40 mg Oral BID   guaiFENesin  600 mg Oral BID   heparin  5,000 Units Subcutaneous Q8H   iron polysaccharides  150 mg Oral Daily   levETIRAcetam  1,000 mg Oral BID   LORazepam  1 mg Intravenous Once   magnesium oxide  400 mg Oral BID   multivitamin with minerals  1 tablet Oral Daily   pantoprazole  40 mg Oral Daily   potassium chloride  20 mEq Oral BID   sacubitril-valsartan  1 tablet Oral BID   spironolactone  25 mg Oral Daily   thiamine  100 mg Oral Daily   Vitamin D (Ergocalciferol)  50,000 Units Oral Q7 days   Continuous Infusions:  PRN Meds: acetaminophen, bisacodyl, bisacodyl, ibuprofen, metoprolol tartrate, polyethylene glycol   Vital Signs    Vitals:   02/27/22 2029 02/28/22 0007 02/28/22 0513 02/28/22 0804  BP: 121/77 134/83 (!) 142/78 128/82  Pulse: 78 84 72 78  Resp: 18 16 16 16   Temp: 98 F (36.7 C) 98.2 F (36.8 C) 98.5 F (36.9 C) 98 F (36.7 C)  TempSrc:      SpO2: 99% 100% 100% 100%  Weight:      Height:        Intake/Output Summary (Last 24 hours) at 02/28/2022 1054 Last data filed at 02/28/2022 0400 Gross per 24 hour  Intake 60 ml  Output 1000 ml  Net -940 ml   Filed Weights   02/24/22 0410 02/25/22 0500 02/26/22 0500  Weight: 78.9 kg 83.4 kg 81 kg    Physical Exam   GEN: Well nourished, well developed, in no acute distress.  HEENT: Grossly normal.  Neck: Supple, no JVD, carotid bruits, or  masses. Cardiac: Irreg, no murmurs, rubs, or gallops. No clubbing, cyanosis, edema.  Radials 2+, DP/PT 2+ and equal bilaterally.  Respiratory:  Respirations regular and unlabored, clear to auscultation bilaterally. GI: Soft, nontender, nondistended, BS + x 4. MS: no deformity or atrophy. Skin: warm and dry, no rash. Neuro:  Strength and sensation are intact. Psych: AAOx3.  Normal affect.  Labs    Chemistry Recent Labs  Lab 02/22/22 0434 02/23/22 0431 02/24/22 0426 02/25/22 0542 02/26/22 0628 02/27/22 0635 02/28/22 0501  NA 141 142 142   < > 138 138 140  K 3.7 3.2* 3.0*   < > 3.8 3.5 3.7  CL 110 110 104   < > 108 105 107  CO2 24 22 24    < > 24 25 25   GLUCOSE 99 95 95   < > 90 88 102*  BUN 12 11 13    < > 13 12 11   CREATININE 0.98 0.93 1.09   < > 0.83 0.80 0.99  CALCIUM 8.6* 8.7* 8.7*   < > 8.7* 8.9 9.0  ALBUMIN 2.5* 2.6* 2.5*  --   --   --   --  GFRNONAA >60 >60 >60   < > >60 >60 >60  ANIONGAP 7 10 14    < > 6 8 8    < > = values in this interval not displayed.     Hematology Recent Labs  Lab 02/26/22 0628 02/27/22 0635 02/28/22 0501  WBC 4.1 4.2 4.6  RBC 2.44* 2.63* 2.70*  HGB 8.0* 8.7* 9.0*  HCT 24.5* 26.2* 26.8*  MCV 100.4* 99.6 99.3  MCH 32.8 33.1 33.3  MCHC 32.7 33.2 33.6  RDW 17.3* 17.0* 17.0*  PLT 150 174 190    Cardiac Enzymes  Recent Labs  Lab 02/20/22 1747 02/20/22 2039 02/20/22 2245 02/21/22 1033 02/21/22 1258  TROPONINIHS 124* 188* 291* 268* 258*      BNP    Component Value Date/Time   BNP 1,539.0 (H) 02/23/2022 1441   Lipids  Lab Results  Component Value Date   CHOL 254 (H) 03/08/2019   HDL 111 03/08/2019   LDLCALC 133 (H) 03/08/2019   TRIG 51 03/08/2019   CHOLHDL 2.3 03/08/2019    HbA1c  Lab Results  Component Value Date   HGBA1C 5.0 02/21/2022    Radiology    No results found.  Telemetry    RSR, PACs - Personally Reviewed  Cardiac Studies   2D Echocardiogram 1.2.2024   1. Left ventricular ejection fraction, by  estimation, is 30 to 35%. The  left ventricle has moderately decreased function. The left ventricle  demonstrates regional wall motion abnormalities (see scoring  diagram/findings for description). There is  moderate asymmetric left ventricular hypertrophy of the basal-septal  segment. Left ventricular diastolic parameters are indeterminate.   2. Right ventricular systolic function is normal. The right ventricular  size is normal. There is moderately elevated pulmonary artery systolic  pressure. The estimated right ventricular systolic pressure is 35.4 mmHg.   3. The mitral valve is normal in structure. Trivial mitral valve  regurgitation.   4. The aortic valve is tricuspid. Aortic valve regurgitation is not  visualized. No aortic stenosis is present.   5. The inferior vena cava is normal in size with <50% respiratory  variability, suggesting right atrial pressure of 8 mmHg.    Patient Profile     72 y.o. male ? w/a h/o HTN and ETOH abuse, who was admitted 12/29 after being found down @ home by a friend. Sz activity reported by EMS. Admitted by CCM w/ VDRF (extub 12/31), AMS, sz, etoh w/d, asp PNA, and metabolic/lactic acidosis.  HsTrop up to 268.  Echo w/ new LV dysfxn - 30-35% w/ mod asymm hypertrophy of basal septum, and mid-apical anterior/anterolateral HK.  Nl RV fxn, and RVSP 52.1 mmHg.   Assessment & Plan    1.  Cardiomyopathy/Acute HFrEF:  presented after being found unresponsive by friend w/ sz activity reported by EMS.  Lactic acidosis in ED w/ resp failure req intubation  Extubated 12/31.  HsTrop up to 268.  Echo w/ new LV dysfxn - 30-35% w/ mod asymm hypertrophy of basal septum, and mid-apical anterior/anterolateral HK.  Nl RV fxn, and RVSP 52.1 mmHg.  ? blocker and ARB added 1/3 and transitioned to entresto 1/4.  Arlyce Harman and PO lasix added.  Minus 1.5L yesterday and minus 453 ml since admission.  No wt in 2 days.  Feels well this AM.  No c/p or dyspnea.  Euvolemic on exam.  Renal  fxn/bp stable. Likely poor candidate for SGLT2i in setting of obesity, limited activity, and noncompliance.  2.  Demand Ischemia:  As above, new  LV dysfxn w/ HsTrop up to 268.  Denies c/p but his sister prev told us that he c/o left chest/axillary plan for several months @ home.  CT Abd/pelvix 09/2021 w/ atherosclerotic Ca2+ of abd Ao and branch vessels.  Currently a poor candidate for ischemic eval but following ongoing recovery from encephalopathy, we can consider myoview vs cor CTA in the outpt setting.  Cont ? blocker, statin.  Asa not started due to anemia, which has been stable.  3.  Aspiration PNA:  In setting of szs/unresponsiveness on arrival.  Abx per IM.  4.  Essential HTN:  BP significantly improved following transition to entresto and addition of spiro.  5.  Hypokalemia:  better on spiro.  6.  Sz d/o:  keppra per IM.  7.  Polysubstance abuse:  UDS + cannabinoid and benzos. Pt also w/ h/o ETOH use. CIWA per IM.   8.  Macrocytic anemia:  in setting of etoh use.  Stable  Improved.  Signed, Nicolasa Ducking, NP  02/28/2022, 10:54 AM    For questions or updates, please contact   Please consult [email or web address] for contact info under Cardiology/STEMI.

## 2022-02-28 NOTE — Progress Notes (Signed)
Speech Language Pathology Treatment: Dysphagia  Patient Details Name: John Spears MRN: [id number] DOB: [birth year] Today's Date: 02/28/2022 Time: 1225-1300 SLP Time Calculation (min) (ACUTE ONLY): 35 min  Assessment / Plan / Recommendation Clinical Impression  Pt seen today for ongoing f/u w/ toleration of diet; assessment of swallowing. Pt sitting upright on EOB at end of bed; NSG alerted. He still requires MIN-MOD support and cues for follow through w/ tasks. Pt was verbal and responded to basic questions; followed basic commands w/ cues. Mumbled speech at baseline; low volume. Mild Confusion in behaviors -- suspect impact from ETOH abuse. Sister present in room at end of session.  On RA: afebrile. WBC wnl.    Pt appears to present w/ grossly functional oropharyngeal phase swallowing using a modified diet consistency in setting of lacking sufficient Dentition w/ No immediate pharyngeal phase dysphagia noted, No overt neuromuscular deficits noted. Pt's oral phase was grossly [facility] for bolus management and timely swallow of liquids and purees -- pt has POOR/MISSING Dentition status at baseline which impacts mastication of solids. Pt consumed po trials w/ No immediate, overt clinical s/s of aspiration during po trials.  However, does appear to exhibit Baseline Phlegm. Pt is Not on a PPI; suspect REFLUX d/t s/s w/ potential impact from ETOH abuse.   Pt appears at reduced risk for aspiration following general aspiration precautions, using a modified diet consistency for ease of oral phase mastication, and when given Supervision to lessen Impulsive drinking behaviors and provide cues during oral intake for follow through.   Pt does have challenging factors that could impact oropharyngeal swallowing to include declined Cognitive functioning/ETOH abuse baseline, fatigue/weakness s/p acute illness, week self-feeding abilities, and poor/missing Dentition. Also suspect potential min Esophageal phase  dysmotility/REFLUX in setting of belching w/ trials. These factors can increase risk for aspiration, dysphagia as well as decreased oral intake overall.    During po trials, pt consumed all consistencies w/ no immediate, overt coughing, decline in vocal quality, or change in respiratory presentation during/post trials; no wet vocal quality or change in breathing. Oral phase appeared grossly [facility] w/ timely bolus management and control of bolus propulsion for A-P transfer for swallowing. Bolus mastication was impacted by Missing/Poor Dentition and required Time and moistening boluses cut Small -- adequate for the diet consistency. Oral clearing achieved w/ all trial consistencies.      Recommend continue a more Minced diet consistency, Level 2 w/ well-chopped meats, moistened foods(d/t Missing Dentition, Cognition); Thin liquids -- carefully monitor straw use, and pt should help to Hold Cup when drinking. Pinch straw to limit Impulsive drinking - small sips. Recommend general aspiration precautions, Pills CRUSHED vs WHOLE in Puree for safer, easier swallowing. Feeding support at meals d/t Cognitive status.  Education given on Pills in Puree; food consistencies and easy to eat options; general aspiration precautions to pt and Sister. NSG updated, agreed. MD updated. Recommend Dietician f/u for support.  Recommend continue w/ current diet consistency for optimal oral intake and f/u at next venue of care post continued resolution of ETOH Withdrawal and its s/s as well as post seizure activity for Cognitive-linguistic assessment to determine any needs targeting ADLs and Safety awareness.      HPI HPI: Pt is a 72 year old male with a past medical history significant for hypertension and alcohol abuse who presented to [facility] ED on 02/20/2022 after being found down and unresponsive by a friend at home. He had 3 seizures with EMS during which eyes were deviated to the right.  Patient was intubated and admitted into  PCCM, neurology was consulted.  Patient has history of EtOH abuse, suspected withdrawal syndrome/delirium tremens.  Patient was extubated on 02/22/2022.  MRI: No acute intracranial abnormality.  2. Old left parietal lobe infarct and findings of chronic small  vessel disease.  CXR at admit: No acute abnormalities.      SLP Plan  All goals met      Recommendations for follow up therapy are one component of a multi-disciplinary discharge planning process, led by the attending physician.  Recommendations may be updated based on patient status, additional functional criteria and insurance authorization.    Recommendations  Diet recommendations: Dysphagia 2 (fine chop);Thin liquid (d/t Missing Dentition; Cognitive decline) Liquids provided via: Cup;Straw (monitor) Medication Administration: Whole meds with puree (vs need to CRUSH in puree) Supervision: Patient able to self feed;Intermittent supervision to cue for compensatory strategies Compensations: Minimize environmental distractions;Slow rate;Small sips/bites;Lingual sweep for clearance of pocketing;Follow solids with liquid Postural Changes and/or Swallow Maneuvers: Out of bed for meals;Seated upright 90 degrees;Upright 30-60 min after meal (REFLUX precs)                General recommendations:  (Palliative Care; dietician) Oral Care Recommendations: Oral care BID;Oral care before and after PO;Staff/trained caregiver to provide oral care Follow Up Recommendations: Follow physician's recommendations for discharge plan and follow up therapies (next venue of care) Assistance recommended at discharge: Set up Supervision/Assistance SLP Visit Diagnosis: Dysphagia, oral phase (R13.11) (poor Dentition status baseline; Cognitive decline; ETOH abuse) Plan: All goals met             John Kenner, MS, CCC-SLP Speech Language Pathologist Rehab Services; [place] [phone] (ascom) [place]cell [place]bluth  02/28/2022, 3:01 PM

## 2022-02-28 NOTE — Progress Notes (Signed)
[facility] Progress Note  Patient: John Spears    OAC:[id number]  DOA: 02/20/2022     Date of Service: the patient was seen and examined on 02/28/2022  Chief Complaint  Patient presents with   Seizures   Brief hospital course: John Spears is a 72 year old male with a past medical history significant for hypertension and alcohol abuse who presented to [facility] ED on 02/20/2022 after being found down and unresponsive by a friend at home.  Patient was intubated and admitted into PCCM, neurology was consulted.  Patient has history of EtOH abuse, suspected withdrawal syndrome/delirium tremens.  Patient was extubated on 02/22/2022.  Patient was stabilized in the ICU and downgraded under hospitalist care on 02/24/22   Assessment and Plan:  # Aspiration pneumonia --on nasal canula  12/29 s/p unasyn and on 1/3 started Augmentin , Abx for 7 days total SLP eval done, started diet  Blood culture negative    #Mildly elevated Troponin, suspect demand ischemia Systolic CHF, unknown acuity, POA PMHx: Hypertension -Continuous cardiac monitoring -Maintain MAP >65 -s/p IV fluids and S/p Vasopressors, lactic acid was trended  -Troponin peaked 291, trending down. TTE shows LVEF 30 to 35%, LV moderately decreased function, no regional wall motion abnormality, moderate LV hypertrophy of the basal septal segment.  Moderately elevated pulmonary artery systolic pressure RVSP 52 mmHg Cardiology consulted, recommended GMT, increased Coreg 12.5 mg p.o. twice daily, started Entresto-51 mg p.o. twice daily, Lasix 40 mg p.o. twice daily, discontinued losartan and amlodipine.  Patient will need workup possible cardiac cath as an outpatient when stable.  Recommend to follow with cardiology as an outpatient.  Continue fluid restriction 1.5 L/day, start diuretics if volume overload. Started aspirin 81 mg p.o. daily as well.   # Hypokalemia secondary to diarrhea, developed on at night on 1/2 Potassium  repleted. Monitor and replete as needed GI pathogen negative, no fever or leukocytosis, less likely C. Difficile  Hypomagnesemia, started magnesium oxide 400 mg p.o. twice daily for 5 days  #AG Metabolic Acidosis, Resolved  #Lactic Acidosis, suspect due to recurrent seizures -Monitor I&O's / urinary output -Follow BMP -Ensure adequate renal perfusion -Avoid nephrotoxic agents as able -Replace electrolytes as indicated -s/p IV fluids   #Hyperglycemia -CBG's q4h; Target range of 140 to 180 -SSI -Follow ICU Hypo/Hyperglycemia protocol   Seizure disorder S/p Keppra 1 g IV q. 12 hourly, transition to oral  Continue fall precaution and seizure precautions Follow neurology   #Delerium Tremens - RESOLVED  -S/P FULL SCOPE OF THERAPY  WITH SEDATION WHILE ON VENTILATOR   Iron deficiency anemia, transferrin saturation 13%, we will start oral iron supplement.  Patient is able to swallow  Vitamin D deficiency: started vitamin D 50,000 units p.o. weekly, follow with PCP to repeat vitamin D level after 3 to 6 months. Vitamin B12 level 304, target >400, started vitamin B12 oral supplement.  Body mass index is 24.26 kg/m.  Interventions:       Diet: Dysphagia 2 diet DVT Prophylaxis: Subcutaneous Heparin    Advance goals of care discussion: Full code  Family Communication: family was NOT present at bedside, at the time of interview.  The pt provided permission to discuss medical plan with the family. Opportunity was given to ask question and all questions were answered satisfactorily.   Disposition:  Pt is from Home, admitted with seizures and delirium tremens s/p intubation, aspiration pneumonia, on IV antibiotics, which precludes a safe discharge. Discharge to SNF, when bed will be available.  Clinically stable.  Subjective: No significant overnight events, patient is alert and awake, AAO x 3, patient was sitting at the edge of the bed, without any distress.  Denied any headache  or dizziness, no chest pain or palpitation.  Physical Exam: General:  alert and awake, AAOx 3 Appear in no distress, affect flat, depressed  Eyes: PERRLA ENT: Oral Mucosa Clear, moist  Neck: NO JVD,  Cardiovascular: S1 and S2 Present, no Murmur,  Respiratory: good respiratory effort, Bilateral Air entry equal and Decreased, mild Crackles, no wheezes Abdomen: Bowel Sound present, Soft and no tenderness,  Skin: no rashes Extremities: no Pedal edema, no calf tenderness Neurologic: without any new focal findings Gait not checked due to patient safety concerns  Vitals:   02/28/22 0007 02/28/22 0513 02/28/22 0804 02/28/22 1138  BP: 134/83 (!) 142/78 128/82 (!) 141/118  Pulse: 84 72 78 78  Resp: 16 16 16 16   Temp: 98.2 F (36.8 C) 98.5 F (36.9 C) 98 F (36.7 C) 97.9 F (36.6 C)  TempSrc:      SpO2: 100% 100% 100% 100%  Weight:      Height:        Intake/Output Summary (Last 24 hours) at 02/28/2022 1239 Last data filed at 02/28/2022 0400 Gross per 24 hour  Intake 60 ml  Output 600 ml  Net -540 ml   Filed Weights   02/24/22 0410 02/25/22 0500 02/26/22 0500  Weight: 78.9 kg 83.4 kg 81 kg    Data Reviewed: I have personally reviewed and interpreted daily labs, tele strips, imagings as discussed above. I reviewed all nursing notes, pharmacy notes, vitals, pertinent old records I have discussed plan of care as described above with RN and patient/family.  CBC: Recent Labs  Lab 02/24/22 0426 02/25/22 0542 02/26/22 0628 02/27/22 0635 02/28/22 0501  WBC 8.5 7.8 4.1 4.2 4.6  HGB 9.0* 8.2* 8.0* 8.7* 9.0*  HCT 27.5* 24.6* 24.5* 26.2* 26.8*  MCV 101.5* 101.2* 100.4* 99.6 99.3  PLT 136* 129* 150 174 190   Basic Metabolic Panel: Recent Labs  Lab 02/23/22 0431 02/24/22 0426 02/25/22 0542 02/26/22 0628 02/27/22 0635 02/28/22 0501  NA 142 142 139 138 138 140  K 3.2* 3.0* 2.9* 3.8 3.5 3.7  CL 110 104 107 108 105 107  CO2 22 24 24 24 25 25   GLUCOSE 95 95 118* 90 88 102*   BUN 11 13 17 13 12 11   CREATININE 0.93 1.09 1.03 0.83 0.80 0.99  CALCIUM 8.7* 8.7* 8.5* 8.7* 8.9 9.0  MG 1.8 1.7 1.7 2.0 1.5*  --   PHOS 4.1 3.4 2.7 2.8 3.4  --     Studies: No results found.  Scheduled Meds:  amoxicillin-clavulanate  1 tablet Oral Q12H   atorvastatin  40 mg Oral Daily   carvedilol  25 mg Oral BID WC   Chlorhexidine Gluconate Cloth  6 each Topical Daily   vitamin B-12  1,000 mcg Oral Daily   famotidine  20 mg Oral QHS   folic acid  1 mg Oral Daily   furosemide  40 mg Oral BID   guaiFENesin  600 mg Oral BID   heparin  5,000 Units Subcutaneous Q8H   iron polysaccharides  150 mg Oral Daily   levETIRAcetam  1,000 mg Oral BID   LORazepam  1 mg Intravenous Once   magnesium oxide  400 mg Oral BID   multivitamin with minerals  1 tablet Oral Daily   pantoprazole  40 mg Oral Daily   potassium chloride  20 mEq Oral BID   sacubitril-valsartan  1 tablet Oral BID   spironolactone  25 mg Oral Daily   thiamine  100 mg Oral Daily   Vitamin D (Ergocalciferol)  50,000 Units Oral Q7 days   Continuous Infusions:   PRN Meds: acetaminophen, bisacodyl, bisacodyl, ibuprofen, metoprolol tartrate, polyethylene glycol  Time spent: 40 minutes  Author: Val Riles. MD [facility] 02/28/2022 12:39 PM  To reach On-call, see care teams to locate the attending and reach out to them via [email or web address]. If 7PM-7AM, please contact night-coverage If you still have difficulty reaching the attending provider, please page the [facility] (Director on Call) for [facility] on amion for assistance.

## 2022-03-01 DIAGNOSIS — R569 Unspecified convulsions: Secondary | ICD-10-CM | POA: Diagnosis not present

## 2022-03-01 LAB — CULTURE, BLOOD (ROUTINE X 2)
Culture: NO GROWTH
Culture: NO GROWTH
Special Requests: ADEQUATE
Special Requests: ADEQUATE

## 2022-03-01 LAB — CBC
HCT: 29.1 % — ABNORMAL LOW (ref 39.0–52.0)
Hemoglobin: 9.6 g/dL — ABNORMAL LOW (ref 13.0–17.0)
MCH: 32.9 pg (ref 26.0–34.0)
MCHC: 33 g/dL (ref 30.0–36.0)
MCV: 99.7 fL (ref 80.0–100.0)
Platelets: 235 10*3/uL (ref 150–400)
RBC: 2.92 MIL/uL — ABNORMAL LOW (ref 4.22–5.81)
RDW: 17.2 % — ABNORMAL HIGH (ref 11.5–15.5)
WBC: 6.2 10*3/uL (ref 4.0–10.5)
nRBC: 0 % (ref 0.0–0.2)

## 2022-03-01 LAB — BASIC METABOLIC PANEL
Anion gap: 10 (ref 5–15)
BUN: 11 mg/dL (ref 8–23)
CO2: 24 mmol/L (ref 22–32)
Calcium: 9.2 mg/dL (ref 8.9–10.3)
Chloride: 106 mmol/L (ref 98–111)
Creatinine, Ser: 1.14 mg/dL (ref 0.61–1.24)
GFR, Estimated: >60 mL/min (ref >60)
Glucose, Bld: 99 mg/dL (ref 70–99)
Potassium: 3.9 mmol/L (ref 3.5–5.1)
Sodium: 140 mmol/L (ref 135–145)

## 2022-03-01 LAB — GLUCOSE, CAPILLARY
Glucose-Capillary: 86 mg/dL (ref 70–99)
Glucose-Capillary: 83 mg/dL (ref 70–99)
Glucose-Capillary: 110 mg/dL — ABNORMAL HIGH (ref 70–99)

## 2022-03-01 NOTE — Progress Notes (Signed)
[facility] Progress Note  Patient: John Spears    RXV:[id number]  DOA: 02/20/2022     Date of Service: the patient was seen and examined on 03/01/2022  Chief Complaint  Patient presents with   Seizures   Brief hospital course: John Spears is a 72 year old male with a past medical history significant for hypertension and alcohol abuse who presented to [facility] ED on 02/20/2022 after being found down and unresponsive by a friend at home.  Patient was intubated and admitted into PCCM, neurology was consulted.  Patient has history of EtOH abuse, suspected withdrawal syndrome/delirium tremens.  Patient was extubated on 02/22/2022.  Patient was stabilized in the ICU and downgraded under hospitalist care on 02/24/22   Assessment and Plan:  # Aspiration pneumonia --on nasal canula  12/29 s/p unasyn and on 1/3 started Augmentin , Abx for 7 days total SLP eval done, started diet  Blood culture negative    #Mildly elevated Troponin, suspect demand ischemia Systolic CHF, unknown acuity, POA PMHx: Hypertension -Continuous cardiac monitoring -Maintain MAP >65 -s/p IV fluids and S/p Vasopressors, lactic acid was trended  -Troponin peaked 291, trending down. TTE shows LVEF 30 to 35%, LV moderately decreased function, no regional wall motion abnormality, moderate LV hypertrophy of the basal septal segment.  Moderately elevated pulmonary artery systolic pressure RVSP 52 mmHg Cardiology consulted, recommended GMT, increased Coreg 12.5 mg p.o. twice daily, started Entresto-51 mg p.o. twice daily, Lasix 40 mg p.o. twice daily, discontinued losartan and amlodipine.  Patient will need workup possible cardiac cath as an outpatient when stable.  Recommend to follow with cardiology as an outpatient.  Continue fluid restriction 1.5 L/day, start diuretics if volume overload. Started aspirin 81 mg p.o. daily as well.   # Hypokalemia secondary to diarrhea, developed on at night on 1/2 Potassium  repleted. Monitor and replete as needed GI pathogen negative, no fever or leukocytosis, less likely C. Difficile  Hypomagnesemia, started magnesium oxide 400 mg p.o. twice daily for 5 days  #AG Metabolic Acidosis, Resolved  #Lactic Acidosis, suspect due to recurrent seizures -Monitor I&O's / urinary output -Follow BMP -Ensure adequate renal perfusion -Avoid nephrotoxic agents as able -Replace electrolytes as indicated -s/p IV fluids   #Hyperglycemia, resolved. CBG is well controlled now -CBG's q4h; Target range of 140 to 180 -SSI -Follow ICU Hypo/Hyperglycemia protocol   Seizure disorder S/p Keppra 1 g IV q. 12 hourly, transition to oral  Continue fall precaution and seizure precautions Follow neurology   #Delerium Tremens - RESOLVED  -S/P FULL SCOPE OF THERAPY  WITH SEDATION WHILE ON VENTILATOR   Iron deficiency anemia, transferrin saturation 13%, we will start oral iron supplement.  Patient is able to swallow  Vitamin D deficiency: started vitamin D 50,000 units p.o. weekly, follow with PCP to repeat vitamin D level after 3 to 6 months. Vitamin B12 level 304, target >400, started vitamin B12 oral supplement.  Body mass index is 24.26 kg/m.  Interventions:       Diet: Dysphagia 2 diet DVT Prophylaxis: Subcutaneous Heparin    Advance goals of care discussion: Full code  Family Communication: family was NOT present at bedside, at the time of interview.  The pt provided permission to discuss medical plan with the family. Opportunity was given to ask question and all questions were answered satisfactorily.   Disposition:  Pt is from Home, admitted with seizures and delirium tremens s/p intubation, aspiration pneumonia, on IV antibiotics, which precludes a safe discharge. Discharge to SNF, when bed will  be available.  Clinically stable.    Subjective: No significant overnight events, patient was sleepy, stated that his sleep was okay, no any  active issues.  Physical  Exam: General:  sleepy, NAD  Appear in no distress, affect flat, depressed  Eyes: PERRLA ENT: Oral Mucosa Clear, moist  Neck: NO JVD,  Cardiovascular: S1 and S2 Present, no Murmur,  Respiratory: good respiratory effort, Bilateral Air entry equal and Decreased, mild Crackles, no wheezes Abdomen: Bowel Sound present, Soft and no tenderness,  Skin: no rashes Extremities: no Pedal edema, no calf tenderness Neurologic: without any new focal findings Gait not checked due to patient safety concerns  Vitals:   02/28/22 2124 02/28/22 2354 03/01/22 0517 03/01/22 0817  BP: (!) 140/72 113/68 112/76 121/61  Pulse: 80 83 79 76  Resp: 18 18 18 16   Temp: 98.1 F (36.7 C) 98.4 F (36.9 C) 97.8 F (36.6 C) 98 F (36.7 C)  TempSrc: Oral  Oral   SpO2: 100% 100% 100% 100%  Weight:      Height:        Intake/Output Summary (Last 24 hours) at 03/01/2022 1120 Last data filed at 03/01/2022 0500 Gross per 24 hour  Intake 160 ml  Output --  Net 160 ml   Filed Weights   02/24/22 0410 02/25/22 0500 02/26/22 0500  Weight: 78.9 kg 83.4 kg 81 kg    Data Reviewed: I have personally reviewed and interpreted daily labs, tele strips, imagings as discussed above. I reviewed all nursing notes, pharmacy notes, vitals, pertinent old records I have discussed plan of care as described above with RN and patient/family.  CBC: Recent Labs  Lab 02/25/22 0542 02/26/22 0628 02/27/22 0635 02/28/22 0501 03/01/22 0501  WBC 7.8 4.1 4.2 4.6 6.2  HGB 8.2* 8.0* 8.7* 9.0* 9.6*  HCT 24.6* 24.5* 26.2* 26.8* 29.1*  MCV 101.2* 100.4* 99.6 99.3 99.7  PLT 129* 150 174 190 235   Basic Metabolic Panel: Recent Labs  Lab 02/23/22 0431 02/24/22 0426 02/25/22 0542 02/26/22 0628 02/27/22 0635 02/28/22 0501 03/01/22 0501  NA 142 142 139 138 138 140 140  K 3.2* 3.0* 2.9* 3.8 3.5 3.7 3.9  CL 110 104 107 108 105 107 106  CO2 22 24 24 24 25 25 24   GLUCOSE 95 95 118* 90 88 102* 99  BUN 11 13 17 13 12 11 11   CREATININE  0.93 1.09 1.03 0.83 0.80 0.99 1.14  CALCIUM 8.7* 8.7* 8.5* 8.7* 8.9 9.0 9.2  MG 1.8 1.7 1.7 2.0 1.5*  --   --   PHOS 4.1 3.4 2.7 2.8 3.4  --   --     Studies: No results found.  Scheduled Meds:  amoxicillin-clavulanate  1 tablet Oral Q12H   aspirin  81 mg Oral Daily   atorvastatin  40 mg Oral Daily   carvedilol  25 mg Oral BID WC   Chlorhexidine Gluconate Cloth  6 each Topical Daily   vitamin B-12  1,000 mcg Oral Daily   famotidine  20 mg Oral QHS   folic acid  1 mg Oral Daily   furosemide  40 mg Oral BID   guaiFENesin  600 mg Oral BID   heparin  5,000 Units Subcutaneous Q8H   iron polysaccharides  150 mg Oral Daily   levETIRAcetam  1,000 mg Oral BID   LORazepam  1 mg Intravenous Once   magnesium oxide  400 mg Oral BID   multivitamin with minerals  1 tablet Oral Daily  pantoprazole  40 mg Oral Daily   potassium chloride  20 mEq Oral BID   sacubitril-valsartan  1 tablet Oral BID   spironolactone  25 mg Oral Daily   thiamine  100 mg Oral Daily   Vitamin D (Ergocalciferol)  50,000 Units Oral Q7 days   Continuous Infusions:   PRN Meds: acetaminophen, bisacodyl, bisacodyl, ibuprofen, metoprolol tartrate, polyethylene glycol  Time spent: 40 minutes  Author: Gillis Santa. MD [facility] 03/01/2022 11:20 AM  To reach On-call, see care teams to locate the attending and reach out to them via [email or web address]. If 7PM-7AM, please contact night-coverage If you still have difficulty reaching the attending provider, please page the [facility] (Director on Call) for [facility] on amion for assistance.

## 2022-03-01 NOTE — TOC Progression Note (Signed)
Transition of Care [facility]) - Progression Note    Patient Details  Name: John Spears MRN: [id number] Date of Birth: 1950-12-29  Transition of [facility] CM/SW Contact  Izola Price, RN Phone Number: 03/01/2022, 10:22 AM  Clinical Narrative: 03/01/21:  Josem Kaufmann remains pending as of 1020 am today. Notified [facility] of pending discharge Monday or as soon as auth received and confirmed with [facility]. Provider updated.  Plan Auth ID #[id number] nH  Jacques Navy ID #[id number]  Simmie Davies RN CM        Expected Discharge Plan and Services                                               Social Determinants of Health (SDOH) Interventions SDOH Screenings   Tobacco Use: Medium Risk (02/20/2022)    Readmission Risk Interventions     No data to display

## 2022-03-02 DIAGNOSIS — R569 Unspecified convulsions: Secondary | ICD-10-CM | POA: Diagnosis not present

## 2022-03-02 NOTE — Progress Notes (Signed)
[facility] Progress Note  Patient: John Spears    [id number]  DOA: 02/20/2022     Date of Service: the patient was seen and examined on 03/02/2022  Chief Complaint  Patient presents with   Seizures   Brief hospital course: Amalio Corvi is a 72 year old male with a past medical history significant for hypertension and alcohol abuse who presented to [facility] ED on 02/20/2022 after being found down and unresponsive by a friend at home.  Patient was intubated and admitted into PCCM, neurology was consulted.  Patient has history of EtOH abuse, suspected withdrawal syndrome/delirium tremens.  Patient was extubated on 02/22/2022.  Patient was stabilized in the ICU and downgraded under hospitalist care on 02/24/22   Assessment and Plan:  # Aspiration pneumonia --on nasal canula  12/29 s/p unasyn and on 1/3 started Augmentin , Abx for 7 days total SLP eval done, started diet  Blood culture negative    #Mildly elevated Troponin, suspect demand ischemia Systolic CHF, unknown acuity, POA PMHx: Hypertension -Continuous cardiac monitoring -Maintain MAP >65 -s/p IV fluids and S/p Vasopressors, lactic acid was trended  -Troponin peaked 291, trending down. TTE shows LVEF 30 to 35%, LV moderately decreased function, no regional wall motion abnormality, moderate LV hypertrophy of the basal septal segment.  Moderately elevated pulmonary artery systolic pressure RVSP 52 mmHg Cardiology consulted, recommended GMT, increased Coreg 12.5 mg p.o. twice daily, started Entresto-51 mg p.o. twice daily, Lasix 40 mg p.o. twice daily, discontinued losartan and amlodipine.  Patient will need workup possible cardiac cath as an outpatient when stable.  Recommend to follow with cardiology as an outpatient.  Continue fluid restriction 1.5 L/day, start diuretics if volume overload. Started aspirin 81 mg p.o. daily as well.   # Hypokalemia secondary to diarrhea, developed on at night on 1/2 Potassium  repleted. Monitor and replete as needed GI pathogen negative, no fever or leukocytosis, less likely C. Difficile  Hypomagnesemia, started magnesium oxide 400 mg p.o. twice daily for 5 days  #AG Metabolic Acidosis, Resolved  #Lactic Acidosis, suspect due to recurrent seizures -Monitor I&O's / urinary output -Follow BMP -Ensure adequate renal perfusion -Avoid nephrotoxic agents as able -Replace electrolytes as indicated -s/p IV fluids   #Hyperglycemia, resolved. CBG is well controlled now -CBG's q4h; Target range of 140 to 180 -SSI -Follow ICU Hypo/Hyperglycemia protocol   Seizure disorder S/p Keppra 1 g IV q. 12 hourly, transition to oral  Continue fall precaution and seizure precautions Follow neurology   #Delerium Tremens - RESOLVED  -S/P FULL SCOPE OF THERAPY  WITH SEDATION WHILE ON VENTILATOR   Iron deficiency anemia, transferrin saturation 13%, we will start oral iron supplement.  Patient is able to swallow  Vitamin D deficiency: started vitamin D 50,000 units p.o. weekly, follow with PCP to repeat vitamin D level after 3 to 6 months. Vitamin B12 level 304, target >400, started vitamin B12 oral supplement.  Body mass index is 24.26 kg/m.  Interventions:       Diet: Dysphagia 2 diet DVT Prophylaxis: Subcutaneous Heparin    Advance goals of care discussion: Full code  Family Communication: family was NOT present at bedside, at the time of interview.  The pt provided permission to discuss medical plan with the family. Opportunity was given to ask question and all questions were answered satisfactorily.   Disposition:  Pt is from Home, admitted with seizures and delirium tremens s/p intubation, aspiration pneumonia, on IV antibiotics, which precludes a safe discharge. Discharge to SNF, when bed will  be available.  Clinically stable.    Subjective: No significant overnight events, patient was sleepy, stated that his sleep was okay, no any  active issues.  Physical  Exam: General:  sleepy, NAD  Appear in no distress, affect flat, depressed  Eyes: PERRLA ENT: Oral Mucosa Clear, moist  Neck: NO JVD,  Cardiovascular: S1 and S2 Present, no Murmur,  Respiratory: good respiratory effort, Bilateral Air entry equal and Decreased, mild Crackles, no wheezes Abdomen: Bowel Sound present, Soft and no tenderness,  Skin: no rashes Extremities: no Pedal edema, no calf tenderness Neurologic: without any new focal findings Gait not checked due to patient safety concerns  Vitals:   03/01/22 2024 03/02/22 0500 03/02/22 0545 03/02/22 0849  BP: 115/62  113/64 134/64  Pulse: 74  79 74  Resp: 16  16 18   Temp: 98 F (36.7 C)  98.2 F (36.8 C) 97.8 F (36.6 C)  TempSrc: Oral  Oral   SpO2: 100%  100% 100%  Weight:  80.7 kg    Height:       No intake or output data in the 24 hours ending 03/02/22 1405  Filed Weights   02/25/22 0500 02/26/22 0500 03/02/22 0500  Weight: 83.4 kg 81 kg 80.7 kg    Data Reviewed: I have personally reviewed and interpreted daily labs, tele strips, imagings as discussed above. I reviewed all nursing notes, pharmacy notes, vitals, pertinent old records I have discussed plan of care as described above with RN and patient/family.  CBC: Recent Labs  Lab 02/25/22 0542 02/26/22 0628 02/27/22 0635 02/28/22 0501 03/01/22 0501  WBC 7.8 4.1 4.2 4.6 6.2  HGB 8.2* 8.0* 8.7* 9.0* 9.6*  HCT 24.6* 24.5* 26.2* 26.8* 29.1*  MCV 101.2* 100.4* 99.6 99.3 99.7  PLT 129* 150 174 190 462   Basic Metabolic Panel: Recent Labs  Lab 02/24/22 0426 02/25/22 0542 02/26/22 0628 02/27/22 0635 02/28/22 0501 03/01/22 0501  NA 142 139 138 138 140 140  K 3.0* 2.9* 3.8 3.5 3.7 3.9  CL 104 107 108 105 107 106  CO2 24 24 24 25 25 24   GLUCOSE 95 118* 90 88 102* 99  BUN 13 17 13 12 11 11   CREATININE 1.09 1.03 0.83 0.80 0.99 1.14  CALCIUM 8.7* 8.5* 8.7* 8.9 9.0 9.2  MG 1.7 1.7 2.0 1.5*  --   --   PHOS 3.4 2.7 2.8 3.4  --   --     Studies: No  results found.  Scheduled Meds:  aspirin  81 mg Oral Daily   atorvastatin  40 mg Oral Daily   carvedilol  25 mg Oral BID WC   Chlorhexidine Gluconate Cloth  6 each Topical Daily   vitamin B-12  1,000 mcg Oral Daily   famotidine  20 mg Oral QHS   folic acid  1 mg Oral Daily   furosemide  40 mg Oral BID   guaiFENesin  600 mg Oral BID   heparin  5,000 Units Subcutaneous Q8H   iron polysaccharides  150 mg Oral Daily   levETIRAcetam  1,000 mg Oral BID   LORazepam  1 mg Intravenous Once   magnesium oxide  400 mg Oral BID   multivitamin with minerals  1 tablet Oral Daily   pantoprazole  40 mg Oral Daily   potassium chloride  20 mEq Oral BID   sacubitril-valsartan  1 tablet Oral BID   spironolactone  25 mg Oral Daily   thiamine  100 mg Oral Daily   Vitamin  D (Ergocalciferol)  50,000 Units Oral Q7 days   Continuous Infusions:   PRN Meds: acetaminophen, bisacodyl, bisacodyl, ibuprofen, metoprolol tartrate, polyethylene glycol  Time spent: 40 minutes  Author: Gillis Santa. MD [facility] 03/02/2022 2:05 PM  To reach On-call, see care teams to locate the attending and reach out to them via [email or web address]. If 7PM-7AM, please contact night-coverage If you still have difficulty reaching the attending provider, please page the [facility] (Director on Call) for [facility] on amion for assistance.

## 2022-03-02 NOTE — TOC Progression Note (Addendum)
Transition of Care [facility]) - Progression Note    Patient Details  Name: John Spears MRN: [id number] Date of Birth: [birth year]  Transition of Care [facility]) CM/SW Contact  Gerilyn Pilgrim, LCSW Phone Number: 03/02/2022, 2:26 PM  Clinical Narrative:   as of 2:26pm auth for pt is still pending.   3:09pm [employer] called and states that they need a PLOF and where pt was residing prior to admission added to a note and uploaded before they can authorize the pt for rehab.         Expected Discharge Plan and Services                                               Social Determinants of Health (SDOH) Interventions SDOH Screenings   Tobacco Use: Medium Risk (02/20/2022)    Readmission Risk Interventions     No data to display

## 2022-03-02 NOTE — Care Management Important Message (Signed)
Important Message  Patient Details  Name: John Spears MRN: [id number] Date of Birth: 08/24/1950   Medicare Important Message Given:  Yes     Juliann Pulse A Horton [place] 03/02/2022, 1:33 PM

## 2022-03-02 NOTE — Progress Notes (Signed)
Occupational Therapy Treatment Patient Details Name: John Spears MRN: [id number] DOB: September 26, 1950 Today's Date: 03/02/2022   History of present illness Pt is a 72 year old male with a past medical history significant for hypertension and alcohol abuse who presented to [facility] ED on 02/20/2022 after being found down and unresponsive.  MD assessment includes: aspiration pneumonia, mildly elevated troponin with suspected demand ischemia, lactic acidosis with recurrent seizures, and hyperglycemia.   OT comments  John Spears was seen for OT treatment on this date. Upon arrival to room pt seated in chair, agreeable to tx. Pt requires CGA no AD for in room mobility improving to SBA + RW use ~50 ft. Functional reaching task retrieving items from wasit, knee, and above head heights - LOBs noted reaching outside BOS. Unable to squat safely to grab objects from knee height, pt remains high falls risk and lives alone, STR remains safe recommendation. Pt making good progress toward goals, will continue to follow POC. Discharge recommendation remains appropriate.     Recommendations for follow up therapy are one component of a multi-disciplinary discharge planning process, led by the attending physician.  Recommendations may be updated based on patient status, additional functional criteria and insurance authorization.    Follow Up Recommendations  Skilled nursing-short term rehab (<3 hours/day)     Assistance Recommended at Discharge Frequent or constant Supervision/Assistance  Patient can return home with the following  A little help with bathing/dressing/bathroom;A little help with walking and/or transfers;Assistance with cooking/housework;Assist for transportation   Equipment Recommendations  Other (comment) (defer)    Recommendations for Other Services      Precautions / Restrictions Precautions Precautions: Fall Precaution Comments: Seizure precautions Restrictions Weight Bearing Restrictions:  No Other Position/Activity Restrictions: Seizure precautions       Mobility Bed Mobility               General bed mobility comments: NT    Transfers Overall transfer level: Needs assistance Equipment used: None Transfers: Sit to/from Stand Sit to Stand: Min guard           General transfer comment: SBA     Balance Overall balance assessment: Needs assistance Sitting-balance support: No upper extremity supported, Feet supported Sitting balance-Leahy Scale: Good     Standing balance support: No upper extremity supported, During functional activity Standing balance-Leahy Scale: Fair       Tandem Stance - Right Leg:  (unable to ahcieve) Tandem Stance - Left Leg:  (unable to achieve)                   ADL either performed or assessed with clinical judgement   ADL Overall ADL's : Needs assistance/impaired                                       General ADL Comments: CGA no AD for in room mobility improving to SBA + RW use ~50 ft. Functional reachin task putting items at wasit, knee, and above head heihgts with LOBs noted reaching outside BOS. Unable to squat safely to grab objects from knee height.      Cognition Arousal/Alertness: Awake/alert Behavior During Therapy: Flat affect Overall Cognitive Status: No family/caregiver present to determine baseline cognitive functioning                                 General Comments: answers  questions appropriately, oriented to self, month as Jan, and location        Exercises Other Exercises Other Exercises: dynamic balnace and functional reach task, tolerates single leg stance ~5" each leg, unable to achieve tandem stance without LOBs            Pertinent Vitals/ Pain       Pain Assessment Pain Assessment: No/denies pain  Home Living Family/patient expects to be discharged to:: Private residence Living Arrangements: Alone Available Help at Discharge: Family;Available  PRN/intermittently Type of Home: House Home Access: Stairs to enter Entrance Stairs-Number of Steps: 3   Home Layout: One level               Home Equipment: Agricultural consultant (2 wheels);Cane - single point          Prior Functioning/Environment     PLOF: Mobility: Independent  ADLs: sister assists with groceries          Frequency  Min 2X/week        Progress Toward Goals  OT Goals(current goals can now be found in the care plan section)  Progress towards OT goals: Progressing toward goals  Acute Rehab OT Goals Patient Stated Goal: to get better OT Goal Formulation: With patient Time For Goal Achievement: 03/10/22 Potential to Achieve Goals: Fair ADL Goals Pt Will Perform Grooming: sitting;with min guard assist Pt Will Perform Lower Body Dressing: with min assist;sitting/lateral leans Pt Will Transfer to Toilet: with mod assist;squat pivot transfer;bedside commode  Plan Discharge plan remains appropriate;Frequency remains appropriate    Co-evaluation                 AM-PAC OT "6 Clicks" Daily Activity     Outcome Measure   Help from another person eating meals?: None Help from another person taking care of personal grooming?: A Little Help from another person toileting, which includes using toliet, bedpan, or urinal?: A Lot Help from another person bathing (including washing, rinsing, drying)?: A Little Help from another person to put on and taking off regular upper body clothing?: A Little Help from another person to put on and taking off regular lower body clothing?: A Little 6 Click Score: 18    End of Session    OT Visit Diagnosis: Unsteadiness on feet (R26.81);Muscle weakness (generalized) (M62.81)   Activity Tolerance Patient tolerated treatment well   Patient Left in chair;with call bell/phone within reach;with chair alarm set   Nurse Communication          Time: 0240-9735 OT Time Calculation (min): 9 min  Charges: OT  General Charges $OT Visit: 1 Visit OT Treatments $Therapeutic Activity: 8-22 mins  John Spears, M.S. OTR/L  03/02/22, 3:36 PM  ascom [phone]

## 2022-03-02 NOTE — Progress Notes (Signed)
Physical Therapy Treatment Patient Details Name: John Spears MRN: [id number] DOB: 1950-06-23 Today's Date: 03/02/2022   History of Present Illness Pt is a 72 year old male with a past medical history significant for hypertension and alcohol abuse who presented to [facility] ED on 02/20/2022 after being found down and unresponsive.  MD assessment includes: aspiration pneumonia, mildly elevated troponin with suspected demand ischemia, lactic acidosis with recurrent seizures, and hyperglycemia.    PT Comments    Focused session on mobility. Pt able to complete transfers with supervision. Fair tolerance for gait training with RW ~137ft and CGA for sfety. No LOB, however pt quick to fatigue. Awaiting d/c to SNF for strengthening and functional independence prior to returning home.    Recommendations for follow up therapy are one component of a multi-disciplinary discharge planning process, led by the attending physician.  Recommendations may be updated based on patient status, additional functional criteria and insurance authorization.  Follow Up Recommendations  Skilled nursing-short term rehab (<3 hours/day) Can patient physically be transported by private vehicle: Yes   Assistance Recommended at Discharge Frequent or constant Supervision/Assistance  Patient can return home with the following Assistance with cooking/housework;Direct supervision/assist for medications management;Direct supervision/assist for financial management;Assist for transportation;Help with stairs or ramp for entrance;A little help with walking and/or transfers;A little help with bathing/dressing/bathroom   Equipment Recommendations  Other (comment) (TBD at next facility)    Recommendations for Other Services       Precautions / Restrictions Precautions Precautions: Fall Precaution Comments: Seizure precautions Restrictions Weight Bearing Restrictions: No Other Position/Activity Restrictions: Seizure precautions      Mobility  Bed Mobility               General bed mobility comments: n/t pt received in chair    Transfers Overall transfer level: Needs assistance Equipment used: Rolling walker (2 wheels) Transfers: Sit to/from Stand Sit to Stand: Supervision           General transfer comment: Fair eccentric and concentric control and stability with UE support/assist required    Ambulation/Gait Ambulation/Gait assistance: Min guard Gait Distance (Feet): 100 Feet Assistive device: Rolling walker (2 wheels) Gait Pattern/deviations: Step-through pattern, Decreased step length - right, Decreased step length - left, Trunk flexed Gait velocity: decreased     General Gait Details: quick to fatique   Stairs             Wheelchair Mobility    Modified Rankin (Stroke Patients Only)       Balance Overall balance assessment: Needs assistance Sitting-balance support: Feet supported, Single extremity supported Sitting balance-Leahy Scale: Good     Standing balance support: No upper extremity supported, During functional activity Standing balance-Leahy Scale: Fair                              Cognition Arousal/Alertness: Awake/alert Behavior During Therapy: Flat affect Overall Cognitive Status: Within Functional Limits for tasks assessed                                 General Comments: Continued improvement in affect and verbal engagement        Exercises      General Comments        Pertinent Vitals/Pain Pain Assessment Pain Assessment: No/denies pain    Home Living  Prior Function            PT Goals (current goals can now be found in the care plan section) Progress towards PT goals: Progressing toward goals    Frequency    Min 2X/week      PT Plan Current plan remains appropriate    Co-evaluation              AM-PAC PT "6 Clicks" Mobility   Outcome Measure  Help  needed turning from your back to your side while in a flat bed without using bedrails?: None Help needed moving from lying on your back to sitting on the side of a flat bed without using bedrails?: None Help needed moving to and from a bed to a chair (including a wheelchair)?: A Little Help needed standing up from a chair using your arms (e.g., wheelchair or bedside chair)?: A Little Help needed to walk in hospital room?: A Little Help needed climbing 3-5 steps with a railing? : A Lot 6 Click Score: 19    End of Session Equipment Utilized During Treatment: Gait belt Activity Tolerance: Patient tolerated treatment well Patient left: in chair;with call bell/phone within reach;with chair alarm set Nurse Communication: Mobility status PT Visit Diagnosis: Unsteadiness on feet (R26.81);Difficulty in walking, not elsewhere classified (R26.2);Muscle weakness (generalized) (M62.81)     Time: 0737-1062 PT Time Calculation (min) (ACUTE ONLY): 16 min  Charges:  $Gait Training: 8-22 mins                    Zadie Cleverly, PTA    Jannet Askew 03/02/2022, 1:47 PM

## 2022-03-03 DIAGNOSIS — R569 Unspecified convulsions: Secondary | ICD-10-CM | POA: Diagnosis not present

## 2022-03-03 MED ORDER — FOLIC ACID 1 MG PO TABS: 1.0000 mg | ORAL_TABLET | Freq: Every day | ORAL | 0 refills | Status: AC

## 2022-03-03 MED ORDER — CYANOCOBALAMIN 1000 MCG PO TABS: 1000.0000 ug | ORAL_TABLET | Freq: Every day | ORAL | 2 refills | Status: AC

## 2022-03-03 MED ORDER — FUROSEMIDE 40 MG PO TABS: 40.0000 mg | ORAL_TABLET | Freq: Every day | ORAL | Status: DC

## 2022-03-03 MED ORDER — CARVEDILOL 25 MG PO TABS: 25.0000 mg | ORAL_TABLET | Freq: Two times a day (BID) | ORAL | 11 refills | Status: DC

## 2022-03-03 MED ORDER — BISACODYL 5 MG PO TBEC: 10.0000 mg | DELAYED_RELEASE_TABLET | Freq: Every day | ORAL | 0 refills | Status: DC | PRN

## 2022-03-03 MED ORDER — PANTOPRAZOLE SODIUM 40 MG PO TBEC: 40.0000 mg | DELAYED_RELEASE_TABLET | Freq: Every day | ORAL | 0 refills | Status: DC

## 2022-03-03 MED ORDER — VITAMIN B-1 100 MG PO TABS: 100.0000 mg | ORAL_TABLET | Freq: Every day | ORAL | 0 refills | Status: AC

## 2022-03-03 MED ORDER — BISACODYL 10 MG RE SUPP: 10.0000 mg | Freq: Every day | RECTAL | 0 refills | Status: DC | PRN

## 2022-03-03 MED ORDER — POLYSACCHARIDE IRON COMPLEX 150 MG PO CAPS: 150.0000 mg | ORAL_CAPSULE | Freq: Every day | ORAL | 2 refills | Status: DC

## 2022-03-03 MED ORDER — ASPIRIN 81 MG PO CHEW: 81.0000 mg | CHEWABLE_TABLET | Freq: Every day | ORAL | 11 refills | Status: DC

## 2022-03-03 MED ORDER — GUAIFENESIN-DM 100-10 MG/5ML PO SYRP: 5.0000 mL | ORAL_SOLUTION | ORAL | 0 refills | Status: DC | PRN

## 2022-03-03 MED ORDER — ADULT MULTIVITAMIN W/MINERALS CH: 1.0000 | ORAL_TABLET | Freq: Every day | ORAL | 0 refills | Status: AC

## 2022-03-03 MED ORDER — SACUBITRIL-VALSARTAN 49-51 MG PO TABS: 1.0000 | ORAL_TABLET | Freq: Two times a day (BID) | ORAL | Status: DC

## 2022-03-03 MED ORDER — SPIRONOLACTONE 25 MG PO TABS: 25.0000 mg | ORAL_TABLET | Freq: Every day | ORAL | 11 refills | Status: DC

## 2022-03-03 MED ORDER — LEVETIRACETAM 1000 MG PO TABS: 1000.0000 mg | ORAL_TABLET | Freq: Two times a day (BID) | ORAL | 11 refills | Status: DC

## 2022-03-03 MED ORDER — VITAMIN D (ERGOCALCIFEROL) 1.25 MG (50000 UNIT) PO CAPS: 50000.0000 [IU] | ORAL_CAPSULE | ORAL | Status: AC

## 2022-03-03 MED ORDER — POLYETHYLENE GLYCOL 3350 17 G PO PACK: 17.0000 g | PACK | Freq: Every day | ORAL | 0 refills | Status: DC | PRN

## 2022-03-03 MED ORDER — POTASSIUM CHLORIDE CRYS ER 20 MEQ PO TBCR: 20.0000 meq | EXTENDED_RELEASE_TABLET | Freq: Every day | ORAL | 11 refills | Status: DC

## 2022-03-03 NOTE — TOC Progression Note (Signed)
Transition of Care [facility]) - Progression Note    Patient Details  Name: John Spears MRN: [id number] Date of Birth: [birth year]  [facility]) CM/SW Contact  Gerilyn Pilgrim, LCSW Phone Number: 03/03/2022, 10:26 AM  Clinical Narrative:   Auth approved through [facility] until 1/11. [facility] notified, they are able to accept pt today.          Expected Discharge Plan and Services                                               Social Determinants of Health (SDOH) Interventions SDOH Screenings   Tobacco Use: Medium Risk (02/20/2022)    Readmission Risk Interventions     No data to display

## 2022-03-03 NOTE — TOC Transition Note (Signed)
Transition of Care [facility]) - CM/SW Discharge Note   Patient Details  Name: John Spears MRN: [id number] Date of Birth: 1950-11-01  Transition of Care [facility]) CM/SW Contact:  Gerilyn Pilgrim, LCSW Phone Number: 03/03/2022, 1:49 PM   Clinical Narrative:   [facility] called. Pt first in line. RN given number for report. DC summary sent to [facility]. Medical necessity forms printed to the unit.  CSW signing off.    Final next level of care: Skilled Nursing Facility Barriers to Discharge: Barriers Resolved   Patient Goals and CMS Choice CMS Medicare.gov Compare Post Acute Care list provided to:: Patient Represenative (must comment) (son Romin)    Discharge Placement                Patient chooses bed at: [facility] Patient to be transferred to facility by: [facility]   Patient and family notified of of transfer: 03/03/22  Discharge Plan and Services Additional resources added to the After Visit Summary for                                       Social Determinants of Health (SDOH) Interventions SDOH Screenings   Tobacco Use: Medium Risk (02/20/2022)     Readmission Risk Interventions     No data to display

## 2022-03-03 NOTE — Progress Notes (Signed)
Patient being discharged to [facility]. Called report to [employer] nurse at [facility]. All discharge paperwork is in discharge packet. Patient being transported via EMS. Sister in room at the time of discharge.

## 2022-03-03 NOTE — Discharge Summary (Signed)
[facility] Discharge Summary   Patient: John Spears GEX:[id number]  [facility]P: [facility]  Date of admission: 02/20/2022   Date of discharge:  03/03/2022     Discharge Diagnoses:  Principal Problem:   Seizures (HCC) Active Problems:   Cerebrovascular accident (CVA) (HCC)   Dilated cardiomyopathy (HCC)   Acute on chronic combined systolic and diastolic CHF (congestive heart failure) (HCC)   Alcohol abuse   Encephalopathy   Admitted From: Home Disposition:  SNF   Recommendations for Outpatient Follow-up:  PCP: Follow with PCP, need to seen by an MD in 1 to 2 days, continue to monitor BP and titrate medication accordingly.  Repeat CBC, BMP and Mag level after 1 week.  Repeat iron profile, vitamin B12 and vitamin D level after 3 to 6 months.  Follow-up with cardiology in 2 to 4 weeks for CHF management and may need ischemic cardiac workup as an outpatient. Follow-up with neurology in 2 to 4 weeks for seizure disorder Follow up LABS/TEST:  as above and chest x-ray after 4 weeks for resolution of pneumonia   Contact information for after-discharge care     Destination     [facility] Preferred [facility] .   Service: Skilled Nursing Contact information: [address] [place] [id number] [phone]                    Diet recommendation: Cardiac diet  Activity: The patient is advised to gradually reintroduce usual activities, as tolerated  Discharge Condition: stable  Code Status: Full code   History of present illness: As per the H and P dictated on admission Hospital Course:  John Spears is a 72 year old male with a past medical history significant for hypertension and alcohol abuse who [facility] ED on 02/20/2022 after being found down and unresponsive by a friend at home.  Patient was intubated and admitted into PCCM, neurology was consulted.  Patient has history of EtOH abuse, suspected withdrawal  syndrome/delirium tremens.  Patient was extubated on 02/22/2022.  Patient was stabilized in the ICU and downgraded under hospitalist care on 02/24/22  Assessment and Plan:   # Aspiration pneumonia,  Respiratory failure resolved, currently saturating well on room air.   12/29 s/p unasyn and on 1/3 started Augmentin , Abx for 7 days total, completed course. SLP eval done, started diet, Blood culture negative.  Repeat chest x-ray after 4 weeks for resolution of pneumonia   #Mildly elevated Troponin, suspect demand ischemia Systolic CHF, unknown acuity, POA, and h/o Hypertension Patient was monitored on telemetry, no significant events. s/p IV fluids and S/p Vasopressors, lactic acid was trended. Troponin peaked 291, trending down. TTE shows LVEF 30 to 35%, LV moderately decreased function, no regional wall motion abnormality, moderate LV hypertrophy of the basal septal segment.  Moderately elevated pulmonary artery systolic pressure RVSP 52 mmHg Cardiology consulted, recommended GMT, increased Coreg 25 mg p.o. twice daily, started Entresto 49-51 mg p.o. twice daily, Lasix 40 mg p.o. twice daily given during hospital stay, changed to Lasix 40 mg p.o. daily on discharge.  Started spironolactone 25 mg p.o. daily. discontinued HCTZ and amlodipine.  Patient will need workup possible cardiac cath as an outpatient when stable.  Recommend to follow with cardiology as an outpatient.  Continue fluid restriction 1.5 L/day. Started aspirin 81 mg p.o. daily.  Recommend to follow-up with cardiology in 2 to 4 weeks for further management as an outpatient.     # Hypokalemia secondary to  diarrhea, developed on at night on 1/2 Potassium repleted. GI pathogen negative, no fever or leukocytosis, less likely C. Difficile.  Continue oral potassium supplement as patient is on Lasix. # Hypomagnesemia, s/p magnesium oxide 400 mg p.o. twice daily for 5 days #AG Metabolic Acidosis, Resolved  #Lactic Acidosis, suspect due to  recurrent seizures. S/p IV fluid, lactic acidosis resolved.  Patient is stable. #Hyperglycemia, resolved. CBG is well controlled now # Seizure disorder, S/p Keppra 1 g IV q. 12 hourly, transition to oral.  Follow-up with neurology in 2 to 4 weeks. #Delerium Tremens: resolved, s/p fullscope of therapy with sedation while on ventilator.  Continue thiamine multivitamin and folic acid for 30 days. # Iron deficiency anemia, transferrin saturation 13%, started oral iron supplement.  Repeat iron profile after 3 to 6 months.   # Vitamin D deficiency: started vitamin D 50,000 units p.o. weekly, follow with PCP to repeat vitamin D level after 3 to 6 months. # Vitamin B12 level 304, target >400, started vitamin B12 oral supplement. Body mass index is 25.09 kg/m.  Nutrition Interventions:   - Patient was instructed, not to drive, operate heavy machinery, perform activities at heights, swimming or participation in water activities or provide baby sitting services while on Pain, Sleep and Anxiety Medications; until his outpatient Physician has advised to do so again.  - Also recommended to not to take more than prescribed Pain, Sleep and Anxiety Medications.  Patient was seen by physical therapy, who recommended SNF placement, which was arranged. On the day of the discharge the patient's vitals were stable, and no other acute medical condition were reported by patient. the patient was felt safe to be discharge at [facility].  Consultants: Pulmonary critical care, neurology, cardiology Procedures: s/p intubation with ventilator support  Discharge Exam: General: Appear in no distress, no Rash; Oral Mucosa Clear, moist. Cardiovascular: S1 and S2 Present, no Murmur, Respiratory: normal respiratory effort, Bilateral Air entry present and no Crackles, no wheezes Abdomen: Bowel Sound present, Soft and no tenderness, no hernia Extremities: no Pedal edema, no calf tenderness Neurology: alert and oriented to time, place,  and person affect appropriate.  Filed Weights   02/26/22 0500 03/02/22 0500 03/03/22 0142  Weight: 81 kg 80.7 kg 81.6 kg   Vitals:   03/03/22 0503 03/03/22 0753  BP: 126/73 124/75  Pulse: 83 73  Resp: 18 18  Temp: (!) 97.4 F (36.3 C) 98.3 F (36.8 C)  SpO2: 100% 98%    DISCHARGE MEDICATION: Allergies as of 03/03/2022   No Known Allergies      Medication List     STOP taking these medications    amLODipine 10 MG tablet Commonly known as: NORVASC   hydrochlorothiazide 25 MG tablet Commonly known as: HYDRODIURIL       TAKE these medications    aspirin 81 MG chewable tablet Chew 1 tablet (81 mg total) by mouth daily. Start taking on: March 04, 2022   atorvastatin 40 MG tablet Commonly known as: LIPITOR Take 40 mg by mouth daily.   bisacodyl 5 MG EC tablet Commonly known as: DULCOLAX Take 2 tablets (10 mg total) by mouth daily as needed for moderate constipation.   bisacodyl 10 MG suppository Commonly known as: DULCOLAX Place 1 suppository (10 mg total) rectally daily as needed for severe constipation.   carvedilol 25 MG tablet Commonly known as: COREG Take 1 tablet (25 mg total) by mouth 2 (two) times daily with a meal.   cyanocobalamin 1000 MCG tablet Take 1  tablet (1,000 mcg total) by mouth daily. Start taking on: March 04, 2022   folic acid 1 MG tablet Commonly known as: FOLVITE Take 1 tablet (1 mg total) by mouth daily. Start taking on: March 04, 2022   furosemide 40 MG tablet Commonly known as: LASIX Take 1 tablet (40 mg total) by mouth daily.   guaiFENesin-dextromethorphan 100-10 MG/5ML syrup Commonly known as: ROBITUSSIN DM Take 5 mLs by mouth every 4 (four) hours as needed for cough.   iron polysaccharides 150 MG capsule Commonly known as: NIFEREX Take 1 capsule (150 mg total) by mouth daily. Start taking on: March 04, 2022   levETIRAcetam 1000 MG tablet Commonly known as: KEPPRA Take 1 tablet (1,000 mg total) by mouth 2  (two) times daily.   multivitamin with minerals Tabs tablet Take 1 tablet by mouth daily. Start taking on: March 04, 2022   pantoprazole 40 MG tablet Commonly known as: PROTONIX Take 1 tablet (40 mg total) by mouth daily. Start taking on: March 04, 2022   polyethylene glycol 17 g packet Commonly known as: MIRALAX / GLYCOLAX Take 17 g by mouth daily as needed for moderate constipation.   potassium chloride SA 20 MEQ tablet Commonly known as: KLOR-CON M Take 1 tablet (20 mEq total) by mouth daily.   sacubitril-valsartan 49-51 MG Commonly known as: ENTRESTO Take 1 tablet by mouth 2 (two) times daily.   spironolactone 25 MG tablet Commonly known as: ALDACTONE Take 1 tablet (25 mg total) by mouth daily. Start taking on: March 04, 2022   thiamine 100 MG tablet Commonly known as: Vitamin B-1 Take 1 tablet (100 mg total) by mouth daily. Start taking on: March 04, 2022   Vitamin D (Ergocalciferol) 1.25 MG (50000 UNIT) Caps capsule Commonly known as: DRISDOL Take 1 capsule (50,000 Units total) by mouth every 7 (seven) days. Start taking on: March 04, 2022       No Known Allergies Discharge Instructions     Call MD for:  difficulty breathing, headache or visual disturbances   Complete by: As directed    Call MD for:  extreme fatigue   Complete by: As directed    Call MD for:  persistant dizziness or light-headedness   Complete by: As directed    Call MD for:  severe uncontrolled pain   Complete by: As directed    Call MD for:  temperature >100.4   Complete by: As directed    Diet - low sodium heart healthy   Complete by: As directed    Discharge instructions   Complete by: As directed    Follow with PCP, need to seen by an MD in 1 to 2 days, continue to monitor BP and titrate medication accordingly.  Repeat CBC, BMP and Mag level after 1 week.  Repeat iron profile, vitamin B12 and vitamin D level after 3 to 6 months.  Follow-up with cardiology in 2 to 4 weeks  for CHF management and may need ischemic cardiac workup as an outpatient. Follow-up with neurology in 2 to 4 weeks for seizure disorder   Increase activity slowly   Complete by: As directed        The results of significant diagnostics from this hospitalization (including imaging, microbiology, ancillary and laboratory) are listed below for reference.    Significant Diagnostic Studies: ECHOCARDIOGRAM COMPLETE  Result Date: 02/24/2022    ECHOCARDIOGRAM REPORT   Patient Name:   JAYCION TREML Date of Exam: 02/24/2022 Medical Rec #:  [id number]  Height:       71.0 in Accession #:    [id number]     Weight:       173.9 lb Date of Birth:  1950-03-10      BSA:          1.987 m Patient Age:    51 years       BP:           151/93 mmHg Patient Gender: M              HR:           154 bpm. Exam Location:  [facility] Procedure: 2D Echo and Intracardiac Opacification Agent Indications:    elevated troponin  History:        Patient has prior history of Echocardiogram examinations, most                 recent 03/08/2019. Stroke; Risk Factors:Hypertension.  Sonographer:    Cathie Hoops Referring Phys: [id number] DANA G NELSON  Sonographer Comments: Technically difficult study due to poor echo windows. IMPRESSIONS  1. Left ventricular ejection fraction, by estimation, is 30 to 35%. The left ventricle has moderately decreased function. The left ventricle demonstrates regional wall motion abnormalities (see scoring diagram/findings for description). There is moderate asymmetric left ventricular hypertrophy of the basal-septal segment. Left ventricular diastolic parameters are indeterminate.  2. Right ventricular systolic function is normal. The right ventricular size is normal. There is moderately elevated pulmonary artery systolic pressure. The estimated right ventricular systolic pressure is 52.1 mmHg.  3. The mitral valve is normal in structure. Trivial mitral valve regurgitation.  4. The aortic valve is tricuspid. Aortic valve  regurgitation is not visualized. No aortic stenosis is present.  5. The inferior vena cava is normal in size with <50% respiratory variability, suggesting right atrial pressure of 8 mmHg. FINDINGS  Left Ventricle: Left ventricular ejection fraction, by estimation, is 30 to 35%. The left ventricle has moderately decreased function. The left ventricle demonstrates regional wall motion abnormalities. Definity contrast agent was given IV to delineate the left ventricular endocardial borders. The left ventricular internal cavity size was normal in size. There is moderate asymmetric left ventricular hypertrophy of the basal-septal segment. Left ventricular diastolic parameters are indeterminate.  LV Wall Scoring: The mid and distal anterior wall, mid and distal anterior septum, and entire apex are hypokinetic. The antero-lateral wall, inferior wall, posterior wall, basal anteroseptal segment, mid inferoseptal segment, basal anterior segment, and basal inferoseptal segment are normal. Right Ventricle: The right ventricular size is normal. Right vetricular wall thickness was not well visualized. Right ventricular systolic function is normal. There is moderately elevated pulmonary artery systolic pressure. The tricuspid regurgitant velocity is 3.32 m/s, and with an assumed right atrial pressure of 8 mmHg, the estimated right ventricular systolic pressure is 52.1 mmHg. Left Atrium: Left atrial size was normal in size. Right Atrium: Right atrial size was normal in size. Pericardium: There is no evidence of pericardial effusion. Mitral Valve: The mitral valve is normal in structure. Trivial mitral valve regurgitation. Tricuspid Valve: The tricuspid valve is not well visualized. Tricuspid valve regurgitation is trivial. Aortic Valve: The aortic valve is tricuspid. Aortic valve regurgitation is not visualized. No aortic stenosis is present. Aortic valve mean gradient measures 3.0 mmHg. Aortic valve peak gradient measures 5.3  mmHg. Aortic valve area, by VTI measures 2.28 cm. Pulmonic Valve: The pulmonic valve was normal in structure. Pulmonic valve regurgitation is not visualized. No evidence of pulmonic stenosis. Aorta:  The aortic root and ascending aorta are structurally normal, with no evidence of dilitation. Pulmonary Artery: The pulmonary artery is of normal size. Venous: The inferior vena cava is normal in size with less than 50% respiratory variability, suggesting right atrial pressure of 8 mmHg. IAS/Shunts: The interatrial septum was not well visualized.  LEFT VENTRICLE PLAX 2D LVIDd:         4.20 cm      Diastology LVIDs:         3.40 cm      LV e' medial:    13.90 cm/s LV PW:         1.00 cm      LV E/e' medial:  5.7 LV IVS:        1.30 cm      LV e' lateral:   17.20 cm/s LVOT diam:     2.00 cm      LV E/e' lateral: 4.6 LV SV:         35 LV SV Index:   18 LVOT Area:     3.14 cm  LV Volumes (MOD) LV vol d, MOD A2C: 91.2 ml LV vol d, MOD A4C: 118.5 ml LV vol s, MOD A2C: 64.1 ml LV vol s, MOD A4C: 78.0 ml LV SV MOD A2C:     27.1 ml LV SV MOD A4C:     118.5 ml LV SV MOD BP:      31.6 ml RIGHT VENTRICLE RV Basal diam:  3.10 cm RV Mid diam:    2.80 cm RV S prime:     16.60 cm/s TAPSE (M-mode): 1.8 cm LEFT ATRIUM             Index        RIGHT ATRIUM           Index LA diam:        3.40 cm 1.71 cm/m   RA Area:     11.90 cm LA Vol (A2C):   30.5 ml 15.35 ml/m  RA Volume:   24.40 ml  12.28 ml/m LA Vol (A4C):   36.8 ml 18.52 ml/m LA Biplane Vol: 33.6 ml 16.91 ml/m  AORTIC VALVE                    PULMONIC VALVE AV Area (Vmax):    2.32 cm     PV Vmax:       0.95 m/s AV Area (Vmean):   2.22 cm     PV Peak grad:  3.6 mmHg AV Area (VTI):     2.28 cm AV Vmax:           115.00 cm/s AV Vmean:          82.300 cm/s AV VTI:            0.156 m AV Peak Grad:      5.3 mmHg AV Mean Grad:      3.0 mmHg LVOT Vmax:         84.90 cm/s LVOT Vmean:        58.200 cm/s LVOT VTI:          0.113 m LVOT/AV VTI ratio: 0.72  AORTA Ao Root diam: 3.20 cm  MITRAL VALVE               TRICUSPID VALVE MV Area (PHT): 5.54 cm    TR Peak grad:   44.1 mmHg MV Decel Time: 137 msec    TR Vmax:  332.00 cm/s MR Peak grad: 35.4 mmHg MR Vmax:      297.67 cm/s  SHUNTS MV E velocity: 78.60 cm/s  Systemic VTI:  0.11 m MV A velocity: 89.10 cm/s  Systemic Diam: 2.00 cm MV E/A ratio:  0.88 Yvonne Lavone MD Electronically signed by Yvonne Lamarr MD Signature Date/Time: 02/24/2022/3:40:48 PM    Final    DG Chest Port 1 View  Result Date: 02/23/2022 CLINICAL DATA:  Aspiration. EXAM: PORTABLE CHEST 1 VIEW COMPARISON:  02/21/2022 FINDINGS: This is a low volume study. The cardiomediastinal silhouette is unchanged. Endotracheal tube and NG tube have been removed. New LEFT LOWER lung opacity is noted. No pneumothorax or large pleural effusion noted. No acute bony abnormalities are present. IMPRESSION: New LEFT LOWER lung opacity which may represent aspiration, pneumonia and/or atelectasis. Electronically Signed   By: Harmon Pier M.D.   On: 02/23/2022 15:04   DG Ankle Left Port  Result Date: 02/23/2022 CLINICAL DATA:  72 year old male with history of edema in the right ankle. EXAM: PORTABLE LEFT ANKLE - 2 VIEW COMPARISON:  No priors. FINDINGS: Two views of the right ankle demonstrate no acute displaced fracture, subluxation or dislocation. No destructive osseous lesions. Soft tissues are minimally swollen. IMPRESSION: 1. No acute osseous abnormality of the right ankle. Electronically Signed   By: Trudie Reed M.D.   On: 02/23/2022 12:19   MR BRAIN WO CONTRAST  Result Date: 02/21/2022 CLINICAL DATA:  Altered mental status.  Found down. EXAM: MRI HEAD WITHOUT CONTRAST TECHNIQUE: Multiplanar, multiecho pulse sequences of the brain and surrounding structures were obtained without intravenous contrast. COMPARISON:  10/02/2020 FINDINGS: Brain: No acute infarction, hemorrhage, hydrocephalus, extra-axial collection or mass lesion. Siderosis at the posterior left parietal lobe at  the site of remote infarct. There is multifocal hyperintense T2-weighted signal within the periventricular and deep white matter. Generalized volume loss. The midline structures are normal. Vascular: Normal flow voids Skull and upper cervical spine: normal marrow signal. There is a right frontal scalp lipoma. Sinuses/Orbits: Paranasal sinuses are clear. No mastoid effusion. Normal orbits. Other: None IMPRESSION: 1. No acute intracranial abnormality. 2. 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 Electronically Signed   By: Deatra Robinson M.D.   On: 02/21/2022 18:47   DG Chest Port 1 View  Result Date: 02/21/2022 CLINICAL DATA:  Acute respiratory failure with hypoxia. EXAM: PORTABLE CHEST 1 VIEW COMPARISON:  01/31/2022 FINDINGS: Endotracheal tube is 7.2 centimeters above the carina. Enteric tube is in place, tip to the stomach. Heart size is normal. No focal consolidations. Minimal bibasilar subsegmental atelectasis. IMPRESSION: 1. Endotracheal tube is 7.2 centimeters above the carina. 2. Minimal bibasilar subsegmental atelectasis. Electronically Signed   By: Norva Pavlov M.D.   On: 02/21/2022 08:25   Rapid EEG  Result Date: 02/21/2022 Charlsie Quest, MD     02/21/2022  5:02 PM Patient Name: Ceaser Ebeling MRN: [id number] Epilepsy Attending: Charlsie Quest Referring Physician/Provider: Arnaldo Natal, MD Duration: 02/20/2022 2053 to 02/21/2022 1352 Patient history: 72yo M with ams in the setting of seizures. EEG to evaluate for seizure. Level of alertness: comatose AEDs during EEG study: LEV, versed Technical aspects: This EEG was obtained using a 10 lead EEG system positioned circumferentially without any parasagittal coverage (rapid EEG). Computer selected EEG is reviewed as  well as background features and all clinically significant events. Description: EEG showed continuous generalized 3 to 6 Hz theta-delta slowing with overriding 15 to 18 Hz beta activity  distributed symmetrically  and diffusely. Hyperventilation and photic stimulation were not performed. Of note, parts of study were technically difficult due to significant electrode artifact.   ABNORMALITY - Continuous slow, generalized IMPRESSION: This limited ceribell study is suggestive of moderate to severe diffuse encephalopathy, nonspecific etiology but could be secondary to sedation. No seizures or epileptiform discharges were seen throughout the recording. Charlsie Quest   DG Chest Portable 1 View  Result Date: 02/20/2022 CLINICAL DATA:  Respiratory failure EXAM: PORTABLE CHEST 1 VIEW COMPARISON:  3:49 p.m. FINDINGS: Endotracheal tube is seen 5 cm above the carina. Nasogastric tube extends into the expected mid body of the stomach. Lung volumes are small, but are symmetric and are clear. Pulmonary insufflation is stable since prior examination. No pneumothorax or pleural effusion. Cardiac size within normal limits. Pulmonary vascularity is normal. IMPRESSION: 1. Support tubes in appropriate position. 2. Pulmonary hypoinflation. Electronically Signed   By: Helyn Numbers M.D.   On: 02/20/2022 16:55   CT Cervical Spine Wo Contrast  Result Date: 02/20/2022 CLINICAL DATA:  Trauma, found down EXAM: CT CERVICAL SPINE WITHOUT CONTRAST TECHNIQUE: Multidetector CT imaging of the cervical spine was performed without intravenous contrast. Multiplanar CT image reconstructions were also generated. RADIATION DOSE REDUCTION: This exam was performed according to the departmental dose-optimization program which includes automated exposure control, adjustment of the mA and/or kV according to patient size and/or use of iterative reconstruction technique. COMPARISON:  03/07/2019 FINDINGS: Alignment: Alignment of posterior margins of vertebral bodies appears normal. Skull base and vertebrae: No recent fracture is seen. Degenerative changes are noted, more severe at C5-C6 level. Soft tissues and spinal canal: There is  no central spinal stenosis. There is extrinsic pressure over the ventral margin of thecal sac caused by bulging of annulus and bony spurs from C3-C7 levels. There is mild to moderate spinal stenosis at C5-C6 level. Disc levels: There is encroachment of neural foramina by bony spurs from C3-C7 levels. Upper chest: Unremarkable. Other: Endotracheal and enteric tube are noted in place. IMPRESSION: No recent fracture is seen in cervical spine. Cervical spondylosis, more severe at C5-C6 level. Electronically Signed   By: Ernie Avena M.D.   On: 02/20/2022 16:35   CT Head Wo Contrast  Result Date: 02/20/2022 CLINICAL DATA:  Found down, altered mental status EXAM: CT HEAD WITHOUT CONTRAST TECHNIQUE: Contiguous axial images were obtained from the base of the skull through the vertex without intravenous contrast. RADIATION DOSE REDUCTION: This exam was performed according to the departmental dose-optimization program which includes automated exposure control, adjustment of the mA and/or kV according to patient size and/or use of iterative reconstruction technique. COMPARISON:  Previous studies done on 10/02/2020 FINDINGS: Brain: There are no signs of bleeding within the cranium. There is interval appearance of moderate sized area of low-density in left posterior temporoparietal cortex. There is no effacement of adjacent cortical sulci or any significant mass effect. Cortical sulci are prominent. There is decreased density in periventricular white matter. Vascular: Unremarkable. Skull: No fracture is seen in calvarium. There is 1.3 cm area of focal thickening in the soft tissues in right frontal scalp with no significant interval change suggesting possible chronic benign process. Density measurements are suggestive of possible lipoma. Sinuses/Orbits: There is mucosal thickening in ethmoid sinus. Frontal sinus is hypoplastic. Other: Orogastric tube is noted in place. IMPRESSION: There are no signs of bleeding  within the cranium. There is interval appearance of moderate sized area of low-density in the left posterior parietal cortex, possibly old infarct. Less likely possibilities  would include subacute infarct or edema surrounding a space-occupying lesion. Follow-up MRI as clinically warranted may be considered. Atrophy.  Small-vessel disease. Chronic sinusitis.  Possible lipoma in the right frontal scalp. Electronically Signed   By: Ernie AvenaPalani  Rathinasamy M.D.   On: 02/20/2022 16:30   DG Chest Portable 1 View  Result Date: 02/20/2022 CLINICAL DATA:  Post intubation EXAM: PORTABLE CHEST 1 VIEW COMPARISON:  Portable exam 1549 hours compared to 04/04/2019 FINDINGS: Tip of endotracheal tube projects 5.5 cm above carina. Normal heart size, mediastinal contours, and pulmonary vascularity. Lungs clear. No pulmonary infiltrate, pleural effusion, or pneumothorax. Osseous structures unremarkable. IMPRESSION: No acute abnormalities. Electronically Signed   By: Ulyses SouthwardMark  Boles M.D.   On: 02/20/2022 15:59    Microbiology: Recent Results (from the past 240 hour(s))  Culture, blood (Routine X 2) w Reflex to ID Panel     Status: None   Collection Time: 02/24/22  8:52 PM   Specimen: BLOOD  Result Value Ref Range Status   Specimen Description BLOOD LEFT HAND  Final   Special Requests   Final    BOTTLES DRAWN AEROBIC AND ANAEROBIC Blood Culture adequate volume   Culture   Final    NO GROWTH 5 DAYS Performed at [facility], [address] [place], [place] [address]    Report Status 03/01/2022 FINAL  Final  Culture, blood (Routine X 2) w Reflex to ID Panel     Status: None   Collection Time: 02/24/22  8:57 PM   Specimen: BLOOD  Result Value Ref Range Status   Specimen Description BLOOD RIGHT HAND  Final   Special Requests   Final    BOTTLES DRAWN AEROBIC AND ANAEROBIC Blood Culture adequate volume   Culture   Final    NO GROWTH 5 DAYS Performed at [facility], [address] [place],  [place] [address]    Report Status 03/01/2022 FINAL  Final  Gastrointestinal Panel by PCR , Stool     Status: None   Collection Time: 02/27/22  9:01 AM   Specimen: Stool  Result Value Ref Range Status   Campylobacter species NOT DETECTED NOT DETECTED Final   Plesimonas shigelloides NOT DETECTED NOT DETECTED Final   Salmonella species NOT DETECTED NOT DETECTED Final   Yersinia enterocolitica NOT DETECTED NOT DETECTED Final   Vibrio species NOT DETECTED NOT DETECTED Final   Vibrio cholerae NOT DETECTED NOT DETECTED Final   Enteroaggregative E coli (EAEC) NOT DETECTED NOT DETECTED Final   Enteropathogenic E coli (EPEC) NOT DETECTED NOT DETECTED Final   Enterotoxigenic E coli (ETEC) NOT DETECTED NOT DETECTED Final   Shiga like toxin producing E coli (STEC) NOT DETECTED NOT DETECTED Final   Shigella/Enteroinvasive E coli (EIEC) NOT DETECTED NOT DETECTED Final   Cryptosporidium NOT DETECTED NOT DETECTED Final   Cyclospora cayetanensis NOT DETECTED NOT DETECTED Final   Entamoeba histolytica NOT DETECTED NOT DETECTED Final   Giardia lamblia NOT DETECTED NOT DETECTED Final   Adenovirus F40/41 NOT DETECTED NOT DETECTED Final   Astrovirus NOT DETECTED NOT DETECTED Final   Norovirus GI/GII NOT DETECTED NOT DETECTED Final   Rotavirus A NOT DETECTED NOT DETECTED Final   Sapovirus (I, II, IV, and V) NOT DETECTED NOT DETECTED Final    Comment: Performed at [facility], [address] [place], [place] [address]     Labs: CBC: Recent Labs  Lab 02/25/22 0542 02/26/22 0628 02/27/22 0635 02/28/22 0501 03/01/22 0501  WBC 7.8 4.1 4.2 4.6 6.2  HGB 8.2* 8.0* 8.7* 9.0* 9.6*  HCT 24.6* 24.5* 26.2* 26.8* 29.1*  MCV 101.2* 100.4* 99.6 99.3 99.7  PLT 129* 150 174 190 235   Basic Metabolic Panel: Recent Labs  Lab 02/25/22 0542 02/26/22 0628 02/27/22 0635 02/28/22 0501 03/01/22 0501  NA 139 138 138 140 140  K 2.9* 3.8 3.5 3.7 3.9  CL 107 108 105 107 106  CO2 24 24 25 25 24   GLUCOSE 118*  90 88 102* 99  BUN 17 13 12 11 11   CREATININE 1.03 0.83 0.80 0.99 1.14  CALCIUM 8.5* 8.7* 8.9 9.0 9.2  MG 1.7 2.0 1.5*  --   --   PHOS 2.7 2.8 3.4  --   --    Liver Function Tests: No results for input(s): "AST", "ALT", "ALKPHOS", "BILITOT", "PROT", "ALBUMIN" in the last 168 hours. No results for input(s): "LIPASE", "AMYLASE" in the last 168 hours. No results for input(s): "AMMONIA" in the last 168 hours. Cardiac Enzymes: No results for input(s): "CKTOTAL", "CKMB", "CKMBINDEX", "TROPONINI" in the last 168 hours. BNP (last 3 results) Recent Labs    02/23/22 1441  BNP 1,539.0*   CBG: Recent Labs  Lab 02/28/22 2125 02/28/22 2355 03/01/22 0518 03/01/22 0818 03/01/22 1613  GLUCAP 110* 105* 86 83 110*    Time spent: 35 minutes  Signed:  04/30/22  [facility]  03/03/2022 12:09 PM

## 2022-04-02 ENCOUNTER — Encounter: Payer: Self-pay | Admitting: *Deleted

## 2022-04-03 ENCOUNTER — Ambulatory Visit
Admission: RE | Admit: 2022-04-03 | Discharge: 2022-04-03 | Disposition: A | Discharge status: Home / Self Care | Payer: 59 | Source: Ambulatory Visit | Attending: Internal Medicine | Admitting: Internal Medicine

## 2022-04-03 ENCOUNTER — Encounter: Payer: Self-pay | Admitting: Internal Medicine

## 2022-04-03 ENCOUNTER — Ambulatory Visit: Payer: 59 | Attending: Internal Medicine | Admitting: Internal Medicine

## 2022-04-03 VITALS — BP 100/60 | HR 96 | Ht 71.0 in | Wt 174.1 lb

## 2022-04-03 DIAGNOSIS — R053 Chronic cough: Secondary | ICD-10-CM

## 2022-04-03 DIAGNOSIS — I5020 Unspecified systolic (congestive) heart failure: Secondary | ICD-10-CM | POA: Insufficient documentation

## 2022-04-03 DIAGNOSIS — R052 Subacute cough: Secondary | ICD-10-CM

## 2022-04-03 DIAGNOSIS — R0989 Other specified symptoms and signs involving the circulatory and respiratory systems: Secondary | ICD-10-CM | POA: Diagnosis not present

## 2022-04-03 DIAGNOSIS — R0602 Shortness of breath: Secondary | ICD-10-CM

## 2022-04-03 NOTE — Progress Notes (Unsigned)
Follow-up Outpatient Visit Date: 04/03/2022  Primary Care Provider: [facility] [place] [address]  Chief Complaint: Follow-up cardiomyopathy  HPI:  John Spears is a 72 y.o. male with history of hypertension, seizure disorder, and alcohol abuse, who presents for follow-up of incidentally noted cardiomyopathy during hospitalization last month after being found down.  Multiple seizures were noted.  He was initially intubated for airway protection.  Echocardiogram revealed LVEF of 30-35% with diffuse apical as well as mid anterior/anterolateral hypokinesis.  Moderate pulmonary hypertension was also noted with an RVSP of 52 mmHg.  He denied symptoms at that time but was felt to be a poor historian.  Ischemia evaluation was deferred in favor of gentle diuresis and initiation of goal-directed medical therapy.  He was discharged on aspirin, atorvastatin, carvedilol, potassium chloride, furosemide, Entresto, and spironolactone.  Today, John Spears is without complains, denying chest pain, shortness of breath, palpitations, lightheadedness, and edema.  He has not had any falls or seizures.  He continues to drink, approximately a pint of liquor per week (his sister, who accompanies him today, thinks that he drinks more).  He reports being compliant with his medications.  His only complaint is of persistent chest congestion and non-productive cough, as well as nasal drainage.  Nose spray was recommended by his PCP, though he has not been using this.  --------------------------------------------------------------------------------------------------  Past Medical History:  Diagnosis Date   Alcohol abuse    HFrEF (heart failure with reduced ejection fraction) ([place]) 02/2022   LVEF 30-35%   Hypertension    Past Surgical History:  Procedure Laterality Date   JOINT REPLACEMENT      Current Meds  Medication Sig   aspirin 81 MG chewable tablet Chew 1  tablet (81 mg total) by mouth daily.   atorvastatin (LIPITOR) 40 MG tablet Take 40 mg by mouth daily.   bisacodyl (DULCOLAX) 5 MG EC tablet Take 2 tablets (10 mg total) by mouth daily as needed for moderate constipation.   carvedilol (COREG) 25 MG tablet Take 1 tablet (25 mg total) by mouth 2 (two) times daily with a meal.   [EXPIRED] folic acid (FOLVITE) 1 MG tablet Take 1 tablet (1 mg total) by mouth daily.   furosemide (LASIX) 40 MG tablet Take 1 tablet (40 mg total) by mouth daily.   levETIRAcetam (KEPPRA) 1000 MG tablet Take 1 tablet (1,000 mg total) by mouth 2 (two) times daily.   omeprazole (PRILOSEC) 20 MG capsule Take 20 mg by mouth daily.   potassium chloride SA (KLOR-CON M) 20 MEQ tablet Take 1 tablet (20 mEq total) by mouth daily.   sacubitril-valsartan (ENTRESTO) 49-51 MG Take 1 tablet by mouth 2 (two) times daily.   spironolactone (ALDACTONE) 25 MG tablet Take 1 tablet (25 mg total) by mouth daily.    Allergies: Patient has no known allergies.  Social History   Tobacco Use   Smoking status: Former   Smokeless tobacco: Never  Substance Use Topics   Alcohol use: Not Currently    Comment: Pint of liquor week   Drug use: Not Currently    Types: Marijuana    Family History  Problem Relation Age of Onset   Heart attack Mother 46    Review of Systems: A 12-system review of systems was performed and was negative except as noted in the HPI.  --------------------------------------------------------------------------------------------------  Physical Exam: BP 100/60 (BP Location: Left Arm, Patient Position: Sitting, Cuff Size: Normal)   Pulse 96  Ht 5' 11"$  (1.803 m)   Wt 174 lb 2 oz (79 kg)   SpO2 96%   BMI 24.29 kg/m   General:  NAD.  Flat affect.  Accompanied by his sister. Neck: No JVD or HJR. Lungs: Diminished breath sounds at both lung bases without wheezes or crackles. Heart: Regular rate and rhythm without murmurs, rubs, or gallops. Abdomen: Soft,  nontender, nondistended. Extremities: No lower extremity edema.  EKG:  Normal sinus rhythm with 1st degree AV block (PR interval 210 ms), left axis deviation, poor R wave progression, and nonspecific T wave changes.  Anterolateral T wave inversion have resolved since prior tracing on 02/25/2022.  Lab Results  Component Value Date   WBC 6.2 03/01/2022   HGB 9.6 (L) 03/01/2022   HCT 29.1 (L) 03/01/2022   MCV 99.7 03/01/2022   PLT 235 03/01/2022    Lab Results  Component Value Date   NA 140 03/01/2022   K 3.9 03/01/2022   CL 106 03/01/2022   CO2 24 03/01/2022   BUN 11 03/01/2022   CREATININE 1.14 03/01/2022   GLUCOSE 99 03/01/2022   ALT 20 02/20/2022    Lab Results  Component Value Date   CHOL 254 (H) 03/08/2019   HDL 111 03/08/2019   LDLCALC 133 (H) 03/08/2019   TRIG 51 03/08/2019   CHOLHDL 2.3 03/08/2019    --------------------------------------------------------------------------------------------------  ASSESSMENT AND PLAN: HFrEF: Chronicity uncertain, though LVEF was normal in 02/2019.  He appears euvolemic at this time and is asymptomatic, though he is not particularly active.  Recent labs by his PCP showed normal creatinine, sodium, and potassium.  We will continue his current GDMT; borderline low BP precludes escalation today.  We will obtain a CXR today, given persistent cough/chest congestion, as well as a limited echo to reasess LVEF in ~2 weeks. I suspect that his cardiomyopathy could have been stress-induced due to his acute illness leading to hospitalization in late December.  Based on results of echo and symptoms going forward, we will need to readdress ischemia evaluation at follow-up.  I encouraged John Spears to cut down/stop his alcohol consumption.  Cough and chest congestion: Suspect this is related to aspiration PNA during recent hospitalization.  We will obtain a PA/lateral CXR for further evaluation.  I have encouraged use of nasal spray recommended by PCP,  as well as OTC guaifenesin.  Follow-up: Return to clinic in ~1 months.  Nelva Bush, MD 04/04/2022 2:10 PM

## 2022-04-03 NOTE — Patient Instructions (Addendum)
Medication Instructions:  Your Physician recommend you continue on your current medication as directed.    Ok to take Mucinex ER   *If you need a refill on your cardiac medications before your next appointment, please call your pharmacy*   Lab Work: None ordered today   Testing/Procedures: Your physician has requested that you have an limited echocardiogram. Echocardiography is a painless test that uses sound waves to create images of your heart. It provides your doctor with information about the size and shape of your heart and how well your heart's chambers and valves are working.   You may receive an ultrasound enhancing agent through an IV if needed to better visualize your heart during the echo. This procedure takes approximately one hour.  There are no restrictions for this procedure.  This will take place at [facility] [id number], [place]   [place]: At [facility], you and your health needs are our priority.  As part of our continuing mission to provide you with exceptional heart care, we have created designated Provider Care Teams.  These Care Teams include your primary Cardiologist (physician) and Advanced Practice Providers (APPs -  Physician Assistants and Nurse Practitioners) who all work together to provide you with the care you need, when you need it.  We recommend signing up for the patient portal called "MyChart".  Sign up information is provided on this After Visit Summary.  MyChart is used to connect with patients for Virtual Visits (Telemedicine).  Patients are able to view lab/test results, encounter notes, upcoming appointments, etc.  Non-urgent messages can be sent to your provider as well.   To learn more about what you can do with MyChart, go to [email or web address].    Your next appointment:   1 month(s)  Provider:   You may see Nelva Bush, MD or one of the following Advanced  Practice Providers on your designated Care Team:   Murray Hodgkins, NP Christell Faith, PA-C Cadence Kathlen Mody, PA-C Gerrie Nordmann, NP

## 2022-04-04 ENCOUNTER — Encounter: Payer: Self-pay | Admitting: Internal Medicine

## 2022-04-04 DIAGNOSIS — I5020 Unspecified systolic (congestive) heart failure: Secondary | ICD-10-CM | POA: Insufficient documentation

## 2022-04-04 DIAGNOSIS — R053 Chronic cough: Secondary | ICD-10-CM | POA: Insufficient documentation

## 2022-04-04 DIAGNOSIS — R0989 Other specified symptoms and signs involving the circulatory and respiratory systems: Secondary | ICD-10-CM | POA: Insufficient documentation

## 2022-05-20 ENCOUNTER — Ambulatory Visit: Payer: 59

## 2022-05-26 ENCOUNTER — Encounter: Payer: Self-pay | Admitting: Cardiology

## 2022-05-26 ENCOUNTER — Ambulatory Visit: Payer: 59 | Attending: Cardiology | Admitting: Cardiology

## 2022-05-26 VITALS — BP 98/60 | HR 79 | Ht 71.0 in | Wt 167.5 lb

## 2022-05-26 DIAGNOSIS — R569 Unspecified convulsions: Secondary | ICD-10-CM | POA: Diagnosis not present

## 2022-05-26 DIAGNOSIS — I639 Cerebral infarction, unspecified: Secondary | ICD-10-CM

## 2022-05-26 DIAGNOSIS — I5020 Unspecified systolic (congestive) heart failure: Secondary | ICD-10-CM | POA: Diagnosis not present

## 2022-05-26 DIAGNOSIS — F1010 Alcohol abuse, uncomplicated: Secondary | ICD-10-CM | POA: Diagnosis not present

## 2022-05-26 NOTE — Patient Instructions (Signed)
Medication Instructions:  Your physician recommends that you continue on your current medications as directed. Please refer to the Current Medication list given to you today.  *If you need a refill on your cardiac medications before your next appointment, please call your pharmacy*   Lab Work: BMP to be drawn prior to follow-up appointment - Please go to the [facility]. You will check in at the front desk to the right as you walk into the atrium. Valet Parking is offered if needed. - No appointment needed. You may go any day between 7 am and 6 pm.  If you have labs (blood work) drawn today and your tests are completely normal, you will receive your results only by: [place] (if you have MyChart) OR A paper copy in the mail If you have any lab test that is abnormal or we need to change your treatment, we will call you to review the results.   Testing/Procedures: No testing ordered  Follow-Up: At [facility], you and your health needs are our priority.  As part of our continuing mission to provide you with exceptional heart care, we have created designated Provider Care Teams.  These Care Teams include your primary Cardiologist (physician) and Advanced Practice Providers (APPs -  Physician Assistants and Nurse Practitioners) who all work together to provide you with the care you need, when you need it.  We recommend signing up for the patient portal called "MyChart".  Sign up information is provided on this After Visit Summary.  MyChart is used to connect with patients for Virtual Visits (Telemedicine).  Patients are able to view lab/test results, encounter notes, upcoming appointments, etc.  Non-urgent messages can be sent to your provider as well.   To learn more about what you can do with MyChart, go to [email or web address].    Your next appointment:   2 month(s)  Provider:   You may see Nelva Bush, MD or one of the following Advanced Practice Providers  on your designated Care Team:   Murray Hodgkins, NP Christell Faith, PA-C Cadence Kathlen Mody, PA-C Gerrie Nordmann, NP

## 2022-05-26 NOTE — Progress Notes (Signed)
Cardiology Office Note:   Date:  05/26/2022  ID:  John Spears, DOB 1950-02-25, MRN [id number]  History of Present Illness:   John Spears is a 72 y.o. male with a past medical history of hypertension, seizure disorder, alcohol abuse, HFrEF, presented for follow-up.  He was last seen in clinic 04/03/2022 after recent hospitalization with incidental finding of a cardiomyopathy after being found down.  Multiple seizures were noted.  He was initially intubated for airway protection.  Echocardiogram demonstrated an LVEF of 30 to 25% with diffuse apical as well as mid anterior/anterolateral hypokinesis.  Moderate pulmonary hypertension was also noted with an RVSP of 52 mmHg.  He was discharged on aspirin, atorvastatin, carvedilol, potassium chloride, furosemide, Entresto, and spironolactone.  During this visit he was with his only complaint being persistent chest congestion and nonproductive cough as well as nasal congestion.  He was scheduled for blood work and a limited prescription.  He was also sent for chest x-ray and encouraged to use nasal spray and over-the-counter guaifenesin.  He returns to clinic today accompanied by his sister today. He states that he has been doing well. He has been taking his medications as prescribed. Per his sister the medications come pill packed so he is unaware of the current medications that he is on. He denies any chest pain, shortness of breath, or peripheral edema. States that he had been sleeping well without issues but his appetite is decreased not from SOB or fluid but from just having a decreased appetite.He denies any seizure activity or falls. He denies any recent hospitalizations or visits to the emergency department.   ROS: 10 point review of system is negative with exception of what has been listed and in the HPI.  Studies Reviewed:    EKG: No new tracings were completed today  TTE 02/24/22 1. Left ventricular ejection fraction, by estimation, is 30 to 35%.  The  left ventricle has moderately decreased function. The left ventricle  demonstrates regional wall motion abnormalities (see scoring  diagram/findings for description). There is  moderate asymmetric left ventricular hypertrophy of the basal-septal  segment. Left ventricular diastolic parameters are indeterminate.   2. Right ventricular systolic function is normal. The right ventricular  size is normal. There is moderately elevated pulmonary artery systolic  pressure. The estimated right ventricular systolic pressure is 123XX123 mmHg.   3. The mitral valve is normal in structure. Trivial mitral valve  regurgitation.   4. The aortic valve is tricuspid. Aortic valve regurgitation is not  visualized. No aortic stenosis is present.   5. The inferior vena cava is normal in size with <50% respiratory  variability, suggesting right atrial pressure of 8 mmHg.   Risk Assessment/Calculations:              Physical Exam:   VS:  BP 98/60 (BP Location: Left Arm, Patient Position: Sitting, Cuff Size: Normal)   Pulse 79   Ht 5\' 11"  (1.803 m)   Wt 167 lb 8 oz (76 kg)   SpO2 97%   BMI 23.36 kg/m    Wt Readings from Last 3 Encounters:  05/26/22 167 lb 8 oz (76 kg)  04/03/22 174 lb 2 oz (79 kg)  03/03/22 179 lb 14.3 oz (81.6 kg)     GEN: Well nourished, well developed in no acute distress NECK: No JVD; No carotid bruits CARDIAC: RRR, no murmurs, rubs, gallops RESPIRATORY:  Clear to auscultation without rales, wheezing or rhonchi  ABDOMEN: Soft, non-tender, non-distended EXTREMITIES:  No edema;  No deformity   ASSESSMENT AND PLAN:   HFrEF with last LVEF being normal in 2021.  During recent hospitalization he was found to have an LVEF of 30-35%.  This was an incidental finding and he was asymptomatic.  He appears euvolemic on exam.  Will continue to work on escalating GDMT however we are limited with with lower blood pressure.  He has been scheduled for a repeat limited echocardiogram to  reevaluate his cardiomyopathy, thought to be likely stress-induced due to his acute illness, ETOH abuse, leading to his hospitalization late December.  He will also continue to need an ischemic workup.  They would like to defer any further testing at this time until after his echocardiogram has been completed to cut down on cost.  He has been continued on carvedilol 25 mg twice daily, furosemide 40 mg daily, and Entresto 49/51 mg twice daily and spironolactone 25 mg daily.  He was not thought ot be a good candidate for a SGLT2i due to obesity, sedentary state, and history of medical noncompliance. He was scheduled for a follow-up and bmet today as well.  History of CVA.  He is continued on aspirin and atorvastatin.  Continues to be followed by neurology.  Seizure disorder where he is continued on Keppra that continues to be followed by neurology as well.  Alcohol abuse weight has been encouraged to decrease the amount of alcohol that he has.  Previously he was taking greater than 6 glasses of whiskey daily.  Disposition patient to return to clinic to see MD/APP in 2 months or sooner if needed after echocardiogram has been completed and to revisit continued ischemic evaluation.    Signed, Tj Kitchings, NP

## 2022-06-01 ENCOUNTER — Emergency Department: Payer: 59

## 2022-06-01 ENCOUNTER — Encounter: Payer: Self-pay | Admitting: Emergency Medicine

## 2022-06-01 ENCOUNTER — Emergency Department
Admission: EM | Admit: 2022-06-01 | Discharge: 2022-06-01 | Disposition: A | Discharge status: Home / Self Care | Payer: 59 | Attending: Emergency Medicine | Admitting: Emergency Medicine

## 2022-06-01 DIAGNOSIS — E875 Hyperkalemia: Secondary | ICD-10-CM | POA: Diagnosis not present

## 2022-06-01 DIAGNOSIS — R1013 Epigastric pain: Secondary | ICD-10-CM | POA: Diagnosis present

## 2022-06-01 DIAGNOSIS — N179 Acute kidney failure, unspecified: Secondary | ICD-10-CM | POA: Insufficient documentation

## 2022-06-01 DIAGNOSIS — I110 Hypertensive heart disease with heart failure: Secondary | ICD-10-CM | POA: Insufficient documentation

## 2022-06-01 DIAGNOSIS — I5043 Acute on chronic combined systolic (congestive) and diastolic (congestive) heart failure: Secondary | ICD-10-CM | POA: Insufficient documentation

## 2022-06-01 LAB — COMPREHENSIVE METABOLIC PANEL
ALT: 8 U/L (ref 0–44)
AST: 16 U/L (ref 15–41)
Albumin: 3.7 g/dL (ref 3.5–5.0)
Alkaline Phosphatase: 60 U/L (ref 38–126)
Anion gap: 6 (ref 5–15)
BUN: 61 mg/dL — ABNORMAL HIGH (ref 8–23)
CO2: 16 mmol/L — ABNORMAL LOW (ref 22–32)
Calcium: 9.7 mg/dL (ref 8.9–10.3)
Chloride: 110 mmol/L (ref 98–111)
Creatinine, Ser: 2.31 mg/dL — ABNORMAL HIGH (ref 0.61–1.24)
GFR, Estimated: 30 mL/min — ABNORMAL LOW (ref >60)
Glucose, Bld: 95 mg/dL (ref 70–99)
Potassium: 5.6 mmol/L — ABNORMAL HIGH (ref 3.5–5.1)
Sodium: 132 mmol/L — ABNORMAL LOW (ref 135–145)
Total Bilirubin: 0.7 mg/dL (ref 0.3–1.2)
Total Protein: 7.1 g/dL (ref 6.5–8.1)

## 2022-06-01 LAB — URINALYSIS, ROUTINE W REFLEX MICROSCOPIC
Bilirubin Urine: NEGATIVE
Glucose, UA: NEGATIVE mg/dL
Hgb urine dipstick: NEGATIVE
Ketones, ur: NEGATIVE mg/dL
Leukocytes,Ua: NEGATIVE
Nitrite: NEGATIVE
Protein, ur: NEGATIVE mg/dL
Specific Gravity, Urine: 1.013 (ref 1.005–1.030)
pH: 5 (ref 5.0–8.0)

## 2022-06-01 LAB — CBC
HCT: 31.3 % — ABNORMAL LOW (ref 39.0–52.0)
Hemoglobin: 10 g/dL — ABNORMAL LOW (ref 13.0–17.0)
MCH: 28.8 pg (ref 26.0–34.0)
MCHC: 31.9 g/dL (ref 30.0–36.0)
MCV: 90.2 fL (ref 80.0–100.0)
Platelets: 167 10*3/uL (ref 150–400)
RBC: 3.47 MIL/uL — ABNORMAL LOW (ref 4.22–5.81)
RDW: 14.5 % (ref 11.5–15.5)
WBC: 7.1 10*3/uL (ref 4.0–10.5)
nRBC: 0 % (ref 0.0–0.2)

## 2022-06-01 LAB — LIPASE, BLOOD: Lipase: 48 U/L (ref 11–51)

## 2022-06-01 MED ORDER — SODIUM ZIRCONIUM CYCLOSILICATE 10 G PO PACK
10.0000 g | PACK | Freq: Once | ORAL | Status: AC
  Administered 2022-06-01: 10 g via ORAL
  Filled 2022-06-01: qty 1

## 2022-06-01 MED ORDER — OMEPRAZOLE 40 MG PO CPDR: 40.0000 mg | DELAYED_RELEASE_CAPSULE | Freq: Every day | ORAL | 0 refills | Status: DC

## 2022-06-01 MED ORDER — LACTATED RINGERS IV BOLUS
500.0000 mL | Freq: Once | INTRAVENOUS | Status: AC
  Administered 2022-06-01: 500 mL via INTRAVENOUS

## 2022-06-01 NOTE — ED Triage Notes (Signed)
Pt with sister in triage who reports pt waking this AM with abdominal pain that he sts he has had x2 days. Pt denies N/V/D. When pt asked about diarrhea, pt sts, "that's the problem, I can't go." Last known BM on 4/7 but pt sts it was not much.

## 2022-06-01 NOTE — ED Provider Notes (Signed)
[facility] Provider Note    Event Date/Time   First MD Initiated Contact with Patient 06/01/22 0410     (approximate)   History   Abdominal Pain   HPI  John Spears is a 72 y.o. male past medical history of heart failure with reduced EF, hypertension alcohol use disorder who presents with abdominal pain.  Patient tells me that for several days he has had intermittent abdominal pain.  Tells me it is all over.  When asked to point where it is worse he points to the epigastric region.  Denies nausea vomiting.  Says he has not had BM in several days but his sister who is in the room says that she hears him have bowel movement and it looks black.  He is denying fevers chills.  Pain is intermittent without clear exacerbating alleviating factor.  Denies pain currently.  Patient does continue to drink alcohol about 2 beers per day with last drink earlier this evening.     Past Medical History:  Diagnosis Date   Alcohol abuse    HFrEF (heart failure with reduced ejection fraction) 02/2022   LVEF 30-35%   Hypertension     Patient Active Problem List   Diagnosis Date Noted   HFrEF (heart failure with reduced ejection fraction) 04/04/2022   Chest congestion 04/04/2022   Chronic cough 04/04/2022   Dilated cardiomyopathy 02/26/2022   Acute on chronic combined systolic and diastolic CHF (congestive heart failure) 02/26/2022   Alcohol abuse 02/26/2022   Encephalopathy 02/26/2022   Seizures 02/20/2022   Hypomagnesemia    Hypokalemia    Transaminitis    Traumatic rhabdomyolysis    Thrombocytopenia    Weakness    Lower extremity weakness 03/07/2019   Cerebrovascular accident (CVA) 04/05/2016     Physical Exam  Triage Vital Signs: ED Triage Vitals  Enc Vitals Group     BP 06/01/22 0243 112/67     Pulse Rate 06/01/22 0243 73     Resp 06/01/22 0256 16     Temp 06/01/22 0243 97.6 F (36.4 C)     Temp Source 06/01/22 0243 Oral     SpO2 06/01/22 0243 98 %      Weight --      Height --      Head Circumference --      Peak Flow --      Pain Score 06/01/22 0253 7     Pain Loc --      Pain Edu? --      Excl. in GC? --     Most recent vital signs: Vitals:   06/01/22 0430 06/01/22 0600  BP: 109/64   Pulse: 62 64  Resp:  19  Temp:    SpO2: 100% 100%     General: Awake, no distress.  Mucous membranes are moist CV:  Good peripheral perfusion.  Resp:  Normal effort.  Abd:  No distention.  Abdomen is soft and nontender throughout Neuro:             Awake, Alert, Oriented x 3  Other:  No peripheral edema Brown stool on rectal exam  ED Results / Procedures / Treatments  Labs (all labs ordered are listed, but only abnormal results are displayed) Labs Reviewed  COMPREHENSIVE METABOLIC PANEL - Abnormal; Notable for the following components:      Result Value   Sodium 132 (*)    Potassium 5.6 (*)    CO2 16 (*)    BUN 61 (*)  Creatinine, Ser 2.31 (*)    GFR, Estimated 30 (*)    All other components within normal limits  CBC - Abnormal; Notable for the following components:   RBC 3.47 (*)    Hemoglobin 10.0 (*)    HCT 31.3 (*)    All other components within normal limits  URINALYSIS, ROUTINE W REFLEX MICROSCOPIC - Abnormal; Notable for the following components:   Color, Urine YELLOW (*)    APPearance CLEAR (*)    All other components within normal limits  LIPASE, BLOOD     EKG     RADIOLOGY I reviewed and interpreted the CT of the abdomen pelvis which does show some fluid-filled bowel but no obstruction or other acute surgical process   PROCEDURES:  Critical Care performed: No  .1-3 Lead EKG Interpretation  Performed by: Georga Hacking, MD Authorized by: Georga Hacking, MD     Interpretation: normal     ECG rate assessment: normal     Rhythm: sinus rhythm     Ectopy: none     Conduction: normal     The patient is on the cardiac monitor to evaluate for evidence of arrhythmia and/or significant heart  rate changes.   MEDICATIONS ORDERED IN ED: Medications  lactated ringers bolus 500 mL (0 mLs Intravenous Stopped 06/01/22 0615)  sodium zirconium cyclosilicate (LOKELMA) packet 10 g (10 g Oral Given 06/01/22 0556)     IMPRESSION / MDM / ASSESSMENT AND PLAN / ED COURSE  I reviewed the triage vital signs and the nursing notes.                              Patient's presentation is most consistent with acute complicated illness / injury requiring diagnostic workup.  Differential diagnosis includes, but is not limited to, gastritis, peptic ulcer, pancreatitis, biliary colic, bowel obstruction  Patient is a 72 year old male history of heart failure with EF of 30% alcohol use disorder who presents with abdominal pain.  Tells me symptoms are going on for several days and is diffuse associated with decreased appetite as well as constipation.  Patient is somewhat of a poor historian and is not able to elaborate much of the symptoms.  He is here with his sister who tells me that she thinks his stool looks black.  He does drink chronically about 2 beers per day he tells me and his still drinking.  Patient's blood pressures on the soft side 96/73.  He looks nontoxic abdomen is benign at the time my exam he is not having pain.  Heart rate 50s. He has brown stool on rectal exam.   Lab work is notable for an AKI creatinine 2.3 from baseline of around 1.16, BUN elevated at 61.  Bicarb is also 16 but no anion gap.  Lipase is normal as are LFTs.  Calcium elevated at 5.6.  [place]  is negative for ketones.  CBC with stable anemia hemoglobin actually higher than past 10.  No leukocytosis.  Plan to obtain a CT of the abdomen pelvis.  In terms of patient's AKI and hyperkalemia unclear exactly what the cause is.  He has had decreased p.o. intake over the last several days and with elevated BUN I suspect this is likely prerenal.  He does not have any peripheral edema does not look volume overloaded do not think it is  related to congestion.  I do think he can handle 500 cc fluid bolus.  He is on Entresto and spironolactone.  These will likely need to be held over the next several days.   Patient's CT abdomen pelvis does not have any acute surgical findings.  Does have findings that suggest gastritis as well as some fluid-filled bowels but no evidence of SBO.  On repeat examination patient is denying any pain currently. He is tolerating PO.    I had a long discussion with the patient's sister about his medications.  Given the AKI and hyperkalemia I want him to hold his potassium supplement Entresto and spironolactone.  Patient has his medications mixed together in the bubble container and so his sister is not sure which medications are which.  I recommended that they take this to the pharmacy who can help him identify which medications to hold.  I am also increasing his dose of omeprazole from 20 to 40mg  given the likely gastritis on CT.  We discussed that he will need to have a repeat BMP in about 5 to 7 days.  Also discussed low potassium diet.  Encouraged p.o. hydration.      FINAL CLINICAL IMPRESSION(S) / ED DIAGNOSES   Final diagnoses:  AKI (acute kidney injury)  Hyperkalemia     Rx / DC Orders   ED Discharge Orders          Ordered    omeprazole (PRILOSEC) 40 MG capsule  Daily        06/01/22 0625             Note:  This document was prepared using Dragon voice recognition software and may include unintentional dictation errors.   Georga HackingMcHugh, Blimie Vaness Rose, MD 06/01/22 [phone]

## 2022-06-01 NOTE — Discharge Instructions (Addendum)
You have an acute injury to your kidney function and your potassium was also elevated.  This may be from not eating and drinking enough.  Please make sure you are staying hydrated.   It is very important that you STOP taking the potassium supplement, I would also like you to STOP taking your Entresto and Aldactone for the next week, until you have had your kidney function and potassium rechecked.  Please review the information above about a low potassium diet during this time.  Please start taking the omeprazole 40 mg/day (this is in place of the 20 mg omeprazole which you already prescribed ) which will help with acid production, as it does look like you have inflammation of the stomach lining on your CAT scan today.  Please see your primary care doctor in about 5 to 7 days to have your electrolytes including your potassium and your kidney function rechecked.

## 2022-06-04 ENCOUNTER — Encounter: Payer: Self-pay | Admitting: Physician Assistant

## 2022-06-05 ENCOUNTER — Other Ambulatory Visit: Payer: Self-pay | Admitting: Physician Assistant

## 2022-06-05 DIAGNOSIS — Z87891 Personal history of nicotine dependence: Secondary | ICD-10-CM

## 2022-06-15 ENCOUNTER — Ambulatory Visit: Payer: 59

## 2022-06-18 ENCOUNTER — Ambulatory Visit: Payer: 59

## 2022-06-22 ENCOUNTER — Telehealth: Payer: Self-pay | Admitting: *Deleted

## 2022-06-22 NOTE — Telephone Encounter (Signed)
-----   Message from Yvonne Wanda, MD sent at 06/20/2022  8:15 AM EDT ----- Regarding: Follow-up labs Good morning,  Mr. Grunewald is coming in for on echo on 5/1.  His PCP has asked that we recheck labs due to recent AKI that prompted discontinuation of some of his HF meds.  Can we draw a BMP when he comes in for his echo this week?  Please let me know if any questions or concerns come up.  Thanks.  Thayer Ohm

## 2022-06-23 ENCOUNTER — Other Ambulatory Visit: Payer: Self-pay

## 2022-06-23 ENCOUNTER — Other Ambulatory Visit: Payer: Self-pay | Admitting: Internal Medicine

## 2022-06-23 DIAGNOSIS — R052 Subacute cough: Secondary | ICD-10-CM

## 2022-06-23 DIAGNOSIS — I5020 Unspecified systolic (congestive) heart failure: Secondary | ICD-10-CM

## 2022-06-23 DIAGNOSIS — I639 Cerebral infarction, unspecified: Secondary | ICD-10-CM

## 2022-06-23 DIAGNOSIS — G9340 Encephalopathy, unspecified: Secondary | ICD-10-CM

## 2022-06-23 DIAGNOSIS — R0989 Other specified symptoms and signs involving the circulatory and respiratory systems: Secondary | ICD-10-CM

## 2022-06-23 DIAGNOSIS — I5043 Acute on chronic combined systolic (congestive) and diastolic (congestive) heart failure: Secondary | ICD-10-CM

## 2022-06-23 NOTE — Telephone Encounter (Signed)
Spoke to patient's [facility] and sister Darrin Luis and informed her to have the patient stop by the lab prior to getting echocardiogram 06/24/22. EC understood with read back.

## 2022-06-24 ENCOUNTER — Other Ambulatory Visit: Payer: Self-pay | Admitting: *Deleted

## 2022-06-24 ENCOUNTER — Ambulatory Visit: Payer: 59 | Attending: Internal Medicine

## 2022-06-24 ENCOUNTER — Other Ambulatory Visit
Admission: RE | Admit: 2022-06-24 | Discharge: 2022-06-24 | Disposition: A | Discharge status: Home / Self Care | Payer: 59 | Source: Ambulatory Visit | Attending: Cardiology | Admitting: Cardiology

## 2022-06-24 DIAGNOSIS — I5020 Unspecified systolic (congestive) heart failure: Secondary | ICD-10-CM

## 2022-06-24 LAB — BASIC METABOLIC PANEL
Anion gap: 9 (ref 5–15)
BUN: 40 mg/dL — ABNORMAL HIGH (ref 8–23)
CO2: 20 mmol/L — ABNORMAL LOW (ref 22–32)
Calcium: 10 mg/dL (ref 8.9–10.3)
Chloride: 110 mmol/L (ref 98–111)
Creatinine, Ser: 1.66 mg/dL — ABNORMAL HIGH (ref 0.61–1.24)
GFR, Estimated: 44 mL/min — ABNORMAL LOW (ref >60)
Glucose, Bld: 111 mg/dL — ABNORMAL HIGH (ref 70–99)
Potassium: 4.8 mmol/L (ref 3.5–5.1)
Sodium: 139 mmol/L (ref 135–145)

## 2022-06-24 LAB — ECHOCARDIOGRAM LIMITED
Area-P 1/2: 3.08 cm2
S' Lateral: 2.4 cm
Single Plane A4C EF: 54 %

## 2022-06-25 NOTE — Progress Notes (Signed)
Kidney function is slowly improving. Electrolytes are stable. Continue current medication regimen without changes.

## 2022-07-27 ENCOUNTER — Ambulatory Visit: Payer: 59 | Attending: Cardiology | Admitting: Cardiology

## 2022-07-27 NOTE — Progress Notes (Deleted)
  Cardiology Office Note:   Date:  07/27/2022  ID:  John Spears, DOB May 02, 1950, MRN [id number] PCP: [facility]  [facility] Providers Cardiologist:  Yvonne Smayan, MD { Click to update primary MD,subspecialty MD or APP then REFRESH:1}   History of Present Illness:   John Spears is a 73 y.o. male with a past medical history of hypertension, seizure disorder, alcohol abuse, HFrEF, CVA, who presented today for follow-up.  Recent hospitalization with incidental finding of a cardiac myopathy after being found down.  Multiple seizures were noted.  He had to be intubated for airway protection.  Echocardiogram demonstrated LVEF 30-35% diffuse apical as well as mild anterior/anterolateral hypokinesis.  Moderate pulmonary hypertension was also noted.  He was subsequently discharged on aspirin, atorvastatin, carvedilol, potassium chloride, furosemide, Entresto, and spironolactone.  He was last seen in clinic 05/26/2022 at that time he stated he had been doing fairly well.  He had been taking his medications as prescribed.  Escalating GDMT was limited to and due to blood pressure.  He was scheduled for limited echocardiogram to evaluate evaluate his cardiomyopathy although it was likely thought to be stress-induced due to his acute illness, EtOH abuse.  He was trying to cut down on the amount of testing that he was undergoing to keep his calls to down at the time.  He returns clinic today  ROS: ***  Studies Reviewed:    EKG:  ***  TTE 02/24/22 1. Left ventricular ejection fraction, by estimation, is 30 to 35%. The  left ventricle has moderately decreased function. The left ventricle  demonstrates regional wall motion abnormalities (see scoring  diagram/findings for description). There is  moderate asymmetric left ventricular hypertrophy of the basal-septal  segment. Left ventricular diastolic parameters are indeterminate.   2. Right ventricular systolic function  is normal. The right ventricular  size is normal. There is moderately elevated pulmonary artery systolic  pressure. The estimated right ventricular systolic pressure is 52.1 mmHg.   3. The mitral valve is normal in structure. Trivial mitral valve  regurgitation.   4. The aortic valve is tricuspid. Aortic valve regurgitation is not  visualized. No aortic stenosis is present.   5. The inferior vena cava is normal in size with <50% respiratory  variability, suggesting right atrial pressure of 8 mmHg.   Risk Assessment/Calculations:   {Does this patient have ATRIAL FIBRILLATION?:[id number]} No BP recorded.  {Refresh Note OR Click here to enter BP  :1}***        Physical Exam:   VS:  There were no vitals taken for this visit.   Wt Readings from Last 3 Encounters:  05/26/22 167 lb 8 oz (76 kg)  04/03/22 174 lb 2 oz (79 kg)  03/03/22 179 lb 14.3 oz (81.6 kg)     GEN: Well nourished, well developed in no acute distress NECK: No JVD; No carotid bruits CARDIAC: ***RRR, no murmurs, rubs, gallops RESPIRATORY:  Clear to auscultation without rales, wheezing or rhonchi  ABDOMEN: Soft, non-tender, non-distended EXTREMITIES:  No edema; No deformity   ASSESSMENT AND PLAN:   ***    {Are you ordering a CV Procedure (e.g. stress test, cath, DCCV, TEE, etc)?   Press F2        :[id number]}   Signed, Nigel Wessman, NP

## 2022-07-28 ENCOUNTER — Encounter: Payer: Self-pay | Admitting: Cardiology

## 2022-08-05 ENCOUNTER — Ambulatory Visit: Payer: 59 | Attending: Cardiology | Admitting: Nurse Practitioner

## 2022-08-05 ENCOUNTER — Encounter: Payer: Self-pay | Admitting: Nurse Practitioner

## 2022-08-05 ENCOUNTER — Other Ambulatory Visit
Admission: RE | Admit: 2022-08-05 | Discharge: 2022-08-05 | Disposition: A | Discharge status: Home / Self Care | Payer: 59 | Attending: Nurse Practitioner | Admitting: Nurse Practitioner

## 2022-08-05 VITALS — BP 100/60 | HR 61 | Ht 71.0 in | Wt 167.2 lb

## 2022-08-05 DIAGNOSIS — Z79899 Other long term (current) drug therapy: Secondary | ICD-10-CM | POA: Diagnosis present

## 2022-08-05 DIAGNOSIS — E782 Mixed hyperlipidemia: Secondary | ICD-10-CM

## 2022-08-05 DIAGNOSIS — I10 Essential (primary) hypertension: Secondary | ICD-10-CM | POA: Diagnosis not present

## 2022-08-05 DIAGNOSIS — I5032 Chronic diastolic (congestive) heart failure: Secondary | ICD-10-CM

## 2022-08-05 DIAGNOSIS — F1010 Alcohol abuse, uncomplicated: Secondary | ICD-10-CM | POA: Diagnosis not present

## 2022-08-05 LAB — BASIC METABOLIC PANEL
Anion gap: 11 (ref 5–15)
BUN: 24 mg/dL — ABNORMAL HIGH (ref 8–23)
CO2: 22 mmol/L (ref 22–32)
Calcium: 8.2 mg/dL — ABNORMAL LOW (ref 8.9–10.3)
Chloride: 106 mmol/L (ref 98–111)
Creatinine, Ser: 1.44 mg/dL — ABNORMAL HIGH (ref 0.61–1.24)
GFR, Estimated: 52 mL/min — ABNORMAL LOW (ref >60)
Glucose, Bld: 120 mg/dL — ABNORMAL HIGH (ref 70–99)
Potassium: 4.7 mmol/L (ref 3.5–5.1)
Sodium: 139 mmol/L (ref 135–145)

## 2022-08-05 NOTE — Progress Notes (Signed)
Office Visit    Patient Name: John Spears Date of Encounter: 08/05/2022  Primary Care Provider:  [facility] Primary Cardiologist:  John Nehan, MD  Chief Complaint    72 year old male with a history of cardiomyopathy, chronic heart failure with improved ejection fraction, hypertension, seizure disorder, and alcohol abuse, presents for HF f/u.  Past Medical History    Past Medical History:  Diagnosis Date   Alcohol abuse    Heart failure with improved ejection fraction (HFimpEF) (HCC) 02/2022   a. 02/2019 Echo: EF 60-65%, GrI DD; b. 01/2022 Echo: EF 30-35%; c. 06/2022 Echo: EF 60-65%, mild LVH, RI DD, nl RV fxn, triv MR.   Hypertension    Seizure disorder John [facility])    Past Surgical History:  Procedure Laterality Date   JOINT REPLACEMENT      Allergies  No Known Allergies  History of Present Illness      72 y.o. y/o male with past medical history including nonischemic HFimpEF, chronic heart failure with ejection fraction, hypertension, seizure disorder, and alcohol abuse.  In December 2023, he was admitted to [facility] after being found unresponsive by a friend.  He was initially intubated requiring vasopressor support there was concern that he may have had seizures prior to arrival.  Stroke workup was unremarkable.  Echo was performed and showed EF of 35 to 40% with moderate asymmetric hypertrophy of the basal septum and mid/apical anterior/anterolateral hypokinesis.  PA pressures were moderately elevated, and no significant valvular disease was noted.  Troponin peaked at 291.  He was seen by our team and placed on GDMT including beta-blocker, Entresto, spironolactone, and daily Lasix.  He was not felt to be a good candidate for diagnostic catheterization due to ongoing encephalopathy.  He was treated for aspiration pneumonia and subsequently discharged and follow-up in clinic in early April.  Patient and family prefer to hold off on ischemic testing due  to finances.  Recent emergency department in early April with abdominal pain.  He was found to have a BUN of 60 with a creatinine of 2.31, and potassium of 5.6.  Entresto spironolactone placed on hold.  Abdominal imaging was unremarkable and he was subsequently discharged.  Follow-up lab work on Jun 24, 2022 showed improvement to BUN/creatinine of 40/1.66 with normal potassium at 4.8.  Follow-up echo the same day, showed improvement in EF to 60 to 65% with mild LVH, grade 1 diastolic dysfunction, trivial MR.  Patient was advised to remain off spironolactone and Entresto.   Since his last visit, John Spears reports that he has done well.  He does not routinely exercise.  He denies chest pain, dyspnea, palpitations, PND, orthopnea, dizziness, syncope, edema, or early satiety.  His significant other is with him today, and she notes that after receiving notification of the normal echo, she presumed that he was to go back Entresto, spironolactone, and potassium, and she has been leaving this and has medicines over the past week or more.  Patient has not noticed any ill effects.  He continues to drink up to 2, 40 ounce beers daily.  He is not currently contemplating quitting.  Home Medications    Current Outpatient Medications  Medication Sig Dispense Refill   aspirin 81 MG chewable tablet Chew 1 tablet (81 mg total) by mouth daily. 30 tablet 11   atorvastatin (LIPITOR) 40 MG tablet Take 40 mg by mouth daily.     carvedilol (COREG) 25 MG tablet Take 1 tablet (25 mg total) by  mouth 2 (two) times daily with a meal. 60 tablet 11   cyanocobalamin (VITAMIN B12) 1000 MCG tablet Take 1,000 mcg by mouth daily.     FEROSUL 325 (65 Fe) MG tablet Take 325 mg by mouth daily with breakfast.     folic acid (FOLVITE) 1 MG tablet Take 1 mg by mouth daily.     furosemide (LASIX) 40 MG tablet Take 1 tablet (40 mg total) by mouth daily. 30 tablet    iron polysaccharides (NIFEREX) 150 MG capsule Take 1 capsule (150 mg total) by  mouth daily. 30 capsule 2   magnesium oxide (MAG-OX) 400 (240 Mg) MG tablet Take 1 tablet by mouth daily.     thiamine (VITAMIN B1) 100 MG tablet Take 100 mg by mouth daily.     guaiFENesin-dextromethorphan (ROBITUSSIN DM) 100-10 MG/5ML syrup Take 5 mLs by mouth every 4 (four) hours as needed for cough. (Patient not taking: Reported on 04/03/2022) 118 mL 0   levETIRAcetam (KEPPRA) 1000 MG tablet Take 1 tablet (1,000 mg total) by mouth 2 (two) times daily. (Patient not taking: Reported on 08/05/2022) 60 tablet 11   No current facility-administered medications for this visit.     Review of Systems    He denies chest pain, palpitations, dyspnea, pnd, orthopnea, n, v, dizziness, syncope, edema, weight gain, or early satiety.  All other systems reviewed and are otherwise negative except as noted above.    Physical Exam    VS:  BP 100/60 (BP Location: Left Arm, Patient Position: Sitting, Cuff Size: Normal)   Pulse 61   Ht 5\' 11"  (1.803 m)   Wt 167 lb 4 oz (75.9 kg)   SpO2 98%   BMI 23.33 kg/m  , BMI Body mass index is 23.33 kg/m.     GEN: Well nourished, well developed, in no acute distress. HEENT: normal. Neck: Supple, no JVD, carotid bruits, or masses. Cardiac: RRR, no murmurs, rubs, or gallops. No clubbing, cyanosis, edema.  Radials 2+/PT 2+ and equal bilaterally.  Respiratory:  Respirations regular and unlabored, clear to auscultation bilaterally. GI: Soft, nontender, nondistended, BS + x 4. MS: no deformity or atrophy. Skin: warm and dry, no rash. Neuro:  Strength and sensation are intact. Psych: Flat affect.  Accessory Clinical Findings    ECG personally reviewed by me today -regular sinus rhythm, 61, first-degree AV block, left axis deviation flutter- no acute changes.  Lab Results  Component Value Date   WBC 7.1 06/01/2022   HGB 10.0 (L) 06/01/2022   HCT 31.3 (L) 06/01/2022   MCV 90.2 06/01/2022   PLT 167 06/01/2022   Lab Results  Component Value Date   CREATININE  1.44 (H) 08/05/2022   BUN 24 (H) 08/05/2022   NA 139 08/05/2022   K 4.7 08/05/2022   CL 106 08/05/2022   CO2 22 08/05/2022   Lab Results  Component Value Date   ALT 8 06/01/2022   AST 16 06/01/2022   ALKPHOS 60 06/01/2022   BILITOT 0.7 06/01/2022   Lab Results  Component Value Date   CHOL 254 (H) 03/08/2019   HDL 111 03/08/2019   LDLCALC 133 (H) 03/08/2019   TRIG 51 03/08/2019   CHOLHDL 2.3 03/08/2019    Lab Results  Component Value Date   HGBA1C 5.0 02/21/2022    Assessment & Plan    1.  Chronic heart failure with improved ejection fraction: Patient admitted in December 2023 after being found unresponsive with concern for seizures.  Troponin peaked at 291.  Echo showed an EF of 30 to 35%.  He was placed on GDMT with plan for outpatient ischemic evaluation.  He subsequently had to come off of Entresto, spironolactone, and potassium due to hyperkalemia and acute kidney injury in early April 2024.  Recent follow-up echo shows improvement in EF to 60-65%.  He has been feeling well without chest pain, dyspnea, or edema.  He continues to drink 2, 40 ounce beers daily and is not currently contemplating quitting.  Alcohol cessation strongly advised.  His wife recently started leaving his Entresto, spironolactone, potassium and his regular medicines and I encouraged her to discontinue given prior lab work and will arrange for follow-up basic metabolic panel today.  It is possible that he will not need as much Lasix as he is currently taking.  We again discussed pursuit of an ischemic evaluation however, patient wishes to defer at this time.  Continue beta-blocker.  2.  Alcohol abuse: As above, still drinking 2, 40 ounce beers daily.  Complete cessation advised.  Unfortunately, he is not considering quitting.  3.  Essential hypertension: Blood pressure soft today.  Stopping Entresto and spironolactone in the setting of acute kidney injury and hyperkalemia previously.  Follow-up labs today.   Continue beta-blocker.  4.  Hyperlipidemia: On statin therapy and followed by general surgery clinic.  5.  Seizure disorder: Quiescent.  Was on Keppra, no longer taking.  Seen by neurology.  6.  Disposition: Follow-up basic metabolic panel today.  Follow-up in 3 months or sooner if necessary.  Nicolasa Ducking, NP 08/05/2022, 12:41 PM

## 2022-08-05 NOTE — Patient Instructions (Signed)
Medication Instructions:  Your physician recommends the following medication changes.  STOP TAKING: Potassium 20 meq daily Entresto 49-51 mg  two times daily Spironolactone 25 mg daily    *If you need a refill on your cardiac medications before your next appointment, please call your pharmacy*   Lab Work: Your provider would like for you to have following labs drawn: BMET.   Please go to the [facility] entrance and check in at the front desk.  You do not need an appointment.  They are open from 7am-6 pm.   If you have labs (blood work) drawn today and your tests are completely normal, you will receive your results only by: MyChart Message (if you have MyChart) OR A paper copy in the mail If you have any lab test that is abnormal or we need to change your treatment, we will call you to review the results.   Testing/Procedures: No test ordered today.    Follow-Up: At [facility], you and your health needs are our priority.  As part of our continuing mission to provide you with exceptional heart care, we have created designated Provider Care Teams.  These Care Teams include your primary Cardiologist (physician) and Advanced Practice Providers (APPs -  Physician Assistants and Nurse Practitioners) who all work together to provide you with the care you need, when you need it.  We recommend signing up for the patient portal called "MyChart".  Sign up information is provided on this After Visit Summary.  MyChart is used to connect with patients for Virtual Visits (Telemedicine).  Patients are able to view lab/test results, encounter notes, upcoming appointments, etc.  Non-urgent messages can be sent to your provider as well.   To learn more about what you can do with MyChart, go to [email or web address].    Your next appointment:   3 month(s)  Provider:   Yvonne Elijha, MD

## 2022-08-13 ENCOUNTER — Other Ambulatory Visit: Payer: Self-pay | Admitting: *Deleted

## 2022-08-13 MED ORDER — FUROSEMIDE 20 MG PO TABS: 20.0000 mg | ORAL_TABLET | Freq: Every day | ORAL | 0 refills | Status: DC

## 2022-09-19 ENCOUNTER — Other Ambulatory Visit: Payer: Self-pay

## 2022-09-19 ENCOUNTER — Emergency Department
Admission: EM | Admit: 2022-09-19 | Discharge: 2022-09-19 | Disposition: A | Discharge status: Home / Self Care | Payer: 59 | Attending: Student in an Organized Health Care Education/Training Program | Admitting: Student in an Organized Health Care Education/Training Program

## 2022-09-19 ENCOUNTER — Emergency Department: Payer: 59

## 2022-09-19 DIAGNOSIS — I509 Heart failure, unspecified: Secondary | ICD-10-CM | POA: Insufficient documentation

## 2022-09-19 DIAGNOSIS — I110 Hypertensive heart disease with heart failure: Secondary | ICD-10-CM | POA: Diagnosis not present

## 2022-09-19 DIAGNOSIS — R0789 Other chest pain: Secondary | ICD-10-CM

## 2022-09-19 DIAGNOSIS — I10 Essential (primary) hypertension: Secondary | ICD-10-CM

## 2022-09-19 DIAGNOSIS — R079 Chest pain, unspecified: Secondary | ICD-10-CM | POA: Diagnosis present

## 2022-09-19 LAB — CBC WITH DIFFERENTIAL/PLATELET
Abs Immature Granulocytes: 0.02 10*3/uL (ref 0.00–0.07)
Basophils Absolute: 0.1 10*3/uL (ref 0.0–0.1)
Basophils Relative: 1 %
Eosinophils Absolute: 0.2 10*3/uL (ref 0.0–0.5)
Eosinophils Relative: 3 %
HCT: 26.8 % — ABNORMAL LOW (ref 39.0–52.0)
Hemoglobin: 8.7 g/dL — ABNORMAL LOW (ref 13.0–17.0)
Immature Granulocytes: 0 %
Lymphocytes Relative: 25 %
Lymphs Abs: 1.6 10*3/uL (ref 0.7–4.0)
MCH: 30.2 pg (ref 26.0–34.0)
MCHC: 32.5 g/dL (ref 30.0–36.0)
MCV: 93.1 fL (ref 80.0–100.0)
Monocytes Absolute: 0.9 10*3/uL (ref 0.1–1.0)
Monocytes Relative: 15 %
Neutro Abs: 3.6 10*3/uL (ref 1.7–7.7)
Neutrophils Relative %: 56 %
Platelets: 112 10*3/uL — ABNORMAL LOW (ref 150–400)
RBC: 2.88 MIL/uL — ABNORMAL LOW (ref 4.22–5.81)
RDW: 14.6 % (ref 11.5–15.5)
WBC: 6.4 10*3/uL (ref 4.0–10.5)
nRBC: 0 % (ref 0.0–0.2)

## 2022-09-19 LAB — COMPREHENSIVE METABOLIC PANEL
ALT: 8 U/L (ref 0–44)
AST: 15 U/L (ref 15–41)
Albumin: 2.8 g/dL — ABNORMAL LOW (ref 3.5–5.0)
Alkaline Phosphatase: 45 U/L (ref 38–126)
Anion gap: 9 (ref 5–15)
BUN: 7 mg/dL — ABNORMAL LOW (ref 8–23)
CO2: 20 mmol/L — ABNORMAL LOW (ref 22–32)
Calcium: 8.2 mg/dL — ABNORMAL LOW (ref 8.9–10.3)
Chloride: 111 mmol/L (ref 98–111)
Creatinine, Ser: 0.82 mg/dL (ref 0.61–1.24)
GFR, Estimated: >60 mL/min (ref >60)
Glucose, Bld: 79 mg/dL (ref 70–99)
Potassium: 2.9 mmol/L — ABNORMAL LOW (ref 3.5–5.1)
Sodium: 140 mmol/L (ref 135–145)
Total Bilirubin: 0.8 mg/dL (ref 0.3–1.2)
Total Protein: 5.5 g/dL — ABNORMAL LOW (ref 6.5–8.1)

## 2022-09-19 LAB — LIPASE, BLOOD: Lipase: 28 U/L (ref 11–51)

## 2022-09-19 LAB — TROPONIN I (HIGH SENSITIVITY)
Troponin I (High Sensitivity): 12 ng/L (ref <18)
Troponin I (High Sensitivity): 12 ng/L (ref <18)

## 2022-09-19 MED ORDER — FUROSEMIDE 10 MG/ML IJ SOLN
40.0000 mg | Freq: Once | INTRAMUSCULAR | Status: AC
  Administered 2022-09-19: 40 mg via INTRAVENOUS
  Filled 2022-09-19: qty 4

## 2022-09-19 MED ORDER — ALUM & MAG HYDROXIDE-SIMETH 200-200-20 MG/5ML PO SUSP
30.0000 mL | Freq: Once | ORAL | Status: AC
  Administered 2022-09-19: 30 mL via ORAL
  Filled 2022-09-19: qty 30

## 2022-09-19 MED ORDER — AMLODIPINE BESYLATE 5 MG PO TABS
5.0000 mg | ORAL_TABLET | Freq: Once | ORAL | Status: AC
  Administered 2022-09-19: 5 mg via ORAL
  Filled 2022-09-19: qty 1

## 2022-09-19 MED ORDER — PANTOPRAZOLE SODIUM 20 MG PO TBEC: 20.0000 mg | DELAYED_RELEASE_TABLET | Freq: Every day | ORAL | 1 refills | Status: DC

## 2022-09-19 MED ORDER — POTASSIUM CHLORIDE CRYS ER 20 MEQ PO TBCR
40.0000 meq | EXTENDED_RELEASE_TABLET | Freq: Once | ORAL | Status: AC
  Administered 2022-09-19: 40 meq via ORAL
  Filled 2022-09-19: qty 2

## 2022-09-19 MED ORDER — ACETAMINOPHEN 325 MG PO TABS
650.0000 mg | ORAL_TABLET | Freq: Once | ORAL | Status: AC
  Administered 2022-09-19: 650 mg via ORAL
  Filled 2022-09-19: qty 2

## 2022-09-19 NOTE — ED Notes (Signed)
Pts sister called, report given. Pt being dc'd to sister.

## 2022-09-19 NOTE — ED Provider Notes (Signed)
[facility] Provider Note    Event Date/Time   First MD Initiated Contact with Patient 09/19/22 0825     (approximate)   History   Chest Pain   HPI  John Spears is a 72 y.o. male with a history of alcohol abuse, heart failure, seizure disorder, hypertension presents to the ER for evaluate chest pain since yesterday.  Does admit to drinking "a smart amount.  "Denies any chest pain right now.  No diaphoresis.  No pleuritic chest pain.  Does have chronic lower extremity swelling.  Not worse than previous.  Denies any recent falls or injury.     Physical Exam   Triage Vital Signs: ED Triage Vitals  Encounter Vitals Group     BP      Systolic BP Percentile      Diastolic BP Percentile      Pulse      Resp      Temp      Temp src      SpO2      Weight      Height      Head Circumference      Peak Flow      Pain Score      Pain Loc      Pain Education      Exclude from Growth Chart     Most recent vital signs: Vitals:   09/19/22 1115 09/19/22 1130  BP: (!) 193/79 (!) 194/78  Pulse: (!) 43 (!) 43  Resp: 18 (!) 24  Temp:    SpO2: 100% 100%     Constitutional: Alert  Eyes: Conjunctivae are normal.  Head: Atraumatic. Nose: No congestion/rhinnorhea. Mouth/Throat: Mucous membranes are moist.   Neck: Painless ROM.  Cardiovascular:   Good peripheral circulation. Respiratory: Normal respiratory effort.  No retractions.  Gastrointestinal: Soft and nontender.  Musculoskeletal:  no deformity 2+ BLE edema Neurologic:  MAE spontaneously. No gross focal neurologic deficits are appreciated.  Skin:  Skin is warm, dry and intact. No rash noted. Psychiatric: Mood and affect are normal. Speech and behavior are normal.    ED Results / Procedures / Treatments   Labs (all labs ordered are listed, but only abnormal results are displayed) Labs Reviewed  CBC WITH DIFFERENTIAL/PLATELET - Abnormal; Notable for the following components:      Result  Value   RBC 2.88 (*)    Hemoglobin 8.7 (*)    HCT 26.8 (*)    Platelets 112 (*)    All other components within normal limits  COMPREHENSIVE METABOLIC PANEL - Abnormal; Notable for the following components:   Potassium 2.9 (*)    CO2 20 (*)    BUN 7 (*)    Calcium 8.2 (*)    Total Protein 5.5 (*)    Albumin 2.8 (*)    All other components within normal limits  LIPASE, BLOOD  TROPONIN I (HIGH SENSITIVITY)  TROPONIN I (HIGH SENSITIVITY)     EKG  ED ECG REPORT I, Willy Eddy, the attending physician, personally viewed and interpreted this ECG.   Date: 09/19/2022  EKG Time: 8:29  Rate: 65  Rhythm: sinus  Axis: normal  Intervals: normal  ST&T Change: no stemi, no depressions    RADIOLOGY Please see ED Course for my review and interpretation.  I personally reviewed all radiographic images ordered to evaluate for the above acute complaints and reviewed radiology reports and findings.  These findings were personally discussed with the patient.  Please  see medical record for radiology report.    PROCEDURES:  Critical Care performed: No  Procedures   MEDICATIONS ORDERED IN ED: Medications  furosemide (LASIX) injection 40 mg (has no administration in time range)  acetaminophen (TYLENOL) tablet 650 mg (650 mg Oral Given 09/19/22 0852)  alum & mag hydroxide-simeth (MAALOX/MYLANTA) 200-200-20 MG/5ML suspension 30 mL (30 mLs Oral Given 09/19/22 0945)  potassium chloride SA (KLOR-CON M) CR tablet 40 mEq (40 mEq Oral Given 09/19/22 0945)  amLODipine (NORVASC) tablet 5 mg (5 mg Oral Given 09/19/22 1006)     IMPRESSION / MDM / ASSESSMENT AND PLAN / ED COURSE  I reviewed the triage vital signs and the nursing notes.                              Differential diagnosis includes, but is not limited to, ACS, pericarditis, esophagitis, boerhaaves, pe, pna, chf, pancreatitis, gastritis  Patient presenting to the ER for evaluation of symptoms as described above.  Based on  symptoms, risk factors and considered above differential, this presenting complaint could reflect a potentially life-threatening illness therefore the patient will be placed on continuous pulse oximetry and telemetry for monitoring.  Laboratory evaluation will be sent to evaluate for the above complaints.  Patient nontoxic-appearing will give GI cocktail given description of symptoms.  Will observe for cardiac enzymes on monitor.  Does not seem consistent with PE or dissection.   Clinical Course as of 09/19/22 1200  Sat Sep 19, 2022  1159 Patient reassessed.  Remains pain-free.  He is a bit hypertensive mild bradycardia but sinus rhythm he is on beta-blockers.  Will give dose of Lasix but his troponins are negative.  His abdominal exam remains soft and benign he felt significant proved after GI cocktail therefore suspect some GERD.  Does appear stable and appropriate for outpatient follow-up.  Patient is agreeable with plan. [PR]    Clinical Course User Index [PR] Willy Eddy, MD     FINAL CLINICAL IMPRESSION(S) / ED DIAGNOSES   Final diagnoses:  Atypical chest pain  Uncontrolled hypertension     Rx / DC Orders   ED Discharge Orders          Ordered    pantoprazole (PROTONIX) 20 MG tablet  Daily        09/19/22 1158             Note:  This document was prepared using Dragon voice recognition software and may include unintentional dictation errors.    Willy Eddy, MD 09/19/22 1201

## 2022-09-19 NOTE — ED Triage Notes (Signed)
Pt brought in via EMS from home due to chest pain since yesterday. Pt states chest pain radiates to both shoulders. Pt states pain relived by medications.  Pt given PTA:   324mg  aspirin 3 nitroglycerin

## 2022-09-25 ENCOUNTER — Observation Stay
Admission: EM | Admit: 2022-09-25 | Discharge: 2022-09-26 | Disposition: A | Discharge status: Home / Self Care | Payer: 59 | Attending: Internal Medicine | Admitting: Internal Medicine

## 2022-09-25 ENCOUNTER — Other Ambulatory Visit: Payer: Self-pay

## 2022-09-25 ENCOUNTER — Observation Stay: Payer: 59

## 2022-09-25 ENCOUNTER — Emergency Department: Payer: 59

## 2022-09-25 DIAGNOSIS — Z87891 Personal history of nicotine dependence: Secondary | ICD-10-CM | POA: Diagnosis not present

## 2022-09-25 DIAGNOSIS — G40909 Epilepsy, unspecified, not intractable, without status epilepticus: Secondary | ICD-10-CM | POA: Diagnosis not present

## 2022-09-25 DIAGNOSIS — I5042 Chronic combined systolic (congestive) and diastolic (congestive) heart failure: Secondary | ICD-10-CM | POA: Diagnosis not present

## 2022-09-25 DIAGNOSIS — N179 Acute kidney failure, unspecified: Secondary | ICD-10-CM

## 2022-09-25 DIAGNOSIS — I5020 Unspecified systolic (congestive) heart failure: Secondary | ICD-10-CM | POA: Diagnosis present

## 2022-09-25 DIAGNOSIS — R7401 Elevation of levels of liver transaminase levels: Secondary | ICD-10-CM | POA: Diagnosis present

## 2022-09-25 DIAGNOSIS — D696 Thrombocytopenia, unspecified: Secondary | ICD-10-CM | POA: Diagnosis present

## 2022-09-25 DIAGNOSIS — G9340 Encephalopathy, unspecified: Secondary | ICD-10-CM | POA: Diagnosis present

## 2022-09-25 DIAGNOSIS — I130 Hypertensive heart and chronic kidney disease with heart failure and stage 1 through stage 4 chronic kidney disease, or unspecified chronic kidney disease: Secondary | ICD-10-CM | POA: Diagnosis not present

## 2022-09-25 DIAGNOSIS — I5032 Chronic diastolic (congestive) heart failure: Secondary | ICD-10-CM | POA: Diagnosis not present

## 2022-09-25 DIAGNOSIS — Z1152 Encounter for screening for COVID-19: Secondary | ICD-10-CM | POA: Diagnosis not present

## 2022-09-25 DIAGNOSIS — R7989 Other specified abnormal findings of blood chemistry: Secondary | ICD-10-CM | POA: Diagnosis not present

## 2022-09-25 DIAGNOSIS — R569 Unspecified convulsions: Secondary | ICD-10-CM

## 2022-09-25 DIAGNOSIS — Z7982 Long term (current) use of aspirin: Secondary | ICD-10-CM | POA: Diagnosis not present

## 2022-09-25 DIAGNOSIS — R079 Chest pain, unspecified: Secondary | ICD-10-CM

## 2022-09-25 DIAGNOSIS — Z9660 Presence of unspecified orthopedic joint implant: Secondary | ICD-10-CM | POA: Insufficient documentation

## 2022-09-25 DIAGNOSIS — I420 Dilated cardiomyopathy: Secondary | ICD-10-CM | POA: Diagnosis not present

## 2022-09-25 DIAGNOSIS — E785 Hyperlipidemia, unspecified: Secondary | ICD-10-CM

## 2022-09-25 DIAGNOSIS — F1010 Alcohol abuse, uncomplicated: Secondary | ICD-10-CM | POA: Diagnosis not present

## 2022-09-25 DIAGNOSIS — Z79899 Other long term (current) drug therapy: Secondary | ICD-10-CM | POA: Insufficient documentation

## 2022-09-25 DIAGNOSIS — N183 Chronic kidney disease, stage 3 unspecified: Secondary | ICD-10-CM | POA: Insufficient documentation

## 2022-09-25 DIAGNOSIS — I10 Essential (primary) hypertension: Secondary | ICD-10-CM

## 2022-09-25 DIAGNOSIS — M79629 Pain in unspecified upper arm: Secondary | ICD-10-CM | POA: Diagnosis present

## 2022-09-25 LAB — HEPATITIS PANEL, ACUTE
HCV Ab: NONREACTIVE
Hep A IgM: NONREACTIVE
Hep B C IgM: NONREACTIVE
Hepatitis B Surface Ag: NONREACTIVE

## 2022-09-25 LAB — COMPREHENSIVE METABOLIC PANEL
ALT: 162 U/L — ABNORMAL HIGH (ref 0–44)
AST: 246 U/L — ABNORMAL HIGH (ref 15–41)
Albumin: 2.9 g/dL — ABNORMAL LOW (ref 3.5–5.0)
Alkaline Phosphatase: 48 U/L (ref 38–126)
Anion gap: 12 (ref 5–15)
BUN: 20 mg/dL (ref 8–23)
CO2: 18 mmol/L — ABNORMAL LOW (ref 22–32)
Calcium: 8.6 mg/dL — ABNORMAL LOW (ref 8.9–10.3)
Chloride: 107 mmol/L (ref 98–111)
Creatinine, Ser: 1.52 mg/dL — ABNORMAL HIGH (ref 0.61–1.24)
GFR, Estimated: 49 mL/min — ABNORMAL LOW (ref >60)
Glucose, Bld: 82 mg/dL (ref 70–99)
Potassium: 3.9 mmol/L (ref 3.5–5.1)
Sodium: 137 mmol/L (ref 135–145)
Total Bilirubin: 1.1 mg/dL (ref 0.3–1.2)
Total Protein: 5.7 g/dL — ABNORMAL LOW (ref 6.5–8.1)

## 2022-09-25 LAB — TROPONIN I (HIGH SENSITIVITY)
Troponin I (High Sensitivity): 46 ng/L — ABNORMAL HIGH (ref <18)
Troponin I (High Sensitivity): 46 ng/L — ABNORMAL HIGH (ref <18)
Troponin I (High Sensitivity): 46 ng/L — ABNORMAL HIGH (ref <18)
Troponin I (High Sensitivity): 51 ng/L — ABNORMAL HIGH (ref <18)

## 2022-09-25 LAB — NM MYOCAR MULTI W/SPECT W/WALL MOTION / EF
LV dias vol: 57 mL (ref 62–150)
LV sys vol: 24 mL
MPHR: 150 {beats}/min
Nuc Stress EF: 58 %
Peak HR: 66 {beats}/min
Percent HR: 44 %
Rest HR: 65 {beats}/min
Rest Nuclear Isotope Dose: 10.4 mCi
SDS: 0
SRS: 0
SSS: 0
ST Depression (mm): 0 mm
Stress Nuclear Isotope Dose: 32.5 mCi
TID: 1.42

## 2022-09-25 LAB — BASIC METABOLIC PANEL
Anion gap: 10 (ref 5–15)
BUN: 20 mg/dL (ref 8–23)
CO2: 21 mmol/L — ABNORMAL LOW (ref 22–32)
Calcium: 8.4 mg/dL — ABNORMAL LOW (ref 8.9–10.3)
Chloride: 108 mmol/L (ref 98–111)
Creatinine, Ser: 1.36 mg/dL — ABNORMAL HIGH (ref 0.61–1.24)
GFR, Estimated: 56 mL/min — ABNORMAL LOW (ref >60)
Glucose, Bld: 101 mg/dL — ABNORMAL HIGH (ref 70–99)
Potassium: 3.3 mmol/L — ABNORMAL LOW (ref 3.5–5.1)
Sodium: 139 mmol/L (ref 135–145)

## 2022-09-25 LAB — CBC WITH DIFFERENTIAL/PLATELET
Abs Immature Granulocytes: 0.02 10*3/uL (ref 0.00–0.07)
Basophils Absolute: 0.1 10*3/uL (ref 0.0–0.1)
Basophils Relative: 1 %
Eosinophils Absolute: 0.2 10*3/uL (ref 0.0–0.5)
Eosinophils Relative: 3 %
HCT: 29 % — ABNORMAL LOW (ref 39.0–52.0)
Hemoglobin: 9.7 g/dL — ABNORMAL LOW (ref 13.0–17.0)
Immature Granulocytes: 0 %
Lymphocytes Relative: 27 %
Lymphs Abs: 1.9 10*3/uL (ref 0.7–4.0)
MCH: 30.4 pg (ref 26.0–34.0)
MCHC: 33.4 g/dL (ref 30.0–36.0)
MCV: 90.9 fL (ref 80.0–100.0)
Monocytes Absolute: 0.8 10*3/uL (ref 0.1–1.0)
Monocytes Relative: 12 %
Neutro Abs: 4 10*3/uL (ref 1.7–7.7)
Neutrophils Relative %: 57 %
Platelets: 129 10*3/uL — ABNORMAL LOW (ref 150–400)
RBC: 3.19 MIL/uL — ABNORMAL LOW (ref 4.22–5.81)
RDW: 14.6 % (ref 11.5–15.5)
WBC: 6.9 10*3/uL (ref 4.0–10.5)
nRBC: 0 % (ref 0.0–0.2)

## 2022-09-25 LAB — D-DIMER, QUANTITATIVE: D-Dimer, Quant: 1.23 ug/mL-FEU — ABNORMAL HIGH (ref 0.00–0.50)

## 2022-09-25 LAB — URINALYSIS, ROUTINE W REFLEX MICROSCOPIC
Bilirubin Urine: NEGATIVE
Glucose, UA: NEGATIVE mg/dL
Hgb urine dipstick: NEGATIVE
Ketones, ur: 20 mg/dL — AB
Leukocytes,Ua: NEGATIVE
Nitrite: NEGATIVE
Protein, ur: NEGATIVE mg/dL
Specific Gravity, Urine: 1.017 (ref 1.005–1.030)
pH: 5 (ref 5.0–8.0)

## 2022-09-25 LAB — PROTIME-INR
INR: 1.2 (ref 0.8–1.2)
Prothrombin Time: 15.2 seconds (ref 11.4–15.2)

## 2022-09-25 LAB — ACETAMINOPHEN LEVEL: Acetaminophen (Tylenol), Serum: <10 ug/mL — ABNORMAL LOW (ref 10–30)

## 2022-09-25 LAB — BRAIN NATRIURETIC PEPTIDE: B Natriuretic Peptide: 233.1 pg/mL — ABNORMAL HIGH (ref 0.0–100.0)

## 2022-09-25 LAB — SARS CORONAVIRUS 2 BY RT PCR: SARS Coronavirus 2 by RT PCR: NEGATIVE

## 2022-09-25 LAB — MAGNESIUM: Magnesium: 1.5 mg/dL — ABNORMAL LOW (ref 1.7–2.4)

## 2022-09-25 MED ORDER — ACETYLCYSTEINE 20 % IN SOLN
600.0000 mg | Freq: Two times a day (BID) | RESPIRATORY_TRACT | Status: DC
  Administered 2022-09-25: 600 mg via ORAL
  Filled 2022-09-25 (×2): qty 4

## 2022-09-25 MED ORDER — LORAZEPAM 0.5 MG PO TABS
0.5000 mg | ORAL_TABLET | ORAL | Status: DC | PRN
  Administered 2022-09-25: 0.5 mg via ORAL
  Filled 2022-09-25: qty 1

## 2022-09-25 MED ORDER — SODIUM CHLORIDE 0.9 % IV BOLUS
500.0000 mL | Freq: Once | INTRAVENOUS | Status: AC
  Administered 2022-09-25: 500 mL via INTRAVENOUS

## 2022-09-25 MED ORDER — ATORVASTATIN CALCIUM 20 MG PO TABS
40.0000 mg | ORAL_TABLET | Freq: Every day | ORAL | Status: DC
  Administered 2022-09-25: 40 mg via ORAL
  Filled 2022-09-25: qty 2

## 2022-09-25 MED ORDER — ASPIRIN 81 MG PO CHEW
81.0000 mg | CHEWABLE_TABLET | Freq: Every day | ORAL | Status: DC
  Administered 2022-09-26: 81 mg via ORAL
  Filled 2022-09-25: qty 1

## 2022-09-25 MED ORDER — LORAZEPAM 1 MG PO TABS: 1.0000 mg | ORAL_TABLET | ORAL | Status: DC | PRN

## 2022-09-25 MED ORDER — ALBUTEROL SULFATE (2.5 MG/3ML) 0.083% IN NEBU
2.5000 mg | INHALATION_SOLUTION | RESPIRATORY_TRACT | Status: DC | PRN
  Administered 2022-09-25: 2.5 mg via RESPIRATORY_TRACT
  Filled 2022-09-25 (×2): qty 3

## 2022-09-25 MED ORDER — FUROSEMIDE 20 MG PO TABS
20.0000 mg | ORAL_TABLET | Freq: Every day | ORAL | Status: DC
  Administered 2022-09-25: 20 mg via ORAL
  Filled 2022-09-25: qty 1

## 2022-09-25 MED ORDER — THIAMINE MONONITRATE 100 MG PO TABS
100.0000 mg | ORAL_TABLET | Freq: Every day | ORAL | Status: DC
  Administered 2022-09-25 – 2022-09-26 (×2): 100 mg via ORAL
  Filled 2022-09-25 (×2): qty 1

## 2022-09-25 MED ORDER — MAGNESIUM SULFATE 2 GM/50ML IV SOLN
2.0000 g | Freq: Once | INTRAVENOUS | Status: AC
  Administered 2022-09-25: 2 g via INTRAVENOUS
  Filled 2022-09-25: qty 50

## 2022-09-25 MED ORDER — THIAMINE HCL 100 MG/ML IJ SOLN: 100.0000 mg | Freq: Every day | INTRAMUSCULAR | Status: DC

## 2022-09-25 MED ORDER — ACETAMINOPHEN 325 MG PO TABS: 650.0000 mg | ORAL_TABLET | ORAL | Status: DC | PRN

## 2022-09-25 MED ORDER — PANTOPRAZOLE SODIUM 20 MG PO TBEC
20.0000 mg | DELAYED_RELEASE_TABLET | Freq: Every day | ORAL | Status: DC
  Administered 2022-09-25 – 2022-09-26 (×2): 20 mg via ORAL
  Filled 2022-09-25 (×2): qty 1

## 2022-09-25 MED ORDER — LACTULOSE 10 GM/15ML PO SOLN
30.0000 g | Freq: Three times a day (TID) | ORAL | Status: DC
  Administered 2022-09-25 – 2022-09-26 (×2): 30 g via ORAL
  Filled 2022-09-25 (×2): qty 60

## 2022-09-25 MED ORDER — ENOXAPARIN SODIUM 40 MG/0.4ML IJ SOSY
40.0000 mg | PREFILLED_SYRINGE | INTRAMUSCULAR | Status: DC
  Administered 2022-09-25 – 2022-09-26 (×2): 40 mg via SUBCUTANEOUS
  Filled 2022-09-25 (×2): qty 0.4

## 2022-09-25 MED ORDER — FOLIC ACID 1 MG PO TABS
1.0000 mg | ORAL_TABLET | Freq: Every day | ORAL | Status: DC
  Administered 2022-09-25 – 2022-09-26 (×2): 1 mg via ORAL
  Filled 2022-09-25 (×2): qty 1

## 2022-09-25 MED ORDER — REGADENOSON 0.4 MG/5ML IV SOLN
0.4000 mg | Freq: Once | INTRAVENOUS | Status: AC
  Administered 2022-09-25: 0.4 mg via INTRAVENOUS

## 2022-09-25 MED ORDER — IOHEXOL 350 MG/ML SOLN
75.0000 mL | Freq: Once | INTRAVENOUS | Status: AC | PRN
  Administered 2022-09-25: 75 mL via INTRAVENOUS

## 2022-09-25 MED ORDER — ONDANSETRON HCL 4 MG/2ML IJ SOLN: 4.0000 mg | Freq: Four times a day (QID) | INTRAMUSCULAR | Status: DC | PRN

## 2022-09-25 MED ORDER — ASPIRIN 81 MG PO CHEW
324.0000 mg | CHEWABLE_TABLET | Freq: Once | ORAL | Status: AC
  Administered 2022-09-25: 324 mg via ORAL
  Filled 2022-09-25: qty 4

## 2022-09-25 MED ORDER — OXYCODONE HCL 5 MG PO TABS: 5.0000 mg | ORAL_TABLET | ORAL | Status: DC | PRN

## 2022-09-25 MED ORDER — TECHNETIUM TC 99M TETROFOSMIN IV KIT
32.4800 | PACK | Freq: Once | INTRAVENOUS | Status: AC | PRN
  Administered 2022-09-25: 32.48 via INTRAVENOUS

## 2022-09-25 MED ORDER — TECHNETIUM TC 99M TETROFOSMIN IV KIT
10.0000 | PACK | Freq: Once | INTRAVENOUS | Status: AC | PRN
  Administered 2022-09-25: 10.35 via INTRAVENOUS

## 2022-09-25 MED ORDER — MORPHINE SULFATE (PF) 4 MG/ML IV SOLN
4.0000 mg | INTRAVENOUS | Status: DC | PRN
  Administered 2022-09-25 – 2022-09-26 (×4): 4 mg via INTRAVENOUS
  Filled 2022-09-25 (×4): qty 1

## 2022-09-25 MED ORDER — NITROGLYCERIN 0.4 MG SL SUBL
0.4000 mg | SUBLINGUAL_TABLET | SUBLINGUAL | Status: DC | PRN
  Administered 2022-09-25: 0.4 mg via SUBLINGUAL
  Filled 2022-09-25: qty 1

## 2022-09-25 MED ORDER — ADULT MULTIVITAMIN W/MINERALS CH
1.0000 | ORAL_TABLET | Freq: Every day | ORAL | Status: DC
  Administered 2022-09-25 – 2022-09-26 (×2): 1 via ORAL
  Filled 2022-09-25 (×2): qty 1

## 2022-09-25 MED ORDER — ENSURE ENLIVE PO LIQD
237.0000 mL | Freq: Two times a day (BID) | ORAL | Status: DC
  Administered 2022-09-26: 237 mL via ORAL

## 2022-09-25 NOTE — Assessment & Plan Note (Addendum)
Patient no longer on Keppra No reported seizure activity Monitor in setting of history of alcohol abuse

## 2022-09-25 NOTE — ED Notes (Signed)
ED TO INPATIENT HANDOFF REPORT  ED Nurse Name and Phone #:   Florentina Addison [phone]  S Name/Age/Gender John Spears 72 y.o. male Room/Bed: ED04A/ED04A  Code Status   Code Status: Full Code  Home/SNF/Other Home Patient oriented to: self, place, time, and situation Is this baseline? Yes   Triage Complete: Triage complete  Chief Complaint Chest pain [R07.9]  Triage Note Pt presents to the ED via EMS with multiple complaints. Pt states he has not had anything to eat in a "couple of days." Pt states he had one beer yesterday. Pt c/o pain in his upper extremities, back and shoulders. Pt states he occasionally has SOB.   Allergies No Known Allergies  Level of Care/Admitting Diagnosis ED Disposition     ED Disposition  Admit   Condition  --   Comment  Hospital [facility]: [facility] [[id number]]  Level of Care: Telemetry Cardiac [[id number]]  Covid Evaluation: Confirmed COVID Negative  Diagnosis: Chest pain [440347]  Admitting Physician: Floydene Flock [[id number]]  Attending Physician: Floydene Flock [[id number]]          B Medical/Surgery History Past Medical History:  Diagnosis Date   Alcohol abuse    Heart failure with improved ejection fraction (HFimpEF) (HCC) 02/2022   a. 02/2019 Echo: EF 60-65%, GrI DD; b. 01/2022 Echo: EF 30-35%; c. 06/2022 Echo: EF 60-65%, mild LVH, RI DD, nl RV fxn, triv MR.   Hypertension    Seizure disorder [facility])    Past Surgical History:  Procedure Laterality Date   JOINT REPLACEMENT       A IV Location/Drains/Wounds Patient Lines/Drains/Airways Status     Active Line/Drains/Airways     Name Placement date Placement time Site Days   Peripheral IV 09/25/22 20 G Anterior;Distal;Right;Upper Arm 09/25/22  0639  Arm  less than 1   Wound / Incision (Open or Dehisced) 03/29/16 Other (Comment) Toe (Comment  which one) Right open wound on right 2nd toe from Great toe 03/29/16  2158  Toe (Comment  which one)  2371             Intake/Output Last 24 hours  Intake/Output Summary (Last 24 hours) at 09/25/2022 1233 Last data filed at 09/25/2022 0856 Gross per 24 hour  Intake 500 ml  Output --  Net 500 ml    Labs/Imaging Results for orders placed or performed during the hospital encounter of 09/25/22 (from the past 48 hour(s))  CBC with Differential     Status: Abnormal   Collection Time: 09/25/22  6:33 AM  Result Value Ref Range   WBC 6.9 4.0 - 10.5 K/uL   RBC 3.19 (L) 4.22 - 5.81 MIL/uL   Hemoglobin 9.7 (L) 13.0 - 17.0 g/dL   HCT 56.3 (L) 87.5 - 64.3 %   MCV 90.9 80.0 - 100.0 fL   MCH 30.4 26.0 - 34.0 pg   MCHC 33.4 30.0 - 36.0 g/dL   RDW 32.9 51.8 - 84.1 %   Platelets 129 (L) 150 - 400 K/uL   nRBC 0.0 0.0 - 0.2 %   Neutrophils Relative % 57 %   Neutro Abs 4.0 1.7 - 7.7 K/uL   Lymphocytes Relative 27 %   Lymphs Abs 1.9 0.7 - 4.0 K/uL   Monocytes Relative 12 %   Monocytes Absolute 0.8 0.1 - 1.0 K/uL   Eosinophils Relative 3 %   Eosinophils Absolute 0.2 0.0 - 0.5 K/uL   Basophils Relative 1 %   Basophils Absolute 0.1 0.0 - 0.1 K/uL  Immature Granulocytes 0 %   Abs Immature Granulocytes 0.02 0.00 - 0.07 K/uL    Comment: Performed at [facility], [address] [place], [place] [address]  Comprehensive metabolic panel     Status: Abnormal   Collection Time: 09/25/22  6:33 AM  Result Value Ref Range   Sodium 137 135 - 145 mmol/L   Potassium 3.9 3.5 - 5.1 mmol/L   Chloride 107 98 - 111 mmol/L   CO2 18 (L) 22 - 32 mmol/L   Glucose, Bld 82 70 - 99 mg/dL    Comment: Glucose reference range applies only to samples taken after fasting for at least 8 hours.   BUN 20 8 - 23 mg/dL   Creatinine, Ser 6.04 (H) 0.61 - 1.24 mg/dL   Calcium 8.6 (L) 8.9 - 10.3 mg/dL   Total Protein 5.7 (L) 6.5 - 8.1 g/dL   Albumin 2.9 (L) 3.5 - 5.0 g/dL   AST 540 (H) 15 - 41 U/L   ALT 162 (H) 0 - 44 U/L   Alkaline Phosphatase 48 38 - 126 U/L   Total Bilirubin 1.1 0.3 - 1.2 mg/dL   GFR, Estimated 49 (L) >60  mL/min    Comment: (NOTE) Calculated using the CKD-EPI Creatinine Equation (2021)    Anion gap 12 5 - 15    Comment: Performed at [facility], [address] [place], [place] [address]  Magnesium     Status: Abnormal   Collection Time: 09/25/22  6:33 AM  Result Value Ref Range   Magnesium 1.5 (L) 1.7 - 2.4 mg/dL    Comment: Performed at [facility], [address] [place], [place] [address]  Troponin I (High Sensitivity)     Status: Abnormal   Collection Time: 09/25/22  6:33 AM  Result Value Ref Range   Troponin I (High Sensitivity) 46 (H) <18 ng/L    Comment: (NOTE) Elevated high sensitivity troponin I (hsTnI) values and significant  changes across serial measurements may suggest ACS but many other  chronic and acute conditions are known to elevate hsTnI results.  Refer to the "Links" section for chest pain algorithms and additional  guidance. Performed at [facility], [address] [place], [place] [address]   Brain natriuretic peptide     Status: Abnormal   Collection Time: 09/25/22  6:33 AM  Result Value Ref Range   B Natriuretic Peptide 233.1 (H) 0.0 - 100.0 pg/mL    Comment: Performed at [facility], [address] [place], [place] [address]  Urinalysis, Routine w reflex microscopic -Urine, Clean Catch     Status: Abnormal   Collection Time: 09/25/22  7:01 AM  Result Value Ref Range   Color, Urine YELLOW (A) YELLOW   APPearance CLEAR (A) CLEAR   Specific Gravity, Urine 1.017 1.005 - 1.030   pH 5.0 5.0 - 8.0   Glucose, UA NEGATIVE NEGATIVE mg/dL   Hgb urine dipstick NEGATIVE NEGATIVE   Bilirubin Urine NEGATIVE NEGATIVE   Ketones, ur 20 (A) NEGATIVE mg/dL   Protein, ur NEGATIVE NEGATIVE mg/dL   Nitrite NEGATIVE NEGATIVE   Leukocytes,Ua NEGATIVE NEGATIVE    Comment: Performed at [facility], [address], [place], [place] [address]  SARS Coronavirus 2 by RT PCR (hospital order, performed in [facility]  hospital lab) *cepheid single result test* Anterior Nasal Swab     Status: None   Collection Time: 09/25/22  7:01 AM   Specimen: Anterior Nasal Swab  Result Value Ref  Range   SARS Coronavirus 2 by RT PCR NEGATIVE NEGATIVE    Comment: (NOTE) SARS-CoV-2 target nucleic acids are NOT DETECTED.  The SARS-CoV-2 RNA is generally detectable in upper and lower respiratory specimens during the acute phase of infection. The lowest concentration of SARS-CoV-2 viral copies this assay can detect is 250 copies / mL. A negative result does not preclude SARS-CoV-2 infection and should not be used as the sole basis for treatment or other patient management decisions.  A negative result may occur with improper specimen collection / handling, submission of specimen other than nasopharyngeal swab, presence of viral mutation(s) within the areas targeted by this assay, and inadequate number of viral copies (<250 copies / mL). A negative result must be combined with clinical observations, patient history, and epidemiological information.  Fact Sheet for Patients:   RoadLapTop.co.za  Fact Sheet for Healthcare Providers: http://kim-miller.com/  This test is not yet approved or  cleared by the [place] FDA and has been authorized for detection and/or diagnosis of SARS-CoV-2 by FDA under an Emergency Use Authorization (EUA).  This EUA will remain in effect (meaning this test can be used) for the duration of the COVID-19 declaration under Section 564(b)(1) of the Act, 21 U.S.C. section 360bbb-3(b)(1), unless the authorization is terminated or revoked sooner.  Performed at [facility], [address] [place], [place] [address]   Troponin I (High Sensitivity)     Status: Abnormal   Collection Time: 09/25/22  8:55 AM  Result Value Ref Range   Troponin I (High Sensitivity) 46 (H) <18 ng/L    Comment: (NOTE) Elevated high sensitivity troponin I  (hsTnI) values and significant  changes across serial measurements may suggest ACS but many other  chronic and acute conditions are known to elevate hsTnI results.  Refer to the "Links" section for chest pain algorithms and additional  guidance. Performed at [facility], [address] [place], [place] [address]   Troponin I (High Sensitivity)     Status: Abnormal   Collection Time: 09/25/22 11:38 AM  Result Value Ref Range   Troponin I (High Sensitivity) 46 (H) <18 ng/L    Comment: (NOTE) Elevated high sensitivity troponin I (hsTnI) values and significant  changes across serial measurements may suggest ACS but many other  chronic and acute conditions are known to elevate hsTnI results.  Refer to the "Links" section for chest pain algorithms and additional  guidance. Performed at [facility], [address] [place], [place] [address]   Protime-INR     Status: None   Collection Time: 09/25/22 11:38 AM  Result Value Ref Range   Prothrombin Time 15.2 11.4 - 15.2 seconds   INR 1.2 0.8 - 1.2    Comment: (NOTE) INR goal varies based on device and disease states. Performed at [facility], [address] [place], [place] [address]   Acetaminophen level     Status: Abnormal   Collection Time: 09/25/22 11:38 AM  Result Value Ref Range   Acetaminophen (Tylenol), Serum <10 (L) 10 - 30 ug/mL    Comment: (NOTE) Therapeutic concentrations vary significantly. A range of 10-30 ug/mL  may be an effective concentration for many patients. However, some  are best treated at concentrations outside of this range. Acetaminophen concentrations >150 ug/mL at 4 hours after ingestion  and >50 ug/mL at 12 hours after ingestion are often associated with  toxic reactions.  Performed at [facility], [address] [place], [place] [address]  DG Chest 2 View  Result Date: 09/25/2022 CLINICAL DATA:  Shortness of breath. EXAM: CHEST - 2 VIEW COMPARISON:   09/19/2022 FINDINGS: Heart size is normal. No pleural fluid, interstitial edema or airspace disease. No pneumothorax identified. There is a sclerotic bone lesion involving the anterior aspect of the right sixth rib. This is similar to the exam from 06/01/2022 and was also noted on remote exam from 1/15/7. IMPRESSION: 1. No active cardiopulmonary disease. 2. Sclerotic bone lesion involving the anterior aspect of the right sixth rib. This is similar to the exam from 06/01/2022 and was also noted on remote exam from 1/15/7. This is favored to represent a benign process such as fibrous dysplasia. Electronically Signed   By: Signa Kell M.D.   On: 09/25/2022 07:27    Pending Labs Unresulted Labs (From admission, onward)     Start     Ordered   09/25/22 1108  Hepatitis panel, acute  Once,   R        09/25/22 1108            Vitals/Pain Today's Vitals   09/25/22 1000 09/25/22 1003 09/25/22 1100 09/25/22 1200  BP: (!) 162/76  (!) 155/71 (!) 146/73  Pulse: (!) 56  (!) 55 (!) 55  Resp: 16  15 17   Temp:  97.8 F (36.6 C)    TempSrc:  Oral    SpO2: 100%  100% 100%  Weight:      Height:      PainSc:        Isolation Precautions No active isolations  Medications Medications  acetaminophen (TYLENOL) tablet 650 mg (has no administration in time range)  ondansetron (ZOFRAN) injection 4 mg (has no administration in time range)  enoxaparin (LOVENOX) injection 40 mg (40 mg Subcutaneous Given 09/25/22 0959)  aspirin chewable tablet 81 mg (0 mg Oral Hold 09/25/22 0958)  atorvastatin (LIPITOR) tablet 40 mg (40 mg Oral Given 09/25/22 0958)  furosemide (LASIX) tablet 20 mg (20 mg Oral Given 09/25/22 0958)  pantoprazole (PROTONIX) EC tablet 20 mg (20 mg Oral Given 09/25/22 0959)  LORazepam (ATIVAN) tablet 1-4 mg (has no administration in time range)    Or  LORazepam (ATIVAN) tablet 0.5 mg (has no administration in time range)  thiamine (VITAMIN B1) tablet 100 mg (100 mg Oral Given 09/25/22 0958)    Or   thiamine (VITAMIN B1) injection 100 mg ( Intravenous See Alternative 09/25/22 0958)  folic acid (FOLVITE) tablet 1 mg (1 mg Oral Given 09/25/22 0958)  multivitamin with minerals tablet 1 tablet (1 tablet Oral Given 09/25/22 0958)  magnesium sulfate IVPB 2 g 50 mL (0 g Intravenous Stopped 09/25/22 0922)  aspirin chewable tablet 324 mg (324 mg Oral Given 09/25/22 0756)  sodium chloride 0.9 % bolus 500 mL (0 mLs Intravenous Stopped 09/25/22 0856)    Mobility walks      R Recommendations: See Admitting Provider Note  Report given to:

## 2022-09-25 NOTE — Assessment & Plan Note (Signed)
Creatinine 1.5 with GFR in the 40s which appears near baseline Monitor

## 2022-09-25 NOTE — Assessment & Plan Note (Signed)
Recurrent nonspecific chest pain over several weeks in the setting of prior history of HFrEF, alcohol abuse, hypertension Troponin 40s which appears to be below prior troponins in the 200s roughly 7 months ago EKG sinus rhythm with fascicular block Will plan to cycle cardiac enzymes overnight Status post full dose aspirin Heart score 6-7 Appears to be recurring issue Will formally consult cardiology as patient may benefit from inpatient stress testing versus further evaluation Follow

## 2022-09-25 NOTE — Plan of Care (Signed)
  Problem: Education: Goal: Understanding of cardiac disease, CV risk reduction, and recovery process will improve Outcome: Progressing   Problem: Activity: Goal: Ability to tolerate increased activity will improve Outcome: Progressing   Problem: Cardiac: Goal: Ability to achieve and maintain adequate cardiovascular perfusion will improve Outcome: Progressing   Problem: Education: Goal: Knowledge of General Education information will improve Description: Including pain rating scale, medication(s)/side effects and non-pharmacologic comfort measures Outcome: Progressing   Problem: Clinical Measurements: Goal: Diagnostic test results will improve Outcome: Progressing   Problem: Pain Managment: Goal: General experience of comfort will improve Outcome: Progressing

## 2022-09-25 NOTE — Progress Notes (Signed)
Patient off unit to nuclear medicine.

## 2022-09-25 NOTE — Assessment & Plan Note (Signed)
AST 246, ALT 162 in the setting of active alcohol use Alcohol level pending Add on Tylenol level and hepatitis panel

## 2022-09-25 NOTE — ED Triage Notes (Signed)
Pt presents to the ED via EMS with multiple complaints. Pt states he has not had anything to eat in a "couple of days." Pt states he had one beer yesterday. Pt c/o pain in his upper extremities, back and shoulders. Pt states he occasionally has SOB.

## 2022-09-25 NOTE — ED Provider Notes (Signed)
[facility] Provider Note    Event Date/Time   First MD Initiated Contact with Patient 09/25/22 [phone]     (approximate)   History   Extremity Pain, Back Pain, and Shortness of Breath   HPI  John Spears is a 72 y.o. male  with h/o HTN, seizures, alcohol abuse, HFrEF here with generalized weakness, body aches, and pain. Pt reports that over the past week, he has had increasingly severe generalized weakness and "awful pain" in his upper arms, neck, and back, which has been "hitting" him in waves. It is increasingly severe and feels like spasms/cramps, but he does not recall any specific triggers, falls, etc. He says the ache are "all over" and feel "like arthritis." He has also noticed some worsening DOE over the past week. No chest pain.     Physical Exam   Triage Vital Signs: ED Triage Vitals  Encounter Vitals Group     BP 09/25/22 0605 (!) 167/80     Systolic BP Percentile --      Diastolic BP Percentile --      Pulse Rate 09/25/22 0605 62     Resp 09/25/22 0605 19     Temp 09/25/22 0605 (!) 97.5 F (36.4 C)     Temp Source 09/25/22 0605 Oral     SpO2 09/25/22 0605 100 %     Weight 09/25/22 0608 175 lb (79.4 kg)     Height 09/25/22 0608 5\' 11"  (1.803 m)     Head Circumference --      Peak Flow --      Pain Score 09/25/22 0607 9     Pain Loc --      Pain Education --      Exclude from Growth Chart --     Most recent vital signs: Vitals:   09/25/22 0605 09/25/22 0754  BP: (!) 167/80 (!) 180/76  Pulse: 62 (!) 59  Resp: 19 (!) 21  Temp: (!) 97.5 F (36.4 C)   SpO2: 100% 100%     General: Awake, no distress.  CV:  Good peripheral perfusion. RRR. No murmurs. Resp:  Normal work of breathing. Slight bibasilar rales, but speaking in full sentences in no distress. Abd:  No distention. No tenderness. No rebound or guarding. Other:  Mild diffuse TTP throughout C,T,L paraspinal muscles, but no appreciable midline tenderness, no asymmetric  tone/spasm, or other abnormalities. Strength 5/5 bl UE and LE. Normal sensation to light touch.   ED Results / Procedures / Treatments   Labs (all labs ordered are listed, but only abnormal results are displayed) Labs Reviewed  CBC WITH DIFFERENTIAL/PLATELET - Abnormal; Notable for the following components:      Result Value   RBC 3.19 (*)    Hemoglobin 9.7 (*)    HCT 29.0 (*)    Platelets 129 (*)    All other components within normal limits  COMPREHENSIVE METABOLIC PANEL - Abnormal; Notable for the following components:   CO2 18 (*)    Creatinine, Ser 1.52 (*)    Calcium 8.6 (*)    Total Protein 5.7 (*)    Albumin 2.9 (*)    AST 246 (*)    ALT 162 (*)    GFR, Estimated 49 (*)    All other components within normal limits  MAGNESIUM - Abnormal; Notable for the following components:   Magnesium 1.5 (*)    All other components within normal limits  BRAIN NATRIURETIC PEPTIDE - Abnormal; Notable for the  following components:   B Natriuretic Peptide 233.1 (*)    All other components within normal limits  URINALYSIS, ROUTINE W REFLEX MICROSCOPIC - Abnormal; Notable for the following components:   Color, Urine YELLOW (*)    APPearance CLEAR (*)    Ketones, ur 20 (*)    All other components within normal limits  TROPONIN I (HIGH SENSITIVITY) - Abnormal; Notable for the following components:   Troponin I (High Sensitivity) 46 (*)    All other components within normal limits  SARS CORONAVIRUS 2 BY RT PCR  TROPONIN I (HIGH SENSITIVITY)     EKG Normal sinus rhythm, VR 63. PR 203, QRS 94, QTc 454. No acute ST elevations or depressions. No ischemia.   RADIOLOGY Pending   I also independently reviewed and agree with radiologist interpretations.   PROCEDURES:  Critical Care performed: No   MEDICATIONS ORDERED IN ED: Medications  magnesium sulfate IVPB 2 g 50 mL (2 g Intravenous New Bag/Given 09/25/22 0758)  aspirin chewable tablet 324 mg (324 mg Oral Given 09/25/22 0756)   sodium chloride 0.9 % bolus 500 mL (0 mLs Intravenous Stopped 09/25/22 0856)     IMPRESSION / MDM / ASSESSMENT AND PLAN / ED COURSE  I reviewed the triage vital signs and the nursing notes.                              Differential diagnosis includes, but is not limited to, myalgias, arthralgias, polyarthritis, electrolyte abnormality, DDD, rigors from underlying occult infection such as PNA or UTI  Patient's presentation is most consistent with acute presentation with potential threat to life or bodily function.  The patient is on the cardiac monitor to evaluate for evidence of arrhythmia and/or significant heart rate changes  72 yo M here with generalized body aches, fatigue. Broad labs are pending and CXR ordered. DDx includes dehydrationh with lyte abnormality, occult infection, possible atypical angina or CHF sx.    FINAL CLINICAL IMPRESSION(S) / ED DIAGNOSES   Final diagnoses:  Chest pain, unspecified type  AKI (acute kidney injury) (HCC)     Rx / DC Orders   ED Discharge Orders     None        Note:  This document was prepared using Dragon voice recognition software and may include unintentional dictation errors.   Shaune Pollack, MD 09/25/22 [phone]

## 2022-09-25 NOTE — Consult Note (Addendum)
Cardiology Consultation   Patient ID: John Spears MRN: [id number]; DOB: 1950/09/25  Admit date: 09/25/2022 Date of Consult: 09/25/2022  PCP:  [facility]   [facility] Providers Cardiologist:  John Vinal, MD        Patient Profile:   John Spears is a 72 y.o. male with a hx of cardiomyopathy, chronic heart failure with improved ejection fraction, hypertension, seizure disorder, and alcohol abuse who is being seen 09/25/2022 for the evaluation of chest pain at the request of Dr. Alvester Morin.  History of Present Illness:   John Spears was previously admitted to [facility] in December 2023 after being found unresponsive by a friend.  He was initially intubated requiring pressor support there was concern that he may have had seizures prior to arrival.  Stroke workup was unremarkable.  Echocardiogram revealed an LVEF of 35-40% with moderate asymmetric hypertrophy of the basal septum and mid/apical anterior/anterior lateral hypokinesis.  Troponin peaked at 291.  He was seen by cardiology and placed on GDMT including beta-blocker, Entresto, spironolactone, daily Lasix.  He was not felt to be a good candidate for diagnostic catheterization due to ongoing encephalopathy.  He was treated for aspiration pneumonia and subsequently discharged to follow-up in clinic early April.  Patient and family prefer to hold off on ischemic testing at that time due to finances.  Recent emergency department visit in April with abdominal pain.  He was found to be BUN of 60 with a creatinine of 2.31 and potassium of 5.6.  Entresto, spironolactone, furosemide were placed on hold.  Abdominal imaging was unremarkable and he was subsequently discharged.  Follow-up lab work in Jun 24, 2022 showed improvement in BUN/creatinine of 40/1.66 with normal potassium of 4.8.  Follow-up echocardiogram showed improvement in EF of 60-65%, mild LVH, G1 DD, trivial MR.  Patient was advised to remain off of  spironolactone and Entresto.  He was last seen in clinic 08/05/2022 at that time he had done well.  Does not routinely exercise.  Denied any chest pains.  Continued to drink and is not contemplating quitting.  Previously he had been restarted by his significant other on his Entresto, spironolactone, and potassium supplements along with his regular medication at her appointment and these medications were encouraged to be discontinued.  Patient continues to defer ischemic evaluation and he was continued on beta-blocker therapy.  He presented to the [facility] emergency department on 09/19/2022 with complaint of chest pain.  Blood pressure was noted to be uncontrolled at 194/78.  Hemoglobin was 8.7, potassium 2.9, BUN of 7, troponins were negative x 2, he was given a GI cocktail as GERD was suspected.  To be nontoxic and was discharged with outpatient follow-up.  He presented back to the [facility] emergency department today with extremity pain, back pain, and shortness of breath.  He reported over the past week he has had increasing generalized weakness and  pain in his upper arms and neck and back which has been hitting him in waves.  It is increasingly severe and feels like spasms and cramps but he does not recall any specific triggers, and no aggravating or alleviating factors.  He has complaints of chest discomfort that he complains is being heavy and hard and tightness that radiates from the left to the right of his chest.  He said that just symptom burden has been ongoing off and on for the past several weeks.  He states that it is worse with lying flat in bed and is  relieved with sitting up.  He has noted some dyspnea on exertion and swelling to his bilateral lower extremities primarily his feet that has improved over the past several days.  He states that he has been compliant with his medications.  Initial vital signs: Blood pressure 167/80, pulse of 62, respirations 19, temperature 97.5  Pertinent labs: Hemoglobin  9.7, hematocrit 29.0, platelets 129, CO2 of 18, serum creatinine 1.52, calcium 8.6, albumin 2.9, AST 246, ALT 162, GFR 49, magnesium 1.5, BNP 233.1, high-sensitivity troponin 46, COVID-19 PCR negative  Imaging: Chest x-ray revealed no acute cardiopulmonary disease, sclerotic lesion involving the anterior aspect of the right sixth rib  Medications administered in the emergency department: Magnesium 2 g IVPB, aspirin 324 mg, and normal saline bolus of 500 mL   Past Medical History:  Diagnosis Date   Alcohol abuse    Heart failure with improved ejection fraction (HFimpEF) (HCC) 02/2022   a. 02/2019 Echo: EF 60-65%, GrI DD; b. 01/2022 Echo: EF 30-35%; c. 06/2022 Echo: EF 60-65%, mild LVH, RI DD, nl RV fxn, triv MR.   Hypertension    Seizure disorder [facility])     Past Surgical History:  Procedure Laterality Date   JOINT REPLACEMENT       Home Medications:  Prior to Admission medications   Medication Sig Start Date End Date Taking? Authorizing Provider  aspirin 81 MG chewable tablet Chew 1 tablet (81 mg total) by mouth daily. 03/04/22 03/04/23  Gillis Santa, MD  atorvastatin (LIPITOR) 40 MG tablet Take 40 mg by mouth daily.    [provider]  carvedilol (COREG) 25 MG tablet Take 1 tablet (25 mg total) by mouth 2 (two) times daily with a meal. 03/03/22 03/03/23  Gillis Santa, MD  cyanocobalamin (VITAMIN B12) 1000 MCG tablet Take 1,000 mcg by mouth daily. 07/16/22   [provider]  FEROSUL 325 (65 Fe) MG tablet Take 325 mg by mouth daily with breakfast. 07/16/22   [provider]  folic acid (FOLVITE) 1 MG tablet Take 1 mg by mouth daily. 07/16/22   [provider]  furosemide (LASIX) 20 MG tablet Take 1 tablet (20 mg total) by mouth daily. 08/13/22 08/13/23  Creig Hines, NP  guaiFENesin-dextromethorphan (ROBITUSSIN DM) 100-10 MG/5ML syrup Take 5 mLs by mouth every 4 (four) hours as needed for cough. Patient not taking: Reported on 04/03/2022 03/03/22   Gillis Santa, MD  iron polysaccharides (NIFEREX) 150 MG capsule Take 1 capsule (150 mg total) by mouth daily. 03/04/22 08/05/22  Gillis Santa, MD  levETIRAcetam (KEPPRA) 1000 MG tablet Take 1 tablet (1,000 mg total) by mouth 2 (two) times daily. Patient not taking: Reported on 08/05/2022 03/03/22 03/03/23  Gillis Santa, MD  magnesium oxide (MAG-OX) 400 (240 Mg) MG tablet Take 1 tablet by mouth daily. 07/16/22   [provider]  pantoprazole (PROTONIX) 20 MG tablet Take 1 tablet (20 mg total) by mouth daily. 09/19/22 09/19/23  Willy Eddy, MD  thiamine (VITAMIN B1) 100 MG tablet Take 100 mg by mouth daily. 07/16/22   [provider]    Inpatient Medications: Scheduled Meds:  aspirin  81 mg Oral Daily   atorvastatin  40 mg Oral Daily   enoxaparin (LOVENOX) injection  40 mg Subcutaneous Q24H   folic acid  1 mg Oral Daily   furosemide  20 mg Oral Daily   multivitamin with minerals  1 tablet Oral Daily   pantoprazole  20 mg Oral Daily   thiamine  100 mg Oral Daily  Or   thiamine  100 mg Intravenous Daily   Continuous Infusions:  PRN Meds: acetaminophen, LORazepam **OR** LORazepam, ondansetron (ZOFRAN) IV  Allergies:   No Known Allergies  Social History:   Social History   Socioeconomic History   Marital status: Single    Spouse name: Not on file   Number of children: Not on file   Years of education: Not on file   Highest education level: Not on file  Occupational History   Not on file  Tobacco Use   Smoking status: Former   Smokeless tobacco: Never  Vaping Use   Vaping status: Never Used  Substance and Sexual Activity   Alcohol use: Yes    Comment: Pint of liquor week   Drug use: Not Currently    Types: Marijuana   Sexual activity: Not on file  Other Topics Concern   Not on file  Social History Narrative   Not on file   Social Determinants of Health   Financial Resource Strain: Not on file  Food Insecurity: Not on file  Transportation Needs: Not on  file  Physical Activity: Not on file  Stress: Not on file  Social Connections: Not on file  Intimate Partner Violence: Not on file    Family History:    Family History  Problem Relation Age of Onset   Heart attack Mother 42     ROS:  Please see the history of present illness.  Review of Systems  Constitutional:  Positive for malaise/fatigue.  Respiratory:  Positive for shortness of breath.   Cardiovascular:  Positive for chest pain and leg swelling.  Gastrointestinal:  Positive for nausea.  Musculoskeletal:  Positive for back pain, joint pain and neck pain.  Neurological:  Positive for weakness.    All other ROS reviewed and negative.     Physical Exam/Data:   Vitals:   09/25/22 0605 09/25/22 0608 09/25/22 0754  BP: (!) 167/80  (!) 180/76  Pulse: 62  (!) 59  Resp: 19  (!) 21  Temp: (!) 97.5 F (36.4 C)    TempSrc: Oral    SpO2: 100%  100%  Weight:  79.4 kg   Height:  5\' 11"  (1.803 m)     Intake/Output Summary (Last 24 hours) at 09/25/2022 0943 Last data filed at 09/25/2022 0856 Gross per 24 hour  Intake 500 ml  Output --  Net 500 ml      09/25/2022    6:08 AM 09/19/2022    8:31 AM 08/05/2022   10:09 AM  Last 3 Weights  Weight (lbs) 175 lb 180 lb 167 lb 4 oz  Weight (kg) 79.379 kg 81.647 kg 75.864 kg     Body mass index is 24.41 kg/m.  General:  Well nourished, well developed, in no acute distress HEENT: normal Neck: no JVD Vascular: No carotid bruits; Distal pulses 2+ bilaterally Cardiac:  normal S1, S2; RRR; no murmur  Lungs:  diminished to auscultation bilaterally, no wheezing, rhonchi or rales, respirations are unlabored on room air  Abd: soft, nontender, no hepatomegaly  Ext: 1+ edema to feet/ankles Musculoskeletal:  No deformities, BUE and BLE strength normal and equal Skin: warm and dry  Neuro:  CNs 2-12 intact, no focal abnormalities noted Psych:  Normal affect   EKG:  The EKG was personally reviewed and demonstrates: Sinus rhythm with a  first-degree AV block, rate of 63, left anterior fascicular block Telemetry:  Telemetry was personally reviewed and demonstrates: Sinus bradycardia rate in 50s with unifocal PVCs  on occasion  Relevant CV Studies: Limited TTE 06/24/22 1. Left ventricular ejection fraction, by estimation, is 60 to 65%. The  left ventricle has normal function. There is mild left ventricular  hypertrophy. Left ventricular diastolic parameters are consistent with  Grade I diastolic dysfunction (impaired  relaxation).   2. Right ventricular systolic function is normal.   3. The mitral valve is normal in structure. Trivial mitral valve  regurgitation.   Laboratory Data:  High Sensitivity Troponin:   Recent Labs  Lab 09/19/22 0831 09/19/22 1050 09/25/22 0633 09/25/22 0855  TROPONINIHS 12 12 46* 46*     Chemistry Recent Labs  Lab 09/19/22 0831 09/25/22 0633  NA 140 137  K 2.9* 3.9  CL 111 107  CO2 20* 18*  GLUCOSE 79 82  BUN 7* 20  CREATININE 0.82 1.52*  CALCIUM 8.2* 8.6*  MG  --  1.5*  GFRNONAA >60 49*  ANIONGAP 9 12    Recent Labs  Lab 09/19/22 0831 09/25/22 0633  PROT 5.5* 5.7*  ALBUMIN 2.8* 2.9*  AST 15 246*  ALT 8 162*  ALKPHOS 45 48  BILITOT 0.8 1.1   Lipids No results for input(s): "CHOL", "TRIG", "HDL", "LABVLDL", "LDLCALC", "CHOLHDL" in the last 168 hours.  Hematology Recent Labs  Lab 09/19/22 0831 09/25/22 0633  WBC 6.4 6.9  RBC 2.88* 3.19*  HGB 8.7* 9.7*  HCT 26.8* 29.0*  MCV 93.1 90.9  MCH 30.2 30.4  MCHC 32.5 33.4  RDW 14.6 14.6  PLT 112* 129*   Thyroid No results for input(s): "TSH", "FREET4" in the last 168 hours.  BNP Recent Labs  Lab 09/25/22 0633  BNP 233.1*    DDimer No results for input(s): "DDIMER" in the last 168 hours.   Radiology/Studies:  DG Chest 2 View  Result Date: 09/25/2022 CLINICAL DATA:  Shortness of breath. EXAM: CHEST - 2 VIEW COMPARISON:  09/19/2022 FINDINGS: Heart size is normal. No pleural fluid, interstitial edema or airspace  disease. No pneumothorax identified. There is a sclerotic bone lesion involving the anterior aspect of the right sixth rib. This is similar to the exam from 06/01/2022 and was also noted on remote exam from 1/15/7. IMPRESSION: 1. No active cardiopulmonary disease. 2. Sclerotic bone lesion involving the anterior aspect of the right sixth rib. This is similar to the exam from 06/01/2022 and was also noted on remote exam from 1/15/7. This is favored to represent a benign process such as fibrous dysplasia. Electronically Signed   By: Signa Kell M.D.   On: 09/25/2022 07:27     Assessment and Plan:   Chest pain/mildly elevated high-sensitivity troponin -Patient with complaints of chest pain waxing waning for the last 2 weeks -High-sensitivity troponin 46 x 3 -Patient previously declined ischemic workup -No ischemic changes noted on EKG -Continues to have complaints of chest discomfort -Lexiscan Myoview stress testing this afternoon with further recommendations to follow -Patient to remain n.p.o. until after testing  HFimpEF -LVEF 60-65% on limited echocardiogram 06/24/2022 -PTA carvedilol on hold due to bradycardia -Continued on furosemide -Previous Entresto and spironolactone to remain on home due to episodes of AKI and hyperkalemia -Patient with NYHA class II symptoms -Regular mild edema to the bilateral lower extremities with complaints of shortness of breath -BNP 233.1 -Chest x-ray with no edema -Daily weights, I's and O's, low-sodium diet  Essential hypertension -Blood pressure 146/73 -Continued on furosemide 20 mg daily -Vital signs per unit protocol  Hyperlipidemia -PTA atorvastatin 40 mg daily restarted on admission by primary team -  Recommend holding due to transaminitis and restart once LFTs have  improved  Seizure disorder -Quiescent -Previously had been on Keppra, no longer taking -Has been evaluated by neurology in the past  Alcohol abuse -Patient drinks approximately  240 ounce beers daily -States he has not drank in 2-3 days -Total cessation has been recommended -Continue with CIWA protocol  Informed Consent   Shared Decision Making/Informed Consent The risks [chest pain, shortness of breath, cardiac arrhythmias, dizziness, blood pressure fluctuations, myocardial infarction, stroke/transient ischemic attack, nausea, vomiting, allergic reaction, radiation exposure, metallic taste sensation and life-threatening complications (estimated to be 1 in 10,000)], benefits (risk stratification, diagnosing coronary artery disease, treatment guidance) and alternatives of a nuclear stress test were discussed in detail with Mr. Willis and he agrees to proceed.     Risk Assessment/Risk Scores:        For questions or updates, please contact [facility] Please consult [email or web address] for contact info under    Signed,  , NP  09/25/2022 9:43 AM

## 2022-09-25 NOTE — H&P (Signed)
History and Physical    Patient: John Spears QQV:[id number] DOB: 1950-10-31 DOA: 09/25/2022 DOS: the patient was seen and examined on 09/25/2022 [facility]CP: [facility]  Patient coming from: Home  Chief Complaint:  Chief Complaint  Patient presents with   Extremity Pain   Back Pain   Shortness of Breath   HPI: John Spears is a 72 y.o. male with medical history significant of HFrEF, seizure disorder, alcohol abuse, hypertension presenting with chest pain, hepatic encephalopathy.  Patient reports nonspecific chest pain over the past at least 3 to 4 weeks.  Chest pain fairly nondescript.  Chest pain moderate in intensity both at rest as well as with movement.  No reported nausea or diaphoresis.  No abdominal pain or diarrhea.  Noted admission in January of this year with patient being found unresponsive requiring intubation.  Noted to have had new onset cardiomyopathy as well as demand ischemia.  Patient states his sister was giving him all his medications at home.  Still drinking at least 2 beers daily.  Patient no longer smoking.  No reported orthopnea or PND.  No hemiparesis or confusion. Presented to the ER afebrile, hemodynamically stable.  White count 6.9, hemoglobin 9.7, platelets 129, troponin in the 40s.  Creatinine 1.52, AST 246, ALT 162.  Creatinine 1.52.  Chest x-ray stable. Review of Systems: As mentioned in the history of present illness. All other systems reviewed and are negative. Past Medical History:  Diagnosis Date   Alcohol abuse    Heart failure with improved ejection fraction (HFimpEF) (HCC) 02/2022   a. 02/2019 Echo: EF 60-65%, GrI DD; b. 01/2022 Echo: EF 30-35%; c. 06/2022 Echo: EF 60-65%, mild LVH, RI DD, nl RV fxn, triv MR.   Hypertension    Seizure disorder [facility])    Past Surgical History:  Procedure Laterality Date   JOINT REPLACEMENT     Social History:  reports that he has quit smoking. He has never used smokeless tobacco. He reports current  alcohol use. He reports that he does not currently use drugs after having used the following drugs: Marijuana.  No Known Allergies  Family History  Problem Relation Age of Onset   Heart attack Mother 39    Prior to Admission medications   Medication Sig Start Date End Date Taking? Authorizing Provider  aspirin 81 MG chewable tablet Chew 1 tablet (81 mg total) by mouth daily. 03/04/22 03/04/23  Gillis Santa, MD  atorvastatin (LIPITOR) 40 MG tablet Take 40 mg by mouth daily.    [provider]  carvedilol (COREG) 25 MG tablet Take 1 tablet (25 mg total) by mouth 2 (two) times daily with a meal. 03/03/22 03/03/23  Gillis Santa, MD  cyanocobalamin (VITAMIN B12) 1000 MCG tablet Take 1,000 mcg by mouth daily. 07/16/22   [provider]  FEROSUL 325 (65 Fe) MG tablet Take 325 mg by mouth daily with breakfast. 07/16/22   [provider]  folic acid (FOLVITE) 1 MG tablet Take 1 mg by mouth daily. 07/16/22   [provider]  furosemide (LASIX) 20 MG tablet Take 1 tablet (20 mg total) by mouth daily. 08/13/22 08/13/23  Creig Hines, NP  guaiFENesin-dextromethorphan (ROBITUSSIN DM) 100-10 MG/5ML syrup Take 5 mLs by mouth every 4 (four) hours as needed for cough. Patient not taking: Reported on 04/03/2022 03/03/22   Gillis Santa, MD  iron polysaccharides (NIFEREX) 150 MG capsule Take 1 capsule (150 mg total) by mouth daily. 03/04/22 08/05/22  Gillis Santa, MD  levETIRAcetam (  KEPPRA) 1000 MG tablet Take 1 tablet (1,000 mg total) by mouth 2 (two) times daily. Patient not taking: Reported on 08/05/2022 03/03/22 03/03/23  Gillis Santa, MD  magnesium oxide (MAG-OX) 400 (240 Mg) MG tablet Take 1 tablet by mouth daily. 07/16/22   [provider]  pantoprazole (PROTONIX) 20 MG tablet Take 1 tablet (20 mg total) by mouth daily. 09/19/22 09/19/23  Willy Eddy, MD  thiamine (VITAMIN B1) 100 MG tablet Take 100 mg by mouth daily. 07/16/22   [provider]     Physical Exam: Vitals:   09/25/22 0605 09/25/22 0608 09/25/22 0754  BP: (!) 167/80  (!) 180/76  Pulse: 62  (!) 59  Resp: 19  (!) 21  Temp: (!) 97.5 F (36.4 C)    TempSrc: Oral    SpO2: 100%  100%  Weight:  79.4 kg   Height:  5\' 11"  (1.803 m)    Physical Exam Constitutional:      Appearance: He is obese.     Comments: Mild generalized lethargy  HENT:     Head: Normocephalic and atraumatic.     Nose: Nose normal.     Mouth/Throat:     Mouth: Mucous membranes are moist.  Eyes:     Pupils: Pupils are equal, round, and reactive to light.  Cardiovascular:     Rate and Rhythm: Normal rate and regular rhythm.  Pulmonary:     Effort: Pulmonary effort is normal.  Abdominal:     General: Bowel sounds are normal.  Musculoskeletal:        General: Normal range of motion.  Neurological:     General: No focal deficit present.  Psychiatric:        Mood and Affect: Mood normal.     Data Reviewed:  There are no new results to review at this time. DG Chest 2 View CLINICAL DATA:  Shortness of breath.  EXAM: CHEST - 2 VIEW  COMPARISON:  09/19/2022  FINDINGS: Heart size is normal. No pleural fluid, interstitial edema or airspace disease. No pneumothorax identified. There is a sclerotic bone lesion involving the anterior aspect of the right sixth rib. This is similar to the exam from 06/01/2022 and was also noted on remote exam from 1/15/7.  IMPRESSION: 1. No active cardiopulmonary disease. 2. Sclerotic bone lesion involving the anterior aspect of the right sixth rib. This is similar to the exam from 06/01/2022 and was also noted on remote exam from 1/15/7. This is favored to represent a benign process such as fibrous dysplasia.  Electronically Signed   By: Signa Kell M.D.   On: 09/25/2022 07:27  Lab Results  Component Value Date   WBC 6.9 09/25/2022   HGB 9.7 (L) 09/25/2022   HCT 29.0 (L) 09/25/2022   MCV 90.9 09/25/2022   PLT 129 (L) 09/25/2022   Last  metabolic panel Lab Results  Component Value Date   GLUCOSE 82 09/25/2022   NA 137 09/25/2022   K 3.9 09/25/2022   CL 107 09/25/2022   CO2 18 (L) 09/25/2022   BUN 20 09/25/2022   CREATININE 1.52 (H) 09/25/2022   GFRNONAA 49 (L) 09/25/2022   CALCIUM 8.6 (L) 09/25/2022   PHOS 3.4 02/27/2022   PROT 5.7 (L) 09/25/2022   ALBUMIN 2.9 (L) 09/25/2022   BILITOT 1.1 09/25/2022   ALKPHOS 48 09/25/2022   AST 246 (H) 09/25/2022   ALT 162 (H) 09/25/2022   ANIONGAP 12 09/25/2022    Assessment and Plan: * Chest pain Recurrent nonspecific  chest pain over several weeks in the setting of prior history of HFrEF, alcohol abuse, hypertension Troponin 40s which appears to be below prior troponins in the 200s roughly 7 months ago EKG sinus rhythm with fascicular block Will plan to cycle cardiac enzymes overnight Status post full dose aspirin Heart score 6-7 Appears to be recurring issue Will formally consult cardiology as patient may benefit from inpatient stress testing versus further evaluation Follow  CKD (chronic kidney disease), stage III (HCC) Creatinine 1.5 with GFR in the 40s which appears near baseline Monitor  HFrEF (heart failure with reduced ejection fraction) (HCC) 2D echo January 2024 with EF of 30 to 35% Appears fairly euvolemic on exam today Continue home regimen pending cardiology evaluation  Encephalopathy Mild generalized confusion with component of hepatic encephalopathy has had alcohol use       Start lactulose Monitor                                                                  Alcohol abuse Patient drinks at least 2 beers daily Mild to moderate encephalopathy on presentation Start CIWA protocol Nutritional supplementation Follow  Seizures (HCC) Patient no longer on Keppra No reported seizure activity Monitor in setting of history of alcohol abuse  Transaminitis AST 246, ALT 162 in the setting of active alcohol use Alcohol level pending Add on Tylenol  level and hepatitis panel   Thrombocytopenia (HCC) Platelet count in the 120s Chronic in the setting alcohol use Monitor      Advance Care Planning:   Code Status: Full Code   Consults: Cardiology   Family Communication: No family at the bedside   Severity of Illness: The appropriate patient status for this patient is OBSERVATION. Observation status is judged to be reasonable and necessary in order to provide the required intensity of service to ensure the patient's safety. The patient's presenting symptoms, physical exam findings, and initial radiographic and laboratory data in the context of their medical condition is felt to place them at decreased risk for further clinical deterioration. Furthermore, it is anticipated that the patient will be medically stable for discharge from the hospital within 2 midnights of admission.   Author: Floydene Flock, MD 09/25/2022 9:39 AM  For on call review [email or web address].

## 2022-09-25 NOTE — Assessment & Plan Note (Signed)
Patient drinks at least 2 beers daily Mild to moderate encephalopathy on presentation Start CIWA protocol Nutritional supplementation Follow

## 2022-09-25 NOTE — Discharge Instructions (Signed)

## 2022-09-25 NOTE — Assessment & Plan Note (Signed)
Platelet count in the 120s Chronic in the setting alcohol use Monitor

## 2022-09-25 NOTE — Progress Notes (Signed)
Stress test Normal perfusion, normal ejection fraction No significant coronary calcification, minimal aortic atherosclerosis Low risk study  Will need to look for noncardiac causes of his chest, shoulder, neck pain Signed, Dossie Arbour, MD, Ph.D [facility]

## 2022-09-25 NOTE — TOC CM/SW Note (Signed)
TOC consulted for SA resources. SA resources added to AVS.  Alfonso Ramus, LCSW Transitions of Care Department [phone]

## 2022-09-25 NOTE — Assessment & Plan Note (Signed)
Mild generalized confusion with component of hepatic encephalopathy has had alcohol use       Start lactulose Monitor

## 2022-09-25 NOTE — Assessment & Plan Note (Signed)
2D echo January 2024 with EF of 30 to 35% Appears fairly euvolemic on exam today Continue home regimen pending cardiology evaluation

## 2022-09-25 NOTE — Progress Notes (Addendum)
Normal stress test today in setting of admission for recurrent CP Will check d dimer x 1 Consider CTA chest vs. VQ scan if markedly + as renal function permits Follow closely

## 2022-09-25 NOTE — Plan of Care (Signed)
  Problem: Education: Goal: Understanding of cardiac disease, CV risk reduction, and recovery process will improve Outcome: Progressing   Problem: Activity: Goal: Ability to tolerate increased activity will improve Outcome: Progressing   Problem: Clinical Measurements: Goal: Ability to maintain clinical measurements within normal limits will improve Outcome: Progressing   Problem: Pain Managment: Goal: General experience of comfort will improve Outcome: Progressing   Problem: Safety: Goal: Ability to remain free from injury will improve Outcome: Progressing

## 2022-09-26 DIAGNOSIS — R0789 Other chest pain: Secondary | ICD-10-CM | POA: Diagnosis not present

## 2022-09-26 DIAGNOSIS — R079 Chest pain, unspecified: Secondary | ICD-10-CM | POA: Diagnosis not present

## 2022-09-26 DIAGNOSIS — N1832 Chronic kidney disease, stage 3b: Secondary | ICD-10-CM

## 2022-09-26 DIAGNOSIS — D696 Thrombocytopenia, unspecified: Secondary | ICD-10-CM | POA: Diagnosis not present

## 2022-09-26 DIAGNOSIS — F1010 Alcohol abuse, uncomplicated: Secondary | ICD-10-CM | POA: Diagnosis not present

## 2022-09-26 LAB — BASIC METABOLIC PANEL
Anion gap: 13 (ref 5–15)
BUN: 19 mg/dL (ref 8–23)
CO2: 19 mmol/L — ABNORMAL LOW (ref 22–32)
Calcium: 8.9 mg/dL (ref 8.9–10.3)
Chloride: 107 mmol/L (ref 98–111)
Creatinine, Ser: 1.29 mg/dL — ABNORMAL HIGH (ref 0.61–1.24)
GFR, Estimated: 60 mL/min — ABNORMAL LOW (ref >60)
Glucose, Bld: 91 mg/dL (ref 70–99)
Potassium: 3.5 mmol/L (ref 3.5–5.1)
Sodium: 139 mmol/L (ref 135–145)

## 2022-09-26 LAB — CBC WITH DIFFERENTIAL/PLATELET
Abs Immature Granulocytes: 0.02 10*3/uL (ref 0.00–0.07)
Basophils Absolute: 0 10*3/uL (ref 0.0–0.1)
Basophils Relative: 1 %
Eosinophils Absolute: 0.3 10*3/uL (ref 0.0–0.5)
Eosinophils Relative: 5 %
HCT: 30.5 % — ABNORMAL LOW (ref 39.0–52.0)
Hemoglobin: 10.3 g/dL — ABNORMAL LOW (ref 13.0–17.0)
Immature Granulocytes: 0 %
Lymphocytes Relative: 32 %
Lymphs Abs: 1.8 10*3/uL (ref 0.7–4.0)
MCH: 30.3 pg (ref 26.0–34.0)
MCHC: 33.8 g/dL (ref 30.0–36.0)
MCV: 89.7 fL (ref 80.0–100.0)
Monocytes Absolute: 0.8 10*3/uL (ref 0.1–1.0)
Monocytes Relative: 14 %
Neutro Abs: 2.7 10*3/uL (ref 1.7–7.7)
Neutrophils Relative %: 48 %
Platelets: 140 10*3/uL — ABNORMAL LOW (ref 150–400)
RBC: 3.4 MIL/uL — ABNORMAL LOW (ref 4.22–5.81)
RDW: 14.8 % (ref 11.5–15.5)
WBC: 5.5 10*3/uL (ref 4.0–10.5)
nRBC: 0 % (ref 0.0–0.2)

## 2022-09-26 LAB — MAGNESIUM: Magnesium: 1.8 mg/dL (ref 1.7–2.4)

## 2022-09-26 MED ORDER — SODIUM CHLORIDE 0.9 % IV BOLUS
500.0000 mL | Freq: Once | INTRAVENOUS | Status: AC
  Administered 2022-09-26: 500 mL via INTRAVENOUS

## 2022-09-26 MED ORDER — POTASSIUM CHLORIDE CRYS ER 20 MEQ PO TBCR
40.0000 meq | EXTENDED_RELEASE_TABLET | Freq: Once | ORAL | Status: AC
  Administered 2022-09-26: 40 meq via ORAL
  Filled 2022-09-26: qty 2

## 2022-09-26 NOTE — Discharge Summary (Signed)
Physician Discharge Summary   Patient: John Spears MRN: [id number] DOB: 1951/01/21  Admit date:     09/25/2022  Discharge date: 09/26/22  Discharge Physician: Delfino Lovett   [facility]P: [facility]   Recommendations at discharge:   Follow-up with outpatient providers as requested  Discharge Diagnoses: Principal Problem:   Chest pain Active Problems:   Thrombocytopenia (HCC)   Transaminitis   Seizures (HCC)   Alcohol abuse   Encephalopathy   HFrEF (heart failure with reduced ejection fraction) (HCC)   CKD (chronic kidney disease), stage III [facility])  Hospital Course: Assessment and Plan: * Chest pain likely noncardiac Recurrent nonspecific chest pain over several weeks in the setting of prior history of HFrEF, alcohol abuse, hypertension Troponin 40s which appears to be below prior troponins in the 200s roughly 7 months ago EKG sinus rhythm with fascicular block Seen by cardiology.  Normal stress test. CT angio chest negative for PE.  CKD (chronic kidney disease), stage III (HCC) Creatinine 1.5 with GFR in the 40s which appears near baseline  HFrEF (heart failure with reduced ejection fraction) (HCC) 2D echo January 2024 with EF of 30 to 35% Appears fairly euvolemic on exam  Continue home regimen   Encephalopathy Mild generalized confusion with component of hepatic encephalopathy has had alcohol use       Resolved.  He is at baseline                                                             Alcohol abuse Patient drinks at least 2 beers daily Mild to moderate encephalopathy on presentation.  It is resolved.  He is wanting to go home  Seizures [facility]) Patient no longer on Keppra No reported seizure activity  Transaminitis AST 246, ALT 162 in the setting of active alcohol use  Thrombocytopenia (HCC) Platelet count stable.  This is likely in the alcohol/cirrhosis setting  Patient is requesting to go home.        Consultants:  Cardiology Disposition: Home Diet recommendation:  Discharge Diet Orders (From admission, onward)     Start     Ordered   09/26/22 0000  Diet - low sodium heart healthy        09/26/22 0840           Carb modified diet DISCHARGE MEDICATION: Allergies as of 09/26/2022   No Known Allergies      Medication List     STOP taking these medications    guaiFENesin-dextromethorphan 100-10 MG/5ML syrup Commonly known as: ROBITUSSIN DM   levETIRAcetam 1000 MG tablet Commonly known as: KEPPRA       TAKE these medications    aspirin 81 MG chewable tablet Chew 1 tablet (81 mg total) by mouth daily.   atorvastatin 40 MG tablet Commonly known as: LIPITOR Take 40 mg by mouth daily.   carvedilol 25 MG tablet Commonly known as: COREG Take 1 tablet (25 mg total) by mouth 2 (two) times daily with a meal.   cyanocobalamin 1000 MCG tablet Commonly known as: VITAMIN B12 Take 1,000 mcg by mouth daily.   FeroSul 325 (65 Fe) MG tablet Generic drug: ferrous sulfate Take 325 mg by mouth daily with breakfast.   folic acid 1 MG tablet Commonly known as: FOLVITE Take 1 mg by mouth  daily.   furosemide 20 MG tablet Commonly known as: LASIX Take 1 tablet (20 mg total) by mouth daily.   iron polysaccharides 150 MG capsule Commonly known as: NIFEREX Take 1 capsule (150 mg total) by mouth daily.   magnesium oxide 400 (240 Mg) MG tablet Commonly known as: MAG-OX Take 1 tablet by mouth daily.   pantoprazole 20 MG tablet Commonly known as: Protonix Take 1 tablet (20 mg total) by mouth daily.   thiamine 100 MG tablet Commonly known as: VITAMIN B1 Take 100 mg by mouth daily.        Follow-up Information     [facility]. Schedule an appointment as soon as possible for a visit in 1 week(s).   Specialty: General Practice Why: [facility] Discharge F/UP Contact information: [address] [place] [address] [phone]                 Discharge Exam: Ceasar Mons Weights   09/25/22 9147 09/25/22 1336  Weight: 79.4 kg 58.57 kg   72 year old male lying in the bed comfortably without any acute distress HENT:     Head: Normocephalic and atraumatic.     Nose: Nose normal.     Mouth/Throat:     Mouth: Mucous membranes are moist.  Eyes:     Pupils: Pupils are equal, round, and reactive to light.  Cardiovascular:     Rate and Rhythm: Normal rate and regular rhythm.  Pulmonary:     Effort: Pulmonary effort is normal.  Abdominal:     General: Bowel sounds are normal.  Musculoskeletal:        General: Normal range of motion.  Neurological:     General: No focal deficit present.  Psychiatric:        Mood and Affect: Mood normal.   Condition at discharge: fair  The results of significant diagnostics from this hospitalization (including imaging, microbiology, ancillary and laboratory) are listed below for reference.   Imaging Studies: CT Angio Chest Pulmonary Embolism (PE) W or WO Contrast  Result Date: 09/25/2022 CLINICAL DATA:  Upper extremity pain, back pain, short of breath EXAM: CT ANGIOGRAPHY CHEST WITH CONTRAST TECHNIQUE: Multidetector CT imaging of the chest was performed using the standard protocol during bolus administration of intravenous contrast. Multiplanar CT image reconstructions and MIPs were obtained to evaluate the vascular anatomy. RADIATION DOSE REDUCTION: This exam was performed according to the departmental dose-optimization program which includes automated exposure control, adjustment of the mA and/or kV according to patient size and/or use of iterative reconstruction technique. CONTRAST:  75mL OMNIPAQUE IOHEXOL 350 MG/ML SOLN COMPARISON:  09/25/2022 FINDINGS: Cardiovascular: This is a technically adequate evaluation of the pulmonary vasculature. No filling defects or pulmonary emboli. The heart is unremarkable without pericardial effusion. Normal caliber of the thoracic aorta. Evaluation of the  aortic lumen is limited due to timing of contrast bolus on this examination performed to maximize the opacification of the pulmonary vasculature to exclude pulmonary emboli. Aortic atherosclerosis. Mediastinum/Nodes: No enlarged mediastinal, hilar, or axillary lymph nodes. Thyroid gland, trachea, and esophagus demonstrate no significant findings. Lungs/Pleura: No acute airspace disease, effusion, or pneumothorax. Bilateral bronchial wall thickening, greatest within the lower lobes. Central airways are patent. Upper Abdomen: No acute abnormality. Musculoskeletal: No acute or destructive bony abnormalities. Stable sclerosis within the right anterolateral sixth rib consistent with benign process such as bone island or fibrous dysplasia. Reconstructed images demonstrate no additional findings. Review of the MIP images confirms the above findings. IMPRESSION: 1.  No evidence of pulmonary embolus. 2. No acute intrathoracic process. 3.  Aortic Atherosclerosis (ICD10-I70.0). Electronically Signed   By: Sharlet Salina M.D.   On: 09/25/2022 22:41   NM Myocar Multi W/Spect W/Wall Motion / EF  Result Date: 09/25/2022 Pharmacological myocardial perfusion imaging study with no significant  ischemia Normal wall motion, EF estimated at 67% No EKG changes concerning for ischemia at peak stress or in recovery. CT attenuation correction images with no significant coronary calcification, minimal aortic atherosclerosis Low risk scan Signed, Dossie Arbour, MD, Ph.D [facility]   DG Chest 2 View  Result Date: 09/25/2022 CLINICAL DATA:  Shortness of breath. EXAM: CHEST - 2 VIEW COMPARISON:  09/19/2022 FINDINGS: Heart size is normal. No pleural fluid, interstitial edema or airspace disease. No pneumothorax identified. There is a sclerotic bone lesion involving the anterior aspect of the right sixth rib. This is similar to the exam from 06/01/2022 and was also noted on remote exam from 1/15/7. IMPRESSION: 1. No active cardiopulmonary  disease. 2. Sclerotic bone lesion involving the anterior aspect of the right sixth rib. This is similar to the exam from 06/01/2022 and was also noted on remote exam from 1/15/7. This is favored to represent a benign process such as fibrous dysplasia. Electronically Signed   By: Signa Kell M.D.   On: 09/25/2022 07:27   DG Chest Portable 1 View  Result Date: 09/19/2022 CLINICAL DATA:  72 year old male with history of chest pain. EXAM: PORTABLE CHEST 1 VIEW COMPARISON:  Chest x-ray 04/03/2022. FINDINGS: Lung volumes are normal. No consolidative airspace disease. No pleural effusions. No pneumothorax. No pulmonary nodule or mass noted. Pulmonary vasculature and the cardiomediastinal silhouette are within normal limits. IMPRESSION: No radiographic evidence of acute cardiopulmonary disease. Electronically Signed   By: Trudie Reed M.D.   On: 09/19/2022 08:45    Microbiology: Results for orders placed or performed during the hospital encounter of 09/25/22  SARS Coronavirus 2 by RT PCR (hospital order, performed in [facility] hospital lab) *cepheid single result test* Anterior Nasal Swab     Status: None   Collection Time: 09/25/22  7:01 AM   Specimen: Anterior Nasal Swab  Result Value Ref Range Status   SARS Coronavirus 2 by RT PCR NEGATIVE NEGATIVE Final    Comment: (NOTE) SARS-CoV-2 target nucleic acids are NOT DETECTED.  The SARS-CoV-2 RNA is generally detectable in upper and lower respiratory specimens during the acute phase of infection. The lowest concentration of SARS-CoV-2 viral copies this assay can detect is 250 copies / mL. A negative result does not preclude SARS-CoV-2 infection and should not be used as the sole basis for treatment or other patient management decisions.  A negative result may occur with improper specimen collection / handling, submission of specimen other than nasopharyngeal swab, presence of viral mutation(s) within the areas targeted by this assay, and  inadequate number of viral copies (<250 copies / mL). A negative result must be combined with clinical observations, patient history, and epidemiological information.  Fact Sheet for Patients:   RoadLapTop.co.za  Fact Sheet for Healthcare Providers: http://kim-miller.com/  This test is not yet approved or  cleared by the [place] FDA and has been authorized for detection and/or diagnosis of SARS-CoV-2 by FDA under an Emergency Use Authorization (EUA).  This EUA will remain in effect (meaning this test can be used) for the duration of the COVID-19 declaration under Section 564(b)(1) of the Act, 21 U.S.C. section 360bbb-3(b)(1), unless the authorization is terminated or revoked sooner.  Performed at [facility], [address] [place], [place] [address]     Labs: CBC: Recent Labs  Lab 09/25/22 [phone] 09/26/22 0435  WBC 6.9 5.5  NEUTROABS 4.0 2.7  HGB 9.7* 10.3*  HCT 29.0* 30.5*  MCV 90.9 89.7  PLT 129* 140*   Basic Metabolic Panel: Recent Labs  Lab 09/25/22 0633 09/25/22 2040 09/26/22 0435  NA 137 139 139  K 3.9 3.3* 3.5  CL 107 108 107  CO2 18* 21* 19*  GLUCOSE 82 101* 91  BUN 20 20 19   CREATININE 1.52* 1.36* 1.29*  CALCIUM 8.6* 8.4* 8.9  MG 1.5*  --  1.8   Liver Function Tests: Recent Labs  Lab 09/25/22 0633  AST 246*  ALT 162*  ALKPHOS 48  BILITOT 1.1  PROT 5.7*  ALBUMIN 2.9*   CBG: No results for input(s): "GLUCAP" in the last 168 hours.  Discharge time spent: greater than 30 minutes.  Signed: Delfino Lovett, MD [facility] 09/26/2022

## 2022-11-05 ENCOUNTER — Ambulatory Visit: Payer: 59 | Attending: Internal Medicine | Admitting: Internal Medicine

## 2022-11-06 ENCOUNTER — Encounter: Payer: Self-pay | Admitting: Internal Medicine

## 2022-11-09 ENCOUNTER — Other Ambulatory Visit: Payer: Self-pay | Admitting: Nurse Practitioner

## 2022-12-21 ENCOUNTER — Emergency Department (HOSPITAL_COMMUNITY): Payer: 59

## 2022-12-21 ENCOUNTER — Inpatient Hospital Stay (HOSPITAL_COMMUNITY): Payer: 59

## 2022-12-21 ENCOUNTER — Inpatient Hospital Stay (HOSPITAL_COMMUNITY)
Admission: EM | Admit: 2022-12-21 | Discharge: 2022-12-30 | DRG: 100 | Disposition: A | Discharge status: Skilled Nursing Facility | Payer: 59 | Attending: Family Medicine | Admitting: Family Medicine

## 2022-12-21 ENCOUNTER — Encounter (HOSPITAL_COMMUNITY): Payer: Self-pay | Admitting: Pulmonary Disease

## 2022-12-21 DIAGNOSIS — G9340 Encephalopathy, unspecified: Secondary | ICD-10-CM | POA: Diagnosis not present

## 2022-12-21 DIAGNOSIS — Z7409 Other reduced mobility: Secondary | ICD-10-CM | POA: Diagnosis present

## 2022-12-21 DIAGNOSIS — T426X6A Underdosing of other antiepileptic and sedative-hypnotic drugs, initial encounter: Secondary | ICD-10-CM | POA: Diagnosis present

## 2022-12-21 DIAGNOSIS — Z91148 Patient's other noncompliance with medication regimen for other reason: Secondary | ICD-10-CM | POA: Diagnosis not present

## 2022-12-21 DIAGNOSIS — F1010 Alcohol abuse, uncomplicated: Secondary | ICD-10-CM

## 2022-12-21 DIAGNOSIS — R569 Unspecified convulsions: Secondary | ICD-10-CM

## 2022-12-21 DIAGNOSIS — J9601 Acute respiratory failure with hypoxia: Secondary | ICD-10-CM

## 2022-12-21 DIAGNOSIS — F10239 Alcohol dependence with withdrawal, unspecified: Secondary | ICD-10-CM | POA: Diagnosis present

## 2022-12-21 DIAGNOSIS — Y900 Blood alcohol level of less than 20 mg/100 ml: Secondary | ICD-10-CM | POA: Diagnosis present

## 2022-12-21 DIAGNOSIS — G8384 Todd's paralysis (postepileptic): Secondary | ICD-10-CM | POA: Diagnosis not present

## 2022-12-21 DIAGNOSIS — J690 Pneumonitis due to inhalation of food and vomit: Secondary | ICD-10-CM | POA: Diagnosis present

## 2022-12-21 DIAGNOSIS — Y92239 Unspecified place in hospital as the place of occurrence of the external cause: Secondary | ICD-10-CM | POA: Diagnosis not present

## 2022-12-21 DIAGNOSIS — E440 Moderate protein-calorie malnutrition: Secondary | ICD-10-CM | POA: Insufficient documentation

## 2022-12-21 DIAGNOSIS — R1310 Dysphagia, unspecified: Secondary | ICD-10-CM | POA: Diagnosis not present

## 2022-12-21 DIAGNOSIS — Z8673 Personal history of transient ischemic attack (TIA), and cerebral infarction without residual deficits: Secondary | ICD-10-CM | POA: Diagnosis not present

## 2022-12-21 DIAGNOSIS — Z79899 Other long term (current) drug therapy: Secondary | ICD-10-CM

## 2022-12-21 DIAGNOSIS — Z6825 Body mass index (BMI) 25.0-25.9, adult: Secondary | ICD-10-CM | POA: Diagnosis not present

## 2022-12-21 DIAGNOSIS — E876 Hypokalemia: Secondary | ICD-10-CM | POA: Diagnosis not present

## 2022-12-21 DIAGNOSIS — I160 Hypertensive urgency: Secondary | ICD-10-CM | POA: Diagnosis present

## 2022-12-21 DIAGNOSIS — G40919 Epilepsy, unspecified, intractable, without status epilepticus: Secondary | ICD-10-CM

## 2022-12-21 DIAGNOSIS — N400 Enlarged prostate without lower urinary tract symptoms: Secondary | ICD-10-CM | POA: Diagnosis present

## 2022-12-21 DIAGNOSIS — D638 Anemia in other chronic diseases classified elsewhere: Secondary | ICD-10-CM | POA: Diagnosis present

## 2022-12-21 DIAGNOSIS — E874 Mixed disorder of acid-base balance: Secondary | ICD-10-CM | POA: Diagnosis present

## 2022-12-21 DIAGNOSIS — R4182 Altered mental status, unspecified: Principal | ICD-10-CM

## 2022-12-21 DIAGNOSIS — G40509 Epileptic seizures related to external causes, not intractable, without status epilepticus: Principal | ICD-10-CM | POA: Diagnosis present

## 2022-12-21 DIAGNOSIS — W1830XA Fall on same level, unspecified, initial encounter: Secondary | ICD-10-CM | POA: Diagnosis present

## 2022-12-21 DIAGNOSIS — I10 Essential (primary) hypertension: Secondary | ICD-10-CM | POA: Diagnosis present

## 2022-12-21 DIAGNOSIS — F1090 Alcohol use, unspecified, uncomplicated: Secondary | ICD-10-CM

## 2022-12-21 DIAGNOSIS — Y92009 Unspecified place in unspecified non-institutional (private) residence as the place of occurrence of the external cause: Secondary | ICD-10-CM | POA: Diagnosis not present

## 2022-12-21 DIAGNOSIS — N179 Acute kidney failure, unspecified: Secondary | ICD-10-CM | POA: Diagnosis present

## 2022-12-21 DIAGNOSIS — D696 Thrombocytopenia, unspecified: Secondary | ICD-10-CM

## 2022-12-21 HISTORY — DX: Essential (primary) hypertension: I10

## 2022-12-21 HISTORY — DX: Unspecified convulsions: R56.9

## 2022-12-21 LAB — COMPREHENSIVE METABOLIC PANEL
ALT: 8 U/L (ref 0–44)
AST: 35 U/L (ref 15–41)
Albumin: 3.5 g/dL (ref 3.5–5.0)
Alkaline Phosphatase: 57 U/L (ref 38–126)
Anion gap: 22 — ABNORMAL HIGH (ref 5–15)
BUN: 12 mg/dL (ref 8–23)
CO2: 16 mmol/L — ABNORMAL LOW (ref 22–32)
Calcium: 9.2 mg/dL (ref 8.9–10.3)
Chloride: 99 mmol/L (ref 98–111)
Creatinine, Ser: 1.31 mg/dL — ABNORMAL HIGH (ref 0.61–1.24)
GFR, Estimated: 36 mL/min — ABNORMAL LOW (ref >60)
Glucose, Bld: 112 mg/dL — ABNORMAL HIGH (ref 70–99)
Potassium: 4.1 mmol/L (ref 3.5–5.1)
Sodium: 137 mmol/L (ref 135–145)
Total Bilirubin: 0.3 mg/dL (ref 0.3–1.2)
Total Protein: 6.2 g/dL — ABNORMAL LOW (ref 6.5–8.1)

## 2022-12-21 LAB — CBC
HCT: 37.5 % — ABNORMAL LOW (ref 39.0–52.0)
Hemoglobin: 11.8 g/dL — ABNORMAL LOW (ref 13.0–17.0)
MCH: 30.3 pg (ref 26.0–34.0)
MCHC: 31.5 g/dL (ref 30.0–36.0)
MCV: 96.2 fL (ref 80.0–100.0)
Platelets: 144 10*3/uL — ABNORMAL LOW (ref 150–400)
RBC: 3.9 MIL/uL — ABNORMAL LOW (ref 4.22–5.81)
RDW: 14.5 % (ref 11.5–15.5)
WBC: 6.7 10*3/uL (ref 4.0–10.5)
nRBC: 0.6 % — ABNORMAL HIGH (ref 0.0–0.2)

## 2022-12-21 LAB — I-STAT ARTERIAL BLOOD GAS, ED
Acid-base deficit: 4 mmol/L — ABNORMAL HIGH (ref 0.0–2.0)
Bicarbonate: 21.2 mmol/L (ref 20.0–28.0)
Calcium, Ion: 1.14 mmol/L — ABNORMAL LOW (ref 1.15–1.40)
HCT: 33 % — ABNORMAL LOW (ref 39.0–52.0)
Hemoglobin: 11.2 g/dL — ABNORMAL LOW (ref 13.0–17.0)
O2 Saturation: 100 %
Patient temperature: 96.8
Potassium: 3.9 mmol/L (ref 3.5–5.1)
Sodium: 136 mmol/L (ref 135–145)
TCO2: 22 mmol/L (ref 22–32)
pCO2 arterial: 35 mm[Hg] (ref 32–48)
pH, Arterial: 7.385 (ref 7.35–7.45)
pO2, Arterial: 429 mm[Hg] — ABNORMAL HIGH (ref 83–108)

## 2022-12-21 LAB — I-STAT CG4 LACTIC ACID, ED: Lactic Acid, Venous: 12.5 mmol/L (ref 0.5–1.9)

## 2022-12-21 LAB — I-STAT CHEM 8, ED
BUN: 16 mg/dL (ref 8–23)
Calcium, Ion: 1.13 mmol/L — ABNORMAL LOW (ref 1.15–1.40)
Chloride: 103 mmol/L (ref 98–111)
Creatinine, Ser: 1.1 mg/dL (ref 0.61–1.24)
Glucose, Bld: 106 mg/dL — ABNORMAL HIGH (ref 70–99)
HCT: 40 % (ref 39.0–52.0)
Hemoglobin: 13.6 g/dL (ref 13.0–17.0)
Potassium: 4.2 mmol/L (ref 3.5–5.1)
Sodium: 137 mmol/L (ref 135–145)
TCO2: 21 mmol/L — ABNORMAL LOW (ref 22–32)

## 2022-12-21 LAB — URINALYSIS, ROUTINE W REFLEX MICROSCOPIC
Bilirubin Urine: NEGATIVE
Glucose, UA: NEGATIVE mg/dL
Ketones, ur: 20 mg/dL — AB
Nitrite: NEGATIVE
Protein, ur: NEGATIVE mg/dL
Specific Gravity, Urine: 1.042 — ABNORMAL HIGH (ref 1.005–1.030)
pH: 5 (ref 5.0–8.0)

## 2022-12-21 LAB — PROTIME-INR
INR: 1.1 (ref 0.8–1.2)
Prothrombin Time: 14 s (ref 11.4–15.2)

## 2022-12-21 LAB — LACTIC ACID, PLASMA: Lactic Acid, Venous: 4 mmol/L (ref 0.5–1.9)

## 2022-12-21 LAB — CK: Total CK: 77 U/L (ref 49–397)

## 2022-12-21 LAB — SAMPLE TO BLOOD BANK

## 2022-12-21 LAB — CBG MONITORING, ED: Glucose-Capillary: 115 mg/dL — ABNORMAL HIGH (ref 70–99)

## 2022-12-21 LAB — ETHANOL: Alcohol, Ethyl (B): 11 mg/dL — ABNORMAL HIGH (ref <10)

## 2022-12-21 MED ORDER — PROPOFOL 1000 MG/100ML IV EMUL
0.0000 ug/kg/min | INTRAVENOUS | Status: DC
Start: 2022-12-21 — End: 2022-12-22
  Administered 2022-12-21: 40 ug/kg/min via INTRAVENOUS
  Administered 2022-12-21: 35 ug/kg/min via INTRAVENOUS
  Filled 2022-12-21: qty 100

## 2022-12-21 MED ORDER — FENTANYL 2500MCG IN NS 250ML (10MCG/ML) PREMIX INFUSION
25.0000 ug/h | INTRAVENOUS | Status: DC
Start: 2022-12-21 — End: 2022-12-22
  Administered 2022-12-21: 50 ug/h via INTRAVENOUS
  Filled 2022-12-21: qty 250

## 2022-12-21 MED ORDER — FENTANYL BOLUS VIA INFUSION: 25.0000 ug | INTRAVENOUS | Status: DC | PRN

## 2022-12-21 MED ORDER — ORAL CARE MOUTH RINSE: 15.0000 mL | OROMUCOSAL | Status: DC | PRN

## 2022-12-21 MED ORDER — HEPARIN SODIUM (PORCINE) 5000 UNIT/ML IJ SOLN
5000.0000 [IU] | Freq: Three times a day (TID) | INTRAMUSCULAR | Status: DC
Start: 2022-12-21 — End: 2022-12-31
  Administered 2022-12-21 – 2022-12-30 (×28): 5000 [IU] via SUBCUTANEOUS
  Filled 2022-12-21 (×28): qty 1

## 2022-12-21 MED ORDER — SODIUM CHLORIDE 0.9 % IV SOLN
3.0000 g | Freq: Three times a day (TID) | INTRAVENOUS | Status: AC
  Administered 2022-12-21 – 2022-12-24 (×9): 3 g via INTRAVENOUS
  Filled 2022-12-21 (×9): qty 8

## 2022-12-21 MED ORDER — SUCCINYLCHOLINE CHLORIDE 20 MG/ML IJ SOLN
INTRAMUSCULAR | Status: AC | PRN
  Administered 2022-12-21: 140 mg via INTRAVENOUS

## 2022-12-21 MED ORDER — IOHEXOL 350 MG/ML SOLN
75.0000 mL | Freq: Once | INTRAVENOUS | Status: AC | PRN
  Administered 2022-12-21: 75 mL via INTRAVENOUS

## 2022-12-21 MED ORDER — DOCUSATE SODIUM 100 MG PO CAPS: 100.0000 mg | ORAL_CAPSULE | Freq: Two times a day (BID) | ORAL | Status: DC | PRN

## 2022-12-21 MED ORDER — DOCUSATE SODIUM 50 MG/5ML PO LIQD: 100.0000 mg | Freq: Two times a day (BID) | ORAL | Status: DC

## 2022-12-21 MED ORDER — SODIUM CHLORIDE 0.9 % IV SOLN
2000.0000 mg | Freq: Once | INTRAVENOUS | Status: AC
  Administered 2022-12-21: 2000 mg via INTRAVENOUS
  Filled 2022-12-21: qty 20

## 2022-12-21 MED ORDER — POLYETHYLENE GLYCOL 3350 17 G PO PACK
17.0000 g | PACK | Freq: Every day | ORAL | Status: DC
  Filled 2022-12-21: qty 1

## 2022-12-21 MED ORDER — POLYETHYLENE GLYCOL 3350 17 G PO PACK: 17.0000 g | PACK | Freq: Every day | ORAL | Status: DC | PRN

## 2022-12-21 MED ORDER — INSULIN ASPART 100 UNIT/ML IJ SOLN: 1.0000 [IU] | INTRAMUSCULAR | Status: DC

## 2022-12-21 MED ORDER — LACTATED RINGERS IV SOLN: INTRAVENOUS | Status: DC

## 2022-12-21 MED ORDER — ORAL CARE MOUTH RINSE
15.0000 mL | OROMUCOSAL | Status: DC
  Administered 2022-12-21 – 2022-12-22 (×8): 15 mL via OROMUCOSAL

## 2022-12-21 MED ORDER — ETOMIDATE 2 MG/ML IV SOLN
INTRAVENOUS | Status: AC | PRN
  Administered 2022-12-21: 20 mg via INTRAVENOUS

## 2022-12-21 MED ORDER — LORAZEPAM 2 MG/ML IJ SOLN
1.0000 mg | INTRAMUSCULAR | Status: DC | PRN
  Filled 2022-12-21: qty 1

## 2022-12-21 MED ORDER — SODIUM CHLORIDE 0.9 % IV SOLN
1.0000 mg | Freq: Every day | INTRAVENOUS | Status: DC
  Administered 2022-12-21: 1 mg via INTRAVENOUS
  Filled 2022-12-21 (×2): qty 0.2

## 2022-12-21 MED ORDER — CHLORHEXIDINE GLUCONATE CLOTH 2 % EX PADS
6.0000 | MEDICATED_PAD | Freq: Every day | CUTANEOUS | Status: DC
  Administered 2022-12-21 – 2022-12-23 (×3): 6 via TOPICAL

## 2022-12-21 MED ORDER — HYDRALAZINE HCL 20 MG/ML IJ SOLN
10.0000 mg | Freq: Four times a day (QID) | INTRAMUSCULAR | Status: DC | PRN
  Administered 2022-12-23: 10 mg via INTRAVENOUS
  Filled 2022-12-21: qty 1

## 2022-12-21 MED ORDER — LACTATED RINGERS IV BOLUS
1000.0000 mL | Freq: Once | INTRAVENOUS | Status: AC
  Administered 2022-12-21: 1000 mL via INTRAVENOUS

## 2022-12-21 MED ORDER — THIAMINE HCL 100 MG/ML IJ SOLN
100.0000 mg | Freq: Every day | INTRAMUSCULAR | Status: DC
  Administered 2022-12-21: 100 mg via INTRAVENOUS
  Filled 2022-12-21: qty 2

## 2022-12-21 MED ORDER — PANTOPRAZOLE SODIUM 40 MG IV SOLR
40.0000 mg | Freq: Every day | INTRAVENOUS | Status: DC
  Administered 2022-12-21 – 2022-12-22 (×2): 40 mg via INTRAVENOUS
  Filled 2022-12-21 (×2): qty 10

## 2022-12-21 NOTE — Consult Note (Addendum)
NEURO HOSPITALIST CONSULT NOTE   Requesting physician: Dr. Delton Coombes  Reason for Consult: Breakthrough seizure at home.   History obtained from:  Chart and Communication with CCM  HPI:                                                                                                                                          John Spears is an 72 y.o. male with a history of seizures (on Keppra at home) and HTN (on Coreg and Lasix) who presented to the ED this evening as a level 1 trauma after a fall at home. Family noted GTC seizure activity at home prior to him falling. Family left him on the ground for about 3 hours to see if he would recover. He reportedly was able to stand up and then fell again and seized again. He was found by EMS unresponsive, hypertensive SBP 240s. He had another seizure with EMS en route. Of note, he has a history of heavy daily EtOH use consisting of 6-12 beers per day and will also consume an entire bottle of hard liquor in one day if this form of EtOH is available to him, per wife. He arrived with a GCS of 6. EtOH level was 11 in the ED. He has been intubated and is in a hard cervical collar.   Per EDP note: "72 yo M with a cc of AMS. Patient had an episode that sounded like a seizure and then collapsed to the ground. The family thought that he would probably get better and so left him in that state. Reportedly was there for about 3 hours and when he no longer had significant ability to move EMS was called. He had another seizure and collapsed somewhere in there as well. Patient arrived altered. Intubated for airway protection. There was some concern that this could have been traumatic as he did have a fall. Seen by trauma surgery as a level 1. CT imaging without traumatic injury. Discussed with ICU for admission."   Per wife, she gives him his medications to take on a daily basis, but she does not observe him to make sure that he takes them. His BP was in  the 200s on arrival, rasing the possibility that he has not been compliant with his medications after being given them by his wife.    PMHx As per HPI.   No family history on file.           Social History: Heavy daily EtOH use as per HPI.   Not on File  MEDICATIONS:  Prior to Admission:  Medications Prior to Admission  Medication Sig Dispense Refill Last Dose   aspirin EC 81 MG tablet Take 81 mg by mouth daily. Swallow whole.      atorvastatin (LIPITOR) 40 MG tablet Take 40 mg by mouth at bedtime.      carvedilol (COREG) 25 MG tablet Take 25 mg by mouth in the morning and at bedtime.      folic acid (FOLVITE) 1 MG tablet Take 1 mg by mouth daily.      furosemide (LASIX) 20 MG tablet Take 20 mg by mouth daily.      levETIRAcetam (KEPPRA) 1000 MG tablet Take 1,000 mg by mouth 2 (two) times daily.      magnesium oxide (MAG-OX) 400 (240 Mg) MG tablet Take 400 mg by mouth daily.      tamsulosin (FLOMAX) 0.4 MG CAPS capsule Take 0.4 mg by mouth daily.      thiamine (VITAMIN B-1) 100 MG tablet Take 100 mg by mouth daily.      Scheduled:  Chlorhexidine Gluconate Cloth  6 each Topical Daily   docusate  100 mg Per Tube BID   heparin  5,000 Units Subcutaneous Q8H   insulin aspart  1-3 Units Subcutaneous Q4H   mouth rinse  15 mL Mouth Rinse Q2H   pantoprazole (PROTONIX) IV  40 mg Intravenous QHS   polyethylene glycol  17 g Per Tube Daily   thiamine (VITAMIN B1) injection  100 mg Intravenous Daily   Continuous:  ampicillin-sulbactam (UNASYN) IV Stopped (12/21/22 2105)   fentaNYL infusion INTRAVENOUS 75 mcg/hr (12/22/22 0000)   folic acid 1 mg in sodium chloride 0.9 % 50 mL IVPB Stopped (12/21/22 2247)   lactated ringers 100 mL/hr at 12/21/22 1943   propofol (DIPRIVAN) infusion 35 mcg/kg/min (12/22/22 0000)     ROS:                                                                                                                                        Unable to obtain due to intubation.    Blood pressure (!) 146/85, pulse 85, temperature (!) 96.2 F (35.7 C), temperature source Axillary, resp. rate 16, height 5\' 11"  (1.803 m), weight 75 kg, SpO2 100%.   General Examination:                                                                                                       Physical Exam  HEENT-  [place]/AT. Hard collar  in place.     Lungs- Intubated Extremities- Warm and well perfused.    Neurological Examination: On propofol at a rate of 35 and fentanyl at a rate of 75 Mental Status: Sedated on propofol and fentanyl. Slight movement of extremities to noxious stimulation.  Cranial Nerves: II:  PERRL 1.5 mm >> pinpoint. No blink to threat.  III,IV, VI: Eyes are conjugate and near the midline with no nystagmus or forced gaze deviation.   V: Weak blink to eyelid stimulation bilaterally.   VII: Face is flaccidly symmetric (sedated and intubated) VIII: No response to voice IX,X: Intubated XI: Unable to assess due to hard collar XII: Unable to assess Motor/Sensory: BUE with weak movement to sternal rub. No definite asymmetry.  BLE with slight movement to noxious plantar stimulation. No definite asymmetry.  Deep Tendon Reflexes: 1+ patellar and brachioradialis reflexes. 0 bilateral achilles.  Cerebellar/Gait: Unable to assess  Other: No jerking, twitching, posturing or other seizure-like activity noted.    Lab Results: Basic Metabolic Panel: Recent Labs  Lab 12/21/22 1820 12/21/22 1838 12/21/22 1942  NA 137 137 136  K 4.1 4.2 3.9  CL 99 103  --   CO2 16*  --   --   GLUCOSE 112* 106*  --   BUN 12 16  --   CREATININE 1.31* 1.10  --   CALCIUM 9.2  --   --     CBC: Recent Labs  Lab 12/21/22 1820 12/21/22 1838 12/21/22 1942  WBC 6.7  --   --   HGB 11.8* 13.6 11.2*  HCT 37.5* 40.0 33.0*  MCV 96.2  --   --   PLT 144*  --   --     Cardiac  Enzymes: No results for input(s): "CKTOTAL", "CKMB", "CKMBINDEX", "TROPONINI" in the last 168 hours.  Lipid Panel: No results for input(s): "CHOL", "TRIG", "HDL", "CHOLHDL", "VLDL", "LDLCALC" in the last 168 hours.  Imaging: CT HEAD WO CONTRAST  Result Date: 12/21/2022 CLINICAL DATA:  Blunt poly trauma, level 1 trauma, found down, altered mental status. Head trauma, moderate-severe; Polytrauma, blunt EXAM: CT HEAD WITHOUT CONTRAST CT CERVICAL SPINE WITHOUT CONTRAST CT CHEST, ABDOMEN AND PELVIS WITHOUT CONTRAST TECHNIQUE: Contiguous axial images were obtained from the base of the skull through the vertex without intravenous contrast. Multidetector CT imaging of the cervical spine was performed without intravenous contrast. Multiplanar CT image reconstructions were also generated. Multidetector CT imaging of the chest, abdomen and pelvis was performed following the standard protocol without IV contrast. RADIATION DOSE REDUCTION: This exam was performed according to the departmental dose-optimization program which includes automated exposure control, adjustment of the mA and/or kV according to patient size and/or use of iterative reconstruction technique. COMPARISON:  None Available. FINDINGS: CT HEAD FINDINGS Brain: Cortical encephalomalacia involving the left temporo occipital cortex in keeping with remote left posterior MCA division cortical infarct. Mild parenchymal volume loss is commensurate with the patient's age. No acute intracranial hemorrhage or infarct. No abnormal mass effect or midline shift. No abnormal intra or extra-axial mass lesion or fluid collection. Mild periventricular white matter changes are present likely reflecting the sequela of small vessel ischemia. Ventricular size is normal. Cerebellum is unremarkable. Vascular: No hyperdense vessel or unexpected calcification. Skull: Normal. Negative for fracture or focal lesion. Sinuses/Orbits: Remote right medial orbital wall fracture with  shallow depression. Mild bilateral preseptal soft tissue swelling. Ocular globes are intact in ocular lenses are ortho topically position. The retro-orbital fat is clear bilaterally and the extraocular musculature and optic  nerves are unremarkable. Mild layering fluid within the right maxillary sinus and opacification of several right ethmoid air cells. Remaining paranasal sinuses are clear. Other: Mastoid air cells and middle ear cavities are clear. CT CERVICAL FINDINGS Alignment: Normal. Skull base and vertebrae: No acute fracture. No primary bone lesion or focal pathologic process. Soft tissues and spinal canal: No prevertebral fluid or swelling. No visible canal hematoma. Disc levels: Disc space narrowing and endplate remodeling at C5-6 and to a lesser extent C6-7 in keeping with changes of moderate to severe degenerative disc disease. No high-grade canal stenosis. Moderate severe bilateral neuroforaminal narrowing at C5-6, left greater than right. Other: None CT CHEST FINDINGS Cardiovascular: Cardiac size is within normal limits. No significant coronary artery calcification. No pericardial effusion. Central pulmonary arteries are of normal caliber. There is mild aneurysm the proximal descending thoracic aorta measuring 3.3 cm in greatest dimension. Ascending aorta and distal descending aorta are of normal caliber. No intramural hematoma or dissection. Mediastinum/Nodes: Endotracheal tube 1.5 cm above the carina. Nasogastric tube loops within the stomach with its tip within the gastric fundus. Visualized thyroid is unremarkable. No pathologic thoracic adenopathy. Esophagus is unremarkable. No pneumomediastinum or mediastinal hematoma. Lungs/Pleura: Mild consolidation with the in the dependent, subpleural right lung may reflect changes related to acute infection or contusion. Lungs are otherwise clear. No pneumothorax or pleural effusion. Musculoskeletal: No acute bone abnormality. No lytic or blastic bone  lesion. CT ABDOMEN AND PELVIS FINDINGS Hepatobiliary: Moderate hepatic steatosis. No enhancing intrahepatic mass. No intra or extrahepatic biliary ductal dilation. Gallbladder unremarkable. Pancreas: Unremarkable Spleen: Unremarkable Adrenals/Urinary Tract: The adrenal glands are unremarkable. The kidneys are normal in size and position. 3 mm nonobstructing calculus noted within the upper pole the left kidney. The kidneys are otherwise unremarkable. Bladder is partially obscured by streak artifact but the visualized portion is unremarkable. Stomach/Bowel: Stomach is within normal limits. Appendix appears normal. No evidence of bowel wall thickening, distention, or inflammatory changes. Vascular/Lymphatic: Aortic atherosclerosis. No enlarged abdominal or pelvic lymph nodes. Reproductive: Obscured by streak artifact. Other: No abdominal wall hernia Musculoskeletal: Status post right hip arthroplasty. Osseous structures are diffusely osteopenic. No acute bone abnormality. No lytic or blastic bone lesion. IMPRESSION: 1. No acute intracranial injury. No calvarial fracture. 2. Remote left posterior MCA division cortical infarct. 3. No acute fracture or listhesis of the cervical spine. 4. Mild bilateral preseptal soft tissue swelling. Ocular globes are intact. 5. Mild consolidation within the dependent, subpleural right lung may reflect changes related to acute infection or contusion. 6. 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 Recommend annual imaging followup by CTA or MRA. This recommendation follows 2010 ACCF/AHA/AATS/ACR/ASA/SCA/SCAI/SIR/STS/SVM Guidelines for the Diagnosis and Management of Patients with Thoracic Aortic Disease. Circulation.2010; 121: Z610-R604. Aortic aneurysm NOS (ICD10-I71.9) 7. No acute intra-abdominal injury. 8. Moderate hepatic steatosis. 9. 3 mm nonobstructing left renal calculus. Aortic Atherosclerosis (ICD10-I70.0). These results were called by telephone at the  time of interpretation on 12/21/2022 at 7:02 pm to provider Metzinger, MD, who verbally acknowledged these results. Electronically Signed   By: Helyn Numbers M.D.   On: 12/21/2022 19:14   CT CERVICAL SPINE WO CONTRAST  Result Date: 12/21/2022 CLINICAL DATA:  Blunt poly trauma, level 1 trauma, found down, altered mental status. Head trauma, moderate-severe; Polytrauma, blunt EXAM: CT HEAD WITHOUT CONTRAST CT CERVICAL SPINE WITHOUT CONTRAST CT CHEST, ABDOMEN AND PELVIS WITHOUT CONTRAST TECHNIQUE: Contiguous axial images were obtained from the base of the skull through the vertex without intravenous contrast. Multidetector  CT imaging of the cervical spine was performed without intravenous contrast. Multiplanar CT image reconstructions were also generated. Multidetector CT imaging of the chest, abdomen and pelvis was performed following the standard protocol without IV contrast. RADIATION DOSE REDUCTION: This exam was performed according to the departmental dose-optimization program which includes automated exposure control, adjustment of the mA and/or kV according to patient size and/or use of iterative reconstruction technique. COMPARISON:  None Available. FINDINGS: CT HEAD FINDINGS Brain: Cortical encephalomalacia involving the left temporo occipital cortex in keeping with remote left posterior MCA division cortical infarct. Mild parenchymal volume loss is commensurate with the patient's age. No acute intracranial hemorrhage or infarct. No abnormal mass effect or midline shift. No abnormal intra or extra-axial mass lesion or fluid collection. Mild periventricular white matter changes are present likely reflecting the sequela of small vessel ischemia. Ventricular size is normal. Cerebellum is unremarkable. Vascular: No hyperdense vessel or unexpected calcification. Skull: Normal. Negative for fracture or focal lesion. Sinuses/Orbits: Remote right medial orbital wall fracture with shallow depression. Mild  bilateral preseptal soft tissue swelling. Ocular globes are intact in ocular lenses are ortho topically position. The retro-orbital fat is clear bilaterally and the extraocular musculature and optic nerves are unremarkable. Mild layering fluid within the right maxillary sinus and opacification of several right ethmoid air cells. Remaining paranasal sinuses are clear. Other: Mastoid air cells and middle ear cavities are clear. CT CERVICAL FINDINGS Alignment: Normal. Skull base and vertebrae: No acute fracture. No primary bone lesion or focal pathologic process. Soft tissues and spinal canal: No prevertebral fluid or swelling. No visible canal hematoma. Disc levels: Disc space narrowing and endplate remodeling at C5-6 and to a lesser extent C6-7 in keeping with changes of moderate to severe degenerative disc disease. No high-grade canal stenosis. Moderate severe bilateral neuroforaminal narrowing at C5-6, left greater than right. Other: None CT CHEST FINDINGS Cardiovascular: Cardiac size is within normal limits. No significant coronary artery calcification. No pericardial effusion. Central pulmonary arteries are of normal caliber. There is mild aneurysm the proximal descending thoracic aorta measuring 3.3 cm in greatest dimension. Ascending aorta and distal descending aorta are of normal caliber. No intramural hematoma or dissection. Mediastinum/Nodes: Endotracheal tube 1.5 cm above the carina. Nasogastric tube loops within the stomach with its tip within the gastric fundus. Visualized thyroid is unremarkable. No pathologic thoracic adenopathy. Esophagus is unremarkable. No pneumomediastinum or mediastinal hematoma. Lungs/Pleura: Mild consolidation with the in the dependent, subpleural right lung may reflect changes related to acute infection or contusion. Lungs are otherwise clear. No pneumothorax or pleural effusion. Musculoskeletal: No acute bone abnormality. No lytic or blastic bone lesion. CT ABDOMEN AND PELVIS  FINDINGS Hepatobiliary: Moderate hepatic steatosis. No enhancing intrahepatic mass. No intra or extrahepatic biliary ductal dilation. Gallbladder unremarkable. Pancreas: Unremarkable Spleen: Unremarkable Adrenals/Urinary Tract: The adrenal glands are unremarkable. The kidneys are normal in size and position. 3 mm nonobstructing calculus noted within the upper pole the left kidney. The kidneys are otherwise unremarkable. Bladder is partially obscured by streak artifact but the visualized portion is unremarkable. Stomach/Bowel: Stomach is within normal limits. Appendix appears normal. No evidence of bowel wall thickening, distention, or inflammatory changes. Vascular/Lymphatic: Aortic atherosclerosis. No enlarged abdominal or pelvic lymph nodes. Reproductive: Obscured by streak artifact. Other: No abdominal wall hernia Musculoskeletal: Status post right hip arthroplasty. Osseous structures are diffusely osteopenic. No acute bone abnormality. No lytic or blastic bone lesion. IMPRESSION: 1. No acute intracranial injury. No calvarial fracture. 2. Remote left posterior MCA division cortical infarct.  3. No acute fracture or listhesis of the cervical spine. 4. Mild bilateral preseptal soft tissue swelling. Ocular globes are intact. 5. Mild consolidation within the dependent, subpleural right lung may reflect changes related to acute infection or contusion. 6. 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 Recommend annual imaging followup by CTA or MRA. This recommendation follows 2010 ACCF/AHA/AATS/ACR/ASA/SCA/SCAI/SIR/STS/SVM Guidelines for the Diagnosis and Management of Patients with Thoracic Aortic Disease. Circulation.2010; 121: [id number]. Aortic aneurysm NOS (ICD10-I71.9) 7. No acute intra-abdominal injury. 8. Moderate hepatic steatosis. 9. 3 mm nonobstructing left renal calculus. Aortic Atherosclerosis (ICD10-I70.0). These results were called by telephone at the time of interpretation on  12/21/2022 at 7:02 pm to provider Metzinger, MD, who verbally acknowledged these results. Electronically Signed   By: Helyn Numbers M.D.   On: 12/21/2022 19:14   CT CHEST ABDOMEN PELVIS W CONTRAST  Result Date: 12/21/2022 CLINICAL DATA:  Blunt poly trauma, level 1 trauma, found down, altered mental status. Head trauma, moderate-severe; Polytrauma, blunt EXAM: CT HEAD WITHOUT CONTRAST CT CERVICAL SPINE WITHOUT CONTRAST CT CHEST, ABDOMEN AND PELVIS WITHOUT CONTRAST TECHNIQUE: Contiguous axial images were obtained from the base of the skull through the vertex without intravenous contrast. Multidetector CT imaging of the cervical spine was performed without intravenous contrast. Multiplanar CT image reconstructions were also generated. Multidetector CT imaging of the chest, abdomen and pelvis was performed following the standard protocol without IV contrast. RADIATION DOSE REDUCTION: This exam was performed according to the departmental dose-optimization program which includes automated exposure control, adjustment of the mA and/or kV according to patient size and/or use of iterative reconstruction technique. COMPARISON:  None Available. FINDINGS: CT HEAD FINDINGS Brain: Cortical encephalomalacia involving the left temporo occipital cortex in keeping with remote left posterior MCA division cortical infarct. Mild parenchymal volume loss is commensurate with the patient's age. No acute intracranial hemorrhage or infarct. No abnormal mass effect or midline shift. No abnormal intra or extra-axial mass lesion or fluid collection. Mild periventricular white matter changes are present likely reflecting the sequela of small vessel ischemia. Ventricular size is normal. Cerebellum is unremarkable. Vascular: No hyperdense vessel or unexpected calcification. Skull: Normal. Negative for fracture or focal lesion. Sinuses/Orbits: Remote right medial orbital wall fracture with shallow depression. Mild bilateral preseptal soft  tissue swelling. Ocular globes are intact in ocular lenses are ortho topically position. The retro-orbital fat is clear bilaterally and the extraocular musculature and optic nerves are unremarkable. Mild layering fluid within the right maxillary sinus and opacification of several right ethmoid air cells. Remaining paranasal sinuses are clear. Other: Mastoid air cells and middle ear cavities are clear. CT CERVICAL FINDINGS Alignment: Normal. Skull base and vertebrae: No acute fracture. No primary bone lesion or focal pathologic process. Soft tissues and spinal canal: No prevertebral fluid or swelling. No visible canal hematoma. Disc levels: Disc space narrowing and endplate remodeling at C5-6 and to a lesser extent C6-7 in keeping with changes of moderate to severe degenerative disc disease. No high-grade canal stenosis. Moderate severe bilateral neuroforaminal narrowing at C5-6, left greater than right. Other: None CT CHEST FINDINGS Cardiovascular: Cardiac size is within normal limits. No significant coronary artery calcification. No pericardial effusion. Central pulmonary arteries are of normal caliber. There is mild aneurysm the proximal descending thoracic aorta measuring 3.3 cm in greatest dimension. Ascending aorta and distal descending aorta are of normal caliber. No intramural hematoma or dissection. Mediastinum/Nodes: Endotracheal tube 1.5 cm above the carina. Nasogastric tube loops within the stomach with its tip within  the gastric fundus. Visualized thyroid is unremarkable. No pathologic thoracic adenopathy. Esophagus is unremarkable. No pneumomediastinum or mediastinal hematoma. Lungs/Pleura: Mild consolidation with the in the dependent, subpleural right lung may reflect changes related to acute infection or contusion. Lungs are otherwise clear. No pneumothorax or pleural effusion. Musculoskeletal: No acute bone abnormality. No lytic or blastic bone lesion. CT ABDOMEN AND PELVIS FINDINGS Hepatobiliary:  Moderate hepatic steatosis. No enhancing intrahepatic mass. No intra or extrahepatic biliary ductal dilation. Gallbladder unremarkable. Pancreas: Unremarkable Spleen: Unremarkable Adrenals/Urinary Tract: The adrenal glands are unremarkable. The kidneys are normal in size and position. 3 mm nonobstructing calculus noted within the upper pole the left kidney. The kidneys are otherwise unremarkable. Bladder is partially obscured by streak artifact but the visualized portion is unremarkable. Stomach/Bowel: Stomach is within normal limits. Appendix appears normal. No evidence of bowel wall thickening, distention, or inflammatory changes. Vascular/Lymphatic: Aortic atherosclerosis. No enlarged abdominal or pelvic lymph nodes. Reproductive: Obscured by streak artifact. Other: No abdominal wall hernia Musculoskeletal: Status post right hip arthroplasty. Osseous structures are diffusely osteopenic. No acute bone abnormality. No lytic or blastic bone lesion. IMPRESSION: 1. No acute intracranial injury. No calvarial fracture. 2. Remote left posterior MCA division cortical infarct. 3. No acute fracture or listhesis of the cervical spine. 4. Mild bilateral preseptal soft tissue swelling. Ocular globes are intact. 5. Mild consolidation within the dependent, subpleural right lung may reflect changes related to acute infection or contusion. 6. 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 Recommend annual imaging followup by CTA or MRA. This recommendation follows 2010 ACCF/AHA/AATS/ACR/ASA/SCA/SCAI/SIR/STS/SVM Guidelines for the Diagnosis and Management of Patients with Thoracic Aortic Disease. Circulation.2010; 121: X528-U132. Aortic aneurysm NOS (ICD10-I71.9) 7. No acute intra-abdominal injury. 8. Moderate hepatic steatosis. 9. 3 mm nonobstructing left renal calculus. Aortic Atherosclerosis (ICD10-I70.0). These results were called by telephone at the time of interpretation on 12/21/2022 at 7:02 pm to  provider Metzinger, MD, who verbally acknowledged these results. Electronically Signed   By: Helyn Numbers M.D.   On: 12/21/2022 19:14     Assessment: 72 year old male with breakthrough seizure at home. He has a history of heavy daily EtOH use consisting of 6-12 beers per day and will also consume an entire bottle of hard liquor in one day if this form of EtOH is available to him, per wife.  - Exam reveals a sedated elderly male on propofol and fentanyl. Slight movement of extremities to noxious stimulation. Slight furrowing of brow to noxious is consistent with cortically-mediated activity. No definite asymmetry on limited exam. No jerking, twitching, posturing or other seizure-like activity noted.  - LTM EEG at the bedside reveals beta activity on a low-voltage background. No electrographic seizures seen.  - Imaging: - CT head:  No acute intracranial injury. No calvarial fracture. Remote left posterior MCA division cortical infarct.  - CT C-spine: No acute fracture or listhesis of the cervical spine.  - CT chest: Mild consolidation within the dependent, subpleural right lung may reflect changes related to acute infection or contusion. 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  - Labs: - Initial lactate 12.5, now trending downwards.  - Na, K, Ca normal.  - AST and ALT normal.  - BUN 12 and Cr 1.31 with eGFR of 36 on initial labs. Cr improved to 1.1 on repeat lab draw.  - WBC 6.7 - Glucose 112 on initial labs.  - EtOH 11 on presentation. With this level, a chronic drinker could certainly have a  withdrawal seizure.  - Overall impression: Breakthrough seizure. No recurrent seizure-like activity noted on exam. DDx for underlying etiology includes possible EtOH withdrawal versus medication noncompliance or a combination of the two.   Recommendations: - Load with Keppra 2000 mg IV x 1 now. No scheduled dosing for now as EtOH withdrawal is a consideration.  - Continue his  home dosing regimen of Keppra 1000 mg BID. Will need to be given IV for now.  - CIWA protocol; - Continue propofol for now and attempt to wean in the AM.  - May need Versed gtt at a low infusion rate if tapering off propofol, to be determined by CIWA.  - Thiamine 100 mg IV or PO every day.  - Inpatient and outpatient seizure precautions.  - Continue LTM EEG for now.   40 minutes spent in the neurological evaluation and management of this critically ill patient.   Electronically signed: Dr. Caryl Pina 12/21/2022, 8:07 PM

## 2022-12-21 NOTE — ED Notes (Signed)
This RN to transport pt to 4N20 with RT for vent management, cardiac monitoring remains on pt, Neomia Dear, RN currently giving report to receiving RN

## 2022-12-21 NOTE — TOC CAGE-AID Note (Signed)
Transition of Care [facility]) - CAGE-AID Screening   Patient Details  Name: John Spears MRN: [id number] Date of Birth: 02/23/1873  Transition of Care [facility]) CM/SW Contact:    Katha Hamming, RN Phone Number: 12/21/2022, 7:37 PM   Clinical Narrative:  Currently unresponsive and intubated, alcohol level 11 on arrival. Would benefit from a repeat screen when alert.  CAGE-AID Screening: Substance Abuse Screening unable to be completed due to: : Patient unable to participate (unresponsive, intubated)

## 2022-12-21 NOTE — Progress Notes (Signed)
Date and time results received: 12/21/22 2148   Test: lactic Critical Value: 4  Name of Provider Notified: E-link

## 2022-12-21 NOTE — ED Notes (Signed)
Propofol started at 40 mg/hr per Dr. Adela Lank

## 2022-12-21 NOTE — ED Notes (Signed)
Fentanyl started at 50 mcg per Dr. Lanetta Inch approval.

## 2022-12-21 NOTE — Progress Notes (Signed)
Patient transported to CT and back to trauma room B on ventilator without complications.

## 2022-12-21 NOTE — ED Triage Notes (Signed)
Pt had fall with syncope/maybe seizure per family approx. 1630.  PT arrived GCS of 6, pale and diaphoretic.

## 2022-12-21 NOTE — Progress Notes (Signed)
ATRIUM MONITORING, HU charge captured

## 2022-12-21 NOTE — ED Notes (Signed)
$   67 cash counted withy Mandi-RN placed in wallet, placed in red lab bag and placed in belongings bag with clothes.

## 2022-12-21 NOTE — H&P (Addendum)
NAME:  John Spears, MRN:  [id number], DOB:  02/23/1873, LOS: 0 ADMISSION DATE:  12/21/2022, CHIEF COMPLAINT: Encephalopathy, seizures, ground-level fall  History of Present Illness:  As yet unidentified man, in his 30s, brought to the ED on 10/28 after falling and having witnessed seizure activity.  This was apparently 3 hours prior to transfer.  He reportedly was able to stand up and then fell again and seized again.  He was found by EMS unresponsive, hypertensive SBP 240s.  He had a seizure and route to the ED with EMS. No outward signs of trauma on his initial evaluation, trauma scans initiated as below and reassuring.  Question right basilar atelectasis versus infiltrate. Lactic acid 12.5, CO2 21 Mild anemia, mild thrombocytopenia EtOH 11 (mild elevation)  Addendum: Patient's ID found in his belongings, name John Spears, [birth year].  Still need to try to get his old chart information, PMH, family contacts, etc.   Pertinent  Medical History  Unknown at this time  Significant Hospital Events: Including procedures, antibiotic start and stop dates in addition to other pertinent events   Head and cervical spine CT 10/28 > no acute intracranial injury, no calvarial fracture.  Remote left posterior MCA infarct.  No acute fracture or listhesis of the cervical spine.  Mild bilateral preseptal soft tissue swelling ocular globes are intact CT chest/abdomen/pelvis.  3.3 cm descending TAA.  Mild dependent subpleural right lower lobe consolidation, atelectasis versus contusion versus infection.  Interim History / Subjective:    Objective   Blood pressure (!) 146/85, pulse 85, temperature (!) 96.2 F (35.7 C), temperature source Axillary, resp. rate 16, height 5\' 11"  (1.803 m), SpO2 100%.    Vent Mode: PRVC FiO2 (%):  [100 %] 100 % Set Rate:  [16 bmp] 16 bmp Vt Set:  [600 mL] 600 mL PEEP:  [5 cmH20] 5 cmH20 Plateau Pressure:  [22 cmH20] 22 cmH20   Intake/Output Summary (Last 24  hours) at 12/21/2022 1924 Last data filed at 12/21/2022 1910 Gross per 24 hour  Intake 20.18 ml  Output --  Net 20.18 ml   There were no vitals filed for this visit.  Examination: General: Well-developed man, intubated and sedated HENT: C-collar in place.  Pupils are 2 to 3 mm, sluggish, symmetrical.  Oropharynx clear with ET tube in place and some oral secretions. Lungs: Clear bilaterally Cardiovascular: Distant, regular without a murmur Abdomen: Nondistended with positive bowel sounds Extremities: No edema Neuro: Patient is sedated, does arouse to stimulation, localizes.  He has had some purposeful movement per RN prior to my arrival that required fentanyl.  Cough and gag intact.  Toes downgoing  Resolved Hospital Problem list     Assessment & Plan:   Seizures, unknown at this time whether patient has a history of same.  His alcohol level was mildly elevated, consider risk for alcohol withdrawal seizures Acute encephalopathy -EEG now -Ativan available as needed seizures while on propofol -Will hold off on AED, will initiate if any evidence of seizure activity on current level of sedation or on the EEG -Will ask neurology to consult and assist -Will need to obtain history from family when possible regarding chronicity of seizures, any prodrome.  He is afebrile, low suspicion for meningitis at least at this point -Thiamine and folate until we determine his alcohol history  Acute respiratory failure due to acute encephalopathy -PRVC 8 cc/kg -Sedation as per PAD protocol -VAP prevention orders -Pulmonary hygiene -Assess for possible WUA and SBT  Possible right  lower lobe pneumonia, consider aspiration pneumonia -Cover empirically with Unasyn and follow chest x-ray -Obtain respiratory culture, blood cultures -Narrow antibiotics depending on clinical course, cultures  Severe hypertension at presentation, SBP reported 240s -Currently SBP 140s on sedation, do not want to drop  it much further at least at this time -PRNs hydralazine available -need to confirm home med list  Severe lactic acidosis, question due to seizure activity -Follow lactate for clearance with volume resuscitation -Check CPK, may be at risk for rhabdomyolysis  Witnessed ground-level fall -Appreciate trauma surgery evaluation, his trauma series was reassuring -Will work on clearing his C-spine when he can participate with exam, c-collar for now    [employer] (right click and "Reselect all SmartList Selections" daily)   Diet/type: NPO DVT prophylaxis: prophylactic heparin  GI prophylaxis: PPI Lines: N/A Foley:  Yes, and it is still needed Code Status:  full code Last date of multidisciplinary goals of care discussion [pending]  Labs   CBC: Recent Labs  Lab 12/21/22 1820 12/21/22 1838  WBC 6.7  --   HGB 11.8* 13.6  HCT 37.5* 40.0  MCV 96.2  --   PLT 144*  --     Basic Metabolic Panel: Recent Labs  Lab 12/21/22 1838  NA 137  K 4.2  CL 103  GLUCOSE 106*  BUN 16  CREATININE 1.10   GFR: CrCl cannot be calculated (Unknown ideal weight.). Recent Labs  Lab 12/21/22 1820 12/21/22 1838  WBC 6.7  --   LATICACIDVEN  --  12.5*    Liver Function Tests: No results for input(s): "AST", "ALT", "ALKPHOS", "BILITOT", "PROT", "ALBUMIN" in the last 168 hours. No results for input(s): "LIPASE", "AMYLASE" in the last 168 hours. No results for input(s): "AMMONIA" in the last 168 hours.  ABG    Component Value Date/Time   TCO2 21 (L) 12/21/2022 1838     Coagulation Profile: Recent Labs  Lab 12/21/22 1820  INR 1.1    Cardiac Enzymes: No results for input(s): "CKTOTAL", "CKMB", "CKMBINDEX", "TROPONINI" in the last 168 hours.  HbA1C: No results found for: "HGBA1C"  CBG: Recent Labs  Lab 12/21/22 1819  GLUCAP 115*    Review of Systems:   Unable to obtain  Past Medical History:  He,  has no past medical history on file.   Surgical History:   Unknown Social History:     Unknown Family History:  His family history is not on file.   Allergies Not on File   Home Medications  Prior to Admission medications   Not on File     Critical care time: 60 minutes    Levy Pupa, MD, PhD 12/21/2022, 7:55 PM [facility] [phone] or if no answer before 7:00PM call [phone] For any issues after 7:00PM please call eLink [phone]

## 2022-12-21 NOTE — Progress Notes (Signed)
   12/21/22 1815  Spiritual Encounters  Type of Visit Initial  Care provided to: Pt not available  Referral source Trauma page  Reason for visit Trauma  OnCall Visit No   Chaplain responded to a level one trauma. The patient was attended to by the medical team. No family is present. If a chaplain is requested someone will respond.   Valerie Roys [facility]  [phone]

## 2022-12-21 NOTE — Consult Note (Signed)
John Spears 02/23/1873  [id number].    Requesting MD: Adela Lank Chief Complaint/Reason for Consult: Trauma  HPI:  I responded to a level 1 trauma alert. At the time of my arrival the patient was intubated and had recently received paralytic.  Per report, the patient is a male in his 65s who was at home when he fell. He was on the ground for more than 3 hours before EMS was notified.  There was reportedly seizure like activity in route to the hospital.  He arrived with a GCS of 6 and was intubated by the end.  ATLS protocol was followed.  There were no outward signs of trauma identified.  He initially had a disconjugate gaze with eyes deviated to the left, but at the time of my exam this has resolved.  He remained HDS throughout the exam. Hb 12.  WBC 7. Lactate 12.5  ROS: Not obtained due to patient factors.   No family history on file.  No past medical history on file.  Social History:  has no history on file for tobacco use, alcohol use, and drug use.  Allergies: Not on File  (Not in a hospital admission)   Physical Exam: Blood pressure (!) 146/85, pulse 85, resp. rate 16, height 5\' 11"  (1.803 m), SpO2 100%. Gen: elderly male, intubated and non responsive HEENT: ETT in place, no signs of trauma Resp: equal chest rise CV: RRR on the monitor Abd: soft, non-distended Back: superficial abrasion to the right flank, no stepoffs or deformity Neuro: GCS3T  Results for orders placed or performed during the hospital encounter of 12/21/22 (from the past 48 hour(s))  CBG monitoring, ED     Status: Abnormal   Collection Time: 12/21/22  6:19 PM  Result Value Ref Range   Glucose-Capillary 115 (H) 70 - 99 mg/dL    Comment: Glucose reference range applies only to samples taken after fasting for at least 8 hours.  CBC     Status: Abnormal   Collection Time: 12/21/22  6:20 PM  Result Value Ref Range   WBC 6.7 4.0 - 10.5 K/uL   RBC 3.90 (L) 4.22 - 5.81 MIL/uL   Hemoglobin 11.8 (L)  13.0 - 17.0 g/dL   HCT 40.9 (L) 81.1 - 91.4 %   MCV 96.2 80.0 - 100.0 fL   MCH 30.3 26.0 - 34.0 pg   MCHC 31.5 30.0 - 36.0 g/dL   RDW 78.2 95.6 - 21.3 %   Platelets 144 (L) 150 - 400 K/uL   nRBC 0.6 (H) 0.0 - 0.2 %    Comment: Performed at [facility], [address], [place], [place] [address]  Protime-INR     Status: None   Collection Time: 12/21/22  6:20 PM  Result Value Ref Range   Prothrombin Time 14.0 11.4 - 15.2 seconds   INR 1.1 0.8 - 1.2    Comment: (NOTE) INR goal varies based on device and disease states. Performed at [facility], [address] [place], [place] [address]   Sample to Blood Bank     Status: None   Collection Time: 12/21/22  6:20 PM  Result Value Ref Range   Blood Bank Specimen SAMPLE AVAILABLE FOR TESTING    Sample Expiration      12/24/2022,2359 Performed at [facility], [address] [place], [place] [address]   I-Stat Chem 8, ED     Status: Abnormal   Collection Time: 12/21/22  6:38 PM  Result Value Ref Range   Sodium 137 135 - 145 mmol/L   Potassium 4.2 3.5 - 5.1 mmol/L   Chloride 103 98 - 111 mmol/L   BUN 16 8 - 23 mg/dL   Creatinine, Ser 9.56 0.61 - 1.24 mg/dL   Glucose, Bld 213 (H) 70 - 99 mg/dL    Comment: Glucose reference range applies only to samples taken after fasting for at least 8 hours.   Calcium, Ion 1.13 (L) 1.15 - 1.40 mmol/L   TCO2 21 (L) 22 - 32 mmol/L   Hemoglobin 13.6 13.0 - 17.0 g/dL   HCT 08.6 57.8 - 46.9 %  I-Stat Lactic Acid, ED     Status: Abnormal   Collection Time: 12/21/22  6:38 PM  Result Value Ref Range   Lactic Acid, Venous 12.5 (HH) 0.5 - 1.9 mmol/L   Comment NOTIFIED PHYSICIAN    No results found.  Assessment/Plan Male in his 65s found down at home who arrived to the trauma bay with AMS and underwent intubation   - The etiology of the patient's fall and AMS is unclear at this time, but there are no outward signs of trauma and radiology's initial read did not identify any  injuries - Will defer to CCM for admission and management  - No indication for additional imaging from a trauma perspective.   Tacy Learn [facility] 12/21/2022, 7:14 PM Please see Amion for pager number during day hours 7:00am-4:30pm or 7:00am -11:30am on weekends

## 2022-12-21 NOTE — Progress Notes (Signed)
Transported pt from ed to 4N via vent with no inicident

## 2022-12-21 NOTE — ED Provider Notes (Signed)
[place] EMERGENCY DEPARTMENT AT [facility] Provider Note   CSN: [id number] Arrival date & time: 12/21/22  1814     History  No chief complaint on file.   John Spears is a 72 y.o. male who presents as a level 1 trauma after falling and having a witnessed seizure 3 hours ago. He then improved, stood up, and fell again and seized again. EMS found him in LL decubitus, GCS 6, and hypertensive to 240s systolic. Does not take blood thinners. Had a seizure in route with EMS and given Versed.      Home Medications Prior to Admission medications   Not on File      Allergies    Patient has no allergy information on record.    Review of Systems   Review of Systems unable to obtain  Physical Exam Updated Vital Signs BP (!) 146/85   Pulse 85   Temp (!) 96.2 F (35.7 C) (Axillary)   Resp 16   Ht 5\' 11"  (1.803 m)   Wt 75 kg   SpO2 100%   BMI 23.06 kg/m  Physical Exam Constitutional:      Comments: GCS 6. Winces to pain in traps  HENT:     Head: Normocephalic and atraumatic.     Nose: Nose normal.  Eyes:     Comments: Pinpoint pupils. Initially L gaze preference, then R gaze preference later during trauma survey. Gaze later became midline.  Neck:     Comments: C collar placed, no stepoff Cardiovascular:     Rate and Rhythm: Normal rate and regular rhythm.     Pulses: Normal pulses.     Heart sounds: Normal heart sounds.  Pulmonary:     Effort: Pulmonary effort is normal.     Breath sounds: Normal breath sounds.  Abdominal:     General: There is no distension.     Palpations: Abdomen is soft.     Tenderness: There is no guarding or rebound.  Musculoskeletal:     Cervical back: Neck supple.     Comments: Abrasions to R flank. No T or L stepoff or deformity  Skin:    General: Skin is warm.     Capillary Refill: Capillary refill takes less than 2 seconds.  Neurological:     Comments: Winces to pain with trap squeeze. LUE muscles flexed with trap pinch,  but arm did not move antigravity. No other extremity movements visualized.     ED Results / Procedures / Treatments   Labs (all labs ordered are listed, but only abnormal results are displayed) Labs Reviewed  COMPREHENSIVE METABOLIC PANEL - Abnormal; Notable for the following components:      Result Value   CO2 16 (*)    Glucose, Bld 112 (*)    Creatinine, Ser 1.31 (*)    Total Protein 6.2 (*)    GFR, Estimated 36 (*)    Anion gap 22 (*)    All other components within normal limits  CBC - Abnormal; Notable for the following components:   RBC 3.90 (*)    Hemoglobin 11.8 (*)    HCT 37.5 (*)    Platelets 144 (*)    nRBC 0.6 (*)    All other components within normal limits  ETHANOL - Abnormal; Notable for the following components:   Alcohol, Ethyl (B) 11 (*)    All other components within normal limits  I-STAT CHEM 8, ED - Abnormal; Notable for the following components:   Glucose,  Bld 106 (*)    Calcium, Ion 1.13 (*)    TCO2 21 (*)    All other components within normal limits  I-STAT CG4 LACTIC ACID, ED - Abnormal; Notable for the following components:   Lactic Acid, Venous 12.5 (*)    All other components within normal limits  CBG MONITORING, ED - Abnormal; Notable for the following components:   Glucose-Capillary 115 (*)    All other components within normal limits  I-STAT ARTERIAL BLOOD GAS, ED - Abnormal; Notable for the following components:   pO2, Arterial 429 (*)    Acid-base deficit 4.0 (*)    Calcium, Ion 1.14 (*)    HCT 33.0 (*)    Hemoglobin 11.2 (*)    All other components within normal limits  CULTURE, RESPIRATORY W GRAM STAIN  PROTIME-INR  URINALYSIS, ROUTINE W REFLEX MICROSCOPIC  TRIGLYCERIDES  CK  CBC  BASIC METABOLIC PANEL  BLOOD GAS, ARTERIAL  MAGNESIUM  PHOSPHORUS  LACTIC ACID, PLASMA  LACTIC ACID, PLASMA  BLOOD GAS, ARTERIAL  SAMPLE TO BLOOD BANK    EKG EKG Interpretation Date/Time:  Monday December 21 2022 18:17:48 EDT Ventricular Rate:   85 PR Interval:  255 QRS Duration:  140 QT Interval:  418 QTC Calculation: 498 R Axis:   -66  Text Interpretation: Sinus rhythm Prolonged PR interval Probable left atrial enlargement RBBB and LAFB No old tracing to compare Confirmed by Melene Plan [phone]) on 12/21/2022 6:53:13 PM  Radiology CT HEAD WO CONTRAST  Result Date: 12/21/2022 CLINICAL DATA:  Blunt poly trauma, level 1 trauma, found down, altered mental status. Head trauma, moderate-severe; Polytrauma, blunt EXAM: CT HEAD WITHOUT CONTRAST CT CERVICAL SPINE WITHOUT CONTRAST CT CHEST, ABDOMEN AND PELVIS WITHOUT CONTRAST TECHNIQUE: Contiguous axial images were obtained from the base of the skull through the vertex without intravenous contrast. Multidetector CT imaging of the cervical spine was performed without intravenous contrast. Multiplanar CT image reconstructions were also generated. Multidetector CT imaging of the chest, abdomen and pelvis was performed following the standard protocol without IV contrast. RADIATION DOSE REDUCTION: This exam was performed according to the departmental dose-optimization program which includes automated exposure control, adjustment of the mA and/or kV according to patient size and/or use of iterative reconstruction technique. COMPARISON:  None Available. FINDINGS: CT HEAD FINDINGS Brain: Cortical encephalomalacia involving the left temporo occipital cortex in keeping with remote left posterior MCA division cortical infarct. Mild parenchymal volume loss is commensurate with the patient's age. No acute intracranial hemorrhage or infarct. No abnormal mass effect or midline shift. No abnormal intra or extra-axial mass lesion or fluid collection. Mild periventricular white matter changes are present likely reflecting the sequela of small vessel ischemia. Ventricular size is normal. Cerebellum is unremarkable. Vascular: No hyperdense vessel or unexpected calcification. Skull: Normal. Negative for fracture or focal  lesion. Sinuses/Orbits: Remote right medial orbital wall fracture with shallow depression. Mild bilateral preseptal soft tissue swelling. Ocular globes are intact in ocular lenses are ortho topically position. The retro-orbital fat is clear bilaterally and the extraocular musculature and optic nerves are unremarkable. Mild layering fluid within the right maxillary sinus and opacification of several right ethmoid air cells. Remaining paranasal sinuses are clear. Other: Mastoid air cells and middle ear cavities are clear. CT CERVICAL FINDINGS Alignment: Normal. Skull base and vertebrae: No acute fracture. No primary bone lesion or focal pathologic process. Soft tissues and spinal canal: No prevertebral fluid or swelling. No visible canal hematoma. Disc levels: Disc space narrowing and endplate remodeling at C5-6  and to a lesser extent C6-7 in keeping with changes of moderate to severe degenerative disc disease. No high-grade canal stenosis. Moderate severe bilateral neuroforaminal narrowing at C5-6, left greater than right. Other: None CT CHEST FINDINGS Cardiovascular: Cardiac size is within normal limits. No significant coronary artery calcification. No pericardial effusion. Central pulmonary arteries are of normal caliber. There is mild aneurysm the proximal descending thoracic aorta measuring 3.3 cm in greatest dimension. Ascending aorta and distal descending aorta are of normal caliber. No intramural hematoma or dissection. Mediastinum/Nodes: Endotracheal tube 1.5 cm above the carina. Nasogastric tube loops within the stomach with its tip within the gastric fundus. Visualized thyroid is unremarkable. No pathologic thoracic adenopathy. Esophagus is unremarkable. No pneumomediastinum or mediastinal hematoma. Lungs/Pleura: Mild consolidation with the in the dependent, subpleural right lung may reflect changes related to acute infection or contusion. Lungs are otherwise clear. No pneumothorax or pleural effusion.  Musculoskeletal: No acute bone abnormality. No lytic or blastic bone lesion. CT ABDOMEN AND PELVIS FINDINGS Hepatobiliary: Moderate hepatic steatosis. No enhancing intrahepatic mass. No intra or extrahepatic biliary ductal dilation. Gallbladder unremarkable. Pancreas: Unremarkable Spleen: Unremarkable Adrenals/Urinary Tract: The adrenal glands are unremarkable. The kidneys are normal in size and position. 3 mm nonobstructing calculus noted within the upper pole the left kidney. The kidneys are otherwise unremarkable. Bladder is partially obscured by streak artifact but the visualized portion is unremarkable. Stomach/Bowel: Stomach is within normal limits. Appendix appears normal. No evidence of bowel wall thickening, distention, or inflammatory changes. Vascular/Lymphatic: Aortic atherosclerosis. No enlarged abdominal or pelvic lymph nodes. Reproductive: Obscured by streak artifact. Other: No abdominal wall hernia Musculoskeletal: Status post right hip arthroplasty. Osseous structures are diffusely osteopenic. No acute bone abnormality. No lytic or blastic bone lesion. IMPRESSION: 1. No acute intracranial injury. No calvarial fracture. 2. Remote left posterior MCA division cortical infarct. 3. No acute fracture or listhesis of the cervical spine. 4. Mild bilateral preseptal soft tissue swelling. Ocular globes are intact. 5. Mild consolidation within the dependent, subpleural right lung may reflect changes related to acute infection or contusion. 6. 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 Recommend annual imaging followup by CTA or MRA. This recommendation follows 2010 ACCF/AHA/AATS/ACR/ASA/SCA/SCAI/SIR/STS/SVM Guidelines for the Diagnosis and Management of Patients with Thoracic Aortic Disease. Circulation.2010; 121: B147-W295. Aortic aneurysm NOS (ICD10-I71.9) 7. No acute intra-abdominal injury. 8. Moderate hepatic steatosis. 9. 3 mm nonobstructing left renal calculus. Aortic  Atherosclerosis (ICD10-I70.0). These results were called by telephone at the time of interpretation on 12/21/2022 at 7:02 pm to provider Metzinger, MD, who verbally acknowledged these results. Electronically Signed   By: Helyn Numbers M.D.   On: 12/21/2022 19:14   CT CERVICAL SPINE WO CONTRAST  Result Date: 12/21/2022 CLINICAL DATA:  Blunt poly trauma, level 1 trauma, found down, altered mental status. Head trauma, moderate-severe; Polytrauma, blunt EXAM: CT HEAD WITHOUT CONTRAST CT CERVICAL SPINE WITHOUT CONTRAST CT CHEST, ABDOMEN AND PELVIS WITHOUT CONTRAST TECHNIQUE: Contiguous axial images were obtained from the base of the skull through the vertex without intravenous contrast. Multidetector CT imaging of the cervical spine was performed without intravenous contrast. Multiplanar CT image reconstructions were also generated. Multidetector CT imaging of the chest, abdomen and pelvis was performed following the standard protocol without IV contrast. RADIATION DOSE REDUCTION: This exam was performed according to the departmental dose-optimization program which includes automated exposure control, adjustment of the mA and/or kV according to patient size and/or use of iterative reconstruction technique. COMPARISON:  None Available. FINDINGS: CT HEAD FINDINGS  Brain: Cortical encephalomalacia involving the left temporo occipital cortex in keeping with remote left posterior MCA division cortical infarct. Mild parenchymal volume loss is commensurate with the patient's age. No acute intracranial hemorrhage or infarct. No abnormal mass effect or midline shift. No abnormal intra or extra-axial mass lesion or fluid collection. Mild periventricular white matter changes are present likely reflecting the sequela of small vessel ischemia. Ventricular size is normal. Cerebellum is unremarkable. Vascular: No hyperdense vessel or unexpected calcification. Skull: Normal. Negative for fracture or focal lesion. Sinuses/Orbits:  Remote right medial orbital wall fracture with shallow depression. Mild bilateral preseptal soft tissue swelling. Ocular globes are intact in ocular lenses are ortho topically position. The retro-orbital fat is clear bilaterally and the extraocular musculature and optic nerves are unremarkable. Mild layering fluid within the right maxillary sinus and opacification of several right ethmoid air cells. Remaining paranasal sinuses are clear. Other: Mastoid air cells and middle ear cavities are clear. CT CERVICAL FINDINGS Alignment: Normal. Skull base and vertebrae: No acute fracture. No primary bone lesion or focal pathologic process. Soft tissues and spinal canal: No prevertebral fluid or swelling. No visible canal hematoma. Disc levels: Disc space narrowing and endplate remodeling at C5-6 and to a lesser extent C6-7 in keeping with changes of moderate to severe degenerative disc disease. No high-grade canal stenosis. Moderate severe bilateral neuroforaminal narrowing at C5-6, left greater than right. Other: None CT CHEST FINDINGS Cardiovascular: Cardiac size is within normal limits. No significant coronary artery calcification. No pericardial effusion. Central pulmonary arteries are of normal caliber. There is mild aneurysm the proximal descending thoracic aorta measuring 3.3 cm in greatest dimension. Ascending aorta and distal descending aorta are of normal caliber. No intramural hematoma or dissection. Mediastinum/Nodes: Endotracheal tube 1.5 cm above the carina. Nasogastric tube loops within the stomach with its tip within the gastric fundus. Visualized thyroid is unremarkable. No pathologic thoracic adenopathy. Esophagus is unremarkable. No pneumomediastinum or mediastinal hematoma. Lungs/Pleura: Mild consolidation with the in the dependent, subpleural right lung may reflect changes related to acute infection or contusion. Lungs are otherwise clear. No pneumothorax or pleural effusion. Musculoskeletal: No acute  bone abnormality. No lytic or blastic bone lesion. CT ABDOMEN AND PELVIS FINDINGS Hepatobiliary: Moderate hepatic steatosis. No enhancing intrahepatic mass. No intra or extrahepatic biliary ductal dilation. Gallbladder unremarkable. Pancreas: Unremarkable Spleen: Unremarkable Adrenals/Urinary Tract: The adrenal glands are unremarkable. The kidneys are normal in size and position. 3 mm nonobstructing calculus noted within the upper pole the left kidney. The kidneys are otherwise unremarkable. Bladder is partially obscured by streak artifact but the visualized portion is unremarkable. Stomach/Bowel: Stomach is within normal limits. Appendix appears normal. No evidence of bowel wall thickening, distention, or inflammatory changes. Vascular/Lymphatic: Aortic atherosclerosis. No enlarged abdominal or pelvic lymph nodes. Reproductive: Obscured by streak artifact. Other: No abdominal wall hernia Musculoskeletal: Status post right hip arthroplasty. Osseous structures are diffusely osteopenic. No acute bone abnormality. No lytic or blastic bone lesion. IMPRESSION: 1. No acute intracranial injury. No calvarial fracture. 2. Remote left posterior MCA division cortical infarct. 3. No acute fracture or listhesis of the cervical spine. 4. Mild bilateral preseptal soft tissue swelling. Ocular globes are intact. 5. Mild consolidation within the dependent, subpleural right lung may reflect changes related to acute infection or contusion. 6. 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 Recommend annual imaging followup by CTA or MRA. This recommendation follows 2010 ACCF/AHA/AATS/ACR/ASA/SCA/SCAI/SIR/STS/SVM Guidelines for the Diagnosis and Management of Patients with Thoracic Aortic Disease. Circulation.2010; 121: W098-J191.  Aortic aneurysm NOS (ICD10-I71.9) 7. No acute intra-abdominal injury. 8. Moderate hepatic steatosis. 9. 3 mm nonobstructing left renal calculus. Aortic Atherosclerosis (ICD10-I70.0). These  results were called by telephone at the time of interpretation on 12/21/2022 at 7:02 pm to provider Metzinger, MD, who verbally acknowledged these results. Electronically Signed   By: Helyn Numbers M.D.   On: 12/21/2022 19:14   CT CHEST ABDOMEN PELVIS W CONTRAST  Result Date: 12/21/2022 CLINICAL DATA:  Blunt poly trauma, level 1 trauma, found down, altered mental status. Head trauma, moderate-severe; Polytrauma, blunt EXAM: CT HEAD WITHOUT CONTRAST CT CERVICAL SPINE WITHOUT CONTRAST CT CHEST, ABDOMEN AND PELVIS WITHOUT CONTRAST TECHNIQUE: Contiguous axial images were obtained from the base of the skull through the vertex without intravenous contrast. Multidetector CT imaging of the cervical spine was performed without intravenous contrast. Multiplanar CT image reconstructions were also generated. Multidetector CT imaging of the chest, abdomen and pelvis was performed following the standard protocol without IV contrast. RADIATION DOSE REDUCTION: This exam was performed according to the departmental dose-optimization program which includes automated exposure control, adjustment of the mA and/or kV according to patient size and/or use of iterative reconstruction technique. COMPARISON:  None Available. FINDINGS: CT HEAD FINDINGS Brain: Cortical encephalomalacia involving the left temporo occipital cortex in keeping with remote left posterior MCA division cortical infarct. Mild parenchymal volume loss is commensurate with the patient's age. No acute intracranial hemorrhage or infarct. No abnormal mass effect or midline shift. No abnormal intra or extra-axial mass lesion or fluid collection. Mild periventricular white matter changes are present likely reflecting the sequela of small vessel ischemia. Ventricular size is normal. Cerebellum is unremarkable. Vascular: No hyperdense vessel or unexpected calcification. Skull: Normal. Negative for fracture or focal lesion. Sinuses/Orbits: Remote right medial orbital wall  fracture with shallow depression. Mild bilateral preseptal soft tissue swelling. Ocular globes are intact in ocular lenses are ortho topically position. The retro-orbital fat is clear bilaterally and the extraocular musculature and optic nerves are unremarkable. Mild layering fluid within the right maxillary sinus and opacification of several right ethmoid air cells. Remaining paranasal sinuses are clear. Other: Mastoid air cells and middle ear cavities are clear. CT CERVICAL FINDINGS Alignment: Normal. Skull base and vertebrae: No acute fracture. No primary bone lesion or focal pathologic process. Soft tissues and spinal canal: No prevertebral fluid or swelling. No visible canal hematoma. Disc levels: Disc space narrowing and endplate remodeling at C5-6 and to a lesser extent C6-7 in keeping with changes of moderate to severe degenerative disc disease. No high-grade canal stenosis. Moderate severe bilateral neuroforaminal narrowing at C5-6, left greater than right. Other: None CT CHEST FINDINGS Cardiovascular: Cardiac size is within normal limits. No significant coronary artery calcification. No pericardial effusion. Central pulmonary arteries are of normal caliber. There is mild aneurysm the proximal descending thoracic aorta measuring 3.3 cm in greatest dimension. Ascending aorta and distal descending aorta are of normal caliber. No intramural hematoma or dissection. Mediastinum/Nodes: Endotracheal tube 1.5 cm above the carina. Nasogastric tube loops within the stomach with its tip within the gastric fundus. Visualized thyroid is unremarkable. No pathologic thoracic adenopathy. Esophagus is unremarkable. No pneumomediastinum or mediastinal hematoma. Lungs/Pleura: Mild consolidation with the in the dependent, subpleural right lung may reflect changes related to acute infection or contusion. Lungs are otherwise clear. No pneumothorax or pleural effusion. Musculoskeletal: No acute bone abnormality. No lytic or  blastic bone lesion. CT ABDOMEN AND PELVIS FINDINGS Hepatobiliary: Moderate hepatic steatosis. No enhancing intrahepatic mass. No intra or extrahepatic biliary  ductal dilation. Gallbladder unremarkable. Pancreas: Unremarkable Spleen: Unremarkable Adrenals/Urinary Tract: The adrenal glands are unremarkable. The kidneys are normal in size and position. 3 mm nonobstructing calculus noted within the upper pole the left kidney. The kidneys are otherwise unremarkable. Bladder is partially obscured by streak artifact but the visualized portion is unremarkable. Stomach/Bowel: Stomach is within normal limits. Appendix appears normal. No evidence of bowel wall thickening, distention, or inflammatory changes. Vascular/Lymphatic: Aortic atherosclerosis. No enlarged abdominal or pelvic lymph nodes. Reproductive: Obscured by streak artifact. Other: No abdominal wall hernia Musculoskeletal: Status post right hip arthroplasty. Osseous structures are diffusely osteopenic. No acute bone abnormality. No lytic or blastic bone lesion. IMPRESSION: 1. No acute intracranial injury. No calvarial fracture. 2. Remote left posterior MCA division cortical infarct. 3. No acute fracture or listhesis of the cervical spine. 4. Mild bilateral preseptal soft tissue swelling. Ocular globes are intact. 5. Mild consolidation within the dependent, subpleural right lung may reflect changes related to acute infection or contusion. 6. 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 Recommend annual imaging followup by CTA or MRA. This recommendation follows 2010 ACCF/AHA/AATS/ACR/ASA/SCA/SCAI/SIR/STS/SVM Guidelines for the Diagnosis and Management of Patients with Thoracic Aortic Disease. Circulation.2010; 121: W098-J191. Aortic aneurysm NOS (ICD10-I71.9) 7. No acute intra-abdominal injury. 8. Moderate hepatic steatosis. 9. 3 mm nonobstructing left renal calculus. Aortic Atherosclerosis (ICD10-I70.0). These results were called by  telephone at the time of interpretation on 12/21/2022 at 7:02 pm to provider Metzinger, MD, who verbally acknowledged these results. Electronically Signed   By: Helyn Numbers M.D.   On: 12/21/2022 19:14    Procedures Procedure Name: Intubation Date/Time: 12/21/2022 6:39 PM  Performed by: Karmen Stabs, MDPre-anesthesia Checklist: Emergency Drugs available, Suction available, Patient being monitored, Patient identified and Timeout performed Oxygen Delivery Method: Non-rebreather mask Preoxygenation: Pre-oxygenation with 100% oxygen Induction Type: Rapid sequence Ventilation: Mask ventilation without difficulty Laryngoscope Size: Mac and 4 Grade View: Grade I Tube size: 7.5 mm Number of attempts: 1 Airway Equipment and Method: Video-laryngoscopy Placement Confirmation: ETT inserted through vocal cords under direct vision, Positive ETCO2, CO2 detector and Breath sounds checked- equal and bilateral Secured at: 24 cm Tube secured with: ETT holder Dental Injury: Teeth and Oropharynx as per pre-operative assessment         Medications Ordered in ED Medications  etomidate (AMIDATE) injection (20 mg Intravenous Given 12/21/22 1821)  succinylcholine (ANECTINE) injection (140 mg Intravenous Given 12/21/22 1821)  fentaNYL in NS (51mcg/ml) infusion-PREMIX (50 mcg/hr Intravenous Infusion Verify 12/21/22 1938)  fentaNYL (SUBLIMAZE) bolus via infusion 25-100 mcg (has no administration in time range)  propofol (DIPRIVAN) 1000 MG/100ML infusion (40 mcg/kg/min  75 kg (Order-Specific) Intravenous Infusion Verify 12/21/22 1907)  heparin injection 5,000 Units (has no administration in time range)  pantoprazole (PROTONIX) injection 40 mg (has no administration in time range)  lactated ringers infusion ( Intravenous New Bag/Given 12/21/22 1943)  docusate sodium (COLACE) capsule 100 mg (has no administration in time range)  polyethylene glycol (MIRALAX / GLYCOLAX) packet 17 g (has no  administration in time range)  docusate (COLACE) 50 MG/5ML liquid 100 mg (has no administration in time range)  polyethylene glycol (MIRALAX / GLYCOLAX) packet 17 g (has no administration in time range)  iohexol (OMNIPAQUE) 350 MG/ML injection 75 mL (75 mLs Intravenous Contrast Given 12/21/22 1853)  lactated ringers bolus 1,000 mL (1,000 mLs Intravenous New Bag/Given 12/21/22 1940)    ED Course/ Medical Decision Making/ A&P Clinical Course as of 12/21/22 1946  Mon Dec 21, 2022  1919 Hemoglobin 11.8, no leukocytosis, normal electrolytes, normal renal function, normal glucose, lactate of 12.5, normal coags [GD]  1920 Trauma scans are negative for traumatic abnormality [GD]  1933 EKG NSR rate of 85, leftward axis, RBBB + LAFB, 1st degree HB, no STEMI or overt ischemic changes [GD]    Clinical Course User Index [GD] Karmen Stabs, MD                                 Medical Decision Making Amount and/or Complexity of Data Reviewed Independent Historian: EMS Labs: ordered. Decision-making details documented in ED Course. Radiology: ordered and independent interpretation performed. Decision-making details documented in ED Course.  Risk Prescription drug management. Decision regarding hospitalization.   Unknown adult male who presents as a level 1 trauma for poor GCS after falls and seizures.  On arrival he is hemodynamically stable but with GCS of 6.  He is spontaneously breathing though nonrebreather at 15 L is in place to keep him adequately oxygenated.  On arrival patient was intubated for airway protection with etomidate and succinylcholine followed by propofol sedation.  ETT placement confirmed with chest x-ray. C collar placed. Breath sounds symmetric.  Normal pulses x 4.  Trauma surgeon present and evaluated patient at bedside.  Trauma secondary survey notable for abrasions to the right flank.  Patient was sent for trauma pan scans and trauma labs. Labs are notable for Hb 11.8, normal  WBC, normal electrolytes, normal renal function, normal glucose, lactate of 12.5 (suspect from seizure), normal coags. Trauma scans negative for traumatic abnormality. EKG with NSR rate of 85, leftward axis, RBBB + LAFB, 1st degree HB, no STEMI or overt ischemic changes. Given no traumatic injuries trauma surgery not to admit. Patient then admitted to critical care medicine in stable condition for further care.           Final Clinical Impression(s) / ED Diagnoses Final diagnoses:  Altered mental status, unspecified altered mental status type    Rx / DC Orders ED Discharge Orders     None         Karmen Stabs, MD 12/21/22 2105    Melene Plan, DO 12/21/22 2321

## 2022-12-21 NOTE — ED Notes (Addendum)
Trauma Response Nurse Documentation   John Spears is a 72 y.o. male arriving to [facility] ED via EMS  On No antithrombotic. Trauma was activated as a Level 1 by ED charge RN based on the following trauma criteria GCS < 9.  Patient cleared for CT by Dr. Hillery Hunter Trauma MD. Pt transported to CT with trauma response nurse present to monitor. RN remained with the patient throughout their absence from the department for clinical observation.   GCS 6.  History   No past medical history on file.        Initial Focused Assessment (If applicable, or please see trauma documentation): Unresponsive male presents via EMS following a seizure resulting in fall, down for 3 hours prior to EMS arrival. Reportedly had seizure activity with EMS and arrived with dysconjugate gaze.  Unknown hx.  Airway clear but unsecured d/t AMS, intubated on arrival BS clear No obvious uncontrolled hemorrhage GCS PERRLA 2   CT's Completed:   CT Head, CT C-Spine, CT Chest w/ contrast, and CT abdomen/pelvis w/ contrast   Interventions:  IV start and trauma lab draw CT as above Chest and pelvis XRAY EKG RSI with etomidate and succinylcholine Temp foley OG [place] J  Plan for disposition:  Admission to ICU   Consults completed:  CCM Byrum paged at 1916, call returned at 1918. Trauma Metzger paged at [phone], arrived to bedside at 1823  Event Summary: Presents after a seizure per EMS, then fell. Reportedly his wife let him lay on the floor for 3 hours before calling EMS? Unknown hx. Unresponsive GCS 6 on arrival, intubated on arrival. No obvious trauma noted. [place] J collar applied. Scans without acute injury, CCM to admit.  MTP Summary (If applicable): NA  Bedside handoff with ED RN Josh.    Vlada Uriostegui O Belton Peplinski  Trauma Response RN  Please call TRN at [phone] for further assistance.

## 2022-12-21 NOTE — Progress Notes (Signed)
eLink Physician-Brief Progress Note Patient Name: John Spears DOB: 12/07/1950 MRN: [id number]   Date of Service  12/21/2022  HPI/Events of Note  Patient transferred from OSH with hypertensive crises, seizures, and altered mental status against a background of being a poorly compliant alcoholic on medications for hypertension, SBP on presentation was 240 mmHg, and his wife confirms that a month ago he had an episode with a similar BP of 230 mmHg, he was intubated for airway protection, work up is in progress, and neurology has been consulted. Blood alcohol concentration was 11.  eICU Interventions  New Patient Evaluation.        John Spears 12/21/2022, 8:56 PM

## 2022-12-21 NOTE — ED Notes (Signed)
CCM returned page, en route.

## 2022-12-21 NOTE — Progress Notes (Signed)
LTM EEG running - no initial skin breakdown - push button tested - neuro notified.  

## 2022-12-21 NOTE — Progress Notes (Signed)
PHARMACY ANTIBIOTIC CONSULT NOTE   John Spears a 72 y.o. male found down at home >3 hours.  Pharmacy has been consulted for Unasyn dosing for possible aspiration pneumonia.  Estimated Creatinine Clearance: 66.3 mL/min (by C-G formula based on SCr of 1.1 mg/dL).  Plan: START Unasyn 3 gm IV Q8h F/U CXR Monitor renal function, clinical status, C/S, de-escalation   Allergies:  Not on File  [facility] Weights   12/21/22 1927  Weight: 75 kg (165 lb 5.5 oz)       Latest Ref Rng & Units 12/21/2022    7:42 PM 12/21/2022    6:38 PM 12/21/2022    6:20 PM  CBC  WBC 4.0 - 10.5 K/uL   6.7   Hemoglobin 13.0 - 17.0 g/dL 73.2  20.2  54.2   Hematocrit 39.0 - 52.0 % 33.0  40.0  37.5   Platelets 150 - 400 K/uL   144     Antibiotics Given (last 72 hours)     None       Antimicrobials this admission: Unasyn 10/28 >> c  Microbiology results: 10/28 Bcx: sent 10/28 trach aspirate: sent   Thank you for allowing pharmacy to be a part of this patient's care.  Trixie Rude, PharmD Clinical Pharmacist 12/21/2022  8:12 PM

## 2022-12-21 NOTE — Progress Notes (Signed)
Unable to reach RN, attempting EEG

## 2022-12-21 NOTE — Progress Notes (Signed)
Orthopedic Tech Progress Note Patient Details:  John Spears 02/23/1873 [id number] Level 1 Trauma. Not needed at the moment Patient ID: John Spears, male   DOB: 02/23/1873, 72 y.o.   MRN: [id number]  [name] 12/21/2022, 6:33 PM

## 2022-12-22 ENCOUNTER — Inpatient Hospital Stay (HOSPITAL_COMMUNITY): Payer: 59

## 2022-12-22 DIAGNOSIS — J9601 Acute respiratory failure with hypoxia: Secondary | ICD-10-CM | POA: Diagnosis not present

## 2022-12-22 DIAGNOSIS — R569 Unspecified convulsions: Secondary | ICD-10-CM

## 2022-12-22 DIAGNOSIS — G40919 Epilepsy, unspecified, intractable, without status epilepticus: Secondary | ICD-10-CM

## 2022-12-22 DIAGNOSIS — F1010 Alcohol abuse, uncomplicated: Secondary | ICD-10-CM

## 2022-12-22 DIAGNOSIS — G9340 Encephalopathy, unspecified: Secondary | ICD-10-CM | POA: Diagnosis not present

## 2022-12-22 DIAGNOSIS — F1090 Alcohol use, unspecified, uncomplicated: Secondary | ICD-10-CM

## 2022-12-22 DIAGNOSIS — D696 Thrombocytopenia, unspecified: Secondary | ICD-10-CM

## 2022-12-22 LAB — VITAMIN B12: Vitamin B-12: 948 pg/mL — ABNORMAL HIGH (ref 180–914)

## 2022-12-22 LAB — BASIC METABOLIC PANEL
Anion gap: 16 — ABNORMAL HIGH (ref 5–15)
BUN: 10 mg/dL (ref 8–23)
CO2: 21 mmol/L — ABNORMAL LOW (ref 22–32)
Calcium: 8.9 mg/dL (ref 8.9–10.3)
Chloride: 103 mmol/L (ref 98–111)
Creatinine, Ser: 1.17 mg/dL (ref 0.61–1.24)
GFR, Estimated: >60 mL/min (ref >60)
Glucose, Bld: 75 mg/dL (ref 70–99)
Potassium: 3.4 mmol/L — ABNORMAL LOW (ref 3.5–5.1)
Sodium: 140 mmol/L (ref 135–145)

## 2022-12-22 LAB — POCT I-STAT 7, (LYTES, BLD GAS, ICA,H+H)
Acid-base deficit: 1 mmol/L (ref 0.0–2.0)
Bicarbonate: 21.6 mmol/L (ref 20.0–28.0)
Calcium, Ion: 1.14 mmol/L — ABNORMAL LOW (ref 1.15–1.40)
HCT: 31 % — ABNORMAL LOW (ref 39.0–52.0)
Hemoglobin: 10.5 g/dL — ABNORMAL LOW (ref 13.0–17.0)
O2 Saturation: 100 %
Patient temperature: 36.6
Potassium: 2.9 mmol/L — ABNORMAL LOW (ref 3.5–5.1)
Sodium: 138 mmol/L (ref 135–145)
TCO2: 22 mmol/L (ref 22–32)
pCO2 arterial: 27.5 mm[Hg] — ABNORMAL LOW (ref 32–48)
pH, Arterial: 7.502 — ABNORMAL HIGH (ref 7.35–7.45)
pO2, Arterial: 172 mm[Hg] — ABNORMAL HIGH (ref 83–108)

## 2022-12-22 LAB — MRSA NEXT GEN BY PCR, NASAL: MRSA by PCR Next Gen: NOT DETECTED

## 2022-12-22 LAB — PHOSPHORUS: Phosphorus: 2.1 mg/dL — ABNORMAL LOW (ref 2.5–4.6)

## 2022-12-22 LAB — GLUCOSE, CAPILLARY
Glucose-Capillary: 100 mg/dL — ABNORMAL HIGH (ref 70–99)
Glucose-Capillary: 64 mg/dL — ABNORMAL LOW (ref 70–99)
Glucose-Capillary: 81 mg/dL (ref 70–99)
Glucose-Capillary: 81 mg/dL (ref 70–99)
Glucose-Capillary: 82 mg/dL (ref 70–99)
Glucose-Capillary: 88 mg/dL (ref 70–99)
Glucose-Capillary: 91 mg/dL (ref 70–99)
Glucose-Capillary: 92 mg/dL (ref 70–99)

## 2022-12-22 LAB — CBC
HCT: 30.9 % — ABNORMAL LOW (ref 39.0–52.0)
Hemoglobin: 10.4 g/dL — ABNORMAL LOW (ref 13.0–17.0)
MCH: 30.1 pg (ref 26.0–34.0)
MCHC: 33.7 g/dL (ref 30.0–36.0)
MCV: 89.3 fL (ref 80.0–100.0)
Platelets: 114 10*3/uL — ABNORMAL LOW (ref 150–400)
RBC: 3.46 MIL/uL — ABNORMAL LOW (ref 4.22–5.81)
RDW: 14.3 % (ref 11.5–15.5)
WBC: 5.9 10*3/uL (ref 4.0–10.5)
nRBC: 0.3 % — ABNORMAL HIGH (ref 0.0–0.2)

## 2022-12-22 LAB — TRIGLYCERIDES: Triglycerides: 72 mg/dL (ref <150)

## 2022-12-22 LAB — LACTIC ACID, PLASMA: Lactic Acid, Venous: 3.3 mmol/L (ref 0.5–1.9)

## 2022-12-22 LAB — MAGNESIUM: Magnesium: 1.5 mg/dL — ABNORMAL LOW (ref 1.7–2.4)

## 2022-12-22 MED ORDER — POLYETHYLENE GLYCOL 3350 17 G PO PACK
17.0000 g | PACK | Freq: Every day | ORAL | Status: DC
Start: 2022-12-23 — End: 2022-12-23
  Filled 2022-12-22: qty 1

## 2022-12-22 MED ORDER — ADULT MULTIVITAMIN W/MINERALS CH
1.0000 | ORAL_TABLET | Freq: Every day | ORAL | Status: DC
  Administered 2022-12-22: 1
  Filled 2022-12-22: qty 1

## 2022-12-22 MED ORDER — SODIUM CHLORIDE 0.9 % IV SOLN
1.0000 mg | Freq: Every day | INTRAVENOUS | Status: DC
  Filled 2022-12-22: qty 0.2

## 2022-12-22 MED ORDER — FOLIC ACID 5 MG/ML IJ SOLN
1.0000 mg | Freq: Every day | INTRAMUSCULAR | Status: DC
  Filled 2022-12-22: qty 0.2

## 2022-12-22 MED ORDER — FOLIC ACID 1 MG PO TABS: 1.0000 mg | ORAL_TABLET | Freq: Every day | ORAL | Status: DC

## 2022-12-22 MED ORDER — K PHOS MONO-SOD PHOS DI & MONO 155-852-130 MG PO TABS
500.0000 mg | ORAL_TABLET | Freq: Once | ORAL | Status: AC
  Administered 2022-12-22: 500 mg
  Filled 2022-12-22: qty 2

## 2022-12-22 MED ORDER — FOLIC ACID 1 MG PO TABS
1.0000 mg | ORAL_TABLET | Freq: Every day | ORAL | Status: DC
  Administered 2022-12-22: 1 mg
  Filled 2022-12-22: qty 1

## 2022-12-22 MED ORDER — ADULT MULTIVITAMIN W/MINERALS CH
1.0000 | ORAL_TABLET | Freq: Every day | ORAL | Status: DC
  Filled 2022-12-22: qty 1

## 2022-12-22 MED ORDER — POTASSIUM CHLORIDE 20 MEQ PO PACK: 60.0000 meq | PACK | Freq: Once | ORAL | Status: DC

## 2022-12-22 MED ORDER — LEVETIRACETAM IN NACL 1000 MG/100ML IV SOLN
1000.0000 mg | Freq: Two times a day (BID) | INTRAVENOUS | Status: DC
  Administered 2022-12-22 – 2022-12-23 (×3): 1000 mg via INTRAVENOUS
  Filled 2022-12-22 (×3): qty 100

## 2022-12-22 MED ORDER — FOLIC ACID 1 MG PO TABS
1.0000 mg | ORAL_TABLET | Freq: Every day | ORAL | Status: AC
  Administered 2022-12-23: 1 mg via ORAL
  Filled 2022-12-22: qty 1

## 2022-12-22 MED ORDER — SODIUM CHLORIDE 0.9% FLUSH
3.0000 mL | Freq: Two times a day (BID) | INTRAVENOUS | Status: DC
  Administered 2022-12-22 – 2022-12-30 (×17): 3 mL via INTRAVENOUS

## 2022-12-22 MED ORDER — DEXTROSE 50 % IV SOLN: 25.0000 mL | Freq: Once | INTRAVENOUS | Status: AC

## 2022-12-22 MED ORDER — POTASSIUM CHLORIDE 20 MEQ PO PACK
40.0000 meq | PACK | Freq: Once | ORAL | Status: AC
  Administered 2022-12-22: 40 meq
  Filled 2022-12-22: qty 2

## 2022-12-22 MED ORDER — ORAL CARE MOUTH RINSE: 15.0000 mL | OROMUCOSAL | Status: DC | PRN

## 2022-12-22 MED ORDER — VITAMIN B-12 1000 MCG PO TABS: 1000.0000 ug | ORAL_TABLET | Freq: Every day | ORAL | Status: DC

## 2022-12-22 MED ORDER — DEXTROSE 50 % IV SOLN
INTRAVENOUS | Status: AC
  Administered 2022-12-22: 25 mL via INTRAVENOUS
  Filled 2022-12-22: qty 50

## 2022-12-22 MED ORDER — DOCUSATE SODIUM 100 MG PO CAPS
100.0000 mg | ORAL_CAPSULE | Freq: Two times a day (BID) | ORAL | Status: DC
  Filled 2022-12-22: qty 1

## 2022-12-22 MED ORDER — THIAMINE HCL 100 MG/ML IJ SOLN: 100.0000 mg | Freq: Every day | INTRAMUSCULAR | Status: DC

## 2022-12-22 MED ORDER — THIAMINE MONONITRATE 100 MG PO TABS
100.0000 mg | ORAL_TABLET | Freq: Every day | ORAL | Status: AC
  Administered 2022-12-23: 100 mg via ORAL
  Filled 2022-12-22: qty 1

## 2022-12-22 MED ORDER — POTASSIUM & SODIUM PHOSPHATES 280-160-250 MG PO PACK: 2.0000 | PACK | Freq: Once | ORAL | Status: DC

## 2022-12-22 MED ORDER — FOLIC ACID 5 MG/ML IJ SOLN: 1.0000 mg | Freq: Every day | INTRAMUSCULAR | Status: DC

## 2022-12-22 MED ORDER — CALCIUM GLUCONATE-NACL 2-0.675 GM/100ML-% IV SOLN
2.0000 g | Freq: Once | INTRAVENOUS | Status: AC
  Administered 2022-12-22: 2000 mg via INTRAVENOUS
  Filled 2022-12-22: qty 100

## 2022-12-22 MED ORDER — MAGNESIUM SULFATE 4 GM/100ML IV SOLN
4.0000 g | Freq: Once | INTRAVENOUS | Status: AC
  Administered 2022-12-22: 4 g via INTRAVENOUS
  Filled 2022-12-22: qty 100

## 2022-12-22 MED ORDER — PROSOURCE TF20 ENFIT COMPATIBL EN LIQD
60.0000 mL | Freq: Every day | ENTERAL | Status: DC
  Administered 2022-12-22: 60 mL
  Filled 2022-12-22: qty 60

## 2022-12-22 MED ORDER — THIAMINE MONONITRATE 100 MG PO TABS
100.0000 mg | ORAL_TABLET | Freq: Every day | ORAL | Status: DC
  Administered 2022-12-22: 100 mg
  Filled 2022-12-22: qty 1

## 2022-12-22 MED ORDER — OSMOLITE 1.5 CAL PO LIQD: 1000.0000 mL | ORAL | Status: DC

## 2022-12-22 MED ORDER — LORAZEPAM 1 MG PO TABS: 1.0000 mg | ORAL_TABLET | ORAL | Status: DC | PRN

## 2022-12-22 MED ORDER — LORAZEPAM 2 MG/ML IJ SOLN
1.0000 mg | INTRAMUSCULAR | Status: DC | PRN
  Administered 2022-12-22 – 2022-12-23 (×3): 2 mg via INTRAVENOUS
  Filled 2022-12-22 (×5): qty 1

## 2022-12-22 NOTE — Progress Notes (Signed)
NEUROLOGY CONSULT FOLLOW UP NOTE   Date of service: December 22, 2022 Patient Name: John Spears MRN:  [id number] DOB:  [birth year]  Brief HPI  John Spears is a 72 y.o. male with a history of seizures (on Keppra at home) and HTN (on Coreg and Lasix), prior left MCA stroke, who presented to the ED this evening as a level 1 trauma after a fall at home. Family noted GTC seizure activity at home prior to him falling. Family left him on the ground for about 3 hours to see if he would recover. He reportedly was able to stand up and then fell again and seized again. He was found by EMS unresponsive, hypertensive SBP 240s. He had another seizure with EMS en route. Of note, he has a history of heavy daily EtOH use consisting of 6-12 beers per day and will also consume an entire bottle of hard liquor in one day if this form of EtOH is available to him, per wife. He arrived with a GCS of 6. EtOH level was 11 in the ED. He has been intubated and is in a hard cervical collar.   Based on chart review semiology of events is typically right gaze deviation, right sided jerking, with secondary generalization at times   Interval Hx/subjective  - Limited by ETT but following commands, mouthing responses when asked questions  Vitals   Current vital signs: BP 122/60   Pulse 73   Temp 97.9 F (36.6 C)   Resp (!) 24   Ht 5\' 11"  (1.803 m)   Wt 82.5 kg   SpO2 100%   BMI 25.37 kg/m  Vital signs in last 24 hours: Temp:  [96.2 F (35.7 C)-97.9 F (36.6 C)] 97.9 F (36.6 C) (10/29 0700) Pulse Rate:  [45-85] 73 (10/29 0741) Resp:  [12-24] 24 (10/29 0741) BP: (120-168)/(60-88) 122/60 (10/29 0741) SpO2:  [97 %-100 %] 100 % (10/29 0700) FiO2 (%):  [40 %-100 %] 40 % (10/29 0741) Weight:  [75 kg-82.5 kg] 82.5 kg (10/29 0606)  Body mass index is 25.37 kg/m.  Physical Exam   Constitutional: Appears elderly, no acute distress Psych: Calm, interactive Eyes: No scleral injection.  HENT: No OP  obstrucion.  Head: EEG wires in place Cardiovascular: Normal rate and regular rhythm on monitor Respiratory: Slightly uncomfortable on ETT with sedation held for exam GI: Soft.  No distension. There is no tenderness.  Skin: WDI.   Neurologic Examination   Neuro: Mental Status: Patient is awake, alert, able to follow some simple commands but not all  Cranial Nerves: II: PERRL III,IV, VI: Left gaze preference, able to look to the right V: Facial sensation is symmetric to light eyelash brush VII: Facial movement is symmetric with ETT limits VIII: hearing is intact to voice Motor: Drift of the RUE > LUE, bilateral LE drift quickly to the bed, right again a little worse than left Sensory: Reactive to touch in all four    Labs and Diagnostic Imaging     Basic Metabolic Panel: Recent Labs  Lab 12/21/22 1820 12/21/22 1838 12/21/22 1942 12/22/22 0551 12/22/22 0616  NA 137 137 136 138 140  K 4.1 4.2 3.9 2.9* 3.4*  CL 99 103  --   --  103  CO2 16*  --   --   --  21*  GLUCOSE 112* 106*  --   --  75  BUN 12 16  --   --  10  CREATININE 1.31* 1.10  --   --  1.17  CALCIUM 9.2  --   --   --  8.9  MG  --   --   --   --  1.5*  PHOS  --   --   --   --  2.1*    CBC: Recent Labs  Lab 12/21/22 1820 12/21/22 1838 12/21/22 1942 12/22/22 0551 12/22/22 0616  WBC 6.7  --   --   --  5.9  HGB 11.8* 13.6 11.2* 10.5* 10.4*  HCT 37.5* 40.0 33.0* 31.0* 30.9*  MCV 96.2  --   --   --  89.3  PLT 144*  --   --   --  114*    Coagulation Studies: Recent Labs    12/21/22 1820  LABPROT 14.0  INR 1.1    No results found for: "VITAMINB12"   Urine Drug Screen: No results found for: "LABOPIA", "COCAINSCRNUR", "LABBENZ", "AMPHETMU", "THCU", "LABBARB"  Alcohol Level     Component Value Date/Time   ETH 11 (H) 12/21/2022 1820   INR  Lab Results  Component Value Date   INR 1.1 12/21/2022   CT Head without contrast(Personally reviewed): No acute intracranial injury. No calvarial  fracture. Remote left posterior MCA division cortical infarct.   CT C-spine: No acute fracture or listhesis of the cervical spine.   CT chest: Mild consolidation within the dependent, subpleural right lung may reflect changes related to acute infection or contusion. 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   MRI brain results from 02/21/2022: IMPRESSION:  1. No acute intracranial abnormality.  2. Old left parietal lobe infarct and findings of chronic small  vessel disease.   EEG report pending Preliminary report negative for seizures  Impression   John Spears is a 72 y.o. 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  Fortunately his clinical exam is improving; left gaze preference and right-sided weakness consistent with postictal Todd's phenomena  Recommendations  - Added on UDS for diagnostic clarity - Agree with weaning sedation and extubation as medically ready. - Appreciate CIWA protocol per critical care team, phenobarbital is often particularly helpful in patients with comorbid alcoholism and epilepsy - Agree with thiamine, folate, MVI - B12 level added on supplementation with 1000 mcg p.o. daily starting tomorrow may be discontinued if B12 level results greater than 400 - Continue Keppra 1000 mg BID, convert one-to-one to p.o. or per tube as able - May discontinue EEG if negative for subclinical seizures  ______________________________________________________________________   Thank you for the opportunity to take part in the care of this patient. If you have any further questions, please contact the neurology consultation team on call. Updated oncall schedule is listed on AMION.  Signed,  Brooke Dare MD-PhD [facility] [phone]  Available 7 AM to  7 PM, outside these hours please contact Neurologist on call listed on  AMION

## 2022-12-22 NOTE — Procedures (Addendum)
Patient Name: John Spears  MRN: [id number]  Epilepsy Attending: Charlsie Quest  Referring Physician/Provider: Caryl Pina, MD  Duration: 12/21/2022 2126 to 12/22/2022 1002  Patient history: 72 year old male with breakthrough seizure at home. EEG to evaluate for seizure  Level of alertness:  comatose-->awake  AEDs during EEG study: LEV, propofol  Technical aspects: This EEG study was done with scalp electrodes positioned according to the 10-20 International system of electrode placement. Electrical activity was reviewed with band pass filter of 1-70Hz , sensitivity of 7 uV/mm, display speed of 50mm/sec with a 60Hz  notched filter applied as appropriate. EEG data were recorded continuously and digitally stored.  Video monitoring was available and reviewed as appropriate.  Description: EEG initially showed continuous generalized and lateralized left hemisphere 3 to 6 Hz theta-delta slowing admixed with 15 to 18 Hz beta activity distributed symmetrically and diffusely.  On 12/22/2022 after around 8 AM as sedation was weaned, EEG showed posterior dominant rhythm of 8-9 Hz activity of moderate voltage (25-35 uV) seen predominantly in posterior head regions, asymmetric ( left<right) and reactive to eye opening and eye closing.  EEG continued to show continuous 3 to 6 Hz he developed slowing in left hemisphere.  Hyperventilation and photic stimulation were not performed.     ABNORMALITY - Continuous slow, generalized and lateralized left hemisphere  IMPRESSION: This study is suggestive of cortical dysfunction arising from left hemisphere likely secondary to underlying structural abnormality, post-ictal state. Additionally there was severe diffuse encephalopathy likely related to sedation which gradually improved as sedation was weaned. No seizures or epileptiform discharges were seen throughout the recording.  [name]

## 2022-12-22 NOTE — Procedures (Signed)
Extubation Procedure Note  Patient Details:   Name: John Spears DOB: 04-20-1950 MRN: [id number]   Airway Documentation:    Vent end date: 12/22/22 Vent end time: 1030   Evaluation  O2 sats: stable throughout Complications: No apparent complications Patient did tolerate procedure well. Bilateral Breath Sounds: Clear   Yes  Pt extubated per MD order. Pt suctioned orally/ETT, positive cuff leak prior. Upon extubation, Pt able to say name, give good cough effort, no stridor heard. Pt placed on 3L nasal cannula w/o complication. RT will continue to monitor.  Derinda Late 12/22/2022, 10:38 AM

## 2022-12-22 NOTE — Progress Notes (Signed)
vLTM discontinued  No skin breakdown at all skin sites  [facility] notified 

## 2022-12-22 NOTE — Progress Notes (Signed)
Brief Nutrition Note  Consult received for enteral/tube feeding initiation and management.  Adult Enteral Nutrition Protocol initiated. Full assessment to follow.  16 F OG tube in place with tip located in stomach per xray imaging.   Admitting Dx: Seizures (HCC) [R56.9] Altered mental status, unspecified altered mental status type [R41.82]  Body mass index is 25.37 kg/m.   Labs:  Recent Labs  Lab 12/21/22 1820 12/21/22 1838 12/21/22 1942 12/22/22 0551 12/22/22 0616  NA 137 137 136 138 140  K 4.1 4.2 3.9 2.9* 3.4*  CL 99 103  --   --  103  CO2 16*  --   --   --  21*  BUN 12 16  --   --  10  CREATININE 1.31* 1.10  --   --  1.17  CALCIUM 9.2  --   --   --  8.9  MG  --   --   --   --  1.5*  PHOS  --   --   --   --  2.1*  GLUCOSE 112* 106*  --   --  75    Omar Orrego P., RD, LDN, CNSC See AMiON for contact information

## 2022-12-22 NOTE — Progress Notes (Signed)
NAME:  John Spears, MRN:  [id number], DOB:  1950/04/29, LOS: 1 ADMISSION DATE:  12/21/2022, CHIEF COMPLAINT: Encephalopathy, seizures, ground-level fall  History of Present Illness:  As yet unidentified man, in his 1s, brought to the ED on 10/28 after falling and having witnessed seizure activity.  This was apparently 3 hours prior to transfer.  He reportedly was able to stand up and then fell again and seized again.  He was found by EMS unresponsive, hypertensive SBP 240s.  He had a seizure and route to the ED with EMS. No outward signs of trauma on his initial evaluation, trauma scans initiated as below and reassuring.  Question right basilar atelectasis versus infiltrate. Lactic acid 12.5, CO2 21 Mild anemia, mild thrombocytopenia EtOH 11 (mild elevation)  Addendum: Patient's ID found in his belongings, name John Spears, 06-22-1950.  Still need to try to get his old chart information, PMH, family contacts, etc.   Pertinent  Medical History  Unknown at this time  Significant Hospital Events: Including procedures, antibiotic start and stop dates in addition to other pertinent events   Head and cervical spine CT 10/28 > no acute intracranial injury, no calvarial fracture.  Remote left posterior MCA infarct.  No acute fracture or listhesis of the cervical spine.  Mild bilateral preseptal soft tissue swelling ocular globes are intact CT chest/abdomen/pelvis.  3.3 cm descending TAA.  Mild dependent subpleural right lower lobe consolidation, atelectasis versus contusion versus infection. 10/29- no sz, extubate   Interim History / Subjective:  NAEO  Labs w some lyte deficits  Objective   Blood pressure (!) 152/86, pulse 74, temperature 98.4 F (36.9 C), resp. rate 13, height 5\' 11"  (1.803 m), weight 82.5 kg, SpO2 100%.    Vent Mode: PRVC FiO2 (%):  [40 %-100 %] 40 % Set Rate:  [12 bmp-16 bmp] 12 bmp Vt Set:  [600 mL] 600 mL PEEP:  [5 cmH20] 5 cmH20 Plateau Pressure:  [18  cmH20-22 cmH20] 18 cmH20   Intake/Output Summary (Last 24 hours) at 12/22/2022 1026 Last data filed at 12/22/2022 0930 Gross per 24 hour  Intake 2457.86 ml  Output 600 ml  Net 1857.86 ml   Filed Weights   12/21/22 1927 12/22/22 0606  Weight: 75 kg 82.5 kg    Examination: General: Chronically ill appearing older adult M intubated and lightly sedated  HENT: Hard C collar in place. ETT secure. Anicteric sclera  Lungs: Upper lobe rhonchi bilaterally. Mechanically ventilated, symmetrical chest expansion  Cardiovascular: rrr cap refill is Abdomen:soft abd + bowel sounds x 4  Extremities: no acute joint deformity, no cyanosis or clubbing  Neuro: Awakens, drowsy. Following commands. R sided   Resolved Hospital Problem list     Assessment & Plan:    Acute encephalopathy Hx sz, with breakthrough seizures  EtOH abuse  -Rx keppra OP  -drinks 6-12 beers/d + 1 bottle of liquor if available.  -sounds like sister gives him his daily meds but there are instances when he throws his meds away. Isnt sure how compliant he is w his Keppra. Conversely, these could be r/t his etoh use  P -cEEG for now -- if without sz will extubate  -neuro following, cont home Keppra -wean prop  -micronutrient support for etoh abuse, as we wean prop will add CIWA monitoring   Acute resp failure  Aspiration PNA RLL  Resp alkalosis  P -WUA/SBT as appropriate -- hopefully extubate 10/29 if cEEG without sz  -VAP, pulm hygiene  -unasyn for short  course  -RR decr on Mv   HTN, with hypertensive urgency -reported Bps 240s, now in 140s with sedation P -wean sedation -holding home antihypertensives   Lactic acidosis, improving -likely 202 sz  P -PRN LA  AKI vs CKD  -thought to have CKD earlier in 2024, maybe CKD II, but has had some fluctuations in renal indices, now normal on 2nd lab cycle of current admission P -follow renal indices, UOP   Ground level fall 2/2 sz -clear C collar    Anemia Thrombocytopenia -have been prevalent since 08/2022. Likely 2/2 etoh use  P -follow CBC   Hypocalcemia Hypomagnesemia Hypokalemia  Hypophosphatemia  P -replace  -cont to trend    Best Practice (right click and "Reselect all SmartList Selections" daily)   Diet/type: NPO DVT prophylaxis: prophylactic heparin  GI prophylaxis: PPI Lines: N/A Foley:  Yes, and it is no longer needed and removal ordered  Code Status:  full code Last date of multidisciplinary goals of care discussion [talked to sister Kathie Rhodes 10/29]   Labs   CBC: Recent Labs  Lab 12/21/22 1820 12/21/22 1838 12/21/22 1942 12/22/22 0551 12/22/22 0616  WBC 6.7  --   --   --  5.9  HGB 11.8* 13.6 11.2* 10.5* 10.4*  HCT 37.5* 40.0 33.0* 31.0* 30.9*  MCV 96.2  --   --   --  89.3  PLT 144*  --   --   --  114*    Basic Metabolic Panel: Recent Labs  Lab 12/21/22 1820 12/21/22 1838 12/21/22 1942 12/22/22 0551 12/22/22 0616  NA 137 137 136 138 140  K 4.1 4.2 3.9 2.9* 3.4*  CL 99 103  --   --  103  CO2 16*  --   --   --  21*  GLUCOSE 112* 106*  --   --  75  BUN 12 16  --   --  10  CREATININE 1.31* 1.10  --   --  1.17  CALCIUM 9.2  --   --   --  8.9  MG  --   --   --   --  1.5*  PHOS  --   --   --   --  2.1*   GFR: Estimated Creatinine Clearance: 62.6 mL/min (by C-G formula based on SCr of 1.17 mg/dL). Recent Labs  Lab 12/21/22 1820 12/21/22 1838 12/21/22 2110 12/21/22 2331 12/22/22 0616  WBC 6.7  --   --   --  5.9  LATICACIDVEN  --  12.5* 4.0* 3.3*  --     Liver Function Tests: Recent Labs  Lab 12/21/22 1820  AST 35  ALT 8  ALKPHOS 57  BILITOT 0.3  PROT 6.2*  ALBUMIN 3.5   No results for input(s): "LIPASE", "AMYLASE" in the last 168 hours. No results for input(s): "AMMONIA" in the last 168 hours.  ABG    Component Value Date/Time   PHART 7.502 (H) 12/22/2022 0551   PCO2ART 27.5 (L) 12/22/2022 0551   PO2ART 172 (H) 12/22/2022 0551   HCO3 21.6 12/22/2022 0551   TCO2 22  12/22/2022 0551   ACIDBASEDEF 1.0 12/22/2022 0551   O2SAT 100 12/22/2022 0551     Coagulation Profile: Recent Labs  Lab 12/21/22 1820  INR 1.1    Cardiac Enzymes: Recent Labs  Lab 12/21/22 2110  CKTOTAL 77    HbA1C: No results found for: "HGBA1C"  CBG: Recent Labs  Lab 12/21/22 1819 12/22/22 0016 12/22/22 0324 12/22/22 0736 12/22/22 0814  GLUCAP  115* 91 81 64* 100*    CRITICAL CARE Performed by: Lanier Clam   Total critical care time: 41 minutes  Critical care time was exclusive of separately billable procedures and treating other patients. Critical care was necessary to treat or prevent imminent or life-threatening deterioration.  Critical care was time spent personally by me on the following activities: development of treatment plan with patient and/or surrogate as well as nursing, discussions with consultants, evaluation of patient's response to treatment, examination of patient, obtaining history from patient or surrogate, ordering and performing treatments and interventions, ordering and review of laboratory studies, ordering and review of radiographic studies, pulse oximetry and re-evaluation of patient's condition.  Tessie Fass MSN, AGACNP-BC [facility] Pulmonary/Critical Care Medicine Amion for pager  12/22/2022, 10:26 AM

## 2022-12-23 ENCOUNTER — Inpatient Hospital Stay (HOSPITAL_COMMUNITY): Payer: 59

## 2022-12-23 DIAGNOSIS — F1010 Alcohol abuse, uncomplicated: Secondary | ICD-10-CM

## 2022-12-23 DIAGNOSIS — G9340 Encephalopathy, unspecified: Secondary | ICD-10-CM | POA: Diagnosis not present

## 2022-12-23 DIAGNOSIS — G40919 Epilepsy, unspecified, intractable, without status epilepticus: Secondary | ICD-10-CM

## 2022-12-23 DIAGNOSIS — R569 Unspecified convulsions: Secondary | ICD-10-CM | POA: Diagnosis not present

## 2022-12-23 DIAGNOSIS — D696 Thrombocytopenia, unspecified: Secondary | ICD-10-CM

## 2022-12-23 LAB — GLUCOSE, CAPILLARY
Glucose-Capillary: 67 mg/dL — ABNORMAL LOW (ref 70–99)
Glucose-Capillary: 115 mg/dL — ABNORMAL HIGH (ref 70–99)
Glucose-Capillary: 77 mg/dL (ref 70–99)
Glucose-Capillary: 83 mg/dL (ref 70–99)
Glucose-Capillary: 86 mg/dL (ref 70–99)
Glucose-Capillary: 105 mg/dL — ABNORMAL HIGH (ref 70–99)

## 2022-12-23 LAB — CBC
HCT: 30.9 % — ABNORMAL LOW (ref 39.0–52.0)
Hemoglobin: 10.3 g/dL — ABNORMAL LOW (ref 13.0–17.0)
MCH: 30.6 pg (ref 26.0–34.0)
MCHC: 33.3 g/dL (ref 30.0–36.0)
MCV: 91.7 fL (ref 80.0–100.0)
Platelets: 101 10*3/uL — ABNORMAL LOW (ref 150–400)
RBC: 3.37 MIL/uL — ABNORMAL LOW (ref 4.22–5.81)
RDW: 14.8 % (ref 11.5–15.5)
WBC: 8.4 10*3/uL (ref 4.0–10.5)
nRBC: 0 % (ref 0.0–0.2)

## 2022-12-23 LAB — COMPREHENSIVE METABOLIC PANEL
ALT: 10 U/L (ref 0–44)
AST: 19 U/L (ref 15–41)
Albumin: 2.9 g/dL — ABNORMAL LOW (ref 3.5–5.0)
Alkaline Phosphatase: 49 U/L (ref 38–126)
Anion gap: 11 (ref 5–15)
BUN: 11 mg/dL (ref 8–23)
CO2: 24 mmol/L (ref 22–32)
Calcium: 9.1 mg/dL (ref 8.9–10.3)
Chloride: 106 mmol/L (ref 98–111)
Creatinine, Ser: 1.27 mg/dL — ABNORMAL HIGH (ref 0.61–1.24)
GFR, Estimated: >60 mL/min (ref >60)
Glucose, Bld: 81 mg/dL (ref 70–99)
Potassium: 3.3 mmol/L — ABNORMAL LOW (ref 3.5–5.1)
Sodium: 141 mmol/L (ref 135–145)
Total Bilirubin: 0.7 mg/dL (ref 0.3–1.2)
Total Protein: 5.6 g/dL — ABNORMAL LOW (ref 6.5–8.1)

## 2022-12-23 LAB — PHOSPHORUS: Phosphorus: 3.1 mg/dL (ref 2.5–4.6)

## 2022-12-23 LAB — MAGNESIUM: Magnesium: 1.8 mg/dL (ref 1.7–2.4)

## 2022-12-23 MED ORDER — PHENOBARBITAL 32.4 MG PO TABS
97.2000 mg | ORAL_TABLET | Freq: Three times a day (TID) | ORAL | Status: AC
Start: 2022-12-23 — End: 2022-12-25
  Administered 2022-12-23 – 2022-12-25 (×6): 97.2 mg
  Filled 2022-12-23 (×6): qty 3

## 2022-12-23 MED ORDER — LEVETIRACETAM 500 MG PO TABS
1000.0000 mg | ORAL_TABLET | Freq: Two times a day (BID) | ORAL | Status: DC
  Administered 2022-12-23 – 2022-12-26 (×6): 1000 mg
  Filled 2022-12-23 (×6): qty 2

## 2022-12-23 MED ORDER — OSMOLITE 1.5 CAL PO LIQD
1000.0000 mL | ORAL | Status: DC
  Administered 2022-12-23 – 2022-12-24 (×2): 1000 mL
  Filled 2022-12-23: qty 1000

## 2022-12-23 MED ORDER — POTASSIUM CHLORIDE 20 MEQ PO PACK
40.0000 meq | PACK | Freq: Once | ORAL | Status: AC
  Administered 2022-12-23: 40 meq

## 2022-12-23 MED ORDER — ADULT MULTIVITAMIN W/MINERALS CH
1.0000 | ORAL_TABLET | Freq: Every day | ORAL | Status: DC
  Administered 2022-12-24 – 2022-12-26 (×3): 1
  Filled 2022-12-23 (×3): qty 1

## 2022-12-23 MED ORDER — PROSOURCE TF20 ENFIT COMPATIBL EN LIQD
60.0000 mL | Freq: Two times a day (BID) | ENTERAL | Status: DC
  Administered 2022-12-23 – 2022-12-25 (×4): 60 mL
  Filled 2022-12-23 (×4): qty 60

## 2022-12-23 MED ORDER — THIAMINE MONONITRATE 100 MG PO TABS
100.0000 mg | ORAL_TABLET | Freq: Every day | ORAL | Status: DC
  Administered 2022-12-24 – 2022-12-26 (×3): 100 mg
  Filled 2022-12-23 (×3): qty 1

## 2022-12-23 MED ORDER — LEVETIRACETAM 500 MG PO TABS: 1000.0000 mg | ORAL_TABLET | Freq: Two times a day (BID) | ORAL | Status: DC

## 2022-12-23 MED ORDER — BUSPIRONE HCL 10 MG PO TABS: 10.0000 mg | ORAL_TABLET | Freq: Three times a day (TID) | ORAL | Status: DC

## 2022-12-23 MED ORDER — FREE WATER
200.0000 mL | Freq: Four times a day (QID) | Status: DC
  Administered 2022-12-23 – 2022-12-27 (×16): 200 mL

## 2022-12-23 MED ORDER — POLYETHYLENE GLYCOL 3350 17 G PO PACK
17.0000 g | PACK | Freq: Every day | ORAL | Status: DC
  Administered 2022-12-24: 17 g
  Filled 2022-12-23 (×2): qty 1

## 2022-12-23 MED ORDER — PHENOBARBITAL 32.4 MG PO TABS
64.8000 mg | ORAL_TABLET | Freq: Three times a day (TID) | ORAL | Status: AC
  Administered 2022-12-25 – 2022-12-27 (×6): 64.8 mg
  Filled 2022-12-23 (×6): qty 2

## 2022-12-23 MED ORDER — CARVEDILOL 12.5 MG PO TABS
25.0000 mg | ORAL_TABLET | Freq: Two times a day (BID) | ORAL | Status: DC
  Administered 2022-12-23 – 2022-12-26 (×7): 25 mg
  Filled 2022-12-23 (×7): qty 2

## 2022-12-23 MED ORDER — PHENOBARBITAL 32.4 MG PO TABS: 32.4000 mg | ORAL_TABLET | Freq: Three times a day (TID) | ORAL | Status: DC

## 2022-12-23 MED ORDER — DOCUSATE SODIUM 50 MG/5ML PO LIQD
100.0000 mg | Freq: Two times a day (BID) | ORAL | Status: DC
  Administered 2022-12-23 – 2022-12-24 (×4): 100 mg
  Filled 2022-12-23 (×5): qty 10

## 2022-12-23 MED ORDER — PHENOBARBITAL 32.4 MG PO TABS: 97.2000 mg | ORAL_TABLET | Freq: Three times a day (TID) | ORAL | Status: DC

## 2022-12-23 MED ORDER — LEVETIRACETAM 500 MG PO TABS
500.0000 mg | ORAL_TABLET | Freq: Once | ORAL | Status: AC
  Administered 2022-12-23: 500 mg via ORAL
  Filled 2022-12-23: qty 1

## 2022-12-23 MED ORDER — DEXTROSE 50 % IV SOLN
12.5000 g | INTRAVENOUS | Status: AC
  Administered 2022-12-23: 12.5 g via INTRAVENOUS

## 2022-12-23 MED ORDER — PANTOPRAZOLE SODIUM 40 MG PO TBEC: 40.0000 mg | DELAYED_RELEASE_TABLET | Freq: Every day | ORAL | Status: DC

## 2022-12-23 MED ORDER — POTASSIUM CHLORIDE 20 MEQ PO PACK
40.0000 meq | PACK | Freq: Once | ORAL | Status: DC
  Filled 2022-12-23: qty 2

## 2022-12-23 MED ORDER — LORAZEPAM 2 MG/ML IJ SOLN
1.0000 mg | INTRAMUSCULAR | Status: DC | PRN
  Administered 2022-12-23 – 2022-12-24 (×3): 1 mg via INTRAVENOUS
  Filled 2022-12-23: qty 1

## 2022-12-23 MED ORDER — PHENOBARBITAL 32.4 MG PO TABS: 64.8000 mg | ORAL_TABLET | Freq: Three times a day (TID) | ORAL | Status: DC

## 2022-12-23 NOTE — Progress Notes (Addendum)
Initial Nutrition Assessment  DOCUMENTATION CODES:   Non-severe (moderate) malnutrition in context of social or environmental circumstances  INTERVENTION:   Initiate tube feeding via Cortrak: Osmolite 1.5 at 25 ml/h and increase by 10 ml every 8 hours to goal rate of 55 ml/hr (1320 ml per day) Prosource TF20 60 ml BID  Provides 2140 kcal, 122 gm protein, 1003 ml free water daily  200 ml free water every 6 hours Total free water: 1803 ml   100 mg thiamine daily x 7 days   NUTRITION DIAGNOSIS:   Moderate Malnutrition related to social / environmental circumstances as evidenced by moderate fat depletion, moderate muscle depletion.  GOAL:   Patient will meet greater than or equal to 90% of their needs  MONITOR:   TF tolerance  REASON FOR ASSESSMENT:   Consult Assessment of nutrition requirement/status, Enteral/tube feeding initiation and management  ASSESSMENT:   Pt with PMH of sz and ETOH use admitted with seizures.   Pt discussed during ICU rounds and with RN and MD.   10/29 - extubated 10/30 - s/p cortrak placement; tip gastric per xray   Admit weight: 82.5 kg   Current weight:  Last Weight  Most recent update: 12/23/2022  6:11 AM    Weight  82.4 kg (181 lb 10.5 oz)              Intake/Output Summary (Last 24 hours) at 12/23/2022 1618 Last data filed at 12/23/2022 1300 Gross per 24 hour  Intake 457.01 ml  Output 400 ml  Net 57.01 ml   Net IO Since Admission: 2,160.82 mL [12/23/22 1618]  Drains/Lines: 10 F Cortrak tube; tip gastric    Nutritionally Relevant Medications: Scheduled Meds:  docusate  100 mg Per Tube BID   free water  200 mL Per Tube Q6H   [START ON 12/24/2022] multivitamin with minerals  1 tablet Per Tube Daily   phenobarbital  97.2 mg Per Tube Q8H   Followed by   [START ON 12/25/2022] phenobarbital  64.8 mg Per Tube Q8H   Followed by   Melene Muller ON 12/27/2022] phenobarbital  32.4 mg Per Tube Q8H   [START ON 12/24/2022]  polyethylene glycol  17 g Per Tube Daily   Labs Reviewed: K 3.3, PO4 3.1, Mag 1.8 Vitamin B12 948 CBG ranges from 67-115 mg/dL over the last 24 hours    NUTRITION - FOCUSED PHYSICAL EXAM:  Flowsheet Row Most Recent Value  Orbital Region No depletion  Upper Arm Region Moderate depletion  Thoracic and Lumbar Region Unable to assess  Buccal Region Mild depletion  Temple Region Moderate depletion  Clavicle Bone Region Moderate depletion  Clavicle and Acromion Bone Region Moderate depletion  Scapular Bone Region Unable to assess  Dorsal Hand Unable to assess  Patellar Region Unable to assess  Anterior Thigh Region Unable to assess  Posterior Calf Region Unable to assess  Edema (RD Assessment) Mild  Hair Reviewed  Eyes Reviewed  Mouth Unable to assess  Skin Reviewed  Nails Unable to assess       Diet Order:   Diet Order             Diet clear liquid Room service appropriate? Yes; Fluid consistency: Thin  Diet effective now                   EDUCATION NEEDS:   Not appropriate for education at this time  Skin:  Skin Assessment: Reviewed RN Assessment  Last BM:  10/30 small; type 4  Height:   Ht Readings from Last 1 Encounters:  12/21/22 5\' 11"  (1.803 m)    Weight:   Wt Readings from Last 1 Encounters:  12/23/22 82.4 kg    BMI:  Body mass index is 25.34 kg/m.  Estimated Nutritional Needs:   Kcal:  2100-2300  Protein:  115-130 grams  Fluid:  >2 L/day  Cammy Copa., RD, LDN, CNSC See AMiON for contact information

## 2022-12-23 NOTE — Evaluation (Signed)
Occupational Therapy Evaluation Patient Details Name: John Spears MRN: [id number] DOB: Nov 04, 1950 Today's Date: 12/23/2022   History of Present Illness 72 yo male s/p fall 10/28 with seizure at home. On the ground ~3 hours prior to call for EMS, unresponsive, SBP 240. CIWA due to ETOH abuse. ETT 10/28-10/29. PMH: seizures on keppra, HTN, L MCA CVA   Clinical Impression   PT admitted with s/p fall with seizure. Pt currently with functional limitiations due to the deficits listed below (see OT problem list). Pt at baseline lives with sister and indep from chart review. Pt at this time with noticeable R side weakness and decreased cognition. Pt resistant to any and all mobility this session. Pt pulling at all textures that touch LUE. Pt with increased restlessness and showing signs of irritable so returned to supine for safety of staff and patient.  Pt will benefit from skilled OT to increase their independence and safety with adls and balance to allow discharge skilled inpatient follow up therapy, <3 hours/day. If patient begins to progress with therapy will consider higher level of therapy follow up .        If plan is discharge home, recommend the following: Two people to help with walking and/or transfers;Two people to help with bathing/dressing/bathroom    Functional Status Assessment  Patient has had a recent decline in their functional status and demonstrates the ability to make significant improvements in function in a reasonable and predictable amount of time.  Equipment Recommendations  [employer];Hospital bed;Wheelchair cushion (measurements OT);Wheelchair (measurements OT)    Recommendations for Other Services Speech consult     Precautions / Restrictions Precautions Precautions: Fall;Other (comment) Precaution Comments: posey belt, bil mittens, cortrak, restless/impulsive Restrictions Weight Bearing Restrictions: No      Mobility Bed Mobility Overal bed mobility:  Needs Assistance Bed Mobility: Supine to Sit, Sit to Supine     Supine to sit: Mod assist, +2 for physical assistance, +2 for safety/equipment, HOB elevated Sit to supine: Total assist, +2 for physical assistance, +2 for safety/equipment, HOB elevated (+3)   General bed mobility comments: Pt actively and impulsively lifting his trunk off the bed but needed modA x2 to direct his trunk and legs to the L to sit up L EOB. Pt became increasingly restless and agitated as the session progressed and needed total assist x3 to safely manage pt back to supine, preventing him from pulling out his line with his UEs.    Transfers Overall transfer level: Needs assistance Equipment used: 2 person hand held assist Transfers: Sit to/from Stand Sit to Stand: Total assist, +2 safety/equipment, +2 physical assistance           General transfer comment: Attempted transfer to stand from EOB several times, but pt actively resisting and extending his trunk instead, thus unable to stand up with total assist x2, partially lifted buttocks off bed 1x though.      Balance Overall balance assessment: Needs assistance Sitting-balance support: No upper extremity supported, Feet supported Sitting balance-Leahy Scale: Poor Sitting balance - Comments: ModA majority of time due to posterior and R lateral lean, but at times pt briefly progressed to CGA and then needed up to maxA depending on his level of actively resisting therapists       Standing balance comment: unable to stand today, pt actively resisting                           ADL either performed  or assessed with clinical judgement   ADL Overall ADL's : Needs assistance/impaired Eating/Feeding: NPO                                     General ADL Comments: total (A) at this time. pt attempting to doff mittens.     Vision   Vision Assessment?: Vision impaired- to be further tested in functional context Additional Comments: L  gaze not following commands to complete visual testing. pt with all visual input response in L visual field     Perception         Praxis         Pertinent Vitals/Pain Pain Assessment Pain Assessment: Faces Faces Pain Scale: Hurts little more Pain Location: pulling at lines, appeared agitated Pain Descriptors / Indicators: Other (Comment) (pulling at lines) Pain Intervention(s): Monitored during session, Repositioned     Extremity/Trunk Assessment Upper Extremity Assessment Upper Extremity Assessment: RUE deficits/detail RUE Deficits / Details: decreased initiation with R UE. pt decreased fine motor. pt using his L digits to push against pulseox instead of pulling with R UE on line   Lower Extremity Assessment Lower Extremity Assessment: Defer to PT evaluation RLE Deficits / Details: pt not following cues to move legs, spontaneous instead, but noted to move R leg less than L; if provided PROM or noxious stimuli to R leg he was able to provide good active resistance and hold it up against gravity   Cervical / Trunk Assessment Cervical / Trunk Assessment: Normal   Communication Communication Communication: Difficulty following commands/understanding;Difficulty communicating thoughts/reduced clarity of speech Following commands: Follows one step commands inconsistently;Follows one step commands with increased time Cueing Techniques: Verbal cues;Gestural cues;Tactile cues;Visual cues (does not follow commands. pt internally distracted. pt pulling at any texture touching L hand)   Cognition Arousal: Alert Behavior During Therapy: Flat affect, Impulsive, Restless, Agitated Overall Cognitive Status: No family/caregiver present to determine baseline cognitive functioning                                 General Comments: Pt mumbling and difficult to understand. Pt was very restless and pulling at his lines, more with his R UE than his L. Seemed inattention to his R at  times. Difficult to redirect and pt became increasing resistive and agitated as session progressed. Did not follow simple cues, more actively resisting     General Comments  VSS on RA    Exercises     Shoulder Instructions      Home Living Family/patient expects to be discharged to:: Private residence Living Arrangements: Other relatives (sister - per RN, no family present to confirm and pt unreliable)                               Additional Comments: lives at home with sister. Pt came in as [place] doe from home this admission      Prior Functioning/Environment Prior Level of Function : Independent/Modified Independent;Patient poor historian/Family not available (assumed, no family present to confirm)                        OT Problem List: Decreased strength;Impaired balance (sitting and/or standing);Decreased knowledge of precautions;Decreased knowledge of use of DME or AE;Decreased safety awareness;Decreased cognition;Impaired UE functional  use      OT Treatment/Interventions: Self-care/ADL training;Therapeutic exercise;Neuromuscular education;Energy conservation;DME and/or AE instruction;Manual therapy;Modalities;Therapeutic activities;Balance training;Patient/family education;Cognitive remediation/compensation;Visual/perceptual remediation/compensation    OT Goals(Current goals can be found in the care plan section) Acute Rehab OT Goals Patient Stated Goal: none OT Goal Formulation: Patient unable to participate in goal setting Time For Goal Achievement: 01/06/23 Potential to Achieve Goals: Good  OT Frequency: Min 1X/week    Co-evaluation PT/OT/SLP Co-Evaluation/Treatment: Yes Reason for Co-Treatment: Necessary to address cognition/behavior during functional activity;For patient/therapist safety;To address functional/ADL transfers PT goals addressed during session: Mobility/safety with mobility;Balance;Strengthening/ROM OT goals addressed during  session: ADL's and self-care;Strengthening/ROM      AM-PAC OT "6 Clicks" Daily Activity     Outcome Measure Help from another person eating meals?: Total Help from another person taking care of personal grooming?: Total Help from another person toileting, which includes using toliet, bedpan, or urinal?: Total Help from another person bathing (including washing, rinsing, drying)?: Total Help from another person to put on and taking off regular upper body clothing?: Total Help from another person to put on and taking off regular lower body clothing?: Total 6 Click Score: 6   End of Session Nurse Communication: Mobility status;Precautions;Need for lift equipment  Activity Tolerance: Patient tolerated treatment well Patient left: in bed;with call bell/phone within reach;with bed alarm set;with restraints reapplied  OT Visit Diagnosis: Unsteadiness on feet (R26.81);Muscle weakness (generalized) (M62.81)                Time: 3086-5784 OT Time Calculation (min): 18 min Charges:  OT General Charges $OT Visit: 1 Visit OT Evaluation $OT Eval Moderate Complexity: 1 Mod   Brynn, OTR/L  Acute Rehabilitation Services Office: [phone] .   Mateo Flow 12/23/2022, 2:40 PM

## 2022-12-23 NOTE — Evaluation (Signed)
Physical Therapy Evaluation Patient Details Name: John Spears MRN: [id number] DOB: 05-15-1950 Today's Date: 12/23/2022  History of Present Illness  72 yo male s/p fall 10/28 with seizure at home. On the ground ~3 hours prior to call for EMS, unresponsive, SBP 240. CIWA due to ETOH abuse. ETT 10/28-10/29. PMH: seizures on keppra, HTN, L MCA CVA   Clinical Impression  Pt presents with condition above and deficits mentioned below, see PT Problem List. Pt is currently confused and does not have any family present to provide PLOF and home set-up info. RN reports they were told pt lives with his sister. Pt likely was independent at baseline. Currently, pt is restless, impulsive, agitated, and not following cues. He is frequently pulling at lines and difficult to redirect. He required modAx2 to guide him supine to sit L EOB but total assist x3 to return him to supine while avoiding him pulling out any lines. Pt actively resists therapists frequently and was unable to stand due to this today. He does display less spontaneous movement of his R leg compared to his L, but can display good strength by resisting PROM and reactively holding the R leg off the bed against gravity to prevent it from dropping. At this time, pt would benefit from short-term inpatient rehab, < 3 hours/day. Will continue to follow acutely.      If plan is discharge home, recommend the following: Two people to help with walking and/or transfers;Two people to help with bathing/dressing/bathroom;Assistance with cooking/housework;Direct supervision/assist for financial management;Direct supervision/assist for medications management;Assist for transportation;Help with stairs or ramp for entrance   Can travel by private vehicle   No    Equipment Recommendations Other (comment) (TBA)  Recommendations for Other Services       Functional Status Assessment Patient has had a recent decline in their functional status and demonstrates  the ability to make significant improvements in function in a reasonable and predictable amount of time.     Precautions / Restrictions Precautions Precautions: Fall;Other (comment) Precaution Comments: posey belt, bil mittens, cortrak, restless/impulsive Restrictions Weight Bearing Restrictions: No      Mobility  Bed Mobility Overal bed mobility: Needs Assistance Bed Mobility: Supine to Sit, Sit to Supine     Supine to sit: Mod assist, +2 for physical assistance, +2 for safety/equipment, HOB elevated Sit to supine: Total assist, +2 for physical assistance, +2 for safety/equipment, HOB elevated (+3)   General bed mobility comments: Pt actively and impulsively lifting his trunk off the bed but needed modA x2 to direct his trunk and legs to the L to sit up L EOB. Pt became increasingly restless and agitated as the session progressed and needed total assist x3 to safely manage pt back to supine, preventing him from pulling out his line with his UEs.    Transfers Overall transfer level: Needs assistance Equipment used: 2 person hand held assist Transfers: Sit to/from Stand Sit to Stand: Total assist, +2 safety/equipment, +2 physical assistance           General transfer comment: Attempted transfer to stand from EOB several times, but pt actively resisting and extending his trunk instead, thus unable to stand up with total assist x2, partially lifted buttocks off bed 1x though.    Ambulation/Gait               General Gait Details: deferred, unsafe this date as pt actively resisting standing  Careers information officer  Tilt Bed    Modified Rankin (Stroke Patients Only) Modified Rankin (Stroke Patients Only) Pre-Morbid Rankin Score: No symptoms Modified Rankin: Severe disability     Balance Overall balance assessment: Needs assistance Sitting-balance support: No upper extremity supported, Feet supported Sitting balance-Leahy Scale:  Poor Sitting balance - Comments: ModA majority of time due to posterior and R lateral lean, but at times pt briefly progressed to CGA and then needed up to maxA depending on his level of actively resisting therapists       Standing balance comment: unable to stand today, pt actively resisting                             Pertinent Vitals/Pain Pain Assessment Pain Assessment: Faces Faces Pain Scale: Hurts little more Pain Location: pulling at lines, appeared agitated Pain Descriptors / Indicators: Other (Comment) (pulling at lines) Pain Intervention(s): Limited activity within patient's tolerance, Monitored during session, Repositioned    Home Living Family/patient expects to be discharged to:: Private residence Living Arrangements: Other relatives (sister - per RN, no family present to confirm and pt unreliable)                      Prior Function Prior Level of Function : Independent/Modified Independent;Patient poor historian/Family not available (assumed, no family present to confirm)                     Extremity/Trunk Assessment   Upper Extremity Assessment Upper Extremity Assessment: Defer to OT evaluation    Lower Extremity Assessment Lower Extremity Assessment: RLE deficits/detail RLE Deficits / Details: pt not following cues to move legs, spontaneous instead, but noted to move R leg less than L; if provided PROM or noxious stimuli to R leg he was able to provide good active resistance and hold it up against gravity    Cervical / Trunk Assessment Cervical / Trunk Assessment: Normal  Communication   Communication Communication: Difficulty following commands/understanding;Difficulty communicating thoughts/reduced clarity of speech Following commands: Follows one step commands inconsistently;Follows one step commands with increased time Cueing Techniques: Verbal cues;Gestural cues;Tactile cues;Visual cues  Cognition Arousal: Alert Behavior  During Therapy: Flat affect, Impulsive, Restless, Agitated Overall Cognitive Status: No family/caregiver present to determine baseline cognitive functioning                                 General Comments: Pt mumbling and difficult to understand. Pt was very restless and pulling at his lines, more with his R UE than his L. Seemed inattention to his R at times. Difficult to redirect and pt became increasing resistive and agitated as session progressed. Did not follow simple cues, more actively resisting        General Comments General comments (skin integrity, edema, etc.): VSS on RA    Exercises     Assessment/Plan    PT Assessment Patient needs continued PT services  PT Problem List Decreased strength;Decreased activity tolerance;Decreased balance;Decreased mobility;Decreased cognition;Decreased coordination;Decreased knowledge of use of DME;Decreased safety awareness       PT Treatment Interventions Gait training;DME instruction;Stair training;Functional mobility training;Therapeutic activities;Therapeutic exercise;Balance training;Neuromuscular re-education;Cognitive remediation;Patient/family education    PT Goals (Current goals can be found in the Care Plan section)  Acute Rehab PT Goals Patient Stated Goal: did not state PT Goal Formulation: Patient unable to participate in goal setting Time For Goal Achievement: 01/06/23  Potential to Achieve Goals: Fair    Frequency Min 1X/week     Co-evaluation PT/OT/SLP Co-Evaluation/Treatment: Yes Reason for Co-Treatment: Necessary to address cognition/behavior during functional activity;For patient/therapist safety;To address functional/ADL transfers PT goals addressed during session: Mobility/safety with mobility;Balance;Strengthening/ROM         AM-PAC PT "6 Clicks" Mobility  Outcome Measure Help needed turning from your back to your side while in a flat bed without using bedrails?: Total Help needed moving  from lying on your back to sitting on the side of a flat bed without using bedrails?: Total Help needed moving to and from a bed to a chair (including a wheelchair)?: Total Help needed standing up from a chair using your arms (e.g., wheelchair or bedside chair)?: Total Help needed to walk in hospital room?: Total Help needed climbing 3-5 steps with a railing? : Total 6 Click Score: 6    End of Session   Activity Tolerance: Treatment limited secondary to agitation Patient left: in bed;with call bell/phone within reach;with bed alarm set;with restraints reapplied;with nursing/sitter in room Nurse Communication: Mobility status PT Visit Diagnosis: Unsteadiness on feet (R26.81);Muscle weakness (generalized) (M62.81);Difficulty in walking, not elsewhere classified (R26.2);Other symptoms and signs involving the nervous system (R29.898)    Time: 4696-2952 PT Time Calculation (min) (ACUTE ONLY): 20 min   Charges:   PT Evaluation $PT Eval Moderate Complexity: 1 Mod   PT General Charges $$ ACUTE PT VISIT: 1 Visit         Virgil Benedict, PT, DPT Acute Rehabilitation Services  Office: [phone]   Bettina Gavia 12/23/2022, 1:53 PM

## 2022-12-23 NOTE — Progress Notes (Signed)
NAME:  Breven Braaten, MRN:  [id number], DOB:  Feb 15, 1951, LOS: 2 ADMISSION DATE:  12/21/2022, CHIEF COMPLAINT: Encephalopathy, seizures, ground-level fall  History of Present Illness:  As yet unidentified man, in his 10s, brought to the ED on 10/28 after falling and having witnessed seizure activity.  This was apparently 3 hours prior to transfer.  He reportedly was able to stand up and then fell again and seized again.  He was found by EMS unresponsive, hypertensive SBP 240s.  He had a seizure and route to the ED with EMS. No outward signs of trauma on his initial evaluation, trauma scans initiated as below and reassuring.  Question right basilar atelectasis versus infiltrate. Lactic acid 12.5, CO2 21 Mild anemia, mild thrombocytopenia EtOH 11 (mild elevation)  Addendum: Patient's ID found in his belongings, name Teejay L. Harthun, 1950-08-05.  Still need to try to get his old chart information, PMH, family contacts, etc.   Pertinent  Medical History  Unknown at this time  Significant Hospital Events: Including procedures, antibiotic start and stop dates in addition to other pertinent events   Head and cervical spine CT 10/28 > no acute intracranial injury, no calvarial fracture.  Remote left posterior MCA infarct.  No acute fracture or listhesis of the cervical spine.  Mild bilateral preseptal soft tissue swelling ocular globes are intact CT chest/abdomen/pelvis.  3.3 cm descending TAA.  Mild dependent subpleural right lower lobe consolidation, atelectasis versus contusion versus infection. 10/29- no sz, extubated  10/30 add phenobarb. Txf out of ICU   Interim History / Subjective:  Extubated yesterday NAEO   AM labs are still pending collection   Objective   Blood pressure (!) 177/82, pulse 71, temperature 99 F (37.2 C), temperature source Oral, resp. rate 15, height 5\' 11"  (1.803 m), weight 82.4 kg, SpO2 97%.        Intake/Output Summary (Last 24 hours) at 12/23/2022  1023 Last data filed at 12/23/2022 0800 Gross per 24 hour  Intake 527.91 ml  Output 465 ml  Net 62.91 ml   Filed Weights   12/21/22 1927 12/22/22 0606 12/23/22 0500  Weight: 75 kg 82.5 kg 82.4 kg    Examination: General: Chronically ill elderly M NAD  HENT: Hard C collar in place. ETT secure. Anicteric sclera  Lungs: Upper lobe rhonchi bilaterally. Mechanically ventilated, symmetrical chest expansion  Cardiovascular: rrr cap refill is Abdomen:  Extremities: no acute joint deformity  Skin: diaphoretic face  Neuro: Awake oriented x2. Intermittent tremor.   Resolved Hospital Problem list   Acute resp failure w hypoxia HTN urgency  Ground level fall // C spine precautions  Assessment & Plan:   Acute encephalopathy Hx sz with breakthrough sz EtOH abuse  -Rx keppra OP  -drinks 6-12 beers/d + 1 bottle of liquor if available.  -sounds like sister gives him his daily meds but there are instances when he throws his meds away. Isnt sure how compliant he is w his Keppra. Conversely, these could be r/t his etoh use  -cEEG dc 10/29 -10/30 starting to have incr withdrawal sx  P -cont home keppra -folate thiamin MV. Dc B12 -- his levels are slightly high  -will start enteral phenobarb 10/30, high risk category. Appreciate PharmD help w dosing   -if he is more somnolent after starting phenobarb, place EtCO2. Ok for now   -think his encephalopathy is a swallowing risk, will place cortrak -PT/OT, SLP   Aspiration PNA RLL  -extubated 10/30  P -short course of unasyn  -  wean O2 for goal >92 -IS, pulm hygiene, mobility   AKI on CKD II -thought to have CKD earlier in 2024, maybe CKD II, but has had some fluctuations in renal indices, now normal on 2nd lab cycle of current admission P -start FWF after cortrak is placed -awaiting AM labs still -follow UOP   HTN -reported Bps 240s, now in 140s with sedation P -PRN hydral  -add home coreg   Lactic acidosis, improving -likely 202  sz  P -PRN LA  Anemia Thrombocytopenia -have been prevalent since 08/2022. Likely 2/2 etoh use  P -10/30 labs are pending collection still   Hypocalcemia Hypomagnesemia Hypokalemia  Hypophosphatemia  P -10/30 labs are pending   Dispo: -initiating phenobarb in ICU, but does not require staying in ICU thereafter. He is otherwise stable to transfer out, and plan will be to transfer to progressive 10/30 after phenobarb initiation, will ask TRH to take over care 10/31  Best Practice (right click and "Reselect all SmartList Selections" daily)   Diet/type: NPO Start EN  DVT prophylaxis: prophylactic heparin  GI prophylaxis: N/A Lines: N/A Foley:  N/A Code Status:  full code Last date of multidisciplinary goals of care discussion [talked to sister Kathie Rhodes 10/29]   Labs   CBC: Recent Labs  Lab 12/21/22 1820 12/21/22 1838 12/21/22 1942 12/22/22 0551 12/22/22 0616 12/23/22 0755  WBC 6.7  --   --   --  5.9 8.4  HGB 11.8* 13.6 11.2* 10.5* 10.4* 10.3*  HCT 37.5* 40.0 33.0* 31.0* 30.9* 30.9*  MCV 96.2  --   --   --  89.3 91.7  PLT 144*  --   --   --  114* 101*    Basic Metabolic Panel: Recent Labs  Lab 12/21/22 1820 12/21/22 1838 12/21/22 1942 12/22/22 0551 12/22/22 0616 12/23/22 0755  NA 137 137 136 138 140 141  K 4.1 4.2 3.9 2.9* 3.4* 3.3*  CL 99 103  --   --  103 106  CO2 16*  --   --   --  21* 24  GLUCOSE 112* 106*  --   --  75 81  BUN 12 16  --   --  10 11  CREATININE 1.31* 1.10  --   --  1.17 1.27*  CALCIUM 9.2  --   --   --  8.9 9.1  MG  --   --   --   --  1.5* 1.8  PHOS  --   --   --   --  2.1* 3.1   GFR: Estimated Creatinine Clearance: 57.6 mL/min (A) (by C-G formula based on SCr of 1.27 mg/dL (H)). Recent Labs  Lab 12/21/22 1820 12/21/22 1838 12/21/22 2110 12/21/22 2331 12/22/22 0616 12/23/22 0755  WBC 6.7  --   --   --  5.9 8.4  LATICACIDVEN  --  12.5* 4.0* 3.3*  --   --     Liver Function Tests: Recent Labs  Lab 12/21/22 1820  12/23/22 0755  AST 35 19  ALT 8 10  ALKPHOS 57 49  BILITOT 0.3 0.7  PROT 6.2* 5.6*  ALBUMIN 3.5 2.9*   No results for input(s): "LIPASE", "AMYLASE" in the last 168 hours. No results for input(s): "AMMONIA" in the last 168 hours.  ABG    Component Value Date/Time   PHART 7.502 (H) 12/22/2022 0551   PCO2ART 27.5 (L) 12/22/2022 0551   PO2ART 172 (H) 12/22/2022 0551   HCO3 21.6 12/22/2022 0551   TCO2 22  12/22/2022 0551   ACIDBASEDEF 1.0 12/22/2022 0551   O2SAT 100 12/22/2022 0551     Coagulation Profile: Recent Labs  Lab 12/21/22 1820  INR 1.1    Cardiac Enzymes: Recent Labs  Lab 12/21/22 2110  CKTOTAL 77    HbA1C: No results found for: "HGBA1C"  CBG: Recent Labs  Lab 12/22/22 1931 12/22/22 2338 12/23/22 0317 12/23/22 0350 12/23/22 0725  GLUCAP 92 81 67* 115* 77    CRITICAL CARE Performed by: Lanier Clam   N/a   Tessie Fass MSN, AGACNP-BC [place] [facility] Amion for pager  12/23/2022, 10:23 AM

## 2022-12-23 NOTE — TOC Initial Note (Signed)
Transition of Care [facility]) - Initial/Assessment Note    Patient Details  Name: John Spears MRN: [id number] Date of Birth: 06-03-1950  [facility] CM/SW Contact:    Mearl Latin, LCSW Phone Number: 12/23/2022, 3:34 PM  Clinical Narrative:                 CSW following for ETOH use and SNF recommendation. Currently on CIWA.   Expected Discharge Plan: Skilled Nursing Facility Barriers to Discharge: Continued Medical Work up, English as a second language teacher, SNF Pending bed offer   Patient Goals and CMS Choice            Expected Discharge Plan and Services In-house Referral: Clinical Social Work     Living arrangements for the past 2 months: Single Family Home                                      Prior Living Arrangements/Services Living arrangements for the past 2 months: Single Family Home   Patient language and need for interpreter reviewed:: Yes        Need for Family Participation in Patient Care: Yes (Comment) Care giver support system in place?: No (comment)   Criminal Activity/Legal Involvement Pertinent to Current Situation/Hospitalization: No - Comment as needed  Activities of Daily Living      Permission Sought/Granted                  Emotional Assessment Appearance:: Appears stated age Attitude/Demeanor/Rapport: Unable to Assess Affect (typically observed): Unable to Assess Orientation: :  (Disoriented) Alcohol / Substance Use: Alcohol Use Psych Involvement: No (comment)  Admission diagnosis:  Seizures (HCC) [R56.9] Altered mental status, unspecified altered mental status type [R41.82] Patient Active Problem List   Diagnosis Date Noted   Encephalopathy acute 12/22/2022   Acute respiratory failure with hypoxia (HCC) 12/22/2022   ETOH abuse 12/22/2022   Hypophosphatemia 12/22/2022   Thrombocytopenia (HCC) 12/22/2022   Breakthrough seizure (HCC) 12/22/2022   Seizures (HCC) 12/21/2022   PCP:  Patient, No Pcp  Per Pharmacy:   [facility] [address] [place] [address] Phone: [phone] Fax: [phone]     Social Determinants of Health (SDOH) Social History:   SDOH Interventions:     Readmission Risk Interventions     No data to display

## 2022-12-23 NOTE — Progress Notes (Signed)
Received in bed from 4N ICU.  Oriented to room and surroundings.

## 2022-12-23 NOTE — Progress Notes (Signed)
NEUROLOGY CONSULT FOLLOW UP NOTE   Date of service: December 23, 2022 Patient Name: John Spears MRN:  [id number] DOB:  [birth year]  Brief HPI  John Spears is a 72 y.o. male with a history of seizures (on Keppra at home) and HTN (on Coreg and Lasix), prior left MCA stroke, who presented to the ED this evening as a level 1 trauma after a fall at home. Family noted GTC seizure activity at home prior to him falling. Family left him on the ground for about 3 hours to see if he would recover. He reportedly was able to stand up and then fell again and seized again. He was found by EMS unresponsive, hypertensive SBP 240s. He had another seizure with EMS en route. Of note, he has a history of heavy daily EtOH use consisting of 6-12 beers per day and will also consume an entire bottle of hard liquor in one day if this form of EtOH is available to him, per wife. He arrived with a GCS of 6. EtOH level was 11 in the ED. He was intubated initially but extubated 10/29 and has been steadily improving overall  Based on chart review semiology of events is typically right gaze deviation, right sided jerking, with secondary generalization at times   Interval Hx/subjective  - Extubated  - Requiring treatment for alcohol withdrawal; receiving coretrak due to difficulty swallowing / somnolence after ativan doses  Vitals   Current vital signs: BP (!) 165/70   Pulse 82   Temp 99 F (37.2 C) (Oral)   Resp 19   Ht 5\' 11"  (1.803 m)   Wt 82.4 kg   SpO2 96%   BMI 25.34 kg/m  Vital signs in last 24 hours: Temp:  [98.4 F (36.9 C)-100.4 F (38 C)] 99 F (37.2 C) (10/30 0800) Pulse Rate:  [61-84] 82 (10/30 0800) Resp:  [12-21] 19 (10/30 0800) BP: (124-189)/(62-116) 165/70 (10/30 0800) SpO2:  [94 %-100 %] 96 % (10/30 0800) Weight:  [82.4 kg] 82.4 kg (10/30 0500)  Body mass index is 25.34 kg/m.  Physical Exam   Constitutional: Appears elderly, no acute distress Psych: Calm, interactive Eyes: No  scleral injection.  HENT: No OP obstrucion.  Head: EEG wires in place Cardiovascular: Normal rate and regular rhythm on monitor Respiratory: Slightly uncomfortable on ETT with sedation held for exam GI: Soft.  No distension. There is no tenderness.  Skin: WDI.   Neurologic Examination   Neuro: Mental Status: Patient is awake, alert, able to follow some simple commands but not all. Moderate aphasia Cranial Nerves: II: PERRL, no blink to threat on the right and does not orient to stimuli on the right III,IV, VI: Left gaze preference, able to look to the right V: Facial sensation is symmetric to light eyelash brush VII: Facial movement is notable for right facial droop VIII: hearing is intact to voice Motor: Drift of the RUE > LUE, able to lift both legs antigravity Sensory: Reactive to touch in all four    Labs and Diagnostic Imaging     Basic Metabolic Panel: Recent Labs  Lab 12/21/22 1820 12/21/22 1838 12/21/22 1942 12/22/22 0551 12/22/22 0616 12/23/22 0755  NA 137 137 136 138 140 141  K 4.1 4.2 3.9 2.9* 3.4* 3.3*  CL 99 103  --   --  103 106  CO2 16*  --   --   --  21* 24  GLUCOSE 112* 106*  --   --  75 81  BUN  12 16  --   --  10 11  CREATININE 1.31* 1.10  --   --  1.17 1.27*  CALCIUM 9.2  --   --   --  8.9 9.1  MG  --   --   --   --  1.5* 1.8  PHOS  --   --   --   --  2.1* 3.1    CBC: Recent Labs  Lab 12/21/22 1820 12/21/22 1838 12/21/22 1942 12/22/22 0551 12/22/22 0616 12/23/22 0755  WBC 6.7  --   --   --  5.9 8.4  HGB 11.8* 13.6 11.2* 10.5* 10.4* 10.3*  HCT 37.5* 40.0 33.0* 31.0* 30.9* 30.9*  MCV 96.2  --   --   --  89.3 91.7  PLT 144*  --   --   --  114* 101*    Coagulation Studies: Recent Labs    12/21/22 1820  LABPROT 14.0  INR 1.1    Lab Results  Component Value Date   VITAMINB12 948 (H) 12/22/2022     Urine Drug Screen: No results found for: "LABOPIA", "COCAINSCRNUR", "LABBENZ", "AMPHETMU", "THCU", "LABBARB"  Alcohol Level      Component Value Date/Time   ETH 11 (H) 12/21/2022 1820   INR  Lab Results  Component Value Date   INR 1.1 12/21/2022   CT Head without contrast(Personally reviewed): No acute intracranial injury. No calvarial fracture. Remote left posterior MCA division cortical infarct.   CT C-spine: No acute fracture or listhesis of the cervical spine.   CT chest: Mild consolidation within the dependent, subpleural right lung may reflect changes related to acute infection or contusion. 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   MRI brain results from 02/21/2022: IMPRESSION:  1. No acute intracranial abnormality.  2. Old left parietal lobe infarct and findings of chronic small  vessel disease.   EEG report pending Preliminary report negative for seizures  Impression   John Spears is a 72 y.o. 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  Fortunately his clinical exam is continuing to improve; left gaze preference and right-sided weakness consistent with postictal Todd's phenomena  Recommendations   - Appreciate CIWA protocol per critical care team, phenobarbital is often particularly helpful in patients with comorbid alcoholism and epilepsy - Agree with thiamine, folate, MVI - B12 level adequate, no need to supplement - Continue Keppra 1000 mg BID, convert one-to-one to p.o. or per tube as able - Please notify neurology for any further seizures or other concerns. Will sign off at this time, but available if needed.  ______________________________________________________________________   Thank you for the opportunity to take part in the care of this patient. If you have any further questions, please contact the neurology consultation team on call. Updated oncall schedule is listed on  AMION.  Signed,  Brooke Dare MD-PhD [facility] [phone]  Available 7 AM to 7 PM, outside these hours please contact Neurologist on call listed on  AMION

## 2022-12-23 NOTE — Procedures (Signed)
Cortrak  Tube Type:  Cortrak - 43 inches Tube Location:  Left nare Secured by: Bridle Technique Used to Measure Tube Placement:  Marking at nare/corner of mouth Cortrak Secured At:  70 cm   Cortrak Tube Team Note:  Consult received to place a Cortrak feeding tube.   X-ray is required, abdominal x-ray has been ordered by the Cortrak team. Please confirm tube placement before using the Cortrak tube.   If the tube becomes dislodged please keep the tube and contact the Cortrak team at [email or web address] for replacement.  If after hours and replacement cannot be delayed, place a NG tube and confirm placement with an abdominal x-ray.    Tisheena Maguire MS, RD, LDN Please refer to AMION for RD and/or RD on-call/weekend/after hours pager   

## 2022-12-24 DIAGNOSIS — R569 Unspecified convulsions: Secondary | ICD-10-CM | POA: Diagnosis not present

## 2022-12-24 DIAGNOSIS — E440 Moderate protein-calorie malnutrition: Secondary | ICD-10-CM | POA: Insufficient documentation

## 2022-12-24 LAB — BASIC METABOLIC PANEL
Anion gap: 9 (ref 5–15)
BUN: 13 mg/dL (ref 8–23)
CO2: 22 mmol/L (ref 22–32)
Calcium: 8.8 mg/dL — ABNORMAL LOW (ref 8.9–10.3)
Chloride: 108 mmol/L (ref 98–111)
Creatinine, Ser: 1.09 mg/dL (ref 0.61–1.24)
GFR, Estimated: >60 mL/min (ref >60)
Glucose, Bld: 96 mg/dL (ref 70–99)
Potassium: 3.4 mmol/L — ABNORMAL LOW (ref 3.5–5.1)
Sodium: 139 mmol/L (ref 135–145)

## 2022-12-24 LAB — CBC
HCT: 30.4 % — ABNORMAL LOW (ref 39.0–52.0)
Hemoglobin: 9.8 g/dL — ABNORMAL LOW (ref 13.0–17.0)
MCH: 29.7 pg (ref 26.0–34.0)
MCHC: 32.2 g/dL (ref 30.0–36.0)
MCV: 92.1 fL (ref 80.0–100.0)
Platelets: 90 10*3/uL — ABNORMAL LOW (ref 150–400)
RBC: 3.3 MIL/uL — ABNORMAL LOW (ref 4.22–5.81)
RDW: 14.6 % (ref 11.5–15.5)
WBC: 7.6 10*3/uL (ref 4.0–10.5)
nRBC: 0 % (ref 0.0–0.2)

## 2022-12-24 LAB — MAGNESIUM
Magnesium: 1.6 mg/dL — ABNORMAL LOW (ref 1.7–2.4)
Magnesium: 3.3 mg/dL — ABNORMAL HIGH (ref 1.7–2.4)

## 2022-12-24 LAB — PHOSPHORUS
Phosphorus: 2.9 mg/dL (ref 2.5–4.6)
Phosphorus: 2.3 mg/dL — ABNORMAL LOW (ref 2.5–4.6)

## 2022-12-24 LAB — GLUCOSE, CAPILLARY
Glucose-Capillary: 111 mg/dL — ABNORMAL HIGH (ref 70–99)
Glucose-Capillary: 81 mg/dL (ref 70–99)
Glucose-Capillary: 121 mg/dL — ABNORMAL HIGH (ref 70–99)
Glucose-Capillary: 119 mg/dL — ABNORMAL HIGH (ref 70–99)
Glucose-Capillary: 143 mg/dL — ABNORMAL HIGH (ref 70–99)
Glucose-Capillary: 116 mg/dL — ABNORMAL HIGH (ref 70–99)
Glucose-Capillary: 91 mg/dL (ref 70–99)

## 2022-12-24 MED ORDER — DEXTROSE 5 % IV SOLN
6.0000 g | Freq: Once | INTRAVENOUS | Status: AC
  Administered 2022-12-24: 6 g via INTRAVENOUS
  Filled 2022-12-24: qty 12

## 2022-12-24 MED ORDER — POTASSIUM CHLORIDE 20 MEQ PO PACK
60.0000 meq | PACK | Freq: Once | ORAL | Status: AC
  Administered 2022-12-24: 60 meq
  Filled 2022-12-24: qty 3

## 2022-12-24 NOTE — Plan of Care (Signed)

## 2022-12-24 NOTE — Progress Notes (Signed)
PROGRESS NOTE  John Spears  DOB: [birth year]  PCP: Patient, No Pcp Per SAY:[id number]  DOA: 12/21/2022  LOS: 3 days  Hospital Day: 4  Brief narrative: John Spears is a 72 y.o. male with PMH significant for HTN, prior left MCA stroke, seizure with questionable compliance to Keppra, chronic alcoholism drinks about 6-12 beers per day as well as an entire bottle of hard liquor. 10/28, patient was brought to the ED after a witnessed GTCS leading to fall.  Per report, family left him on the ground for about 3 hours to see if he would recover.  He was subsequently able to stand up and then fell again and seized again. EMS found him unresponsive with blood pressure elevated to 40s.  En route to the ED, he had another episode of seizure.  In the ED he was postictal and unable to protect airway requiring intubation and mechanical ventilation. On exam, he was also noted to have left gaze preference and right-sided weakness consistent with postictal Todd's phenomena. Neurology started him on Keppra. Labs with lactic acid significantly elevated to 12.5, blood alcohol level mildly elevated to 11. CT head and cervical spine did not show any intracranial injury or cervical fracture/dislocation.  It showed a remote left posterior MCA infarct. CT chest/abdomen/pelvis showed right lower lobe consolidation raising concern of infection. He was admitted to ICU, started on IV Unasyn, CIWA protocol  His clinical status gradually improved, extubated next day on 10/29.  Transferred [facility] to [facility] on 10/31.  Subjective: Patient was seen and examined this morning.  Elderly African-American male.  Propped up in bed.  Looks weak.  Alert, awake, mumbles to questions.  Not able to answer appropriately.  Has a NG tube in place. Family not at bedside. Chart reviewed. In the last 24 hours, no fever, heart rate in 80s, blood pressure consistently elevated mostly in 150s, this morning further about 189/93 Most  recent labs from this morning with potassium low at 3.4, hemoglobin low at 9.8  Assessment and plan: GTCS In the setting of chronic heavy alcoholism, suspect noncompliance to Keppra. Neurology has restarted on Keppra 1000 mg twice daily Seizure precautions  Acute metabolic encephalopathy Todd's paresis Altered mentation due to postictal status.  Initial left gaze preference and right-sided weakness due to Todd's paresis. He seems weak and is mumbling to questions.  Continue to monitor mental status.  Acute respiratory failure with hypoxia Initially unable to protect airway because of low GCS.  Required intubation for about 24 hours.  Successfully extubated.  Currently on room air  Acute alcohol withdrawal symptoms Chronic alcohol abuse Currently on CIWA protocol with phenobarbital taper Continue multivitamins  Dysphagia Currently on NG tube feeding.  Obtain SLP evaluation   Mobility: Needs PT eval once mental status improves  Goals of care   Code Status: Full Code     DVT prophylaxis:  heparin injection 5,000 Units Start: 12/21/22 2200 SCDs Start: 12/21/22 1933   Antimicrobials: IV Unasyn Fluid: None Consultants: None Family Communication: None at bedside  Status: Inpatient Level of care:  Progressive   Patient is from: Home Needs to continue in-hospital care: Mental status remains altered.  Remains on tube feeding Anticipated d/c to: Pending clinical course      Diet:  Diet Order             Diet clear liquid Room service appropriate? Yes; Fluid consistency: Thin  Diet effective now  Scheduled Meds:  carvedilol  25 mg Per Tube BID WC   Chlorhexidine Gluconate Cloth  6 each Topical Daily   docusate  100 mg Per Tube BID   feeding supplement (PROSource TF20)  60 mL Per Tube BID   free water  200 mL Per Tube Q6H   heparin  5,000 Units Subcutaneous Q8H   levETIRAcetam  1,000 mg Per Tube BID   multivitamin with minerals  1 tablet  Per Tube Daily   phenobarbital  97.2 mg Per Tube Q8H   Followed by   [START ON 12/25/2022] phenobarbital  64.8 mg Per Tube Q8H   Followed by   [START ON 12/27/2022] phenobarbital  32.4 mg Per Tube Q8H   polyethylene glycol  17 g Per Tube Daily   potassium chloride  60 mEq Per Tube Once   sodium chloride flush  3 mL Intravenous Q12H   thiamine  100 mg Per Tube Daily    PRN meds: docusate sodium, hydrALAZINE, LORazepam, LORazepam, mouth rinse, polyethylene glycol   Infusions:   ampicillin-sulbactam (UNASYN) IV 3 g (12/24/22 0452)   feeding supplement (OSMOLITE 1.5 CAL) 25 mL/hr at 12/24/22 0037   magnesium sulfate bolus IVPB      Antimicrobials: Anti-infectives (From admission, onward)    Start     Dose/Rate Route Frequency Ordered Stop   12/21/22 2115  Ampicillin-Sulbactam (UNASYN) 3 g in sodium chloride 0.9 % 100 mL IVPB        3 g 200 mL/hr over 30 Minutes Intravenous Every 8 hours 12/21/22 2016 12/24/22 2114       Objective: Vitals:   12/24/22 0901 12/24/22 1249  BP: (!) 189/93 (!) 181/89  Pulse: 80 66  Resp: 19 18  Temp: 97.8 F (36.6 C) (!) 97.4 F (36.3 C)  SpO2: 100% 100%    Intake/Output Summary (Last 24 hours) at 12/24/2022 1304 Last data filed at 12/24/2022 0856 Gross per 24 hour  Intake 1252.25 ml  Output 400 ml  Net 852.25 ml   Filed Weights   12/21/22 1927 12/22/22 0606 12/23/22 0500  Weight: 75 kg 82.5 kg 82.4 kg   Weight change:  Body mass index is 25.34 kg/m.   Physical Exam: General exam: Elderly African-American male. not in physical distress Skin: No rashes, lesions or ulcers. HEENT: Atraumatic, normocephalic, no obvious bleeding.  NG tube in place. Lungs: Diminished air entry in both bases CVS: Regular rate and rhythm, no murmur GI/Abd soft, nontender, nondistended, bowel sound present CNS: Alert, awake, slow to respond mumbles to answers..  Gently tried to lift her arms but is very weak Psychiatry: Sad affect Extremities: No pedal  edema, no calf tenderness  Data Review: I have personally reviewed the laboratory data and studies available.  F/u labs ordered Unresulted Labs (From admission, onward)     Start     Ordered   12/24/22 0500  Magnesium  (ICU Tube Feeding: PEPuP )  5A & 5P,   R (with TIMED occurrences)     Question:  Specimen collection method  Answer:  Lab=Lab collect   12/23/22 1555   12/24/22 0500  Phosphorus  (ICU Tube Feeding: PEPuP )  5A & 5P,   R (with TIMED occurrences)     Question:  Specimen collection method  Answer:  Lab=Lab collect   12/23/22 1555   12/22/22 0813  Rapid urine drug screen (hospital performed)  Add-on,   AD        12/22/22 2130  Total time spent in review of labs and imaging, patient evaluation, formulation of plan, documentation and communication with family: 55 minutes  Signed, Lorin Glass, MD [facility] 12/24/2022

## 2022-12-24 NOTE — Plan of Care (Signed)
  Problem: Clinical Measurements: Goal: Will remain free from infection Outcome: Progressing Goal: Diagnostic test results will improve Outcome: Progressing   Problem: Nutrition: Goal: Adequate nutrition will be maintained Outcome: Progressing   Problem: Elimination: Goal: Will not experience complications related to urinary retention Outcome: Progressing   Problem: Safety: Goal: Ability to remain free from injury will improve Outcome: Progressing

## 2022-12-24 NOTE — Evaluation (Signed)
Clinical/Bedside Swallow Evaluation Patient Details  Name: Christophen Levandowski MRN: [id number] Date of Birth: 1950/12/27  Today's Date: 12/24/2022 Time: SLP Start Time (ACUTE ONLY): 1523 SLP Stop Time (ACUTE ONLY): 1540 SLP Time Calculation (min) (ACUTE ONLY): 17 min  Past Medical History:  Past Medical History:  Diagnosis Date   Hypertension    Seizure (HCC)    Past Surgical History: No pertinent past surgical history listed in chart.   HPI:  GEOF PIMENTA is a 72 yo male presenting to ED 10/28 s/p fall with seizure at home. On ground ~3 hours prior to call for EMS, unresponsive. CIWA due to ETOH abuse. ETT 10/28-10/29. Cortrak placed 10/30. PMH seizures on Keppra, HTN, L MCA CVA    Assessment / Plan / Recommendation  Clinical Impression  Pt awake and alert, oriented to self and time. Oral motor exam limited due to pt's difficulty following commands, although appears grossly WFL. Note missing dentition, although pt reports he typically eats a diet consisting of regular textures. Prior to PO trials, pt's vocal quality appears wet and hoarse, which he subjectively reports is different than baseline. No s/s of aspiration noted throughout all trials. With solids, pt had prolonged oral transit and mild oral residue; although extent of residue difficult to assess due to pt's difficulty following commands. Given pt's mentation, recommend upgrading diet to Dys 1 texture solids and thin liquids. SLP will continue following to assess readiness to upgrade diet as clinically indicated. SLP Visit Diagnosis: Dysphagia, unspecified (R13.10)    Aspiration Risk  Mild aspiration risk    Diet Recommendation Dysphagia 1 (Puree);Thin liquid    Liquid Administration via: Cup;Straw Medication Administration: Crushed with puree Supervision: Staff to assist with self feeding;Full supervision/cueing for compensatory strategies Compensations: Minimize environmental distractions;Slow rate;Small  sips/bites Postural Changes: Seated upright at 90 degrees    Other  Recommendations Oral Care Recommendations: Oral care BID;Staff/trained caregiver to provide oral care    Recommendations for follow up therapy are one component of a multi-disciplinary discharge planning process, led by the attending physician.  Recommendations may be updated based on patient status, additional functional criteria and insurance authorization.  Follow up Recommendations Skilled nursing-short term rehab (<3 hours/day)      Assistance Recommended at Discharge    Functional Status Assessment Patient has had a recent decline in their functional status and demonstrates the ability to make significant improvements in function in a reasonable and predictable amount of time.  Frequency and Duration min 2x/week  1 week       Prognosis Prognosis for improved oropharyngeal function: Good Barriers to Reach Goals: Cognitive deficits;Time post onset      Swallow Study   General HPI: WILBON HAMMOND is a 72 yo male presenting to ED 10/28 s/p fall with seizure at home. On ground ~3 hours prior to call for EMS, unresponsive. CIWA due to ETOH abuse. ETT 10/28-10/29. Cortrak placed 10/30. PMH seizures on Keppra, HTN, L MCA CVA Type of Study: Bedside Swallow Evaluation Previous Swallow Assessment: none in chart Diet Prior to this Study: Clear liquid diet Temperature Spikes Noted: No Respiratory Status: Room air History of Recent Intubation: Yes Total duration of intubation (days): 1 days Date extubated: 12/22/22 Behavior/Cognition: Alert;Cooperative;Requires cueing Oral Cavity Assessment: Within Functional Limits Oral Care Completed by SLP: No Oral Cavity - Dentition: Poor condition;Missing dentition Vision: Functional for self-feeding Self-Feeding Abilities: Total assist Patient Positioning: Upright in bed Baseline Vocal Quality: Hoarse;Wet Volitional Cough: Congested Volitional Swallow: Unable to elicit  Oral/Motor/Sensory Function Overall Oral Motor/Sensory Function: Within functional limits   Ice Chips Ice chips: Not tested   Thin Liquid Thin Liquid: Within functional limits Presentation: Straw    Nectar Thick Nectar Thick Liquid: Not tested   Honey Thick Honey Thick Liquid: Not tested   Puree Puree: Within functional limits Presentation: Spoon   Solid     Solid: Impaired Oral Phase Functional Implications: Oral residue;Prolonged oral transit      Gwynneth Aliment, M.A., CF-SLP Speech Language Pathology, Acute Rehabilitation Services  Secure Chat preferred [phone]  12/24/2022,3:55 PM

## 2022-12-24 NOTE — Care Management Important Message (Signed)
Important Message  Patient Details  Name: John Spears MRN: [id number] Date of Birth: Jun 04, 1950   Important Message Given:  Yes - Medicare IM     Dorena Bodo 12/24/2022, 2:54 PM

## 2022-12-25 DIAGNOSIS — R569 Unspecified convulsions: Secondary | ICD-10-CM | POA: Diagnosis not present

## 2022-12-25 LAB — PHOSPHORUS
Phosphorus: 2 mg/dL — ABNORMAL LOW (ref 2.5–4.6)
Phosphorus: 1.5 mg/dL — ABNORMAL LOW (ref 2.5–4.6)

## 2022-12-25 LAB — GLUCOSE, CAPILLARY
Glucose-Capillary: 104 mg/dL — ABNORMAL HIGH (ref 70–99)
Glucose-Capillary: 112 mg/dL — ABNORMAL HIGH (ref 70–99)
Glucose-Capillary: 115 mg/dL — ABNORMAL HIGH (ref 70–99)
Glucose-Capillary: 116 mg/dL — ABNORMAL HIGH (ref 70–99)
Glucose-Capillary: 120 mg/dL — ABNORMAL HIGH (ref 70–99)
Glucose-Capillary: 148 mg/dL — ABNORMAL HIGH (ref 70–99)

## 2022-12-25 LAB — BASIC METABOLIC PANEL
Anion gap: 6 (ref 5–15)
BUN: 21 mg/dL (ref 8–23)
CO2: 23 mmol/L (ref 22–32)
Calcium: 9.2 mg/dL (ref 8.9–10.3)
Chloride: 109 mmol/L (ref 98–111)
Creatinine, Ser: 1.11 mg/dL (ref 0.61–1.24)
GFR, Estimated: >60 mL/min (ref >60)
Glucose, Bld: 113 mg/dL — ABNORMAL HIGH (ref 70–99)
Potassium: 3.8 mmol/L (ref 3.5–5.1)
Sodium: 138 mmol/L (ref 135–145)

## 2022-12-25 LAB — MAGNESIUM
Magnesium: 2.5 mg/dL — ABNORMAL HIGH (ref 1.7–2.4)
Magnesium: 2 mg/dL (ref 1.7–2.4)

## 2022-12-25 MED ORDER — ENSURE ENLIVE PO LIQD
237.0000 mL | Freq: Two times a day (BID) | ORAL | Status: DC
  Administered 2022-12-25 – 2022-12-30 (×9): 237 mL via ORAL

## 2022-12-25 MED ORDER — OSMOLITE 1.5 CAL PO LIQD
1000.0000 mL | ORAL | Status: DC
  Administered 2022-12-25 – 2022-12-26 (×2): 1000 mL
  Filled 2022-12-25 (×3): qty 1000

## 2022-12-25 MED ORDER — POTASSIUM PHOSPHATES 15 MMOLE/5ML IV SOLN
30.0000 mmol | Freq: Once | INTRAVENOUS | Status: AC
  Administered 2022-12-25: 30 mmol via INTRAVENOUS
  Filled 2022-12-25: qty 10

## 2022-12-25 MED ORDER — PROSOURCE TF20 ENFIT COMPATIBL EN LIQD
60.0000 mL | Freq: Every day | ENTERAL | Status: DC
  Administered 2022-12-25 – 2022-12-28 (×4): 60 mL
  Filled 2022-12-25 (×4): qty 60

## 2022-12-25 NOTE — Plan of Care (Signed)

## 2022-12-25 NOTE — Progress Notes (Signed)
PROGRESS NOTE  John Spears  DOB: March 13, 1950  PCP: Patient, No Pcp Per XBJ:[id number]  DOA: 12/21/2022  LOS: 4 days  Hospital Day: 5  Brief narrative: John Spears is a 72 y.o. male with PMH significant for HTN, prior left MCA stroke, seizure with questionable compliance to Keppra, chronic alcoholism drinks about 6-12 beers per day as well as an entire bottle of hard liquor. 10/28, patient was brought to the ED after a witnessed GTCS leading to fall.  Per report, family left him on the ground for about 3 hours to see if he would recover.  He was subsequently able to stand up and then fell again and seized again. EMS found him unresponsive with blood pressure elevated to 40s.  En route to the ED, he had another episode of seizure.  In the ED he was postictal and unable to protect airway requiring intubation and mechanical ventilation. On exam, he was also noted to have left gaze preference and right-sided weakness consistent with postictal Todd's phenomena. Neurology started him on Keppra. Labs with lactic acid significantly elevated to 12.5, blood alcohol level mildly elevated to 11. CT head and cervical spine did not show any intracranial injury or cervical fracture/dislocation.  It showed a remote left posterior MCA infarct. CT chest/abdomen/pelvis showed right lower lobe consolidation raising concern of infection. He was admitted to ICU, started on IV Unasyn, CIWA protocol  His clinical status gradually improved, extubated next day on 10/29.  Transferred out to [facility] on 10/31.  Subjective: Patient was seen and examined this morning.   Elderly African-American male.  Propped up in bed.   Speech therapy evaluation was going on.   Remains weak.  Only mumbles to conversation. NG tube feeding ongoing. Family not at bedside.  Assessment and plan: GTCS In the setting of chronic heavy alcoholism, suspect noncompliance to Keppra. Neurology has restarted on Keppra 1000 mg  twice daily Seizure precautions  Acute metabolic encephalopathy Todd's paresis Altered mentation due to postictal status.  Initial left gaze preference and right-sided weakness due to Todd's paresis. He seems weak and is mumbling to questions.  Continue to monitor mental status.  Acute respiratory failure with hypoxia Initially unable to protect airway because of low GCS.  Required intubation for about 24 hours.  Successfully extubated.  Currently on room air  Acute alcohol withdrawal symptoms Chronic alcohol abuse Currently on CIWA protocol with phenobarbital taper Continue multivitamins  Dysphagia Moderate malnutrition Dietitian and speech therapy consult obtained.  Currently on NG tube feeding.  Dysphagia 1 diet started as well.   Mobility: Needs PT eval once mental status improves  Goals of care   Code Status: Full Code     DVT prophylaxis:  heparin injection 5,000 Units Start: 12/21/22 2200 SCDs Start: 12/21/22 1933   Antimicrobials: IV Unasyn completed on 10/31. Fluid: None Consultants: None Family Communication: None at bedside  Status: Inpatient Level of care:  Progressive   Patient is from: Home Needs to continue in-hospital care: Mental status remains altered.  Remains on tube feeding Anticipated d/c to: Pending clinical course      Diet:  Diet Order             DIET - DYS 1 Room service appropriate? No; Fluid consistency: Thin  Diet effective now                   Scheduled Meds:  carvedilol  25 mg Per Tube BID WC   Chlorhexidine Gluconate Cloth  6  each Topical Daily   docusate  100 mg Per Tube BID   feeding supplement  237 mL Oral BID BM   feeding supplement (OSMOLITE 1.5 CAL)  1,000 mL Per Tube Q24H   feeding supplement (PROSource TF20)  60 mL Per Tube Daily   free water  200 mL Per Tube Q6H   heparin  5,000 Units Subcutaneous Q8H   levETIRAcetam  1,000 mg Per Tube BID   multivitamin with minerals  1 tablet Per Tube Daily    phenobarbital  64.8 mg Per Tube Q8H   Followed by   [START ON 12/27/2022] phenobarbital  32.4 mg Per Tube Q8H   polyethylene glycol  17 g Per Tube Daily   sodium chloride flush  3 mL Intravenous Q12H   thiamine  100 mg Per Tube Daily    PRN meds: docusate sodium, hydrALAZINE, LORazepam, LORazepam, mouth rinse, polyethylene glycol   Infusions:     Antimicrobials: Anti-infectives (From admission, onward)    Start     Dose/Rate Route Frequency Ordered Stop   12/21/22 2115  Ampicillin-Sulbactam (UNASYN) 3 g in sodium chloride 0.9 % 100 mL IVPB        3 g 200 mL/hr over 30 Minutes Intravenous Every 8 hours 12/21/22 2016 12/24/22 1345       Objective: Vitals:   12/25/22 0825 12/25/22 1150  BP: (!) 155/89 (!) 178/80  Pulse: 69 71  Resp: 18 19  Temp: 98.4 F (36.9 C) 98.9 F (37.2 C)  SpO2: 100% 100%    Intake/Output Summary (Last 24 hours) at 12/25/2022 1325 Last data filed at 12/25/2022 1300 Gross per 24 hour  Intake 1548.53 ml  Output 425 ml  Net 1123.53 ml   Filed Weights   12/22/22 0606 12/23/22 0500 12/25/22 0605  Weight: 82.5 kg 82.4 kg 87.7 kg   Weight change:  Body mass index is 26.97 kg/m.   Physical Exam: General exam: Elderly African-American male.  Not in pain. Skin: No rashes, lesions or ulcers. HEENT: Atraumatic, normocephalic, no obvious bleeding.  NG tube in place. Lungs: Diminished air entry in both bases CVS: Regular rate and rhythm, no murmur GI/Abd soft, nontender, nondistended, bowel sound present CNS: Alert, awake, slow to respond mumbles to answers.Dorthy Cooler only psychiatry: Sad affect Extremities: No pedal edema, no calf tenderness  Data Review: I have personally reviewed the laboratory data and studies available.  F/u labs ordered Unresulted Labs (From admission, onward)     Start     Ordered   12/24/22 0500  Magnesium  (ICU Tube Feeding: PEPuP )  5A & 5P,   R     Question:  Specimen collection method  Answer:  Lab=Lab collect    12/23/22 1555   12/24/22 0500  Phosphorus  (ICU Tube Feeding: PEPuP )  5A & 5P,   R     Question:  Specimen collection method  Answer:  Lab=Lab collect   12/23/22 1555   12/22/22 0813  Rapid urine drug screen (hospital performed)  Add-on,   AD        12/22/22 0812              Total time spent in review of labs and imaging, patient evaluation, formulation of plan, documentation and communication with family: 45 minutes  Signed, Lorin Glass, MD [facility] 12/25/2022

## 2022-12-25 NOTE — Plan of Care (Signed)
  Problem: Safety: Goal: Non-violent Restraint(s) Outcome: Completed/Met   

## 2022-12-25 NOTE — Progress Notes (Signed)
Nutrition Follow-up  DOCUMENTATION CODES:   Non-severe (moderate) malnutrition in context of social or environmental circumstances  INTERVENTION:  Transition to Nocturnal tube feeding via Cortrak tube: - Osmolite 1.5 @ 85 ml/h  x 12 hours from 1800-0600 (1020 ml) - Prosource TF20 60 ml daily   Provides 1610 kcal, 84 gm protein, 777 ml free water daily   200 ml free water every 6 hours Total free water: 1577 ml    - Provide Ensure Enlive BID, each supplement provides 350 kcal and 20 grams of protein.  - Diet per SLP  - Continue 100 mg thiamine daily x 5 days  - Continue MVI with minerals daily  NUTRITION DIAGNOSIS:   Moderate Malnutrition related to social / environmental circumstances as evidenced by moderate fat depletion, moderate muscle depletion.  - Ongoing but being addressed by TF, diet advancement and nutritional supplements  GOAL:   Patient will meet greater than or equal to 90% of their needs  Meeting  MONITOR:   TF tolerance  REASON FOR ASSESSMENT:   Consult Assessment of nutrition requirement/status, Enteral/tube feeding initiation and management  ASSESSMENT:   Pt with PMH of sz and ETOH use admitted with seizures.  10/29 - extubated 10/30 - s/p cortrak placement; tip gastric per xray 10/31 - Advanced to DYS 1 diet.   Spoke with pt on visit he was hard to understand at times but can answer questions with a short answer. PTA pt would usually only eat snacks throughout the day such as chips, cookies, crackers etc, drinks 5-6 beer/day and denies any recent wt loss.  SLP advanced diet to DYS 1 last night. On observation pt ate 75% of his breakfast. Will trial 12 hour nocturnal feeds and add Ensure Enlive BID due to pt's increased PO intake. Will monitor for nutritional adequacy by PO going forward.    Pt states no nausea or vomiting. Pt's weight has increased 11.5 lbs, this may be due to edema and initiation of tube feeds.   Potassium and magnesium  low, being addressed by additional supplementation per MD.   Admit weight: 82.5 kg  Current weight: 87.7 kg    Average Meal Intake: 11/1: 75% Breakfast, observation   Nutritionally Relevant Medications: Scheduled Meds:  carvedilol  25 mg Per Tube BID WC   Chlorhexidine Gluconate Cloth  6 each Topical Daily   docusate  100 mg Per Tube BID   feeding supplement  237 mL Oral BID BM   feeding supplement (OSMOLITE 1.5 CAL)  1,000 mL Per Tube Q24H   feeding supplement (PROSource TF20)  60 mL Per Tube Daily   free water  200 mL Per Tube Q6H   heparin  5,000 Units Subcutaneous Q8H   levETIRAcetam  1,000 mg Per Tube BID   multivitamin with minerals  1 tablet Per Tube Daily   phenobarbital  64.8 mg Per Tube Q8H   Followed by   Melene Muller ON 12/27/2022] phenobarbital  32.4 mg Per Tube Q8H   polyethylene glycol  17 g Per Tube Daily   sodium chloride flush  3 mL Intravenous Q12H   thiamine  100 mg Per Tube Daily   Labs Reviewed: Potassium 3.4 Phosphorus 2 Mg 2.5  Calcium 8.8 CBG ranges from 81-148 mg/dL over the last 24 hours  Diet Order:   Diet Order             DIET - DYS 1 Room service appropriate? No; Fluid consistency: Thin  Diet effective now  EDUCATION NEEDS:   Not appropriate for education at this time  Skin:  Skin Assessment: Reviewed RN Assessment Skin Integrity Issues:: Other (Comment) Other: LUE non pitting edema  Last BM:  10/31: Type 6  Height:   Ht Readings from Last 1 Encounters:  12/21/22 5\' 11"  (1.803 m)    Weight:   Wt Readings from Last 1 Encounters:  12/25/22 87.7 kg    BMI:  Body mass index is 26.97 kg/m.  Estimated Nutritional Needs:   Kcal:  2200-2400 kcals  Protein:  115-130g  Fluid:  >2 L/day  Elliot Dally, RD Registered Dietitian  See Amion for more information

## 2022-12-25 NOTE — Plan of Care (Signed)
  Problem: Clinical Measurements: Goal: Ability to maintain clinical measurements within normal limits will improve Outcome: Progressing Goal: Will remain free from infection Outcome: Progressing   Problem: Nutrition: Goal: Adequate nutrition will be maintained Outcome: Progressing   Problem: Elimination: Goal: Will not experience complications related to bowel motility Outcome: Progressing Goal: Will not experience complications related to urinary retention Outcome: Progressing   Problem: Skin Integrity: Goal: Risk for impaired skin integrity will decrease Outcome: Progressing

## 2022-12-25 NOTE — Progress Notes (Signed)
Speech Language Pathology Treatment: Dysphagia  Patient Details Name: John Spears MRN: [id number] DOB: 17-Jul-1950 Today's Date: 12/25/2022 Time: 2956-2130 SLP Time Calculation (min) (ACUTE ONLY): 10 min  Assessment / Plan / Recommendation Clinical Impression  Pt with increased agitation this date which greatly affected his sustained attention to PO trials. Pt declining to don dentures and states he does not typically wear them when eating. Trials of thin liquids without s/s of aspiration. He initially refused regular texture solids, although eventually agreed to take one small bite with prolonged bolus formation and decreased awareness. Pt quickly fell asleep after swallowing. Recommend continuing current diet. Suspect mentation remains most significant barrier to diet advancement and adequate oral intake. Will continue to f/u.    HPI HPI: John Spears is a 72 yo male presenting to ED 10/28 s/p fall with seizure at home. On ground ~3 hours prior to call for EMS, unresponsive. CIWA due to ETOH abuse. ETT 10/28-10/29. Cortrak placed 10/30. PMH seizures on Keppra, HTN, L MCA CVA      SLP Plan  Continue with current plan of care      Recommendations for follow up therapy are one component of a multi-disciplinary discharge planning process, led by the attending physician.  Recommendations may be updated based on patient status, additional functional criteria and insurance authorization.    Recommendations  Diet recommendations: Dysphagia 1 (puree);Thin liquid Liquids provided via: Cup;Straw Medication Administration: Crushed with puree Supervision: Staff to assist with self feeding;Full supervision/cueing for compensatory strategies Compensations: Minimize environmental distractions;Slow rate;Small sips/bites Postural Changes and/or Swallow Maneuvers: Seated upright 90 degrees                  Oral care BID;Staff/trained caregiver to provide oral care   Frequent or constant  Supervision/Assistance Dysphagia, unspecified (R13.10)     Continue with current plan of care     Gwynneth Aliment, M.A., CF-SLP Speech Language Pathology, Acute Rehabilitation Services  Secure Chat preferred [phone]   12/25/2022, 11:18 AM

## 2022-12-25 NOTE — Progress Notes (Addendum)
Physical Therapy Treatment Patient Details Name: John Spears MRN: [id number] DOB: 1951/01/28 Today's Date: 12/25/2022   History of Present Illness 72 yo male s/p fall 10/28 with seizure at home. On the ground ~3 hours prior to call for EMS, unresponsive, SBP 240. CIWA due to ETOH abuse. ETT 10/28-10/29. PMH: seizures on keppra, HTN, L MCA CVA    PT Comments  Pt was less impulsive and resistive this date. He demonstrated good initiation when cued to sit up EOB, only needing minA to do so. He was restless with his hand placement during transfers though, and needed repeated cues to keep them in contact with either PT or stedy. He was able to stand 3x today. He required modA to transfer to stand when provided HHA and when he pushed up with his R UE rather than pulling up on the stedy. When pt pulled up on the stedy with bil arms he was able to transfer to stand with as little as minA. He had a lot of difficulty trying to take steps to pivot from bed to chair with HHA today. He remains at high risk for falls. Will continue to follow acutely. At this time, recommending nursing utilize the stedy to transfer pt.     If plan is discharge home, recommend the following: Two people to help with walking and/or transfers;Two people to help with bathing/dressing/bathroom;Assistance with cooking/housework;Direct supervision/assist for financial management;Direct supervision/assist for medications management;Assist for transportation;Help with stairs or ramp for entrance   Can travel by private vehicle     No  Equipment Recommendations  Other (comment) (TBA)    Recommendations for Other Services       Precautions / Restrictions Precautions Precautions: Fall;Other (comment) Precaution Comments: cortrak, restless/impulsive Restrictions Weight Bearing Restrictions: No     Mobility  Bed Mobility Overal bed mobility: Needs Assistance Bed Mobility: Supine to Sit     Supine to sit: HOB elevated,  Min assist     General bed mobility comments: Pt initiated bringing legs off EOB and ascending trunk when cued today but needed minA at trunk and hips to scoot to EOB    Transfers Overall transfer level: Needs assistance Equipment used: 1 person hand held assist, Ambulation equipment used Transfers: Sit to/from Stand, Bed to chair/wheelchair/BSC Sit to Stand: Mod assist, Min assist   Step pivot transfers: Max assist       General transfer comment: Pt cued to hold onto therapist anterior to him for first transfer OOB but pt restless and moving hands in air often. ModA for balance coming to stand from EOB, maxA for balance, shifting weight, and advancing feet to step pivot to L to chair. ModA for first stand to stedy from recliner with pt pushing up on chair with R UE rather than pulling up on stedy with bil UEs. When pt did pull up with bil UEs with the second bout from chair to stedy he only needed minA.    Ambulation/Gait Ambulation/Gait assistance: Max assist Gait Distance (Feet): 1 Feet Assistive device: 1 person hand held assist Gait Pattern/deviations: Step-to pattern, Decreased step length - right, Decreased step length - left, Decreased stride length, Decreased weight shift to right, Decreased weight shift to left, Trunk flexed Gait velocity: reduced Gait velocity interpretation: <1.31 ft/sec, indicative of household ambulator   General Gait Details: Pt needed maxA for balance, weight shifting, and advancing feet to step pivot to L bed > recliner with HHA on therapist anterior to him. Pt maintains a flexed posture  Stairs             Wheelchair Mobility     Tilt Bed    Modified Rankin (Stroke Patients Only) Modified Rankin (Stroke Patients Only) Pre-Morbid Rankin Score: No symptoms Modified Rankin: Severe disability     Balance Overall balance assessment: Needs assistance Sitting-balance support: Feet supported, Bilateral upper extremity supported Sitting  balance-Leahy Scale: Poor Sitting balance - Comments: UE support and CGA-minA to sit statically   Standing balance support: Bilateral upper extremity supported, During functional activity, Reliant on assistive device for balance Standing balance-Leahy Scale: Poor Standing balance comment: Reliant on UE support and external physical assistance                            Cognition Arousal: Alert Behavior During Therapy: Flat affect, Restless Overall Cognitive Status: Impaired/Different from baseline Area of Impairment: Attention, Memory, Following commands, Safety/judgement, Awareness, Problem solving                   Current Attention Level: Focused Memory: Decreased short-term memory, Decreased recall of precautions Following Commands: Follows one step commands inconsistently, Follows one step commands with increased time Safety/Judgement: Decreased awareness of safety, Decreased awareness of deficits Awareness: Intellectual Problem Solving: Slow processing, Decreased initiation, Difficulty sequencing, Requires verbal cues, Requires tactile cues General Comments: Pt less resistive and impulsive today and initiating movements when cued. However, pt needing repeated reminders to keep hands on therapist or stedy when transferring for his safety. Slow to process and initiate. Mumbles and difficult to understand        Exercises      General Comments        Pertinent Vitals/Pain Pain Assessment Pain Assessment: Faces Faces Pain Scale: Hurts a little bit Pain Location: generalized Pain Descriptors / Indicators: Grimacing Pain Intervention(s): Limited activity within patient's tolerance, Monitored during session, Repositioned    Home Living                          Prior Function            PT Goals (current goals can now be found in the care plan section) Acute Rehab PT Goals Patient Stated Goal: did not state PT Goal Formulation: With  patient Time For Goal Achievement: 01/06/23 Potential to Achieve Goals: Fair Progress towards PT goals: Progressing toward goals    Frequency    Min 1X/week      PT Plan      Co-evaluation              AM-PAC PT "6 Clicks" Mobility   Outcome Measure  Help needed turning from your back to your side while in a flat bed without using bedrails?: A Little Help needed moving from lying on your back to sitting on the side of a flat bed without using bedrails?: A Little Help needed moving to and from a bed to a chair (including a wheelchair)?: A Lot Help needed standing up from a chair using your arms (e.g., wheelchair or bedside chair)?: A Lot Help needed to walk in hospital room?: Total Help needed climbing 3-5 steps with a railing? : Total 6 Click Score: 12    End of Session Equipment Utilized During Treatment: Gait belt Activity Tolerance: Patient tolerated treatment well Patient left: with call bell/phone within reach;with nursing/sitter in room;in chair;with chair alarm set;Other (comment) (RN deciding whether or not to apply restraints in room  with pt end of session) Nurse Communication: Mobility status;Need for lift equipment (stedy) PT Visit Diagnosis: Unsteadiness on feet (R26.81);Muscle weakness (generalized) (M62.81);Difficulty in walking, not elsewhere classified (R26.2);Other symptoms and signs involving the nervous system (R29.898);Other abnormalities of gait and mobility (R26.89)     Time: 1324-1340 PT Time Calculation (min) (ACUTE ONLY): 16 min  Charges:    $Therapeutic Activity: 8-22 mins PT General Charges $$ ACUTE PT VISIT: 1 Visit                     Virgil Benedict, PT, DPT Acute Rehabilitation Services  Office: [phone]    Bettina Gavia 12/25/2022, 5:54 PM

## 2022-12-26 ENCOUNTER — Inpatient Hospital Stay (HOSPITAL_COMMUNITY): Payer: 59

## 2022-12-26 DIAGNOSIS — R569 Unspecified convulsions: Secondary | ICD-10-CM | POA: Diagnosis not present

## 2022-12-26 LAB — GLUCOSE, CAPILLARY
Glucose-Capillary: 109 mg/dL — ABNORMAL HIGH (ref 70–99)
Glucose-Capillary: 75 mg/dL (ref 70–99)
Glucose-Capillary: 105 mg/dL — ABNORMAL HIGH (ref 70–99)
Glucose-Capillary: 117 mg/dL — ABNORMAL HIGH (ref 70–99)
Glucose-Capillary: 133 mg/dL — ABNORMAL HIGH (ref 70–99)
Glucose-Capillary: 89 mg/dL (ref 70–99)

## 2022-12-26 LAB — CULTURE, BLOOD (ROUTINE X 2)
Culture: NO GROWTH
Culture: NO GROWTH

## 2022-12-26 LAB — CBC
HCT: 26.7 % — ABNORMAL LOW (ref 39.0–52.0)
Hemoglobin: 8.9 g/dL — ABNORMAL LOW (ref 13.0–17.0)
MCH: 30.5 pg (ref 26.0–34.0)
MCHC: 33.3 g/dL (ref 30.0–36.0)
MCV: 91.4 fL (ref 80.0–100.0)
Platelets: 101 10*3/uL — ABNORMAL LOW (ref 150–400)
RBC: 2.92 MIL/uL — ABNORMAL LOW (ref 4.22–5.81)
RDW: 14.5 % (ref 11.5–15.5)
WBC: 6.5 10*3/uL (ref 4.0–10.5)
nRBC: 0 % (ref 0.0–0.2)

## 2022-12-26 LAB — BASIC METABOLIC PANEL
Anion gap: 6 (ref 5–15)
BUN: 19 mg/dL (ref 8–23)
CO2: 25 mmol/L (ref 22–32)
Calcium: 8.5 mg/dL — ABNORMAL LOW (ref 8.9–10.3)
Chloride: 105 mmol/L (ref 98–111)
Creatinine, Ser: 1.01 mg/dL (ref 0.61–1.24)
GFR, Estimated: >60 mL/min (ref >60)
Glucose, Bld: 121 mg/dL — ABNORMAL HIGH (ref 70–99)
Potassium: 3.7 mmol/L (ref 3.5–5.1)
Sodium: 136 mmol/L (ref 135–145)

## 2022-12-26 LAB — PHOSPHORUS: Phosphorus: 2.5 mg/dL (ref 2.5–4.6)

## 2022-12-26 LAB — MAGNESIUM: Magnesium: 1.8 mg/dL (ref 1.7–2.4)

## 2022-12-26 MED ORDER — ADULT MULTIVITAMIN W/MINERALS CH
1.0000 | ORAL_TABLET | Freq: Every day | ORAL | Status: DC
  Administered 2022-12-27 – 2022-12-30 (×4): 1 via ORAL
  Filled 2022-12-26 (×4): qty 1

## 2022-12-26 MED ORDER — THIAMINE MONONITRATE 100 MG PO TABS
100.0000 mg | ORAL_TABLET | Freq: Every day | ORAL | Status: AC
  Administered 2022-12-27 – 2022-12-30 (×4): 100 mg via ORAL
  Filled 2022-12-26 (×4): qty 1

## 2022-12-26 MED ORDER — CARVEDILOL 12.5 MG PO TABS
25.0000 mg | ORAL_TABLET | Freq: Two times a day (BID) | ORAL | Status: DC
  Administered 2022-12-26 – 2022-12-30 (×9): 25 mg via ORAL
  Filled 2022-12-26 (×9): qty 2

## 2022-12-26 MED ORDER — POLYETHYLENE GLYCOL 3350 17 G PO PACK
17.0000 g | PACK | Freq: Every day | ORAL | Status: DC
  Administered 2022-12-27: 17 g via ORAL
  Filled 2022-12-26: qty 1

## 2022-12-26 MED ORDER — DOCUSATE SODIUM 50 MG/5ML PO LIQD
100.0000 mg | Freq: Two times a day (BID) | ORAL | Status: DC
  Administered 2022-12-26 – 2022-12-27 (×2): 100 mg via ORAL
  Filled 2022-12-26 (×2): qty 10

## 2022-12-26 MED ORDER — LEVETIRACETAM 500 MG PO TABS
1000.0000 mg | ORAL_TABLET | Freq: Two times a day (BID) | ORAL | Status: DC
  Administered 2022-12-26 – 2022-12-30 (×9): 1000 mg via ORAL
  Filled 2022-12-26 (×9): qty 2

## 2022-12-26 NOTE — Plan of Care (Signed)

## 2022-12-26 NOTE — Progress Notes (Signed)
   12/26/22 0709  What Happened  Was fall witnessed? No  Was patient injured? Unsure (CT scan will tell)  Patient found on floor  Found by Staff-comment Fanny Bien RN)  Stated prior activity other (comment) (He was sleeping)  Provider Notification  Provider Name/Title Dr. Pola Corn  Date Provider Notified 12/26/22  Time Provider Notified [phone] (Not exactly sure)  Method of Notification Face-to-face;Page  Notification Reason Fall  Provider response At bedside;See new orders  Date of Provider Response 12/26/22  Time of Provider Response 0730  Adult Fall Risk Assessment  Risk Factor Category (scoring not indicated) High fall risk per protocol (document High fall risk)  Patient Fall Risk Level High fall risk  Adult Fall Risk Interventions  Required Bundle Interventions *See Row Information* High fall risk - low, moderate, and high requirements implemented  Additional Interventions PT/OT need assessed if change in mobility from baseline;Use of appropriate toileting equipment (bedpan, BSC, etc.)  Fall intervention(s) refused/Patient educated regarding refusal Bed alarm;Nonskid socks;Open door if unsupervised;Yellow bracelet;Supervision while toileting/edge of bed sitting  Screening for Fall Injury Risk (To be completed on HIGH fall risk patients) - Assessing Need for Floor Mats  Risk For Fall Injury- Criteria for Floor Mats Confusion/dementia (+NuDESC, CIWA, TBI, etc.);Noncompliant with safety precautions  Will Implement Floor Mats Yes  Vitals  Temp 99.1 F (37.3 C)  Temp Source Oral  BP (!) 179/96  MAP (mmHg) 118  BP Location Right Arm  BP Method Automatic  Patient Position (if appropriate) Lying  Pulse Rate 76  Oxygen Therapy  SpO2 97 %  O2 Device Room Air  Pain Assessment  Pain Scale PAINAD  PAINAD (Pain Assessment in Advanced Dementia)  Breathing 0  Negative Vocalization 0  Facial Expression 0  Body Language 0  Consolability 0  PAINAD Score 0  PCA/Epidural/Spinal  Assessment  Respiratory Pattern Regular;Unlabored  Neurological  Neuro (WDL) X  Level of Consciousness Alert  Orientation Level Oriented to person;Oriented to time;Disoriented to situation;Disoriented to place  Cognition Impulsive;Poor attention/concentration;Poor judgement;Poor safety awareness  Speech Slurred/Dysarthria  R Pupil Size (mm) 2  R Pupil Shape Round  R Pupil Reaction Brisk  L Pupil Size (mm) 2  L Pupil Shape Round  L Pupil Reaction Brisk  R Hand Grip Moderate  L Hand Grip Weak   R Foot Dorsiflexion Moderate  L Foot Dorsiflexion Weak  RUE Motor Response Purposeful movement  RUE Motor Strength 5  LUE Motor Response Purposeful movement  LUE Motor Strength 5  RLE Motor Response Purposeful movement  RLE Motor Strength 4  LLE Motor Response Purposeful movement  LLE Motor Strength 4  Neuro Symptoms Forgetful  Glasgow Coma Scale  Eye Opening 4  Best Verbal Response (NON-intubated) 4  Best Motor Response 6  Glasgow Coma Scale Score 14  Musculoskeletal  Musculoskeletal (WDL) X  Generalized Weakness Yes  Integumentary  Integumentary (WDL) X  Skin Condition Dry  Skin Integrity Ecchymosis  Ecchymosis Location Arm;Leg  Ecchymosis Location Orientation Bilateral   The incoming RN noted the patient lying in front of his bed  at shift change covered with incontinence stool. Dr. Pola Corn notified for CT scan and received a new order for it. Post fall assessment did not show any change form his baseline. Staff will continue to monitor.

## 2022-12-26 NOTE — Plan of Care (Signed)
  Problem: Clinical Measurements: Goal: Ability to maintain clinical measurements within normal limits will improve Outcome: Progressing Goal: Will remain free from infection Outcome: Progressing Goal: Respiratory complications will improve Outcome: Progressing Goal: Cardiovascular complication will be avoided Outcome: Progressing   Problem: Elimination: Goal: Will not experience complications related to urinary retention Outcome: Progressing   Problem: Pain Management: Goal: General experience of comfort will improve Outcome: Progressing   Problem: Skin Integrity: Goal: Risk for impaired skin integrity will decrease Outcome: Progressing   Problem: Education: Goal: Knowledge of General Education information will improve Description: Including pain rating scale, medication(s)/side effects and non-pharmacologic comfort measures Outcome: Not Progressing   Problem: Activity: Goal: Risk for activity intolerance will decrease Outcome: Not Progressing

## 2022-12-26 NOTE — Progress Notes (Signed)
PROGRESS NOTE  John Spears  DOB: Nov 09, 1950  PCP: Patient, No Pcp Per ZOX:[id number]  DOA: 12/21/2022  LOS: 5 days  Hospital Day: 6  Brief narrative: John Spears is a 72 y.o. male with PMH significant for HTN, prior left MCA stroke, seizure with questionable compliance to Keppra, chronic alcoholism drinks about 6-12 beers per day as well as an entire bottle of hard liquor. 10/28, patient was brought to the ED after a witnessed GTCS leading to fall.  Per report, family left him on the ground for about 3 hours to see if he would recover.  He was subsequently able to stand up and then fell again and seized again. EMS found him unresponsive with blood pressure elevated to 40s.  En route to the ED, he had another episode of seizure.  In the ED he was postictal and unable to protect airway requiring intubation and mechanical ventilation. On exam, he was also noted to have left gaze preference and right-sided weakness consistent with postictal Todd's phenomena. Neurology started him on Keppra. Labs with lactic acid significantly elevated to 12.5, blood alcohol level mildly elevated to 11. CT head and cervical spine did not show any intracranial injury or cervical fracture/dislocation.  It showed a remote left posterior MCA infarct. CT chest/abdomen/pelvis showed right lower lobe consolidation raising concern of infection. He was admitted to ICU, started on IV Unasyn, CIWA protocol  His clinical status gradually improved, extubated next day on 10/29.  Transferred out to [facility] on 10/31.  Subjective: Patient was seen and examined this morning.   Remains weak.  Opens eyes on command.  Very weak motor strength.  Remains on and he tube feeding Event from earlier this morning noted.  Patient had an unwitnessed fall.  Found on the floor.  Mental status change hard to assess CT scan of head pending  Assessment and plan: GTCS In the setting of chronic heavy alcoholism, suspect  noncompliance to Keppra. Neurology has restarted on Keppra 1000 mg twice daily Seizure precautions  Acute metabolic encephalopathy Todd's paresis Altered mentation due to postictal status.  Initial left gaze preference and right-sided weakness due to Todd's paresis. He seems weak and is mumbling to questions.  Continue to monitor mental status.  Acute respiratory failure with hypoxia Initially unable to protect airway because of low GCS.  Required intubation for about 24 hours.  Successfully extubated.  Currently on room air  Acute alcohol withdrawal symptoms Chronic alcohol abuse Currently on CIWA protocol with phenobarbital taper Continue multivitamins  Dysphagia Moderate malnutrition Dietitian and speech therapy consult obtained.  Currently on NG tube feeding.  Dysphagia 1 diet started as well.  Fall Patient had an unwitnessed fall.  Found on the floor.  Mental status change hard to assess CT scan of head pending   Mobility: Needs PT eval once mental status improves  Goals of care   Code Status: Full Code     DVT prophylaxis:  heparin injection 5,000 Units Start: 12/21/22 2200 SCDs Start: 12/21/22 1933   Antimicrobials: IV Unasyn completed on 10/31. Fluid: None Consultants: None Family Communication: None at bedside  Status: Inpatient Level of care:  Progressive   Patient is from: Home Needs to continue in-hospital care: Mental status remains altered.  Muscle strength remains weak.   Anticipated d/c to: Pending clinical course      Diet:  Diet Order             DIET - DYS 1 Room service appropriate? No; Fluid consistency:  Thin  Diet effective now                   Scheduled Meds:  carvedilol  25 mg Oral BID WC   Chlorhexidine Gluconate Cloth  6 each Topical Daily   docusate  100 mg Oral BID   feeding supplement  237 mL Oral BID BM   feeding supplement (OSMOLITE 1.5 CAL)  1,000 mL Per Tube Q24H   feeding supplement (PROSource TF20)  60 mL Per  Tube Daily   free water  200 mL Per Tube Q6H   heparin  5,000 Units Subcutaneous Q8H   levETIRAcetam  1,000 mg Oral BID   [START ON 12/27/2022] multivitamin with minerals  1 tablet Oral Daily   phenobarbital  64.8 mg Per Tube Q8H   Followed by   [START ON 12/27/2022] phenobarbital  32.4 mg Per Tube Q8H   [START ON 12/27/2022] polyethylene glycol  17 g Oral Daily   sodium chloride flush  3 mL Intravenous Q12H   [START ON 12/27/2022] thiamine  100 mg Oral Daily    PRN meds: docusate sodium, hydrALAZINE, LORazepam, LORazepam, mouth rinse, polyethylene glycol   Infusions:     Antimicrobials: Anti-infectives (From admission, onward)    Start     Dose/Rate Route Frequency Ordered Stop   12/21/22 2115  Ampicillin-Sulbactam (UNASYN) 3 g in sodium chloride 0.9 % 100 mL IVPB        3 g 200 mL/hr over 30 Minutes Intravenous Every 8 hours 12/21/22 2016 12/24/22 1345       Objective: Vitals:   12/26/22 0709 12/26/22 0804  BP: (!) 179/96 (!) 164/78  Pulse: 76 66  Resp:    Temp: 99.1 F (37.3 C) 98.3 F (36.8 C)  SpO2: 97% 100%    Intake/Output Summary (Last 24 hours) at 12/26/2022 1136 Last data filed at 12/26/2022 0923 Gross per 24 hour  Intake 2991.19 ml  Output 550 ml  Net 2441.19 ml   Filed Weights   12/22/22 0606 12/23/22 0500 12/25/22 0605  Weight: 82.5 kg 82.4 kg 87.7 kg   Weight change:  Body mass index is 26.97 kg/m.   Physical Exam: General exam: Elderly African-American male.  Not in pain. Skin: No rashes, lesions or ulcers. HEENT: Atraumatic, normocephalic, no obvious bleeding.  NG tube in place.  No evidence of external head injury from fall Lungs: Diminished air entry in both bases CVS: Regular rate and rhythm, no murmur GI/Abd soft, nontender, nondistended, bowel sound present CNS: Alert, awake, slow to respond mumbles to answers.Dorthy Cooler only psychiatry: Sad affect Extremities: No pedal edema, no calf tenderness  Data Review: I have personally  reviewed the laboratory data and studies available.  F/u labs ordered Unresulted Labs (From admission, onward)     Start     Ordered   12/26/22 0929  CBC  ONCE - STAT,   STAT       Question:  Specimen collection method  Answer:  Lab=Lab collect   12/26/22 0928   12/26/22 0929  Basic metabolic panel  ONCE - STAT,   STAT       Question:  Specimen collection method  Answer:  Lab=Lab collect   12/26/22 0928   12/26/22 0929  Magnesium  ONCE - STAT,   STAT       Question:  Specimen collection method  Answer:  Lab=Lab collect   12/26/22 0928   12/26/22 0929  Phosphorus  ONCE - STAT,   STAT  Question:  Specimen collection method  Answer:  Lab=Lab collect   12/26/22 1610   12/22/22 0813  Rapid urine drug screen (hospital performed)  Add-on,   AD        12/22/22 0812              Total time spent in review of labs and imaging, patient evaluation, formulation of plan, documentation and communication with family: 45 minutes  Signed, Lorin Glass, MD [facility] 12/26/2022

## 2022-12-26 NOTE — Progress Notes (Signed)
Shanda Bumps RN will notify the family of  the fall.

## 2022-12-26 NOTE — Progress Notes (Signed)
Darrin Luis (sister) called and updated about patients fall.

## 2022-12-27 DIAGNOSIS — R569 Unspecified convulsions: Secondary | ICD-10-CM | POA: Diagnosis not present

## 2022-12-27 LAB — GLUCOSE, CAPILLARY
Glucose-Capillary: 137 mg/dL — ABNORMAL HIGH (ref 70–99)
Glucose-Capillary: 102 mg/dL — ABNORMAL HIGH (ref 70–99)
Glucose-Capillary: 120 mg/dL — ABNORMAL HIGH (ref 70–99)
Glucose-Capillary: 83 mg/dL (ref 70–99)
Glucose-Capillary: 96 mg/dL (ref 70–99)
Glucose-Capillary: 91 mg/dL (ref 70–99)

## 2022-12-27 NOTE — Plan of Care (Signed)
  Problem: Elimination: Goal: Will not experience complications related to urinary retention Outcome: Progressing   Problem: Pain Management: Goal: General experience of comfort will improve Outcome: Progressing   Problem: Safety: Goal: Ability to remain free from injury will improve Outcome: Progressing   Problem: Skin Integrity: Goal: Risk for impaired skin integrity will decrease Outcome: Progressing

## 2022-12-27 NOTE — Plan of Care (Signed)
  Problem: Clinical Measurements: Goal: Will remain free from infection Outcome: Progressing Goal: Cardiovascular complication will be avoided Outcome: Progressing   Problem: Activity: Goal: Risk for activity intolerance will decrease Outcome: Progressing   Problem: Nutrition: Goal: Adequate nutrition will be maintained Outcome: Progressing   Problem: Coping: Goal: Level of anxiety will decrease Outcome: Progressing   Problem: Elimination: Goal: Will not experience complications related to bowel motility Outcome: Progressing Goal: Will not experience complications related to urinary retention Outcome: Progressing   Problem: Pain Management: Goal: General experience of comfort will improve Outcome: Progressing   Problem: Safety: Goal: Ability to remain free from injury will improve Outcome: Progressing

## 2022-12-27 NOTE — Progress Notes (Signed)
PROGRESS NOTE  John Spears  DOB: Jun 16, 1950  PCP: Patient, No Pcp Per [id number]  DOA: 12/21/2022  LOS: 6 days  Hospital Day: 7  Brief narrative: John Spears is a 72 y.o. male with PMH significant for HTN, prior left MCA stroke, seizure with questionable compliance to Keppra, chronic alcoholism drinks about 6-12 beers per day as well as an entire bottle of hard liquor. 10/28, patient was brought to the ED after a witnessed GTCS leading to fall.  Per report, family left him on the ground for about 3 hours to see if he would recover.  He was subsequently able to stand up and then fell again and seized again. EMS found him unresponsive with blood pressure elevated to 40s.  En route to the ED, he had another episode of seizure.  In the ED he was postictal and unable to protect airway requiring intubation and mechanical ventilation. On exam, he was also noted to have left gaze preference and right-sided weakness consistent with postictal Todd's phenomena. Neurology started him on Keppra. Labs with lactic acid significantly elevated to 12.5, blood alcohol level mildly elevated to 11. CT head and cervical spine did not show any intracranial injury or cervical fracture/dislocation.  It showed a remote left posterior MCA infarct. CT chest/abdomen/pelvis showed right lower lobe consolidation raising concern of infection. He was admitted to ICU, started on IV Unasyn, CIWA protocol  His clinical status gradually improved, extubated next day on 10/29.  Transferred out to [facility] on 10/31.  Subjective: Patient was seen and examined this morning.   Remains weak.  Mental status - still slow to respond On dysphagia diet.  Tube feeding on hold.  Assessment and plan: GTCS In the setting of chronic heavy alcoholism, suspect noncompliance to Keppra. Neurology has restarted on Keppra 1000 mg twice daily Seizure precautions  Acute metabolic encephalopathy Todd's paresis Altered  mentation due to postictal status.  Initial left gaze preference and right-sided weakness due to Todd's paresis. He seems weak and is mumbling to questions.  Continue to monitor mental status.  Acute respiratory failure with hypoxia Initially unable to protect airway because of low GCS.  Required intubation for about 24 hours.  Successfully extubated.  Currently on room air  Acute alcohol withdrawal symptoms Chronic alcohol abuse Currently on CIWA protocol.  I stopped phenobarbital tapering course today because of persistent altered mental status. Continue multivitamins  Dysphagia Moderate malnutrition Dietitian and speech therapy consult obtained.  Currently on NG tube feeding.  Dysphagia 1 diet started as well.  Fall Patient had an unwitnessed fall.  Found on the floor.  Mental status change hard to assess CT scan of head pending   Mobility: Needs PT eval once mental status improves  Goals of care   Code Status: Full Code     DVT prophylaxis:  heparin injection 5,000 Units Start: 12/21/22 2200 SCDs Start: 12/21/22 1933   Antimicrobials: IV Unasyn completed on 10/31. Fluid: None Consultants: None Family Communication: None at bedside  Status: Inpatient Level of care:  Progressive   Patient is from: Home Needs to continue in-hospital care: Mental status remains altered.  Muscle strength remains weak.  Stopped phenobarbital today. Anticipated d/c to: Pending clinical course  Diet:  Diet Order             DIET - DYS 1 Room service appropriate? No; Fluid consistency: Thin  Diet effective now  Scheduled Meds:  carvedilol  25 mg Oral BID WC   Chlorhexidine Gluconate Cloth  6 each Topical Daily   docusate  100 mg Oral BID   feeding supplement  237 mL Oral BID BM   feeding supplement (OSMOLITE 1.5 CAL)  1,000 mL Per Tube Q24H   feeding supplement (PROSource TF20)  60 mL Per Tube Daily   free water  200 mL Per Tube Q6H   heparin  5,000 Units  Subcutaneous Q8H   levETIRAcetam  1,000 mg Oral BID   multivitamin with minerals  1 tablet Oral Daily   phenobarbital  32.4 mg Per Tube Q8H   polyethylene glycol  17 g Oral Daily   sodium chloride flush  3 mL Intravenous Q12H   thiamine  100 mg Oral Daily    PRN meds: docusate sodium, hydrALAZINE, LORazepam, LORazepam, mouth rinse, polyethylene glycol   Infusions:     Antimicrobials: Anti-infectives (From admission, onward)    Start     Dose/Rate Route Frequency Ordered Stop   12/21/22 2115  Ampicillin-Sulbactam (UNASYN) 3 g in sodium chloride 0.9 % 100 mL IVPB        3 g 200 mL/hr over 30 Minutes Intravenous Every 8 hours 12/21/22 2016 12/24/22 1345       Objective: Vitals:   12/27/22 0843 12/27/22 1139  BP:  138/69  Pulse:  67  Resp:  18  Temp: 98.5 F (36.9 C) (!) 97.5 F (36.4 C)  SpO2:  100%    Intake/Output Summary (Last 24 hours) at 12/27/2022 1235 Last data filed at 12/27/2022 1030 Gross per 24 hour  Intake 2032.16 ml  Output 600 ml  Net 1432.16 ml   Filed Weights   12/23/22 0500 12/25/22 0605 12/27/22 0500  Weight: 82.4 kg 87.7 kg 88.5 kg   Weight change:  Body mass index is 27.21 kg/m.   Physical Exam: General exam: Elderly African-American male.  Not in pain. Skin: No rashes, lesions or ulcers. HEENT: Atraumatic, normocephalic, no obvious bleeding.  NG tube in place.  No evidence of external head injury from fall Lungs: Diminished air entry in both bases CVS: Regular rate and rhythm, no murmur GI/Abd soft, nontender, nondistended, bowel sound present CNS: Alert, awake, slow to respond mumbles to answers.  Only mumbles. psychiatry: Sad affect Extremities: No pedal edema, no calf tenderness  Data Review: I have personally reviewed the laboratory data and studies available.  F/u labs ordered Unresulted Labs (From admission, onward)     Start     Ordered   12/22/22 0813  Rapid urine drug screen (hospital performed)  Add-on,   AD         12/22/22 0812              Total time spent in review of labs and imaging, patient evaluation, formulation of plan, documentation and communication with family: 45 minutes  Signed, Lorin Glass, MD [facility] 12/27/2022

## 2022-12-28 DIAGNOSIS — R569 Unspecified convulsions: Secondary | ICD-10-CM | POA: Diagnosis not present

## 2022-12-28 LAB — GLUCOSE, CAPILLARY
Glucose-Capillary: 88 mg/dL (ref 70–99)
Glucose-Capillary: 91 mg/dL (ref 70–99)
Glucose-Capillary: 96 mg/dL (ref 70–99)

## 2022-12-28 MED ORDER — VITAMIN B-12 1000 MCG PO TABS
1000.0000 ug | ORAL_TABLET | Freq: Every day | ORAL | Status: DC
Start: 2022-12-28 — End: 2022-12-31
  Administered 2022-12-28 – 2022-12-30 (×3): 1000 ug via ORAL
  Filled 2022-12-28 (×3): qty 1

## 2022-12-28 MED ORDER — TAMSULOSIN HCL 0.4 MG PO CAPS
0.4000 mg | ORAL_CAPSULE | Freq: Every day | ORAL | Status: DC
  Administered 2022-12-28 – 2022-12-30 (×3): 0.4 mg via ORAL
  Filled 2022-12-28 (×3): qty 1

## 2022-12-28 MED ORDER — ATORVASTATIN CALCIUM 40 MG PO TABS
40.0000 mg | ORAL_TABLET | Freq: Every day | ORAL | Status: DC
  Administered 2022-12-28 – 2022-12-30 (×3): 40 mg via ORAL
  Filled 2022-12-28 (×3): qty 1

## 2022-12-28 MED ORDER — FOLIC ACID 1 MG PO TABS
1.0000 mg | ORAL_TABLET | Freq: Every day | ORAL | Status: DC
  Administered 2022-12-28 – 2022-12-30 (×3): 1 mg via ORAL
  Filled 2022-12-28 (×3): qty 1

## 2022-12-28 MED ORDER — ASPIRIN 81 MG PO TBEC
81.0000 mg | DELAYED_RELEASE_TABLET | Freq: Every day | ORAL | Status: DC
  Administered 2022-12-28 – 2022-12-30 (×3): 81 mg via ORAL
  Filled 2022-12-28 (×3): qty 1

## 2022-12-28 MED ORDER — FUROSEMIDE 20 MG PO TABS
20.0000 mg | ORAL_TABLET | Freq: Every day | ORAL | Status: DC
  Administered 2022-12-28 – 2022-12-30 (×3): 20 mg via ORAL
  Filled 2022-12-28 (×3): qty 1

## 2022-12-28 NOTE — NC FL2 (Signed)
[place] MEDICAID FL2 LEVEL OF CARE FORM     IDENTIFICATION  Patient Name: John Spears Birthdate: [birth year] Sex: male Admission Date (Current Location): 12/21/2022  [facility] and [place] Number:  Chiropodist and Address:  [facility], [address] [place], [place] [address]      Provider Number: [id number]  Attending Physician Name and Address:  Lorin Glass, MD  Relative Name and Phone Number:       Current Level of Care: Hospital Recommended Level of Care: Skilled Nursing Facility Prior Approval Number:    Date Approved/Denied:   PASRR Number: [id number]  Discharge Plan: SNF    Current Diagnoses: Patient Active Problem List   Diagnosis Date Noted   Malnutrition of moderate degree 12/24/2022   Encephalopathy acute 12/22/2022   Acute respiratory failure with hypoxia (HCC) 12/22/2022   ETOH abuse 12/22/2022   Hypophosphatemia 12/22/2022   Thrombocytopenia (HCC) 12/22/2022   Breakthrough seizure (HCC) 12/22/2022   Seizures (HCC) 12/21/2022    Orientation RESPIRATION BLADDER Height & Weight     Self, Place  Normal Incontinent Weight: 201 lb 1 oz (91.2 kg) Height:  5\' 11"  (180.3 cm)  BEHAVIORAL SYMPTOMS/MOOD NEUROLOGICAL BOWEL NUTRITION STATUS      Incontinent, Continent (continent and incontinent at times) Diet (see DC summary)  AMBULATORY STATUS COMMUNICATION OF NEEDS Skin   Extensive Assist Verbally Normal                       Personal Care Assistance Level of Assistance  Bathing, Feeding, Dressing Bathing Assistance: Limited assistance Feeding assistance: Limited assistance Dressing Assistance: Limited assistance     Functional Limitations Info  Speech     Speech Info: Impaired    SPECIAL CARE FACTORS FREQUENCY  PT (By licensed PT), OT (By licensed OT), Speech therapy     PT Frequency: 5x/wk OT Frequency: 5x/wk     Speech Therapy Frequency: 5x/wk      Contractures Contractures Info: Not  present    Additional Factors Info  Code Status, Allergies Code Status Info: Full Allergies Info: NKA           Current Medications (12/28/2022):  This is the current hospital active medication list Current Facility-Administered Medications  Medication Dose Route Frequency Provider Last Rate Last Admin   aspirin EC tablet 81 mg  81 mg Oral Daily Dahal, Binaya, MD   81 mg at 12/28/22 0936   atorvastatin (LIPITOR) tablet 40 mg  40 mg Oral QHS Dahal, Binaya, MD       carvedilol (COREG) tablet 25 mg  25 mg Oral BID WC Dahal, Melina Schools, MD   25 mg at 12/28/22 0936   Chlorhexidine Gluconate Cloth 2 % PADS 6 each  6 each Topical Daily Leslye Peer, MD   6 each at 12/23/22 1523   cyanocobalamin (VITAMIN B12) tablet 1,000 mcg  1,000 mcg Oral Daily Dahal, Melina Schools, MD   1,000 mcg at 12/28/22 0937   feeding supplement (ENSURE ENLIVE / ENSURE PLUS) liquid 237 mL  237 mL Oral BID BM Dahal, Binaya, MD   237 mL at 12/28/22 0945   feeding supplement (OSMOLITE 1.5 CAL) liquid 1,000 mL  1,000 mL Per Tube Q24H Dahal, Melina Schools, MD   Infusion Verify at 12/27/22 1030   feeding supplement (PROSource TF20) liquid 60 mL  60 mL Per Tube Daily Dahal, Melina Schools, MD   60 mL at 12/28/22 0942   folic acid (FOLVITE) tablet 1 mg  1 mg Oral Daily Dahal, Binaya, MD   1 mg at 12/28/22 0937   free water 200 mL  200 mL Per Tube Q6H Bowser, Grace E, NP   200 mL at 12/27/22 1200   furosemide (LASIX) tablet 20 mg  20 mg Oral Daily Dahal, Binaya, MD   20 mg at 12/28/22 0937   heparin injection 5,000 Units  5,000 Units Subcutaneous Q8H Desai, Rahul P, PA-C   5,000 Units at 12/28/22 1610   hydrALAZINE (APRESOLINE) injection 10-20 mg  10-20 mg Intravenous Q6H PRN Leslye Peer, MD   10 mg at 12/23/22 1521   levETIRAcetam (KEPPRA) tablet 1,000 mg  1,000 mg Oral BID Lorin Glass, MD   1,000 mg at 12/28/22 0936   LORazepam (ATIVAN) injection 1 mg  1 mg Intravenous Q4H PRN Leslye Peer, MD       LORazepam (ATIVAN) injection 1 mg  1 mg  Intravenous Q4H PRN Lanier Clam, NP   1 mg at 12/24/22 0037   multivitamin with minerals tablet 1 tablet  1 tablet Oral Daily Dahal, Melina Schools, MD   1 tablet at 12/28/22 9604   Oral care mouth rinse  15 mL Mouth Rinse PRN Agarwala, Daleen Bo, MD       sodium chloride flush (NS) 0.9 % injection 3 mL  3 mL Intravenous Q12H Lynnell Catalan, MD   3 mL at 12/28/22 0947   tamsulosin (FLOMAX) capsule 0.4 mg  0.4 mg Oral Daily Dahal, Melina Schools, MD   0.4 mg at 12/28/22 5409   thiamine (VITAMIN B1) tablet 100 mg  100 mg Oral Daily Dahal, Melina Schools, MD   100 mg at 12/28/22 8119     Discharge Medications: Please see discharge summary for a list of discharge medications.  Relevant Imaging Results:  Relevant Lab Results:   Additional Information SS#: [id number]  Baldemar Lenis, LCSW

## 2022-12-28 NOTE — Progress Notes (Signed)
PROGRESS NOTE  John Spears  DOB: January 24, 1951  PCP: Patient, No Pcp Per [id number]  DOA: 12/21/2022  LOS: 7 days  Hospital Day: 8  Brief narrative: John Spears is a 72 y.o. male with PMH significant for HTN, prior left MCA stroke, seizure with questionable compliance to Keppra, chronic alcoholism drinks about 6-12 beers per day as well as an entire bottle of hard liquor. 10/28, patient was brought to the ED after a witnessed GTCS leading to fall.  Per report, family left him on the ground for about 3 hours to see if he would recover.  He was subsequently able to stand up and then fell again and seized again. EMS found him unresponsive with blood pressure elevated to 40s.  En route to the ED, he had another episode of seizure.  In the ED he was postictal and unable to protect airway requiring intubation and mechanical ventilation. On exam, he was also noted to have left gaze preference and right-sided weakness consistent with postictal Todd's phenomena. Neurology started him on Keppra. Labs with lactic acid significantly elevated to 12.5, blood alcohol level mildly elevated to 11. CT head and cervical spine did not show any intracranial injury or cervical fracture/dislocation.  It showed a remote left posterior MCA infarct. CT chest/abdomen/pelvis showed right lower lobe consolidation raising concern of infection. He was admitted to ICU, started on IV Unasyn, CIWA protocol  His clinical status gradually improved, extubated next day on 10/29.  Transferred out to [facility] on 10/31.  Subjective: Patient was seen and examined this morning.  Sitting up in recliner.  Not in distress.  More awake and conversational Seen by PT.  Assessment and plan: GTCS In the setting of chronic heavy alcoholism, suspect noncompliance to Keppra. Neurology has restarted on Keppra 1000 mg twice daily Seizure precautions  Acute metabolic encephalopathy Todd's paresis Altered mentation due to  postictal status.  Initial left gaze preference and right-sided weakness due to Todd's paresis. Mental status and weakness gradually improving.  Acute respiratory failure with hypoxia Initially unable to protect airway because of low GCS.  Required intubation for about 24 hours.  Successfully extubated.  Currently on room air  Acute alcohol withdrawal symptoms Chronic alcohol abuse Currently on CIWA protocol.  I stopped phenobarbital tapering course today because of persistent altered mental status. Continue multivitamins  Dysphagia Moderate malnutrition Dietitian and speech therapy consult obtained.  Currently on NG tube feeding.  Dysphagia 1 diet started as well.  Fall Patient had an unwitnessed fall.  Found on the floor.  Mental status change hard to assess CT scan of head pending  Hypertension PTA on Coreg 25 mg twice daily and Lasix 20 mg daily  Currently continued on Coreg 20 mg daily.  Blood pressure constantly elevated to 160s and 170s. Resume Lasix permanently.  H/o prior left MCA stroke Resume aspirin and statin  BPH Resume Flomax  Chronic anemia Hemoglobin close to 9. Continue B12, folic acid supplement Recent Labs    12/22/22 0551 12/22/22 0616 12/22/22 0905 12/23/22 0755 12/24/22 0656 12/26/22 1128  HGB 10.5* 10.4*  --  10.3* 9.8* 8.9*  MCV  --  89.3  --  91.7 92.1 91.4  VITAMINB12  --   --  948*  --   --   --    Mobility: Needs PT eval once mental status improves  Goals of care   Code Status: Full Code     DVT prophylaxis:  heparin injection 5,000 Units Start: 12/21/22 2200 SCDs Start:  12/21/22 1933   Antimicrobials: IV Unasyn completed on 10/31. Fluid: None Consultants: None Family Communication: None at bedside  Status: Inpatient Level of care:  Progressive   Patient is from: Home Needs to continue in-hospital care: Mental status remains altered.  Muscle strength remains weak.  Stopped phenobarbital yesterday 11/3. Anticipated d/c to:  Pending clinical course  Diet:  Diet Order             DIET - DYS 1 Room service appropriate? No; Fluid consistency: Thin  Diet effective now                   Scheduled Meds:  aspirin EC  81 mg Oral Daily   atorvastatin  40 mg Oral QHS   carvedilol  25 mg Oral BID WC   Chlorhexidine Gluconate Cloth  6 each Topical Daily   cyanocobalamin  1,000 mcg Oral Daily   feeding supplement  237 mL Oral BID BM   feeding supplement (OSMOLITE 1.5 CAL)  1,000 mL Per Tube Q24H   feeding supplement (PROSource TF20)  60 mL Per Tube Daily   folic acid  1 mg Oral Daily   free water  200 mL Per Tube Q6H   furosemide  20 mg Oral Daily   heparin  5,000 Units Subcutaneous Q8H   levETIRAcetam  1,000 mg Oral BID   multivitamin with minerals  1 tablet Oral Daily   sodium chloride flush  3 mL Intravenous Q12H   tamsulosin  0.4 mg Oral Daily   thiamine  100 mg Oral Daily    PRN meds: hydrALAZINE, LORazepam, LORazepam, mouth rinse   Infusions:     Antimicrobials: Anti-infectives (From admission, onward)    Start     Dose/Rate Route Frequency Ordered Stop   12/21/22 2115  Ampicillin-Sulbactam (UNASYN) 3 g in sodium chloride 0.9 % 100 mL IVPB        3 g 200 mL/hr over 30 Minutes Intravenous Every 8 hours 12/21/22 2016 12/24/22 1345       Objective: Vitals:   12/28/22 0742 12/28/22 1209  BP: (!) 161/80 (!) 151/75  Pulse: 73 73  Resp: 17 20  Temp: 98 F (36.7 C) 99.3 F (37.4 C)  SpO2: 100% 99%    Intake/Output Summary (Last 24 hours) at 12/28/2022 1338 Last data filed at 12/28/2022 0509 Gross per 24 hour  Intake 50 ml  Output 550 ml  Net -500 ml   Filed Weights   12/25/22 0605 12/27/22 0500 12/28/22 0344  Weight: 87.7 kg 88.5 kg 91.2 kg   Weight change: 2.7 kg Body mass index is 28.04 kg/m.   Physical Exam: General exam: Elderly African-American male.  Not in pain. Skin: No rashes, lesions or ulcers. HEENT: Atraumatic, normocephalic, no obvious bleeding.  Lungs:  Diminished air entry in both bases CVS: Regular rate and rhythm, no murmur GI/Abd soft, nontender, nondistended, bowel sound present CNS: Alert, awake, able to answer questions. psychiatry: Sad affect Extremities: No pedal edema, no calf tenderness  Data Review: I have personally reviewed the laboratory data and studies available.  F/u labs ordered Unresulted Labs (From admission, onward)     Start     Ordered   12/29/22 0500  Basic metabolic panel  Tomorrow morning,   R       Question:  Specimen collection method  Answer:  Lab=Lab collect   12/28/22 0905   12/29/22 0500  CBC with Differential/Platelet  Tomorrow morning,   R  Question:  Specimen collection method  Answer:  Lab=Lab collect   12/28/22 0905   12/22/22 0813  Rapid urine drug screen (hospital performed)  Add-on,   AD        12/22/22 0812              Total time spent in review of labs and imaging, patient evaluation, formulation of plan, documentation and communication with family: 45 minutes  Signed, Lorin Glass, MD [facility] 12/28/2022

## 2022-12-28 NOTE — TOC Progression Note (Addendum)
Transition of Care [facility]) - Progression Note    Patient Details  Name: John Spears MRN: [id number] Date of Birth: February 29, 1950  Transition of [facility]) CM/SW Contact  Baldemar Lenis, [place] Phone Number: 12/28/2022, 11:28 AM  Clinical Narrative:   CSW spoke with sister, Kathie Rhodes, about recommendation for SNF. Kathie Rhodes in agreement, preference for [facility] or [facility] as they are closer for her. Kathie Rhodes said it's very hard for her and her sister to get to [facility] to check on the patient because they do not like to drive far. CSW completed referral and faxed out, asked [facility] to review. CSW to follow.  UPDATE: [facility] has offered a bed for the patient. CSW requested CMA to initiate insurance authorization.    Expected Discharge Plan: Skilled Nursing Facility Barriers to Discharge: Continued Medical Work up, English as a second language teacher, SNF Pending bed offer  Expected Discharge Plan and Services In-house Referral: Clinical Social Work     Living arrangements for the past 2 months: Single Family Home                                       Social Determinants of Health (SDOH) Interventions    Readmission Risk Interventions     No data to display

## 2022-12-28 NOTE — Plan of Care (Signed)
  Problem: Clinical Measurements: Goal: Cardiovascular complication will be avoided Outcome: Progressing   Problem: Activity: Goal: Risk for activity intolerance will decrease Outcome: Progressing   Problem: Nutrition: Goal: Adequate nutrition will be maintained Outcome: Progressing   Problem: Coping: Goal: Level of anxiety will decrease Outcome: Progressing   Problem: Elimination: Goal: Will not experience complications related to bowel motility Outcome: Progressing Goal: Will not experience complications related to urinary retention Outcome: Progressing   Problem: Safety: Goal: Ability to remain free from injury will improve Outcome: Progressing   Problem: Skin Integrity: Goal: Risk for impaired skin integrity will decrease Outcome: Progressing

## 2022-12-28 NOTE — Plan of Care (Signed)
  Problem: Education: Goal: Knowledge of General Education information will improve Description Including pain rating scale, medication(s)/side effects and non-pharmacologic comfort measures Outcome: Progressing   Problem: Health Behavior/Discharge Planning: Goal: Ability to manage health-related needs will improve Outcome: Progressing   Problem: Clinical Measurements: Goal: Ability to maintain clinical measurements within normal limits will improve Outcome: Progressing   Problem: Clinical Measurements: Goal: Will remain free from infection Outcome: Progressing   Problem: Clinical Measurements: Goal: Cardiovascular complication will be avoided Outcome: Progressing   Problem: Activity: Goal: Risk for activity intolerance will decrease Outcome: Progressing   Problem: Nutrition: Goal: Adequate nutrition will be maintained Outcome: Progressing

## 2022-12-28 NOTE — Progress Notes (Signed)
Physical Therapy Treatment Patient Details Name: John Spears MRN: [id number] DOB: May 07, 1950 Today's Date: 12/28/2022   History of Present Illness 72 yo male s/p fall 10/28 with seizure at home. On the ground ~3 hours prior to call for EMS, unresponsive, SBP 240. CIWA due to ETOH abuse. ETT 10/28-10/29. PMH: seizures on keppra, HTN, L MCA CVA    PT Comments  Pt greeted resting in bed and agreeable to session with good progress towards acute goals. Pt requiring min A to complete bed mobility, min A for transfers and grossly min A for gait with RW for support, with pt needing mod A during turning to maintain balance. Pt needing cues throughout gait for upright posture, pt with downward gaze throughout, and max cues for navigation as pt unable to locate room on return. Pt continues to demonstrate poor recall needing cues for hand placement on rise x4 throughout session. Current plan remains appropriate to address deficits and maximize functional independence and decrease caregiver burden. Pt continues to benefit from skilled PT services to progress toward functional mobility goals.      If plan is discharge home, recommend the following: Assistance with cooking/housework;Direct supervision/assist for financial management;Direct supervision/assist for medications management;Assist for transportation;Help with stairs or ramp for entrance;A lot of help with walking and/or transfers;A lot of help with bathing/dressing/bathroom   Can travel by private vehicle     No  Equipment Recommendations  Other (comment) (TBA)    Recommendations for Other Services       Precautions / Restrictions Precautions Precautions: Fall Restrictions Weight Bearing Restrictions: No     Mobility  Bed Mobility Overal bed mobility: Needs Assistance Bed Mobility: Supine to Sit     Supine to sit: HOB elevated, Min assist Sit to supine:  (+3)   General bed mobility comments: lioght min A to eleavte trunk,  good initiation with cues    Transfers Overall transfer level: Needs assistance Equipment used: Rolling walker (2 wheels) Transfers: Sit to/from Stand, Bed to chair/wheelchair/BSC Sit to Stand: Min assist, Contact guard assist           General transfer comment: light min A on initial rise to stand, CGA from chair, performing x4 throughout session    Ambulation/Gait Ambulation/Gait assistance: Min assist, Mod assist Gait Distance (Feet): 24 Feet (+ 65') Assistive device: Rolling walker (2 wheels) Gait Pattern/deviations: Decreased stride length, Trunk flexed, Step-through pattern Gait velocity: reduced     General Gait Details: min A to steady with cues for upright trunk, mod A to maintain balance during truning   Stairs             Wheelchair Mobility     Tilt Bed    Modified Rankin (Stroke Patients Only) Modified Rankin (Stroke Patients Only) Pre-Morbid Rankin Score: No symptoms Modified Rankin: Moderately severe disability     Balance Overall balance assessment: Needs assistance Sitting-balance support: Feet supported, Bilateral upper extremity supported Sitting balance-Leahy Scale: Fair Sitting balance - Comments: supervision for safety   Standing balance support: Bilateral upper extremity supported, During functional activity, Reliant on assistive device for balance Standing balance-Leahy Scale: Poor Standing balance comment: Reliant on UE support                            Cognition Arousal: Alert Behavior During Therapy: Flat affect Overall Cognitive Status: Impaired/Different from baseline Area of Impairment: Attention, Memory, Following commands, Safety/judgement, Awareness, Problem solving, Orientation  Orientation Level: Disoriented to, Time, Situation Current Attention Level: Focused Memory: Decreased short-term memory, Decreased recall of precautions Following Commands: Follows one step commands  inconsistently, Follows one step commands with increased time Safety/Judgement: Decreased awareness of safety, Decreased awareness of deficits Awareness: Intellectual Problem Solving: Slow processing, Decreased initiation, Difficulty sequencing, Requires verbal cues, Requires tactile cues General Comments: pt disoriented to year and situation, making some conversation but general flat affect        Exercises Other Exercises Other Exercises: warm up LE exercise seated up EOB, LAQ x20, seated marching x20, standing marching x20    General Comments General comments (skin integrity, edema, etc.): VSS on RA      Pertinent Vitals/Pain Pain Assessment Pain Assessment: No/denies pain Pain Intervention(s): Monitored during session    Home Living                          Prior Function            PT Goals (current goals can now be found in the care plan section) Acute Rehab PT Goals Patient Stated Goal: did not state PT Goal Formulation: With patient Time For Goal Achievement: 01/06/23 Progress towards PT goals: Progressing toward goals    Frequency    Min 1X/week      PT Plan      Co-evaluation              AM-PAC PT "6 Clicks" Mobility   Outcome Measure  Help needed turning from your back to your side while in a flat bed without using bedrails?: A Little Help needed moving from lying on your back to sitting on the side of a flat bed without using bedrails?: A Little Help needed moving to and from a bed to a chair (including a wheelchair)?: A Little Help needed standing up from a chair using your arms (e.g., wheelchair or bedside chair)?: A Little Help needed to walk in hospital room?: A Lot Help needed climbing 3-5 steps with a railing? : Total 6 Click Score: 15    End of Session Equipment Utilized During Treatment: Gait belt Activity Tolerance: Patient tolerated treatment well Patient left: in chair;with call bell/phone within reach;with chair  alarm set Nurse Communication: Mobility status PT Visit Diagnosis: Unsteadiness on feet (R26.81);Muscle weakness (generalized) (M62.81);Difficulty in walking, not elsewhere classified (R26.2);Other symptoms and signs involving the nervous system (R29.898);Other abnormalities of gait and mobility (R26.89)     Time: 8657-8469 PT Time Calculation (min) (ACUTE ONLY): 23 min  Charges:    $Gait Training: 8-22 mins $Therapeutic Activity: 8-22 mins PT General Charges $$ ACUTE PT VISIT: 1 Visit                     Advith Martine R. PTA Acute Rehabilitation Services Office: [phone]   Catalina Antigua 12/28/2022, 10:26 AM

## 2022-12-29 DIAGNOSIS — R569 Unspecified convulsions: Secondary | ICD-10-CM | POA: Diagnosis not present

## 2022-12-29 LAB — CBC WITH DIFFERENTIAL/PLATELET
Abs Immature Granulocytes: 0.01 10*3/uL (ref 0.00–0.07)
Basophils Absolute: 0 10*3/uL (ref 0.0–0.1)
Basophils Relative: 1 %
Eosinophils Absolute: 0.3 10*3/uL (ref 0.0–0.5)
Eosinophils Relative: 6 %
HCT: 27.1 % — ABNORMAL LOW (ref 39.0–52.0)
Hemoglobin: 8.8 g/dL — ABNORMAL LOW (ref 13.0–17.0)
Immature Granulocytes: 0 %
Lymphocytes Relative: 34 %
Lymphs Abs: 1.7 10*3/uL (ref 0.7–4.0)
MCH: 30 pg (ref 26.0–34.0)
MCHC: 32.5 g/dL (ref 30.0–36.0)
MCV: 92.5 fL (ref 80.0–100.0)
Monocytes Absolute: 1.1 10*3/uL — ABNORMAL HIGH (ref 0.1–1.0)
Monocytes Relative: 22 %
Neutro Abs: 1.9 10*3/uL (ref 1.7–7.7)
Neutrophils Relative %: 37 %
Platelets: 140 10*3/uL — ABNORMAL LOW (ref 150–400)
RBC: 2.93 MIL/uL — ABNORMAL LOW (ref 4.22–5.81)
RDW: 14.2 % (ref 11.5–15.5)
WBC: 4.9 10*3/uL (ref 4.0–10.5)
nRBC: 0 % (ref 0.0–0.2)

## 2022-12-29 LAB — BASIC METABOLIC PANEL
Anion gap: 6 (ref 5–15)
BUN: 20 mg/dL (ref 8–23)
CO2: 27 mmol/L (ref 22–32)
Calcium: 9.3 mg/dL (ref 8.9–10.3)
Chloride: 105 mmol/L (ref 98–111)
Creatinine, Ser: 1.12 mg/dL (ref 0.61–1.24)
GFR, Estimated: >60 mL/min (ref >60)
Glucose, Bld: 91 mg/dL (ref 70–99)
Potassium: 3.9 mmol/L (ref 3.5–5.1)
Sodium: 138 mmol/L (ref 135–145)

## 2022-12-29 LAB — PHOSPHORUS: Phosphorus: 3.5 mg/dL (ref 2.5–4.6)

## 2022-12-29 LAB — MAGNESIUM: Magnesium: 1.7 mg/dL (ref 1.7–2.4)

## 2022-12-29 NOTE — Plan of Care (Signed)

## 2022-12-29 NOTE — Plan of Care (Signed)

## 2022-12-29 NOTE — Plan of Care (Signed)
  Problem: Activity: Goal: Risk for activity intolerance will decrease Outcome: Progressing   Problem: Nutrition: Goal: Adequate nutrition will be maintained Outcome: Progressing   Problem: Coping: Goal: Level of anxiety will decrease Outcome: Progressing   Problem: Elimination: Goal: Will not experience complications related to bowel motility Outcome: Progressing   Problem: Elimination: Goal: Will not experience complications related to urinary retention Outcome: Progressing   Problem: Pain Management: Goal: General experience of comfort will improve Outcome: Progressing   Problem: Safety: Goal: Ability to remain free from injury will improve Outcome: Progressing   Problem: Skin Integrity: Goal: Risk for impaired skin integrity will decrease Outcome: Progressing

## 2022-12-29 NOTE — TOC Progression Note (Addendum)
Transition of Care [facility]) - Progression Note    Patient Details  Name: John Spears MRN: [id number] Date of Birth: 06-22-1950  Transition of Care [facility]) CM/SW Contact  Erin Sons, [place] Phone Number: 12/29/2022, 10:27 AM  Clinical Narrative:     CSW spoke with Tammy at [facility] and informed they have to rescind bed offer due to pt's history with ETOH. TOC will follow up with family to discuss alternative SNF choice.   CSW called pt's sister twice. Phone rang with no answer and no option to leave voicemail. Did not see sister bedside. Will try again later.   Expected Discharge Plan: Skilled Nursing Facility Barriers to Discharge: Continued Medical Work up, English as a second language teacher, SNF Pending bed offer  Expected Discharge Plan and Services In-house Referral: Clinical Social Work     Living arrangements for the past 2 months: Single Family Home                                       Social Determinants of Health (SDOH) Interventions    Readmission Risk Interventions     No data to display

## 2022-12-29 NOTE — Progress Notes (Signed)
Occupational Therapy Treatment Patient Details Name: John Spears MRN: [id number] DOB: 03/16/1950 Today's Date: 12/29/2022   History of present illness 72 yo male s/p fall 10/28 with seizure at home. On the ground ~3 hours prior to call for EMS, unresponsive, SBP 240. CIWA due to ETOH abuse. ETT 10/28-10/29. PMH: seizures on keppra, HTN, L MCA CVA   OT comments  Pt progressing towards goals this session, able to ambulate in room and complete simulated toilet transfer with CGA and RW. Pt needing cues to keep RW close, attempts to abandon RW during session x3. Pt minA  for UB ADL, able to stand at sink x10 min to perform functional task. Missing 2 targets on R side, noted pt to be keeping chin down and gaze to L superior visual field when performing functional tasks. Pt presenting with impairments listed below, will follow acutely. Patient will benefit from continued inpatient follow up therapy, <3 hours/day to maximize safety/ind with ADLs/functional mobility.       If plan is discharge home, recommend the following:  Two people to help with walking and/or transfers;Two people to help with bathing/dressing/bathroom   Equipment Recommendations  Other (comment);BSC/3in1 (RW)    Recommendations for Other Services Speech consult    Precautions / Restrictions Precautions Precautions: Fall Restrictions Weight Bearing Restrictions: No       Mobility Bed Mobility Overal bed mobility: Needs Assistance Bed Mobility: Supine to Sit     Supine to sit: Contact guard          Transfers Overall transfer level: Needs assistance Equipment used: Rolling walker (2 wheels) Transfers: Sit to/from Stand, Bed to chair/wheelchair/BSC Sit to Stand: Contact guard assist                 Balance Overall balance assessment: Needs assistance Sitting-balance support: Feet supported, Bilateral upper extremity supported Sitting balance-Leahy Scale: Fair Sitting balance - Comments:  supervision for safety   Standing balance support: Bilateral upper extremity supported, During functional activity, Reliant on assistive device for balance Standing balance-Leahy Scale: Poor Standing balance comment: Reliant on UE support                           ADL either performed or assessed with clinical judgement   ADL Overall ADL's : Needs assistance/impaired                 Upper Body Dressing : Minimal assistance;Sitting Upper Body Dressing Details (indicate cue type and reason): donning gown as jacket     Toilet Transfer: Contact guard assist;Ambulation;Regular Toilet;Rolling walker (2 wheels)   Toileting- Clothing Manipulation and Hygiene: Total assistance Toileting - Clothing Manipulation Details (indicate cue type and reason): catheter            Extremity/Trunk Assessment Upper Extremity Assessment Upper Extremity Assessment: Generalized weakness   Lower Extremity Assessment Lower Extremity Assessment: Defer to PT evaluation        Vision   Vision Assessment?: Vision impaired- to be further tested in functional context Additional Comments: tracks to all quadrants with additional head turns/eye movements, noted to miss 2 items in R visual field, keeps chin down and looks up at L superior visual field during functional tasks   Perception Perception Perception: Not tested   Praxis Praxis Praxis: Not tested    Cognition Arousal: Alert Behavior During Therapy: Flat affect Overall Cognitive Status: Impaired/Different from baseline  Orientation Level: Situation Current Attention Level: Focused   Following Commands: Follows one step commands inconsistently, Follows one step commands with increased time Safety/Judgement: Decreased awareness of safety, Decreased awareness of deficits Awareness: Emergent Problem Solving: Slow processing, Decreased initiation, Difficulty sequencing, Requires verbal cues, Requires  tactile cues General Comments: decr awareness of safety, attmepting to abandon RW multiple times        Exercises Other Exercises Other Exercises: standing reaching/erasing task at sink/mirror    Shoulder Instructions       General Comments VSS    Pertinent Vitals/ Pain       Pain Assessment Pain Assessment: No/denies pain  Home Living                                          Prior Functioning/Environment              Frequency  Min 1X/week        Progress Toward Goals  OT Goals(current goals can now be found in the care plan section)  Progress towards OT goals: Progressing toward goals  Acute Rehab OT Goals Patient Stated Goal: none stated OT Goal Formulation: With patient Time For Goal Achievement: 01/06/23 Potential to Achieve Goals: Good ADL Goals Pt Will Perform Grooming: with mod assist;sitting Additional ADL Goal #1: pt will follow 1 step command 75% of session Additional ADL Goal #2: pt will locate object in R visual field 50% of attempts during session  Plan      Co-evaluation                 AM-PAC OT "6 Clicks" Daily Activity     Outcome Measure   Help from another person eating meals?: A Little Help from another person taking care of personal grooming?: A Little Help from another person toileting, which includes using toliet, bedpan, or urinal?: A Lot Help from another person bathing (including washing, rinsing, drying)?: A Lot Help from another person to put on and taking off regular upper body clothing?: A Little Help from another person to put on and taking off regular lower body clothing?: A Lot 6 Click Score: 15    End of Session Equipment Utilized During Treatment: Rolling walker (2 wheels);Gait belt  OT Visit Diagnosis: Unsteadiness on feet (R26.81);Muscle weakness (generalized) (M62.81)   Activity Tolerance Patient tolerated treatment well   Patient Left in bed;with call bell/phone within reach;with  bed alarm set;with restraints reapplied   Nurse Communication Mobility status;Precautions;Need for lift equipment        Time: 0981-1914 OT Time Calculation (min): 18 min  Charges: OT General Charges $OT Visit: 1 Visit OT Treatments $Self Care/Home Management : 8-22 mins  John Fila, OTD, OTR/L SecureChat Preferred Acute Rehab [phone]   John Spears 12/29/2022, 11:40 AM

## 2022-12-29 NOTE — TOC Progression Note (Signed)
Transition of Care [facility]) - Progression Note    Patient Details  Name: John Spears MRN: [id number] Date of Birth: March 21, 1950  Transition of [facility]) CM/SW Contact  Erin Sons, [place] Phone Number: 12/29/2022, 3:40 PM  Clinical Narrative:     CSW met with pt's spouse outside of room. CSW updated her that [facility] rescinded offer. Spouse would like to go with [facility] alternatively as pt has been there previously. CSW confirmed with [facility] that they can admit tomorrow. Called [place] and changed facility on auth. Auth approved 11/04- 11/06. Auth# [id number]  Expected Discharge Plan: Skilled Nursing Facility Barriers to Discharge: Continued Medical Work up, English as a second language teacher, SNF Pending bed offer  Expected Discharge Plan and Services In-house Referral: Clinical Social Work     Living arrangements for the past 2 months: Single Family Home                                       Social Determinants of Health (SDOH) Interventions    Readmission Risk Interventions     No data to display

## 2022-12-29 NOTE — Progress Notes (Signed)
PROGRESS NOTE  John Spears  DOB: 06-15-1950  PCP: Patient, No Pcp Per ZOX:[id number]  DOA: 12/21/2022  LOS: 8 days  Hospital Day: 9  Brief narrative: John Spears is a 72 y.o. male with PMH significant for HTN, prior left MCA stroke, seizure with questionable compliance to Keppra, chronic alcoholism drinks about 6-12 beers per day as well as an entire bottle of hard liquor. 10/28, patient was brought to the ED after a witnessed GTCS leading to fall.   Per report, family left him on the ground for about 3 hours to see if he would recover.  He was subsequently able to stand up and then fell and seized again. EMS found him unresponsive with blood pressure elevated to 240s.  En route to the ED, he had another episode of seizure.  In the ED he was postictal and unable to protect airway requiring intubation and mechanical ventilation. On exam, he was also noted to have left gaze preference and right-sided weakness consistent with postictal Todd's phenomena. Neurology started him on Keppra. Labs with lactic acid significantly elevated to 12.5, blood alcohol level mildly elevated to 11. CT head and cervical spine did not show any intracranial injury or cervical fracture/dislocation.  It showed a remote left posterior MCA infarct. CT chest/abdomen/pelvis showed right lower lobe consolidation raising concern of infection. He was admitted to ICU, started on IV Unasyn, CIWA protocol  His clinical status gradually improved, extubated next day on 10/29. Transferred out to [facility] on 10/31.  In the last few days, his mental status has been gradually improving and he is currently pending SNF placement  Subjective: Patient was seen and examined this morning.   Sitting up in recliner.  Not in distress.  More awake and conversational.  Assessment and plan: GTCS In the setting of chronic heavy alcoholism and suspected noncompliance to Keppra. Per neurology recommendation, patient is currently  on Keppra 1000 mg twice daily Seizure precautions inpatient and outpatient  Acute metabolic encephalopath Todd's paresis Altered mentation on admission was due to postictal status.  Initial left gaze preference and right-sided weakness due to Todd's paresis. Mental status as well as weakness gradually improving.  Acute respiratory failure with hypoxia -resolved Initially, he was unable to protect airway because of low GCS.  Required intubation for about 24 hours.  Successfully extubated.  Currently on room air  Acute alcohol withdrawal symptoms Chronic alcohol abuse Was drinking about 6-12 beers per day as well as an entire bottle of hard liquor. Placed on CIWA protocol.  Completed phenobarbital detox.   Continue multivitamins  Dysphagia Moderate malnutrition Initially required NG tube feeding.  After mental status improved, NGT was discontinued.  Currently on Dysphagia 1 diet.  Speech therapy to follow-up.  Fall 11/2, patient had an unwitnessed fall in the hospital.  CT head did not show any change.  Hypertension PTA on Coreg 25 mg twice daily and Lasix 20 mg daily  Currently continued on both.  Continue monitor blood pressure control.  IV hydralazine as needed.  H/o prior left MCA stroke Continue aspirin and statin  BPH Continue Flomax  Chronic anemia Hemoglobin close to 9. Continue B12, folic acid supplement Recent Labs    12/22/22 0616 12/22/22 0905 12/23/22 0755 12/24/22 0656 12/26/22 1128 12/29/22 0456  HGB 10.4*  --  10.3* 9.8* 8.9* 8.8*  MCV 89.3  --  91.7 92.1 91.4 92.5  VITAMINB12  --  948*  --   --   --   --  Impaired mobility  PT eval obtained.  SNF recommended.   Goals of care   Code Status: Full Code    DVT prophylaxis:  heparin injection 5,000 Units Start: 12/21/22 2200 SCDs Start: 12/21/22 1933   Antimicrobials: IV Unasyn completed on 10/31. Fluid: None Consultants: None Family Communication: None at bedside  Status: Inpatient Level  of care:  Progressive   Patient is from: Home Needs to continue in-hospital care: Medically stable for discharge.  Pending SNF  Diet:  Diet Order             DIET - DYS 1 Room service appropriate? No; Fluid consistency: Thin  Diet effective now                   Scheduled Meds:  aspirin EC  81 mg Oral Daily   atorvastatin  40 mg Oral QHS   carvedilol  25 mg Oral BID WC   Chlorhexidine Gluconate Cloth  6 each Topical Daily   cyanocobalamin  1,000 mcg Oral Daily   feeding supplement  237 mL Oral BID BM   folic acid  1 mg Oral Daily   free water  200 mL Per Tube Q6H   furosemide  20 mg Oral Daily   heparin  5,000 Units Subcutaneous Q8H   levETIRAcetam  1,000 mg Oral BID   multivitamin with minerals  1 tablet Oral Daily   sodium chloride flush  3 mL Intravenous Q12H   tamsulosin  0.4 mg Oral Daily   thiamine  100 mg Oral Daily    PRN meds: hydrALAZINE, LORazepam, LORazepam, mouth rinse   Infusions:     Antimicrobials: Anti-infectives (From admission, onward)    Start     Dose/Rate Route Frequency Ordered Stop   12/21/22 2115  Ampicillin-Sulbactam (UNASYN) 3 g in sodium chloride 0.9 % 100 mL IVPB        3 g 200 mL/hr over 30 Minutes Intravenous Every 8 hours 12/21/22 2016 12/24/22 1345       Objective: Vitals:   12/29/22 0326 12/29/22 0806  BP: (!) 172/85 (!) 161/77  Pulse: 64 (!) 59  Resp: 16 18  Temp: 98.2 F (36.8 C) 98.8 F (37.1 C)  SpO2: 98% 100%    Intake/Output Summary (Last 24 hours) at 12/29/2022 1134 Last data filed at 12/29/2022 0500 Gross per 24 hour  Intake --  Output 1000 ml  Net -1000 ml   Filed Weights   12/27/22 0500 12/28/22 0344 12/29/22 0500  Weight: 88.5 kg 91.2 kg 88.5 kg   Weight change: -2.7 kg Body mass index is 27.21 kg/m.   Physical Exam: General exam: Elderly African-American male.  Not in pain. Skin: No rashes, lesions or ulcers. HEENT: Atraumatic, normocephalic, no obvious bleeding.  Lungs: Diminished air  entry in both bases CVS: Regular rate and rhythm, no murmur GI/Abd soft, nontender, nondistended, bowel sound present CNS: Alert, awake, oriented to place and person.  Slow to respond. psychiatry: Sad affect Extremities: No pedal edema, no calf tenderness  Data Review: I have personally reviewed the laboratory data and studies available.  F/u labs ordered Unresulted Labs (From admission, onward)    None       Total time spent in review of labs and imaging, patient evaluation, formulation of plan, documentation and communication with family: 25 minutes  Signed, Lorin Glass, MD [facility] 12/29/2022

## 2022-12-29 NOTE — Progress Notes (Signed)
Speech Language Pathology Treatment: Dysphagia  Patient Details Name: John Spears MRN: [id number] DOB: 02-02-1951 Today's Date: 12/29/2022 Time: 7846-9629 SLP Time Calculation (min) (ACUTE ONLY): 8 min  Assessment / Plan / Recommendation Clinical Impression  Pt much more alert and able to better participate in PO trials this date. He reports that he always wears his dentures to eat, but politely declined placing them during session. Observed pt with trials of Dys 3 texture solids and thin liquids with no overt s/s of dysphagia or aspiration. Given pt's improvements in mentation, recommend upgrading diet to Dys 3 and continuing thin liquids with no further SLP f/u necessary. Will s/o at this time.    HPI HPI: AYCE PIETRZYK is a 72 yo male presenting to ED 10/28 s/p fall with seizure at home. On ground ~3 hours prior to call for EMS, unresponsive. CIWA due to ETOH abuse. ETT 10/28-10/29. Cortrak placed 10/30. PMH seizures on Keppra, HTN, L MCA CVA      SLP Plan  All goals met      Recommendations for follow up therapy are one component of a multi-disciplinary discharge planning process, led by the attending physician.  Recommendations may be updated based on patient status, additional functional criteria and insurance authorization.    Recommendations  Diet recommendations: Dysphagia 3 (mechanical soft);Thin liquid Liquids provided via: Cup;Straw Medication Administration: Whole meds with liquid Supervision: Staff to assist with self feeding;Full supervision/cueing for compensatory strategies Compensations: Minimize environmental distractions;Slow rate;Small sips/bites Postural Changes and/or Swallow Maneuvers: Seated upright 90 degrees                  Oral care BID;Staff/trained caregiver to provide oral care   Frequent or constant Supervision/Assistance Dysphagia, unspecified (R13.10)     All goals met     Gwynneth Aliment, M.A., CF-SLP Speech Language Pathology,  Acute Rehabilitation Services  Secure Chat preferred [phone]   12/29/2022, 3:32 PM

## 2022-12-29 NOTE — Progress Notes (Signed)
Physical Therapy Treatment Patient Details Name: John Spears MRN: [id number] DOB: [birth year] Today's Date: 12/29/2022   History of Present Illness 72 yo male s/p fall 10/28 with seizure at home. On the ground ~3 hours prior to call for EMS, unresponsive, SBP 240. CIWA due to ETOH abuse. ETT 10/28-10/29. PMH: seizures on keppra, HTN, L MCA CVA    PT Comments  Pt greeted attempting to exit bed to walk to bathroom without RW support. Reinforced education on RW use for safety with pt agreeable and demonstrating gait with up to min A for safety and steadying assist with RW for support. Pt able to progress gait tolerance this session with cues for upright posture and forward gaze. Pt demonstrating poor recall for safety awareness, abandoning RW on return to room. Current plan remains appropriate to address deficits and maximize functional independence and decrease caregiver burden. Pt continues to benefit from skilled PT services to progress toward functional mobility goals.     If plan is discharge home, recommend the following: Assistance with cooking/housework;Direct supervision/assist for financial management;Direct supervision/assist for medications management;Assist for transportation;Help with stairs or ramp for entrance;A lot of help with walking and/or transfers;A lot of help with bathing/dressing/bathroom   Can travel by private vehicle     No  Equipment Recommendations  Other (comment) (TBA)    Recommendations for Other Services       Precautions / Restrictions Precautions Precautions: Fall Restrictions Weight Bearing Restrictions: No     Mobility  Bed Mobility Overal bed mobility: Needs Assistance             General bed mobility comments: pt EOB on arrival    Transfers Overall transfer level: Needs assistance Equipment used: Rolling walker (2 wheels) Transfers: Sit to/from Stand, Bed to chair/wheelchair/BSC Sit to Stand: Contact guard assist            General transfer comment: CGA for safety, pt attempting to exit bed on arrival without RW support    Ambulation/Gait Ambulation/Gait assistance: Min assist, Contact guard assist Gait Distance (Feet): 15 Feet (+200') Assistive device: Rolling walker (2 wheels) Gait Pattern/deviations: Decreased stride length, Trunk flexed, Step-through pattern Gait velocity: reduced     General Gait Details: min A to steady with cues for upright trunk, fading to CGA with hallway gait, max cues for navigation as pt unable to find room on return   Stairs             Wheelchair Mobility     Tilt Bed    Modified Rankin (Stroke Patients Only) Modified Rankin (Stroke Patients Only) Pre-Morbid Rankin Score: No symptoms Modified Rankin: Moderately severe disability     Balance Overall balance assessment: Needs assistance Sitting-balance support: Feet supported, Bilateral upper extremity supported Sitting balance-Leahy Scale: Fair Sitting balance - Comments: supervision for safety   Standing balance support: Bilateral upper extremity supported, During functional activity, Reliant on assistive device for balance Standing balance-Leahy Scale: Poor Standing balance comment: Reliant on UE support                            Cognition Arousal: Alert Behavior During Therapy: Flat affect Overall Cognitive Status: Impaired/Different from baseline Area of Impairment: Attention, Memory, Following commands, Safety/judgement, Awareness, Problem solving, Orientation                 Orientation Level: Situation Current Attention Level: Focused Memory: Decreased short-term memory, Decreased recall of precautions Following Commands: Follows one step  commands inconsistently, Follows one step commands with increased time Safety/Judgement: Decreased awareness of safety, Decreased awareness of deficits Awareness: Emergent Problem Solving: Slow processing, Decreased initiation, Difficulty  sequencing, Requires verbal cues, Requires tactile cues General Comments: decr awareness of safety, attmepting to abandon RW multiple times and exiting bed without RW on arrival        Exercises      General Comments General comments (skin integrity, edema, etc.): VSS on RA      Pertinent Vitals/Pain Pain Assessment Pain Assessment: No/denies pain    Home Living                          Prior Function            PT Goals (current goals can now be found in the care plan section) Acute Rehab PT Goals Patient Stated Goal: to go to bathroom PT Goal Formulation: With patient Time For Goal Achievement: 01/06/23 Progress towards PT goals: Progressing toward goals    Frequency    Min 1X/week      PT Plan      Co-evaluation              AM-PAC PT "6 Clicks" Mobility   Outcome Measure  Help needed turning from your back to your side while in a flat bed without using bedrails?: A Little Help needed moving from lying on your back to sitting on the side of a flat bed without using bedrails?: A Little Help needed moving to and from a bed to a chair (including a wheelchair)?: A Little Help needed standing up from a chair using your arms (e.g., wheelchair or bedside chair)?: A Little Help needed to walk in hospital room?: A Little Help needed climbing 3-5 steps with a railing? : Total 6 Click Score: 16    End of Session   Activity Tolerance: Patient tolerated treatment well Patient left: in chair;with call bell/phone within reach;with chair alarm set;with nursing/sitter in room Nurse Communication: Mobility status;Other (comment) (pt need for new call bell, unit secretary aware also) PT Visit Diagnosis: Unsteadiness on feet (R26.81);Muscle weakness (generalized) (M62.81);Difficulty in walking, not elsewhere classified (R26.2);Other symptoms and signs involving the nervous system (R29.898);Other abnormalities of gait and mobility (R26.89)     Time:  1440-1455 PT Time Calculation (min) (ACUTE ONLY): 15 min  Charges:    $Gait Training: 8-22 mins PT General Charges $$ ACUTE PT VISIT: 1 Visit                     Lanis Storlie R. PTA Acute Rehabilitation Services Office: [phone]   Catalina Antigua 12/29/2022, 3:27 PM

## 2022-12-30 DIAGNOSIS — R569 Unspecified convulsions: Secondary | ICD-10-CM | POA: Diagnosis not present

## 2022-12-30 MED ORDER — ADULT MULTIVITAMIN W/MINERALS CH: 1.0000 | ORAL_TABLET | Freq: Every day | ORAL | Status: DC

## 2022-12-30 MED ORDER — ENSURE ENLIVE PO LIQD: 237.0000 mL | Freq: Two times a day (BID) | ORAL | Status: AC

## 2022-12-30 NOTE — Hospital Course (Signed)
John Spears is a 72 y.o. male with a history of hypertension, MCA stroke, seizures, alcoholism.  Patient presented secondary to a witnessed generalized tonic-clonic seizure with post-ictal state leading to ICU admission, intubation and mechanical ventilation. Neurology consulted. Patient with improvement of mental status and subsequent extubation. Neurology consulted for management of seizures with resumption of home Keppra. Patient recommended for discharge to SNF.

## 2022-12-30 NOTE — TOC Transition Note (Cosign Needed Addendum)
Transition of Care [facility]) - CM/SW Discharge Note   Patient Details  Name: John Spears MRN: [id number] Date of Birth: 21-Jun-1950  [facility] of Care [facility]) CM/SW Contact:  Srinivas Lippman Felipa Emory, Student-Social Work Phone Number: 12/30/2022, 2:56 PM   Clinical Narrative:   MSW Student received insurance approval for patent to admit to [facility]. MSW Student confirmed with MD that patient is stable for discharge . MSW Student reached out to sister Kathie Rhodes and could not reach her. MSW Student reached out to son John Spears and left voicemail regarding patient discharge. MSW Student confirmed bed available at [facility]. Transport arranged for next available.   Number to call report: [phone] RM.[email or web address]   UPDATE: MSW Student spoke with son John Spears and informed him of patient discharge.    Final next level of care: Skilled Nursing Facility Barriers to Discharge: Continued Medical Work up, English as a second language teacher, SNF Pending bed offer   Patient Goals and CMS Choice      Discharge Placement                Patient chooses bed at: [facility] Patient to be transferred to facility by: [facility]   Patient and family notified of of transfer: 12/30/22  Discharge Plan and Services Additional resources added to the After Visit Summary for   In-house Referral: Clinical Social Work                                   Social Determinants of Health (SDOH) Interventions     Readmission Risk Interventions     No data to display

## 2022-12-30 NOTE — Discharge Instructions (Signed)
John Spears,  You were in the hospital because of a seizure. Your medications have been adjusted. Please follow-up with neurology on discharge.

## 2022-12-30 NOTE — Plan of Care (Signed)
  Problem: Education: Goal: Knowledge of General Education information will improve Description: Including pain rating scale, medication(s)/side effects and non-pharmacologic comfort measures Outcome: Adequate for Discharge   Problem: Health Behavior/Discharge Planning: Goal: Ability to manage health-related needs will improve Outcome: Adequate for Discharge   Problem: Clinical Measurements: Goal: Ability to maintain clinical measurements within normal limits will improve Outcome: Adequate for Discharge Goal: Will remain free from infection Outcome: Adequate for Discharge Goal: Diagnostic test results will improve Outcome: Adequate for Discharge Goal: Cardiovascular complication will be avoided Outcome: Adequate for Discharge   Problem: Activity: Goal: Risk for activity intolerance will decrease Outcome: Adequate for Discharge   Problem: Nutrition: Goal: Adequate nutrition will be maintained Outcome: Adequate for Discharge   Problem: Coping: Goal: Level of anxiety will decrease Outcome: Adequate for Discharge   Problem: Elimination: Goal: Will not experience complications related to bowel motility Outcome: Adequate for Discharge Goal: Will not experience complications related to urinary retention Outcome: Adequate for Discharge   Problem: Pain Management: Goal: General experience of comfort will improve Outcome: Adequate for Discharge   Problem: Safety: Goal: Ability to remain free from injury will improve Outcome: Adequate for Discharge   Problem: Skin Integrity: Goal: Risk for impaired skin integrity will decrease Outcome: Adequate for Discharge

## 2022-12-30 NOTE — Discharge Summary (Signed)
Physician Discharge Summary   Patient: John Spears MRN: [id number] DOB: 1950-03-02  Admit date:     12/21/2022  Discharge date: 12/30/22  Discharge Physician: Jacquelin Hawking   PCP: Patient, No Pcp Per   Recommendations at discharge:  PCP and neurology follow-up  Discharge Diagnoses: Principal Problem:   Seizures (HCC) Active Problems:   Encephalopathy acute   Acute respiratory failure with hypoxia (HCC)   ETOH abuse   Hypophosphatemia   Thrombocytopenia (HCC)   Breakthrough seizure (HCC)   Malnutrition of moderate degree  Resolved Problems:   * No resolved hospital problems. *  Hospital Course: John Spears is a 72 y.o. male with a history of hypertension, MCA stroke, seizures, alcoholism.  Patient presented secondary to a witnessed generalized tonic-clonic seizure with post-ictal state leading to ICU admission, intubation and mechanical ventilation. Neurology consulted. Patient with improvement of mental status and subsequent extubation. Neurology consulted for management of seizures with resumption of home Keppra. Patient recommended for discharge to SNF.  Assessment and Plan:  Seizure Generalized tonic-clonic seizure with post-ictal state leading to ICU admission for intubation and mechanical ventilation. In setting of alcoholism and suspected medication non-adherence. Neurology consulted. Patient restarted on home Keppra.  Acute metabolic encephalopathy Secondary to post-ictal state. Resolved.  Acute respiratory failure with hypoxia Secondary to post-ictal state. Patient intubated on 10/28 and successfully extubated on 10/29.  Todd paralysis Secondary to seizure. Resolved.  Acute alcohol withdrawal Alcohol abuse Patient managed with CIWA during admission. Withdrawal symptoms resolved.  Dysphagia Patient required NG tube for tube feeds secondary to mental status change. NG tube removed. Patient tolerating a Dysphagia 3 diet.  Moderate  malnutrition Continue protein supplementation.  Primary hypertension Continue Coreg and lasix.  History of stroke Continue aspirin and Lipitor.  BPH Continue Flomax.  Chronic anemia Hemoglobin stable. No evidence of acute bleed noted. Recommend outpatient follow-up.  Fall Unwitnessed fall while admitted. CT head without acute change. Continue therapy.   Consultants: General surgery Neurology  Procedures performed:  EEG Intubation/extubation NG tube placement/removal   Disposition: Skilled nursing facility Diet recommendation:  Dysphagia 3 (mechanical soft);Thin liquid Liquids provided via: Cup;Straw Medication Administration: Whole meds with liquid Supervision: Staff to assist with self feeding;Full supervision/cueing for compensatory strategies Compensations: Minimize environmental distractions;Slow rate;Small sips/bites Postural Changes and/or Swallow Maneuvers: Seated upright 90 degrees   DISCHARGE MEDICATION: Allergies as of 12/30/2022   Not on File      Medication List     TAKE these medications    aspirin EC 81 MG tablet Take 81 mg by mouth daily. Swallow whole.   atorvastatin 40 MG tablet Commonly known as: LIPITOR Take 40 mg by mouth at bedtime.   carvedilol 25 MG tablet Commonly known as: COREG Take 25 mg by mouth in the morning and at bedtime.   cyanocobalamin 1000 MCG tablet Commonly known as: VITAMIN B12 Take 1,000 mcg by mouth daily.   feeding supplement Liqd Take 237 mLs by mouth 2 (two) times daily between meals.   folic acid 1 MG tablet Commonly known as: FOLVITE Take 1 mg by mouth daily.   furosemide 20 MG tablet Commonly known as: LASIX Take 20 mg by mouth daily.   levETIRAcetam 1000 MG tablet Commonly known as: KEPPRA Take 1,000 mg by mouth 2 (two) times daily.   magnesium oxide 400 (240 Mg) MG tablet Commonly known as: MAG-OX Take 400 mg by mouth daily.   multivitamin with minerals Tabs tablet Take 1 tablet by  mouth daily. Start taking  on: December 31, 2022   tamsulosin 0.4 MG Caps capsule Commonly known as: FLOMAX Take 0.4 mg by mouth daily.   thiamine 100 MG tablet Commonly known as: Vitamin B-1 Take 100 mg by mouth daily.        Follow-up Information     Lonell Face, MD. Schedule an appointment as soon as possible for a visit in 2 week(s).   Specialty: Neurology Why: For hospital follow-up Contact information: [address] [facility] [place] [address] [phone]                Discharge Exam: BP (!) 140/71 (BP Location: Left Arm)   Pulse 70   Temp 97.6 F (36.4 C) (Oral)   Resp 17   Ht 5\' 11"  (1.803 m)   Wt 88.5 kg   SpO2 100%   BMI 27.21 kg/m   General exam: Appears calm and comfortable Respiratory system: Clear to auscultation. Respiratory effort normal. Cardiovascular system: S1 & S2 heard, RRR. Gastrointestinal system: Abdomen is nondistended, soft and nontender.  Normal bowel sounds heard. Central nervous system: Alert and oriented. No focal neurological deficits. Musculoskeletal: No edema. No calf tenderness Psychiatry: Judgement and insight appear normal. Mood & affect appropriate.   Condition at discharge: stable  The results of significant diagnostics from this hospitalization (including imaging, microbiology, ancillary and laboratory) are listed below for reference.   Imaging Studies: CT HEAD WO CONTRAST ( )  Result Date: 12/26/2022 CLINICAL DATA:  Head trauma, fall EXAM: CT HEAD WITHOUT CONTRAST TECHNIQUE: Contiguous axial images were obtained from the base of the skull through the vertex without intravenous contrast. RADIATION DOSE REDUCTION: This exam was performed according to the departmental dose-optimization program which includes automated exposure control, adjustment of the mA and/or kV according to patient size and/or use of iterative reconstruction technique. COMPARISON:  CT head 12/21/2022 FINDINGS:  Brain: There is no acute intracranial hemorrhage, extra-axial fluid collection, or acute infarct. Parenchymal volume is stable. The ventricles are stable in size. Encephalomalacia in the left temporal and occipital lobes is unchanged consistent with prior infarct. Small remote infarcts in the right caudate head and thalamus are unchanged. The pituitary and suprasellar region are normal. There is no mass lesion. There is no mass effect or midline shift. Vascular: There is calcification of the bilateral carotid siphons. Skull: Normal. Negative for fracture or focal lesion. Sinuses/Orbits: There is mucosal thickening throughout the paranasal sinuses with layering fluid in the left sphenoid sinus which may be related to instrumentation. There is a remote fracture of the right lamina papyracea. The globes and orbits are otherwise unremarkable. Other: The mastoid air cells and middle ear cavities are clear. IMPRESSION: No acute intracranial pathology. Electronically Signed   By: Lesia Hausen M.D.   On: 12/26/2022 13:57   DG Abd Portable 1V  Result Date: 12/23/2022 CLINICAL DATA:  72 year old male feeding tube placement. EXAM: PORTABLE ABDOMEN - 1 VIEW COMPARISON:  CT Abdomen and Pelvis 12/21/2022. FINDINGS: Portable AP semi upright view at 1004 hours. Enteric feeding tube placed and tip is in the right upper quadrant at the level of the gastric antrum or duodenal bulb. Increased but nondilated gas-filled large and small bowel loops compared to 12/21/2022. IMPRESSION: 1. Enteric feeding tube placed and tip in the right abdomen at the level of the gastric antrum or duodenal bulb. 2. Increased nondilated gas-filled bowel loops since 12/21/2022. Consider ileus. Electronically Signed   By: Odessa Fleming M.D.   On: 12/23/2022 12:06   Overnight  EEG with video  Result Date: 12/22/2022 Charlsie Quest, MD     12/22/2022 10:02 AM Patient Name: John Spears MRN: [id number] Epilepsy Attending: Charlsie Quest Referring  Physician/Provider: Caryl Pina, MD Duration: 12/21/2022 2126 to 12/22/2022 1002 Patient history: 72 year old male with breakthrough seizure at home. EEG to evaluate for seizure Level of alertness:  comatose-->awake AEDs during EEG study: LEV, propofol Technical aspects: This EEG study was done with scalp electrodes positioned according to the 10-20 International system of electrode placement. Electrical activity was reviewed with band pass filter of 1-70Hz , sensitivity of 7 uV/mm, display speed of 65mm/sec with a 60Hz  notched filter applied as appropriate. EEG data were recorded continuously and digitally stored.  Video monitoring was available and reviewed as appropriate. Description: EEG initially showed continuous generalized and lateralized left hemisphere 3 to 6 Hz theta-delta slowing admixed with 15 to 18 Hz beta activity distributed symmetrically and diffusely.  On 12/22/2022 after around 8 AM as sedation was weaned, EEG showed posterior dominant rhythm of 8-9 Hz activity of moderate voltage (25-35 uV) seen predominantly in posterior head regions, asymmetric ( left<right) and reactive to eye opening and eye closing.  EEG continued to show continuous 3 to 6 Hz he developed slowing in left hemisphere.  Hyperventilation and photic stimulation were not performed.   ABNORMALITY - Continuous slow, generalized and lateralized left hemisphere IMPRESSION: This study is suggestive of cortical dysfunction arising from left hemisphere likely secondary to underlying structural abnormality, post-ictal state. Additionally there was severe diffuse encephalopathy likely related to sedation which gradually improved as sedation was weaned. No seizures or epileptiform discharges were seen throughout the recording. Charlsie Quest   DG Chest Port 1 View  Result Date: 12/22/2022 CLINICAL DATA:  Respiratory failure EXAM: PORTABLE CHEST 1 VIEW COMPARISON:  Yesterday FINDINGS: Endotracheal tube with tip just below the  clavicular heads. An enteric tube reaches the stomach. Stable generous heart size accentuated by technique. Low volume lungs. There is no edema, visible consolidation, effusion, or pneumothorax. IMPRESSION: Stable hardware and symmetric, clear lungs. Electronically Signed   By: Tiburcio Pea M.D.   On: 12/22/2022 06:34   DG Pelvis Portable  Result Date: 12/21/2022 CLINICAL DATA:  Found unresponsive EXAM: PORTABLE PELVIS 1-2 VIEWS COMPARISON:  None Available. FINDINGS: 2 supine frontal views of the pelvis are obtained. Partial visualization of a right hip hemiarthroplasty in the expected position without signs of acute complication. No evidence of acute fracture. Soft tissues are unremarkable. IMPRESSION: 1. No acute pelvic fracture. Electronically Signed   By: Sharlet Salina M.D.   On: 12/21/2022 21:50   DG Chest Port 1 View  Result Date: 12/21/2022 CLINICAL DATA:  Found unresponsive EXAM: PORTABLE CHEST 1 VIEW COMPARISON:  None Available. FINDINGS: Single frontal view of the chest demonstrates endotracheal tube overlying tracheal air column, tip 1.5 cm above carina. Enteric catheter passes below diaphragm tip and side port projecting over gastric fundus. Cardiac silhouette is unremarkable. Lung volumes are diminished, with crowding of the central vasculature. No airspace disease, effusion, or pneumothorax. No acute bony abnormality. IMPRESSION: 1. Support devices as above. 2. Low lung volumes. Electronically Signed   By: Sharlet Salina M.D.   On: 12/21/2022 21:48   CT HEAD WO CONTRAST  Result Date: 12/21/2022 CLINICAL DATA:  Blunt poly trauma, level 1 trauma, found down, altered mental status. Head trauma, moderate-severe; Polytrauma, blunt EXAM: CT HEAD WITHOUT CONTRAST CT CERVICAL SPINE WITHOUT CONTRAST CT CHEST, ABDOMEN AND PELVIS WITHOUT CONTRAST TECHNIQUE: Contiguous axial  images were obtained from the base of the skull through the vertex without intravenous contrast. Multidetector CT imaging  of the cervical spine was performed without intravenous contrast. Multiplanar CT image reconstructions were also generated. Multidetector CT imaging of the chest, abdomen and pelvis was performed following the standard protocol without IV contrast. RADIATION DOSE REDUCTION: This exam was performed according to the departmental dose-optimization program which includes automated exposure control, adjustment of the mA and/or kV according to patient size and/or use of iterative reconstruction technique. COMPARISON:  None Available. FINDINGS: CT HEAD FINDINGS Brain: Cortical encephalomalacia involving the left temporo occipital cortex in keeping with remote left posterior MCA division cortical infarct. Mild parenchymal volume loss is commensurate with the patient's age. No acute intracranial hemorrhage or infarct. No abnormal mass effect or midline shift. No abnormal intra or extra-axial mass lesion or fluid collection. Mild periventricular white matter changes are present likely reflecting the sequela of small vessel ischemia. Ventricular size is normal. Cerebellum is unremarkable. Vascular: No hyperdense vessel or unexpected calcification. Skull: Normal. Negative for fracture or focal lesion. Sinuses/Orbits: Remote right medial orbital wall fracture with shallow depression. Mild bilateral preseptal soft tissue swelling. Ocular globes are intact in ocular lenses are ortho topically position. The retro-orbital fat is clear bilaterally and the extraocular musculature and optic nerves are unremarkable. Mild layering fluid within the right maxillary sinus and opacification of several right ethmoid air cells. Remaining paranasal sinuses are clear. Other: Mastoid air cells and middle ear cavities are clear. CT CERVICAL FINDINGS Alignment: Normal. Skull base and vertebrae: No acute fracture. No primary bone lesion or focal pathologic process. Soft tissues and spinal canal: No prevertebral fluid or swelling. No visible canal  hematoma. Disc levels: Disc space narrowing and endplate remodeling at C5-6 and to a lesser extent C6-7 in keeping with changes of moderate to severe degenerative disc disease. No high-grade canal stenosis. Moderate severe bilateral neuroforaminal narrowing at C5-6, left greater than right. Other: None CT CHEST FINDINGS Cardiovascular: Cardiac size is within normal limits. No significant coronary artery calcification. No pericardial effusion. Central pulmonary arteries are of normal caliber. There is mild aneurysm the proximal descending thoracic aorta measuring 3.3 cm in greatest dimension. Ascending aorta and distal descending aorta are of normal caliber. No intramural hematoma or dissection. Mediastinum/Nodes: Endotracheal tube 1.5 cm above the carina. Nasogastric tube loops within the stomach with its tip within the gastric fundus. Visualized thyroid is unremarkable. No pathologic thoracic adenopathy. Esophagus is unremarkable. No pneumomediastinum or mediastinal hematoma. Lungs/Pleura: Mild consolidation with the in the dependent, subpleural right lung may reflect changes related to acute infection or contusion. Lungs are otherwise clear. No pneumothorax or pleural effusion. Musculoskeletal: No acute bone abnormality. No lytic or blastic bone lesion. CT ABDOMEN AND PELVIS FINDINGS Hepatobiliary: Moderate hepatic steatosis. No enhancing intrahepatic mass. No intra or extrahepatic biliary ductal dilation. Gallbladder unremarkable. Pancreas: Unremarkable Spleen: Unremarkable Adrenals/Urinary Tract: The adrenal glands are unremarkable. The kidneys are normal in size and position. 3 mm nonobstructing calculus noted within the upper pole the left kidney. The kidneys are otherwise unremarkable. Bladder is partially obscured by streak artifact but the visualized portion is unremarkable. Stomach/Bowel: Stomach is within normal limits. Appendix appears normal. No evidence of bowel wall thickening, distention, or  inflammatory changes. Vascular/Lymphatic: Aortic atherosclerosis. No enlarged abdominal or pelvic lymph nodes. Reproductive: Obscured by streak artifact. Other: No abdominal wall hernia Musculoskeletal: Status post right hip arthroplasty. Osseous structures are diffusely osteopenic. No acute bone abnormality. No lytic or blastic bone lesion.  IMPRESSION: 1. No acute intracranial injury. No calvarial fracture. 2. Remote left posterior MCA division cortical infarct. 3. No acute fracture or listhesis of the cervical spine. 4. Mild bilateral preseptal soft tissue swelling. Ocular globes are intact. 5. Mild consolidation within the dependent, subpleural right lung may reflect changes related to acute infection or contusion. 6. 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 Recommend annual imaging followup by CTA or MRA. This recommendation follows 2010 ACCF/AHA/AATS/ACR/ASA/SCA/SCAI/SIR/STS/SVM Guidelines for the Diagnosis and Management of Patients with Thoracic Aortic Disease. Circulation.2010; 121: W098-J191. Aortic aneurysm NOS (ICD10-I71.9) 7. No acute intra-abdominal injury. 8. Moderate hepatic steatosis. 9. 3 mm nonobstructing left renal calculus. Aortic Atherosclerosis (ICD10-I70.0). These results were called by telephone at the time of interpretation on 12/21/2022 at 7:02 pm to provider Metzinger, MD, who verbally acknowledged these results. Electronically Signed   By: Helyn Numbers M.D.   On: 12/21/2022 19:14   CT CERVICAL SPINE WO CONTRAST  Result Date: 12/21/2022 CLINICAL DATA:  Blunt poly trauma, level 1 trauma, found down, altered mental status. Head trauma, moderate-severe; Polytrauma, blunt EXAM: CT HEAD WITHOUT CONTRAST CT CERVICAL SPINE WITHOUT CONTRAST CT CHEST, ABDOMEN AND PELVIS WITHOUT CONTRAST TECHNIQUE: Contiguous axial images were obtained from the base of the skull through the vertex without intravenous contrast. Multidetector CT imaging of the cervical spine was  performed without intravenous contrast. Multiplanar CT image reconstructions were also generated. Multidetector CT imaging of the chest, abdomen and pelvis was performed following the standard protocol without IV contrast. RADIATION DOSE REDUCTION: This exam was performed according to the departmental dose-optimization program which includes automated exposure control, adjustment of the mA and/or kV according to patient size and/or use of iterative reconstruction technique. COMPARISON:  None Available. FINDINGS: CT HEAD FINDINGS Brain: Cortical encephalomalacia involving the left temporo occipital cortex in keeping with remote left posterior MCA division cortical infarct. Mild parenchymal volume loss is commensurate with the patient's age. No acute intracranial hemorrhage or infarct. No abnormal mass effect or midline shift. No abnormal intra or extra-axial mass lesion or fluid collection. Mild periventricular white matter changes are present likely reflecting the sequela of small vessel ischemia. Ventricular size is normal. Cerebellum is unremarkable. Vascular: No hyperdense vessel or unexpected calcification. Skull: Normal. Negative for fracture or focal lesion. Sinuses/Orbits: Remote right medial orbital wall fracture with shallow depression. Mild bilateral preseptal soft tissue swelling. Ocular globes are intact in ocular lenses are ortho topically position. The retro-orbital fat is clear bilaterally and the extraocular musculature and optic nerves are unremarkable. Mild layering fluid within the right maxillary sinus and opacification of several right ethmoid air cells. Remaining paranasal sinuses are clear. Other: Mastoid air cells and middle ear cavities are clear. CT CERVICAL FINDINGS Alignment: Normal. Skull base and vertebrae: No acute fracture. No primary bone lesion or focal pathologic process. Soft tissues and spinal canal: No prevertebral fluid or swelling. No visible canal hematoma. Disc levels: Disc  space narrowing and endplate remodeling at C5-6 and to a lesser extent C6-7 in keeping with changes of moderate to severe degenerative disc disease. No high-grade canal stenosis. Moderate severe bilateral neuroforaminal narrowing at C5-6, left greater than right. Other: None CT CHEST FINDINGS Cardiovascular: Cardiac size is within normal limits. No significant coronary artery calcification. No pericardial effusion. Central pulmonary arteries are of normal caliber. There is mild aneurysm the proximal descending thoracic aorta measuring 3.3 cm in greatest dimension. Ascending aorta and distal descending aorta are of normal caliber. No intramural hematoma or dissection. Mediastinum/Nodes: Endotracheal  tube 1.5 cm above the carina. Nasogastric tube loops within the stomach with its tip within the gastric fundus. Visualized thyroid is unremarkable. No pathologic thoracic adenopathy. Esophagus is unremarkable. No pneumomediastinum or mediastinal hematoma. Lungs/Pleura: Mild consolidation with the in the dependent, subpleural right lung may reflect changes related to acute infection or contusion. Lungs are otherwise clear. No pneumothorax or pleural effusion. Musculoskeletal: No acute bone abnormality. No lytic or blastic bone lesion. CT ABDOMEN AND PELVIS FINDINGS Hepatobiliary: Moderate hepatic steatosis. No enhancing intrahepatic mass. No intra or extrahepatic biliary ductal dilation. Gallbladder unremarkable. Pancreas: Unremarkable Spleen: Unremarkable Adrenals/Urinary Tract: The adrenal glands are unremarkable. The kidneys are normal in size and position. 3 mm nonobstructing calculus noted within the upper pole the left kidney. The kidneys are otherwise unremarkable. Bladder is partially obscured by streak artifact but the visualized portion is unremarkable. Stomach/Bowel: Stomach is within normal limits. Appendix appears normal. No evidence of bowel wall thickening, distention, or inflammatory changes.  Vascular/Lymphatic: Aortic atherosclerosis. No enlarged abdominal or pelvic lymph nodes. Reproductive: Obscured by streak artifact. Other: No abdominal wall hernia Musculoskeletal: Status post right hip arthroplasty. Osseous structures are diffusely osteopenic. No acute bone abnormality. No lytic or blastic bone lesion. IMPRESSION: 1. No acute intracranial injury. No calvarial fracture. 2. Remote left posterior MCA division cortical infarct. 3. No acute fracture or listhesis of the cervical spine. 4. Mild bilateral preseptal soft tissue swelling. Ocular globes are intact. 5. Mild consolidation within the dependent, subpleural right lung may reflect changes related to acute infection or contusion. 6. 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 Recommend annual imaging followup by CTA or MRA. This recommendation follows 2010 ACCF/AHA/AATS/ACR/ASA/SCA/SCAI/SIR/STS/SVM Guidelines for the Diagnosis and Management of Patients with Thoracic Aortic Disease. Circulation.2010; 121: Z610-R604. Aortic aneurysm NOS (ICD10-I71.9) 7. No acute intra-abdominal injury. 8. Moderate hepatic steatosis. 9. 3 mm nonobstructing left renal calculus. Aortic Atherosclerosis (ICD10-I70.0). These results were called by telephone at the time of interpretation on 12/21/2022 at 7:02 pm to provider Metzinger, MD, who verbally acknowledged these results. Electronically Signed   By: Helyn Numbers M.D.   On: 12/21/2022 19:14   CT CHEST ABDOMEN PELVIS W CONTRAST  Result Date: 12/21/2022 CLINICAL DATA:  Blunt poly trauma, level 1 trauma, found down, altered mental status. Head trauma, moderate-severe; Polytrauma, blunt EXAM: CT HEAD WITHOUT CONTRAST CT CERVICAL SPINE WITHOUT CONTRAST CT CHEST, ABDOMEN AND PELVIS WITHOUT CONTRAST TECHNIQUE: Contiguous axial images were obtained from the base of the skull through the vertex without intravenous contrast. Multidetector CT imaging of the cervical spine was performed without  intravenous contrast. Multiplanar CT image reconstructions were also generated. Multidetector CT imaging of the chest, abdomen and pelvis was performed following the standard protocol without IV contrast. RADIATION DOSE REDUCTION: This exam was performed according to the departmental dose-optimization program which includes automated exposure control, adjustment of the mA and/or kV according to patient size and/or use of iterative reconstruction technique. COMPARISON:  None Available. FINDINGS: CT HEAD FINDINGS Brain: Cortical encephalomalacia involving the left temporo occipital cortex in keeping with remote left posterior MCA division cortical infarct. Mild parenchymal volume loss is commensurate with the patient's age. No acute intracranial hemorrhage or infarct. No abnormal mass effect or midline shift. No abnormal intra or extra-axial mass lesion or fluid collection. Mild periventricular white matter changes are present likely reflecting the sequela of small vessel ischemia. Ventricular size is normal. Cerebellum is unremarkable. Vascular: No hyperdense vessel or unexpected calcification. Skull: Normal. Negative for fracture or focal lesion. Sinuses/Orbits:  Remote right medial orbital wall fracture with shallow depression. Mild bilateral preseptal soft tissue swelling. Ocular globes are intact in ocular lenses are ortho topically position. The retro-orbital fat is clear bilaterally and the extraocular musculature and optic nerves are unremarkable. Mild layering fluid within the right maxillary sinus and opacification of several right ethmoid air cells. Remaining paranasal sinuses are clear. Other: Mastoid air cells and middle ear cavities are clear. CT CERVICAL FINDINGS Alignment: Normal. Skull base and vertebrae: No acute fracture. No primary bone lesion or focal pathologic process. Soft tissues and spinal canal: No prevertebral fluid or swelling. No visible canal hematoma. Disc levels: Disc space narrowing  and endplate remodeling at C5-6 and to a lesser extent C6-7 in keeping with changes of moderate to severe degenerative disc disease. No high-grade canal stenosis. Moderate severe bilateral neuroforaminal narrowing at C5-6, left greater than right. Other: None CT CHEST FINDINGS Cardiovascular: Cardiac size is within normal limits. No significant coronary artery calcification. No pericardial effusion. Central pulmonary arteries are of normal caliber. There is mild aneurysm the proximal descending thoracic aorta measuring 3.3 cm in greatest dimension. Ascending aorta and distal descending aorta are of normal caliber. No intramural hematoma or dissection. Mediastinum/Nodes: Endotracheal tube 1.5 cm above the carina. Nasogastric tube loops within the stomach with its tip within the gastric fundus. Visualized thyroid is unremarkable. No pathologic thoracic adenopathy. Esophagus is unremarkable. No pneumomediastinum or mediastinal hematoma. Lungs/Pleura: Mild consolidation with the in the dependent, subpleural right lung may reflect changes related to acute infection or contusion. Lungs are otherwise clear. No pneumothorax or pleural effusion. Musculoskeletal: No acute bone abnormality. No lytic or blastic bone lesion. CT ABDOMEN AND PELVIS FINDINGS Hepatobiliary: Moderate hepatic steatosis. No enhancing intrahepatic mass. No intra or extrahepatic biliary ductal dilation. Gallbladder unremarkable. Pancreas: Unremarkable Spleen: Unremarkable Adrenals/Urinary Tract: The adrenal glands are unremarkable. The kidneys are normal in size and position. 3 mm nonobstructing calculus noted within the upper pole the left kidney. The kidneys are otherwise unremarkable. Bladder is partially obscured by streak artifact but the visualized portion is unremarkable. Stomach/Bowel: Stomach is within normal limits. Appendix appears normal. No evidence of bowel wall thickening, distention, or inflammatory changes. Vascular/Lymphatic: Aortic  atherosclerosis. No enlarged abdominal or pelvic lymph nodes. Reproductive: Obscured by streak artifact. Other: No abdominal wall hernia Musculoskeletal: Status post right hip arthroplasty. Osseous structures are diffusely osteopenic. No acute bone abnormality. No lytic or blastic bone lesion. IMPRESSION: 1. No acute intracranial injury. No calvarial fracture. 2. Remote left posterior MCA division cortical infarct. 3. No acute fracture or listhesis of the cervical spine. 4. Mild bilateral preseptal soft tissue swelling. Ocular globes are intact. 5. Mild consolidation within the dependent, subpleural right lung may reflect changes related to acute infection or contusion. 6. 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 Recommend annual imaging followup by CTA or MRA. This recommendation follows 2010 ACCF/AHA/AATS/ACR/ASA/SCA/SCAI/SIR/STS/SVM Guidelines for the Diagnosis and Management of Patients with Thoracic Aortic Disease. Circulation.2010; 121: Q259-D638. Aortic aneurysm NOS (ICD10-I71.9) 7. No acute intra-abdominal injury. 8. Moderate hepatic steatosis. 9. 3 mm nonobstructing left renal calculus. Aortic Atherosclerosis (ICD10-I70.0). These results were called by telephone at the time of interpretation on 12/21/2022 at 7:02 pm to provider Metzinger, MD, who verbally acknowledged these results. Electronically Signed   By: Helyn Numbers M.D.   On: 12/21/2022 19:14    Microbiology: Results for orders placed or performed during the hospital encounter of 12/21/22  Culture, blood (Routine X 2) w Reflex to ID Panel  Status: None   Collection Time: 12/21/22  9:13 PM   Specimen: BLOOD  Result Value Ref Range Status   Specimen Description BLOOD SITE NOT SPECIFIED  Final   Special Requests   Final    BOTTLES DRAWN AEROBIC AND ANAEROBIC Blood Culture results may not be optimal due to an excessive volume of blood received in culture bottles   Culture   Final    NO GROWTH 5  DAYS Performed at [facility], [address], [place], [place] [address]    Report Status 12/26/2022 FINAL  Final  Culture, blood (Routine X 2) w Reflex to ID Panel     Status: None   Collection Time: 12/21/22  9:13 PM   Specimen: BLOOD  Result Value Ref Range Status   Specimen Description BLOOD SITE NOT SPECIFIED  Final   Special Requests   Final    BOTTLES DRAWN AEROBIC AND ANAEROBIC Blood Culture results may not be optimal due to an excessive volume of blood received in culture bottles   Culture   Final    NO GROWTH 5 DAYS Performed at [facility], [address], [place], [place] [address]    Report Status 12/26/2022 FINAL  Final  MRSA Next Gen by PCR, Nasal     Status: None   Collection Time: 12/21/22 10:20 PM   Specimen: Nasal Mucosa; Nasal Swab  Result Value Ref Range Status   MRSA by PCR Next Gen NOT DETECTED NOT DETECTED Final    Comment: (NOTE) The GeneXpert MRSA Assay (FDA approved for NASAL specimens only), is one component of a comprehensive MRSA colonization surveillance program. It is not intended to diagnose MRSA infection nor to guide or monitor treatment for MRSA infections. Test performance is not FDA approved in patients less than 47 years old. Performed at [facility], [address], [place], [place] [address]     Labs: CBC: Recent Labs  Lab 12/24/22 0656 12/26/22 1128 12/29/22 0456  WBC 7.6 6.5 4.9  NEUTROABS  --   --  1.9  HGB 9.8* 8.9* 8.8*  HCT 30.4* 26.7* 27.1*  MCV 92.1 91.4 92.5  PLT 90* 101* 140*   Basic Metabolic Panel: Recent Labs  Lab 12/24/22 0656 12/24/22 1814 12/25/22 0515 12/25/22 1748 12/26/22 1128 12/29/22 0456 12/29/22 0809  NA 139  --   --  138 136 138  --   K 3.4*  --   --  3.8 3.7 3.9  --   CL 108  --   --  109 105 105  --   CO2 22  --   --  23 25 27   --   GLUCOSE 96  --   --  113* 121* 91  --   BUN 13  --   --  21 19 20   --   CREATININE 1.09  --   --  1.11 1.01 1.12  --   CALCIUM 8.8*  --    --  9.2 8.5* 9.3  --   MG 1.6* 3.3* 2.5* 2.0 1.8  --  1.7  PHOS 2.9 2.3* 2.0* 1.5* 2.5  --  3.5    CBG: Recent Labs  Lab 12/27/22 1946 12/27/22 2309 12/28/22 0348 12/28/22 0750 12/28/22 1209  GLUCAP 96 91 88 91 96    Discharge time spent: 35 minutes.  Signed: Jacquelin Hawking, MD [facility] 12/30/2022

## 2023-01-26 ENCOUNTER — Emergency Department (HOSPITAL_COMMUNITY): Payer: 59

## 2023-01-26 ENCOUNTER — Inpatient Hospital Stay (HOSPITAL_COMMUNITY)
Admission: EM | Admit: 2023-01-26 | Discharge: 2023-02-10 | DRG: 100 | Disposition: A | Discharge status: Skilled Nursing Facility | Payer: 59 | Attending: Internal Medicine | Admitting: Internal Medicine

## 2023-01-26 ENCOUNTER — Inpatient Hospital Stay (HOSPITAL_COMMUNITY): Payer: 59

## 2023-01-26 ENCOUNTER — Other Ambulatory Visit: Payer: Self-pay

## 2023-01-26 ENCOUNTER — Encounter (HOSPITAL_COMMUNITY): Payer: Self-pay | Admitting: *Deleted

## 2023-01-26 DIAGNOSIS — Y92019 Unspecified place in single-family (private) house as the place of occurrence of the external cause: Secondary | ICD-10-CM

## 2023-01-26 DIAGNOSIS — D696 Thrombocytopenia, unspecified: Secondary | ICD-10-CM | POA: Diagnosis present

## 2023-01-26 DIAGNOSIS — I5022 Chronic systolic (congestive) heart failure: Secondary | ICD-10-CM | POA: Diagnosis present

## 2023-01-26 DIAGNOSIS — I69320 Aphasia following cerebral infarction: Secondary | ICD-10-CM

## 2023-01-26 DIAGNOSIS — G40901 Epilepsy, unspecified, not intractable, with status epilepticus: Secondary | ICD-10-CM | POA: Diagnosis present

## 2023-01-26 DIAGNOSIS — E162 Hypoglycemia, unspecified: Secondary | ICD-10-CM | POA: Diagnosis present

## 2023-01-26 DIAGNOSIS — I110 Hypertensive heart disease with heart failure: Secondary | ICD-10-CM | POA: Diagnosis present

## 2023-01-26 DIAGNOSIS — E870 Hyperosmolality and hypernatremia: Secondary | ICD-10-CM | POA: Insufficient documentation

## 2023-01-26 DIAGNOSIS — I639 Cerebral infarction, unspecified: Secondary | ICD-10-CM | POA: Diagnosis not present

## 2023-01-26 DIAGNOSIS — E785 Hyperlipidemia, unspecified: Secondary | ICD-10-CM | POA: Diagnosis present

## 2023-01-26 DIAGNOSIS — F1020 Alcohol dependence, uncomplicated: Secondary | ICD-10-CM | POA: Diagnosis present

## 2023-01-26 DIAGNOSIS — I480 Paroxysmal atrial fibrillation: Secondary | ICD-10-CM | POA: Diagnosis not present

## 2023-01-26 DIAGNOSIS — Z87891 Personal history of nicotine dependence: Secondary | ICD-10-CM

## 2023-01-26 DIAGNOSIS — I5020 Unspecified systolic (congestive) heart failure: Secondary | ICD-10-CM | POA: Diagnosis not present

## 2023-01-26 DIAGNOSIS — I4510 Unspecified right bundle-branch block: Secondary | ICD-10-CM | POA: Diagnosis present

## 2023-01-26 DIAGNOSIS — K7689 Other specified diseases of liver: Secondary | ICD-10-CM | POA: Diagnosis present

## 2023-01-26 DIAGNOSIS — R197 Diarrhea, unspecified: Secondary | ICD-10-CM | POA: Insufficient documentation

## 2023-01-26 DIAGNOSIS — G8191 Hemiplegia, unspecified affecting right dominant side: Secondary | ICD-10-CM | POA: Diagnosis present

## 2023-01-26 DIAGNOSIS — R569 Unspecified convulsions: Secondary | ICD-10-CM

## 2023-01-26 DIAGNOSIS — Z781 Physical restraint status: Secondary | ICD-10-CM | POA: Diagnosis not present

## 2023-01-26 DIAGNOSIS — J690 Pneumonitis due to inhalation of food and vomit: Secondary | ICD-10-CM | POA: Diagnosis present

## 2023-01-26 DIAGNOSIS — G40909 Epilepsy, unspecified, not intractable, without status epilepticus: Principal | ICD-10-CM

## 2023-01-26 DIAGNOSIS — G40919 Epilepsy, unspecified, intractable, without status epilepticus: Secondary | ICD-10-CM | POA: Diagnosis not present

## 2023-01-26 DIAGNOSIS — G9341 Metabolic encephalopathy: Secondary | ICD-10-CM | POA: Diagnosis not present

## 2023-01-26 DIAGNOSIS — I420 Dilated cardiomyopathy: Secondary | ICD-10-CM | POA: Diagnosis present

## 2023-01-26 DIAGNOSIS — Z8673 Personal history of transient ischemic attack (TIA), and cerebral infarction without residual deficits: Secondary | ICD-10-CM | POA: Diagnosis present

## 2023-01-26 DIAGNOSIS — W1830XA Fall on same level, unspecified, initial encounter: Secondary | ICD-10-CM | POA: Diagnosis present

## 2023-01-26 DIAGNOSIS — J9602 Acute respiratory failure with hypercapnia: Secondary | ICD-10-CM | POA: Diagnosis not present

## 2023-01-26 DIAGNOSIS — R4182 Altered mental status, unspecified: Principal | ICD-10-CM | POA: Diagnosis present

## 2023-01-26 DIAGNOSIS — I509 Heart failure, unspecified: Secondary | ICD-10-CM | POA: Diagnosis not present

## 2023-01-26 DIAGNOSIS — R001 Bradycardia, unspecified: Secondary | ICD-10-CM | POA: Diagnosis not present

## 2023-01-26 DIAGNOSIS — N179 Acute kidney failure, unspecified: Secondary | ICD-10-CM | POA: Diagnosis present

## 2023-01-26 DIAGNOSIS — G9340 Encephalopathy, unspecified: Secondary | ICD-10-CM | POA: Diagnosis not present

## 2023-01-26 DIAGNOSIS — G928 Other toxic encephalopathy: Secondary | ICD-10-CM | POA: Diagnosis present

## 2023-01-26 DIAGNOSIS — F10939 Alcohol use, unspecified with withdrawal, unspecified: Secondary | ICD-10-CM | POA: Diagnosis not present

## 2023-01-26 DIAGNOSIS — E860 Dehydration: Secondary | ICD-10-CM | POA: Diagnosis present

## 2023-01-26 DIAGNOSIS — J9601 Acute respiratory failure with hypoxia: Secondary | ICD-10-CM | POA: Diagnosis present

## 2023-01-26 DIAGNOSIS — Z79899 Other long term (current) drug therapy: Secondary | ICD-10-CM

## 2023-01-26 DIAGNOSIS — R451 Restlessness and agitation: Secondary | ICD-10-CM | POA: Diagnosis not present

## 2023-01-26 DIAGNOSIS — Z8249 Family history of ischemic heart disease and other diseases of the circulatory system: Secondary | ICD-10-CM

## 2023-01-26 DIAGNOSIS — I10 Essential (primary) hypertension: Secondary | ICD-10-CM | POA: Diagnosis not present

## 2023-01-26 DIAGNOSIS — F1010 Alcohol abuse, uncomplicated: Secondary | ICD-10-CM | POA: Diagnosis present

## 2023-01-26 DIAGNOSIS — Z7982 Long term (current) use of aspirin: Secondary | ICD-10-CM

## 2023-01-26 DIAGNOSIS — R471 Dysarthria and anarthria: Secondary | ICD-10-CM | POA: Diagnosis not present

## 2023-01-26 DIAGNOSIS — F10931 Alcohol use, unspecified with withdrawal delirium: Secondary | ICD-10-CM | POA: Diagnosis not present

## 2023-01-26 DIAGNOSIS — F1090 Alcohol use, unspecified, uncomplicated: Secondary | ICD-10-CM | POA: Diagnosis present

## 2023-01-26 DIAGNOSIS — T426X6A Underdosing of other antiepileptic and sedative-hypnotic drugs, initial encounter: Secondary | ICD-10-CM | POA: Diagnosis present

## 2023-01-26 LAB — CBC
HCT: 37.8 % — ABNORMAL LOW (ref 39.0–52.0)
Hemoglobin: 11.9 g/dL — ABNORMAL LOW (ref 13.0–17.0)
MCH: 29.5 pg (ref 26.0–34.0)
MCHC: 31.5 g/dL (ref 30.0–36.0)
MCV: 93.6 fL (ref 80.0–100.0)
Platelets: 144 10*3/uL — ABNORMAL LOW (ref 150–400)
RBC: 4.04 MIL/uL — ABNORMAL LOW (ref 4.22–5.81)
RDW: 14.1 % (ref 11.5–15.5)
WBC: 7.7 10*3/uL (ref 4.0–10.5)
nRBC: 0 % (ref 0.0–0.2)

## 2023-01-26 LAB — DIFFERENTIAL
Abs Immature Granulocytes: 0.02 10*3/uL (ref 0.00–0.07)
Basophils Absolute: 0.1 10*3/uL (ref 0.0–0.1)
Basophils Relative: 1 %
Eosinophils Absolute: 0.1 10*3/uL (ref 0.0–0.5)
Eosinophils Relative: 2 %
Immature Granulocytes: 0 %
Lymphocytes Relative: 20 %
Lymphs Abs: 1.6 10*3/uL (ref 0.7–4.0)
Monocytes Absolute: 0.7 10*3/uL (ref 0.1–1.0)
Monocytes Relative: 9 %
Neutro Abs: 5.2 10*3/uL (ref 1.7–7.7)
Neutrophils Relative %: 68 %

## 2023-01-26 LAB — CBG MONITORING, ED
Glucose-Capillary: 139 mg/dL — ABNORMAL HIGH (ref 70–99)
Glucose-Capillary: 60 mg/dL — ABNORMAL LOW (ref 70–99)

## 2023-01-26 LAB — COMPREHENSIVE METABOLIC PANEL
ALT: 12 U/L (ref 0–44)
AST: 28 U/L (ref 15–41)
Albumin: 3.4 g/dL — ABNORMAL LOW (ref 3.5–5.0)
Alkaline Phosphatase: 63 U/L (ref 38–126)
Anion gap: 14 (ref 5–15)
BUN: 20 mg/dL (ref 8–23)
CO2: 18 mmol/L — ABNORMAL LOW (ref 22–32)
Calcium: 8.9 mg/dL (ref 8.9–10.3)
Chloride: 106 mmol/L (ref 98–111)
Creatinine, Ser: 1.38 mg/dL — ABNORMAL HIGH (ref 0.61–1.24)
GFR, Estimated: 55 mL/min — ABNORMAL LOW (ref >60)
Glucose, Bld: 85 mg/dL (ref 70–99)
Potassium: 4.2 mmol/L (ref 3.5–5.1)
Sodium: 138 mmol/L (ref 135–145)
Total Bilirubin: 0.6 mg/dL (ref <1.2)
Total Protein: 6.4 g/dL — ABNORMAL LOW (ref 6.5–8.1)

## 2023-01-26 LAB — LACTIC ACID, PLASMA
Lactic Acid, Venous: 1.3 mmol/L (ref 0.5–1.9)
Lactic Acid, Venous: 1.2 mmol/L (ref 0.5–1.9)

## 2023-01-26 LAB — I-STAT CHEM 8, ED
BUN: 21 mg/dL (ref 8–23)
Calcium, Ion: 1.02 mmol/L — ABNORMAL LOW (ref 1.15–1.40)
Chloride: 111 mmol/L (ref 98–111)
Creatinine, Ser: 1.3 mg/dL — ABNORMAL HIGH (ref 0.61–1.24)
Glucose, Bld: 86 mg/dL (ref 70–99)
HCT: 39 % (ref 39.0–52.0)
Hemoglobin: 13.3 g/dL (ref 13.0–17.0)
Potassium: 4.2 mmol/L (ref 3.5–5.1)
Sodium: 140 mmol/L (ref 135–145)
TCO2: 18 mmol/L — ABNORMAL LOW (ref 22–32)

## 2023-01-26 LAB — GLUCOSE, CAPILLARY
Glucose-Capillary: 117 mg/dL — ABNORMAL HIGH (ref 70–99)
Glucose-Capillary: 93 mg/dL (ref 70–99)
Glucose-Capillary: 78 mg/dL (ref 70–99)
Glucose-Capillary: 80 mg/dL (ref 70–99)

## 2023-01-26 LAB — POCT I-STAT 7, (LYTES, BLD GAS, ICA,H+H)
Acid-base deficit: 5 mmol/L — ABNORMAL HIGH (ref 0.0–2.0)
Bicarbonate: 21 mmol/L (ref 20.0–28.0)
Calcium, Ion: 1.2 mmol/L (ref 1.15–1.40)
HCT: 34 % — ABNORMAL LOW (ref 39.0–52.0)
Hemoglobin: 11.6 g/dL — ABNORMAL LOW (ref 13.0–17.0)
O2 Saturation: 100 %
Patient temperature: 96.8
Potassium: 4 mmol/L (ref 3.5–5.1)
Sodium: 140 mmol/L (ref 135–145)
TCO2: 22 mmol/L (ref 22–32)
pCO2 arterial: 37.9 mm[Hg] (ref 32–48)
pH, Arterial: 7.348 — ABNORMAL LOW (ref 7.35–7.45)
pO2, Arterial: 433 mm[Hg] — ABNORMAL HIGH (ref 83–108)

## 2023-01-26 LAB — HEMOGLOBIN A1C
Hgb A1c MFr Bld: 5.3 % (ref 4.8–5.6)
Mean Plasma Glucose: 105.41 mg/dL

## 2023-01-26 LAB — MRSA NEXT GEN BY PCR, NASAL: MRSA by PCR Next Gen: NOT DETECTED

## 2023-01-26 LAB — PROTIME-INR
INR: 1.1 (ref 0.8–1.2)
Prothrombin Time: 14 s (ref 11.4–15.2)

## 2023-01-26 LAB — APTT: aPTT: 24 s (ref 24–36)

## 2023-01-26 LAB — MAGNESIUM: Magnesium: 1.6 mg/dL — ABNORMAL LOW (ref 1.7–2.4)

## 2023-01-26 LAB — ETHANOL: Alcohol, Ethyl (B): <10 mg/dL (ref <10)

## 2023-01-26 LAB — CK: Total CK: 45 U/L — ABNORMAL LOW (ref 49–397)

## 2023-01-26 MED ORDER — ETOMIDATE 2 MG/ML IV SOLN
20.0000 mg | Freq: Once | INTRAVENOUS | Status: AC
  Administered 2023-01-26: 20 mg via INTRAVENOUS

## 2023-01-26 MED ORDER — PROPOFOL 1000 MG/100ML IV EMUL
5.0000 ug/kg/min | INTRAVENOUS | Status: DC
  Administered 2023-01-26: 10 ug/kg/min via INTRAVENOUS

## 2023-01-26 MED ORDER — POLYETHYLENE GLYCOL 3350 17 G PO PACK: 17.0000 g | PACK | Freq: Every day | ORAL | Status: DC | PRN

## 2023-01-26 MED ORDER — ROCURONIUM BROMIDE 10 MG/ML (PF) SYRINGE
100.0000 mg | PREFILLED_SYRINGE | Freq: Once | INTRAVENOUS | Status: AC
  Administered 2023-01-26: 100 mg via INTRAVENOUS

## 2023-01-26 MED ORDER — FENTANYL BOLUS VIA INFUSION: 25.0000 ug | INTRAVENOUS | Status: DC | PRN

## 2023-01-26 MED ORDER — LORAZEPAM 2 MG/ML IJ SOLN
INTRAMUSCULAR | Status: AC
  Administered 2023-01-26: 2 mg via INTRAVENOUS
  Filled 2023-01-26: qty 1

## 2023-01-26 MED ORDER — FOLIC ACID 1 MG PO TABS
1.0000 mg | ORAL_TABLET | Freq: Every day | ORAL | Status: DC
  Administered 2023-01-27 – 2023-02-03 (×8): 1 mg
  Filled 2023-01-26 (×8): qty 1

## 2023-01-26 MED ORDER — MAGNESIUM SULFATE 2 GM/50ML IV SOLN
2.0000 g | Freq: Once | INTRAVENOUS | Status: AC
  Administered 2023-01-26: 2 g via INTRAVENOUS
  Filled 2023-01-26: qty 50

## 2023-01-26 MED ORDER — PROPOFOL 1000 MG/100ML IV EMUL
0.0000 ug/kg/min | INTRAVENOUS | Status: DC
  Administered 2023-01-26 (×2): 30 ug/kg/min via INTRAVENOUS
  Administered 2023-01-27: 20 ug/kg/min via INTRAVENOUS
  Administered 2023-01-27: 30 ug/kg/min via INTRAVENOUS
  Filled 2023-01-26 (×3): qty 100

## 2023-01-26 MED ORDER — LEVETIRACETAM IN NACL 1000 MG/100ML IV SOLN
1000.0000 mg | Freq: Once | INTRAVENOUS | Status: AC
  Administered 2023-01-26: 1000 mg via INTRAVENOUS

## 2023-01-26 MED ORDER — ADULT MULTIVITAMIN W/MINERALS CH: 1.0000 | ORAL_TABLET | Freq: Every day | ORAL | Status: DC

## 2023-01-26 MED ORDER — MIDAZOLAM BOLUS VIA INFUSION
16.0000 mg | INTRAVENOUS | Status: DC | PRN
  Administered 2023-01-28: 16 mg via INTRAVENOUS

## 2023-01-26 MED ORDER — THIAMINE HCL 100 MG PO TABS: 100.0000 mg | ORAL_TABLET | Freq: Every day | ORAL | Status: DC

## 2023-01-26 MED ORDER — THIAMINE HCL 100 MG/ML IJ SOLN
250.0000 mg | Freq: Every day | INTRAVENOUS | Status: AC
  Administered 2023-01-29 – 2023-02-03 (×6): 250 mg via INTRAVENOUS
  Filled 2023-01-26 (×6): qty 2.5

## 2023-01-26 MED ORDER — INSULIN ASPART 100 UNIT/ML IJ SOLN
0.0000 [IU] | INTRAMUSCULAR | Status: DC
  Administered 2023-01-28 – 2023-01-30 (×4): 1 [IU] via SUBCUTANEOUS

## 2023-01-26 MED ORDER — ORAL CARE MOUTH RINSE: 15.0000 mL | OROMUCOSAL | Status: DC | PRN

## 2023-01-26 MED ORDER — LEVETIRACETAM IN NACL 1500 MG/100ML IV SOLN
1500.0000 mg | Freq: Once | INTRAVENOUS | Status: AC
  Administered 2023-01-26: 1500 mg via INTRAVENOUS
  Filled 2023-01-26: qty 100

## 2023-01-26 MED ORDER — ADULT MULTIVITAMIN W/MINERALS CH
1.0000 | ORAL_TABLET | Freq: Every day | ORAL | Status: DC
  Administered 2023-01-27 – 2023-02-03 (×8): 1
  Filled 2023-01-26 (×8): qty 1

## 2023-01-26 MED ORDER — ENOXAPARIN SODIUM 40 MG/0.4ML IJ SOSY
40.0000 mg | PREFILLED_SYRINGE | INTRAMUSCULAR | Status: DC
  Administered 2023-01-26 – 2023-02-07 (×13): 40 mg via SUBCUTANEOUS
  Filled 2023-01-26 (×13): qty 0.4

## 2023-01-26 MED ORDER — FAMOTIDINE 20 MG PO TABS
20.0000 mg | ORAL_TABLET | Freq: Two times a day (BID) | ORAL | Status: DC
  Administered 2023-01-26 – 2023-02-03 (×17): 20 mg
  Filled 2023-01-26 (×17): qty 1

## 2023-01-26 MED ORDER — MIDAZOLAM HCL 2 MG/2ML IJ SOLN
2.0000 mg | INTRAMUSCULAR | Status: DC | PRN
  Administered 2023-01-26: 2 mg via INTRAVENOUS
  Filled 2023-01-26: qty 2

## 2023-01-26 MED ORDER — FENTANYL 2500MCG IN NS 250ML (10MCG/ML) PREMIX INFUSION
25.0000 ug/h | INTRAVENOUS | Status: DC
  Administered 2023-01-26 (×2): 75 ug/h via INTRAVENOUS
  Filled 2023-01-26 (×3): qty 250

## 2023-01-26 MED ORDER — ATORVASTATIN CALCIUM 40 MG PO TABS
40.0000 mg | ORAL_TABLET | Freq: Every day | ORAL | Status: DC
  Administered 2023-01-26 – 2023-02-03 (×9): 40 mg
  Filled 2023-01-26 (×8): qty 1

## 2023-01-26 MED ORDER — ATORVASTATIN CALCIUM 40 MG PO TABS
40.0000 mg | ORAL_TABLET | Freq: Every day | ORAL | Status: DC
  Filled 2023-01-26: qty 1

## 2023-01-26 MED ORDER — LORAZEPAM 2 MG/ML IJ SOLN: 2.0000 mg | Freq: Once | INTRAMUSCULAR | Status: AC

## 2023-01-26 MED ORDER — CHLORHEXIDINE GLUCONATE CLOTH 2 % EX PADS
6.0000 | MEDICATED_PAD | Freq: Every day | CUTANEOUS | Status: DC
  Administered 2023-01-26 – 2023-02-05 (×11): 6 via TOPICAL

## 2023-01-26 MED ORDER — MIDAZOLAM-SODIUM CHLORIDE 100-0.9 MG/100ML-% IV SOLN
0.0000 mg/h | INTRAVENOUS | Status: DC
  Administered 2023-01-26 (×2): 8 mg/h via INTRAVENOUS
  Administered 2023-01-27: 2 mg/h via INTRAVENOUS
  Administered 2023-01-28 – 2023-01-29 (×4): 16 mg/h via INTRAVENOUS
  Administered 2023-01-29: 10 mg/h via INTRAVENOUS
  Administered 2023-01-30: 5 mg/h via INTRAVENOUS
  Filled 2023-01-26 (×9): qty 100

## 2023-01-26 MED ORDER — THIAMINE HCL 100 MG/ML IJ SOLN
500.0000 mg | Freq: Three times a day (TID) | INTRAVENOUS | Status: AC
  Administered 2023-01-26 – 2023-01-29 (×9): 500 mg via INTRAVENOUS
  Filled 2023-01-26 (×9): qty 5

## 2023-01-26 MED ORDER — FENTANYL 2500MCG IN NS 250ML (10MCG/ML) PREMIX INFUSION
25.0000 ug/h | INTRAVENOUS | Status: DC
Start: 2023-01-26 — End: 2023-01-26
  Administered 2023-01-26: 25 ug/h via INTRAVENOUS
  Filled 2023-01-26: qty 250

## 2023-01-26 MED ORDER — LEVETIRACETAM IN NACL 1000 MG/100ML IV SOLN
1000.0000 mg | Freq: Two times a day (BID) | INTRAVENOUS | Status: DC
  Administered 2023-01-26 – 2023-02-04 (×18): 1000 mg via INTRAVENOUS
  Filled 2023-01-26 (×18): qty 100

## 2023-01-26 MED ORDER — THIAMINE HCL 100 MG/ML IJ SOLN
100.0000 mg | Freq: Every day | INTRAMUSCULAR | Status: DC
  Administered 2023-02-04 – 2023-02-06 (×3): 100 mg via INTRAVENOUS
  Filled 2023-01-26 (×3): qty 2

## 2023-01-26 MED ORDER — DEXTROSE 50 % IV SOLN
25.0000 g | Freq: Once | INTRAVENOUS | Status: AC
  Administered 2023-01-26: 25 g via INTRAVENOUS

## 2023-01-26 MED ORDER — DOCUSATE SODIUM 50 MG/5ML PO LIQD
100.0000 mg | Freq: Two times a day (BID) | ORAL | Status: DC | PRN
  Administered 2023-01-30: 100 mg
  Filled 2023-01-26: qty 10

## 2023-01-26 MED ORDER — SODIUM CHLORIDE 0.9 % IV SOLN
3.0000 g | Freq: Four times a day (QID) | INTRAVENOUS | Status: AC
  Administered 2023-01-26 – 2023-01-30 (×18): 3 g via INTRAVENOUS
  Filled 2023-01-26 (×18): qty 8

## 2023-01-26 MED ORDER — DOCUSATE SODIUM 100 MG PO CAPS: 100.0000 mg | ORAL_CAPSULE | Freq: Two times a day (BID) | ORAL | Status: DC | PRN

## 2023-01-26 MED ORDER — FENTANYL CITRATE PF 50 MCG/ML IJ SOSY: 25.0000 ug | PREFILLED_SYRINGE | Freq: Once | INTRAMUSCULAR | Status: DC

## 2023-01-26 MED ORDER — FOLIC ACID 1 MG PO TABS: 1.0000 mg | ORAL_TABLET | Freq: Every day | ORAL | Status: DC

## 2023-01-26 MED ORDER — ORAL CARE MOUTH RINSE
15.0000 mL | OROMUCOSAL | Status: DC
  Administered 2023-01-26 – 2023-02-03 (×94): 15 mL via OROMUCOSAL

## 2023-01-26 NOTE — ED Notes (Signed)
PCCM at BS ?

## 2023-01-26 NOTE — ED Notes (Signed)
To CT

## 2023-01-26 NOTE — ED Notes (Signed)
Dosing for keppra continues/ infusing

## 2023-01-26 NOTE — ED Notes (Signed)
Beginning CT, neuro MD present

## 2023-01-26 NOTE — ED Triage Notes (Signed)
BIB [facility] from home. Family reported pt was alert and interactive earlier. "Pt was unresponsive with GCS 3, and protecting airway" for EMS. Found on floor, no apparent fall or signs of injury or trauma, no blood thinners. EMS noted sz activity, R sided tremor, gaze L. Versed 5mg  given IM,but sz did not break. Subsequently versed 2.5mg  given IV, and sz abated. Arrives obtunded and sedated, GCS 3, no sz activity. Full Code. EDP present on arrival. Preparing to intubate. RT present.

## 2023-01-26 NOTE — ED Notes (Signed)
Stroke RN at [facility]. EEG arriving to [facility]. No changes, VSS. Tolerating and breathing with vent.

## 2023-01-26 NOTE — ED Notes (Addendum)
Xray at [facility], Stroke team at [facility], neuro MD at [facility]

## 2023-01-26 NOTE — H&P (Signed)
NAME:  John Spears, MRN:  [id number], DOB:  [birth year], LOS: 0 ADMISSION DATE:  01/26/2023, CONSULTATION DATE:  01/26/2023 REFERRING MD:  Andria Meuse, EDP, CHIEF COMPLAINT:  seizure   History of Present Illness:  72 year old male with past medical history of heart failure with recovered EF, MCA stroke, hypertension, alcohol use disorder, seizure disorder who presented to the ED via EMS with seizure, altered mental status. Patient unable to participate in history, therefore HPI obtained from ED, neurology notes. Per chart review, patient was up this AM making his bed when sister, Kathie Rhodes, heard a fall/loud noise around 0830. She found patient lying on the floor and called 911 with concern of seizure activity. Notes he has been compliant with AEDs, unclear on his alcohol use, and eating "okay." Per ED triage notes, EMS noted seizure activity, right tremor, leftward gaze. He was given 5mg  versed which did not break seizure activity, and given subsequent 2.5mg  with abatement. He was intubated immediately upon arrival. He was called a code stroke and taken to CT with no evidence of bleed. He was loaded with keppra and started on propofol/fentanyl and admitted to [facility].   Pertinent  Medical History  heart failure with recovered EF, MCA stroke, hypertension, alcohol use disorder, seizure disorder  Significant Hospital Events: Including procedures, antibiotic start and stop dates in addition to other pertinent events   12/3: admitted to ICU after seizure, altered mental status. CT head negative.   Interim History / Subjective:  Unable to participate in subjective portion of exam. He is intubated, sedated, spontaneously moving his extremities but non purposeful and not following commands.   Objective   Blood pressure 129/72, pulse (!) 57, resp. rate 20, height 5\' 11"  (1.803 m), weight 82.4 kg, SpO2 100%.    Vent Mode: PRVC FiO2 (%):  [100 %] 100 % Set Rate:  [14 bmp] 14 bmp Vt Set:  [600 mL] 600  mL PEEP:  [5 cmH20] 5 cmH20 Plateau Pressure:  [21 cmH20] 21 cmH20  No intake or output data in the 24 hours ending 01/26/23 1113 Filed Weights   01/26/23 0952 01/26/23 1000  Weight: 82.4 kg 82.4 kg    Examination: General: elderly male, acutely ill appearing, intubated in no acute distress  HENT: [place] AT. Mucous membranes dry. Anicteric sclera, pupils pinpoint, ETT/OGT Lungs: rhonchi right, vented breath sounds, small amount of thick sputum with ETT suction Cardiovascular: s1/s2, bradycardia rate 50s. No murmur, rub, gallop  Abdomen: rounded, soft, decreased BS Extremities: no pitting edema, dry skin  Neuro: intubated, sedated. Pupils pinpoint and sluggish. +gag/cough. Withdraws to pain stimulus GU: foley   Resolved Hospital Problem list     Assessment & Plan:  Seizure; appears to be separate from alcohol use disorder but certainly could be exacerbation by alcohol. Seen by neurology 05/18/22 for new onset seizure from focal encephalomalacia in left parietal cortex. On keppra at home. CT head negative for acute findings. Ethanol negative.  Acute encephalopathy  - f/u UA, UDS, lactic, mag - neurology consulted, appreciate recs  - con't EEG overnight  - loaded with keppra, AED per neurology  - versed prn breakthrough (on propofol)  - frequent neuro checks  - seizure precautions   Acute respiratory failure due to acute encephalopathy and seizures  ?aspiration pneumonia  - f/u procal - start Unasyn for concern asp pna on cxr, f/u imaging tomorrow  - full mechanical vent support - lung protective ventilation 6-8cc/kg Vt - VAP and PAD bundle in place  - H2B -  titrate FiO2 to sat goal >92  - maintain peak/plats <30, driving pressures <60    Heart failure; most recent echo 06/2022 with EF 60-65%, mild LVH, G1DD  - hold carvedilol, lasix for now, restart when clinically appropriate   Hyperlipidemia Hypertension  - BP stable for now, restart home meds when appropriate  - con't  statin   Alcohol use disorder; ethanol negative in ED. Unclear last drink  - on propofol for now   - monitor for signs of withdraw  - thiamine, folic acid, mtvn now - can initiate CIWA/librium or phenobarb taper if needed  - counsel on cessation   Best Practice (right click and "Reselect all SmartList Selections" daily)   Diet/type: NPO DVT prophylaxis: LMWH Pressure ulcer(s). pressure injury stage n/a was NOT present on admission GI prophylaxis: H2B Lines: N/A Foley:  Yes, and it is still needed Code Status:  full code Last date of multidisciplinary goals of care discussion [attempting to touch base with family]  Labs   CBC: Recent Labs  Lab 01/26/23 0954 01/26/23 1000  WBC 7.7  --   NEUTROABS 5.2  --   HGB 11.9* 13.3  HCT 37.8* 39.0  MCV 93.6  --   PLT 144*  --     Basic Metabolic Panel: Recent Labs  Lab 01/26/23 0954 01/26/23 1000  NA 138 140  K 4.2 4.2  CL 106 111  CO2 18*  --   GLUCOSE 85 86  BUN 20 21  CREATININE 1.38* 1.30*  CALCIUM 8.9  --    GFR: Estimated Creatinine Clearance: 56.3 mL/min (A) (by C-G formula based on SCr of 1.3 mg/dL (H)). Recent Labs  Lab 01/26/23 0954  WBC 7.7    Liver Function Tests: Recent Labs  Lab 01/26/23 0954  AST 28  ALT 12  ALKPHOS 63  BILITOT 0.6  PROT 6.4*  ALBUMIN 3.4*   No results for input(s): "LIPASE", "AMYLASE" in the last 168 hours. No results for input(s): "AMMONIA" in the last 168 hours.  ABG    Component Value Date/Time   PHART 7.502 (H) 12/22/2022 0551   PCO2ART 27.5 (L) 12/22/2022 0551   PO2ART 172 (H) 12/22/2022 0551   HCO3 21.6 12/22/2022 0551   TCO2 18 (L) 01/26/2023 1000   ACIDBASEDEF 1.0 12/22/2022 0551   O2SAT 100 12/22/2022 0551     Coagulation Profile: Recent Labs  Lab 01/26/23 0954  INR 1.1    Cardiac Enzymes: No results for input(s): "CKTOTAL", "CKMB", "CKMBINDEX", "TROPONINI" in the last 168 hours.  HbA1C: Hgb A1c MFr Bld  Date/Time Value Ref Range Status   02/21/2022 04:18 AM 5.0 4.8 - 5.6 % Final    Comment:    (NOTE)         Prediabetes: 5.7 - 6.4         Diabetes: >6.4         Glycemic control for adults with diabetes: <7.0   03/08/2019 12:34 PM 5.0 4.8 - 5.6 % Final    Comment:    (NOTE) Pre diabetes:          5.7%-6.4% Diabetes:              >6.4% Glycemic control for   <7.0% adults with diabetes     CBG: Recent Labs  Lab 01/26/23 0955 01/26/23 1005  GLUCAP 60* 139*    Review of Systems:   As above  Past Medical History:  He,  has a past medical history of Alcohol abuse, Heart failure with  improved ejection fraction (HFimpEF) (HCC) (02/2022), Hypertension, Seizure (HCC), and Seizure disorder (HCC).   Surgical History:   Past Surgical History:  Procedure Laterality Date   JOINT REPLACEMENT       Social History:   reports that he has quit smoking. He has never used smokeless tobacco. He reports that he does not currently use alcohol after a past usage of about 84.0 standard drinks of alcohol per week. He reports that he does not currently use drugs after having used the following drugs: Marijuana.   Family History:  His family history includes Heart attack (age of onset: 32) in his mother.   Allergies No Known Allergies   Home Medications  Prior to Admission medications   Medication Sig Start Date End Date Taking? Authorizing Provider  aspirin 81 MG chewable tablet Chew 1 tablet (81 mg total) by mouth daily. 03/04/22 03/04/23  Gillis Santa, MD  aspirin EC 81 MG tablet Take 81 mg by mouth daily. Swallow whole.    [provider]  atorvastatin (LIPITOR) 40 MG tablet Take 40 mg by mouth daily.    [provider]  atorvastatin (LIPITOR) 40 MG tablet Take 40 mg by mouth at bedtime.    [provider]  carvedilol (COREG) 25 MG tablet Take 1 tablet (25 mg total) by mouth 2 (two) times daily with a meal. 03/03/22 03/03/23  Gillis Santa, MD  carvedilol (COREG) 25 MG tablet Take 25 mg by mouth in  the morning and at bedtime.    [provider]  cyanocobalamin (VITAMIN B12) 1000 MCG tablet Take 1,000 mcg by mouth daily. 07/16/22   [provider]  cyanocobalamin (VITAMIN B12) 1000 MCG tablet Take 1,000 mcg by mouth daily. 11/24/22   [provider]  feeding supplement (ENSURE ENLIVE / ENSURE PLUS) LIQD Take 237 mLs by mouth 2 (two) times daily between meals. 12/30/22   Narda Bonds, MD  FEROSUL 325 (65 Fe) MG tablet Take 325 mg by mouth daily with breakfast. 07/16/22   [provider]  folic acid (FOLVITE) 1 MG tablet Take 1 mg by mouth daily. 07/16/22   [provider]  folic acid (FOLVITE) 1 MG tablet Take 1 mg by mouth daily.    [provider]  furosemide (LASIX) 20 MG tablet TAKE 1 TABLET BY MOUTH ONCE A DAY 11/09/22   Creig Hines, NP  furosemide (LASIX) 20 MG tablet Take 20 mg by mouth daily.    [provider]  iron polysaccharides (NIFEREX) 150 MG capsule Take 1 capsule (150 mg total) by mouth daily. 03/04/22 08/05/22  Gillis Santa, MD  levETIRAcetam (KEPPRA) 1000 MG tablet Take 1,000 mg by mouth 2 (two) times daily.    [provider]  magnesium oxide (MAG-OX) 400 (240 Mg) MG tablet Take 1 tablet by mouth daily. 07/16/22   [provider]  magnesium oxide (MAG-OX) 400 (240 Mg) MG tablet Take 400 mg by mouth daily.    [provider]  Multiple Vitamin (MULTIVITAMIN WITH MINERALS) TABS tablet Take 1 tablet by mouth daily. 12/31/22   Narda Bonds, MD  pantoprazole (PROTONIX) 20 MG tablet Take 1 tablet (20 mg total) by mouth daily. 09/19/22 09/19/23  Willy Eddy, MD  tamsulosin (FLOMAX) 0.4 MG CAPS capsule Take 0.4 mg by mouth daily.    [provider]  thiamine (VITAMIN B-1) 100 MG tablet Take 100 mg by mouth daily.    [provider]  thiamine (VITAMIN B1) 100 MG tablet Take 100  mg by mouth daily. 07/16/22   [provider]     Critical care time: 91     Lenard Galloway [facility] 01/26/23 12:44 PM  Please see [email or web address] for pager details.  From 7A-7P if no response, please call [phone] After hours, please call ELink [phone]

## 2023-01-26 NOTE — ED Notes (Signed)
Preparing to transport

## 2023-01-26 NOTE — Progress Notes (Signed)
Hanging 1500 mg Keppra.

## 2023-01-26 NOTE — Progress Notes (Signed)
1000 mg keppra hung to equal total 4500mg  given since arrival.

## 2023-01-26 NOTE — ED Notes (Signed)
PCCM at [facility]. EEG continues.

## 2023-01-26 NOTE — ED Notes (Signed)
Pt transported on the ventilator from Trauma C to 4N23 without complication. RT and RN accompanied pt. Report given to ICU RT.

## 2023-01-26 NOTE — ED Notes (Signed)
EEG initiated. Pt suctioned.

## 2023-01-26 NOTE — Progress Notes (Signed)
eLink Physician-Brief Progress Note Patient Name: John Spears DOB: 1950/07/12 MRN: [id number]   Date of Service  01/26/2023  HPI/Events of Note  72 year old with prior CVA, seizure disorder, LKW 8:30 AM, witnessed seizure activity by his sister after fall intubated on arrival to ED. ongoing sedation for seizures.  Minimal urine output with bladder scan of 450.  eICU Interventions  Facility protocol-initiate In-N-Out catheterizations every 6 hours.     Intervention Category Intermediate Interventions: Oliguria - evaluation and management  Jennie Hannay 01/26/2023, 8:40 PM

## 2023-01-26 NOTE — Progress Notes (Signed)
LTM EEG hooked up and running - no initial skin breakdown - push button tested - No [facility] monitoring. Patient in the ED

## 2023-01-26 NOTE — ED Notes (Signed)
Vent alarming, RT into assess

## 2023-01-26 NOTE — Consult Note (Signed)
NEUROLOGY CONSULT NOTE   Date of service: January 26, 2023 Patient Name: John Spears MRN:  [id number] DOB:  1950/09/08 Chief Complaint: "seizure" Requesting Provider: Arletha Pili, DO  History of Present Illness  Kaniel Botley is a 72 y.o. male with pertinent PMH of HfimpEF, HTN, alcohol use disorder, seizure disorder, who was BIB EMS after a fall at home admitted with concern for seizure.  History obtained from patient's sister, Kathie Rhodes. She states that this morning he got up and was making his bed when she heard something fall and make a loud noise. This was around 8:30 AM. She got out of bed to come see what happened and saw him lying on the floor. She was afraid he was having a seizure and told him that she was going to call 911, to which he replied that he was going to get up on his own. When he attempted to get up, he was not able to and she noted he was having shaking in both of his arms and jerking all over.  Kathie Rhodes helps Mr. Walloch with his medications every day, including his Keppra twice daily. She says that he has been taking his medication and missed this morning's dose due to this event.  She worries about his alcohol intake and that it is causing most of his medical problems. She says that she does believe he had beer yesterday but did not drink the day prior. He has been eating and drinking okay, though he "doesn't eat how he should."  She also reports that he has been using the restroom more, every 20-30 minutes at night, and that he has had a decline in cognition lately, most notably with his memory.  Based on chart review semiology of events is typically right gaze deviation, right sided jerking, with secondary generalization at times; postictal left gaze deviation  LKW: 8:30 AM 01/26/2023 Modified rankin score: 4-Needs assistance to walk and tend to bodily needs (uses walker at baseline) IV Thrombolysis: No -- seizure   NIHSS not obtainable given patient  paralyzed for intubation and presenting with seizure     ROS  Unable to ascertain due to sedation/intubation  Past History   Past Medical History Past Medical History:  Diagnosis Date   Alcohol abuse    Heart failure with improved ejection fraction (HFimpEF) (HCC) 02/2022   a. 02/2019 Echo: EF 60-65%, GrI DD; b. 01/2022 Echo: EF 30-35%; c. 06/2022 Echo: EF 60-65%, mild LVH, RI DD, nl RV fxn, triv MR.   Hypertension    Seizure (HCC)    Seizure disorder [facility])    Past Surgical History Past Surgical History:  Procedure Laterality Date   JOINT REPLACEMENT     Family History: Family History  Problem Relation Age of Onset   Heart attack Mother 14   Social History  reports that he has quit smoking. He has never used smokeless tobacco. He reports that he does not currently use alcohol after a past usage of about 84.0 standard drinks of alcohol per week. He reports that he does not currently use drugs after having used the following drugs: Marijuana.  Allergies No Known Allergies  Medications   Current Facility-Administered Medications:    dextrose 50 % solution 25 g, 25 g, Intravenous, Once, Anders Simmonds T, DO   etomidate (AMIDATE) injection 20 mg, 20 mg, Intravenous, Once, Anders Simmonds T, DO   fentaNYL (SUBLIMAZE) bolus via infusion 25-100 mcg, 25-100 mcg, Intravenous, Q15 min PRN, Arletha Pili, DO  fentaNYL in NS (42mcg/ml) infusion-PREMIX, 25-200 mcg/hr, Intravenous, Continuous, Anders Simmonds T, DO   levETIRAcetam (KEPPRA) IVPB 1000 mg/100 mL premix, 1,000 mg, Intravenous, Once **FOLLOWED BY** levETIRAcetam (KEPPRA) IVPB 1000 mg/100 mL premix, 1,000 mg, Intravenous, Once, Anders Simmonds T, DO   levETIRAcetam (KEPPRA) IVPB 1500 mg/ 100 mL premix, 1,500 mg, Intravenous, Once **FOLLOWED BY** levETIRAcetam (KEPPRA) IVPB 1000 mg/100 mL premix, 1,000 mg, Intravenous, Once, Jquan Egelston L, MD   LORazepam (ATIVAN) 2 MG/ML injection, , , ,    LORazepam  (ATIVAN) injection 2 mg, 2 mg, Intravenous, Once, Anders Simmonds T, DO   propofol (DIPRIVAN) 1000 MG/100ML infusion, 5-80 mcg/kg/min, Intravenous, Titrated, Anders Simmonds T, DO, Last Rate: 4.94 mL/hr at 01/26/23 1006, 10 mcg/kg/min at 01/26/23 1006   rocuronium (ZEMURON) injection 100 mg, 100 mg, Intravenous, Once, Anders Simmonds T, DO  Current Outpatient Medications:    aspirin 81 MG chewable tablet, Chew 1 tablet (81 mg total) by mouth daily., Disp: 30 tablet, Rfl: 11   aspirin EC 81 MG tablet, Take 81 mg by mouth daily. Swallow whole., Disp: , Rfl:    atorvastatin (LIPITOR) 40 MG tablet, Take 40 mg by mouth daily., Disp: , Rfl:    atorvastatin (LIPITOR) 40 MG tablet, Take 40 mg by mouth at bedtime., Disp: , Rfl:    carvedilol (COREG) 25 MG tablet, Take 1 tablet (25 mg total) by mouth 2 (two) times daily with a meal., Disp: 60 tablet, Rfl: 11   carvedilol (COREG) 25 MG tablet, Take 25 mg by mouth in the morning and at bedtime., Disp: , Rfl:    cyanocobalamin (VITAMIN B12) 1000 MCG tablet, Take 1,000 mcg by mouth daily., Disp: , Rfl:    cyanocobalamin (VITAMIN B12) 1000 MCG tablet, Take 1,000 mcg by mouth daily., Disp: , Rfl:    feeding supplement (ENSURE ENLIVE / ENSURE PLUS) LIQD, Take 237 mLs by mouth 2 (two) times daily between meals., Disp: , Rfl:    FEROSUL 325 (65 Fe) MG tablet, Take 325 mg by mouth daily with breakfast., Disp: , Rfl:    folic acid (FOLVITE) 1 MG tablet, Take 1 mg by mouth daily., Disp: , Rfl:    folic acid (FOLVITE) 1 MG tablet, Take 1 mg by mouth daily., Disp: , Rfl:    furosemide (LASIX) 20 MG tablet, TAKE 1 TABLET BY MOUTH ONCE A DAY, Disp: 90 tablet, Rfl: 3   furosemide (LASIX) 20 MG tablet, Take 20 mg by mouth daily., Disp: , Rfl:    iron polysaccharides (NIFEREX) 150 MG capsule, Take 1 capsule (150 mg total) by mouth daily., Disp: 30 capsule, Rfl: 2   levETIRAcetam (KEPPRA) 1000 MG tablet, Take 1,000 mg by mouth 2 (two) times daily., Disp: , Rfl:    magnesium  oxide (MAG-OX) 400 (240 Mg) MG tablet, Take 1 tablet by mouth daily., Disp: , Rfl:    magnesium oxide (MAG-OX) 400 (240 Mg) MG tablet, Take 400 mg by mouth daily., Disp: , Rfl:    Multiple Vitamin (MULTIVITAMIN WITH MINERALS) TABS tablet, Take 1 tablet by mouth daily., Disp: , Rfl:    pantoprazole (PROTONIX) 20 MG tablet, Take 1 tablet (20 mg total) by mouth daily., Disp: 30 tablet, Rfl: 1   tamsulosin (FLOMAX) 0.4 MG CAPS capsule, Take 0.4 mg by mouth daily., Disp: , Rfl:    thiamine (VITAMIN B-1) 100 MG tablet, Take 100 mg by mouth daily., Disp: , Rfl:    thiamine (VITAMIN B1) 100 MG tablet, Take 100 mg by mouth  daily., Disp: , Rfl:   Vitals   Vitals:   01/30/2023 1005 2023/01/30 1010 30-Jan-2023 1015 2023/01/30 1020  BP: 135/67 139/75 (!) 184/80 (!) 168/91  Pulse: 68 71 70 73  Resp: 16 16 14 18   SpO2: 100% 100% 100% 99%  Weight:        Body mass index is 25.34 kg/m.  Physical Exam   Constitutional: Appears chronically ill  Psych: Not interactive Eyes: Scleral edema is absent  HENT: ET tube in place MSK: no major joint deformities.  Cardiovascular: Normal rate and regular rhythm initially, later sinus brady to 50s  Respiratory: Breathing comfortably on the ventilator, breathing over the ventilator set rate GI: Soft.  No distension. There is no tenderness.  Skin: Warm dry and intact visible skin.  There is no significant edema  Neurologic Examination   Physical Exam    Neuro: Mental Status: Does not open eyes spontaneously, to voice or noxious stimulation Does not follow any commands Cranial Nerves: II: No blink to threat. Pupils are equal, round, and reactive to light. 2 mm to 1 mm c/w fentanyl  III,IV, VI/VIII: EOMI sluggish to VOR V/VII: Minimal response to eyelash brush VIII: No response to voice X/XI: No cough/gag XII: Unable to assess tongue protrusion secondary to patient's mental status  Motor/Sensory: Tone is normal. Bulk is normal.  No movement to noxious stim in  any of the extremities  Deep Tendon Reflexes: Absent throughout initially  Cerebellar: Unable to assess secondary to patient's mental status    Labs/Imaging/Neurodiagnostic studies   CBC:  Recent Labs  Lab 01-30-23 1000  HGB 13.3  HCT 39.0   Basic Metabolic Panel:  Lab Results  Component Value Date   NA 140 01/30/23   K 4.2 2023/01/30   CO2 27 12/29/2022   GLUCOSE 86 Jan 30, 2023   BUN 21 01-30-2023   CREATININE 1.30 (H) 30-Jan-2023   CALCIUM 9.3 12/29/2022   GFRNONAA >60 12/29/2022   GFRAA >60 04/04/2019   Lipid Panel:  Lab Results  Component Value Date   LDLCALC 133 (H) 03/08/2019   HgbA1c:  Lab Results  Component Value Date   HGBA1C 5.0 02/21/2022   Urine Drug Screen:     Component Value Date/Time   LABOPIA NONE DETECTED 02/20/2022 1652   COCAINSCRNUR NONE DETECTED 02/20/2022 1652   LABBENZ POSITIVE (A) 02/20/2022 1652   AMPHETMU NONE DETECTED 02/20/2022 1652   THCU POSITIVE (A) 02/20/2022 1652   LABBARB NONE DETECTED 02/20/2022 1652    Alcohol Level     Component Value Date/Time   ETH 11 (H) 12/21/2022 1820   INR  Lab Results  Component Value Date   INR 1.1 12/21/2022   APTT  Lab Results  Component Value Date   APTT 21 (L) 02/20/2022   AED levels: No results found for: "PHENYTOIN", "ZONISAMIDE", "LAMOTRIGINE", "LEVETIRACETA"  CT Head without contrast(Personally reviewed): No hemorrhage or CT evidence of an acute cortical infarct.  Neurodiagnostics Continuous EEG:  Ordered  ASSESSMENT   Ehitan Griego is a 72 y.o. male with pertinent PMH of HfimpEF, HTN, alcohol use disorder, seizure disorder, who was BIB EMS after a fall at home admitted with concern for seizure.  Breakthrough seizure, with history of seizure disorder: Per sister who assists patient with medications daily, he is adherent to home regimen of keppra 1000 mg daily and has not recently missed doses. He does have alcohol use disorder but she explains that he did consume  beer yesterday though she does not believe he consumed  any alcohol the day prior. Ethanol level undetectable so withdrawal seizure certainly a possibility. CBG on arrival was low, 60, also a possible provoking factor as is low magnesium. Initial CT head without evidence of acute infarct or hemorrhage. CTA not pursued given primary concern for seizure as well as AKI present  Additional c/f seizure activity with forced right gaze deviation and generalized shaking on arrival to ICU; versed added (off EEG as this was not yet transported from ED)  RECOMMENDATIONS  -Check keppra level if able to add on -Continuous EEG -Keppra load 4.5 mg loading dose; continue home dose of 1000 mg BID (max for current renal function, also home dose) -Continue propofol 30, wean if needed for bradycardia -Versed started at 0.1 mg/kg/hr (range for status typically 0.2 - 2 mg/kg/hr;  but EEG very flat on my review, will adjust as needed) -2 g IV mag due to low level, STAT; trend -Frequent neuro checks -Q4 glucose checks, goal euglycemia -Admit to PCCM, appreciate their management of comorbidities ______________________________________________________________________   Champ Mungo, DO Internal Medicine PGY-3  Signed, Gordy Councilman, MD [facility]  CRITICAL CARE Performed by: Gordy Councilman   Total critical care time: 80 minutes  Critical care time was exclusive of separately billable procedures and treating other patients.  Critical care was necessary to treat or prevent imminent or life-threatening deterioration.  Critical care was time spent personally by me on the following activities: development of treatment plan with patient and/or surrogate as well as nursing, discussions with consultants, evaluation of patient's response to treatment, examination of patient, obtaining history from patient or surrogate, ordering and performing treatments and interventions, ordering and review of  laboratory studies, ordering and review of radiographic studies, pulse oximetry and re-evaluation of patient's condition.

## 2023-01-26 NOTE — ED Notes (Signed)
 Pending RT for transport

## 2023-01-26 NOTE — ED Notes (Signed)
Back to room, no changes, VSS, breathing with vent.

## 2023-01-26 NOTE — Code Documentation (Signed)
John Spears is a 72 yr old male arriving to [facility] via EMS on 01/26/2023. He has a PMH of prior CVA, seizures, heart failure, thrombocytopenia and ETOH abuse. Pt is coming from home, where he has an estimated LKW time of 0800, after which time he began seizing. Pt received antiepileptics en route and was obtunded upon arrival. He was intubated emergently. Code stroke alert was activated after intubation, for unresponsiveness.    Pt met in Trauma C by stroke team. Pt is unresponsive, NIHSS 38. After CXR obtained, Pt to CT with team. The following imaging was obtained: CT head. Per Dr. Iver Nestle, CT is negative for acute hemorrhage. Pt back to Trauma C where his workup will continue. He will need q 2 hr NIHSS and VS. Bedside handoff with ED RN complete. Pt ineligible for thrombolytics and NIR as low suspicion for stroke, uncertain LKW.

## 2023-01-26 NOTE — ED Notes (Signed)
Pt transported from Trauma A to CT3 and back on the ventilator without complication. RT, RN, RR RN and Stroke RN accompanied pt.

## 2023-01-26 NOTE — ED Notes (Deleted)
Rosey Bath [phone]

## 2023-01-27 ENCOUNTER — Inpatient Hospital Stay (HOSPITAL_COMMUNITY): Payer: 59

## 2023-01-27 DIAGNOSIS — G40901 Epilepsy, unspecified, not intractable, with status epilepticus: Secondary | ICD-10-CM | POA: Diagnosis not present

## 2023-01-27 DIAGNOSIS — G40909 Epilepsy, unspecified, not intractable, without status epilepticus: Secondary | ICD-10-CM

## 2023-01-27 DIAGNOSIS — G9340 Encephalopathy, unspecified: Secondary | ICD-10-CM | POA: Diagnosis not present

## 2023-01-27 DIAGNOSIS — R569 Unspecified convulsions: Secondary | ICD-10-CM | POA: Diagnosis not present

## 2023-01-27 LAB — CBC
HCT: 31.9 % — ABNORMAL LOW (ref 39.0–52.0)
Hemoglobin: 10.4 g/dL — ABNORMAL LOW (ref 13.0–17.0)
MCH: 29.6 pg (ref 26.0–34.0)
MCHC: 32.6 g/dL (ref 30.0–36.0)
MCV: 90.9 fL (ref 80.0–100.0)
Platelets: 107 10*3/uL — ABNORMAL LOW (ref 150–400)
RBC: 3.51 MIL/uL — ABNORMAL LOW (ref 4.22–5.81)
RDW: 14.5 % (ref 11.5–15.5)
WBC: 6.6 10*3/uL (ref 4.0–10.5)
nRBC: 0 % (ref 0.0–0.2)

## 2023-01-27 LAB — URINALYSIS, ROUTINE W REFLEX MICROSCOPIC
Bilirubin Urine: NEGATIVE
Glucose, UA: NEGATIVE mg/dL
Hgb urine dipstick: NEGATIVE
Ketones, ur: NEGATIVE mg/dL
Leukocytes,Ua: NEGATIVE
Nitrite: NEGATIVE
Protein, ur: NEGATIVE mg/dL
Specific Gravity, Urine: 1.032 — ABNORMAL HIGH (ref 1.005–1.030)
pH: 5 (ref 5.0–8.0)

## 2023-01-27 LAB — BASIC METABOLIC PANEL
Anion gap: 10 (ref 5–15)
BUN: 20 mg/dL (ref 8–23)
CO2: 21 mmol/L — ABNORMAL LOW (ref 22–32)
Calcium: 8.4 mg/dL — ABNORMAL LOW (ref 8.9–10.3)
Chloride: 109 mmol/L (ref 98–111)
Creatinine, Ser: 1.35 mg/dL — ABNORMAL HIGH (ref 0.61–1.24)
GFR, Estimated: 56 mL/min — ABNORMAL LOW (ref >60)
Glucose, Bld: 87 mg/dL (ref 70–99)
Potassium: 3.7 mmol/L (ref 3.5–5.1)
Sodium: 140 mmol/L (ref 135–145)

## 2023-01-27 LAB — GLUCOSE, CAPILLARY
Glucose-Capillary: 101 mg/dL — ABNORMAL HIGH (ref 70–99)
Glucose-Capillary: 104 mg/dL — ABNORMAL HIGH (ref 70–99)
Glucose-Capillary: 62 mg/dL — ABNORMAL LOW (ref 70–99)
Glucose-Capillary: 64 mg/dL — ABNORMAL LOW (ref 70–99)
Glucose-Capillary: 74 mg/dL (ref 70–99)
Glucose-Capillary: 85 mg/dL (ref 70–99)
Glucose-Capillary: 90 mg/dL (ref 70–99)
Glucose-Capillary: 94 mg/dL (ref 70–99)

## 2023-01-27 LAB — POCT I-STAT 7, (LYTES, BLD GAS, ICA,H+H)
Acid-base deficit: 4 mmol/L — ABNORMAL HIGH (ref 0.0–2.0)
Bicarbonate: 20.8 mmol/L (ref 20.0–28.0)
Calcium, Ion: 1.27 mmol/L (ref 1.15–1.40)
HCT: 33 % — ABNORMAL LOW (ref 39.0–52.0)
Hemoglobin: 11.2 g/dL — ABNORMAL LOW (ref 13.0–17.0)
O2 Saturation: 98 %
Patient temperature: 99.4
Potassium: 4.1 mmol/L (ref 3.5–5.1)
Sodium: 142 mmol/L (ref 135–145)
TCO2: 22 mmol/L (ref 22–32)
pCO2 arterial: 37.9 mm[Hg] (ref 32–48)
pH, Arterial: 7.35 (ref 7.35–7.45)
pO2, Arterial: 114 mm[Hg] — ABNORMAL HIGH (ref 83–108)

## 2023-01-27 LAB — RAPID URINE DRUG SCREEN, HOSP PERFORMED
Amphetamines: NOT DETECTED
Barbiturates: POSITIVE — AB
Benzodiazepines: POSITIVE — AB
Cocaine: NOT DETECTED
Opiates: NOT DETECTED
Tetrahydrocannabinol: NOT DETECTED

## 2023-01-27 LAB — MAGNESIUM
Magnesium: 2.2 mg/dL (ref 1.7–2.4)
Magnesium: 2.1 mg/dL (ref 1.7–2.4)
Magnesium: 2.2 mg/dL (ref 1.7–2.4)

## 2023-01-27 LAB — AMMONIA: Ammonia: 16 umol/L (ref 9–35)

## 2023-01-27 LAB — PHOSPHORUS
Phosphorus: 2.7 mg/dL (ref 2.5–4.6)
Phosphorus: 2.6 mg/dL (ref 2.5–4.6)

## 2023-01-27 LAB — CK: Total CK: 142 U/L (ref 49–397)

## 2023-01-27 LAB — TRIGLYCERIDES: Triglycerides: 66 mg/dL (ref <150)

## 2023-01-27 MED ORDER — DEXTROSE 50 % IV SOLN
INTRAVENOUS | Status: AC
  Filled 2023-01-27: qty 50

## 2023-01-27 MED ORDER — PROSOURCE TF20 ENFIT COMPATIBL EN LIQD
60.0000 mL | Freq: Two times a day (BID) | ENTERAL | Status: DC
  Administered 2023-01-27 – 2023-02-01 (×10): 60 mL
  Filled 2023-01-27 (×10): qty 60

## 2023-01-27 MED ORDER — PHENOBARBITAL SODIUM 130 MG/ML IJ SOLN
130.0000 mg | Freq: Three times a day (TID) | INTRAMUSCULAR | Status: DC
  Administered 2023-01-28 (×2): 130 mg via INTRAVENOUS
  Filled 2023-01-27 (×2): qty 1

## 2023-01-27 MED ORDER — PIVOT 1.5 CAL PO LIQD: 1000.0000 mL | ORAL | Status: DC

## 2023-01-27 MED ORDER — VITAL AF 1.2 CAL PO LIQD
1000.0000 mL | ORAL | Status: DC
  Administered 2023-01-27 – 2023-01-31 (×5): 1000 mL

## 2023-01-27 MED ORDER — DEXTROSE 50 % IV SOLN
12.5000 g | INTRAVENOUS | Status: AC
  Administered 2023-01-27: 12.5 g via INTRAVENOUS
  Filled 2023-01-27: qty 50

## 2023-01-27 MED ORDER — POTASSIUM CHLORIDE 20 MEQ PO PACK
40.0000 meq | PACK | Freq: Once | ORAL | Status: AC
  Administered 2023-01-27: 40 meq
  Filled 2023-01-27: qty 2

## 2023-01-27 MED ORDER — PHENOBARBITAL SODIUM 65 MG/ML IJ SOLN: 65.0000 mg | Freq: Two times a day (BID) | INTRAMUSCULAR | Status: DC

## 2023-01-27 MED ORDER — DEXTROSE 50 % IV SOLN
50.0000 mL | Freq: Once | INTRAVENOUS | Status: AC
  Administered 2023-01-27: 25 mL via INTRAVENOUS

## 2023-01-27 MED ORDER — PROSOURCE TF20 ENFIT COMPATIBL EN LIQD: 60.0000 mL | Freq: Every day | ENTERAL | Status: DC

## 2023-01-27 MED ORDER — BETHANECHOL CHLORIDE 10 MG PO TABS
10.0000 mg | ORAL_TABLET | Freq: Three times a day (TID) | ORAL | Status: AC
  Administered 2023-01-27 – 2023-01-29 (×9): 10 mg
  Filled 2023-01-27 (×9): qty 1

## 2023-01-27 MED ORDER — SODIUM CHLORIDE 0.9 % IV SOLN
260.0000 mg | Freq: Once | INTRAVENOUS | Status: AC
  Administered 2023-01-27: 260 mg via INTRAVENOUS
  Filled 2023-01-27: qty 2

## 2023-01-27 MED ORDER — MIDAZOLAM BOLUS VIA INFUSION
4.0000 mg | Freq: Once | INTRAVENOUS | Status: AC
  Administered 2023-01-27: 4 mg via INTRAVENOUS

## 2023-01-27 NOTE — Progress Notes (Signed)
NAME:  John Spears, MRN:  [id number], DOB:  30-Dec-1950, LOS: 1 ADMISSION DATE:  01/26/2023, CONSULTATION DATE:  01/26/2023 REFERRING MD:  Andria Meuse, EDP, CHIEF COMPLAINT:  seizure   History of Present Illness:  72 year old male with past medical history of heart failure with recovered EF, MCA stroke, hypertension, alcohol use disorder, seizure disorder who presented to the ED via EMS with seizure, altered mental status. Patient unable to participate in history, therefore HPI obtained from ED, neurology notes. Per chart review, patient was up this AM making his bed when sister, Kathie Rhodes, heard a fall/loud noise around 0830. She found patient lying on the floor and called 911 with concern of seizure activity. Notes he has been compliant with AEDs, unclear on his alcohol use, and eating "okay." Per ED triage notes, EMS noted seizure activity, right tremor, leftward gaze. He was given 5mg  versed which did not break seizure activity, and given subsequent 2.5mg  with abatement. He was intubated immediately upon arrival. He was called a code stroke and taken to CT with no evidence of bleed. He was loaded with keppra and started on propofol/fentanyl and admitted to [facility].   Pertinent  Medical History  heart failure with recovered EF, MCA stroke, hypertension, alcohol use disorder, seizure disorder  Significant Hospital Events: Including procedures, antibiotic start and stop dates in addition to other pertinent events   12/3: admitted to ICU after seizure, altered mental status. CT head negative. Seen by neuro. LTM started.  12/4 heavily sedated no obvious seizure. Weaning prop.   Interim History / Subjective:  Heavily sedated.   Objective   Blood pressure (!) 107/57, pulse (!) 51, temperature (!) 97.3 F (36.3 C), temperature source Axillary, resp. rate 16, height 5\' 11"  (1.803 m), weight 81.1 kg, SpO2 100%.    Vent Mode: PRVC FiO2 (%):  [40 %-100 %] 40 % Set Rate:  [14 bmp] 14 bmp Vt Set:  [550  mL-600 mL] 550 mL PEEP:  [5 cmH20] 5 cmH20 Plateau Pressure:  [15 cmH20-21 cmH20] 15 cmH20   Intake/Output Summary (Last 24 hours) at 01/27/2023 0929 Last data filed at 01/27/2023 0800 Gross per 24 hour  Intake 1744.09 ml  Output --  Net 1744.09 ml   Filed Weights   01/26/23 0952 01/26/23 1000 01/27/23 0500  Weight: 82.4 kg 82.4 kg 81.1 kg    Examination: General sedated on versed and prop. Minimal response w/ exception of cough w/ sxn attempt HENT orally intubated. Pupils pinpoint Pulm coarse minimal vent support PCXR ett good position. Improved bilateral airspace disease  Card rrr Abd soft Ext warm  Neuro heavily sedated. Coughs w/ sxn no w/d to pain  Gu cl yellow  Resolved Hospital Problem list     Assessment & Plan:  Acute metabolic encephalopathy s/p Break thru Seizures w/ remote h/o stroke appears to be separate from alcohol use disorder but certainly could be exacerbation by alcohol. Suspect element of post-ictal state  Plan AEDs per neuro Cont LTM Serial neuro checks  Seizure precautions F/u keppra level  Wean prop to off Wait on neuro for further titration of versed Keep euglycemic  Avoid fevers  Acute respiratory failure due to acute encephalopathy and seizures c/b aspiration PNA  Cxr improved Mental status barrier to weaning Plan Cont full vent support VAP bundle  Day 2/5 unasyn  F/u pending culture  PAD protocol , change to RASS goal 0 to -1 once cleared by neuro  Heart failure; most recent echo 06/2022 with EF 60-65%, mild LVH, G1DD  Plan hold carvedilol, lasix for now, restart when clinically appropriate   AKI. Never had sig bump tCK. Scr hopefully hit plateau  Plan Renal dose meds Avoid hypotension  Strict I&O Am chem  Hypoglycemia Plan Start TF  Hyperlipidemia and h/o Hypertension  Plan Cont statin Antihypertensives on hold as BP boarderline   Alcohol use disorder; ethanol negative in ED. Unclear last drink  - on propofol for now    Plan Cont thiamine, folic acid, mtvn now Watch for evidence of wd, can initiate CIWA/librium or phenobarb taper if needed   counsel on cessation   Best Practice (right click and "Reselect all SmartList Selections" daily)   Diet/type: tubefeeds DVT prophylaxis: LMWH Pressure ulcer(s). pressure injury stage n/a was NOT present on admission GI prophylaxis: H2B Lines: N/A Foley:  Yes, and it is still needed Code Status:   full code Last date of multidisciplinary goals of care discussion [attempting to touch base with family]    Critical care time: 22

## 2023-01-27 NOTE — Progress Notes (Addendum)
NEUROLOGY CONSULT FOLLOW UP NOTE   Date of service: January 27, 2023 Patient Name: John Spears MRN:  [id number] DOB:  10-02-1950  Brief HPI  John Spears is a 72 y.o. male  has a past medical history of Alcohol abuse, Heart failure with improved ejection fraction (HFimpEF) (HCC) (02/2022), Hypertension, Seizure (HCC), and Seizure disorder (HCC). who presented with breakthrough seizures    Interval Hx/subjective  -Not interactive  Vitals   Current vital signs: BP (!) 107/57   Pulse (!) 51   Temp (!) 97.3 F (36.3 C) (Axillary)   Resp 16   Ht 5\' 11"  (1.803 m)   Wt 81.1 kg   SpO2 100%   BMI 24.94 kg/m  Vital signs in last 24 hours: Temp:  [96.4 F (35.8 C)-97.4 F (36.3 C)] 97.3 F (36.3 C) (12/04 0800) Pulse Rate:  [45-85] 51 (12/04 0800) Resp:  [11-24] 16 (12/04 0800) BP: (90-154)/(49-94) 107/57 (12/04 0800) SpO2:  [100 %] 100 % (12/04 0816) FiO2 (%):  [40 %-100 %] 40 % (12/04 1040) Weight:  [81.1 kg] 81.1 kg (12/04 0500)   Body mass index is 24.94 kg/m.  Physical Exam   Constitutional: Appears chronically ill  Psych: Affect appropriate to situation.  Eyes: No scleral injection.  HENT: ETT in place   Head: Normocephalic.  Cardiovascular: Mild sinus bradycardia Respiratory: Effort normal, non-labored breathing on vent GI: Soft.  No distension. There is no tenderness.  Neurologic Examination   PERRL, gaze midline, slight withdrawal to noxious stim on the left but not on the right. Not following commands, not opening eyes to voice or noxious stim.   Medications  Current Facility-Administered Medications:    Ampicillin-Sulbactam (UNASYN) 3 g in sodium chloride 0.9 % 100 mL IVPB, 3 g, Intravenous, Q6H, Simonne Martinet, NP, Stopped at 01/27/23 0631   atorvastatin (LIPITOR) tablet 40 mg, 40 mg, Per Tube, Daily, Calton Dach I, RPH, 40 mg at 01/27/23 0935   bethanechol (URECHOLINE) tablet 10 mg, 10 mg, Per Tube, TID, Cheri Fowler, MD    Chlorhexidine Gluconate Cloth 2 % PADS 6 each, 6 each, Topical, Daily, Oretha Milch, MD, 6 each at 01/27/23 0245   dextrose 50 % solution, , , ,    docusate (COLACE) 50 MG/5ML liquid 100 mg, 100 mg, Per Tube, BID PRN, Oretha Milch, MD   enoxaparin (LOVENOX) injection 40 mg, 40 mg, Subcutaneous, Q24H, Autry, Lauren E, PA-C, 40 mg at 01/26/23 1338   famotidine (PEPCID) tablet 20 mg, 20 mg, Per Tube, BID, Cherlynn Polo, Lauren E, PA-C, 20 mg at 01/27/23 0935   feeding supplement (PROSource TF20) liquid 60 mL, 60 mL, Per Tube, BID, Cheri Fowler, MD   feeding supplement (VITAL AF 1.2 CAL) liquid 1,000 mL, 1,000 mL, Per Tube, Continuous, Chand, Sudham, MD   fentaNYL (SUBLIMAZE) bolus via infusion 25-100 mcg, 25-100 mcg, Intravenous, Q15 min PRN, Cherlynn Polo, Lauren E, PA-C   fentaNYL (SUBLIMAZE) injection 25 mcg, 25 mcg, Intravenous, Once, Autry, Lauren E, PA-C   fentaNYL in NS (90mcg/ml) infusion-PREMIX, 25-200 mcg/hr, Intravenous, Continuous, Autry, Lauren E, PA-C, Stopped at 01/27/23 0502   folic acid (FOLVITE) tablet 1 mg, 1 mg, Per Tube, Daily, Calton Dach I, RPH, 1 mg at 01/27/23 0935   insulin aspart (novoLOG) injection 0-9 Units, 0-9 Units, Subcutaneous, Q4H, Autry, Lauren E, PA-C   levETIRAcetam (KEPPRA) IVPB 1000 mg/100 mL premix, 1,000 mg, Intravenous, Q12H, Barnaby Rippeon L, MD, Stopped at 01/27/23 0758   midazolam (VERSED) 100 mg/100 mL (  1 mg/mL) premix infusion, 0-8 mg/hr, Intravenous, Continuous, Cheri Fowler, MD, Last Rate: 8 mL/hr at 01/27/23 0800, 8 mg/hr at 01/27/23 0800   midazolam (VERSED) bolus via infusion 16 mg, 16 mg, Intravenous, Q5 min PRN, Cayleb Jarnigan L, MD   multivitamin with minerals tablet 1 tablet, 1 tablet, Per Tube, Daily, Calton Dach I, RPH, 1 tablet at 01/27/23 0935   Oral care mouth rinse, 15 mL, Mouth Rinse, Q2H, Oretha Milch, MD, 15 mL at 01/27/23 0935   Oral care mouth rinse, 15 mL, Mouth Rinse, PRN, Oretha Milch, MD   polyethylene  glycol (MIRALAX / GLYCOLAX) packet 17 g, 17 g, Per Tube, Daily PRN, Cherlynn Polo, Lauren E, PA-C   propofol (DIPRIVAN) 1000 MG/100ML infusion, 0-50 mcg/kg/min, Intravenous, Continuous, Autry, Lauren E, PA-C, Last Rate: 9.89 mL/hr at 01/27/23 0800, 20 mcg/kg/min at 01/27/23 0800   thiamine (VITAMIN B1) 500 mg in sodium chloride 0.9 % 50 mL IVPB, 500 mg, Intravenous, Q8H, Stopped at 01/27/23 0703 **FOLLOWED BY** [START ON 01/29/2023] thiamine (VITAMIN B1) 250 mg in sodium chloride 0.9 % 50 mL IVPB, 250 mg, Intravenous, Daily **FOLLOWED BY** [START ON 02/04/2023] thiamine (VITAMIN B1) injection 100 mg, 100 mg, Intravenous, Daily, Rosevelt Luu L, MD [facility]    Basic Metabolic Panel: Recent Labs  Lab 01/26/23 0954 01/26/23 1000 01/26/23 1313 01/27/23 0522 01/27/23 1017  NA 138 140 140 140  --   K 4.2 4.2 4.0 3.7  --   CL 106 111  --  109  --   CO2 18*  --   --  21*  --   GLUCOSE 85 86  --  87  --   BUN 20 21  --  20  --   CREATININE 1.38* 1.30*  --  1.35*  --   CALCIUM 8.9  --   --  8.4*  --   MG 1.6*  --   --  2.2 2.1  PHOS  --   --   --   --  2.7    CBC: Recent Labs  Lab 01/26/23 0954 01/26/23 1000 01/26/23 1313 01/27/23 0522  WBC 7.7  --   --  6.6  NEUTROABS 5.2  --   --   --   HGB 11.9* 13.3 11.6* 10.4*  HCT 37.8* 39.0 34.0* 31.9*  MCV 93.6  --   --  90.9  PLT 144*  --   --  107*    Coagulation Studies: Recent Labs    01/26/23 0954  LABPROT 14.0  INR 1.1      Lipid Panel:  Lab Results  Component Value Date   LDLCALC 133 (H) 03/08/2019   HgbA1c:  Lab Results  Component Value Date   HGBA1C 5.3 01/26/2023   Urine Drug Screen:     Component Value Date/Time   LABOPIA NONE DETECTED 02/20/2022 1652   COCAINSCRNUR NONE DETECTED 02/20/2022 1652   LABBENZ POSITIVE (A) 02/20/2022 1652   AMPHETMU NONE DETECTED 02/20/2022 1652   THCU POSITIVE (A) 02/20/2022 1652   LABBARB NONE DETECTED 02/20/2022 1652    Alcohol Level     Component Value  Date/Time   ETH <10 01/26/2023 0954   INR  Lab Results  Component Value Date   INR 1.1 01/26/2023   APTT  Lab Results  Component Value Date   APTT 24 01/26/2023    CT Head without contrast(Personally reviewed): No hemorrhage or CT evidence of an acute cortical infarct.  EEG 01/26/2023 1126 to 01/27/2023 1000  IMPRESSION: This study is suggestive of cortical dysfunction in left hemisphere likely secondary underlying structural abnormality.  Additionally there is severe diffuse encephalopathy likely related to sedation.  No seizures or epileptiform discharges were seen during this time.  Assessment   Status epilepticus has resolved, exam slightly improved and c/w post-ictal Todd's at this time.  Recommendations  -Wean versed by 2 mg/hr to stop  -Continue EEG -Keppra load 4.5 mg loading dose; continue home dose of 1000 mg BID (max for current renal function, also home dose) -Appreciate CCM management of comorbidities  -Neurology will follow along  Addednum: No longer withdrawing after weaning versed. EEG with ictal/interictal continuum activity, favor ictal due to worsening exam. Will initiate phenobarb taper and continue to monitor  ______________________________________________________________________   Brooke Dare MD-PhD [facility] [phone]  CRITICAL CARE Performed by: Gordy Councilman   Total critical care time: 30 minutes  Critical care time was exclusive of separately billable procedures and treating other patients.  Critical care was necessary to treat or prevent imminent or life-threatening deterioration.  Critical care was time spent personally by me on the following activities: development of treatment plan with patient and/or surrogate as well as nursing, discussions with consultants, evaluation of patient's response to treatment, examination of patient, obtaining history from patient or surrogate, ordering and performing treatments and  interventions, ordering and review of laboratory studies, ordering and review of radiographic studies, pulse oximetry and re-evaluation of patient's condition.

## 2023-01-27 NOTE — Progress Notes (Signed)
LTM maint complete - no skin breakdown seen Serviced F4 C4 P4 F8 [facility] monitored, Event button test confirmed by [facility].

## 2023-01-27 NOTE — Progress Notes (Signed)
No further seizures thus far.  Weaning versed now but still minimal response even down to 4mg /hr Plan  Cont to wean versed as outlined by neuro Cont LTM to ensure no break thru seizure as we wean benzo Mental status will determine readiness to wean.

## 2023-01-27 NOTE — Procedures (Addendum)
Patient Name: John Spears  MRN: [id number]  Epilepsy Attending: Charlsie Quest  Referring Physician/Provider: Gordy Councilman, MD  Duration: 01/26/2023 1126 to 01/27/2023 1126  Patient history: 72 year old with prior CVA, seizure disorder, LKW 8:30 AM, witnessed seizure activity getting eeg to evaluate for seizure  Level of alertness:  comatose  AEDs during EEG study: Propofol, Versed, LEV  Technical aspects: This EEG study was done with scalp electrodes positioned according to the 10-20 International system of electrode placement. Electrical activity was reviewed with band pass filter of 1-70Hz , sensitivity of 7 uV/mm, display speed of 32mm/sec with a 60Hz  notched filter applied as appropriate. EEG data were recorded continuously and digitally stored.  Video monitoring was available and reviewed as appropriate.  Description: EEG showed continuous generalized and lateralized left hemisphere 3 to 6 Hz theta-delta slowing admixed with 12 to 15 Hz beta activity.  Of note, at the beginning of the study, 2- 4 seconds of generalized EEG attenuation was also noted. Hyperventilation and photic stimulation were not performed.     ABNORMALITY -Continuous slow, generalized and lateralized left hemisphere  IMPRESSION: This study is suggestive of cortical dysfunction in left hemisphere likely secondary underlying structural abnormality.  Additionally there is severe diffuse encephalopathy likely related to sedation.  No seizures or epileptiform discharges were seen during this time.  [name]

## 2023-01-27 NOTE — Progress Notes (Signed)
Called for bedside eval  He was off versed gtt.  Still getting really limited response on my exam initially. Compared w/ am exam  Not purposeful I could not get any response from extremities to pain (this was c/w my exam this am but apparently neurology did).  He did resist pupillary exam and when able I noted both equal and reactive.  He again demonstrated cough w/ suctioning and then developed rapid facial twitching. Initially involving the left eye, then both eyes and jaw. Event LTM bell was triggered. Discussed w/ neurology remotely who felt in context of possible clinical change EEg changes could reflect ictal event.  Gave 1mg  IV versed Activity persisted.  Repeated 1mg  versed and started infusion back at 2mg /hr While at bedside his sister called in for update but confirmed he does in fact still actively drink.  At time of dictation his ictal activity seems to have resolved clinically (after the second versed dose and starting gtt). Entire event lasting about 5 min  Plan Cont versed at 2mg  F/u pending abg and ammonia.  His LFTs were ok but apparently drinks daily and heavily so will add phenobarb taper which would also help if this is wd seizure   My time 45 minutes critical care at bedside   Simonne Martinet ACNP-BC [facility] Pulmonary/Critical Care Pager # [phone] OR # [phone] if no answer

## 2023-01-27 NOTE — ED Provider Notes (Signed)
[facility] NEURO/TRAUMA/SURGICAL ICU Provider Note  CSN: [id number] Arrival date & time: 01/26/23 6213  Chief Complaint(s) Seizures  HPI John Spears is a 72 y.o. male this is a 72-year-old male presenting the emergency department today due to altered mental status.  Patient reportedly had witnessed seizure-like activity.  When EMS arrived, they witnessed patient having a seizure, gave 7.5 of Versed.   Past Medical History Past Medical History:  Diagnosis Date   Alcohol abuse    Heart failure with improved ejection fraction (HFimpEF) (HCC) 02/2022   a. 02/2019 Echo: EF 60-65%, GrI DD; b. 01/2022 Echo: EF 30-35%; c. 06/2022 Echo: EF 60-65%, mild LVH, RI DD, nl RV fxn, triv MR.   Hypertension    Seizure (HCC)    Seizure disorder [facility])    Patient Active Problem List   Diagnosis Date Noted   Altered mental status 01/26/2023   Malnutrition of moderate degree 12/24/2022   Encephalopathy acute 12/22/2022   Acute respiratory failure with hypoxia (HCC) 12/22/2022   ETOH abuse 12/22/2022   Hypophosphatemia 12/22/2022   Thrombocytopenia (HCC) 12/22/2022   Breakthrough seizure (HCC) 12/22/2022   Seizures (HCC) 12/21/2022   Chest pain 09/25/2022   CKD (chronic kidney disease), stage III (HCC) 09/25/2022   HFrEF (heart failure with reduced ejection fraction) (HCC) 04/04/2022   Chest congestion 04/04/2022   Chronic cough 04/04/2022   Dilated cardiomyopathy (HCC) 02/26/2022   Acute on chronic combined systolic and diastolic CHF (congestive heart failure) (HCC) 02/26/2022   Alcohol abuse 02/26/2022   Encephalopathy 02/26/2022   Seizures (HCC) 02/20/2022   Hypomagnesemia    Hypokalemia    Transaminitis    Traumatic rhabdomyolysis (HCC)    Thrombocytopenia (HCC)    Weakness    Lower extremity weakness 03/07/2019   Cerebrovascular accident (CVA) (HCC) 04/05/2016   Home Medication(s) Prior to Admission medications   Medication Sig Start Date End Date Taking? Authorizing Provider   amLODipine (NORVASC) 10 MG tablet Take 10 mg by mouth at bedtime. 01/13/23  Yes [provider]  aspirin 81 MG chewable tablet Chew 1 tablet (81 mg total) by mouth daily. Patient taking differently: Chew 81 mg by mouth at bedtime. 03/04/22 03/04/23 Yes Gillis Santa, MD  atorvastatin (LIPITOR) 40 MG tablet Take 40 mg by mouth at bedtime.   Yes [provider]  carvedilol (COREG) 25 MG tablet Take 25 mg by mouth in the morning and at bedtime.   Yes [provider]  cyanocobalamin (VITAMIN B12) 1000 MCG tablet Take 1,000 mcg by mouth in the morning. 11/24/22  Yes [provider]  feeding supplement (ENSURE ENLIVE / ENSURE PLUS) LIQD Take 237 mLs by mouth 2 (two) times daily between meals. Patient taking differently: Take 237 mLs by mouth 2 (two) times daily as needed. 12/30/22  Yes Narda Bonds, MD  folic acid (FOLVITE) 1 MG tablet Take 1 mg by mouth in the morning. 07/16/22  Yes [provider]  furosemide (LASIX) 20 MG tablet TAKE 1 TABLET BY MOUTH ONCE A DAY Patient taking differently: Take 20 mg by mouth in the morning. 11/09/22  Yes Creig Hines, NP  levETIRAcetam (KEPPRA) 1000 MG tablet Take 1,000 mg by mouth 2 (two) times daily.   Yes [provider]  magnesium oxide (MAG-OX) 400 (240 Mg) MG tablet Take 1 tablet by mouth in the morning. 07/16/22  Yes [provider]  Multiple Vitamin (MULTIVITAMIN WITH MINERALS) TABS tablet Take 1 tablet by mouth daily. Patient taking differently: Take 1 tablet  by mouth at bedtime. 12/31/22  Yes Narda Bonds, MD  tamsulosin (FLOMAX) 0.4 MG CAPS capsule Take 0.4 mg by mouth at bedtime.   Yes [provider]  thiamine (VITAMIN B1) 100 MG tablet Take 100 mg by mouth in the morning. 07/16/22  Yes [provider]                                                                                                                                    Past Surgical History Past  Surgical History:  Procedure Laterality Date   JOINT REPLACEMENT     Family History Family History  Problem Relation Age of Onset   Heart attack Mother 19    Social History Social History   Tobacco Use   Smoking status: Former   Smokeless tobacco: Never  Advertising account planner   Vaping status: Never Used  Substance Use Topics   Alcohol use: Not Currently    Alcohol/week: 84.0 standard drinks of alcohol    Types: 84 Cans of beer per week    Comment: reported by sister 6-12 beers a day and liquor when he can get it   Drug use: Not Currently    Types: Marijuana   Allergies Patient has no known allergies.  Review of Systems Review of Systems  Physical Exam Vital Signs  I have reviewed the triage vital signs BP (!) 101/51   Pulse (!) 45   Temp (!) 97.3 F (36.3 C) (Axillary)   Resp 14   Ht 5\' 11"  (1.803 m)   Wt 81.1 kg   SpO2 100%   BMI 24.94 kg/m   Physical Exam Vitals reviewed.  Constitutional:      Comments: Nonresponsive  HENT:     Head: Atraumatic.  Eyes:     Pupils: Pupils are equal, round, and reactive to light.  Cardiovascular:     Rate and Rhythm: Normal rate.     Pulses: Normal pulses.  Musculoskeletal:        General: No deformity.  Neurological:     Comments: Patient not responding to painful stimuli.     ED Results and Treatments Labs (all labs ordered are listed, but only abnormal results are displayed) Labs Reviewed  CBC - Abnormal; Notable for the following components:      Result Value   RBC 4.04 (*)    Hemoglobin 11.9 (*)    HCT 37.8 (*)    Platelets 144 (*)    All other components within normal limits  COMPREHENSIVE METABOLIC PANEL - Abnormal; Notable for the following components:   CO2 18 (*)    Creatinine, Ser 1.38 (*)    Total Protein 6.4 (*)    Albumin 3.4 (*)    GFR, Estimated 55 (*)    All other components within normal limits  MAGNESIUM - Abnormal; Notable for the following components:   Magnesium 1.6 (*)    All other  components  within normal limits  CK - Abnormal; Notable for the following components:   Total CK 45 (*)    All other components within normal limits  GLUCOSE, CAPILLARY - Abnormal; Notable for the following components:   Glucose-Capillary 117 (*)    All other components within normal limits  CBC - Abnormal; Notable for the following components:   RBC 3.51 (*)    Hemoglobin 10.4 (*)    HCT 31.9 (*)    Platelets 107 (*)    All other components within normal limits  BASIC METABOLIC PANEL - Abnormal; Notable for the following components:   CO2 21 (*)    Creatinine, Ser 1.35 (*)    Calcium 8.4 (*)    GFR, Estimated 56 (*)    All other components within normal limits  GLUCOSE, CAPILLARY - Abnormal; Notable for the following components:   Glucose-Capillary 64 (*)    All other components within normal limits  I-STAT CHEM 8, ED - Abnormal; Notable for the following components:   Creatinine, Ser 1.30 (*)    Calcium, Ion 1.02 (*)    TCO2 18 (*)    All other components within normal limits  CBG MONITORING, ED - Abnormal; Notable for the following components:   Glucose-Capillary 60 (*)    All other components within normal limits  CBG MONITORING, ED - Abnormal; Notable for the following components:   Glucose-Capillary 139 (*)    All other components within normal limits  POCT I-STAT 7, (LYTES, BLD GAS, ICA,H+H) - Abnormal; Notable for the following components:   pH, Arterial 7.348 (*)    pO2, Arterial 433 (*)    Acid-base deficit 5.0 (*)    HCT 34.0 (*)    Hemoglobin 11.6 (*)    All other components within normal limits  MRSA NEXT GEN BY PCR, NASAL  CULTURE, RESPIRATORY W GRAM STAIN  ETHANOL  PROTIME-INR  APTT  DIFFERENTIAL  HEMOGLOBIN A1C  LACTIC ACID, PLASMA  LACTIC ACID, PLASMA  GLUCOSE, CAPILLARY  TRIGLYCERIDES  MAGNESIUM  GLUCOSE, CAPILLARY  GLUCOSE, CAPILLARY  GLUCOSE, CAPILLARY  RAPID URINE DRUG SCREEN, HOSP PERFORMED  URINALYSIS, ROUTINE W REFLEX MICROSCOPIC  BLOOD  GAS, ARTERIAL  CK                                                                                                                          Radiology DG Chest Portable 1 View  Result Date: 01/26/2023 CLINICAL DATA:  Status post intubation. EXAM: PORTABLE CHEST 1 VIEW COMPARISON:  12/22/2022 and older exams. FINDINGS: Endotracheal tube tip projects 4.5 cm above the carina. Nasal/orogastric tube passes below the diaphragm into the mid stomach. There is opacity at the left lung base consistent with atelectasis, pneumonia or a combination. Remainder of the lungs is clear. Cardiac silhouette is mildly enlarged. No mediastinal or hilar masses. No convincing pleural effusion and no pneumothorax. IMPRESSION: 1. Well-positioned endotracheal tube and nasal/orogastric tube. 2. Left lung base opacity that may reflect atelectasis, pneumonia or  a combination. No pulmonary edema Electronically Signed   By: Amie Portland M.D.   On: 01/26/2023 10:47   CT HEAD CODE STROKE WO CONTRAST  Result Date: 01/26/2023 CLINICAL DATA:  Code stroke.  Neuro deficit, acute, stroke suspected EXAM: CT HEAD WITHOUT CONTRAST TECHNIQUE: Contiguous axial images were obtained from the base of the skull through the vertex without intravenous contrast. RADIATION DOSE REDUCTION: This exam was performed according to the departmental dose-optimization program which includes automated exposure control, adjustment of the mA and/or kV according to patient size and/or use of iterative reconstruction technique. COMPARISON:  CT Head 12/26/22 FINDINGS: Brain: No hemorrhage. No hydrocephalus. No extra-axial fluid collection. There is an evolving left occipital temporal region infarct. No mass effect. No mass lesion. No CT evidence of an acute cortical infarct. Vascular: No hyperdense vessel or unexpected calcification. Skull: Normal. Negative for fracture or focal lesion. Sinuses/Orbits: No middle ear or mastoid effusion. Paranasal sinuses are clear. Orbits  are unremarkable. Other: Partially imaged endotracheal and enteric tubes in place. Scout images obtained at the time of imaging are notable for possible new consolidative opacity at the left lung base. This is better assessed on same day chest radiograph ASPECTS [facility] Stroke Program Early CT Score): 10 IMPRESSION: No hemorrhage or CT evidence of an acute cortical infarct Findings were paged to Dr. Iver Nestle on 01/26/23 at 10:29 AM. Electronically Signed   By: Lorenza Cambridge M.D.   On: 01/26/2023 10:31    Pertinent labs & imaging results that were available during my care of the patient were reviewed by me and considered in my medical decision making (see MDM for details).  Medications Ordered in ED Medications  polyethylene glycol (MIRALAX / GLYCOLAX) packet 17 g (has no administration in time range)  enoxaparin (LOVENOX) injection 40 mg (40 mg Subcutaneous Given 01/26/23 1338)  famotidine (PEPCID) tablet 20 mg (20 mg Per Tube Given 01/26/23 2135)  Ampicillin-Sulbactam (UNASYN) 3 g in sodium chloride 0.9 % 100 mL IVPB (3 g Intravenous New Bag/Given 01/27/23 0601)  propofol (DIPRIVAN) 1000 MG/100ML infusion (20 mcg/kg/min  82.4 kg Intravenous New Bag/Given 01/27/23 0605)  fentaNYL (SUBLIMAZE) injection 25 mcg (25 mcg Intravenous Not Given 01/26/23 1919)  fentaNYL in NS (40mcg/ml) infusion-PREMIX (0 mcg/hr Intravenous Stopped 01/27/23 0502)  fentaNYL (SUBLIMAZE) bolus via infusion 25-100 mcg (has no administration in time range)  insulin aspart (novoLOG) injection 0-9 Units ( Subcutaneous Not Given 01/27/23 0322)  Chlorhexidine Gluconate Cloth 2 % PADS 6 each (6 each Topical Given 01/27/23 0245)  docusate (COLACE) 50 MG/5ML liquid 100 mg (has no administration in time range)  midazolam (VERSED) bolus via infusion 16 mg (has no administration in time range)  midazolam (VERSED) 100 mg/100 mL (1 mg/mL) premix infusion (8 mg/hr Intravenous Infusion Verify 01/27/23 0600)  folic acid (FOLVITE) tablet  1 mg (has no administration in time range)  atorvastatin (LIPITOR) tablet 40 mg (40 mg Per Tube Given 01/26/23 1338)  multivitamin with minerals tablet 1 tablet (has no administration in time range)  thiamine (VITAMIN B1) 500 mg in sodium chloride 0.9 % 50 mL IVPB (500 mg Intravenous New Bag/Given 01/27/23 1610)    Followed by  thiamine (VITAMIN B1) 250 mg in sodium chloride 0.9 % 50 mL IVPB (has no administration in time range)    Followed by  thiamine (VITAMIN B1) injection 100 mg (has no administration in time range)  levETIRAcetam (KEPPRA) IVPB 1000 mg/100 mL premix (0 mg Intravenous Paused 01/26/23 2024)  Oral care mouth rinse (  15 mLs Mouth Rinse Given 01/27/23 0559)  Oral care mouth rinse (has no administration in time range)  potassium chloride (KLOR-CON) packet 40 mEq (has no administration in time range)  etomidate (AMIDATE) injection 20 mg (20 mg Intravenous Given 01/26/23 0953)  rocuronium (ZEMURON) injection 100 mg (100 mg Intravenous Given 01/26/23 0954)  LORazepam (ATIVAN) injection 2 mg (2 mg Intravenous Given 01/26/23 0952)  dextrose 50 % solution 25 g (25 g Intravenous Given 01/26/23 0956)  levETIRAcetam (KEPPRA) IVPB 1000 mg/100 mL premix (0 mg Intravenous Stopped 01/26/23 1024)    Followed by  levETIRAcetam (KEPPRA) IVPB 1000 mg/100 mL premix (0 mg Intravenous Stopped 01/26/23 1038)  levETIRAcetam (KEPPRA) IVPB 1500 mg/ 100 mL premix (0 mg Intravenous Stopped 01/26/23 1013)    Followed by  levETIRAcetam (KEPPRA) IVPB 1000 mg/100 mL premix (0 mg Intravenous Stopped 01/26/23 1057)  magnesium sulfate IVPB 2 g 50 mL (0 g Intravenous Stopped 01/26/23 1502)  dextrose 50 % solution 12.5 g (12.5 g Intravenous Given 01/27/23 0335)                                                                                                                                     Procedures .Critical Care  Performed by: Arletha Pili, DO Authorized by: Arletha Pili, DO   Critical care provider  statement:    Critical care time (minutes):  30   Critical care was necessary to treat or prevent imminent or life-threatening deterioration of the following conditions:  CNS failure or compromise   Critical care was time spent personally by me on the following activities:  Development of treatment plan with patient or surrogate, discussions with consultants, evaluation of patient's response to treatment, examination of patient, ordering and review of laboratory studies, ordering and review of radiographic studies, ordering and performing treatments and interventions, pulse oximetry, re-evaluation of patient's condition and review of old charts   Care discussed with: admitting provider   Procedure Name: Intubation Date/Time: 01/26/2023 9:59 AM  Performed by: Arletha Pili, DOPre-anesthesia Checklist: Patient identified Oxygen Delivery Method: Nasal cannula Preoxygenation: Pre-oxygenation with 100% oxygen Induction Type: Rapid sequence Ventilation: Mask ventilation without difficulty Laryngoscope Size: Glidescope and 4 Grade View: Grade I Tube size: 8.0 mm Number of attempts: 1 Airway Equipment and Method: Video-laryngoscopy Placement Confirmation: ETT inserted through vocal cords under direct vision Secured at: 26 cm Tube secured with: ETT holder      (including critical care time)  Medical Decision Making / ED Course   This patient presents to the ED for concern of lethargy, seizure activity, this involves an extensive number of treatment options, and is a complaint that carries with it a high risk of complications and morbidity.  The differential diagnosis includes seizure disorder, alcohol withdrawal, CVA.  MDM: Report from EMS is based on history of head bleed, seizure disorder.  Upon arrival to the emergency department, patient was not protecting his  airway.  Had frequent gurgling.  Intubated the patient for airway protection.  Activate the patient as a code stroke and  reports prior head bleed, and this episode.  Fortunately, head CT did not show any intracranial hemorrhage.  Patient also with a history of alcohol use disorder.  Did receive benzodiazepines.  Loaded patient with Keppra.  Admitted to ICU.   Additional history obtained: -Additional history obtained from EMS -External records from outside source obtained and reviewed including: Chart review including previous notes, labs, imaging, consultation notes   Lab Tests: -I ordered, reviewed, and interpreted labs.   The pertinent results include:   Labs Reviewed  CBC - Abnormal; Notable for the following components:      Result Value   RBC 4.04 (*)    Hemoglobin 11.9 (*)    HCT 37.8 (*)    Platelets 144 (*)    All other components within normal limits  COMPREHENSIVE METABOLIC PANEL - Abnormal; Notable for the following components:   CO2 18 (*)    Creatinine, Ser 1.38 (*)    Total Protein 6.4 (*)    Albumin 3.4 (*)    GFR, Estimated 55 (*)    All other components within normal limits  MAGNESIUM - Abnormal; Notable for the following components:   Magnesium 1.6 (*)    All other components within normal limits  CK - Abnormal; Notable for the following components:   Total CK 45 (*)    All other components within normal limits  GLUCOSE, CAPILLARY - Abnormal; Notable for the following components:   Glucose-Capillary 117 (*)    All other components within normal limits  CBC - Abnormal; Notable for the following components:   RBC 3.51 (*)    Hemoglobin 10.4 (*)    HCT 31.9 (*)    Platelets 107 (*)    All other components within normal limits  BASIC METABOLIC PANEL - Abnormal; Notable for the following components:   CO2 21 (*)    Creatinine, Ser 1.35 (*)    Calcium 8.4 (*)    GFR, Estimated 56 (*)    All other components within normal limits  GLUCOSE, CAPILLARY - Abnormal; Notable for the following components:   Glucose-Capillary 64 (*)    All other components within normal limits   I-STAT CHEM 8, ED - Abnormal; Notable for the following components:   Creatinine, Ser 1.30 (*)    Calcium, Ion 1.02 (*)    TCO2 18 (*)    All other components within normal limits  CBG MONITORING, ED - Abnormal; Notable for the following components:   Glucose-Capillary 60 (*)    All other components within normal limits  CBG MONITORING, ED - Abnormal; Notable for the following components:   Glucose-Capillary 139 (*)    All other components within normal limits  POCT I-STAT 7, (LYTES, BLD GAS, ICA,H+H) - Abnormal; Notable for the following components:   pH, Arterial 7.348 (*)    pO2, Arterial 433 (*)    Acid-base deficit 5.0 (*)    HCT 34.0 (*)    Hemoglobin 11.6 (*)    All other components within normal limits  MRSA NEXT GEN BY PCR, NASAL  CULTURE, RESPIRATORY W GRAM STAIN  ETHANOL  PROTIME-INR  APTT  DIFFERENTIAL  HEMOGLOBIN A1C  LACTIC ACID, PLASMA  LACTIC ACID, PLASMA  GLUCOSE, CAPILLARY  TRIGLYCERIDES  MAGNESIUM  GLUCOSE, CAPILLARY  GLUCOSE, CAPILLARY  GLUCOSE, CAPILLARY  RAPID URINE DRUG SCREEN, HOSP PERFORMED  URINALYSIS, ROUTINE W REFLEX MICROSCOPIC  BLOOD GAS, ARTERIAL  CK      EKG normal sinus rhythm  EKG Interpretation Date/Time:    Ventricular Rate:    PR Interval:    QRS Duration:    QT Interval:    QTC Calculation:   R Axis:      Text Interpretation:           Imaging Studies ordered: I ordered imaging studies including head CT I independently visualized and interpreted imaging. I agree with the radiologist interpretation   Medicines ordered and prescription drug management: Meds ordered this encounter  Medications   LORazepam (ATIVAN) 2 MG/ML injection    Daylene Posey: cabinet override   etomidate (AMIDATE) injection 20 mg   rocuronium (ZEMURON) injection 100 mg   LORazepam (ATIVAN) injection 2 mg   dextrose 50 % solution 25 g   FOLLOWED BY Linked Order Group    levETIRAcetam (KEPPRA) IVPB 1000 mg/100 mL premix     levETIRAcetam (KEPPRA) IVPB 1000 mg/100 mL premix   DISCONTD: fentaNYL in NS (55mcg/ml) infusion-PREMIX   DISCONTD: fentaNYL (SUBLIMAZE) bolus via infusion 25-100 mcg   DISCONTD: propofol (DIPRIVAN) 1000 MG/100ML infusion   FOLLOWED BY Linked Order Group    levETIRAcetam (KEPPRA) IVPB 1500 mg/ 100 mL premix    levETIRAcetam (KEPPRA) IVPB 1000 mg/100 mL premix   DISCONTD: docusate sodium (COLACE) capsule 100 mg   polyethylene glycol (MIRALAX / GLYCOLAX) packet 17 g   enoxaparin (LOVENOX) injection 40 mg   famotidine (PEPCID) tablet 20 mg   Ampicillin-Sulbactam (UNASYN) 3 g in sodium chloride 0.9 % 100 mL IVPB    Order Specific Question:   Antibiotic Indication:    Answer:   Aspiration Pneumonia   propofol (DIPRIVAN) 1000 MG/100ML infusion   fentaNYL (SUBLIMAZE) injection 25 mcg   fentaNYL in NS (90mcg/ml) infusion-PREMIX   fentaNYL (SUBLIMAZE) bolus via infusion 25-100 mcg   insulin aspart (novoLOG) injection 0-9 Units    Order Specific Question:   Correction coverage:    Answer:   Sensitive (thin, NPO, renal)    Order Specific Question:   CBG < 70:    Answer:   Implement Hypoglycemia Standing Orders and refer to Hypoglycemia Standing Orders sidebar report    Order Specific Question:   CBG 70 - 120:    Answer:   0 units    Order Specific Question:   CBG 121 - 150:    Answer:   1 unit    Order Specific Question:   CBG 151 - 200:    Answer:   2 units    Order Specific Question:   CBG 201 - 250:    Answer:   3 units    Order Specific Question:   CBG 251 - 300:    Answer:   5 units    Order Specific Question:   CBG 301 - 350:    Answer:   7 units    Order Specific Question:   CBG 351 - 400    Answer:   9 units    Order Specific Question:   CBG > 400    Answer:   call MD and obtain STAT lab verification   Chlorhexidine Gluconate Cloth 2 % PADS 6 each   DISCONTD: midazolam (VERSED) injection 2 mg   docusate (COLACE) 50 MG/5ML liquid 100 mg   DISCONTD:  atorvastatin (LIPITOR) tablet 40 mg   DISCONTD: folic acid (FOLVITE) tablet 1 mg   DISCONTD: multivitamin with minerals tablet 1 tablet  DISCONTD: thiamine (VITAMIN B1) tablet 100 mg   midazolam (VERSED) bolus via infusion 16 mg   midazolam (VERSED) 100 mg/100 mL (1 mg/mL) premix infusion   folic acid (FOLVITE) tablet 1 mg   atorvastatin (LIPITOR) tablet 40 mg   multivitamin with minerals tablet 1 tablet   DISCONTD: thiamine (VITAMIN B1) tablet 100 mg   FOLLOWED BY Linked Order Group    thiamine (VITAMIN B1) 500 mg in sodium chloride 0.9 % 50 mL IVPB    thiamine (VITAMIN B1) 250 mg in sodium chloride 0.9 % 50 mL IVPB    thiamine (VITAMIN B1) injection 100 mg   levETIRAcetam (KEPPRA) IVPB 1000 mg/100 mL premix   magnesium sulfate IVPB 2 g 50 mL   Oral care mouth rinse   Oral care mouth rinse   dextrose 50 % solution 12.5 g    Order Specific Question:   Indication    Answer:   Hypoglycemia orders for diabetic adults and all adult patients on insulin or other diabetes medications.    Order Specific Question:   Documentation    Answer:   Document in progress notes using SmartText for hypoglycemia.   potassium chloride (KLOR-CON) packet 40 mEq    -I have reviewed the patients home medicines and have made adjustments as needed  Critical interventions Intubation for airway protection, management of seizure  Consultations Obtained: I requested consultation with the neurology and ICU,  and discussed lab and imaging findings as well as pertinent plan - they recommend: Admission EEG   Cardiac Monitoring: The patient was maintained on a cardiac monitor.  I personally viewed and interpreted the cardiac monitored which showed an underlying rhythm of: Sinus rhythm  Social Determinants of Health:  Factors impacting patients care include: Poor medical compliance and multiple medical comorbidities including seizure disorder and alcohol disorder   Reevaluation: After the interventions noted  above, I reevaluated the patient and found that they have :stayed the same  Co morbidities that complicate the patient evaluation  Past Medical History:  Diagnosis Date   Alcohol abuse    Heart failure with improved ejection fraction (HFimpEF) (HCC) 02/2022   a. 02/2019 Echo: EF 60-65%, GrI DD; b. 01/2022 Echo: EF 30-35%; c. 06/2022 Echo: EF 60-65%, mild LVH, RI DD, nl RV fxn, triv MR.   Hypertension    Seizure (HCC)    Seizure disorder (HCC)       Dispostion: Admit to ICU     Final Clinical Impression(s) / ED Diagnoses Final diagnoses:  Seizure disorder (HCC)  Status epilepticus (HCC)     @PCDICTATION @    Anders Simmonds T, DO 01/27/23 [phone]

## 2023-01-27 NOTE — Progress Notes (Addendum)
Initial Nutrition Assessment  DOCUMENTATION CODES:   Not applicable  INTERVENTION:   Vital 1.2@50ml /hr- Initiate at 71ml/hr and increase by 28ml/hr q 8 hours until goal rate is reached.   ProSource TF 20- Give 60ml BID via tube, each supplement provides 80kcal and 20g of protein.   Free water flushes 30ml q4 hours to maintain tube patency   Regimen provides 1600kcal/day, 130g/day protein and 1131ml/day of free water.   Continue MVI, folic acid and thiamine daily   Pt at refeed risk; recommend monitor potassium, magnesium and phosphorus labs daily until stable  Daily weights   NUTRITION DIAGNOSIS:   Inadequate oral intake related to inability to eat (pt sedated and ventilated) as evidenced by NPO status.  GOAL:   Provide needs based on ASPEN/SCCM guidelines  MONITOR:   Vent status, Labs, Weight trends, TF tolerance, I & O's, Skin  REASON FOR ASSESSMENT:   Consult Enteral/tube feeding initiation and management  ASSESSMENT:   72 y/o male with h/o CVA, seizures, dilated cardiomyopathy, CHF, HTN, HLD, etoh abuse, marijuana use and CKD III who is admitted with seizures and AKI.  Pt sedated and ventilated. Cortrak tube placed today and is noted to be gastric. Will plan to initiate tube feeds. Per chart, pt appears fairly weight stable at baseline.   Medications reviewed and include: lovenox, pepcid, folic acid, insulin, MVI, thiamine, unasyn, propofol   Labs reviewed: K 3.7 wnl, creat 1.35(H), Mg 2.2 wnl Hgb 10.4(L), Hct 31.9(L) Cbgs- 85, 62, 90, 64 x 24hrs  AIC 5.3- 12/3  Patient is currently intubated on ventilator support MV: 6.8 L/min Temp (24hrs), Avg:97 F (36.1 C), Min:96.4 F (35.8 C), Max:97.4 F (36.3 C)  Propofol: 9.89 ml/hr- provides 261kcal/day   MAP- >75mmHg   NUTRITION - FOCUSED PHYSICAL EXAM:  Flowsheet Row Most Recent Value  Orbital Region No depletion  Upper Arm Region Moderate depletion  Thoracic and Lumbar Region No depletion  Buccal  Region No depletion  Temple Region Mild depletion  Clavicle Bone Region Moderate depletion  Clavicle and Acromion Bone Region Moderate depletion  Scapular Bone Region No depletion  Dorsal Hand Mild depletion  Patellar Region Mild depletion  Anterior Thigh Region Mild depletion  Posterior Calf Region Moderate depletion  Edema (RD Assessment) None  Hair Reviewed  Eyes Reviewed  Mouth Reviewed  Skin Reviewed  Nails Reviewed   Diet Order:   Diet Order             Diet NPO time specified  Diet effective now                  EDUCATION NEEDS:   No education needs have been identified at this time  Skin:  Skin Assessment: Reviewed RN Assessment  Last BM:  PTA  Height:   Ht Readings from Last 1 Encounters:  01/26/23 5\' 11"  (1.803 m)    Weight:   Wt Readings from Last 1 Encounters:  01/27/23 81.1 kg    Ideal Body Weight:  78.1 kg  BMI:  Body mass index is 24.94 kg/m.  Estimated Nutritional Needs:   Kcal:  1593kcal/day  Protein:  115-130g/day  Fluid:  2.0-2.3L/day  Betsey Holiday MS, RD, LDN Please refer to [facility] for RD and/or RD on-call/weekend/after hours pager

## 2023-01-27 NOTE — Procedures (Signed)
Cortrak  Tube Type:  Cortrak - 43 inches Tube Location:  Left nare Initial Placement:  Stomach Secured by: Bridle Technique Used to Measure Tube Placement:  Marking at nare/corner of mouth Cortrak Secured At:  70 cm   Cortrak Tube Team Note:  Consult received to place a Cortrak feeding tube.   X-ray is required, abdominal x-ray has been ordered by the Cortrak team. Please confirm tube placement before using the Cortrak tube.   If the tube becomes dislodged please keep the tube and contact the Cortrak team at [email or web address] for replacement.  If after hours and replacement cannot be delayed, place a NG tube and confirm placement with an abdominal x-ray.      MS, RD, LDN Please refer to [facility] for RD and/or RD on-call/weekend/after hours pager   

## 2023-01-28 DIAGNOSIS — J9601 Acute respiratory failure with hypoxia: Secondary | ICD-10-CM | POA: Diagnosis not present

## 2023-01-28 DIAGNOSIS — G40901 Epilepsy, unspecified, not intractable, with status epilepticus: Principal | ICD-10-CM

## 2023-01-28 DIAGNOSIS — J9602 Acute respiratory failure with hypercapnia: Secondary | ICD-10-CM | POA: Diagnosis not present

## 2023-01-28 DIAGNOSIS — G9341 Metabolic encephalopathy: Secondary | ICD-10-CM | POA: Diagnosis not present

## 2023-01-28 DIAGNOSIS — G40909 Epilepsy, unspecified, not intractable, without status epilepticus: Secondary | ICD-10-CM | POA: Diagnosis not present

## 2023-01-28 LAB — COMPREHENSIVE METABOLIC PANEL
ALT: 9 U/L (ref 0–44)
AST: 19 U/L (ref 15–41)
Albumin: 2.6 g/dL — ABNORMAL LOW (ref 3.5–5.0)
Alkaline Phosphatase: 46 U/L (ref 38–126)
Anion gap: 8 (ref 5–15)
BUN: 16 mg/dL (ref 8–23)
CO2: 22 mmol/L (ref 22–32)
Calcium: 8.7 mg/dL — ABNORMAL LOW (ref 8.9–10.3)
Chloride: 111 mmol/L (ref 98–111)
Creatinine, Ser: 1.25 mg/dL — ABNORMAL HIGH (ref 0.61–1.24)
GFR, Estimated: >60 mL/min (ref >60)
Glucose, Bld: 109 mg/dL — ABNORMAL HIGH (ref 70–99)
Potassium: 3.5 mmol/L (ref 3.5–5.1)
Sodium: 141 mmol/L (ref 135–145)
Total Bilirubin: 0.5 mg/dL (ref <1.2)
Total Protein: 5.6 g/dL — ABNORMAL LOW (ref 6.5–8.1)

## 2023-01-28 LAB — PHENYTOIN LEVEL, TOTAL: Phenytoin Lvl: 13.9 ug/mL (ref 10.0–20.0)

## 2023-01-28 LAB — CBC
HCT: 32 % — ABNORMAL LOW (ref 39.0–52.0)
Hemoglobin: 10.3 g/dL — ABNORMAL LOW (ref 13.0–17.0)
MCH: 29.3 pg (ref 26.0–34.0)
MCHC: 32.2 g/dL (ref 30.0–36.0)
MCV: 91.2 fL (ref 80.0–100.0)
Platelets: 120 10*3/uL — ABNORMAL LOW (ref 150–400)
RBC: 3.51 MIL/uL — ABNORMAL LOW (ref 4.22–5.81)
RDW: 14.5 % (ref 11.5–15.5)
WBC: 7.2 10*3/uL (ref 4.0–10.5)
nRBC: 0 % (ref 0.0–0.2)

## 2023-01-28 LAB — GLUCOSE, CAPILLARY
Glucose-Capillary: 96 mg/dL (ref 70–99)
Glucose-Capillary: 91 mg/dL (ref 70–99)
Glucose-Capillary: 99 mg/dL (ref 70–99)
Glucose-Capillary: 139 mg/dL — ABNORMAL HIGH (ref 70–99)
Glucose-Capillary: 106 mg/dL — ABNORMAL HIGH (ref 70–99)
Glucose-Capillary: 124 mg/dL — ABNORMAL HIGH (ref 70–99)

## 2023-01-28 LAB — PHOSPHORUS
Phosphorus: 2.2 mg/dL — ABNORMAL LOW (ref 2.5–4.6)
Phosphorus: 3.5 mg/dL (ref 2.5–4.6)

## 2023-01-28 LAB — MAGNESIUM
Magnesium: 2 mg/dL (ref 1.7–2.4)
Magnesium: 2.1 mg/dL (ref 1.7–2.4)

## 2023-01-28 MED ORDER — POLYETHYLENE GLYCOL 3350 17 G PO PACK
17.0000 g | PACK | Freq: Every day | ORAL | Status: DC
  Administered 2023-01-28 – 2023-01-30 (×4): 17 g
  Filled 2023-01-28 (×4): qty 1

## 2023-01-28 MED ORDER — PHENOBARBITAL 32.4 MG PO TABS
129.6000 mg | ORAL_TABLET | Freq: Three times a day (TID) | ORAL | Status: AC
  Administered 2023-01-28 – 2023-01-29 (×4): 129.6 mg
  Filled 2023-01-28 (×4): qty 4

## 2023-01-28 MED ORDER — PHENOBARBITAL 32.4 MG PO TABS
64.8000 mg | ORAL_TABLET | Freq: Three times a day (TID) | ORAL | Status: DC
  Administered 2023-01-30 – 2023-01-31 (×5): 64.8 mg
  Filled 2023-01-28 (×6): qty 2

## 2023-01-28 MED ORDER — PHENYTOIN SODIUM 50 MG/ML IJ SOLN
100.0000 mg | Freq: Three times a day (TID) | INTRAMUSCULAR | Status: DC
  Administered 2023-01-28 – 2023-02-02 (×15): 100 mg via INTRAVENOUS
  Filled 2023-01-28 (×15): qty 2

## 2023-01-28 MED ORDER — SODIUM CHLORIDE 0.9 % IV SOLN
1500.0000 mg | Freq: Once | INTRAVENOUS | Status: AC
  Administered 2023-01-28: 1500 mg via INTRAVENOUS
  Filled 2023-01-28: qty 30

## 2023-01-28 MED ORDER — POTASSIUM CHLORIDE 20 MEQ PO PACK
40.0000 meq | PACK | Freq: Once | ORAL | Status: AC
Start: 2023-01-28 — End: 2023-01-28
  Administered 2023-01-28: 40 meq
  Filled 2023-01-28: qty 2

## 2023-01-28 MED ORDER — SODIUM CHLORIDE 0.9 % IV SOLN
260.0000 mg | INTRAVENOUS | Status: AC
  Administered 2023-01-28: 260 mg via INTRAVENOUS
  Filled 2023-01-28: qty 2

## 2023-01-28 MED ORDER — POTASSIUM PHOSPHATES 15 MMOLE/5ML IV SOLN
15.0000 mmol | Freq: Once | INTRAVENOUS | Status: AC
  Administered 2023-01-28: 15 mmol via INTRAVENOUS
  Filled 2023-01-28: qty 5

## 2023-01-28 NOTE — Progress Notes (Signed)
[facility] ADULT ICU REPLACEMENT PROTOCOL   The patient does apply for the [facility] Adult ICU Electrolyte Replacment Protocol based on the criteria listed below:   1.Exclusion criteria: TCTS, ECMO, Dialysis, and Myasthenia Gravis patients 2. Is GFR >/= 30 ml/min? Yes.    Patient's GFR today is >60 3. Is SCr </= 2? Yes.   Patient's SCr is 1.25 mg/dL 4. Did SCr increase >/= 0.5 in 24 hours? No. 5.Pt's weight >40kg  Yes.   6. Abnormal electrolyte(s): POtassium  7. Electrolytes replaced per protocol 8.  Call MD STAT for K+ </= 2.5, Phos </= 1, or Mag </= 1 Physician:  Dr. Faye Ramsay A Nneoma Harral 01/28/2023 6:08 AM

## 2023-01-28 NOTE — Procedures (Addendum)
Patient Name: Marvell Tollefson  MRN: [id number]  Epilepsy Attending: Charlsie Quest  Referring Physician/Provider: Gordy Councilman, MD  Duration: 01/27/2023 1126 to 01/28/2023 1126   Patient history: 72 year old with prior CVA, seizure disorder, LKW 8:30 AM, witnessed seizure activity getting eeg to evaluate for seizure   Level of alertness:  comatose   AEDs during EEG study:  Versed, LEV, Phenobarb   Technical aspects: This EEG study was done with scalp electrodes positioned according to the 10-20 International system of electrode placement. Electrical activity was reviewed with band pass filter of 1-70Hz , sensitivity of 7 uV/mm, display speed of 49mm/sec with a 60Hz  notched filter applied as appropriate. EEG data were recorded continuously and digitally stored.  Video monitoring was available and reviewed as appropriate.   Description: At the beginning of the study, EEG showed continuous generalized and lateralized left hemisphere 3 to 6 Hz delta slowing.  Gradually after around 1100 on 01/27/2023 as sedation was weaned, EEG showed lateralized periodic discharges at times with overriding fast activity in left hemisphere at 1 to 1.5 Hz.  After around 1800 on 02/04/2023, EEG continued to worsen and showed seizures without clinical signs arising from left hemisphere.  During seizures, EEG showed low amplitude 13 to 15 Hz beta activity and left hemisphere which gradually evolved into sharply contoured 9 to 10 Hz alpha activity followed by 3-5hz  theta- and delta slowing.  Average 20 seizures were noted per hour, lasting about 45 seconds each.  Hyperventilation and photic stimulation were not performed.    Event button was pressed on 01/27/23 at 1536.  Patient had twitching involving eye and face which then spread to involve both eyes and jaw.  Concomitant EEG showed lateralized periodic discharges ( LPDs) with overriding fast activity in left hemisphere at 1 to 1.5 Hz.  Given clinical change as well as  change in EEG ( LPDs were not noted before), this is most likely an ictal pattern/focal seizure arising from left hemisphere  Event button was pressed on 12//2024 at 1952 and 2145 for twitching in the lower jaw.  Concomitant EEG showed lateralized periodic discharges at 1 to 1.5 Hz with overlying rhythmicity.  This is most likely focal seizure arising from left hemisphere   ABNORMALITY -Focal seizure, left hemisphere -Lateralized periodic discharges with overriding fast activity, left hemisphere -Continuous slow, generalized and lateralized left hemisphere   IMPRESSION: This study was initially suggestive of cortical dysfunction in left hemisphere likely secondary to underlying structural abnormality, postictal state. Additionally there was severe diffuse encephalopathy. Gradually as sedation was weaned, after around 1100 on 01/27/2023 EEG showed evidence of epileptogenicity arising from left hemisphere with increased risk of seizures.  After around 1800, EEG continued to worsen and showed seizures without clinical signs arising from left hemisphere, average 20 seizures per hour lasting about 45 seconds each.  Event button was pressed on 01/27/2023 at 1536 during which patient was noted to have twitching of face and eye which then involve the jaw.  EEG was consistent with focal seizure arising from left hemisphere.  Event button was again pressed on 01/27/2023 at 1952 and 2145 for continued lower jaw.  EEG was consistent with focal seizures in the left hemisphere.   ICU team and Dr.Bhagat were notified   Sinan Tuch Annabelle Harman

## 2023-01-28 NOTE — Progress Notes (Signed)
   01/28/23 0758  Daily Weaning Assessment  Daily Assessment of Readiness to Wean Wean protocol criteria met (SBT performed)  SBT Method CPAP 5 cm H20 and PS 5 cm H20 (PS 8)  Weaning Start Time 0758   Pt was placed on PS/CPAP 8/5 30% and is tolerating well at this time.

## 2023-01-28 NOTE — TOC CM/SW Note (Signed)
Transition of Care [facility]) - Inpatient Brief Assessment   Patient Details  Name: John Spears MRN: [id number] Date of Birth: 31-Jan-1951  Transition of Care [facility]) CM/SW Contact:    Mearl Latin, LCSW Phone Number: 01/28/2023, 10:09 AM   Clinical Narrative: Patient admitted from home with ETOH and seizure history. He had a recent stay at [facility] (went there on 11/6 and discharged home). TOC will continue to follow for needs. Patient is currently intubated.    Transition of Care Asessment: Insurance and Status: Insurance coverage has been reviewed Patient has primary care physician: No Home environment has been reviewed: From home Prior level of function:: Independent Prior/Current Home Services: No current home services Social Determinants of Health Reivew: SDOH reviewed no interventions necessary Readmission risk has been reviewed: Yes Transition of care needs: transition of care needs identified, TOC will continue to follow

## 2023-01-28 NOTE — Progress Notes (Addendum)
Phenytoin Initial Consult Indication: status epilepticus   No Known Allergies  Patient Measurements: Height: 5\' 11"  (180.3 cm) Weight: 77.8 kg (171 lb 8.3 oz) IBW/kg (Calculated) : 75.3 TPN AdjBW (KG): 82.4 Body mass index is 23.92 kg/m.   Vital signs: Temp: 98.1 F (36.7 C) (12/05 1600) Temp Source: Axillary (12/05 1600) BP: 145/68 (12/05 1800) Pulse Rate: 62 (12/05 1800)  Labs: Lab Results  Component Value Date/Time   Albumin 2.6 (L) 01/28/2023 0448   Phenytoin Lvl 13.9 01/28/2023 1609   Lab Results  Component Value Date   PHENYTOIN 13.9 01/28/2023   Estimated Creatinine Clearance: 58.6 mL/min (A) (by C-G formula based on SCr of 1.25 mg/dL (H)).    Assessment: 72 yo M with status epilepticus. Pt is receiving keppra (max dose for renal function), phenobarbital taper for AWS, midazolam continuous infusion (titrated by MD). Pharmacy consulted for phenytoin dosing for seizure control.   Pt received fosphenytoin 1500mg  IV x1 as loading dose on 12/5 at 1238. Obtaining post-load level per MD.   Albumin 2.6 (01/28/23) Scr 1.25, CrCl 58.42ml/min today   Corrected phenytoin level: 22.4 mcg/m, slightly high post load Seizure activity: on cEEG  Significant potential drug interactions: Phenobarbital taper for alcohol withdrawal + seizures   Goals of care:  Total phenytoin level: 10-20 mcg/ml Free phenytoin level: 1-2 mcg/ml (send-out)   Plan:  Begin maintenance dose of phenytoin of: 100mg  IV Q8H Will obtain steady state levels and PRN  F/u any further drug interactions, renal function, albumin  Pharmacy will continue to follow regarding obtaining total phenytoin levels and dose adjustments as indicated.  Nasir Bright D. Laney Potash, PharmD, BCPS, BCCCP 01/28/2023, 6:38 PM

## 2023-01-28 NOTE — Progress Notes (Addendum)
1952- During mouth care/stimulus, patient developed facial twitching which progressed to full body twitching movement. EEG button was pressed, and Neuro MD made aware. New order to give the patient a 4 mg bolus of versed via the continuous infusion and increase the rate from 2mg /hr to 4mg /hr.  2146- Another episode of twitching post stimulus, EEG button pressed and Neuro MD made aware. No new orders.  0010- Another episode of twitching post stimulus, EEG button pressed and Neuro MD made aware, verbal order to increase continuous versed rate to 6mg /hr.

## 2023-01-28 NOTE — Progress Notes (Addendum)
NEUROLOGY CONSULT FOLLOW UP NOTE   Date of service: January 28, 2023 Patient Name: John Spears MRN:  [id number] DOB:  Sep 05, 1950  Brief HPI  John Spears is a 72 y.o. male  has a past medical history of Alcohol abuse, Heart failure with improved ejection fraction (HFimpEF) (HCC) (02/2022), Hypertension, Seizure (HCC), and Seizure disorder (HCC). who presented with breakthrough seizures    Interval Hx/subjective  -Slightly more reactive today but still no meaningful communication -Nursing concerned about some episodes of right face twitching spreading to the arm and leg  Vitals   Current vital signs: BP (!) 131/57   Pulse (!) 59   Temp 98.5 F (36.9 C) (Axillary)   Resp 14   Ht 5\' 11"  (1.803 m)   Wt 77.8 kg   SpO2 100%   BMI 23.92 kg/m  Vital signs in last 24 hours: Temp:  [97.3 F (36.3 C)-99.4 F (37.4 C)] 98.5 F (36.9 C) (12/05 0400) Pulse Rate:  [49-87] 59 (12/05 0730) Resp:  [10-28] 14 (12/05 0730) BP: (88-168)/(43-80) 131/57 (12/05 0730) SpO2:  [100 %] 100 % (12/05 0730) FiO2 (%):  [30 %-40 %] 30 % (12/05 0442) Weight:  [77.8 kg] 77.8 kg (12/05 0500)   Body mass index is 23.92 kg/m.  Physical Exam   Constitutional: Appears chronically ill  Psych: Affect appropriate to situation.  Eyes: No scleral injection.  HENT: ETT in place   Head: Normocephalic. EEG in place  Cardiovascular: Mild sinus bradycardia Respiratory: Effort normal, non-labored breathing on vent GI: Soft.  No distension. There is no tenderness.  Neurologic Examination   PERRL, gaze midline, symmetric corneal to eyelash brush. Brisk grimace to noxious stim on the bilaterally, trace flicker in the LUE to noxious stim (palpable, not visible). Not following commands, not opening eyes to voice or noxious stim.   Medications  Current Facility-Administered Medications:    Ampicillin-Sulbactam (UNASYN) 3 g in sodium chloride 0.9 % 100 mL IVPB, 3 g, Intravenous, Q6H, Simonne Martinet,  NP, Stopped at 01/28/23 0600   atorvastatin (LIPITOR) tablet 40 mg, 40 mg, Per Tube, Daily, Calton Dach I, RPH, 40 mg at 01/27/23 0935   bethanechol (URECHOLINE) tablet 10 mg, 10 mg, Per Tube, TID, Cheri Fowler, MD, 10 mg at 01/27/23 2137   Chlorhexidine Gluconate Cloth 2 % PADS 6 each, 6 each, Topical, Daily, Oretha Milch, MD, 6 each at 01/27/23 0245   docusate (COLACE) 50 MG/5ML liquid 100 mg, 100 mg, Per Tube, BID PRN, Oretha Milch, MD   enoxaparin (LOVENOX) injection 40 mg, 40 mg, Subcutaneous, Q24H, Autry, Lauren E, PA-C, 40 mg at 01/27/23 1147   famotidine (PEPCID) tablet 20 mg, 20 mg, Per Tube, BID, Cherlynn Polo, Lauren E, PA-C, 20 mg at 01/27/23 2137   feeding supplement (PROSource TF20) liquid 60 mL, 60 mL, Per Tube, BID, Cheri Fowler, MD, 60 mL at 01/27/23 2137   feeding supplement (VITAL AF 1.2 CAL) liquid 1,000 mL, 1,000 mL, Per Tube, Continuous, Cheri Fowler, MD, Last Rate: 40 mL/hr at 01/28/23 0700, Infusion Verify at 01/28/23 0700   fentaNYL (SUBLIMAZE) bolus via infusion 25-100 mcg, 25-100 mcg, Intravenous, Q15 min PRN, Cherlynn Polo, Lauren E, PA-C   fentaNYL (SUBLIMAZE) injection 25 mcg, 25 mcg, Intravenous, Once, Autry, Lauren E, PA-C   fentaNYL in NS (57mcg/ml) infusion-PREMIX, 25-200 mcg/hr, Intravenous, Continuous, Autry, Lauren E, PA-C, Stopped at 01/27/23 0502   folic acid (FOLVITE) tablet 1 mg, 1 mg, Per Tube, Daily, Calton Dach I, RPH, 1 mg  at 01/27/23 0935   insulin aspart (novoLOG) injection 0-9 Units, 0-9 Units, Subcutaneous, Q4H, Autry, Lauren E, PA-C   levETIRAcetam (KEPPRA) IVPB 1000 mg/100 mL premix, 1,000 mg, Intravenous, Q12H, Fran Mcree L, MD, Paused at 01/27/23 2003   midazolam (VERSED) 100 mg/100 mL (1 mg/mL) premix infusion, 0-8 mg/hr, Intravenous, Continuous, Chand, Garnet Sierras, MD, Last Rate: 6 mL/hr at 01/28/23 0700, 6 mg/hr at 01/28/23 0700   midazolam (VERSED) bolus via infusion 16 mg, 16 mg, Intravenous, Q5 min PRN, Ishani Goldwasser L,  MD   multivitamin with minerals tablet 1 tablet, 1 tablet, Per Tube, Daily, Calton Dach I, RPH, 1 tablet at 01/27/23 0935   Oral care mouth rinse, 15 mL, Mouth Rinse, Q2H, Oretha Milch, MD, 15 mL at 01/28/23 7829   Oral care mouth rinse, 15 mL, Mouth Rinse, PRN, Oretha Milch, MD   [COMPLETED] PHENObarbital (LUMINAL) 260 mg in sodium chloride 0.9 % 100 mL IVPB, 260 mg, Intravenous, Once, Stopped at 01/27/23 1811 **FOLLOWED BY** PHENObarbital (LUMINAL) injection 130 mg, 130 mg, Intravenous, Q8H, 130 mg at 01/28/23 0004 **FOLLOWED BY** [START ON 01/30/2023] PHENObarbital (LUMINAL) injection 65 mg, 65 mg, Intravenous, BID, Simonne Martinet, NP   polyethylene glycol (MIRALAX / GLYCOLAX) packet 17 g, 17 g, Per Tube, Daily PRN, Cherlynn Polo, Lauren E, PA-C   potassium chloride (KLOR-CON) packet 40 mEq, 40 mEq, Per Tube, Once, Paliwal, Aditya, MD   propofol (DIPRIVAN) 1000 MG/100ML infusion, 0-50 mcg/kg/min, Intravenous, Continuous, Cherlynn Polo, Lauren E, PA-C, Stopped at 01/27/23 1012   thiamine (VITAMIN B1) 500 mg in sodium chloride 0.9 % 50 mL IVPB, 500 mg, Intravenous, Q8H, Stopped at 01/28/23 0640 **FOLLOWED BY** [START ON 01/29/2023] thiamine (VITAMIN B1) 250 mg in sodium chloride 0.9 % 50 mL IVPB, 250 mg, Intravenous, Daily **FOLLOWED BY** [START ON 02/04/2023] thiamine (VITAMIN B1) injection 100 mg, 100 mg, Intravenous, Daily, Latifah Padin L, MD Labs and Diagnostic Imaging    Basic Metabolic Panel: Recent Labs  Lab 01/26/23 0954 01/26/23 1000 01/26/23 1313 01/27/23 0522 01/27/23 1017 01/27/23 1649 01/27/23 1707 01/28/23 0448  NA 138 140 140 140  --  142  --  141  K 4.2 4.2 4.0 3.7  --  4.1  --  3.5  CL 106 111  --  109  --   --   --  111  CO2 18*  --   --  21*  --   --   --  22  GLUCOSE 85 86  --  87  --   --   --  109*  BUN 20 21  --  20  --   --   --  16  CREATININE 1.38* 1.30*  --  1.35*  --   --   --  1.25*  CALCIUM 8.9  --   --  8.4*  --   --   --  8.7*  MG 1.6*  --   --  2.2 2.1   --  2.2 2.0  PHOS  --   --   --   --  2.7  --  2.6 2.2*    CBC: Recent Labs  Lab 01/26/23 0954 01/26/23 1000 01/26/23 1313 01/27/23 0522 01/27/23 1649 01/28/23 0448  WBC 7.7  --   --  6.6  --  7.2  NEUTROABS 5.2  --   --   --   --   --   HGB 11.9* 13.3 11.6* 10.4* 11.2* 10.3*  HCT 37.8* 39.0 34.0* 31.9* 33.0* 32.0*  MCV  93.6  --   --  90.9  --  91.2  PLT 144*  --   --  107*  --  120*    Coagulation Studies: Recent Labs    01/26/23 0954  LABPROT 14.0  INR 1.1      Lipid Panel:  Lab Results  Component Value Date   LDLCALC 133 (H) 03/08/2019   HgbA1c:  Lab Results  Component Value Date   HGBA1C 5.3 01/26/2023   Urine Drug Screen:     Component Value Date/Time   LABOPIA NONE DETECTED 01/27/2023 1800   COCAINSCRNUR NONE DETECTED 01/27/2023 1800   COCAINSCRNUR NONE DETECTED 02/20/2022 1652   LABBENZ POSITIVE (A) 01/27/2023 1800   AMPHETMU NONE DETECTED 01/27/2023 1800   THCU NONE DETECTED 01/27/2023 1800   LABBARB POSITIVE (A) 01/27/2023 1800    Alcohol Level     Component Value Date/Time   ETH <10 01/26/2023 0954   INR  Lab Results  Component Value Date   INR 1.1 01/26/2023   APTT  Lab Results  Component Value Date   APTT 24 01/26/2023    CT Head without contrast(Personally reviewed): No hemorrhage or CT evidence of an acute cortical infarct.  EEG 01/26/2023 1126 to 01/27/2023 1000   IMPRESSION: This study is suggestive of cortical dysfunction in left hemisphere likely secondary underlying structural abnormality.  Additionally there is severe diffuse encephalopathy likely related to sedation.  No seizures or epileptiform discharges were seen during this time.  Assessment   Status epilepticus has resolved, exam slightly improved and c/w post-ictal Todd's at this time.  Recommendations  -Continue EEG -Pending EEG read, will again try to wean versed  -Keppra load 4.5 mg loading dose; continue home dose of 1000 mg BID (max for current renal  function, also home dose) -Given RBBB, avoiding vimpat -Given liver dysfunction and low platelets, caution with depakote  -Appreciate phenobarb taper per ICU team; may increase dose if still having seizures -Appreciate CCM management of comorbidities  -Neurology will follow along  Addendum:  EEG with up to 20 sz/hr after versed weaned and push button events correlating with focal seizures   - Bolus versed 16 mg x 1 dose; increase rate to 16 mg/hr from 5 mg/hr - Load fosphenytoin once and check post load level, hold off on maintenance for now - Increase phenobarb taper slightly, appreciate pharmacy calculations ~12mg /kg will be achieved by end of day - Will follow closely, discussed with CCM, pharmacy and Dr. Melynda Ripple  ______________________________________________________________________   Brooke Dare MD-PhD [facility] [phone]  CRITICAL CARE Performed by: Gordy Councilman   Total critical care time: 50 minutes  Critical care time was exclusive of separately billable procedures and treating other patients.  Critical care was necessary to treat or prevent imminent or life-threatening deterioration.  Critical care was time spent personally by me on the following activities: development of treatment plan with patient and/or surrogate as well as nursing, discussions with consultants, evaluation of patient's response to treatment, examination of patient, obtaining history from patient or surrogate, ordering and performing treatments and interventions, ordering and review of laboratory studies, ordering and review of radiographic studies, pulse oximetry and re-evaluation of patient's condition.

## 2023-01-28 NOTE — Progress Notes (Signed)
NAME:  John Spears, MRN:  [id number], DOB:  Mar 25, 1950, LOS: 2 ADMISSION DATE:  01/26/2023, CONSULTATION DATE:  01/26/2023 REFERRING MD:  Andria Meuse, EDP, CHIEF COMPLAINT:  seizure   History of Present Illness:  72 year old male with past medical history of heart failure with recovered EF, MCA stroke, hypertension, alcohol use disorder, seizure disorder who presented to the ED via EMS with seizure, altered mental status. Patient unable to participate in history, therefore HPI obtained from ED, neurology notes. Per chart review, patient was up this AM making his bed when sister, Kathie Rhodes, heard a fall/loud noise around 0830. She found patient lying on the floor and called 911 with concern of seizure activity. Notes he has been compliant with AEDs, unclear on his alcohol use, and eating "okay." Per ED triage notes, EMS noted seizure activity, right tremor, leftward gaze. He was given 5mg  versed which did not break seizure activity, and given subsequent 2.5mg  with abatement. He was intubated immediately upon arrival. He was called a code stroke and taken to CT with no evidence of bleed. He was loaded with keppra and started on propofol/fentanyl and admitted to [facility].   Pertinent  Medical History  heart failure with recovered EF, MCA stroke, hypertension, alcohol use disorder, seizure disorder  Significant Hospital Events: Including procedures, antibiotic start and stop dates in addition to other pertinent events   12/3: admitted to ICU after seizure, altered mental status. CT head negative. Seen by neuro. LTM started.  12/4 heavily sedated no obvious seizure. Prop was weaned off. Then versed. Had break thru seizure w/ worse exam. Got bolus of versed f/b resuming gtt and also started pheno load and taper 12/5 on a little higher versed than day prior. Still on LTM. Exam perhaps a little better BUT still having breakthru seizure versed inc'd  Interim History / Subjective:  Perhaps a little more awake But  having [place] seizure on LTM Objective   Blood pressure 129/65, pulse 66, temperature 98.5 F (36.9 C), temperature source Axillary, resp. rate 14, height 5\' 11"  (1.803 m), weight 77.8 kg, SpO2 100%.    Vent Mode: PSV;CPAP FiO2 (%):  [30 %-40 %] 30 % Set Rate:  [14 bmp] 14 bmp Vt Set:  [550 mL] 550 mL PEEP:  [5 cmH20] 5 cmH20 Pressure Support:  [8 cmH20] 8 cmH20 Plateau Pressure:  [16 cmH20-17 cmH20] 16 cmH20   Intake/Output Summary (Last 24 hours) at 01/28/2023 0930 Last data filed at 01/28/2023 0900 Gross per 24 hour  Intake 1580.75 ml  Output 900 ml  Net 680.75 ml   Filed Weights   01/26/23 1000 01/27/23 0500 01/28/23 0500  Weight: 82.4 kg 81.1 kg 77.8 kg    Examination: General sedated on versed gtt but overall a little more responsive today  HENT NCAT orally intubated Pulm clear equal chest rise Card rrr Abd soft Ext warm  Gu cl  Neuro grimaces to nox stim. I did see flicker of LUE movement. Opened eyes once after noxious stim. Not moving right side   Resolved Hospital Problem list     Assessment & Plan:  Acute metabolic encephalopathy s/p Break thru Seizures w/ remote h/o stroke exacerbated by ETOH and prob ETOH wd seizure and cont to have subclinical seizures per LTM on 12/5 Plan AEDs per neuro Added Phenobarb taper given h/o active ETOH and concern for etoh related wd seizure  Cont LTM, inc versed per neuro Giving additional pheno dose  Serial neuro checks  Seizure precautions Keep euglycemic  Avoid fevers  Acute respiratory failure due to acute encephalopathy and seizures c/b aspiration PNA  Cxr improved Mental status barrier to weaning Plan Cont full vent support, initiate assessment for wean once off versed VAP bundle  Day 3/5 unasyn  F/u pending culture (prelim few GPC)  Heart failure; most recent echo 06/2022 with EF 60-65%, mild LVH, G1DD  Plan hold carvedilol, lasix for now, restart when clinically appropriate (BP still boarderline d/t versed)    AKI. improving Plan Strick I&O Renal dose meds Keep euvolemic Am chem   Hypophosphatemia  Plan Replace   Hyperlipidemia and h/o Hypertension  Plan Cont statin Antihypertensives on hold as BP boarderline   Alcohol use disorder; ethanol negative in ED. Unclear last drink  - on propofol for now   Plan Cont thiamine, folic acid, and MVI   Best Practice (right click and "Reselect all SmartList Selections" daily)   Diet/type: tubefeeds DVT prophylaxis: LMWH Pressure ulcer(s). pressure injury stage n/a was NOT present on admission GI prophylaxis: H2B Lines: N/A Foley:  Yes, and it is still needed Code Status:   full code Last date of multidisciplinary goals of care discussion [attempting to touch base with family]    Critical care time: 50 min

## 2023-01-29 DIAGNOSIS — R4182 Altered mental status, unspecified: Secondary | ICD-10-CM

## 2023-01-29 DIAGNOSIS — J9601 Acute respiratory failure with hypoxia: Secondary | ICD-10-CM | POA: Diagnosis not present

## 2023-01-29 DIAGNOSIS — E785 Hyperlipidemia, unspecified: Secondary | ICD-10-CM

## 2023-01-29 DIAGNOSIS — I10 Essential (primary) hypertension: Secondary | ICD-10-CM

## 2023-01-29 DIAGNOSIS — G40901 Epilepsy, unspecified, not intractable, with status epilepticus: Secondary | ICD-10-CM | POA: Diagnosis not present

## 2023-01-29 DIAGNOSIS — G40909 Epilepsy, unspecified, not intractable, without status epilepticus: Secondary | ICD-10-CM | POA: Diagnosis not present

## 2023-01-29 LAB — CBC
HCT: 30.1 % — ABNORMAL LOW (ref 39.0–52.0)
Hemoglobin: 9.6 g/dL — ABNORMAL LOW (ref 13.0–17.0)
MCH: 29.4 pg (ref 26.0–34.0)
MCHC: 31.9 g/dL (ref 30.0–36.0)
MCV: 92 fL (ref 80.0–100.0)
Platelets: 93 10*3/uL — ABNORMAL LOW (ref 150–400)
RBC: 3.27 MIL/uL — ABNORMAL LOW (ref 4.22–5.81)
RDW: 14.7 % (ref 11.5–15.5)
WBC: 9.7 10*3/uL (ref 4.0–10.5)
nRBC: 0 % (ref 0.0–0.2)

## 2023-01-29 LAB — GLUCOSE, CAPILLARY
Glucose-Capillary: 102 mg/dL — ABNORMAL HIGH (ref 70–99)
Glucose-Capillary: 103 mg/dL — ABNORMAL HIGH (ref 70–99)
Glucose-Capillary: 108 mg/dL — ABNORMAL HIGH (ref 70–99)
Glucose-Capillary: 113 mg/dL — ABNORMAL HIGH (ref 70–99)
Glucose-Capillary: 136 mg/dL — ABNORMAL HIGH (ref 70–99)
Glucose-Capillary: 94 mg/dL (ref 70–99)

## 2023-01-29 LAB — BASIC METABOLIC PANEL
Anion gap: 9 (ref 5–15)
BUN: 22 mg/dL (ref 8–23)
CO2: 20 mmol/L — ABNORMAL LOW (ref 22–32)
Calcium: 8.4 mg/dL — ABNORMAL LOW (ref 8.9–10.3)
Chloride: 117 mmol/L — ABNORMAL HIGH (ref 98–111)
Creatinine, Ser: 1.04 mg/dL (ref 0.61–1.24)
GFR, Estimated: >60 mL/min (ref >60)
Glucose, Bld: 104 mg/dL — ABNORMAL HIGH (ref 70–99)
Potassium: 4.6 mmol/L (ref 3.5–5.1)
Sodium: 146 mmol/L — ABNORMAL HIGH (ref 135–145)

## 2023-01-29 LAB — CULTURE, RESPIRATORY W GRAM STAIN: Culture: NORMAL

## 2023-01-29 LAB — PHOSPHORUS: Phosphorus: 2.7 mg/dL (ref 2.5–4.6)

## 2023-01-29 LAB — MAGNESIUM: Magnesium: 2 mg/dL (ref 1.7–2.4)

## 2023-01-29 NOTE — Progress Notes (Signed)
   01/29/23 1955  Vent Select  Invasive or Noninvasive Invasive  Adult Vent Y  Airway 8 mm  Placement Date/Time: 01/26/23 0955   Placed By: ED Physician  Airway Device: Endotracheal Tube  Laryngoscope Blade: MAC;4  ETT Types: Oral  Size (mm): 8 mm  Cuffed: Cuffed  Insertion attempts: 1  Airway Equipment: Video Laryngoscope  Placement Confirm...  Secured at (cm) 27 cm  Measured From Lips  Secured Location Center  Secured By Commercial Tube Holder  Tube Holder Repositioned Yes  Prone position No  Cuff Pressure (cm H2O) Green OR 18-26 CmH2O  Adult Ventilator Settings  Vent Type Servo i  Humidity HME  Vent Mode PRVC  Vt Set 550 mL  Set Rate 14 bmp  FiO2 (%) 30 %  I Time 0.9 Sec(s)  PEEP 5 cmH20  Adult Ventilator Measurements  Peak Airway Pressure 13 L/min  Mean Airway Pressure 7 cmH20  Plateau Pressure 15 cmH20  Resp Rate Spontaneous 10 br/min  Resp Rate Total 24 br/min  Exhaled Vt 439 mL  Measured Ve 5.9 L  I:E Ratio Measured 1:2.3  Auto PEEP 0 cmH20  Total PEEP 5 cmH20  Adult Ventilator Alarms  Alarms On Y  Ve High Alarm 20 L/min  Ve Low Alarm 5 L/min  Resp Rate High Alarm 38 br/min  Resp Rate Low Alarm 8  PEEP Low Alarm 3 cmH2O  Press High Alarm 45 cmH2O  T Apnea 20 sec(s)  VAP Prevention  HOB> 30 Degrees Y  Equipment wiped down Yes  Daily Weaning Assessment  Daily Assessment of Readiness to Wean Wean protocol criteria met (SBT performed)  Vent Respiratory Assessment  Respiratory Pattern Regular;Unlabored  Airway Suctioning/Secretions  Suction Type ETT  Suction Device  Catheter  Secretion Amount Small  Secretion Color White;Yellow  Secretion Consistency Thick;Thin  Suction Tolerance Tolerated well  Suctioning Adverse Effects None   Placed pt on full support mode to rest for the night

## 2023-01-29 NOTE — Progress Notes (Signed)
Subjective: No further seizures overnight.  ROS: Unable to obtain due to poor mental status  Examination  Vital signs in last 24 hours: Temp:  [98.1 F (36.7 C)-99.3 F (37.4 C)] 98.7 F (37.1 C) (12/06 0803) Pulse Rate:  [53-68] 60 (12/06 1000) Resp:  [11-22] 19 (12/06 1000) BP: (120-167)/(56-73) 150/66 (12/06 1000) SpO2:  [100 %] 100 % (12/06 1000) FiO2 (%):  [30 %] 30 % (12/06 0803)  General: lying in bed, intubated Neuro: On Versed at 10 mL/h, comatose, does not open eyes to noxious stimuli, on passive eye opening pupils are equally round and reactive to light, corneal reflex intact, cough reflex intact, does not withdraw to noxious stimuli in bilateral upper extremities, subtle withdrawal in bilateral lower extremities  Basic Metabolic Panel: Recent Labs  Lab 01/26/23 0954 01/26/23 1000 01/26/23 1313 01/27/23 0522 01/27/23 1017 01/27/23 1649 01/27/23 1707 01/28/23 0448 01/28/23 1609  NA 138 140 140 140  --  142  --  141  --   K 4.2 4.2 4.0 3.7  --  4.1  --  3.5  --   CL 106 111  --  109  --   --   --  111  --   CO2 18*  --   --  21*  --   --   --  22  --   GLUCOSE 85 86  --  87  --   --   --  109*  --   BUN 20 21  --  20  --   --   --  16  --   CREATININE 1.38* 1.30*  --  1.35*  --   --   --  1.25*  --   CALCIUM 8.9  --   --  8.4*  --   --   --  8.7*  --   MG 1.6*  --   --  2.2 2.1  --  2.2 2.0 2.1  PHOS  --   --   --   --  2.7  --  2.6 2.2* 3.5    CBC: Recent Labs  Lab 01/26/23 0954 01/26/23 1000 01/26/23 1313 01/27/23 0522 01/27/23 1649 01/28/23 0448  WBC 7.7  --   --  6.6  --  7.2  NEUTROABS 5.2  --   --   --   --   --   HGB 11.9* 13.3 11.6* 10.4* 11.2* 10.3*  HCT 37.8* 39.0 34.0* 31.9* 33.0* 32.0*  MCV 93.6  --   --  90.9  --  91.2  PLT 144*  --   --  107*  --  120*     Coagulation Studies: No results for input(s): "LABPROT", "INR" in the last 72 hours.  Imaging personally reviewed  CT head without contrast 1232/2024: No hemorrhage or CT  evidence of an acute cortical infarct   ASSESSMENT AND PLAN: 72 year old male who presented on 01/26/2023 with right gaze deviation, right-sided jerking and secondary generalization.   Status epilepticus, resolved Alcohol use disorder Hypoglycemia, resolved -Etiology of status epilepticus: Patient already on Keppra at home and missed his morning dose on the day of admission.  Also blood glucose was 60, magnesium was 1.6 and there was history of  alcohol use  Recommendations -Wean Versed to 10 mL/h.  If no seizure recurrence, will gradually wean to stop -Continue phenytoin 100 mg every 8 hours -Continue Keppra 1000 mg twice daily -On phenobarbital taper, may need to do a longer taper depending on how  he tolerates Versed wean -Continue EEG until patient is off sedation and remains seizure-free -Continue seizure precautions -As needed IV Versed for clinical seizure -Discussed plan with ICU team  CRITICAL CARE Performed by: Charlsie Quest   Total critical care time: 32 minutes  Critical care time was exclusive of separately billable procedures and treating other patients.  Critical care was necessary to treat or prevent imminent or life-threatening deterioration.  Critical care was time spent personally by me on the following activities: development of treatment plan with patient and/or surrogate as well as nursing, discussions with consultants, evaluation of patient's response to treatment, examination of patient, obtaining history from patient or surrogate, ordering and performing treatments and interventions, ordering and review of laboratory studies, ordering and review of radiographic studies, pulse oximetry and re-evaluation of patient's condition.        Lindie Spruce Epilepsy [facility] For questions after 5pm please refer to AMION to reach the Neurologist on call

## 2023-01-29 NOTE — Procedures (Signed)
Patient Name: John Spears  MRN: [id number]  Epilepsy Attending: Charlsie Quest  Referring Physician/Provider: Gordy Councilman, MD  Duration: 01/28/2023 1126 to 01/29/2023 1126   Patient history: 72 year old with prior CVA, seizure disorder, LKW 8:30 AM, witnessed seizure activity getting eeg to evaluate for seizure   Level of alertness:  comatose   AEDs during EEG study:  Versed, LEV, PHT, Phenobarb   Technical aspects: This EEG study was done with scalp electrodes positioned according to the 10-20 International system of electrode placement. Electrical activity was reviewed with band pass filter of 1-70Hz , sensitivity of 7 uV/mm, display speed of 44mm/sec with a 60Hz  notched filter applied as appropriate. EEG data were recorded continuously and digitally stored.  Video monitoring was available and reviewed as appropriate.   Description: At the beginning of the study, EEG showed seizures without clinical signs arising from left hemisphere. During seizures, EEG showed low amplitude 13 to 15 Hz beta activity and left hemisphere which gradually evolved into sharply contoured 9 to 10 Hz alpha activity followed by 3-5hz  theta- and delta slowing. 26 seizures were noted lasting about 30-45 seconds each. Patient was loaded with fosphenytoin at 1238 on 01/28/2023. Last seizure was noted on 01/28/2023 at 1250. Subsequently EEG showed  continuous generalized and lateralized left hemisphere 3 to 6 Hz delta slowing.      ABNORMALITY -Focal seizure, left hemisphere -Continuous slow, generalized and lateralized left hemisphere   IMPRESSION: This study initially showed seizures without clinical signs arising from left hemisphere,  26 seizures were noted, lasting about 30-45 seconds each. Patient was loaded with fosphenytoin at 1238 on 01/28/2023. Last seizure was noted on 01/28/2023 at 1250.   EEG also showed cortical dysfunction in left hemisphere likely secondary to underlying structural abnormality,  postictal state. Additionally there was severe diffuse encephalopathy, likely related to sedation.  [name]

## 2023-01-29 NOTE — Progress Notes (Signed)
NAME:  Aundrea Demmons, MRN:  [id number], DOB:  May 06, 1950, LOS: 3 ADMISSION DATE:  01/26/2023, CONSULTATION DATE:  01/26/2023 REFERRING MD:  Andria Meuse, EDP, CHIEF COMPLAINT:  seizure   History of Present Illness:  72 year old male with past medical history of heart failure with recovered EF, MCA stroke, hypertension, alcohol use disorder, seizure disorder who presented to the ED via EMS with seizure, altered mental status. Patient unable to participate in history, therefore HPI obtained from ED, neurology notes. Per chart review, patient was up this AM making his bed when sister, Kathie Rhodes, heard a fall/loud noise around 0830. She found patient lying on the floor and called 911 with concern of seizure activity. Notes he has been compliant with AEDs, unclear on his alcohol use, and eating "okay." Per ED triage notes, EMS noted seizure activity, right tremor, leftward gaze. He was given 5mg  versed which did not break seizure activity, and given subsequent 2.5mg  with abatement. He was intubated immediately upon arrival. He was called a code stroke and taken to CT with no evidence of bleed. He was loaded with keppra and started on propofol/fentanyl and admitted to [facility].   Pertinent  Medical History  heart failure with recovered EF, MCA stroke, hypertension, alcohol use disorder, seizure disorder  Significant Hospital Events: Including procedures, antibiotic start and stop dates in addition to other pertinent events   12/3 - Admitted to ICU after seizure, altered mental status. CT head negative. Seen by neuro. LTM started.  12/4 - Heavily sedated no obvious seizure. Prop was weaned off. Then versed. Had break thru seizure w/ worse exam. Got bolus of versed f/b resuming gtt and also started pheno load and taper 12/5 - On a little higher versed than day prior. Still on LTM. Exam perhaps a little better BUT still having breakthrough seizures, Versed increased. 12/6 - No seizures overnight, Versed  weaned.  Interim History / Subjective:  No significant overnight events LTM EEG without further seizures overnight, Versed weaned to 10 Not waking up/following commands yet, on significant sedation  Objective:  Blood pressure 135/66, pulse (!) 58, temperature 98.7 F (37.1 C), temperature source Axillary, resp. rate 17, height 5\' 11"  (1.803 m), weight 77.8 kg, SpO2 100%.    Vent Mode: PSV;CPAP FiO2 (%):  [30 %] 30 % Set Rate:  [14 bmp] 14 bmp Vt Set:  [550 mL] 550 mL PEEP:  [5 cmH20] 5 cmH20 Pressure Support:  [10 cmH20] 10 cmH20 Plateau Pressure:  [14 cmH20-17 cmH20] 14 cmH20   Intake/Output Summary (Last 24 hours) at 01/29/2023 0915 Last data filed at 01/29/2023 0900 Gross per 24 hour  Intake 2707.28 ml  Output 725 ml  Net 1982.28 ml   Filed Weights   01/26/23 1000 01/27/23 0500 01/28/23 0500  Weight: 82.4 kg 81.1 kg 77.8 kg   Physical Examination: General: Acutely ill-appearing older man in NAD. Intubated, sedated. HEENT: [place]/AT, anicteric sclera, PERRL 2mm sluggish, moist mucous membranes. Neuro: Sedated. Does not respond to verbal, tactile or noxious stimuli. Withdraws to pain weakly in BLE. Not following commands. No spontaneous movement of extremities noted. +Corneal, +Cough, and +Gag  CV: RRR, no m/g/r. PULM: Breathing even and unlabored on vent (PSV 10/5). Lung fields CTAB. GI: Soft, nontender, nondistended. Hypoactive bowel sounds. Extremities: Trace bilateral symmetric LE edema noted. Skin: Warm/dry, no rashes.  Resolved Hospital Problem List:     Assessment & Plan:  Acute metabolic encephalopathy s/p Break thru Seizures w/ remote h/o stroke exacerbated by ETOH and prob ETOH wd seizure and  cont to have subclinical seizures per LTM on 12/5 - Neuro following, appreciate recommendations - LTM EEG - AEDs per Neuro (S/p fosphenytoin; continue Keppra, phenytoin, phenobarbital taper, Versed gtt), additional Versed boluses PRN; weaning continuous sedation as able -  Seizure precautions - Neuroprotective measures: HOB > 30 degrees, normoglycemia, normothermia, electrolytes WNL  Acute respiratory failure due to acute encephalopathy and seizures c/b aspiration PNA  - Continue full vent support (4-8cc/kg IBW) - Wean FiO2 for O2 sat > 90% - Daily WUA/SBT, rate limiting factor is poor mental status while currently precludes extubation - VAP bundle - Pulmonary hygiene - PAD protocol for sedation: Fentanyl and Versed for goal RASS -1 to -2 - Unasyn x 5-day course, end 12/7 - F/u Resp Cx, prelim few GPCs - Follow CXR  Heart failure; most recent echo 06/2022 with EF 60-65%, mild LVH, G1DD  - Hold Coreg for now - Hold diuretics (Lasix), restart as clinically appropriate  AKI, improving Hypophosphatemia, improved - Trend BMP - Replete electrolytes as indicated - Monitor I&Os - Avoid nephrotoxic agents as able - Ensure adequate renal perfusion  Hyperlipidemia and h/o Hypertension  - Continue statin - Hold antihypertensives for now due to borderline BP with sedation  Alcohol use disorder; ethanol negative in ED. Unclear last drink  - Monitor for signs/symptoms of withdrawal - Phenobarbital taper - CIWA with Ativan once extubated, currently on Versed gtt - Continue thiamine, folate, MV  Best Practice (right click and "Reselect all SmartList Selections" daily)   Diet/type: tubefeeds DVT prophylaxis: LMWH Pressure ulcer(s). pressure injury stage n/a was NOT present on admission GI prophylaxis: H2B Lines: N/A Foley:  Yes, and it is still needed Code Status:   full code Last date of multidisciplinary goals of care discussion [attempting to touch base with family]  Critical care time:   The patient is critically ill with multiple organ system failure and requires high complexity decision making for assessment and support, frequent evaluation and titration of therapies, advanced monitoring, review of radiographic studies and interpretation of complex  data.   Critical Care Time devoted to patient care services, exclusive of separately billable procedures, described in this note is 39 minutes.  Tim Lair, PA-C [facility] 01/29/23 9:15 AM  Please see [email or web address] for pager details.  From 7A-7P if no response, please call [phone] After hours, please call ELink [phone]

## 2023-01-30 ENCOUNTER — Inpatient Hospital Stay (HOSPITAL_COMMUNITY): Payer: 59

## 2023-01-30 DIAGNOSIS — J9601 Acute respiratory failure with hypoxia: Secondary | ICD-10-CM | POA: Diagnosis not present

## 2023-01-30 DIAGNOSIS — F10939 Alcohol use, unspecified with withdrawal, unspecified: Secondary | ICD-10-CM

## 2023-01-30 DIAGNOSIS — G40909 Epilepsy, unspecified, not intractable, without status epilepticus: Secondary | ICD-10-CM | POA: Diagnosis not present

## 2023-01-30 DIAGNOSIS — G40901 Epilepsy, unspecified, not intractable, with status epilepticus: Secondary | ICD-10-CM | POA: Diagnosis not present

## 2023-01-30 LAB — PHOSPHORUS: Phosphorus: 2.4 mg/dL — ABNORMAL LOW (ref 2.5–4.6)

## 2023-01-30 LAB — CBC
HCT: 31 % — ABNORMAL LOW (ref 39.0–52.0)
Hemoglobin: 9.9 g/dL — ABNORMAL LOW (ref 13.0–17.0)
MCH: 29.3 pg (ref 26.0–34.0)
MCHC: 31.9 g/dL (ref 30.0–36.0)
MCV: 91.7 fL (ref 80.0–100.0)
Platelets: 105 10*3/uL — ABNORMAL LOW (ref 150–400)
RBC: 3.38 MIL/uL — ABNORMAL LOW (ref 4.22–5.81)
RDW: 14.7 % (ref 11.5–15.5)
WBC: 9.9 10*3/uL (ref 4.0–10.5)
nRBC: 0 % (ref 0.0–0.2)

## 2023-01-30 LAB — GLUCOSE, CAPILLARY
Glucose-Capillary: 106 mg/dL — ABNORMAL HIGH (ref 70–99)
Glucose-Capillary: 122 mg/dL — ABNORMAL HIGH (ref 70–99)
Glucose-Capillary: 109 mg/dL — ABNORMAL HIGH (ref 70–99)
Glucose-Capillary: 134 mg/dL — ABNORMAL HIGH (ref 70–99)
Glucose-Capillary: 109 mg/dL — ABNORMAL HIGH (ref 70–99)
Glucose-Capillary: 128 mg/dL — ABNORMAL HIGH (ref 70–99)

## 2023-01-30 LAB — BASIC METABOLIC PANEL
Anion gap: 10 (ref 5–15)
BUN: 28 mg/dL — ABNORMAL HIGH (ref 8–23)
CO2: 19 mmol/L — ABNORMAL LOW (ref 22–32)
Calcium: 8.5 mg/dL — ABNORMAL LOW (ref 8.9–10.3)
Chloride: 116 mmol/L — ABNORMAL HIGH (ref 98–111)
Creatinine, Ser: 0.91 mg/dL (ref 0.61–1.24)
GFR, Estimated: >60 mL/min (ref >60)
Glucose, Bld: 118 mg/dL — ABNORMAL HIGH (ref 70–99)
Potassium: 4.2 mmol/L (ref 3.5–5.1)
Sodium: 145 mmol/L (ref 135–145)

## 2023-01-30 LAB — PROCALCITONIN: Procalcitonin: 0.15 ng/mL

## 2023-01-30 LAB — MAGNESIUM: Magnesium: 1.9 mg/dL (ref 1.7–2.4)

## 2023-01-30 MED ORDER — MIDAZOLAM BOLUS VIA INFUSION: 4.0000 mg | INTRAVENOUS | Status: DC | PRN

## 2023-01-30 MED ORDER — POTASSIUM & SODIUM PHOSPHATES 280-160-250 MG PO PACK
1.0000 | PACK | Freq: Three times a day (TID) | ORAL | Status: AC
  Administered 2023-01-30 (×2): 1
  Filled 2023-01-30 (×2): qty 1

## 2023-01-30 MED ORDER — ACETAMINOPHEN 160 MG/5ML PO SOLN
650.0000 mg | Freq: Four times a day (QID) | ORAL | Status: DC | PRN
  Administered 2023-01-31 – 2023-02-09 (×3): 650 mg via ORAL
  Filled 2023-01-30 (×3): qty 20.3

## 2023-01-30 MED ORDER — SODIUM CHLORIDE 0.9 % IV SOLN
3.0000 g | Freq: Four times a day (QID) | INTRAVENOUS | Status: AC
  Administered 2023-01-30 – 2023-02-01 (×8): 3 g via INTRAVENOUS
  Filled 2023-01-30 (×8): qty 8

## 2023-01-30 MED ORDER — SODIUM CHLORIDE 3 % IN NEBU
4.0000 mL | INHALATION_SOLUTION | Freq: Two times a day (BID) | RESPIRATORY_TRACT | Status: AC
  Administered 2023-01-30 – 2023-02-01 (×6): 4 mL via RESPIRATORY_TRACT
  Filled 2023-01-30 (×6): qty 4

## 2023-01-30 MED ORDER — MAGNESIUM SULFATE 2 GM/50ML IV SOLN
2.0000 g | Freq: Once | INTRAVENOUS | Status: AC
Start: 2023-01-30 — End: 2023-01-30
  Administered 2023-01-30: 2 g via INTRAVENOUS
  Filled 2023-01-30: qty 50

## 2023-01-30 NOTE — Progress Notes (Signed)
RT note. RT at bedside during morning rounds. Patient ETT found at 24 at the lips, has been previously documented at 27 at the lips. Patient achieving volumes in 500, sat 100%. RN aware, RT will continue to monitor, and will get XR.   01/30/23 0746  Vent Select  Invasive or Noninvasive Invasive  Adult Vent Y  Airway 8 mm  Placement Date/Time: 01/26/23 0955   Placed By: ED Physician  Airway Device: Endotracheal Tube  Laryngoscope Blade: MAC;4  ETT Types: Oral  Size (mm): 8 mm  Cuffed: Cuffed  Insertion attempts: 1  Airway Equipment: Video Laryngoscope  Placement Confirm...  Secured at (cm) (S)  24 cm (patient found at 24at the lip, patient achieving volumes and sating 100%)  Measured From Lips  Secured Location Left  Secured By English as a second language teacher No  Tube Holder Repositioned Yes  Cuff Pressure (cm H2O) Clear OR 27-39 CmH2O  Site Condition Dry  Adult Ventilator Settings  Vent Type Servo i  Humidity HME  Vent Mode PRVC  Vt Set 550 mL  Set Rate 14 bmp  FiO2 (%) 30 %  I Time 0.9 Sec(s)  PEEP 5 cmH20  Adult Ventilator Measurements  Peak Airway Pressure 20 L/min  Mean Airway Pressure 10 cmH20  Resp Rate Spontaneous 5 br/min  Resp Rate Total 19 br/min  Exhaled Vt 511 mL  Measured Ve 10.5 L  I:E Ratio Measured 1:2.4  Auto PEEP 0 cmH20  Total PEEP 5 cmH20  SpO2 100 %  Adult Ventilator Alarms  Alarms On Y  Ve High Alarm 20 L/min  Ve Low Alarm 5 L/min  Resp Rate High Alarm 40 br/min  Resp Rate Low Alarm 10  PEEP Low Alarm 3 cmH2O  Press High Alarm 40 cmH2O  T Apnea 20 sec(s)  Daily Weaning Assessment  Daily Assessment of Readiness to Wean Wean protocol criteria not met  Reason not met Inadequate cough with suctioning  Breath Sounds  Bilateral Breath Sounds Rhonchi;Diminished  Airway Suctioning/Secretions  Suction Type ETT  Suction Device  Catheter  Secretion Amount Small  Secretion Color Tan  Secretion Consistency Thick  Suction Tolerance Tolerated well   Suctioning Adverse Effects None

## 2023-01-30 NOTE — Procedures (Signed)
Patient Name: John Spears  MRN: [id number]  Epilepsy Attending: Charlsie Quest  Referring Physician/Provider: Gordy Councilman, MD  Duration: 01/30/2023 1126 to 01/29/2023 0900   Patient history: 72 year old with prior CVA, seizure disorder, LKW 8:30 AM, witnessed seizure activity getting eeg to evaluate for seizure   Level of alertness:  comatose   AEDs during EEG study:  Versed, LEV, PHT, Phenobarb   Technical aspects: This EEG study was done with scalp electrodes positioned according to the 10-20 International system of electrode placement. Electrical activity was reviewed with band pass filter of 1-70Hz , sensitivity of 7 uV/mm, display speed of 60mm/sec with a 60Hz  notched filter applied as appropriate. EEG data were recorded continuously and digitally stored.  Video monitoring was available and reviewed as appropriate.   Description: EEG showed  continuous generalized 3 to 6 Hz delta slowing with overriding 13-15hz  beta activity.     ABNORMALITY -Continuous slow, generalized   IMPRESSION: This study is suggestive of severe diffuse encephalopathy, likely related to sedation. No seizures or epileptiform discharges were noted.  John Spears

## 2023-01-30 NOTE — Progress Notes (Signed)
eLink Physician-Brief Progress Note Patient Name: John Spears DOB: 1950-08-24 MRN: [id number]   Date of Service  01/30/2023  HPI/Events of Note  Noted to be febrile to 101.3 Pt had been treated for aspiration pneumonia, with last dose of unasyn (5 day course) given earlier today.  WBC 9.9, minimal secretions per ETT CXR from this morning did show progressed LLL consolidation, possible worsening R perihilar opacity.   eICU Interventions  Will obtain cultures, check lactate, procalcitonin,.  Restarted unasyn Will continue to monitor closely.         Kortne All M DELA CRUZ 01/30/2023, 9:53 PM

## 2023-01-30 NOTE — Progress Notes (Signed)
NAME:  John Spears, MRN:  [id number], DOB:  06-26-1950, LOS: 4 ADMISSION DATE:  01/26/2023, CONSULTATION DATE:  01/26/2023 REFERRING MD:  Andria Meuse, EDP, CHIEF COMPLAINT:  seizure   History of Present Illness:  72 year old male with past medical history of heart failure with recovered EF, MCA stroke, hypertension, alcohol use disorder, seizure disorder who presented to the ED via EMS with seizure, altered mental status. Patient unable to participate in history, therefore HPI obtained from ED, neurology notes. Per chart review, patient was up this AM making his bed when sister, Kathie Rhodes, heard a fall/loud noise around 0830. She found patient lying on the floor and called 911 with concern of seizure activity. Notes he has been compliant with AEDs, unclear on his alcohol use, and eating "okay." Per ED triage notes, EMS noted seizure activity, right tremor, leftward gaze. He was given 5mg  versed which did not break seizure activity, and given subsequent 2.5mg  with abatement. He was intubated immediately upon arrival. He was called a code stroke and taken to CT with no evidence of bleed. He was loaded with keppra and started on propofol/fentanyl and admitted to [facility].   Pertinent  Medical History  heart failure with recovered EF, MCA stroke, hypertension, alcohol use disorder, seizure disorder  Significant Hospital Events: Including procedures, antibiotic start and stop dates in addition to other pertinent events   12/3 - Admitted to ICU after seizure, altered mental status. CT head negative. Seen by neuro. LTM started.  12/4 - Heavily sedated no obvious seizure. Prop was weaned off. Then versed. Had break thru seizure w/ worse exam. Got bolus of versed f/b resuming gtt and also started pheno load and taper 12/5 - On a little higher versed than day prior. Still on LTM. Exam perhaps a little better BUT still having breakthrough seizures, Versed increased. 12/6 - No seizures overnight, Versed weaned to  5  Interim History / Subjective:   Critically ill, remains intubated. On 5 mg of Versed last 24 hours Afebrile  Objective:  Blood pressure (!) 149/79, pulse 64, temperature 99.8 F (37.7 C), temperature source Axillary, resp. rate 12, height 5\' 11"  (1.803 m), weight 81.1 kg, SpO2 100%.    Vent Mode: PRVC FiO2 (%):  [30 %] 30 % Set Rate:  [14 bmp] 14 bmp Vt Set:  [550 mL] 550 mL PEEP:  [5 cmH20] 5 cmH20 Pressure Support:  [10 cmH20] 10 cmH20 Plateau Pressure:  [15 cmH20-20 cmH20] 20 cmH20   Intake/Output Summary (Last 24 hours) at 01/30/2023 0924 Last data filed at 01/30/2023 0800 Gross per 24 hour  Intake 1939.04 ml  Output 1000 ml  Net 939.04 ml   Filed Weights   01/28/23 0500 01/29/23 0703 01/30/23 0500  Weight: 77.8 kg 83.4 kg 81.1 kg   Physical Examination: General: Acutely ill-appearing older man in NAD. Intubated, sedated. HEENT: [place]/AT, anicteric sclera, PERRL 2mm sluggish, moist mucous membranes. Neuro: Sedate, RASS -3 on 5 mg of Versed CV: RRR, no m/g/r. PULM: Bilateral ventilated breath sounds, no accessory muscle use GI: Soft, nontender, nondistended. Hypoactive bowel sounds. Extremities: Trace bilateral symmetric LE edema noted. Skin: Warm/dry, no rashes.  Labs showed normal electrolytes, no leukocytosis, mild hypophosphatemia Chest x-ray dependently reviewed shows left lower lobe atelectasis/infiltrate, right perihilar infiltrate  Resolved Hospital Problem List:     Assessment & Plan:  Acute metabolic encephalopathy s/p Break thru Seizures w/ remote h/o stroke exacerbated by ETOH and prob ETOH wd seizure and cont to have subclinical seizures per LTM on 12/5 -  Neuro following, appreciate recommendations - LTM EEG per neuro to continue although having technical problems to connect - AEDs per Neuro (S/p fosphenytoin; continue Keppra, phenytoin, phenobarbital taper, Versed gtt), Versed to be weaned by 1 mg an hour to off - Seizure precautions -  Neuroprotective measures: HOB > 30 degrees, normoglycemia, normothermia, electrolytes WNL  Acute respiratory failure due to acute encephalopathy and seizures c/b aspiration PNA  - Continue full vent support (4-8cc/kg IBW) - Wean FiO2 for O2 sat > 90% - Daily WUA/SBT, rate limiting factor is poor mental status while currently precludes extubation - VAP bundle - PAD protocol for sedation: Fentanyl and Versed for goal RASS -1 to -2 - Unasyn x 5-day course, end 12/7 -Add saline nebs with tracheobronchial toilet   Heart failure; most recent echo 06/2022 with EF 60-65%, mild LVH, G1DD  - Hold Coreg for now - Hold diuretics (Lasix), restart as clinically appropriate  AKI, improving Hypophosphatemia, improved - Replete electrolytes as indicated - Monitor I&Os - Avoid nephrotoxic agents as able   Hyperlipidemia and h/o Hypertension  - Continue statin - Hold antihypertensives for now due to borderline BP with sedation  Alcohol use disorder; ethanol negative in ED. Unclear last drink  - Monitor for signs/symptoms of withdrawal - Phenobarbital taper -Doubt he will need more benzodiazepines - Continue thiamine, folate, MV  Best Practice (right click and "Reselect all SmartList Selections" daily)   Diet/type: tubefeeds DVT prophylaxis: LMWH Pressure ulcer(s). pressure injury stage n/a was NOT present on admission GI prophylaxis: H2B Lines: N/A Foley:  Yes, and it is still needed Code Status:   full code Last date of multidisciplinary goals of care discussion [attempting to touch base with family]  Critical care time: 59m   The patient is critically ill with multiple organ system failure and requires high complexity decision making for assessment and support, frequent evaluation and titration of therapies, advanced monitoring, review of radiographic studies and interpretation of complex data.     Comer Locket Vassie Loll, MD  Pulmonary & Critical Care 01/30/23 9:24 AM  Please see  [email or web address] for pager details.  From 7A-7P if no response, please call [phone] After hours, please call ELink [phone]

## 2023-01-30 NOTE — Progress Notes (Signed)
[facility] ADULT ICU REPLACEMENT PROTOCOL   The patient does apply for the [facility] Adult ICU Electrolyte Replacment Protocol based on the criteria listed below:   1.Exclusion criteria: TCTS, ECMO, Dialysis, and Myasthenia Gravis patients 2. Is GFR >/= 30 ml/min? Yes.    Patient's GFR today is >60 3. Is SCr </= 2? Yes.   Patient's SCr is 0.91 mg/dL 4. Did SCr increase >/= 0.5 in 24 hours? No. 5.Pt's weight >40kg  Yes.   6. Abnormal electrolyte(s): Mg=1.9  7. Electrolytes replaced per protocol 8.  Call MD STAT for K+ </= 2.5, Phos </= 1, or Mag </= 1 Physician:  Delia Chimes, eMD  Faye Ramsay 01/30/2023 4:49 AM

## 2023-01-30 NOTE — Progress Notes (Signed)
NEUROLOGY CONSULT FOLLOW UP NOTE   Date of service: January 30, 2023 Patient Name: John Spears MRN:  [id number] DOB:  Mar 10, 1950  Brief HPI  John Spears is a 72 y.o. male  has a past medical history of Alcohol abuse, Heart failure with improved ejection fraction (HFimpEF) (HCC) (02/2022), Hypertension, Seizure (HCC), and Seizure disorder (HCC). who presented with breakthrough seizures   Interval Hx/subjective   -No concern for clinical seizure activity per bedside nursing -Currently on 5 mg Versed  Vitals   Current vital signs: BP 128/64   Pulse (!) 56   Temp 98.8 F (37.1 C) (Axillary)   Resp 14   Ht 5\' 11"  (1.803 m)   Wt 81.1 kg   SpO2 100%   BMI 24.94 kg/m  Vital signs in last 24 hours: Temp:  [98.4 F (36.9 C)-99.6 F (37.6 C)] 98.8 F (37.1 C) (12/07 0329) Pulse Rate:  [56-77] 56 (12/07 0600) Resp:  [11-30] 14 (12/07 0600) BP: (118-157)/(59-116) 128/64 (12/07 0600) SpO2:  [100 %] 100 % (12/07 0600) FiO2 (%):  [30 %] 30 % (12/07 0357) Weight:  [81.1 kg] 81.1 kg (12/07 0500)   Vitals:   01/30/23 0357 01/30/23 0400 01/30/23 0500 01/30/23 0600  BP:  (!) 150/116 (!) 146/77 128/64  Pulse: 74 77 70 (!) 56  Resp: (!) 25 (!) 26 20 14   Temp:      TempSrc:      SpO2: 100% 100% 100% 100%  Weight:   81.1 kg   Height:         Body mass index is 24.94 kg/m.  Physical Exam   General: lying in bed, intubated   Neurologic Examination   Neuro: On Versed at 5 mL/h, does not open eyes to noxious stimuli but does grimace slightly, on passive eye opening pupils are equally round and reactive to light, corneal reflex slightly stronger on the right than the left, cough reflex intact, grimace to noxious stimulation in the right upper extremity without movement.  Left upper extremity slight withdrawal.  Bilateral lower extremity mild adduction on noxious stimulation distally, minimal movement to noxious simulation proximally   Medications  Current  Facility-Administered Medications:    Ampicillin-Sulbactam (UNASYN) 3 g in sodium chloride 0.9 % 100 mL IVPB, 3 g, Intravenous, Q6H, Simonne Martinet, NP, Paused at 01/30/23 0559   atorvastatin (LIPITOR) tablet 40 mg, 40 mg, Per Tube, Daily, Calton Dach I, RPH, 40 mg at 01/29/23 3244   Chlorhexidine Gluconate Cloth 2 % PADS 6 each, 6 each, Topical, Daily, Oretha Milch, MD, 6 each at 01/29/23 1231   docusate (COLACE) 50 MG/5ML liquid 100 mg, 100 mg, Per Tube, BID PRN, Oretha Milch, MD   enoxaparin (LOVENOX) injection 40 mg, 40 mg, Subcutaneous, Q24H, Autry, Lauren E, PA-C, 40 mg at 01/29/23 1217   famotidine (PEPCID) tablet 20 mg, 20 mg, Per Tube, BID, Cherlynn Polo, Lauren E, PA-C, 20 mg at 01/29/23 2159   feeding supplement (PROSource TF20) liquid 60 mL, 60 mL, Per Tube, BID, Chand, Garnet Sierras, MD, 60 mL at 01/29/23 2159   feeding supplement (VITAL AF 1.2 CAL) liquid 1,000 mL, 1,000 mL, Per Tube, Continuous, Chand, Garnet Sierras, MD, Last Rate: 50 mL/hr at 01/30/23 0600, Infusion Verify at 01/30/23 0600   fentaNYL (SUBLIMAZE) bolus via infusion 25-100 mcg, 25-100 mcg, Intravenous, Q15 min PRN, Cherlynn Polo, Lauren E, PA-C   fentaNYL (SUBLIMAZE) injection 25 mcg, 25 mcg, Intravenous, Once, Autry, Lauren E, PA-C   fentaNYL in NS (53mcg/ml)  infusion-PREMIX, 25-200 mcg/hr, Intravenous, Continuous, Cristopher Peru, PA-C, Stopped at 01/28/23 1127   folic acid (FOLVITE) tablet 1 mg, 1 mg, Per Tube, Daily, Calton Dach I, RPH, 1 mg at 01/29/23 7425   insulin aspart (novoLOG) injection 0-9 Units, 0-9 Units, Subcutaneous, Q4H, Autry, Lauren E, PA-C, 1 Units at 01/29/23 0412   levETIRAcetam (KEPPRA) IVPB 1000 mg/100 mL premix, 1,000 mg, Intravenous, Q12H, Soley Harriss L, MD, Stopped at 01/29/23 2032   midazolam (VERSED) 100 mg/100 mL (1 mg/mL) premix infusion, 5 mg/hr, Intravenous, Continuous, Chand, Garnet Sierras, MD, Last Rate: 5 mL/hr at 01/30/23 0600, 5 mg/hr at 01/30/23 0600   midazolam (VERSED) bolus  via infusion 16 mg, 16 mg, Intravenous, Q5 min PRN, Celine Dishman L, MD, 16 mg at 01/28/23 0947   multivitamin with minerals tablet 1 tablet, 1 tablet, Per Tube, Daily, Calton Dach I, RPH, 1 tablet at 01/29/23 9563   Oral care mouth rinse, 15 mL, Mouth Rinse, Q2H, Oretha Milch, MD, 15 mL at 01/30/23 0604   Oral care mouth rinse, 15 mL, Mouth Rinse, PRN, Oretha Milch, MD   [COMPLETED] PHENobarbital (LUMINAL) tablet 129.6 mg, 129.6 mg, Per Tube, Q8H, 129.6 mg at 01/29/23 1611 **FOLLOWED BY** PHENobarbital (LUMINAL) tablet 64.8 mg, 64.8 mg, Per Tube, Q8H, Kayvion Arneson L, MD, 64.8 mg at 01/30/23 0009   phenytoin (DILANTIN) injection 100 mg, 100 mg, Intravenous, Q8H, Dang, Thuy D, RPH, 100 mg at 01/30/23 0604   polyethylene glycol (MIRALAX / GLYCOLAX) packet 17 g, 17 g, Per Tube, Daily PRN, Cherlynn Polo, Lauren E, PA-C   polyethylene glycol (MIRALAX / GLYCOLAX) packet 17 g, 17 g, Per Tube, Daily, Cheri Fowler, MD, 17 g at 01/29/23 0925   [COMPLETED] thiamine (VITAMIN B1) 500 mg in sodium chloride 0.9 % 50 mL IVPB, 500 mg, Intravenous, Q8H, Stopped at 01/29/23 0734 **FOLLOWED BY** thiamine (VITAMIN B1) 250 mg in sodium chloride 0.9 % 50 mL IVPB, 250 mg, Intravenous, Daily, Last Rate: 105 mL/hr at 01/29/23 1315, 250 mg at 01/29/23 1315 **FOLLOWED BY** [START ON 02/04/2023] thiamine (VITAMIN B1) injection 100 mg, 100 mg, Intravenous, Daily, Jaelan Rasheed L, MD [facility]   CBC:  Recent Labs  Lab 01/26/23 0954 01/26/23 1000 01/29/23 1032 01/30/23 0353  WBC 7.7   < > 9.7 9.9  NEUTROABS 5.2  --   --   --   HGB 11.9*   < > 9.6* 9.9*  HCT 37.8*   < > 30.1* 31.0*  MCV 93.6   < > 92.0 91.7  PLT 144*   < > 93* 105*   < > = values in this interval not displayed.    Basic Metabolic Panel:  Lab Results  Component Value Date   NA 145 01/30/2023   K 4.2 01/30/2023   CO2 19 (L) 01/30/2023   GLUCOSE 118 (H) 01/30/2023   BUN 28 (H) 01/30/2023   CREATININE 0.91 01/30/2023    CALCIUM 8.5 (L) 01/30/2023   GFRNONAA >60 01/30/2023   GFRAA >60 04/04/2019   Lipid Panel:  Lab Results  Component Value Date   LDLCALC 133 (H) 03/08/2019   HgbA1c:  Lab Results  Component Value Date   HGBA1C 5.3 01/26/2023   Urine Drug Screen:     Component Value Date/Time   LABOPIA NONE DETECTED 01/27/2023 1800   COCAINSCRNUR NONE DETECTED 01/27/2023 1800   COCAINSCRNUR NONE DETECTED 02/20/2022 1652   LABBENZ POSITIVE (A) 01/27/2023 1800   AMPHETMU NONE DETECTED 01/27/2023 1800   THCU NONE DETECTED  01/27/2023 1800   LABBARB POSITIVE (A) 01/27/2023 1800    Alcohol Level     Component Value Date/Time   ETH <10 01/26/2023 0954   INR  Lab Results  Component Value Date   INR 1.1 01/26/2023   APTT  Lab Results  Component Value Date   APTT 24 01/26/2023   AED levels:  Lab Results  Component Value Date   PHENYTOIN 13.9 01/28/2023    CT Head without contrast(Personally reviewed): 01/26/2023: No hemorrhage or CT evidence of an acute cortical infarct   cEEG:  01/28/2023 1126 to 01/29/2023 0630  This study initially showed seizures without clinical signs arising from left hemisphere,  26 seizures were noted, lasting about 30-45 seconds each. Patient was loaded with fosphenytoin at 1238 on 01/28/2023. Last seizure was noted on 01/28/2023 at 1250.  EEG also showed cortical dysfunction in left hemisphere likely secondary to underlying structural abnormality, postictal state. Additionally there was severe diffuse encephalopathy, likely related to sedation.  12/6 - 12/7 report pending, prelim negative for seizures  Assessment   72 year old male who presented on 01/26/2023 with right gaze deviation, right-sided jerking and secondary generalization.   Status epilepticus, resolved Alcohol use disorder Hypoglycemia, resolved  Etiology of status epilepticus: Patient already on Keppra at home and missed his morning dose on the day of admission.  Also blood glucose was 60,  magnesium was 1.6 and there was history of  alcohol use with undetectable alcohol level on admission concerning for alcohol withdrawal  Recommendations  -Wean Versed by 1/h every hour -Continue phenytoin 100 mg every 8 hours -Keppra 1000 mg BID, may increase to 1500 mg BID if needed given improving renal function  Estimated Creatinine Clearance: 80.4 mL/min (by C-G formula based on SCr of 0.91 mg/dL).   CrCl 80 to 130 mL/minute/1.73 m2: 500 mg to 1.5 g every 12 hours. -On phenobarbital taper, may need to do a longer taper depending on how he tolerates Versed wean -Continue EEG until patient is off sedation and remains seizure-free -Continue seizure precautions -As needed IV Versed for clinical seizure 4 mg (neurology may order 16 mg if concern for recurrent status arises) -Discussed plan with ICU team  CRITICAL CARE Performed by:  Brooke Dare MD-PhD [facility] [phone] Available 7 AM to 7 PM, outside these hours please contact Neurologist on call listed on AMION   Total critical care time: 30 minutes  Critical care time was exclusive of separately billable procedures and treating other patients.  Critical care was necessary to treat or prevent imminent or life-threatening deterioration.  Critical care was time spent personally by me on the following activities: development of treatment plan with patient and/or surrogate as well as nursing, discussions with consultants, evaluation of patient's response to treatment, examination of patient, obtaining history from patient or surrogate, ordering and performing treatments and interventions, ordering and review of laboratory studies, ordering and review of radiographic studies, pulse oximetry and re-evaluation of patient's condition.

## 2023-01-30 NOTE — Progress Notes (Signed)
MB performed maintenance on FP1, F7, and T8 electrodes. All impedances are below 10k ohms. No skin breakdown noted. Re-started EEG scroll from default to DB montage.

## 2023-01-31 ENCOUNTER — Inpatient Hospital Stay (HOSPITAL_COMMUNITY): Payer: 59

## 2023-01-31 DIAGNOSIS — F10939 Alcohol use, unspecified with withdrawal, unspecified: Secondary | ICD-10-CM | POA: Diagnosis not present

## 2023-01-31 DIAGNOSIS — G40901 Epilepsy, unspecified, not intractable, with status epilepticus: Secondary | ICD-10-CM | POA: Diagnosis not present

## 2023-01-31 DIAGNOSIS — F10931 Alcohol use, unspecified with withdrawal delirium: Secondary | ICD-10-CM | POA: Diagnosis not present

## 2023-01-31 DIAGNOSIS — G40909 Epilepsy, unspecified, not intractable, without status epilepticus: Secondary | ICD-10-CM | POA: Diagnosis not present

## 2023-01-31 DIAGNOSIS — J9601 Acute respiratory failure with hypoxia: Secondary | ICD-10-CM | POA: Diagnosis not present

## 2023-01-31 LAB — BASIC METABOLIC PANEL
Anion gap: 10 (ref 5–15)
BUN: 31 mg/dL — ABNORMAL HIGH (ref 8–23)
CO2: 22 mmol/L (ref 22–32)
Calcium: 8.6 mg/dL — ABNORMAL LOW (ref 8.9–10.3)
Chloride: 115 mmol/L — ABNORMAL HIGH (ref 98–111)
Creatinine, Ser: 1.09 mg/dL (ref 0.61–1.24)
GFR, Estimated: >60 mL/min (ref >60)
Glucose, Bld: 133 mg/dL — ABNORMAL HIGH (ref 70–99)
Potassium: 3.7 mmol/L (ref 3.5–5.1)
Sodium: 147 mmol/L — ABNORMAL HIGH (ref 135–145)

## 2023-01-31 LAB — GLUCOSE, CAPILLARY
Glucose-Capillary: 124 mg/dL — ABNORMAL HIGH (ref 70–99)
Glucose-Capillary: 102 mg/dL — ABNORMAL HIGH (ref 70–99)
Glucose-Capillary: 127 mg/dL — ABNORMAL HIGH (ref 70–99)
Glucose-Capillary: 140 mg/dL — ABNORMAL HIGH (ref 70–99)
Glucose-Capillary: 73 mg/dL (ref 70–99)

## 2023-01-31 LAB — CBC WITH DIFFERENTIAL/PLATELET
Abs Immature Granulocytes: 0.05 10*3/uL (ref 0.00–0.07)
Basophils Absolute: 0.1 10*3/uL (ref 0.0–0.1)
Basophils Relative: 0 %
Eosinophils Absolute: 0.2 10*3/uL (ref 0.0–0.5)
Eosinophils Relative: 2 %
HCT: 30.9 % — ABNORMAL LOW (ref 39.0–52.0)
Hemoglobin: 9.7 g/dL — ABNORMAL LOW (ref 13.0–17.0)
Immature Granulocytes: 0 %
Lymphocytes Relative: 16 %
Lymphs Abs: 1.9 10*3/uL (ref 0.7–4.0)
MCH: 29.1 pg (ref 26.0–34.0)
MCHC: 31.4 g/dL (ref 30.0–36.0)
MCV: 92.8 fL (ref 80.0–100.0)
Monocytes Absolute: 2 10*3/uL — ABNORMAL HIGH (ref 0.1–1.0)
Monocytes Relative: 17 %
Neutro Abs: 7.7 10*3/uL (ref 1.7–7.7)
Neutrophils Relative %: 65 %
Platelets: 110 10*3/uL — ABNORMAL LOW (ref 150–400)
RBC: 3.33 MIL/uL — ABNORMAL LOW (ref 4.22–5.81)
RDW: 14.7 % (ref 11.5–15.5)
WBC: 11.9 10*3/uL — ABNORMAL HIGH (ref 4.0–10.5)
nRBC: 0 % (ref 0.0–0.2)

## 2023-01-31 LAB — PHOSPHORUS: Phosphorus: 3.2 mg/dL (ref 2.5–4.6)

## 2023-01-31 LAB — MAGNESIUM: Magnesium: 2.3 mg/dL (ref 1.7–2.4)

## 2023-01-31 MED ORDER — FREE WATER
200.0000 mL | Status: DC
Start: 2023-01-31 — End: 2023-02-03
  Administered 2023-01-31 – 2023-02-03 (×21): 200 mL

## 2023-01-31 MED ORDER — MIDAZOLAM HCL 2 MG/2ML IJ SOLN: 2.0000 mg | INTRAMUSCULAR | Status: DC | PRN

## 2023-01-31 MED ORDER — DEXMEDETOMIDINE HCL IN NACL 400 MCG/100ML IV SOLN
0.0000 ug/kg/h | INTRAVENOUS | Status: DC
  Administered 2023-01-31: 0.7 ug/kg/h via INTRAVENOUS
  Administered 2023-01-31: 0.4 ug/kg/h via INTRAVENOUS
  Administered 2023-02-01: 0.9 ug/kg/h via INTRAVENOUS
  Administered 2023-02-01: 0.7 ug/kg/h via INTRAVENOUS
  Administered 2023-02-01: 0.5 ug/kg/h via INTRAVENOUS
  Administered 2023-02-02: 0.9 ug/kg/h via INTRAVENOUS
  Administered 2023-02-02 (×2): 0.8 ug/kg/h via INTRAVENOUS
  Administered 2023-02-03: 0.7 ug/kg/h via INTRAVENOUS
  Filled 2023-01-31 (×8): qty 100

## 2023-01-31 NOTE — Progress Notes (Signed)
eLink Physician-Brief Progress Note Patient Name: John Spears DOB: 1950-08-24 MRN: [id number]   Date of Service  01/31/2023  HPI/Events of Note  Received request from RN for PRN medication for agitation.  The patient remains intubated and only on precedex gtt.   Rn also request for flexiseal.   eICU Interventions  Versed IV PRN ordered.  Flexiseal ordered.     Intervention Category Intermediate Interventions: Other:  [name] 01/31/2023, 10:23 PM

## 2023-01-31 NOTE — Progress Notes (Signed)
LTM maint complete - no skin breakdown under:  A2, A1, Fz

## 2023-01-31 NOTE — Progress Notes (Signed)
NEUROLOGY CONSULT FOLLOW UP NOTE   Date of service: January 31, 2023 Patient Name: John Spears MRN:  [id number] DOB:  May 11, 1950  Brief HPI  John Spears is a 72 y.o. male  has a past medical history of Alcohol abuse, Heart failure with improved ejection fraction (HFimpEF) (HCC) (02/2022), Hypertension, Seizure (HCC), and Seizure disorder (HCC). who presented with breakthrough seizures   Interval Hx/subjective   -Worsening fever, worsening pneumonia suspected  -No concern for clinical seizure activity per bedside nursing -Sedation weaned  -Improving mentation, intermittently agitated   Vitals   Current vital signs: BP (!) 156/72   Pulse 70   Temp 98.8 F (37.1 C) (Axillary)   Resp 17   Ht 5\' 11"  (1.803 m)   Wt 81.1 kg   SpO2 98%   BMI 24.94 kg/m  Vital signs in last 24 hours: Temp:  [98.8 F (37.1 C)-101.3 F (38.5 C)] 98.8 F (37.1 C) (12/08 0341) Pulse Rate:  [55-84] 70 (12/08 0744) Resp:  [13-28] 17 (12/08 0744) BP: (117-183)/(54-131) 156/72 (12/08 0600) SpO2:  [98 %-100 %] 98 % (12/08 0744) FiO2 (%):  [30 %] 30 % (12/08 0744)   Vitals:   01/31/23 0400 01/31/23 0500 01/31/23 0600 01/31/23 0744  BP: (!) 164/61 (!) 173/59 (!) 156/72   Pulse: (!) 58 (!) 59 63 70  Resp: 14 14 13 17   Temp:      TempSrc:      SpO2: 99% 99% 100% 98%  Weight:      Height:         Body mass index is 24.94 kg/m.  Physical Exam   General: lying in bed, intubated   Neurologic Examination    Neuro:Off sedation, opens eyes to voice, wiggles toes to commands, does not follow commands in upper extremity, blinks to midline threat, corneal reflex intact bilaterally, cough reflex intact, grimace to noxious stimulation in the right upper extremity without movement. Right side weaker than the left.    Medications  Current Facility-Administered Medications:    acetaminophen (TYLENOL) 160 MG/5ML solution 650 mg, 650 mg, Oral, Q6H PRN, Gaetana Michaelis, MD, 650 mg at  01/31/23 0023   Ampicillin-Sulbactam (UNASYN) 3 g in sodium chloride 0.9 % 100 mL IVPB, 3 g, Intravenous, Q6H, Gaetana Michaelis, MD, Last Rate: 200 mL/hr at 01/31/23 0600, Infusion Verify at 01/31/23 0600   atorvastatin (LIPITOR) tablet 40 mg, 40 mg, Per Tube, Daily, Calton Dach I, RPH, 40 mg at 01/30/23 7829   Chlorhexidine Gluconate Cloth 2 % PADS 6 each, 6 each, Topical, Daily, Oretha Milch, MD, 6 each at 01/31/23 0250   docusate (COLACE) 50 MG/5ML liquid 100 mg, 100 mg, Per Tube, BID PRN, Oretha Milch, MD, 100 mg at 01/30/23 1644   enoxaparin (LOVENOX) injection 40 mg, 40 mg, Subcutaneous, Q24H, Autry, Lauren E, PA-C, 40 mg at 01/30/23 1143   famotidine (PEPCID) tablet 20 mg, 20 mg, Per Tube, BID, Cherlynn Polo, Lauren E, PA-C, 20 mg at 01/30/23 2208   feeding supplement (PROSource TF20) liquid 60 mL, 60 mL, Per Tube, BID, Chand, Garnet Sierras, MD, 60 mL at 01/30/23 2208   feeding supplement (VITAL AF 1.2 CAL) liquid 1,000 mL, 1,000 mL, Per Tube, Continuous, Chand, Garnet Sierras, MD, Last Rate: 50 mL/hr at 01/31/23 0600, Infusion Verify at 01/31/23 0600   fentaNYL (SUBLIMAZE) bolus via infusion 25-100 mcg, 25-100 mcg, Intravenous, Q15 min PRN, Cherlynn Polo, Lauren E, PA-C   fentaNYL (SUBLIMAZE) injection 25 mcg, 25 mcg, Intravenous, Once, Cherlynn Polo,  Lauren E, PA-C   fentaNYL in NS (64mcg/ml) infusion-PREMIX, 25-200 mcg/hr, Intravenous, Continuous, Cristopher Peru, PA-C, Stopped at 01/28/23 1127   folic acid (FOLVITE) tablet 1 mg, 1 mg, Per Tube, Daily, Calton Dach I, RPH, 1 mg at 01/30/23 0929   levETIRAcetam (KEPPRA) IVPB 1000 mg/100 mL premix, 1,000 mg, Intravenous, Q12H, Valleri Hendricksen L, MD, Last Rate: 400 mL/hr at 01/31/23 0741, 1,000 mg at 01/31/23 0741   midazolam (VERSED) 100 mg/100 mL (1 mg/mL) premix infusion, 0-5 mg/hr, Intravenous, Continuous, Oretha Milch, MD, Stopped at 01/30/23 1519   midazolam (VERSED) bolus via infusion 4 mg, 4 mg, Intravenous, Q5 min PRN, Derenda Giddings L,  MD   multivitamin with minerals tablet 1 tablet, 1 tablet, Per Tube, Daily, Calton Dach I, RPH, 1 tablet at 01/30/23 5409   Oral care mouth rinse, 15 mL, Mouth Rinse, Q2H, Oretha Milch, MD, 15 mL at 01/31/23 0741   Oral care mouth rinse, 15 mL, Mouth Rinse, PRN, Oretha Milch, MD   [COMPLETED] PHENobarbital (LUMINAL) tablet 129.6 mg, 129.6 mg, Per Tube, Q8H, 129.6 mg at 01/29/23 1611 **FOLLOWED BY** PHENobarbital (LUMINAL) tablet 64.8 mg, 64.8 mg, Per Tube, Q8H, Fynley Chrystal L, MD, 64.8 mg at 01/31/23 0022   phenytoin (DILANTIN) injection 100 mg, 100 mg, Intravenous, Q8H, Dang, Thuy D, RPH, 100 mg at 01/31/23 8119   polyethylene glycol (MIRALAX / GLYCOLAX) packet 17 g, 17 g, Per Tube, Daily PRN, Cherlynn Polo, Lauren E, PA-C   polyethylene glycol (MIRALAX / GLYCOLAX) packet 17 g, 17 g, Per Tube, Daily, Chand, Sudham, MD, 17 g at 01/30/23 1640   sodium chloride HYPERTONIC 3 % nebulizer solution 4 mL, 4 mL, Nebulization, BID, Cyril Mourning V, MD, 4 mL at 01/31/23 0744   [COMPLETED] thiamine (VITAMIN B1) 500 mg in sodium chloride 0.9 % 50 mL IVPB, 500 mg, Intravenous, Q8H, Stopped at 01/29/23 0734 **FOLLOWED BY** thiamine (VITAMIN B1) 250 mg in sodium chloride 0.9 % 50 mL IVPB, 250 mg, Intravenous, Daily, Last Rate: 105 mL/hr at 01/30/23 1143, 250 mg at 01/30/23 1143 **FOLLOWED BY** [START ON 02/04/2023] thiamine (VITAMIN B1) injection 100 mg, 100 mg, Intravenous, Daily, Laneah Luft L, MD [facility]   CBC:  Recent Labs  Lab 01/26/23 0954 01/26/23 1000 01/30/23 0353 01/31/23 0520  WBC 7.7   < > 9.9 11.9*  NEUTROABS 5.2  --   --  7.7  HGB 11.9*   < > 9.9* 9.7*  HCT 37.8*   < > 31.0* 30.9*  MCV 93.6   < > 91.7 92.8  PLT 144*   < > 105* 110*   < > = values in this interval not displayed.    Basic Metabolic Panel:  Lab Results  Component Value Date   NA 147 (H) 01/31/2023   K 3.7 01/31/2023   CO2 22 01/31/2023   GLUCOSE 133 (H) 01/31/2023   BUN 31 (H) 01/31/2023    CREATININE 1.09 01/31/2023   CALCIUM 8.6 (L) 01/31/2023   GFRNONAA >60 01/31/2023   GFRAA >60 04/04/2019   Lipid Panel:  Lab Results  Component Value Date   LDLCALC 133 (H) 03/08/2019   HgbA1c:  Lab Results  Component Value Date   HGBA1C 5.3 01/26/2023   Urine Drug Screen:     Component Value Date/Time   LABOPIA NONE DETECTED 01/27/2023 1800   COCAINSCRNUR NONE DETECTED 01/27/2023 1800   COCAINSCRNUR NONE DETECTED 02/20/2022 1652   LABBENZ POSITIVE (A) 01/27/2023 1800   AMPHETMU NONE  DETECTED 01/27/2023 1800   THCU NONE DETECTED 01/27/2023 1800   LABBARB POSITIVE (A) 01/27/2023 1800    Alcohol Level     Component Value Date/Time   ETH <10 01/26/2023 0954   INR  Lab Results  Component Value Date   INR 1.1 01/26/2023   APTT  Lab Results  Component Value Date   APTT 24 01/26/2023   AED levels:  Lab Results  Component Value Date   PHENYTOIN 13.9 01/28/2023    CT Head without contrast(Personally reviewed): 01/26/2023: No hemorrhage or CT evidence of an acute cortical infarct  Encephalomalacia in the left temporal and occipital lobes is unchanged consistent with prior infarct  cEEG:  01/28/2023 1126 to 01/29/2023 0630  This study initially showed seizures without clinical signs arising from left hemisphere,  26 seizures were noted, lasting about 30-45 seconds each. Patient was loaded with fosphenytoin at 1238 on 01/28/2023. Last seizure was noted on 01/28/2023 at 1250.  EEG also showed cortical dysfunction in left hemisphere likely secondary to underlying structural abnormality, postictal state. Additionally there was severe diffuse encephalopathy, likely related to sedation.  12/6 - 12/7 report pending, prelim negative for seizures  Assessment   71 year old male who presented on 01/26/2023 with right gaze deviation, right-sided jerking and secondary generalization.   Exam today c/w post-ictal Todd's, expect aphasia to continue to gradually resolve and right  hemiparesis to slowly return to baseline  Status epilepticus, resolved Alcohol use disorder Hypoglycemia, resolved Pneumonia, worsening  Etiology of status epilepticus: Patient already on Keppra at home and missed his morning dose on the day of admission.  Also blood glucose was 60, magnesium was 1.6 and there was history of  alcohol use with undetectable alcohol level on admission concerning for alcohol withdrawal  Recommendations  -Continue phenytoin 100 mg every 8 hours at this time -Continue Keppra 1000 mg BID, max for current renal function  Estimated Creatinine Clearance: 67.2 mL/min (by C-G formula based on SCr of 1.09 mg/dL).  -Phenobarb as needed for alcohol withdrawal management, okay to taper/stop from seizure perspective  -Continue EEG for one more day due to c/f worsening infection and stopping phenobarb today -Continue seizure precautions -As needed IV Versed for clinical seizure 4 mg (neurology may order 16 mg if concern for recurrent status arises) -Discussed plan with ICU team in person -Dr. Melynda Ripple will follow tomorrow  CRITICAL CARE Performed by:  Brooke Dare MD-PhD [facility] [phone] Available 7 AM to 7 PM, outside these hours please contact Neurologist on call listed on AMION   Total critical care time: 30 minutes  Critical care time was exclusive of separately billable procedures and treating other patients.  Critical care was necessary to treat or prevent imminent or life-threatening deterioration.  Critical care was time spent personally by me on the following activities: development of treatment plan with patient and/or surrogate as well as nursing, discussions with consultants, evaluation of patient's response to treatment, examination of patient, obtaining history from patient or surrogate, ordering and performing treatments and interventions, ordering and review of laboratory studies, ordering and review of radiographic studies, pulse  oximetry and re-evaluation of patient's condition.

## 2023-01-31 NOTE — Progress Notes (Signed)
NAME:  John Spears, MRN:  [id number], DOB:  1951/01/28, LOS: 5 ADMISSION DATE:  01/26/2023, CONSULTATION DATE:  01/26/2023 REFERRING MD:  Andria Meuse, EDP, CHIEF COMPLAINT:  seizure   History of Present Illness:  72 year old male with past medical history of heart failure with recovered EF, MCA stroke, hypertension, alcohol use disorder, seizure disorder who presented to the ED via EMS with seizure, altered mental status. Patient unable to participate in history, therefore HPI obtained from ED, neurology notes. Per chart review, patient was up this AM making his bed when sister, Kathie Rhodes, heard a fall/loud noise around 0830. She found patient lying on the floor and called 911 with concern of seizure activity. Notes he has been compliant with AEDs, unclear on his alcohol use, and eating "okay." Per ED triage notes, EMS noted seizure activity, right tremor, leftward gaze. He was given 5mg  versed which did not break seizure activity, and given subsequent 2.5mg  with abatement. He was intubated immediately upon arrival. He was called a code stroke and taken to CT with no evidence of bleed. He was loaded with keppra and started on propofol/fentanyl and admitted to [facility].   Pertinent  Medical History  heart failure with recovered EF, MCA stroke, hypertension, alcohol use disorder, seizure disorder  Significant Hospital Events: Including procedures, antibiotic start and stop dates in addition to other pertinent events   12/3 - Admitted to ICU after seizure, altered mental status. CT head negative. Seen by neuro. LTM started.  12/4 - Heavily sedated no obvious seizure. Prop was weaned off. Then versed. Had break thru seizure w/ worse exam. Got bolus of versed f/b resuming gtt and also started pheno load and taper 12/5 - On a little higher versed than day prior. Still on LTM. Exam perhaps a little better BUT still having breakthrough seizures, Versed increased. 12/6 - No seizures overnight, Versed weaned to  5  Interim History / Subjective:   Critically ill, remains intubated. Febrile 101 yesterday, defervesced this morning Low urine output. Versed weaned to off , RN reports intermittent agitation, punching at nurses  Objective:  Blood pressure (!) 158/77, pulse 67, temperature 99.1 F (37.3 C), temperature source Axillary, resp. rate 17, height 5\' 11"  (1.803 m), weight 81.1 kg, SpO2 95%.    Vent Mode: PSV;CPAP FiO2 (%):  [30 %] 30 % Set Rate:  [14 bmp] 14 bmp Vt Set:  [550 mL-600 mL] 600 mL PEEP:  [5 cmH20] 5 cmH20 Pressure Support:  [8 cmH20] 8 cmH20 Plateau Pressure:  [14 cmH20-18 cmH20] 15 cmH20   Intake/Output Summary (Last 24 hours) at 01/31/2023 0901 Last data filed at 01/31/2023 0600 Gross per 24 hour  Intake 1583.51 ml  Output 850 ml  Net 733.51 ml   Filed Weights   01/28/23 0500 01/29/23 0703 01/30/23 0500  Weight: 77.8 kg 83.4 kg 81.1 kg   Physical Examination: General: Acutely ill-appearing older man in NAD. Intubated, off sedation HEENT: [place]/AT, anicteric sclera, PERRL 2mm sluggish, moist mucous membranes. Neuro: Eyes open, does not follow commands, tracks CV: RRR, no m/g/r. PULM: No secretions, no accessory muscle use, bilateral ventilated breath sounds GI: Soft, nontender, nondistended. Hypoactive bowel sounds. Extremities: Trace bilateral symmetric LE edema noted. Skin: Warm/dry, no rashes.  Labs show mild hypernatremia, mild hypokalemia, mild leukocytosis Chest x-ray dependently reviewed shows left lower lobe atelectasis/infiltrate, right perihilar infiltrate  Resolved Hospital Problem List:     Assessment & Plan:  Acute metabolic encephalopathy s/p Break thru Seizures w/ remote h/o stroke exacerbated by ETOH and  prob ETOH wd seizure and cont to have subclinical seizures per LTM on 12/5 - Neuro following, appreciate recommendations - LTM EEG per neuro >> - AEDs per Neuro (S/p fosphenytoin; continue Keppra, phenytoin, ) , Versed gtt has been weaned to  off.  - Seizure precautions - Neuroprotective measures: HOB > 30 degrees, normoglycemia, normothermia, electrolytes WNL  Acute respiratory failure due to acute encephalopathy and seizures c/b aspiration PNA  -Tolerating spontaneous breathing trial, poor mental status precludes extubation - VAP bundle - Unasyn x 5-day course, end 12/7 -Add saline nebs with tracheobronchial toilet   Heart failure; most recent echo 06/2022 with EF 60-65%, mild LVH, G1DD  -Resume low-dose Coreg and titrate - Hold diuretics (Lasix), restart as clinically appropriate  AKI, resolved Hypophosphatemia, improved Hypernatremia -add free water   Hyperlipidemia and h/o Hypertension  - Continue statin - Hold antihypertensives for now due to borderline BP with sedation  Alcohol use disorder; ethanol negative in ED. Unclear last drink  - Monitor for signs/symptoms of withdrawal -Can DC phenobarb taper in my opinion, prefer to use Precedex with goal RASS 0 to -1 to facilitate extubation -Doubt he will need more benzodiazepines - Continue thiamine, folate, MV  Best Practice (right click and "Reselect all SmartList Selections" daily)   Diet/type: tubefeeds DVT prophylaxis: LMWH Pressure ulcer(s). pressure injury stage n/a was NOT present on admission GI prophylaxis: H2B Lines: N/A Foley:  Yes, and it is still needed Code Status:   full code Last date of multidisciplinary goals of care discussion -unable to reach family  Critical care time: 28 m   The patient is critically ill with multiple organ system failure and requires high complexity decision making for assessment and support, frequent evaluation and titration of therapies, advanced monitoring, review of radiographic studies and interpretation of complex data.     Comer Locket Vassie Loll, MD [facility] 01/31/23 9:01 AM  Please see [email or web address] for pager details.  From 7A-7P if no response, please call [phone] After hours,  please call ELink [phone]

## 2023-01-31 NOTE — Procedures (Signed)
Patient Name: Basir Leahey  MRN: [id number]  Epilepsy Attending: Charlsie Quest  Referring Physician/Provider: Gordy Councilman, MD  Duration: 01/30/2023 1126 to 01/31/2023 1126   Patient history: 72 year old with prior CVA, seizure disorder, LKW 8:30 AM, witnessed seizure activity getting eeg to evaluate for seizure   Level of alertness:  awake, asleep   AEDs during EEG study:  LEV, PHT, Phenobarb   Technical aspects: This EEG study was done with scalp electrodes positioned according to the 10-20 International system of electrode placement. Electrical activity was reviewed with band pass filter of 1-70Hz , sensitivity of 7 uV/mm, display speed of 78mm/sec with a 60Hz  notched filter applied as appropriate. EEG data were recorded continuously and digitally stored.  Video monitoring was available and reviewed as appropriate.   Description: No clear posterior dominant rhythm was seen. Sleep was characterized by sleep spindles (12-14Hz ), maximal fronto-central region. EEG showed  continuous generalized and lateralized left hemisphere 3 to 6 Hz delta slowing with overriding 13-15hz  beta activity. Intermittently, lateralized periodic discharges were noted in left hemisphere at 1hz .      ABNORMALITY - Lateralized periodic discharges, left hemisphere  (LPD) - Continuous slow, generalized and lateralized left hemisphere   IMPRESSION: This study showed evidence of epileptogenicity arising from  left hemisphere. Additionally there is cortical dysfunction in left hemisphere likely secondary to underlying structural abnormality/post-ictal state.  Lastly there is moderate diffuse encephalopathy, likely related to sedation. No seizures were noted.   Jeremaine Maraj Annabelle Harman

## 2023-02-01 DIAGNOSIS — G9341 Metabolic encephalopathy: Secondary | ICD-10-CM | POA: Diagnosis not present

## 2023-02-01 DIAGNOSIS — G40909 Epilepsy, unspecified, not intractable, without status epilepticus: Secondary | ICD-10-CM | POA: Diagnosis not present

## 2023-02-01 DIAGNOSIS — G40901 Epilepsy, unspecified, not intractable, with status epilepticus: Secondary | ICD-10-CM | POA: Diagnosis not present

## 2023-02-01 DIAGNOSIS — J9601 Acute respiratory failure with hypoxia: Secondary | ICD-10-CM | POA: Diagnosis not present

## 2023-02-01 LAB — CBC WITH DIFFERENTIAL/PLATELET
Abs Immature Granulocytes: 0.03 10*3/uL (ref 0.00–0.07)
Basophils Absolute: 0 10*3/uL (ref 0.0–0.1)
Basophils Relative: 0 %
Eosinophils Absolute: 0.5 10*3/uL (ref 0.0–0.5)
Eosinophils Relative: 5 %
HCT: 30.3 % — ABNORMAL LOW (ref 39.0–52.0)
Hemoglobin: 9.5 g/dL — ABNORMAL LOW (ref 13.0–17.0)
Immature Granulocytes: 0 %
Lymphocytes Relative: 19 %
Lymphs Abs: 1.7 10*3/uL (ref 0.7–4.0)
MCH: 29.1 pg (ref 26.0–34.0)
MCHC: 31.4 g/dL (ref 30.0–36.0)
MCV: 92.9 fL (ref 80.0–100.0)
Monocytes Absolute: 1.2 10*3/uL — ABNORMAL HIGH (ref 0.1–1.0)
Monocytes Relative: 14 %
Neutro Abs: 5.7 10*3/uL (ref 1.7–7.7)
Neutrophils Relative %: 62 %
Platelets: 132 10*3/uL — ABNORMAL LOW (ref 150–400)
RBC: 3.26 MIL/uL — ABNORMAL LOW (ref 4.22–5.81)
RDW: 14.5 % (ref 11.5–15.5)
WBC: 9.2 10*3/uL (ref 4.0–10.5)
nRBC: 0 % (ref 0.0–0.2)

## 2023-02-01 LAB — BASIC METABOLIC PANEL
Anion gap: 8 (ref 5–15)
BUN: 30 mg/dL — ABNORMAL HIGH (ref 8–23)
CO2: 23 mmol/L (ref 22–32)
Calcium: 8.8 mg/dL — ABNORMAL LOW (ref 8.9–10.3)
Chloride: 114 mmol/L — ABNORMAL HIGH (ref 98–111)
Creatinine, Ser: 0.85 mg/dL (ref 0.61–1.24)
GFR, Estimated: >60 mL/min (ref >60)
Glucose, Bld: 136 mg/dL — ABNORMAL HIGH (ref 70–99)
Potassium: 3.4 mmol/L — ABNORMAL LOW (ref 3.5–5.1)
Sodium: 145 mmol/L (ref 135–145)

## 2023-02-01 LAB — GLUCOSE, CAPILLARY
Glucose-Capillary: 117 mg/dL — ABNORMAL HIGH (ref 70–99)
Glucose-Capillary: 118 mg/dL — ABNORMAL HIGH (ref 70–99)
Glucose-Capillary: 126 mg/dL — ABNORMAL HIGH (ref 70–99)
Glucose-Capillary: 141 mg/dL — ABNORMAL HIGH (ref 70–99)
Glucose-Capillary: 71 mg/dL (ref 70–99)
Glucose-Capillary: 97 mg/dL (ref 70–99)
Glucose-Capillary: 98 mg/dL (ref 70–99)

## 2023-02-01 LAB — MAGNESIUM: Magnesium: 2.2 mg/dL (ref 1.7–2.4)

## 2023-02-01 LAB — PHOSPHORUS: Phosphorus: 2.7 mg/dL (ref 2.5–4.6)

## 2023-02-01 MED ORDER — AMLODIPINE BESYLATE 10 MG PO TABS
10.0000 mg | ORAL_TABLET | Freq: Every day | ORAL | Status: DC
  Administered 2023-02-01 – 2023-02-03 (×3): 10 mg
  Filled 2023-02-01 (×3): qty 1

## 2023-02-01 MED ORDER — POTASSIUM CHLORIDE 20 MEQ PO PACK
40.0000 meq | PACK | Freq: Once | ORAL | Status: AC
  Administered 2023-02-01: 40 meq
  Filled 2023-02-01: qty 2

## 2023-02-01 MED ORDER — VITAL AF 1.2 CAL PO LIQD
1000.0000 mL | ORAL | Status: DC
Start: 2023-02-01 — End: 2023-02-04
  Administered 2023-02-02 – 2023-02-03 (×3): 1000 mL

## 2023-02-01 MED ORDER — FENTANYL CITRATE PF 50 MCG/ML IJ SOSY
25.0000 ug | PREFILLED_SYRINGE | INTRAMUSCULAR | Status: DC | PRN
  Administered 2023-02-01: 50 ug via INTRAVENOUS
  Administered 2023-02-01 – 2023-02-03 (×5): 100 ug via INTRAVENOUS
  Filled 2023-02-01: qty 1
  Filled 2023-02-01 (×5): qty 2

## 2023-02-01 MED ORDER — MIDAZOLAM HCL 2 MG/2ML IJ SOLN
2.0000 mg | INTRAMUSCULAR | Status: DC | PRN
  Administered 2023-02-01 (×2): 2 mg via INTRAVENOUS
  Filled 2023-02-01 (×2): qty 2

## 2023-02-01 MED ORDER — HYDRALAZINE HCL 20 MG/ML IJ SOLN
10.0000 mg | INTRAMUSCULAR | Status: DC | PRN
  Administered 2023-02-01 – 2023-02-03 (×3): 10 mg via INTRAVENOUS
  Filled 2023-02-01 (×3): qty 1

## 2023-02-01 NOTE — Progress Notes (Signed)
LTM EEG discontinued - no skin breakdown at unhook.   

## 2023-02-01 NOTE — Plan of Care (Signed)
  Problem: Fluid Volume: Goal: Ability to maintain a balanced intake and output will improve Outcome: Progressing   Problem: Skin Integrity: Goal: Risk for impaired skin integrity will decrease Outcome: Progressing   Problem: Tissue Perfusion: Goal: Adequacy of tissue perfusion will improve Outcome: Progressing   Problem: Nutrition: Goal: Adequate nutrition will be maintained Outcome: Progressing   Problem: Pain Management: Goal: General experience of comfort will improve Outcome: Progressing   Problem: Safety: Goal: Ability to remain free from injury will improve Outcome: Progressing   Problem: Skin Integrity: Goal: Risk for impaired skin integrity will decrease Outcome: Progressing

## 2023-02-01 NOTE — Procedures (Signed)
Patient Name: John Spears  MRN: [id number]  Epilepsy Attending: Charlsie Quest  Referring Physician/Provider: Gordy Councilman, MD  Duration: 01/31/2023 1126 to 02/01/2023 1105   Patient history: 72 year old with prior CVA, seizure disorder, LKW 8:30 AM, witnessed seizure activity getting eeg to evaluate for seizure   Level of alertness:  awake, asleep   AEDs during EEG study:  LEV, PHT, Phenobarb   Technical aspects: This EEG study was done with scalp electrodes positioned according to the 10-20 International system of electrode placement. Electrical activity was reviewed with band pass filter of 1-70Hz , sensitivity of 7 uV/mm, display speed of 61mm/sec with a 60Hz  notched filter applied as appropriate. EEG data were recorded continuously and digitally stored.  Video monitoring was available and reviewed as appropriate.   Description: No clear posterior dominant rhythm was seen. Sleep was characterized by sleep spindles (12-14Hz ), maximal fronto-central region. EEG showed  continuous generalized and lateralized left hemisphere 3 to 6 Hz delta slowing with overriding 13-15hz  beta activity. Sharp waves were noted in left hemisphere at 1hz .      ABNORMALITY - Sharp waves, left hemisphere  - Continuous slow, generalized and lateralized left hemisphere   IMPRESSION: This study showed evidence of epileptogenicity arising from  left hemisphere. Additionally there is cortical dysfunction in left hemisphere likely secondary to underlying structural abnormality/post-ictal state.  Lastly there is moderate diffuse encephalopathy, likely related to sedation. No seizures were noted.   Lametria Klunk Annabelle Harman

## 2023-02-01 NOTE — Progress Notes (Signed)
NEUROLOGY CONSULT FOLLOW UP NOTE   Date of service: February 01, 2023 Patient Name: John Spears MRN:  [id number] DOB:  [birth year]  Brief HPI  John Spears is a 72 year old male with past medical history ETOH abuse,  HF with improved EF, MCA stroke, HTN, seizure disorder who presented with  AMS and breakthrough seizure on 12/3   Interval Hx/subjective   Patient remains intubated. On precedex @ 0.38mcg IV  No family at the bedside.  Remains on LTM.  LTM read with sharp waves, left hemisphere. No seizure activity noted by nursing staff overnight  He opens eyes and follows commands. He does get agitated during exam.  K is 3.4, BUN 30.   Phenobarb was discontinued yesterday  Vitals   Vitals:   02/01/23 0400 02/01/23 0500 02/01/23 0600 02/01/23 0700  BP: (!) 178/74 (!) 164/75 (!) 178/74 (!) 179/77  Pulse: (!) 53 (!) 52 (!) 53 (!) 48  Resp: 15 13 15 14   Temp: (!) 97 F (36.1 C)     TempSrc: Axillary     SpO2: 99% 100% 100% 100%  Weight:      Height:         Body mass index is 24.94 kg/m.  Physical Exam   Constitutional: critically ill on ventilator Psych: unable to assess Eyes: No scleral injection.  HENT: No OP obstrucion.  Head: Normocephalic. ET tube, coretrack tube in place  Cardiovascular: Normal rate and regular rhythm.  Respiratory: Effort normal, non-labored breathing on ventilator  GI: Soft.  No distension. There is no tenderness.  Skin: WDI.   Neurologic Examination   On precedex gtt   Neuro: Eyes are closed, opens eyes to voice and stimulation. Follows commands, PERRL, eyes are midline, blinks to threat bilaterally, corneal reflex intact, (+) cough and gag, moves all 4 extremities x 4 and can hold antigravity. Right arm appears to be slightly weaker than left arm, sensation intact bilaterally  Labs and Diagnostic Imaging   CBC:  Recent Labs  Lab 01/31/23 0520 02/01/23 0425  WBC 11.9* 9.2  NEUTROABS 7.7 5.7  HGB 9.7* 9.5*  HCT 30.9* 30.3*   MCV 92.8 92.9  PLT 110* 132*    Basic Metabolic Panel:  Lab Results  Component Value Date   NA 145 02/01/2023   K 3.4 (L) 02/01/2023   CO2 23 02/01/2023   GLUCOSE 136 (H) 02/01/2023   BUN 30 (H) 02/01/2023   CREATININE 0.85 02/01/2023   CALCIUM 8.8 (L) 02/01/2023   GFRNONAA >60 02/01/2023   GFRAA >60 04/04/2019   Lipid Panel:  Lab Results  Component Value Date   LDLCALC 133 (H) 03/08/2019   HgbA1c:  Lab Results  Component Value Date   HGBA1C 5.3 01/26/2023   Urine Drug Screen:     Component Value Date/Time   LABOPIA NONE DETECTED 01/27/2023 1800   COCAINSCRNUR NONE DETECTED 01/27/2023 1800   COCAINSCRNUR NONE DETECTED 02/20/2022 1652   LABBENZ POSITIVE (A) 01/27/2023 1800   AMPHETMU NONE DETECTED 01/27/2023 1800   THCU NONE DETECTED 01/27/2023 1800   LABBARB POSITIVE (A) 01/27/2023 1800    Alcohol Level     Component Value Date/Time   ETH <10 01/26/2023 0954   INR  Lab Results  Component Value Date   INR 1.1 01/26/2023   APTT  Lab Results  Component Value Date   APTT 24 01/26/2023   AED levels:  Lab Results  Component Value Date   PHENYTOIN 13.9 01/28/2023    CT Head  without contrast:  01/26/2023: No hemorrhage or CT evidence of an acute cortical infarct  Encephalomalacia in the left temporal and occipital lobes is unchanged consistent with prior infarct  cEEG:  01/28/2023 1126 to 01/29/2023 0630  This study initially showed seizures without clinical signs arising from left hemisphere,  26 seizures were noted, lasting about 30-45 seconds each. Patient was loaded with fosphenytoin at 1238 on 01/28/2023. Last seizure was noted on 01/28/2023 at 1250.  EEG also showed cortical dysfunction in left hemisphere likely secondary to underlying structural abnormality, postictal state. Additionally there was severe diffuse encephalopathy, likely related to sedation.   12/6 - 12/7 This study is suggestive of severe diffuse encephalopathy, likely related to  sedation. No seizures or epileptiform discharges were noted.   12/7-12/8 This study showed evidence of epileptogenicity arising from  left hemisphere. Additionally there is cortical dysfunction in left hemisphere likely secondary to underlying structural abnormality/post-ictal state.  Lastly there is moderate diffuse encephalopathy, likely related to sedation. No seizures were noted.   12/8-12/9 This study showed evidence of epileptogenicity arising from  left hemisphere. Additionally there is cortical dysfunction in left hemisphere likely secondary to underlying structural abnormality/post-ictal state.  Lastly there is moderate diffuse encephalopathy, likely related to sedation. No seizures were noted.   Impression   John Spears is a 72 y.o. male  who presented on 01/26/2023 with right gaze deviation, right-sided jerking and secondary generalization.    Status epilepticus, resolved Alcohol use disorder Hypoglycemia, resolved Pneumonia, worsening   Etiology of status epilepticus: Patient already on Keppra at home and missed his morning dose on the day of admission.  Also blood glucose was 60, magnesium was 1.6 and there was history of  alcohol use with undetectable alcohol level on admission concerning for alcohol withdrawal   Recommendations  -Seizure precautions  -Continue phenytoin 100 mg every 8 hours at this time  -Continue Keppra 1000 mg BID  - Neurology will continue to follow  ______________________________________________________________________   Thank you for the opportunity to take part in the care of this patient. If you have any further questions, please contact the neurology consultation team on call. Updated oncall schedule is listed on AMION.  Signed,  Gevena Mart DNP, ACNPC-AG  [facility]  I have seen the patient and reviewed the above note.  His EEG seems to be improving, and he is readily following commands.  Breakthrough seizures in the  setting of alcohol use disorder and underlying seizure predisposition.  At this point, I would continue his current antiepileptics.  With no seizures in multiple days, feel we can stop LTM EEG.  Neurology will follow.

## 2023-02-01 NOTE — Progress Notes (Signed)
Nutrition Follow-up  DOCUMENTATION CODES:   Not applicable  INTERVENTION:   Increase Vital AF 1.2 to 70 ml/h (1680 ml per day). D/C Prosource TF 20. Provides 2016 kcal, 126 gm protein, 1362 ml free water daily.  Free water flushes 200 ml every 4 hours provides a total of 2562 ml free water daily. Recommend adjust free water provision as needed to maintain normal sodium level.    NUTRITION DIAGNOSIS:   Inadequate oral intake related to inability to eat (pt sedated and ventilated) as evidenced by NPO status.  Ongoing   GOAL:   Provide needs based on ASPEN/SCCM guidelines  Met with TF  MONITOR:   Vent status, Labs, Weight trends, TF tolerance, I & O's, Skin  REASON FOR ASSESSMENT:   Consult Enteral/tube feeding initiation and management  ASSESSMENT:   72 y/o male with h/o CVA, seizures, dilated cardiomyopathy, CHF, HTN, HLD, etoh abuse, marijuana use and CKD III who is admitted with seizures and AKI.  Discussed patient in ICU rounds and with RN today. Patient remains intubated. Mental status improving. Following commands this morning.  Free water increased yesterday d/t hypernatremia.   Patient is currently intubated on ventilator support MV: 11.8 L/min Temp (24hrs), Avg:97.7 F (36.5 C), Min:97 F (36.1 C), Max:98.3 F (36.8 C)   TF order: Vital AF 1.2 at 50 ml/h with Prosource TF20 60 ml BID providing 1600 kcal, 130 gm protein, 973 ml free water daily. Free water flushes 200 ml every 4 hours for a total of 2173 ml free water daily. Patient is tolerating TF well.   Labs reviewed. K 3.4, BUN 30 CBG: [phone]  Medications reviewed and include pepcid, folic acid, MVI with minerals, thiamine, precedex, keppra. Received KCl x 1 this morning.   Admit weight: 82.4 kg Current weight: 81.1 kg   Diet Order:   Diet Order             Diet NPO time specified  Diet effective now                   EDUCATION NEEDS:   No education needs have been  identified at this time  Skin:  Skin Assessment: Reviewed RN Assessment  Last BM:  12/9 type 7, fecal management system, 200 ml x 24 hours  Height:   Ht Readings from Last 1 Encounters:  01/26/23 5\' 11"  (1.803 m)    Weight:   Wt Readings from Last 1 Encounters:  01/30/23 81.1 kg    Ideal Body Weight:  78.1 kg  BMI:  Body mass index is 24.94 kg/m.  Estimated Nutritional Needs:   Kcal:  2000-2200  Protein:  115-130g/day  Fluid:  2.0-2.3L/day   Gabriel Rainwater RD, LDN, CNSC Please refer to Amion for contact information.

## 2023-02-01 NOTE — Progress Notes (Signed)
NAME:  John Spears, MRN:  [id number], DOB:  Jan 12, 1951, LOS: 6 ADMISSION DATE:  01/26/2023, CONSULTATION DATE:  01/26/2023 REFERRING MD:  Andria Meuse, EDP, CHIEF COMPLAINT:  seizure   History of Present Illness:  72 year old male with past medical history of heart failure with recovered EF, MCA stroke, hypertension, alcohol use disorder, seizure disorder who presented to the ED via EMS with seizure, altered mental status. Patient unable to participate in history, therefore HPI obtained from ED, neurology notes. Per chart review, patient was up this AM making his bed when sister, Kathie Rhodes, heard a fall/loud noise around 0830. She found patient lying on the floor and called 911 with concern of seizure activity. Notes he has been compliant with AEDs, unclear on his alcohol use, and eating "okay." Per ED triage notes, EMS noted seizure activity, right tremor, leftward gaze. He was given 5mg  versed which did not break seizure activity, and given subsequent 2.5mg  with abatement. He was intubated immediately upon arrival. He was called a code stroke and taken to CT with no evidence of bleed. He was loaded with keppra and started on propofol/fentanyl and admitted to [facility].   Pertinent  Medical History  heart failure with recovered EF, MCA stroke, hypertension, alcohol use disorder, seizure disorder  Significant Hospital Events: Including procedures, antibiotic start and stop dates in addition to other pertinent events   12/3 - Admitted to ICU after seizure, altered mental status. CT head negative. Seen by neuro. LTM started.  12/4 - Heavily sedated no obvious seizure. Prop was weaned off. Then versed. Had break thru seizure w/ worse exam. Got bolus of versed f/b resuming gtt and also started pheno load and taper 12/5 - On a little higher versed than day prior. Still on LTM. Exam perhaps a little better BUT still having breakthrough seizures, Versed increased. 12/6 - No seizures overnight, Versed weaned to  5  Interim History / Subjective:   Remains intubated, follows commands but drowsy Trial PSV  10/5 with RR to 40s Vt mid 200s  Objective:  Blood pressure (!) 175/76, pulse 65, temperature 97.6 F (36.4 C), temperature source Oral, resp. rate 13, height 5\' 11"  (1.803 m), weight 81.1 kg, SpO2 100%.    Vent Mode: PRVC FiO2 (%):  [30 %] 30 % Set Rate:  [14 bmp] 14 bmp Vt Set:  [600 mL] 600 mL PEEP:  [5 cmH20] 5 cmH20 Pressure Support:  [8 cmH20] 8 cmH20 Plateau Pressure:  [15 cmH20-17 cmH20] 17 cmH20   Intake/Output Summary (Last 24 hours) at 02/01/2023 1101 Last data filed at 02/01/2023 1000 Gross per 24 hour  Intake 1963.08 ml  Output 1050 ml  Net 913.08 ml   Filed Weights   01/28/23 0500 01/29/23 0703 01/30/23 0500  Weight: 77.8 kg 83.4 kg 81.1 kg   Physical Examination: General: Acutely ill-appearing older man in NAD. Intubated, off sedation HEENT: Blunt/AT, anicteric sclera, PERRL, moist mucous membranes. Neuro: Eyes open, follows commands, tracks CV: RRR, no m/g/r. PULM: No secretions, no accessory muscle use, bilateral ventilated breath sounds GI: Soft, nontender, nondistended. Hypoactive bowel sounds. Extremities: Trace bilateral symmetric LE edema noted. Skin: Warm/dry, no rashes.  Labs and images reviewed  Resolved Hospital Problem List:     Assessment & Plan:  Acute metabolic encephalopathy s/p Break thru Seizures w/ remote h/o stroke exacerbated by ETOH and prob ETOH wd seizure and cont to have subclinical seizures per LTM on 12/5 - Neuro following, appreciate recommendations - LTM EEG per neuro >> no seizures seen 12/8  and 12/9 - AEDs per Neuro (S/p fosphenytoin; continue Keppra, phenytoin), Versed gtt weaned off - Seizure precautions - Neuroprotective measures: HOB > 30 degrees, normoglycemia, normothermia, electrolytes WNL  Acute respiratory failure due to acute encephalopathy and seizures c/b aspiration PNA  -Tolerating spontaneous breathing trial, poor  mental status precludes extubation although improving - VAP bundle - Unasyn x 5-day course, end 12/7 -Add saline nebs with tracheobronchial toilet  Heart failure; most recent echo 06/2022 with EF 60-65%, mild LVH, G1DD  - Coreg on hold due to bradycardia - Hold diuretics (Lasix), restart as clinically appropriate  AKI, resolved Hypophosphatemia, improved  Hypernatremia  -FWF  Hyperlipidemia and h/o Hypertension  - Continue statin - amlodipine, holding Coreg with bradycardia  Alcohol use disorder; ethanol negative in ED. Unclear last drink  - Monitor for signs/symptoms of withdrawal -Phenobarb d/c'd , prefer to use Precedex with goal RASS 0 to -1 to facilitate extubation -Doubt he will need more benzodiazepines - Continue thiamine, folate, MV  Best Practice (right click and "Reselect all SmartList Selections" daily)   Diet/type: tubefeeds DVT prophylaxis: LMWH Pressure ulcer(s). pressure injury stage n/a was NOT present on admission GI prophylaxis: H2B Lines: N/A Foley:  Yes, and it is still needed Code Status:   full code Last date of multidisciplinary goals of care discussion -unable to reach family  Critical care time:    CRITICAL CARE Performed by: Karren Burly   Total critical care time: 32 minutes  Critical care time was exclusive of separately billable procedures and treating other patients.  Critical care was necessary to treat or prevent imminent or life-threatening deterioration.  Critical care was time spent personally by me on the following activities: development of treatment plan with patient and/or surrogate as well as nursing, discussions with consultants, evaluation of patient's response to treatment, examination of patient, obtaining history from patient or surrogate, ordering and performing treatments and interventions, ordering and review of laboratory studies, ordering and review of radiographic studies, pulse oximetry and re-evaluation of  patient's condition.   Karren Burly, MD [facility] 02/01/23 11:01 AM  Please see [email or web address] for pager details.  From 7A-7P if no response, please call [phone] After hours, please call ELink [phone]

## 2023-02-02 DIAGNOSIS — R4182 Altered mental status, unspecified: Secondary | ICD-10-CM | POA: Diagnosis not present

## 2023-02-02 DIAGNOSIS — G40901 Epilepsy, unspecified, not intractable, with status epilepticus: Secondary | ICD-10-CM | POA: Diagnosis not present

## 2023-02-02 LAB — PHENYTOIN LEVEL, TOTAL: Phenytoin Lvl: 11.9 ug/mL (ref 10.0–20.0)

## 2023-02-02 LAB — COMPREHENSIVE METABOLIC PANEL
ALT: 13 U/L (ref 0–44)
AST: 23 U/L (ref 15–41)
Albumin: 2.3 g/dL — ABNORMAL LOW (ref 3.5–5.0)
Alkaline Phosphatase: 55 U/L (ref 38–126)
Anion gap: 8 (ref 5–15)
BUN: 29 mg/dL — ABNORMAL HIGH (ref 8–23)
CO2: 26 mmol/L (ref 22–32)
Calcium: 9.4 mg/dL (ref 8.9–10.3)
Chloride: 111 mmol/L (ref 98–111)
Creatinine, Ser: 1 mg/dL (ref 0.61–1.24)
GFR, Estimated: >60 mL/min (ref >60)
Glucose, Bld: 95 mg/dL (ref 70–99)
Potassium: 4.5 mmol/L (ref 3.5–5.1)
Sodium: 145 mmol/L (ref 135–145)
Total Bilirubin: 0.4 mg/dL (ref <1.2)
Total Protein: 6.1 g/dL — ABNORMAL LOW (ref 6.5–8.1)

## 2023-02-02 LAB — GLUCOSE, CAPILLARY
Glucose-Capillary: 100 mg/dL — ABNORMAL HIGH (ref 70–99)
Glucose-Capillary: 102 mg/dL — ABNORMAL HIGH (ref 70–99)
Glucose-Capillary: 69 mg/dL — ABNORMAL LOW (ref 70–99)
Glucose-Capillary: 69 mg/dL — ABNORMAL LOW (ref 70–99)
Glucose-Capillary: 94 mg/dL (ref 70–99)
Glucose-Capillary: 95 mg/dL (ref 70–99)
Glucose-Capillary: 95 mg/dL (ref 70–99)
Glucose-Capillary: 96 mg/dL (ref 70–99)

## 2023-02-02 MED ORDER — FUROSEMIDE 10 MG/ML IJ SOLN
40.0000 mg | Freq: Once | INTRAMUSCULAR | Status: AC
  Administered 2023-02-02: 40 mg via INTRAVENOUS
  Filled 2023-02-02: qty 4

## 2023-02-02 MED ORDER — DEXTROSE 50 % IV SOLN
INTRAVENOUS | Status: AC
  Administered 2023-02-02: 25 mL
  Filled 2023-02-02: qty 50

## 2023-02-02 MED ORDER — MIDAZOLAM HCL 2 MG/2ML IJ SOLN
2.0000 mg | INTRAMUSCULAR | Status: DC | PRN
  Administered 2023-02-02 – 2023-02-03 (×3): 2 mg via INTRAVENOUS
  Filled 2023-02-02 (×4): qty 2

## 2023-02-02 MED ORDER — PHENYTOIN SODIUM 50 MG/ML IJ SOLN
75.0000 mg | Freq: Three times a day (TID) | INTRAMUSCULAR | Status: DC
Start: 2023-02-02 — End: 2023-02-04
  Administered 2023-02-02 – 2023-02-04 (×6): 75 mg via INTRAVENOUS
  Filled 2023-02-02 (×6): qty 2

## 2023-02-02 NOTE — Plan of Care (Signed)
Patient admitted s/p seizure. Patient able to intermittently follow commands. Increased secretions noted, frequent mouth care and lasix given. PRNs given for pain and agitation, patient showed signs of relief. Patient updated in plan of care. ICU status maintained.   Problem: Education: Goal: Ability to describe self-care measures that may prevent or decrease complications (Diabetes Survival Skills Education) will improve Outcome: Progressing Goal: Individualized Educational Video(s) Outcome: Progressing   Problem: Coping: Goal: Ability to adjust to condition or change in health will improve Outcome: Progressing   Problem: Fluid Volume: Goal: Ability to maintain a balanced intake and output will improve Outcome: Progressing   Problem: Health Behavior/Discharge Planning: Goal: Ability to identify and utilize available resources and services will improve Outcome: Progressing Goal: Ability to manage health-related needs will improve Outcome: Progressing   Problem: Metabolic: Goal: Ability to maintain appropriate glucose levels will improve Outcome: Progressing   Problem: Nutritional: Goal: Maintenance of adequate nutrition will improve Outcome: Progressing Goal: Progress toward achieving an optimal weight will improve Outcome: Progressing   Problem: Skin Integrity: Goal: Risk for impaired skin integrity will decrease Outcome: Progressing   Problem: Tissue Perfusion: Goal: Adequacy of tissue perfusion will improve Outcome: Progressing   Problem: Education: Goal: Knowledge of General Education information will improve Description: Including pain rating scale, medication(s)/side effects and non-pharmacologic comfort measures Outcome: Progressing   Problem: Health Behavior/Discharge Planning: Goal: Ability to manage health-related needs will improve Outcome: Progressing   Problem: Clinical Measurements: Goal: Ability to maintain clinical measurements within normal limits  will improve Outcome: Progressing Goal: Will remain free from infection Outcome: Progressing Goal: Diagnostic test results will improve Outcome: Progressing Goal: Respiratory complications will improve Outcome: Progressing Goal: Cardiovascular complication will be avoided Outcome: Progressing   Problem: Activity: Goal: Risk for activity intolerance will decrease Outcome: Progressing   Problem: Nutrition: Goal: Adequate nutrition will be maintained Outcome: Progressing   Problem: Coping: Goal: Level of anxiety will decrease Outcome: Progressing   Problem: Elimination: Goal: Will not experience complications related to bowel motility Outcome: Progressing Goal: Will not experience complications related to urinary retention Outcome: Progressing   Problem: Pain Management: Goal: General experience of comfort will improve Outcome: Progressing   Problem: Safety: Goal: Ability to remain free from injury will improve Outcome: Progressing   Problem: Skin Integrity: Goal: Risk for impaired skin integrity will decrease Outcome: Progressing   Problem: Safety: Goal: Non-violent Restraint(s) Outcome: Progressing

## 2023-02-02 NOTE — Progress Notes (Addendum)
NEUROLOGY CONSULT FOLLOW UP NOTE   Date of service: February 02, 2023 Patient Name: John Spears MRN:  [id number] DOB:  Jun 08, 1950  Brief HPI  John Spears is a 72 year old male with past medical history ETOH abuse,  HF with improved EF, MCA stroke, HTN, seizure disorder who presented with  AMS and breakthrough seizure on 12/3.   Interval Hx/subjective  No seizure activity reported overnight. Remains intubated, did not tolerate SBT this morning due to inability to follow commands; precedex now turned off per RN with possible additional SBT later in the day. No family at bedside.   Opens eyes, follows commands. Some agitation noted but not much.  LTM discontinued 12/09.  Vitals   Vitals:   02/02/23 0500 02/02/23 0600 02/02/23 0700 02/02/23 0729  BP: (!) 170/70 (!) 148/65 (!) 161/71   Pulse:  (!) 50 (!) 47 (!) 50  Resp: 14 14 14 15   Temp:      TempSrc:      SpO2:  100% 100% 100%  Weight:      Height:         Body mass index is 24.94 kg/m.  Physical Exam   Constitutional: Elderly gentleman, appears stated age. Intubated, in no acute distress.  Eyes: No scleral injection. PERLL. HENT: Intubated. Cardiovascular: Normal rate and regular rhythm.  Respiratory: Normal work of breathing on PRVC.  GI: Soft.  No distension. There is no tenderness.  Skin: WDI. Minimal pitting edema noted to bilateral UE.   Neurologic Examination   Opens eyes to voice and follows commands. PERRL. Lifts bilateral UE and LE against gravity. Shoulder shrug intact and symmetric. Facial movements are symmetric. Has corneal reflex, cough reflex, gag reflex.   Medications  Current Facility-Administered Medications:    acetaminophen (TYLENOL) 160 MG/5ML solution 650 mg, 650 mg, Oral, Q6H PRN, Gaetana Michaelis, MD, 650 mg at 01/31/23 0023   amLODipine (NORVASC) tablet 10 mg, 10 mg, Per Tube, Daily, Hunsucker, Lesia Sago, MD, 10 mg at 02/01/23 0909   atorvastatin (LIPITOR) tablet 40 mg, 40 mg,  Per Tube, Daily, Calton Dach I, RPH, 40 mg at 02/01/23 0907   Chlorhexidine Gluconate Cloth 2 % PADS 6 each, 6 each, Topical, Daily, Oretha Milch, MD, 6 each at 02/02/23 0128   dexmedetomidine (PRECEDEX) 400 MCG/100ML (4 mcg/mL) infusion, 0-1.2 mcg/kg/hr, Intravenous, Titrated, Oretha Milch, MD, Last Rate: 16.22 mL/hr at 02/02/23 0700, 0.8 mcg/kg/hr at 02/02/23 0700   docusate (COLACE) 50 MG/5ML liquid 100 mg, 100 mg, Per Tube, BID PRN, Oretha Milch, MD, 100 mg at 01/30/23 1644   enoxaparin (LOVENOX) injection 40 mg, 40 mg, Subcutaneous, Q24H, Autry, Lauren E, PA-C, 40 mg at 02/01/23 1127   famotidine (PEPCID) tablet 20 mg, 20 mg, Per Tube, BID, Cherlynn Polo, Lauren E, PA-C, 20 mg at 02/01/23 2107   feeding supplement (VITAL AF 1.2 CAL) liquid 1,000 mL, 1,000 mL, Per Tube, Continuous, Hunsucker, Lesia Sago, MD, Last Rate: 70 mL/hr at 02/02/23 0700, Infusion Verify at 02/02/23 0700   fentaNYL (SUBLIMAZE) injection 25 mcg, 25 mcg, Intravenous, Once, Autry, Lauren E, PA-C   fentaNYL (SUBLIMAZE) injection 25-100 mcg, 25-100 mcg, Intravenous, Q30 min PRN, Hunsucker, Lesia Sago, MD, 100 mcg at 02/01/23 1021   folic acid (FOLVITE) tablet 1 mg, 1 mg, Per Tube, Daily, Calton Dach I, RPH, 1 mg at 02/01/23 4098   free water 200 mL, 200 mL, Per Tube, Q4H, Oretha Milch, MD, 200 mL at 02/02/23 [phone]  hydrALAZINE (APRESOLINE) injection 10 mg, 10 mg, Intravenous, Q4H PRN, Gevena Mart A, NP, 10 mg at 02/02/23 0128   levETIRAcetam (KEPPRA) IVPB 1000 mg/100 mL premix, 1,000 mg, Intravenous, Q12H, Bhagat, Srishti L, MD, Stopped at 02/01/23 2123   midazolam (VERSED) injection 2 mg, 2 mg, Intravenous, Q4H PRN, Hunsucker, Lesia Sago, MD   multivitamin with minerals tablet 1 tablet, 1 tablet, Per Tube, Daily, Calton Dach I, RPH, 1 tablet at 02/01/23 0907   Oral care mouth rinse, 15 mL, Mouth Rinse, Q2H, Oretha Milch, MD, 15 mL at 02/02/23 0538   Oral care mouth rinse, 15 mL, Mouth Rinse, PRN, Oretha Milch, MD   phenytoin (DILANTIN) injection 100 mg, 100 mg, Intravenous, Q8H, Dang, Thuy D, RPH, 100 mg at 02/02/23 0538   polyethylene glycol (MIRALAX / GLYCOLAX) packet 17 g, 17 g, Per Tube, Daily PRN, Cherlynn Polo, Lauren E, PA-C   [COMPLETED] thiamine (VITAMIN B1) 500 mg in sodium chloride 0.9 % 50 mL IVPB, 500 mg, Intravenous, Q8H, Stopped at 01/29/23 0734 **FOLLOWED BY** thiamine (VITAMIN B1) 250 mg in sodium chloride 0.9 % 50 mL IVPB, 250 mg, Intravenous, Daily, Last Rate: 105 mL/hr at 02/01/23 0914, 250 mg at 02/01/23 0914 **FOLLOWED BY** [START ON 02/04/2023] thiamine (VITAMIN B1) injection 100 mg, 100 mg, Intravenous, Daily, Gordy Councilman, MD [facility]   CBC:  Recent Labs  Lab 01/31/23 0520 02/01/23 0425  WBC 11.9* 9.2  NEUTROABS 7.7 5.7  HGB 9.7* 9.5*  HCT 30.9* 30.3*  MCV 92.8 92.9  PLT 110* 132*    Basic Metabolic Panel:  Lab Results  Component Value Date   NA 145 02/01/2023   K 3.4 (L) 02/01/2023   CO2 23 02/01/2023   GLUCOSE 136 (H) 02/01/2023   BUN 30 (H) 02/01/2023   CREATININE 0.85 02/01/2023   CALCIUM 8.8 (L) 02/01/2023   GFRNONAA >60 02/01/2023   GFRAA >60 04/04/2019   Lipid Panel:  Lab Results  Component Value Date   LDLCALC 133 (H) 03/08/2019   HgbA1c:  Lab Results  Component Value Date   HGBA1C 5.3 01/26/2023   Urine Drug Screen:     Component Value Date/Time   LABOPIA NONE DETECTED 01/27/2023 1800   COCAINSCRNUR NONE DETECTED 01/27/2023 1800   COCAINSCRNUR NONE DETECTED 02/20/2022 1652   LABBENZ POSITIVE (A) 01/27/2023 1800   AMPHETMU NONE DETECTED 01/27/2023 1800   THCU NONE DETECTED 01/27/2023 1800   LABBARB POSITIVE (A) 01/27/2023 1800    Alcohol Level     Component Value Date/Time   ETH <10 01/26/2023 0954   INR  Lab Results  Component Value Date   INR 1.1 01/26/2023   APTT  Lab Results  Component Value Date   APTT 24 01/26/2023   AED levels:  Lab Results  Component Value Date   PHENYTOIN 13.9  01/28/2023    CT Head without contrast(Personally reviewed): 01/26/2023: No hemorrhage or CT evidence of an acute cortical infarct  Encephalomalacia in the left temporal and occipital lobes is unchanged consistent with prior infarct.  cEEG:  01/28/2023 1126 to 01/29/2023 0630  This study initially showed seizures without clinical signs arising from left hemisphere,  26 seizures were noted, lasting about 30-45 seconds each. Patient was loaded with fosphenytoin at 1238 on 01/28/2023. Last seizure was noted on 01/28/2023 at 1250.  EEG also showed cortical dysfunction in left hemisphere likely secondary to underlying structural abnormality, postictal state. Additionally there was severe diffuse encephalopathy, likely related to sedation.   12/6 -  12/7 This study is suggestive of severe diffuse encephalopathy, likely related to sedation. No seizures or epileptiform discharges were noted.    12/7-12/8 This study showed evidence of epileptogenicity arising from  left hemisphere. Additionally there is cortical dysfunction in left hemisphere likely secondary to underlying structural abnormality/post-ictal state.  Lastly there is moderate diffuse encephalopathy, likely related to sedation. No seizures were noted.    12/8-12/9 This study showed evidence of epileptogenicity arising from  left hemisphere. Additionally there is cortical dysfunction in left hemisphere likely secondary to underlying structural abnormality/post-ictal state.  Lastly there is moderate diffuse encephalopathy, likely related to sedation. No seizures were noted.  Assessment   Crewe Skeeter is a 72 y.o. male  who presented on 01/26/2023 with right gaze deviation, right-sided jerking and secondary generalization.    Status epilepticus, resolved Alcohol use disorder   Etiology of status epilepticus: Likely breakthrough seizure in setting of alcohol use disorder/possible alcohol withdrawal (EtOH undetectable at admission) in  addition to missed Keppra dose on morning of admission.   Recommendations  -Seizure precautions -Continue keppra 1,000 mg IV q12h -Continue phenytoin 100 mg IV q8h -Neurology will continue to follow ______________________________________________________________________   Signed, Champ Mungo, DO Internal Medicine PGY-3   I have seen the patient and reviewed the note of Dr. August Saucer.  We will check back once the patient is extubated, will continue current AED regimen in the interim.  Ritta Slot, MD [facility] [phone]  If 7pm- 7am, please page neurology on call as listed in AMION.

## 2023-02-02 NOTE — Progress Notes (Signed)
NAME:  John Spears, MRN:  [id number], DOB:  Jun 25, 1950, LOS: 7 ADMISSION DATE:  01/26/2023, CONSULTATION DATE:  01/26/2023 REFERRING MD:  Andria Meuse, EDP, CHIEF COMPLAINT:  seizure   History of Present Illness:  72 year old male with past medical history of heart failure with recovered EF, MCA stroke, hypertension, alcohol use disorder, seizure disorder who presented to the ED via EMS with seizure, altered mental status. Patient unable to participate in history, therefore HPI obtained from ED, neurology notes. Per chart review, patient was up this AM making his bed when sister, Kathie Rhodes, heard a fall/loud noise around 0830. She found patient lying on the floor and called 911 with concern of seizure activity. Notes he has been compliant with AEDs, unclear on his alcohol use, and eating "okay." Per ED triage notes, EMS noted seizure activity, right tremor, leftward gaze. He was given 5mg  versed which did not break seizure activity, and given subsequent 2.5mg  with abatement. He was intubated immediately upon arrival. He was called a code stroke and taken to CT with no evidence of bleed. He was loaded with keppra and started on propofol/fentanyl and admitted to [facility].   Pertinent  Medical History  heart failure with recovered EF, MCA stroke, hypertension, alcohol use disorder, seizure disorder  Significant Hospital Events: Including procedures, antibiotic start and stop dates in addition to other pertinent events   12/3 - Admitted to ICU after seizure, altered mental status. CT head negative. Seen by neuro. LTM started.  12/4 - Heavily sedated no obvious seizure. Prop was weaned off. Then versed. Had break thru seizure w/ worse exam. Got bolus of versed f/b resuming gtt and also started pheno load and taper 12/5 - On a little higher versed than day prior. Still on LTM. Exam perhaps a little better BUT still having breakthrough seizures, Versed increased. 12/6 - No seizures overnight, Versed weaned to  5 12/9 tried PSV but failed due to tachypnea, follows commands but drowsy, on vent  Interim History / Subjective:   Remains intubated, follows commands but drowsy Trial PSV  10/5 with apnea  Objective:  Blood pressure (!) 162/68, pulse 64, temperature 97.9 F (36.6 C), temperature source Axillary, resp. rate (!) 25, height 5\' 11"  (1.803 m), weight 81 kg, SpO2 100%.    Vent Mode: PRVC FiO2 (%):  [30 %] 30 % Set Rate:  [12 bmp-14 bmp] 12 bmp Vt Set:  [600 mL] 600 mL PEEP:  [5 cmH20] 5 cmH20 Plateau Pressure:  [16 cmH20-25 cmH20] 25 cmH20   Intake/Output Summary (Last 24 hours) at 02/02/2023 0906 Last data filed at 02/02/2023 0800 Gross per 24 hour  Intake 2170.61 ml  Output 1050 ml  Net 1120.61 ml   Filed Weights   01/29/23 0703 01/30/23 0500 02/02/23 0708  Weight: 83.4 kg 81.1 kg 81 kg   Physical Examination: General: Acutely ill-appearing older man in NAD. Intubated, off sedation HEENT: [place]/AT, anicteric sclera, PERRL, moist mucous membranes. Neuro: Eyes open, follows commands, tracks, drowsy CV: RRR, no m/g/r. PULM: No secretions, no accessory muscle use, bilateral ventilated breath sounds GI: Soft, nontender, nondistended. Hypoactive bowel sounds. Extremities: Trace bilateral symmetric LE edema noted. Skin: Warm/dry, no rashes.  Labs and images reviewed  Resolved Hospital Problem List:     Assessment & Plan:  Acute metabolic encephalopathy s/p Break thru Seizures w/ remote h/o stroke exacerbated by ETOH and prob ETOH wd seizure and cont to have subclinical seizures per LTM on 12/5 - Neuro following, appreciate recommendations - LTM EEG per neuro >>  no seizures seen 12/8 and 12/9 - AEDs per Neuro (S/p fosphenytoin; continue Keppra, phenytoin), Versed gtt weaned off - Seizure precautions - Neuroprotective measures: HOB > 30 degrees, normoglycemia, normothermia, electrolytes WNL  Acute respiratory failure due to acute encephalopathy and seizures c/b aspiration PNA   -failing SBT, apneic, reduce rate on PRVC and try again - VAP bundle - Unasyn x 5-day course, end 12/7 -Add saline nebs with tracheobronchial toilet  Heart failure; most recent echo 06/2022 with EF 60-65%, mild LVH, G1DD  - Coreg on hold due to bradycardia - Hold diuretics (Lasix), restart as clinically appropriate  AKI, resolved Hypophosphatemia, improved  Hypernatremia: Improved. -FWF  Hyperlipidemia and h/o Hypertension  - Continue statin - amlodipine, holding Coreg with bradycardia  Alcohol use disorder; ethanol negative in ED. Unclear last drink  - Monitor for signs/symptoms of withdrawal -Phenobarb d/c'd , prefer to use Precedex with goal RASS 0 to -1 to facilitate extubation - Midazolam PRN - Continue thiamine, folate, MV  Best Practice (right click and "Reselect all SmartList Selections" daily)   Diet/type: tubefeeds DVT prophylaxis: LMWH Pressure ulcer(s). pressure injury stage n/a was NOT present on admission GI prophylaxis: H2B Lines: N/A Foley:  Yes, and it is still needed Code Status:   full code Last date of multidisciplinary goals of care discussion -unable to reach family  Critical care time:    CRITICAL CARE Performed by: Karren Burly   Total critical care time: 30 minutes  Critical care time was exclusive of separately billable procedures and treating other patients.  Critical care was necessary to treat or prevent imminent or life-threatening deterioration.  Critical care was time spent personally by me on the following activities: development of treatment plan with patient and/or surrogate as well as nursing, discussions with consultants, evaluation of patient's response to treatment, examination of patient, obtaining history from patient or surrogate, ordering and performing treatments and interventions, ordering and review of laboratory studies, ordering and review of radiographic studies, pulse oximetry and re-evaluation of patient's  condition.   Karren Burly, MD [facility] 02/02/23 9:06 AM  Please see [email or web address] for pager details.  From 7A-7P if no response, please call [phone] After hours, please call ELink [phone]

## 2023-02-02 NOTE — Progress Notes (Signed)
Phenytoin Initial Consult Indication: status epilepticus   No Known Allergies  Patient Measurements: Height: 5\' 11"  (180.3 cm) Weight: 81.1 kg (178 lb 12.7 oz) IBW/kg (Calculated) : 75.3 TPN AdjBW (KG): 82.4 Body mass index is 24.94 kg/m.   Vital signs: Temp: 96.7 F (35.9 C) (12/10 0400) Temp Source: Axillary (12/10 0400) BP: 161/71 (12/10 0700) Pulse Rate: 50 (12/10 0729)  Labs: No results found for: "ALBUMIN", "PHENYTOIN", "PHENYTFREE"  Lab Results  Component Value Date   PHENYTOIN 13.9 01/28/2023   Estimated Creatinine Clearance: 86.1 mL/min (by C-G formula based on SCr of 0.85 mg/dL).    Assessment: 72 yo M with status epilepticus. Pt is on keppra and phenytoin. S/p versed infusion and phenobarbital taper. Pharmacy consulted for phenytoin dosing for seizure control. Pt received fosphenytoin 1500mg  IV x1 as loading dose on 12/5 at 1238. Post load level: 22.4 mcg/mL (corrected for hypoalbuminemia). Now on phenytoin 100mg  IV q 8h for maintenance control.   Albumin 2.3 (02/02/23) Scr 1.00, CrCl 73.2 ml/min today   Corrected phenytoin level: 21.25 mcg/mL - supratherapeutic  Significant potential drug interactions: s/p phenobarbital taper for alcohol withdrawal + seizures (12/4-12/8, long half life)   Goals of care:  Total phenytoin level: 10-20 mcg/ml Free phenytoin level: 1-2 mcg/ml (send-out)   Plan:  Reduce dose to phenytoin 75mg  IV q8h  F/u any further drug interactions, renal function, albumin  Pharmacy will continue to follow regarding obtaining total phenytoin levels and dose adjustments as indicated.  Calton Dach, PharmD, BCCCP Clinical Pharmacist 02/02/2023 8:36 AM

## 2023-02-02 NOTE — Progress Notes (Signed)
eLink Physician-Brief Progress Note Patient Name: John Spears DOB: [birth year] MRN: [id number]   Date of Service  02/02/2023  HPI/Events of Note  Restraints renewed for patient safety.  eICU Interventions          Migdalia Dk 02/02/2023, 7:28 PM

## 2023-02-03 DIAGNOSIS — J9601 Acute respiratory failure with hypoxia: Secondary | ICD-10-CM | POA: Diagnosis not present

## 2023-02-03 DIAGNOSIS — G9341 Metabolic encephalopathy: Secondary | ICD-10-CM | POA: Diagnosis not present

## 2023-02-03 DIAGNOSIS — I509 Heart failure, unspecified: Secondary | ICD-10-CM

## 2023-02-03 LAB — BASIC METABOLIC PANEL
Anion gap: 9 (ref 5–15)
BUN: 29 mg/dL — ABNORMAL HIGH (ref 8–23)
CO2: 24 mmol/L (ref 22–32)
Calcium: 9.5 mg/dL (ref 8.9–10.3)
Chloride: 110 mmol/L (ref 98–111)
Creatinine, Ser: 0.9 mg/dL (ref 0.61–1.24)
GFR, Estimated: >60 mL/min (ref >60)
Glucose, Bld: 86 mg/dL (ref 70–99)
Potassium: 3.7 mmol/L (ref 3.5–5.1)
Sodium: 143 mmol/L (ref 135–145)

## 2023-02-03 LAB — GLUCOSE, CAPILLARY
Glucose-Capillary: 115 mg/dL — ABNORMAL HIGH (ref 70–99)
Glucose-Capillary: 144 mg/dL — ABNORMAL HIGH (ref 70–99)
Glucose-Capillary: 64 mg/dL — ABNORMAL LOW (ref 70–99)
Glucose-Capillary: 89 mg/dL (ref 70–99)
Glucose-Capillary: 91 mg/dL (ref 70–99)
Glucose-Capillary: 98 mg/dL (ref 70–99)
Glucose-Capillary: 99 mg/dL (ref 70–99)

## 2023-02-03 LAB — CBC
HCT: 29 % — ABNORMAL LOW (ref 39.0–52.0)
Hemoglobin: 9.4 g/dL — ABNORMAL LOW (ref 13.0–17.0)
MCH: 29.4 pg (ref 26.0–34.0)
MCHC: 32.4 g/dL (ref 30.0–36.0)
MCV: 90.6 fL (ref 80.0–100.0)
Platelets: 120 10*3/uL — ABNORMAL LOW (ref 150–400)
RBC: 3.2 MIL/uL — ABNORMAL LOW (ref 4.22–5.81)
RDW: 14.3 % (ref 11.5–15.5)
WBC: 5.6 10*3/uL (ref 4.0–10.5)
nRBC: 0 % (ref 0.0–0.2)

## 2023-02-03 MED ORDER — DOCUSATE SODIUM 50 MG/5ML PO LIQD: 100.0000 mg | Freq: Two times a day (BID) | ORAL | Status: DC | PRN

## 2023-02-03 MED ORDER — FOLIC ACID 1 MG PO TABS
1.0000 mg | ORAL_TABLET | Freq: Every day | ORAL | Status: DC
  Administered 2023-02-04 – 2023-02-10 (×7): 1 mg via ORAL
  Filled 2023-02-03 (×7): qty 1

## 2023-02-03 MED ORDER — DEXTROSE 50 % IV SOLN
12.5000 g | INTRAVENOUS | Status: AC
Start: 2023-02-03 — End: 2023-02-03
  Administered 2023-02-03: 12.5 g via INTRAVENOUS
  Filled 2023-02-03: qty 50

## 2023-02-03 MED ORDER — METOPROLOL TARTRATE 5 MG/5ML IV SOLN
5.0000 mg | INTRAVENOUS | Status: DC | PRN
  Administered 2023-02-03: 5 mg via INTRAVENOUS

## 2023-02-03 MED ORDER — ADULT MULTIVITAMIN W/MINERALS CH
1.0000 | ORAL_TABLET | Freq: Every day | ORAL | Status: DC
  Administered 2023-02-04 – 2023-02-10 (×7): 1 via ORAL
  Filled 2023-02-03 (×7): qty 1

## 2023-02-03 MED ORDER — METOPROLOL TARTRATE 5 MG/5ML IV SOLN: 2.5000 mg | INTRAVENOUS | Status: DC | PRN

## 2023-02-03 MED ORDER — AMLODIPINE BESYLATE 5 MG PO TABS
10.0000 mg | ORAL_TABLET | Freq: Every day | ORAL | Status: DC
  Administered 2023-02-04 – 2023-02-10 (×7): 10 mg via ORAL
  Filled 2023-02-03 (×2): qty 2
  Filled 2023-02-03: qty 1
  Filled 2023-02-03 (×4): qty 2

## 2023-02-03 MED ORDER — FAMOTIDINE 20 MG PO TABS
20.0000 mg | ORAL_TABLET | Freq: Two times a day (BID) | ORAL | Status: DC
  Administered 2023-02-03: 20 mg via ORAL
  Filled 2023-02-03: qty 1

## 2023-02-03 MED ORDER — FUROSEMIDE 10 MG/ML IJ SOLN
80.0000 mg | Freq: Once | INTRAMUSCULAR | Status: AC
  Administered 2023-02-03: 80 mg via INTRAVENOUS
  Filled 2023-02-03: qty 8

## 2023-02-03 MED ORDER — ORAL CARE MOUTH RINSE
15.0000 mL | OROMUCOSAL | Status: DC
  Administered 2023-02-03 – 2023-02-10 (×28): 15 mL via OROMUCOSAL

## 2023-02-03 MED ORDER — ATORVASTATIN CALCIUM 40 MG PO TABS
40.0000 mg | ORAL_TABLET | Freq: Every day | ORAL | Status: DC
Start: 2023-02-04 — End: 2023-02-10
  Administered 2023-02-04 – 2023-02-10 (×7): 40 mg via ORAL
  Filled 2023-02-03 (×7): qty 1

## 2023-02-03 MED ORDER — POTASSIUM CHLORIDE 20 MEQ PO PACK
20.0000 meq | PACK | Freq: Once | ORAL | Status: AC
  Administered 2023-02-03: 20 meq
  Filled 2023-02-03: qty 1

## 2023-02-03 MED ORDER — METOPROLOL TARTRATE 5 MG/5ML IV SOLN
2.5000 mg | INTRAVENOUS | Status: DC | PRN
  Administered 2023-02-03 – 2023-02-04 (×2): 5 mg via INTRAVENOUS
  Administered 2023-02-05: 2.5 mg via INTRAVENOUS
  Filled 2023-02-03 (×4): qty 5

## 2023-02-03 MED ORDER — METOPROLOL TARTRATE 5 MG/5ML IV SOLN
INTRAVENOUS | Status: AC
  Filled 2023-02-03: qty 5

## 2023-02-03 MED ORDER — MAGNESIUM SULFATE 2 GM/50ML IV SOLN
2.0000 g | Freq: Once | INTRAVENOUS | Status: AC
  Administered 2023-02-03: 2 g via INTRAVENOUS
  Filled 2023-02-03: qty 50

## 2023-02-03 MED ORDER — POTASSIUM CHLORIDE CRYS ER 20 MEQ PO TBCR
40.0000 meq | EXTENDED_RELEASE_TABLET | Freq: Once | ORAL | Status: AC
  Administered 2023-02-03: 40 meq via ORAL
  Filled 2023-02-03: qty 2

## 2023-02-03 MED ORDER — ORAL CARE MOUTH RINSE: 15.0000 mL | OROMUCOSAL | Status: DC | PRN

## 2023-02-03 MED ORDER — POLYETHYLENE GLYCOL 3350 17 G PO PACK: 17.0000 g | PACK | Freq: Every day | ORAL | Status: DC | PRN

## 2023-02-03 MED ORDER — TRAMADOL HCL 50 MG PO TABS
50.0000 mg | ORAL_TABLET | Freq: Four times a day (QID) | ORAL | Status: DC | PRN
  Administered 2023-02-03 – 2023-02-06 (×4): 50 mg via ORAL
  Filled 2023-02-03 (×5): qty 1

## 2023-02-03 NOTE — Progress Notes (Signed)
SLP Cancellation Note  Patient Details Name: John Spears MRN: [id number] DOB: 1950-05-26   Cancelled treatment:       Reason Eval/Treat Not Completed: Other (comment) SLP spoke with patient's RN who reported that she performed a second [facility] swallow screen this afternoon (patient failed first one this morning) and he passed so MD is starting patient on a PO diet and plans to cancel SLP swallow evaluation orders. Please reorder if needed.   Angela Nevin, MA, CCC-SLP Speech Therapy

## 2023-02-03 NOTE — Progress Notes (Signed)
02/03/2023 Having some asymptomatic runs of afib/rvr.  Give some mag, additional potassium.  Can try PRN lopressor, has good BP for it.  Myrla Halsted MD PCCM

## 2023-02-03 NOTE — TOC CM/SW Note (Addendum)
Transition of Care [facility]) - Inpatient Brief Assessment   Patient Details  Name: John Spears MRN: [id number] Date of Birth: 11/29/1950  [facility] CM/SW Contact:    Mearl Latin, LCSW Phone Number: 02/03/2023, 9:27 AM   Clinical Narrative: Patient remains intubated. TOC following.    Transition of Care Asessment: Insurance and Status: Insurance coverage has been reviewed Patient has primary care physician: No Home environment has been reviewed: From home Prior level of function:: Independent Prior/Current Home Services: No current home services Social Determinants of Health Reivew: SDOH reviewed no interventions necessary Readmission risk has been reviewed: Yes Transition of care needs: transition of care needs identified, TOC will continue to follow

## 2023-02-03 NOTE — Procedures (Signed)
Extubation Procedure Note  Patient Details:   Name: John Spears DOB: 02/22/1951 MRN: [id number]   Airway Documentation:    Vent end date: 02/03/23 Vent end time: 1115   Evaluation  O2 sats: stable throughout Complications: No apparent complications Patient did tolerate procedure well. Bilateral Breath Sounds: Clear, Diminished   Yes  Pt extubated per MD order, placed on 3L [place]. Positive cuff leak noted, no stridor heard. RT will continue to monitor.   Vicente Masson 02/03/2023, 11:19 AM

## 2023-02-03 NOTE — Progress Notes (Signed)
? ?  Inpatient Rehab Admissions Coordinator : ? ?Per therapy recommendations, patient was screened for CIR candidacy by Blue Ruggerio RN MSN.  At this time patient appears to be a potential candidate for CIR. I will place a rehab consult per protocol for full assessment. Please call me with any questions. ? ?Jerriyah Louis RN MSN ?Admissions Coordinator ?[phone] ?  ?

## 2023-02-03 NOTE — Evaluation (Signed)
Physical Therapy Evaluation Patient Details Name: John Spears MRN: [id number] DOB: 1950/08/22 Today's Date: 02/03/2023  History of Present Illness  Pt is a 72 y.o. male who presented 01/26/23 with seizure activity. Code stroke was activated for unresponsiveness after intubation 12/3. CT head negative. Extubated 12/11. PMH seizures on keppra, HTN, L MCA, heart failure, alcohol abuse   Clinical Impression  Pt presents with condition above and deficits mentioned below, see PT Problem List. Pt appears to have been likely independent PTA based on chart review. Currently, pt is demonstrating deficits in cognition, gross strength, balance, power, and activity tolerance. He requires modAx1-2 for bed mobility, modA to transfer to stand, and maxA to step along EOB a couple times with HHA. He is at high risk for falls and injuries and could greatly benefit from intensive inpatient rehab, > 3 hours/day, to maximize his return to baseline prior to return home. Will continue to follow acutely.        If plan is discharge home, recommend the following: Two people to help with walking and/or transfers;A lot of help with bathing/dressing/bathroom;Assistance with cooking/housework;Direct supervision/assist for medications management;Direct supervision/assist for financial management;Assist for transportation;Help with stairs or ramp for entrance   Can travel by private vehicle        Equipment Recommendations Other (comment) (TBA)  Recommendations for Other Services  Rehab consult;OT consult    Functional Status Assessment Patient has had a recent decline in their functional status and demonstrates the ability to make significant improvements in function in a reasonable and predictable amount of time.     Precautions / Restrictions Precautions Precautions: Fall Precaution Comments: siezures Restrictions Weight Bearing Restrictions: No      Mobility  Bed Mobility Overal bed mobility: Needs  Assistance Bed Mobility: Supine to Sit, Sit to Supine     Supine to sit: HOB elevated, Mod assist Sit to supine: Mod assist, +2 for physical assistance, +2 for safety/equipment   General bed mobility comments: Pt needed cues and assistance to bring bil legs off EOB and ascend trunk, modA. ModAx2 needed to direct trunk and lifts legs for return to supine.    Transfers Overall transfer level: Needs assistance Equipment used: 1 person hand held assist Transfers: Sit to/from Stand Sit to Stand: Mod assist           General transfer comment: ModA needed to shift pt's weight anteriorly, assist pt in powering  up to stand, and gaining balance with anterior approach and pt holding onto therapist for support.    Ambulation/Gait Ambulation/Gait assistance: Max assist Gait Distance (Feet): 1 Feet Assistive device: 1 person hand held assist Gait Pattern/deviations: Step-to pattern, Decreased step length - left, Decreased step length - right, Decreased stride length, Decreased weight shift to right, Decreased weight shift to left, Trunk flexed, Narrow base of support, Leaning posteriorly Gait velocity: reduced Gait velocity interpretation: <1.31 ft/sec, indicative of household ambulator   General Gait Details: Pt takes slow, small, narrow steps along EOB, needing extensive cues for weight shifting to advance each foot. Pt not clearing feet when stepping. Posterior lean noted.  Stairs            Wheelchair Mobility     Tilt Bed    Modified Rankin (Stroke Patients Only) Modified Rankin (Stroke Patients Only) Pre-Morbid Rankin Score: No symptoms Modified Rankin: Moderately severe disability     Balance Overall balance assessment: Needs assistance Sitting-balance support: Single extremity supported, Feet supported, Bilateral upper extremity supported Sitting balance-Leahy Scale: Poor Sitting  balance - Comments: UE support and CGA for safety sitting statically EOB   Standing  balance support: Bilateral upper extremity supported, During functional activity, Reliant on assistive device for balance Standing balance-Leahy Scale: Poor Standing balance comment: Reliant on UE support and external physical assistance                             Pertinent Vitals/Pain Pain Assessment Pain Assessment: Faces Faces Pain Scale: Hurts little more Pain Location: headache Pain Descriptors / Indicators: Headache, Discomfort Pain Intervention(s): Limited activity within patient's tolerance, Monitored during session, Repositioned    Home Living Family/patient expects to be discharged to:: Private residence Living Arrangements: Other relatives (sister) Available Help at Discharge: Family               Additional Comments: Will need to confirm info in future sessions, pt is questionable historian this date    Prior Function Prior Level of Function : Independent/Modified Independent (assumed based on chart reporting pt was making his own bed when his sister heard him fall)                     Extremity/Trunk Assessment   Upper Extremity Assessment Upper Extremity Assessment: Defer to OT evaluation    Lower Extremity Assessment Lower Extremity Assessment: Generalized weakness (L potentially slightly weaker than R?; denied sensory changes bil)    Cervical / Trunk Assessment Cervical / Trunk Assessment: Normal  Communication   Communication Communication: Difficulty following commands/understanding;Other (comment) (soft-spoken) Following commands: Follows one step commands consistently;Follows one step commands with increased time  Cognition Arousal: Alert Behavior During Therapy: Flat affect Overall Cognitive Status: Impaired/Different from baseline Area of Impairment: Orientation, Attention, Following commands, Safety/judgement, Awareness, Problem solving                 Orientation Level: Disoriented to, Situation (did not ask  time) Current Attention Level: Sustained   Following Commands: Follows one step commands consistently, Follows one step commands with increased time Safety/Judgement: Decreased awareness of safety, Decreased awareness of deficits Awareness: Emergent Problem Solving: Slow processing, Difficulty sequencing, Requires verbal cues, Requires tactile cues, Decreased initiation General Comments: Pt displays poor initiation to bring emesis bag to his mouth when he needs to spit, instead just leaning his head towards it in bed and not using his arms even though he can move them. Pt aware of location but not situation. Slow to proccess cues and needs multi-modal cues to sequence mobility        General Comments      Exercises     Assessment/Plan    PT Assessment Patient needs continued PT services  PT Problem List Decreased strength;Decreased activity tolerance;Decreased balance;Decreased mobility;Decreased cognition;Decreased coordination       PT Treatment Interventions DME instruction;Gait training;Stair training;Functional mobility training;Therapeutic activities;Therapeutic exercise;Balance training;Neuromuscular re-education;Cognitive remediation;Patient/family education    PT Goals (Current goals can be found in the Care Plan section)  Acute Rehab PT Goals Patient Stated Goal: to reduce his headache pain PT Goal Formulation: With patient Time For Goal Achievement: 02/17/23 Potential to Achieve Goals: Good    Frequency Min 1X/week     Co-evaluation               AM-PAC PT "6 Clicks" Mobility  Outcome Measure Help needed turning from your back to your side while in a flat bed without using bedrails?: A Little Help needed moving from lying on your back to  sitting on the side of a flat bed without using bedrails?: A Lot Help needed moving to and from a bed to a chair (including a wheelchair)?: A Lot Help needed standing up from a chair using your arms (e.g., wheelchair or  bedside chair)?: A Lot Help needed to walk in hospital room?: Total Help needed climbing 3-5 steps with a railing? : Total 6 Click Score: 11    End of Session Equipment Utilized During Treatment: Gait belt Activity Tolerance: Patient tolerated treatment well;Patient limited by pain (headache) Patient left: in bed;with call bell/phone within reach;with bed alarm set;with nursing/sitter in room Nurse Communication: Mobility status PT Visit Diagnosis: Unsteadiness on feet (R26.81);Other abnormalities of gait and mobility (R26.89);Muscle weakness (generalized) (M62.81);Difficulty in walking, not elsewhere classified (R26.2);Other symptoms and signs involving the nervous system (R29.898)    Time: 1610-9604 PT Time Calculation (min) (ACUTE ONLY): 15 min   Charges:   PT Evaluation $PT Eval Moderate Complexity: 1 Mod   PT General Charges $$ ACUTE PT VISIT: 1 Visit         Virgil Benedict, PT, DPT Acute Rehabilitation Services  Office: [phone]   Bettina Gavia 02/03/2023, 4:03 PM

## 2023-02-03 NOTE — Progress Notes (Signed)
NAME:  John Spears, MRN:  [id number], DOB:  [birth year], LOS: 8 ADMISSION DATE:  01/26/2023, CONSULTATION DATE:  01/26/2023 REFERRING MD:  Andria Meuse, EDP, CHIEF COMPLAINT:  seizure   History of Present Illness:  72 year old male with past medical history of heart failure with recovered EF, MCA stroke, hypertension, alcohol use disorder, seizure disorder who presented to the ED via EMS with seizure, altered mental status. Patient unable to participate in history, therefore HPI obtained from ED, neurology notes. Per chart review, patient was up this AM making his bed when sister, Kathie Rhodes, heard a fall/loud noise around 0830. She found patient lying on the floor and called 911 with concern of seizure activity. Notes he has been compliant with AEDs, unclear on his alcohol use, and eating "okay." Per ED triage notes, EMS noted seizure activity, right tremor, leftward gaze. He was given 5mg  versed which did not break seizure activity, and given subsequent 2.5mg  with abatement. He was intubated immediately upon arrival. He was called a code stroke and taken to CT with no evidence of bleed. He was loaded with keppra and started on propofol/fentanyl and admitted to [facility].   Pertinent  Medical History  heart failure with recovered EF, MCA stroke, hypertension, alcohol use disorder, seizure disorder  Significant Hospital Events: Including procedures, antibiotic start and stop dates in addition to other pertinent events   12/3 - Admitted to ICU after seizure, altered mental status. CT head negative. Seen by neuro. LTM started.  12/4 - Heavily sedated no obvious seizure. Prop was weaned off. Then versed. Had break thru seizure w/ worse exam. Got bolus of versed f/b resuming gtt and also started pheno load and taper 12/5 - On a little higher versed than day prior. Still on LTM. Exam perhaps a little better BUT still having breakthrough seizures, Versed increased. 12/6 - No seizures overnight, Versed weaned to  5 12/9 tried PSV but failed due to tachypnea, follows commands but drowsy, on vent  Interim History / Subjective:   Doing better, extubated, sat up in chair for a bit  Objective:  Blood pressure (!) 186/76, pulse 87, temperature 98 F (36.7 C), temperature source Oral, resp. rate (!) 24, height 5\' 11"  (1.803 m), weight 80 kg, SpO2 100%.    Vent Mode: PSV;CPAP FiO2 (%):  [30 %] 30 % Set Rate:  [12 bmp] 12 bmp Vt Set:  [600 mL-667 mL] 667 mL PEEP:  [5 cmH20] 5 cmH20 Pressure Support:  [5 cmH20] 5 cmH20 Plateau Pressure:  [16 cmH20-36 cmH20] 36 cmH20   Intake/Output Summary (Last 24 hours) at 02/03/2023 1454 Last data filed at 02/03/2023 1051 Gross per 24 hour  Intake 1728.17 ml  Output 335 ml  Net 1393.17 ml   Filed Weights   01/30/23 0500 02/02/23 0708 02/03/23 0707  Weight: 81.1 kg 81 kg 80 kg   Physical Examination: General: Acutely ill-appearing older man in NAD. Intubated, off sedation HEENT: [place]/AT, anicteric sclera, PERRL, moist mucous membranes. Neuro: Eyes open, follows commands, tracks, drowsy CV: RRR, no m/g/r. PULM: No secretions, no accessory muscle use, bilateral ventilated breath sounds GI: Soft, nontender, nondistended. Hypoactive bowel sounds. Extremities: Trace bilateral symmetric LE edema noted. Skin: Warm/dry, no rashes.  Labs and images reviewed  Resolved Hospital Problem List:     Assessment & Plan:  Acute metabolic encephalopathy s/p Break thru Seizures w/ remote h/o stroke exacerbated by ETOH and prob ETOH wd seizure and cont to have subclinical seizures per LTM on 12/5 - Neuro following, appreciate recommendations -  LTM EEG per neuro >> no seizures seen 12/8 and 12/9, removed 12/10 - AEDs per Neuro (S/p fosphenytoin; continue Keppra, phenytoin), Versed gtt weaned off - Seizure precautions - Neuroprotective measures: HOB > 30 degrees, normoglycemia, normothermia, electrolytes WNL  Acute respiratory failure due to acute encephalopathy and  seizures c/b aspiration PNA  - extubated, 12/11 - Wean O2 as tolerated - Unasyn x 5-day course, end 12/7 - saline nebs with tracheobronchial toilet  Heart failure; most recent echo 06/2022 with EF 60-65%, mild LVH, G1DD  - Coreg on hold due to bradycardia - Continue lasix, lack of robust outout 40 mg IV 12/10, give 80 mg IV 12/11  AKI, resolved Hypophosphatemia, improved  Hypernatremia: Improved. -FWF  Hyperlipidemia and h/o Hypertension  - Continue statin - amlodipine, holding Coreg with bradycardia  Alcohol use disorder; ethanol negative in ED. Unclear last drink  - out of window for withdrawal  Best Practice (right click and "Reselect all SmartList Selections" daily)   Diet/type: tubefeeds DVT prophylaxis: LMWH Pressure ulcer(s). pressure injury stage n/a was NOT present on admission GI prophylaxis: H2B Lines: N/A Foley:  Yes, and it is still needed Code Status:   full code Last date of multidisciplinary goals of care discussion - n/a  Critical care time:    CRITICAL CARE Performed by: Karren Burly   Total critical care time: 31 minutes  Critical care time was exclusive of separately billable procedures and treating other patients.  Critical care was necessary to treat or prevent imminent or life-threatening deterioration.  Critical care was time spent personally by me on the following activities: development of treatment plan with patient and/or surrogate as well as nursing, discussions with consultants, evaluation of patient's response to treatment, examination of patient, obtaining history from patient or surrogate, ordering and performing treatments and interventions, ordering and review of laboratory studies, ordering and review of radiographic studies, pulse oximetry and re-evaluation of patient's condition.   Karren Burly, MD [facility] 02/03/23 2:54 PM  Please see [email or web address] for pager details.  From 7A-7P if no  response, please call [phone] After hours, please call ELink [phone]

## 2023-02-04 DIAGNOSIS — I509 Heart failure, unspecified: Secondary | ICD-10-CM | POA: Diagnosis not present

## 2023-02-04 DIAGNOSIS — R471 Dysarthria and anarthria: Secondary | ICD-10-CM | POA: Diagnosis not present

## 2023-02-04 DIAGNOSIS — G9341 Metabolic encephalopathy: Secondary | ICD-10-CM | POA: Diagnosis not present

## 2023-02-04 DIAGNOSIS — R4182 Altered mental status, unspecified: Secondary | ICD-10-CM | POA: Diagnosis not present

## 2023-02-04 DIAGNOSIS — G40909 Epilepsy, unspecified, not intractable, without status epilepticus: Secondary | ICD-10-CM | POA: Diagnosis not present

## 2023-02-04 LAB — CBC
HCT: 32.8 % — ABNORMAL LOW (ref 39.0–52.0)
Hemoglobin: 10.6 g/dL — ABNORMAL LOW (ref 13.0–17.0)
MCH: 29 pg (ref 26.0–34.0)
MCHC: 32.3 g/dL (ref 30.0–36.0)
MCV: 89.6 fL (ref 80.0–100.0)
Platelets: 204 10*3/uL (ref 150–400)
RBC: 3.66 MIL/uL — ABNORMAL LOW (ref 4.22–5.81)
RDW: 14.4 % (ref 11.5–15.5)
WBC: 9.2 10*3/uL (ref 4.0–10.5)
nRBC: 0 % (ref 0.0–0.2)

## 2023-02-04 LAB — BASIC METABOLIC PANEL
Anion gap: 10 (ref 5–15)
BUN: 25 mg/dL — ABNORMAL HIGH (ref 8–23)
CO2: 23 mmol/L (ref 22–32)
Calcium: 9.2 mg/dL (ref 8.9–10.3)
Chloride: 108 mmol/L (ref 98–111)
Creatinine, Ser: 1.14 mg/dL (ref 0.61–1.24)
GFR, Estimated: >60 mL/min (ref >60)
Glucose, Bld: 99 mg/dL (ref 70–99)
Potassium: 4.5 mmol/L (ref 3.5–5.1)
Sodium: 141 mmol/L (ref 135–145)

## 2023-02-04 LAB — CULTURE, BLOOD (ROUTINE X 2)
Culture: NO GROWTH
Culture: NO GROWTH

## 2023-02-04 LAB — GLUCOSE, CAPILLARY
Glucose-Capillary: 93 mg/dL (ref 70–99)
Glucose-Capillary: 80 mg/dL (ref 70–99)
Glucose-Capillary: 103 mg/dL — ABNORMAL HIGH (ref 70–99)
Glucose-Capillary: 108 mg/dL — ABNORMAL HIGH (ref 70–99)

## 2023-02-04 MED ORDER — CARVEDILOL 12.5 MG PO TABS
12.5000 mg | ORAL_TABLET | Freq: Two times a day (BID) | ORAL | Status: DC
  Administered 2023-02-04 (×2): 12.5 mg via ORAL
  Filled 2023-02-04 (×2): qty 1

## 2023-02-04 MED ORDER — LEVETIRACETAM 500 MG PO TABS
1000.0000 mg | ORAL_TABLET | Freq: Two times a day (BID) | ORAL | Status: DC
  Administered 2023-02-04 – 2023-02-05 (×2): 1000 mg via ORAL
  Filled 2023-02-04 (×2): qty 2

## 2023-02-04 MED ORDER — TAMSULOSIN HCL 0.4 MG PO CAPS
0.4000 mg | ORAL_CAPSULE | Freq: Every day | ORAL | Status: DC
  Administered 2023-02-04 – 2023-02-10 (×7): 0.4 mg via ORAL
  Filled 2023-02-04 (×7): qty 1

## 2023-02-04 MED ORDER — PROSOURCE PLUS PO LIQD
30.0000 mL | Freq: Three times a day (TID) | ORAL | Status: DC
Start: 2023-02-04 — End: 2023-02-10
  Administered 2023-02-04 – 2023-02-10 (×17): 30 mL via ORAL
  Filled 2023-02-04 (×19): qty 30

## 2023-02-04 MED ORDER — ENSURE ENLIVE PO LIQD
237.0000 mL | Freq: Three times a day (TID) | ORAL | Status: DC
  Administered 2023-02-04 – 2023-02-10 (×16): 237 mL via ORAL

## 2023-02-04 MED ORDER — FUROSEMIDE 10 MG/ML IJ SOLN
80.0000 mg | Freq: Once | INTRAMUSCULAR | Status: AC
  Administered 2023-02-04: 80 mg via INTRAVENOUS
  Filled 2023-02-04: qty 8

## 2023-02-04 NOTE — Progress Notes (Signed)
NEUROLOGY CONSULT FOLLOW UP NOTE   Date of service: February 04, 2023 Patient Name: John Spears MRN:  [id number] DOB:  [birth year]  Brief HPI  Marques Luthman is a 72 year old male with past medical history ETOH abuse,  HF with improved EF, MCA stroke, HTN, seizure disorder who presented with  AMS and breakthrough seizure on 12/3.   Interval Hx/subjective  No seizure activity reported overnight. Remains intubated, did not tolerate SBT this morning due to inability to follow commands; precedex now turned off per RN with possible additional SBT later in the day. No family at bedside.   Opens eyes, follows commands. Some agitation noted but not much.  LTM discontinued 12/09. Extubated 12/11  Vitals   Vitals:   02/04/23 0700 02/04/23 0800 02/04/23 0900 02/04/23 1000  BP: (!) 149/74 (!) 151/85 (!) 144/92 (!) 154/80  Pulse: (!) 102 100 95 88  Resp: (!) 26 19 (!) 27 16  Temp:  99.6 F (37.6 C)    TempSrc:  Axillary    SpO2: 100%  93% 99%  Weight:      Height:         Body mass index is 24.6 kg/m.  Physical Exam   Constitutional: Elderly gentleman, appears stated age. Intubated, in no acute distress.    Neurologic Examination   He is awake, able to tell me that he is in the hospital but does not know which one.  He speaks very softly so it is difficult to hear and understand him, but when encouraged to repeat and when leaning close to him, I am able to understand the majority of what he says.  Tells me the month is January, unable to tell me the year. He follows commands on the left better than right, but does move both arms and legs.   Medications  Current Facility-Administered Medications:    acetaminophen (TYLENOL) 160 MG/5ML solution 650 mg, 650 mg, Oral, Q6H PRN, Gaetana Michaelis, MD, 650 mg at 02/03/23 1140   amLODipine (NORVASC) tablet 10 mg, 10 mg, Oral, Daily, Hunsucker, Lesia Sago, MD   atorvastatin (LIPITOR) tablet 40 mg, 40 mg, Oral, Daily, Hunsucker,  Lesia Sago, MD   carvedilol (COREG) tablet 12.5 mg, 12.5 mg, Oral, BID WC, Hunsucker, Lesia Sago, MD, 12.5 mg at 02/04/23 1914   Chlorhexidine Gluconate Cloth 2 % PADS 6 each, 6 each, Topical, Daily, Oretha Milch, MD, 6 each at 02/03/23 0818   docusate (COLACE) 50 MG/5ML liquid 100 mg, 100 mg, Oral, BID PRN, Hunsucker, Lesia Sago, MD   enoxaparin (LOVENOX) injection 40 mg, 40 mg, Subcutaneous, Q24H, Autry, Lauren E, PA-C, 40 mg at 02/03/23 1141   folic acid (FOLVITE) tablet 1 mg, 1 mg, Oral, Daily, Hunsucker, Lesia Sago, MD   hydrALAZINE (APRESOLINE) injection 10 mg, 10 mg, Intravenous, Q4H PRN, Gevena Mart A, NP, 10 mg at 02/03/23 1407   levETIRAcetam (KEPPRA) tablet 1,000 mg, 1,000 mg, Oral, BID, Rejeana Brock, MD   metoprolol tartrate (LOPRESSOR) injection 2.5-5 mg, 2.5-5 mg, Intravenous, Q3H PRN, Migdalia Dk, MD, 5 mg at 02/03/23 2155   multivitamin with minerals tablet 1 tablet, 1 tablet, Oral, Daily, Hunsucker, Lesia Sago, MD   Oral care mouth rinse, 15 mL, Mouth Rinse, 4 times per day, Hunsucker, Lesia Sago, MD, 15 mL at 02/04/23 7829   Oral care mouth rinse, 15 mL, Mouth Rinse, PRN, Hunsucker, Lesia Sago, MD   phenytoin (DILANTIN) injection 75 mg, 75 mg, Intravenous, Q8H, Calton Dach I,  RPH, 75 mg at 02/04/23 0555   polyethylene glycol (MIRALAX / GLYCOLAX) packet 17 g, 17 g, Oral, Daily PRN, Hunsucker, Lesia Sago, MD   tamsulosin (FLOMAX) capsule 0.4 mg, 0.4 mg, Oral, QPC breakfast, Hunsucker, Lesia Sago, MD   [COMPLETED] thiamine (VITAMIN B1) 500 mg in sodium chloride 0.9 % 50 mL IVPB, 500 mg, Intravenous, Q8H, Stopped at 01/29/23 0734 **FOLLOWED BY** [COMPLETED] thiamine (VITAMIN B1) 250 mg in sodium chloride 0.9 % 50 mL IVPB, 250 mg, Intravenous, Daily, Stopped at 02/03/23 1137 **FOLLOWED BY** thiamine (VITAMIN B1) injection 100 mg, 100 mg, Intravenous, Daily, Bhagat, Srishti L, MD   traMADol (ULTRAM) tablet 50 mg, 50 mg, Oral, Q6H PRN, Hunsucker, Lesia Sago, MD, 50 mg at  02/03/23 1531 Labs and Diagnostic Imaging   CBC:  Recent Labs  Lab 01/31/23 0520 02/01/23 0425 02/03/23 0504 02/04/23 0445  WBC 11.9* 9.2 5.6 9.2  NEUTROABS 7.7 5.7  --   --   HGB 9.7* 9.5* 9.4* 10.6*  HCT 30.9* 30.3* 29.0* 32.8*  MCV 92.8 92.9 90.6 89.6  PLT 110* 132* 120* 204    Basic Metabolic Panel:  Lab Results  Component Value Date   NA 141 02/04/2023   K 4.5 02/04/2023   CO2 23 02/04/2023   GLUCOSE 99 02/04/2023   BUN 25 (H) 02/04/2023   CREATININE 1.14 02/04/2023   CALCIUM 9.2 02/04/2023   GFRNONAA >60 02/04/2023   GFRAA >60 04/04/2019   Lipid Panel:  Lab Results  Component Value Date   LDLCALC 133 (H) 03/08/2019   HgbA1c:  Lab Results  Component Value Date   HGBA1C 5.3 01/26/2023   Urine Drug Screen:     Component Value Date/Time   LABOPIA NONE DETECTED 01/27/2023 1800   COCAINSCRNUR NONE DETECTED 01/27/2023 1800   COCAINSCRNUR NONE DETECTED 02/20/2022 1652   LABBENZ POSITIVE (A) 01/27/2023 1800   AMPHETMU NONE DETECTED 01/27/2023 1800   THCU NONE DETECTED 01/27/2023 1800   LABBARB POSITIVE (A) 01/27/2023 1800    Alcohol Level     Component Value Date/Time   ETH <10 01/26/2023 0954   INR  Lab Results  Component Value Date   INR 1.1 01/26/2023   APTT  Lab Results  Component Value Date   APTT 24 01/26/2023   AED levels:  Lab Results  Component Value Date   PHENYTOIN 11.9 02/02/2023    CT Head without contrast(Personally reviewed): 01/26/2023: No hemorrhage or CT evidence of an acute cortical infarct  Encephalomalacia in the left temporal and occipital lobes is unchanged consistent with prior infarct.  cEEG:  01/28/2023 1126 to 01/29/2023 0630  This study initially showed seizures without clinical signs arising from left hemisphere,  26 seizures were noted, lasting about 30-45 seconds each. Patient was loaded with fosphenytoin at 1238 on 01/28/2023. Last seizure was noted on 01/28/2023 at 1250.  EEG also showed cortical dysfunction in  left hemisphere likely secondary to underlying structural abnormality, postictal state. Additionally there was severe diffuse encephalopathy, likely related to sedation.   12/6 - 12/7 This study is suggestive of severe diffuse encephalopathy, likely related to sedation. No seizures or epileptiform discharges were noted.    12/7-12/8 This study showed evidence of epileptogenicity arising from  left hemisphere. Additionally there is cortical dysfunction in left hemisphere likely secondary to underlying structural abnormality/post-ictal state.  Lastly there is moderate diffuse encephalopathy, likely related to sedation. No seizures were noted.    12/8-12/9 This study showed evidence of epileptogenicity arising from  left hemisphere. Additionally there is  cortical dysfunction in left hemisphere likely secondary to underlying structural abnormality/post-ictal state.  Lastly there is moderate diffuse encephalopathy, likely related to sedation. No seizures were noted.  Assessment   Johnson Nellessen is a 72 y.o. male  who presented on 01/26/2023 with right gaze deviation, right-sided jerking and secondary generalization.    Status epilepticus, resolved Alcohol use disorder   Etiology of status epilepticus: Likely breakthrough seizure in setting of alcohol use disorder/possible alcohol withdrawal (EtOH undetectable at admission) in addition to missed Keppra dose on morning of admission.   On reviewing notes from his previous hospitalization, he is having a very similar time course.  He does have some degree of aphasia from his previous stroke at baseline, and I would expect him to gradually improve over time.  Given his missed dose of medication previously, I am not sure he needs phenytoin in addition to Keppra and I will discontinue it at this time.   Recommendations  -Seizure precautions -Continue keppra 1,000 mg IV q12h -Discontinue phenytoin -Neurology will continue to  follow ______________________________________________________________________   Ritta Slot, MD [facility] [phone]  If 7pm- 7am, please page neurology on call as listed in AMION.

## 2023-02-04 NOTE — Evaluation (Signed)
Occupational Therapy Evaluation Patient Details Name: John Spears MRN: [id number] DOB: 1950-05-06 Today's Date: 02/04/2023   History of Present Illness Pt is a 72 y.o. male who presented 01/26/23 with seizure activity. Code stroke was activated for unresponsiveness after intubation 12/3. CT head negative. Extubated 12/11. PMH seizures on keppra, HTN, L MCA, heart failure, alcohol abuse   Clinical Impression   Pt is unreliable historian, but assume independent in ADL and mobility. Today presents with deficits in cognition, weakness, balance, activity tolerance, vision. For safety utilized the [employer] with RN staff to get Patient to chair. He was unable to sustain attention for full visual assement. He is unaware of his secretion management and needed mod A for oral care. Overall he is max A for UB and LB ADL. At this time recommending continued skilled OT in the acute setting as well as afterwards at the rehab >3 hours daily to maximize safety and independence in ADL and functional transfers.        If plan is discharge home, recommend the following: Two people to help with walking and/or transfers;A lot of help with bathing/dressing/bathroom;Assistance with cooking/housework;Direct supervision/assist for medications management;Direct supervision/assist for financial management;Assist for transportation;Help with stairs or ramp for entrance;Supervision due to cognitive status    Functional Status Assessment  Patient has had a recent decline in their functional status and demonstrates the ability to make significant improvements in function in a reasonable and predictable amount of time.  Equipment Recommendations  Other (comment) (defer to next venue of care)    Recommendations for Other Services PT consult;Speech consult     Precautions / Restrictions Precautions Precautions: Fall Precaution Comments: siezures, Bilateral Mitts Restrictions Weight Bearing Restrictions Per Provider  Order: No      Mobility Bed Mobility Overal bed mobility: Needs Assistance Bed Mobility: Rolling Rolling: Mod assist, +2 for physical assistance, +2 for safety/equipment         General bed mobility comments: rolling for lift pad placement    Transfers Overall transfer level: Needs assistance Equipment used: Ambulation equipment used               General transfer comment: maxi sky due to arousal Transfer via Lift Equipment: Maxisky    Balance Overall balance assessment: Needs assistance                                         ADL either performed or assessed with clinical judgement   ADL Overall ADL's : Needs assistance/impaired Eating/Feeding: NPO   Grooming: Maximal assistance;Oral care;Bed level (HOB elevated) Grooming Details (indicate cue type and reason): Pt with no sustained attention to task, can barely get hand to mouth and face today Upper Body Bathing: Maximal assistance   Lower Body Bathing: Maximal assistance   Upper Body Dressing : Maximal assistance   Lower Body Dressing: Maximal assistance   Toilet Transfer: +2 for physical assistance Toilet Transfer Details (indicate cue type and reason): used lift today, for recliner transfer simulating toilet transfer Toileting- Clothing Manipulation and Hygiene: Total assistance Toileting - Clothing Manipulation Details (indicate cue type and reason): Pt with purewick and flexiseal     Functional mobility during ADLs: +2 for physical assistance (lift today) General ADL Comments: decreased cognition, activity tolerance, seeming inattention to R side     Vision Ability to See in Adequate Light: 1 Impaired Vision Assessment?: Vision impaired- to be further tested  in functional context;Yes Eye Alignment: Within Functional Limits Ocular Range of Motion: Impaired-to be further tested in functional context (would not look to Pt's R) Alignment/Gaze Preference: Gaze left Tracking/Visual  Pursuits: Decreased smoothness of horizontal tracking;Impaired - to be further tested in functional context;Requires cues, head turns, or add eye shifts to track Additional Comments: cognition impacting Pt's ability to perform visual assessment     Perception         Praxis         Pertinent Vitals/Pain Pain Assessment Pain Assessment: No/denies pain Faces Pain Scale: No hurt Pain Intervention(s): Monitored during session, Repositioned     Extremity/Trunk Assessment Upper Extremity Assessment Upper Extremity Assessment: Generalized weakness (BUE MMT is 3+/5, stronger away from shoulder than for shoulder FF)   Lower Extremity Assessment Lower Extremity Assessment: Defer to PT evaluation   Cervical / Trunk Assessment Cervical / Trunk Assessment: Normal   Communication Communication Communication: Difficulty following commands/understanding;Other (comment) (soft-spoken) Following commands: Follows one step commands inconsistently;Follows one step commands with increased time Cueing Techniques: Verbal cues;Gestural cues;Tactile cues;Visual cues   Cognition Arousal: Alert Behavior During Therapy: Flat affect Overall Cognitive Status: Impaired/Different from baseline Area of Impairment: Orientation, Attention, Following commands, Safety/judgement, Awareness, Problem solving                 Orientation Level: Disoriented to, Situation Current Attention Level: Sustained   Following Commands: Follows one step commands consistently, Follows one step commands with increased time Safety/Judgement: Decreased awareness of safety, Decreased awareness of deficits Awareness: Emergent Problem Solving: Slow processing, Difficulty sequencing, Requires verbal cues, Requires tactile cues, Decreased initiation General Comments: Pt displays poor initiation he is  Slow to proccess cues and needs multi-modal cues to sequence mobility or functional tasks. Does not sustain attention for  tasks     General Comments  Pt in Bi mitts    Exercises     Shoulder Instructions      Home Living Family/patient expects to be discharged to:: Private residence Living Arrangements: Other relatives (sister) Available Help at Discharge: Family                             Additional Comments: Will need to confirm info in future sessions, pt is unreliable historian this date      Prior Functioning/Environment Prior Level of Function : Independent/Modified Independent (assumed based on chart reporting pt was making his own bed when his sister heard him fall)             Mobility Comments: no AD use ADLs Comments: sister assists with groceries        OT Problem List: Decreased strength;Decreased activity tolerance;Decreased range of motion;Impaired balance (sitting and/or standing);Decreased cognition;Decreased safety awareness;Impaired UE functional use      OT Treatment/Interventions: Self-care/ADL training;Therapeutic exercise;Neuromuscular education;DME and/or AE instruction;Therapeutic activities;Visual/perceptual remediation/compensation;Patient/family education;Balance training    OT Goals(Current goals can be found in the care plan section) Acute Rehab OT Goals Patient Stated Goal: none stated OT Goal Formulation: With patient Time For Goal Achievement: 02/18/23 Potential to Achieve Goals: Good ADL Goals Pt Will Perform Grooming: with supervision;sitting Pt Will Perform Upper Body Dressing: with min assist;sitting Pt Will Perform Lower Body Dressing: with mod assist;sitting/lateral leans Pt Will Transfer to Toilet: with min assist;stand pivot transfer Pt Will Perform Toileting - Clothing Manipulation and hygiene: with mod assist;sit to/from stand Additional ADL Goal #1: Pt will recall 3 energy conservation strategies for ADL with no  cues  OT Frequency: Min 1X/week    Co-evaluation              AM-PAC OT "6 Clicks" Daily Activity     Outcome  Measure Help from another person eating meals?: A Lot Help from another person taking care of personal grooming?: A Lot Help from another person toileting, which includes using toliet, bedpan, or urinal?: A Lot Help from another person bathing (including washing, rinsing, drying)?: A Lot Help from another person to put on and taking off regular upper body clothing?: A Lot Help from another person to put on and taking off regular lower body clothing?: A Lot 6 Click Score: 12   End of Session Equipment Utilized During Treatment: Other (comment) Museum/gallery curator) Nurse Communication: Mobility status;Precautions (needs soft touch call bell)  Activity Tolerance: Patient tolerated treatment well Patient left: in chair;with call bell/phone within reach;with chair alarm set;with restraints reapplied  OT Visit Diagnosis: Unsteadiness on feet (R26.81);Other abnormalities of gait and mobility (R26.89);Muscle weakness (generalized) (M62.81);Low vision, both eyes (H54.2);Other symptoms and signs involving the nervous system (R29.898)                Time: 1610-9604 OT Time Calculation (min): 29 min Charges:  OT General Charges $OT Visit: 1 Visit OT Evaluation $OT Eval Moderate Complexity: 1 Mod OT Treatments $Self Care/Home Management : 8-22 mins  Nyoka Cowden OTR/L Acute Rehabilitation Services Office: [phone]   Evern Bio [facility] 02/04/2023, 2:07 PM

## 2023-02-04 NOTE — Progress Notes (Addendum)
IP rehab admissions - attempted to call and text sister, Darrin Luis, but no response and voice mail is full.  I will try to contact other family as well.  Patient is not able to converse with me at this time.  [phone]  Call placed to niece Taran and voice mail left for niece to return my call.  [phone]

## 2023-02-04 NOTE — Consult Note (Signed)
Physical Medicine and Rehabilitation Consult  Reason for Consult:impaired functional mobility Referring Physician: Hunsucker   HPI: John Spears is a 72 y.o. male with a past medical history of heart failure, MCA infarct, hypertension and alcohol abuse as well as seizure disorder who presented on 01/26/2023 with seizure and altered mental status.  Apparently he fell at home and was found on the floor.  Patient with persistent seizure activity which finally subsided with repeated Versed doses.  Patient was intubated upon arrival and a code stroke was called.  CT of the head was negative.  Long-term EMG monitoring was started by neurology.  Patient had a breakthrough seizures the next 2 days despite being loaded with Keppra and Versed drip.  Patient is also on phenytoin.  Patient was extubated on 02/03/2023.  He completed a course of Unasyn on 01/30/2023 which was started for aspiration pneumonia.  Patient was started on p.o. diet yesterday.  Runs of A-fib reported last night patient was up with physical therapy yesterday and was mod assist for sit to stand transfers and was able to take 1 or 2 steps with max assist using hand-held assistance.  Patient lives with his sister and was independent prior to arrival.   Review of Systems  Unable to perform ROS: Medical condition   Past Medical History:  Diagnosis Date   Alcohol abuse    Heart failure with improved ejection fraction (HFimpEF) (HCC) 02/2022   a. 02/2019 Echo: EF 60-65%, GrI DD; b. 01/2022 Echo: EF 30-35%; c. 06/2022 Echo: EF 60-65%, mild LVH, RI DD, nl RV fxn, triv MR.   Hypertension    Seizure (HCC)    Seizure disorder [facility])    Past Surgical History:  Procedure Laterality Date   JOINT REPLACEMENT     Family History  Problem Relation Age of Onset   Heart attack Mother 58   Social History:  reports that he has quit smoking. He has never used smokeless tobacco. He reports that he does not currently use alcohol after a  past usage of about 84.0 standard drinks of alcohol per week. He reports that he does not currently use drugs after having used the following drugs: Marijuana. Allergies: No Known Allergies Medications Prior to Admission  Medication Sig Dispense Refill   amLODipine (NORVASC) 10 MG tablet Take 10 mg by mouth at bedtime.     aspirin 81 MG chewable tablet Chew 1 tablet (81 mg total) by mouth daily. (Patient taking differently: Chew 81 mg by mouth at bedtime.) 30 tablet 11   atorvastatin (LIPITOR) 40 MG tablet Take 40 mg by mouth at bedtime.     carvedilol (COREG) 25 MG tablet Take 25 mg by mouth in the morning and at bedtime.     cyanocobalamin (VITAMIN B12) 1000 MCG tablet Take 1,000 mcg by mouth in the morning.     feeding supplement (ENSURE ENLIVE / ENSURE PLUS) LIQD Take 237 mLs by mouth 2 (two) times daily between meals. (Patient taking differently: Take 237 mLs by mouth 2 (two) times daily as needed.)     folic acid (FOLVITE) 1 MG tablet Take 1 mg by mouth in the morning.     furosemide (LASIX) 20 MG tablet TAKE 1 TABLET BY MOUTH ONCE A DAY (Patient taking differently: Take 20 mg by mouth in the morning.) 90 tablet 3   levETIRAcetam (KEPPRA) 1000 MG tablet Take 1,000 mg by mouth 2 (two) times daily.     magnesium oxide (MAG-OX) 400 (240  Mg) MG tablet Take 1 tablet by mouth in the morning.     Multiple Vitamin (MULTIVITAMIN WITH MINERALS) TABS tablet Take 1 tablet by mouth daily. (Patient taking differently: Take 1 tablet by mouth at bedtime.)     tamsulosin (FLOMAX) 0.4 MG CAPS capsule Take 0.4 mg by mouth at bedtime.     thiamine (VITAMIN B1) 100 MG tablet Take 100 mg by mouth in the morning.      Home: Home Living Family/patient expects to be discharged to:: Private residence Living Arrangements: Other relatives (sister) Available Help at Discharge: Family Additional Comments: Will need to confirm info in future sessions, pt is questionable historian this date  Functional  History: Prior Function Prior Level of Function : Independent/Modified Independent (assumed based on chart reporting pt was making his own bed when his sister heard him fall) Functional Status:  Mobility: Bed Mobility Overal bed mobility: Needs Assistance Bed Mobility: Supine to Sit, Sit to Supine Supine to sit: HOB elevated, Mod assist Sit to supine: Mod assist, +2 for physical assistance, +2 for safety/equipment General bed mobility comments: Pt needed cues and assistance to bring bil legs off EOB and ascend trunk, modA. ModAx2 needed to direct trunk and lifts legs for return to supine. Transfers Overall transfer level: Needs assistance Equipment used: 1 person hand held assist Transfers: Sit to/from Stand Sit to Stand: Mod assist General transfer comment: ModA needed to shift pt's weight anteriorly, assist pt in powering  up to stand, and gaining balance with anterior approach and pt holding onto therapist for support. Ambulation/Gait Ambulation/Gait assistance: Max [employer] engineer (Feet): 1 Feet Assistive device: 1 person hand held assist Gait Pattern/deviations: Step-to pattern, Decreased step length - left, Decreased step length - right, Decreased stride length, Decreased weight shift to right, Decreased weight shift to left, Trunk flexed, Narrow base of support, Leaning posteriorly General Gait Details: Pt takes slow, small, narrow steps along EOB, needing extensive cues for weight shifting to advance each foot. Pt not clearing feet when stepping. Posterior lean noted. Gait velocity: reduced Gait velocity interpretation: <1.31 ft/sec, indicative of household ambulator    ADL:    Cognition: Cognition Overall Cognitive Status: Impaired/Different from baseline Orientation Level: Oriented to person, Disoriented to place, Disoriented to situation, Disoriented to time Cognition Arousal: Alert Behavior During Therapy: Flat affect Overall Cognitive Status: Impaired/Different  from baseline Area of Impairment: Orientation, Attention, Following commands, Safety/judgement, Awareness, Problem solving Orientation Level: Disoriented to, Situation (did not ask time) Current Attention Level: Sustained Following Commands: Follows one step commands consistently, Follows one step commands with increased time Safety/Judgement: Decreased awareness of safety, Decreased awareness of deficits Awareness: Emergent Problem Solving: Slow processing, Difficulty sequencing, Requires verbal cues, Requires tactile cues, Decreased initiation General Comments: Pt displays poor initiation to bring emesis bag to his mouth when he needs to spit, instead just leaning his head towards it in bed and not using his arms even though he can move them. Pt aware of location but not situation. Slow to proccess cues and needs multi-modal cues to sequence mobility  Blood pressure (!) 154/80, pulse 88, temperature 99.6 F (37.6 C), temperature source Axillary, resp. rate 16, height 5\' 11"  (1.803 m), weight 80 kg, SpO2 99%. Physical Exam Constitutional:      General: He is not in acute distress.    Appearance: He is ill-appearing.     Comments: Wearing bilateral mittens  HENT:     Head: Normocephalic.     Nose:     Comments:  NGT    Mouth/Throat:     Mouth: Mucous membranes are moist.     Comments: Missing most of his teeth Eyes:     Extraocular Movements: Extraocular movements intact.  Cardiovascular:     Rate and Rhythm: Normal rate.  Pulmonary:     Effort: Pulmonary effort is normal.  Abdominal:     Palpations: Abdomen is soft.  Musculoskeletal:        General: No swelling.     Cervical back: Normal range of motion.     Right lower leg: Edema present.     Left lower leg: Edema present.  Neurological:     Mental Status: He is alert.     Comments: Pt alert, oriented to hospital, name. Told me he was from [place] and said his sister was at home. Speech severely dysarthric. Poor tongue/oral  motor control. Moved all 4's left more than right. LUE 3/5. RUE 2-3/5. BLE 2-3/5 prox to distal. Sensed pain in all 4's. No resting tone   Psychiatric:     Comments: Pt a little restless but cooperative.      Results for orders placed or performed during the hospital encounter of 01/26/23 (from the past 24 hours)  Glucose, capillary     Status: Abnormal   Collection Time: 02/03/23 11:47 AM  Result Value Ref Range   Glucose-Capillary 64 (L) 70 - 99 mg/dL  Glucose, capillary     Status: Abnormal   Collection Time: 02/03/23 12:51 PM  Result Value Ref Range   Glucose-Capillary 115 (H) 70 - 99 mg/dL  Glucose, capillary     Status: None   Collection Time: 02/03/23  3:30 PM  Result Value Ref Range   Glucose-Capillary 89 70 - 99 mg/dL  Glucose, capillary     Status: None   Collection Time: 02/03/23  7:42 PM  Result Value Ref Range   Glucose-Capillary 99 70 - 99 mg/dL  Glucose, capillary     Status: None   Collection Time: 02/03/23 11:46 PM  Result Value Ref Range   Glucose-Capillary 91 70 - 99 mg/dL  Glucose, capillary     Status: None   Collection Time: 02/04/23  3:40 AM  Result Value Ref Range   Glucose-Capillary 93 70 - 99 mg/dL  CBC     Status: Abnormal   Collection Time: 02/04/23  4:45 AM  Result Value Ref Range   WBC 9.2 4.0 - 10.5 K/uL   RBC 3.66 (L) 4.22 - 5.81 MIL/uL   Hemoglobin 10.6 (L) 13.0 - 17.0 g/dL   HCT 86.5 (L) 78.4 - 69.6 %   MCV 89.6 80.0 - 100.0 fL   MCH 29.0 26.0 - 34.0 pg   MCHC 32.3 30.0 - 36.0 g/dL   RDW 29.5 28.4 - 13.2 %   Platelets 204 150 - 400 K/uL   nRBC 0.0 0.0 - 0.2 %  Basic metabolic panel     Status: Abnormal   Collection Time: 02/04/23  4:45 AM  Result Value Ref Range   Sodium 141 135 - 145 mmol/L   Potassium 4.5 3.5 - 5.1 mmol/L   Chloride 108 98 - 111 mmol/L   CO2 23 22 - 32 mmol/L   Glucose, Bld 99 70 - 99 mg/dL   BUN 25 (H) 8 - 23 mg/dL   Creatinine, Ser 4.40 0.61 - 1.24 mg/dL   Calcium 9.2 8.9 - 10.2 mg/dL   GFR, Estimated >72  >53 mL/min   Anion gap 10 5 - 15  Glucose,  capillary     Status: None   Collection Time: 02/04/23  8:19 AM  Result Value Ref Range   Glucose-Capillary 80 70 - 99 mg/dL   No results found.  Assessment/Plan: Diagnosis: 72 year old male admitted with seizures and aspiration pneumonia.  Patient with residual encephalopathy and significant debility as a result. Does the need for close, 24 hr/day medical supervision in concert with the patient's rehab needs make it unreasonable for this patient to be served in a less intensive setting? Yes Co-Morbidities requiring supervision/potential complications:  -Seizure monitoring and prevention -Aspiration pneumonia status post antibiotics.  Patient remains on oxygen -Chronic congestive heart failure -New onset atrial fibrillation -History of alcohol use/abuse -impaired oral-motor control. ?dysphagia. Certainly severe dysarthria  Due to bladder management, bowel management, safety, skin/wound care, disease management, medication administration, pain management, and patient education, does the patient require 24 hr/day rehab nursing? Yes and Potentially Does the patient require coordinated care of a physician, rehab nurse, therapy disciplines of PT, OT, SLP to address physical and functional deficits in the context of the above medical diagnosis(es)? Yes Addressing deficits in the following areas: balance, endurance, locomotion, strength, transferring, bowel/bladder control, bathing, dressing, feeding, grooming, toileting, cognition, speech, swallowing, and psychosocial support Can the patient actively participate in an intensive therapy program of at least 3 hrs of therapy per day at least 5 days per week? Yes The potential for patient to make measurable gains while on inpatient rehab is good Anticipated functional outcomes upon discharge from inpatient rehab are supervision  with PT, supervision and min assist with OT, supervision with SLP. Estimated  rehab length of stay to reach the above functional goals is: 14-20 days Anticipated discharge destination: Home Overall Rehab/Functional Prognosis: excellent  POST ACUTE RECOMMENDATIONS: This patient's condition is appropriate for continued rehabilitative care in the following setting: CIR Patient has agreed to participate in recommended program. N/A Note that insurance prior authorization may be required for reimbursement for recommended care.  Comment: Need to confirm social supports. Pt indicates sister is there. Rehab Admissions Coordinator to follow up.      I have personally performed a face to face diagnostic evaluation of this patient. Additionally, I have examined the patient's medical record including any pertinent labs and radiographic images. If the physician assistant has documented in this note, I have reviewed and edited or otherwise concur with the physician assistant's documentation.  Thanks,  Ranelle Oyster, MD 02/04/2023

## 2023-02-04 NOTE — Progress Notes (Signed)
1815 Pt arrived to 3W-34 from 4N. See flowsheet for transfer assessment. NAD.

## 2023-02-04 NOTE — Progress Notes (Signed)
Nutrition Follow-up  DOCUMENTATION CODES:   Not applicable  INTERVENTION:  Encourage adequate PO intake Feedings with assistance  Ensure Enlive po TID, each supplement provides 350 kcal and 20 grams of protein 30 ml ProSource Plus TID, each supplement provides 100 kcals and 15 grams protein.  Continue MVI, thiamine, folic acid  NUTRITION DIAGNOSIS:  Inadequate oral intake related to inability to eat (pt sedated and ventilated) as evidenced by NPO status. - diet advanced though inadequate PO intake remains  GOAL:   Patient will meet greater than or equal to 90% of their needs - goal unmet, addressing via meals and nutrition supplements  MONITOR:   PO intake, Supplement acceptance, Labs, Weight trends  REASON FOR ASSESSMENT:   Consult Enteral/tube feeding initiation and management  ASSESSMENT:   72 y/o male with h/o CVA, seizures, dilated cardiomyopathy, CHF, HTN, HLD, etoh abuse, marijuana use and CKD III who is admitted with seizures and AKI.  Patient extubated yesterday (12/11). Passed second [facility] swallow screen, SLP signed off.   Pt being screened for CIR candidacy.   Checked in with pt at bedside but pt unable to provide any meaningful information d/t dysarthria and orientation x1.   No documented meal completions on file to review since diet advancement. Discussed with RN who reports pt not eating much. Requires assistance with feeding. Intake better with fluids versus solid foods. Tolerating oral medications well.   Admit weight: 82.4 kg Current weight: 80 kg  Medications: folvite, MVI, thiamine  Labs: BUN 25, CBG's 80-115 x24 hours  Diet Order:   Diet Order             DIET SOFT Room service appropriate? Yes; Fluid consistency: Thin  Diet effective now                   EDUCATION NEEDS:   Not appropriate for education at this time  Skin:  Skin Assessment: Reviewed RN Assessment  Last BM:  + 3 unmeasured occurrences x24 hours via  FMS  Height:   Ht Readings from Last 1 Encounters:  02/02/23 5\' 11"  (1.803 m)    Weight:   Wt Readings from Last 1 Encounters:  02/03/23 80 kg    Ideal Body Weight:  78.1 kg  BMI:  Body mass index is 24.6 kg/m.  Estimated Nutritional Needs:   Kcal:  2000-2200  Protein:  115-130g/day  Fluid:  2.0-2.3L/day  Drusilla Kanner, RDN, LDN Clinical Nutrition12

## 2023-02-04 NOTE — Progress Notes (Addendum)
NAME:  John Spears, MRN:  [id number], DOB:  11-09-1950, LOS: 9 ADMISSION DATE:  01/26/2023, CONSULTATION DATE:  01/26/2023 REFERRING MD:  Andria Meuse, EDP, CHIEF COMPLAINT:  seizure   History of Present Illness:  72 year old male with past medical history of heart failure with recovered EF, MCA stroke, hypertension, alcohol use disorder, seizure disorder who presented to the ED via EMS with seizure, altered mental status. Patient unable to participate in history, therefore HPI obtained from ED, neurology notes. Per chart review, patient was up this AM making his bed when sister, Kathie Rhodes, heard a fall/loud noise around 0830. She found patient lying on the floor and called 911 with concern of seizure activity. Notes he has been compliant with AEDs, unclear on his alcohol use, and eating "okay." Per ED triage notes, EMS noted seizure activity, right tremor, leftward gaze. He was given 5mg  versed which did not break seizure activity, and given subsequent 2.5mg  with abatement. He was intubated immediately upon arrival. He was called a code stroke and taken to CT with no evidence of bleed. He was loaded with keppra and started on propofol/fentanyl and admitted to [facility].   Pertinent  Medical History  heart failure with recovered EF, MCA stroke, hypertension, alcohol use disorder, seizure disorder  Significant Hospital Events: Including procedures, antibiotic start and stop dates in addition to other pertinent events   12/3 - Admitted to ICU after seizure, altered mental status. CT head negative. Seen by neuro. LTM started.  12/4 - Heavily sedated no obvious seizure. Prop was weaned off. Then versed. Had break thru seizure w/ worse exam. Got bolus of versed f/b resuming gtt and also started pheno load and taper 12/5 - On a little higher versed than day prior. Still on LTM. Exam perhaps a little better BUT still having breakthrough seizures, Versed increased. 12/6 - No seizures overnight, Versed weaned to  5 12/9 tried PSV but failed due to tachypnea, follows commands but drowsy, on vent  Interim History / Subjective:   Remains extubated.  Encephalopathic.  Tolerating diet.  Blood pressure is elevated.  Resuming some home medications.  Objective:  Blood pressure (!) 154/80, pulse 88, temperature 99.6 F (37.6 C), temperature source Axillary, resp. rate 16, height 5\' 11"  (1.803 m), weight 80 kg, SpO2 99%.        Intake/Output Summary (Last 24 hours) at 02/04/2023 1022 Last data filed at 02/04/2023 0900 Gross per 24 hour  Intake 862.77 ml  Output 4270 ml  Net -3407.23 ml   Filed Weights   01/30/23 0500 02/02/23 0708 02/03/23 0707  Weight: 81.1 kg 81 kg 80 kg   Physical Examination: General: Acutely ill-appearing older man in NAD.  HEENT: [place]/AT, anicteric sclera, PERRL, moist mucous membranes. Neuro: Follows commands, alert, not oriented CV: RRR, no m/g/r. PULM: Normal work of, scattered rhonchi GI: Soft, nontender, nondistended. Hypoactive bowel sounds. Extremities: Trace bilateral symmetric LE edema noted. Skin: Warm/dry, no rashes.  Labs and images reviewed  Resolved Hospital Problem List:     Assessment & Plan:  Acute metabolic encephalopathy s/p Break thru Seizures w/ remote h/o stroke exacerbated by ETOH and prob ETOH wd seizure and cont to have subclinical seizures per LTM on 12/5 -Appreciate assistance of neurology - LTM EEG per neuro >> no seizures seen 12/8 and 12/9, removed 12/10 - AEDs per Neuro (S/p fosphenytoin; continue Keppra, phenytoin), Versed gtt weaned off - Seizure precautions - Neuroprotective measures: HOB > 30 degrees, normoglycemia, normothermia, electrolytes WNL  Acute respiratory failure due to  acute encephalopathy and seizures c/b aspiration PNA  - extubated, 12/11 - Wean O2 as tolerated - Unasyn x 5-day course, end 12/7 - saline nebs with tracheobronchial toilet  Heart failure; most recent echo 06/2022 with EF 60-65%, mild LVH, G1DD  -Half  dose home Coreg resumed 12/12 (bradycardia largely driven by dexmedetomidine) - Continue lasix, lack of robust outout 40 mg IV 12/10, give 80 mg IV 12/11  AKI, resolved Hypophosphatemia, improved  Hypernatremia: Improved.  Hyperlipidemia and h/o Hypertension  - Continue statin - amlodipine, half dose home Coreg as above  Afib: Unclear if new but runs Afib 12/11 PM --Coreg --Consider AC in coming days for stroke ppx - CHADSVASC2 score 5  Alcohol use disorder; ethanol negative in ED. Unclear last drink  - out of window for withdrawal  Best Practice (right click and "Reselect all SmartList Selections" daily)   Diet/type: Regular consistency (see orders) DVT prophylaxis: LMWH Pressure ulcer(s). pressure injury stage n/a was NOT present on admission GI prophylaxis: N/A Lines: N/A Foley:  N/A Code Status:   full code Last date of multidisciplinary goals of care discussion - n/a  Critical care time: n/a     Karren Burly, MD [facility] 02/04/23 10:22 AM  Please see [email or web address] for pager details.  From 7A-7P if no response, please call [phone] After hours, please call ELink [phone]

## 2023-02-05 DIAGNOSIS — G40909 Epilepsy, unspecified, not intractable, without status epilepticus: Secondary | ICD-10-CM | POA: Diagnosis not present

## 2023-02-05 DIAGNOSIS — G40901 Epilepsy, unspecified, not intractable, with status epilepticus: Secondary | ICD-10-CM | POA: Diagnosis not present

## 2023-02-05 LAB — CBC
HCT: 34.4 % — ABNORMAL LOW (ref 39.0–52.0)
Hemoglobin: 11.4 g/dL — ABNORMAL LOW (ref 13.0–17.0)
MCH: 29.5 pg (ref 26.0–34.0)
MCHC: 33.1 g/dL (ref 30.0–36.0)
MCV: 89.1 fL (ref 80.0–100.0)
Platelets: 233 10*3/uL (ref 150–400)
RBC: 3.86 MIL/uL — ABNORMAL LOW (ref 4.22–5.81)
RDW: 14.3 % (ref 11.5–15.5)
WBC: 10.9 10*3/uL — ABNORMAL HIGH (ref 4.0–10.5)
nRBC: 0 % (ref 0.0–0.2)

## 2023-02-05 LAB — BASIC METABOLIC PANEL
Anion gap: 12 (ref 5–15)
BUN: 23 mg/dL (ref 8–23)
CO2: 23 mmol/L (ref 22–32)
Calcium: 9.4 mg/dL (ref 8.9–10.3)
Chloride: 105 mmol/L (ref 98–111)
Creatinine, Ser: 1.36 mg/dL — ABNORMAL HIGH (ref 0.61–1.24)
GFR, Estimated: 56 mL/min — ABNORMAL LOW (ref >60)
Glucose, Bld: 89 mg/dL (ref 70–99)
Potassium: 4.5 mmol/L (ref 3.5–5.1)
Sodium: 140 mmol/L (ref 135–145)

## 2023-02-05 MED ORDER — LEVETIRACETAM 750 MG PO TABS
1500.0000 mg | ORAL_TABLET | Freq: Two times a day (BID) | ORAL | Status: DC
  Administered 2023-02-05 – 2023-02-10 (×10): 1500 mg via ORAL
  Filled 2023-02-05 (×10): qty 2

## 2023-02-05 MED ORDER — CARVEDILOL 12.5 MG PO TABS
25.0000 mg | ORAL_TABLET | Freq: Two times a day (BID) | ORAL | Status: DC
Start: 2023-02-05 — End: 2023-02-10
  Administered 2023-02-05 – 2023-02-10 (×11): 25 mg via ORAL
  Filled 2023-02-05 (×11): qty 2

## 2023-02-05 MED ORDER — LACTATED RINGERS IV BOLUS
500.0000 mL | Freq: Once | INTRAVENOUS | Status: AC
  Administered 2023-02-05: 500 mL via INTRAVENOUS

## 2023-02-05 NOTE — Care Management Important Message (Signed)
Important Message  Patient Details  Name: John Spears MRN: [id number] Date of Birth: [birth year]   Important Message Given:  Yes - Medicare IM    Spoke with Darrin Luis to advised of the IM obtain a verbal content to send a copy for the document to her home address is where the patient lives.   Alto Gandolfo 02/05/2023, 4:25 PM

## 2023-02-05 NOTE — Hospital Course (Signed)
72yo with hx heart failure with recovered EF, MCA stroke, HTN, ETOH abuse, seizures who presented with seizures, requiring intubation. Neurology followed for management of breakthrough seizures and pt ultimately extubated and transferred to floor. Pt was noted to have new finding of afib this visit while in ICU

## 2023-02-05 NOTE — Progress Notes (Addendum)
NEUROLOGY CONSULT FOLLOW UP NOTE   Date of service: February 05, 2023 Patient Name: John Spears MRN:  [id number] DOB:  06-Sep-1950  Brief HPI  John Spears is a 72 year old male with past medical history ETOH abuse,  HF with improved EF, MCA stroke, HTN, seizure disorder who presented with  AMS and breakthrough seizure on 12/3.   Interval Hx/subjective  NO family at the bedside. Patient is awake and alert in NAD.  No new neurological events overnight  Vitals   Vitals:   02/05/23 0400 02/05/23 0411 02/05/23 0810 02/05/23 0932  BP: (!) 144/99  (!) 171/97 139/88  Pulse:   (!) 124 85  Resp: 18  17   Temp: 99.3 F (37.4 C)  98.3 F (36.8 C)   TempSrc: Oral  Oral   SpO2: 98%  99%   Weight:  81 kg    Height:         Body mass index is 24.91 kg/m.  Physical Exam   Constitutional: Elderly gentleman, appears stated age. Intubated, in no acute distress.    Neurologic Examination   He is awake, able to tell me his name and that he is in the hospital but does not know which one.  He is unsure of his age states he is "26", month as "January"  and the year is 2014. He is able to follow simple commands. Right arm 4/5 with drift and weak hand grip, left arm 5/5. Bilateral lower extremities able to lift antigravity and move spontaneously. Sensation intact    Medications  Current Facility-Administered Medications:    (feeding supplement) PROSource Plus liquid 30 mL, 30 mL, Oral, TID BM, Hunsucker, Lesia Sago, MD, 30 mL at 02/05/23 0924   acetaminophen (TYLENOL) 160 MG/5ML solution 650 mg, 650 mg, Oral, Q6H PRN, Hunsucker, Lesia Sago, MD, 650 mg at 02/03/23 1140   amLODipine (NORVASC) tablet 10 mg, 10 mg, Oral, Daily, Hunsucker, Lesia Sago, MD, 10 mg at 02/05/23 0854   atorvastatin (LIPITOR) tablet 40 mg, 40 mg, Oral, Daily, Hunsucker, Lesia Sago, MD, 40 mg at 02/05/23 0924   carvedilol (COREG) tablet 25 mg, 25 mg, Oral, BID WC, Jerald Kief, MD, 25 mg at 02/05/23 0840    Chlorhexidine Gluconate Cloth 2 % PADS 6 each, 6 each, Topical, Daily, Hunsucker, Lesia Sago, MD, 6 each at 02/04/23 0819   docusate (COLACE) 50 MG/5ML liquid 100 mg, 100 mg, Oral, BID PRN, Hunsucker, Lesia Sago, MD   enoxaparin (LOVENOX) injection 40 mg, 40 mg, Subcutaneous, Q24H, Hunsucker, Lesia Sago, MD, 40 mg at 02/04/23 1135   feeding supplement (ENSURE ENLIVE / ENSURE PLUS) liquid 237 mL, 237 mL, Oral, TID BM, Hunsucker, Lesia Sago, MD, 237 mL at 02/05/23 4098   folic acid (FOLVITE) tablet 1 mg, 1 mg, Oral, Daily, Hunsucker, Lesia Sago, MD, 1 mg at 02/05/23 1191   hydrALAZINE (APRESOLINE) injection 10 mg, 10 mg, Intravenous, Q4H PRN, Hunsucker, Lesia Sago, MD, 10 mg at 02/03/23 1407   levETIRAcetam (KEPPRA) tablet 1,000 mg, 1,000 mg, Oral, BID, Hunsucker, Lesia Sago, MD, 1,000 mg at 02/05/23 4782   metoprolol tartrate (LOPRESSOR) injection 2.5-5 mg, 2.5-5 mg, Intravenous, Q3H PRN, Hunsucker, Lesia Sago, MD, 2.5 mg at 02/05/23 0012   multivitamin with minerals tablet 1 tablet, 1 tablet, Oral, Daily, Hunsucker, Lesia Sago, MD, 1 tablet at 02/05/23 9562   Oral care mouth rinse, 15 mL, Mouth Rinse, 4 times per day, Hunsucker, Lesia Sago, MD, 15 mL at 02/05/23 0846   Oral  care mouth rinse, 15 mL, Mouth Rinse, PRN, Hunsucker, Lesia Sago, MD   polyethylene glycol (MIRALAX / GLYCOLAX) packet 17 g, 17 g, Oral, Daily PRN, Hunsucker, Lesia Sago, MD   tamsulosin (FLOMAX) capsule 0.4 mg, 0.4 mg, Oral, QPC breakfast, Hunsucker, Lesia Sago, MD, 0.4 mg at 02/05/23 0840   [COMPLETED] thiamine (VITAMIN B1) 500 mg in sodium chloride 0.9 % 50 mL IVPB, 500 mg, Intravenous, Q8H, Stopped at 01/29/23 0734 **FOLLOWED BY** [COMPLETED] thiamine (VITAMIN B1) 250 mg in sodium chloride 0.9 % 50 mL IVPB, 250 mg, Intravenous, Daily, Stopped at 02/03/23 1137 **FOLLOWED BY** thiamine (VITAMIN B1) injection 100 mg, 100 mg, Intravenous, Daily, Hunsucker, Lesia Sago, MD, 100 mg at 02/05/23 5784   traMADol (ULTRAM) tablet 50 mg, 50 mg, Oral, Q6H  PRN, Hunsucker, Lesia Sago, MD, 50 mg at 02/05/23 0114 Labs and Diagnostic Imaging   CBC:  Recent Labs  Lab 01/31/23 0520 02/01/23 0425 02/03/23 0504 02/04/23 0445 02/05/23 0639  WBC 11.9* 9.2   < > 9.2 10.9*  NEUTROABS 7.7 5.7  --   --   --   HGB 9.7* 9.5*   < > 10.6* 11.4*  HCT 30.9* 30.3*   < > 32.8* 34.4*  MCV 92.8 92.9   < > 89.6 89.1  PLT 110* 132*   < > 204 233   < > = values in this interval not displayed.    Basic Metabolic Panel:  Lab Results  Component Value Date   NA 140 02/05/2023   K 4.5 02/05/2023   CO2 23 02/05/2023   GLUCOSE 89 02/05/2023   BUN 23 02/05/2023   CREATININE 1.36 (H) 02/05/2023   CALCIUM 9.4 02/05/2023   GFRNONAA 56 (L) 02/05/2023   GFRAA >60 04/04/2019   Lipid Panel:  Lab Results  Component Value Date   LDLCALC 133 (H) 03/08/2019   HgbA1c:  Lab Results  Component Value Date   HGBA1C 5.3 01/26/2023   Urine Drug Screen:     Component Value Date/Time   LABOPIA NONE DETECTED 01/27/2023 1800   COCAINSCRNUR NONE DETECTED 01/27/2023 1800   COCAINSCRNUR NONE DETECTED 02/20/2022 1652   LABBENZ POSITIVE (A) 01/27/2023 1800   AMPHETMU NONE DETECTED 01/27/2023 1800   THCU NONE DETECTED 01/27/2023 1800   LABBARB POSITIVE (A) 01/27/2023 1800    Alcohol Level     Component Value Date/Time   ETH <10 01/26/2023 0954   INR  Lab Results  Component Value Date   INR 1.1 01/26/2023   APTT  Lab Results  Component Value Date   APTT 24 01/26/2023   AED levels:  Lab Results  Component Value Date   PHENYTOIN 11.9 02/02/2023    CT Head without contrast(Personally reviewed): 01/26/2023: No hemorrhage or CT evidence of an acute cortical infarct  Encephalomalacia in the left temporal and occipital lobes is unchanged consistent with prior infarct.  cEEG:  01/28/2023 1126 to 01/29/2023 0630  This study initially showed seizures without clinical signs arising from left hemisphere,  26 seizures were noted, lasting about 30-45 seconds each.  Patient was loaded with fosphenytoin at 1238 on 01/28/2023. Last seizure was noted on 01/28/2023 at 1250.  EEG also showed cortical dysfunction in left hemisphere likely secondary to underlying structural abnormality, postictal state. Additionally there was severe diffuse encephalopathy, likely related to sedation.   12/6 - 12/7 This study is suggestive of severe diffuse encephalopathy, likely related to sedation. No seizures or epileptiform discharges were noted.    12/7-12/8 This study showed evidence of epileptogenicity  arising from  left hemisphere. Additionally there is cortical dysfunction in left hemisphere likely secondary to underlying structural abnormality/post-ictal state.  Lastly there is moderate diffuse encephalopathy, likely related to sedation. No seizures were noted.    12/8-12/9 This study showed evidence of epileptogenicity arising from  left hemisphere. Additionally there is cortical dysfunction in left hemisphere likely secondary to underlying structural abnormality/post-ictal state.  Lastly there is moderate diffuse encephalopathy, likely related to sedation. No seizures were noted.  Assessment   John Spears is a 72 y.o. male  who presented on 01/26/2023 with right gaze deviation, right-sided jerking and secondary generalization.    Status epilepticus, resolved Alcohol use disorder   Etiology of status epilepticus: Likely breakthrough seizure in setting of alcohol use disorder/possible alcohol withdrawal (EtOH undetectable at admission) in addition to missed Keppra dose on morning of admission.   On reviewing notes from his previous hospitalization, he is having a very similar time course.  He does have some degree of aphasia from his previous stroke at baseline, and I would expect him to gradually improve over time.  Given his missed dose of medication previously, I am not sure he needs phenytoin in addition to Keppra and I will discontinue it at this time.   I do  think that with a single missed dose of medication leading to status epilepticus, however, that he may benefit from a slightly higher Keppra dose.  Recommendations  -Seizure precautions -Increase keppra to 1,500 mg IV q12h - Will need outpatient follow up with neurology in 4-8 weeks after discharge  -Neurology will sign off. Please call with questions or concerns.  ______________________________________________________________________  Gevena Mart DNP, ACNPC-AG  [facility]  Patient continues to improve, increase Keppra as above.  Neurology will be available as needed.  Ritta Slot, MD [facility] [phone]  If 7pm- 7am, please page neurology on call as listed in AMION.

## 2023-02-05 NOTE — Progress Notes (Addendum)
Inpatient Rehab Admissions Coordinator:  Attempted to contact pt's sister Kathie Rhodes and niece Clovis Riley. Not able to leave a message on sister's voicemail. Left a message on niece's voicemail. Awaiting return call. Will continue to follow.  1113: Taran returned call. Discussed CIR goals and expectations. She requested AC contact pt's sister Kathie Rhodes to discuss CIR.    Wolfgang Phoenix, MS, CCC-SLP Admissions Coordinator [phone]

## 2023-02-05 NOTE — Plan of Care (Signed)
  Problem: Coping: Goal: Ability to adjust to condition or change in health will improve Outcome: Progressing   Problem: Fluid Volume: Goal: Ability to maintain a balanced intake and output will improve Outcome: Progressing   Problem: Skin Integrity: Goal: Risk for impaired skin integrity will decrease Outcome: Progressing   Problem: Tissue Perfusion: Goal: Adequacy of tissue perfusion will improve Outcome: Progressing   Problem: Elimination: Goal: Will not experience complications related to bowel motility Outcome: Progressing Goal: Will not experience complications related to urinary retention Outcome: Progressing   Problem: Pain Management: Goal: General experience of comfort will improve Outcome: Progressing   Problem: Safety: Goal: Ability to remain free from injury will improve Outcome: Progressing

## 2023-02-05 NOTE — Progress Notes (Signed)
  Progress Note   Patient: John Spears UEA:[id number] DOB: [birth year] DOA: 01/26/2023     10 DOS: the patient was seen and examined on 02/05/2023   Brief hospital course: 72yo with hx heart failure with recovered EF, MCA stroke, HTN, ETOH abuse, seizures who presented with seizures, requiring intubation. Neurology followed for management of breakthrough seizures and pt ultimately extubated and transferred to floor. Pt was noted to have new finding of afib this visit while in ICU  Assessment and Plan: Acute toxic metabolic encephalopathy with breakthrough seizures -suspect worsened by ETOH abuse  -Neurology had been following. Was on versed gtt, now off. Continued on keppra, phenytoin -Remains stable today  Acute respiratory hypoxemic failure secondary to seizures and aspiration pna -Initially required intubation  -Now remains extubated, on min O2 support  Chronic diastolic CHF -Appears euvolemic on exam -con coreg 25mg  bid -Pt recently given doses of IV lasix 12/10 and 12/11  ARF -Given gentle boluses today for sinus tach and higher Cr -recheck bmet in AM  Hypernatremia -Normalized  ETOH abuse -ETOH levels neg on 12/3  Afib -Noted recently, currently in sinus tach -Given hx CVA can consider anticoag, however given presenting seizures, concerns for on-going ETOH abuse, unclear if risks outweight benefit -Will d/w pt  Hx CVA -seems stable at this time       Subjective: Without complaints at this time  Physical Exam: Vitals:   02/05/23 0932 02/05/23 1119 02/05/23 1135 02/05/23 1513  BP: 139/88 (!) 98/56 103/72 123/83  Pulse: 85 (!) 104 72 (!) 124  Resp:  17  17  Temp:  98.5 F (36.9 C)  98.2 F (36.8 C)  TempSrc:  Oral  Oral  SpO2:  91% 100% 95%  Weight:      Height:       General exam: Awake, laying in bed, in nad Respiratory system: Normal respiratory effort, no wheezing Cardiovascular system: regular rate, s1, s2 Gastrointestinal system: Soft,  nondistended, positive BS Central nervous system: CN2-12 grossly intact, strength intact Extremities: Perfused, no clubbing Skin: Normal skin turgor, no notable skin lesions seen Psychiatry: Mood normal // no visual hallucinations   Data Reviewed:  Labs reviewed: Na 140, K 4.5, Cr 1.36, WBC 10.9  Family Communication: Pt in room, family not at bedside  Disposition: Status is: Inpatient Remains inpatient appropriate because: Severity of illness  Planned Discharge Destination: Rehab     Author: Rickey Barbara, MD 02/05/2023 4:27 PM  For on call review [email or web address].

## 2023-02-05 NOTE — Plan of Care (Signed)
  Problem: Education: Goal: Ability to describe self-care measures that may prevent or decrease complications (Diabetes Survival Skills Education) will improve Outcome: Progressing Goal: Individualized Educational Video(s) Outcome: Progressing   Problem: Coping: Goal: Ability to adjust to condition or change in health will improve Outcome: Progressing   Problem: Fluid Volume: Goal: Ability to maintain a balanced intake and output will improve Outcome: Progressing   Problem: Health Behavior/Discharge Planning: Goal: Ability to identify and utilize available resources and services will improve Outcome: Progressing Goal: Ability to manage health-related needs will improve Outcome: Progressing   Problem: Metabolic: Goal: Ability to maintain appropriate glucose levels will improve Outcome: Progressing   Problem: Nutritional: Goal: Maintenance of adequate nutrition will improve Outcome: Progressing Goal: Progress toward achieving an optimal weight will improve Outcome: Progressing   Problem: Skin Integrity: Goal: Risk for impaired skin integrity will decrease Outcome: Progressing   Problem: Tissue Perfusion: Goal: Adequacy of tissue perfusion will improve Outcome: Progressing   Problem: Education: Goal: Knowledge of General Education information will improve Description: Including pain rating scale, medication(s)/side effects and non-pharmacologic comfort measures Outcome: Progressing   Problem: Health Behavior/Discharge Planning: Goal: Ability to manage health-related needs will improve Outcome: Progressing   Problem: Clinical Measurements: Goal: Ability to maintain clinical measurements within normal limits will improve Outcome: Progressing Goal: Will remain free from infection Outcome: Progressing Goal: Diagnostic test results will improve Outcome: Progressing Goal: Respiratory complications will improve Outcome: Progressing Goal: Cardiovascular complication will  be avoided Outcome: Progressing   Problem: Activity: Goal: Risk for activity intolerance will decrease Outcome: Progressing   Problem: Nutrition: Goal: Adequate nutrition will be maintained Outcome: Progressing   Problem: Coping: Goal: Level of anxiety will decrease Outcome: Progressing   Problem: Elimination: Goal: Will not experience complications related to bowel motility Outcome: Progressing Goal: Will not experience complications related to urinary retention Outcome: Progressing   Problem: Pain Management: Goal: General experience of comfort will improve Outcome: Progressing   Problem: Safety: Goal: Ability to remain free from injury will improve Outcome: Progressing   Problem: Skin Integrity: Goal: Risk for impaired skin integrity will decrease Outcome: Progressing   Problem: Safety: Goal: Non-violent Restraint(s) Outcome: Progressing

## 2023-02-05 NOTE — Progress Notes (Deleted)
Phenytoin Follow-Up Consult Indication: status epilepticus   No Known Allergies  Patient Measurements: Height: 5\' 11"  (180.3 cm) Weight: 81 kg (178 lb 9.2 oz) IBW/kg (Calculated) : 75.3 TPN AdjBW (KG): 82.4 Body mass index is 24.91 kg/m.   Vital signs: Temp: 98.3 F (36.8 C) (12/13 0810) Temp Source: Oral (12/13 0810) BP: 171/97 (12/13 0810) Pulse Rate: 124 (12/13 0810)  Labs: No results found for: "ALBUMIN", "PHENYTOIN", "PHENYTFREE"  Lab Results  Component Value Date   PHENYTOIN 11.9 02/02/2023   Estimated Creatinine Clearance: 53.8 mL/min (A) (by C-G formula based on SCr of 1.36 mg/dL (H)).    Assessment: 72 yo M with status epilepticus. Pt is on keppra and phenytoin. S/p versed infusion and phenobarbital taper. Pharmacy consulted for phenytoin dosing for seizure control. Pt received fosphenytoin 1500mg  IV x1 as loading dose on 12/5 at 1238. Post load level: 22.4 mcg/mL (corrected for hypoalbuminemia). Now on phenytoin 75 mg IV q 8h for maintenance control following dose reduction from 100 mg IV Q8H on 12/10 (first dose ~2200).   Significant potential drug interactions: s/p phenobarbital taper for alcohol withdrawal + seizures (12/4-12/8, long half life)   Goals of care:  Total phenytoin level: 10-20 mcg/ml Free phenytoin level: 1-2 mcg/ml (send-out)   Plan:  Continue Phenytoin 75 mg Q8H, convert IV to PO 12/13  F/u any further drug interactions, renal function, albumin  Pharmacy will continue to follow regarding obtaining total phenytoin levels and dose adjustments as indicated.  Jani Gravel, PharmD Clinical Pharmacist  02/05/2023 8:12 AM

## 2023-02-05 NOTE — Care Management Important Message (Signed)
Important Message  Patient Details  Name: John Spears MRN: [id number] Date of Birth: 1950-08-18   Important Message Given:  Yes - Medicare IM     Dorena Bodo 02/05/2023, 3:20 PM

## 2023-02-05 NOTE — Progress Notes (Signed)
Physical Therapy Treatment Patient Details Name: John Spears MRN: [id number] DOB: 1951-02-09 Today's Date: 02/05/2023   History of Present Illness Pt is a 72 y.o. male who presented 01/26/23 with seizure activity. Code stroke was activated for unresponsiveness after intubation 12/3. CT head negative. Extubated 12/11. PMH seizures on keppra, HTN, L MCA, heart failure, alcohol abuse    PT Comments  Pt resting in bed on arrival and agreeable to session with steady progress towards acute goals. Pt requiring mod A for bed mobility and transfers to stand with HHA and mod A to pivot to recliner with cues for optimal hand placement. Pt able to maintain sitting balance with CGA for safety for ADL participation. Pt unable to advance RLE to perform stepping towards chair or along EOB during standing transfers. Pt continues to be limited by impaired cognition, weakness, decreased activity tolerance and poor balance. Current plan remains appropriate to address deficits and maximize functional independence and decrease caregiver burden. Pt continues to benefit from skilled PT services to progress toward functional mobility goals.     If plan is discharge home, recommend the following: Two people to help with walking and/or transfers;A lot of help with bathing/dressing/bathroom;Assistance with cooking/housework;Direct supervision/assist for medications management;Direct supervision/assist for financial management;Assist for transportation;Help with stairs or ramp for entrance   Can travel by private vehicle        Equipment Recommendations  Other (comment) (TBA)    Recommendations for Other Services       Precautions / Restrictions Precautions Precautions: Fall Precaution Comments: siezures Restrictions Weight Bearing Restrictions Per Provider Order: No     Mobility  Bed Mobility Overal bed mobility: Needs Assistance Bed Mobility: Supine to Sit     Supine to sit: HOB elevated, Mod assist      General bed mobility comments: mod A to elevate trunk    Transfers Overall transfer level: Needs assistance Equipment used: 1 person hand held assist Transfers: Sit to/from Stand, Bed to chair/wheelchair/BSC Sit to Stand: Mod assist Stand pivot transfers: Mod assist         General transfer comment: mod A to power up to stand from EOB and recliner chair. mod A to guide hips to chair, unable to weight hsift to step feet    Ambulation/Gait                   Stairs             Wheelchair Mobility     Tilt Bed    Modified Rankin (Stroke Patients Only) Modified Rankin (Stroke Patients Only) Pre-Morbid Rankin Score: No symptoms Modified Rankin: Moderately severe disability     Balance Overall balance assessment: Needs assistance Sitting-balance support: Single extremity supported, Feet supported, Bilateral upper extremity supported Sitting balance-Leahy Scale: Poor Sitting balance - Comments: UE support and CGA for safety sitting statically EOB   Standing balance support: Bilateral upper extremity supported, During functional activity, Reliant on assistive device for balance Standing balance-Leahy Scale: Poor Standing balance comment: Reliant on UE support and external physical assistance                            Cognition Arousal: Alert Behavior During Therapy: Flat affect Overall Cognitive Status: Impaired/Different from baseline Area of Impairment: Attention, Following commands, Safety/judgement, Awareness, Problem solving                   Current Attention Level: Sustained   Following Commands:  Follows one step commands consistently, Follows one step commands with increased time Safety/Judgement: Decreased awareness of safety, Decreased awareness of deficits Awareness: Emergent Problem Solving: Slow processing, Difficulty sequencing, Requires verbal cues, Requires tactile cues, Decreased initiation General Comments: Slow  to proccess cues and needs multi-modal cues to sequence mobility or functional tasks.        Exercises      General Comments General comments (skin integrity, edema, etc.): VSS on RA, poor management of saliva, and pt with poor ability to wipe/catch with washcloth to manage      Pertinent Vitals/Pain Pain Assessment Pain Assessment: No/denies pain    Home Living                          Prior Function            PT Goals (current goals can now be found in the care plan section) Acute Rehab PT Goals Patient Stated Goal: none stated PT Goal Formulation: With patient Time For Goal Achievement: 02/17/23 Progress towards PT goals: Progressing toward goals    Frequency    Min 1X/week      PT Plan      Co-evaluation              AM-PAC PT "6 Clicks" Mobility   Outcome Measure  Help needed turning from your back to your side while in a flat bed without using bedrails?: A Little Help needed moving from lying on your back to sitting on the side of a flat bed without using bedrails?: A Lot Help needed moving to and from a bed to a chair (including a wheelchair)?: A Lot Help needed standing up from a chair using your arms (e.g., wheelchair or bedside chair)?: A Lot Help needed to walk in hospital room?: Total Help needed climbing 3-5 steps with a railing? : Total 6 Click Score: 11    End of Session Equipment Utilized During Treatment: Gait belt Activity Tolerance: Patient tolerated treatment well Patient left: in bed;with call bell/phone within reach;with bed alarm set;with nursing/sitter in room Nurse Communication: Mobility status PT Visit Diagnosis: Unsteadiness on feet (R26.81);Other abnormalities of gait and mobility (R26.89);Muscle weakness (generalized) (M62.81);Difficulty in walking, not elsewhere classified (R26.2);Other symptoms and signs involving the nervous system (R29.898)     Time: 1610-9604 PT Time Calculation (min) (ACUTE ONLY): 30  min  Charges:    $Therapeutic Activity: 23-37 mins PT General Charges $$ ACUTE PT VISIT: 1 Visit                     John Spears R. PTA Acute Rehabilitation Services Office: [phone]   Catalina Antigua 02/05/2023, 3:01 PM

## 2023-02-06 DIAGNOSIS — G40901 Epilepsy, unspecified, not intractable, with status epilepticus: Secondary | ICD-10-CM | POA: Diagnosis not present

## 2023-02-06 DIAGNOSIS — G40909 Epilepsy, unspecified, not intractable, without status epilepticus: Secondary | ICD-10-CM | POA: Diagnosis not present

## 2023-02-06 LAB — BASIC METABOLIC PANEL
Anion gap: 10 (ref 5–15)
BUN: 26 mg/dL — ABNORMAL HIGH (ref 8–23)
CO2: 26 mmol/L (ref 22–32)
Calcium: 8.8 mg/dL — ABNORMAL LOW (ref 8.9–10.3)
Chloride: 102 mmol/L (ref 98–111)
Creatinine, Ser: 1.43 mg/dL — ABNORMAL HIGH (ref 0.61–1.24)
GFR, Estimated: 53 mL/min — ABNORMAL LOW (ref >60)
Glucose, Bld: 83 mg/dL (ref 70–99)
Potassium: 4.1 mmol/L (ref 3.5–5.1)
Sodium: 138 mmol/L (ref 135–145)

## 2023-02-06 LAB — MAGNESIUM: Magnesium: 1.9 mg/dL (ref 1.7–2.4)

## 2023-02-06 MED ORDER — THIAMINE MONONITRATE 100 MG PO TABS
100.0000 mg | ORAL_TABLET | Freq: Every day | ORAL | Status: DC
  Administered 2023-02-07 – 2023-02-10 (×4): 100 mg via ORAL
  Filled 2023-02-06 (×4): qty 1

## 2023-02-06 MED ORDER — LACTATED RINGERS IV SOLN: INTRAVENOUS | Status: AC

## 2023-02-06 NOTE — Progress Notes (Signed)
  Progress Note   Patient: John Spears WUJ:[id number] DOB: 1950/04/09 DOA: 01/26/2023     11 DOS: the patient was seen and examined on 02/06/2023   Brief hospital course: 72yo with hx heart failure with recovered EF, MCA stroke, HTN, ETOH abuse, seizures who presented with seizures, requiring intubation. Neurology followed for management of breakthrough seizures and pt ultimately extubated and transferred to floor. Pt was noted to have new finding of afib this visit while in ICU  Assessment and Plan: Acute toxic metabolic encephalopathy with breakthrough seizures -suspect worsened by ETOH abuse  -Neurology had been following. Was on versed gtt, now off. Continued on keppra, phenytoin -Appears stable today  Acute respiratory hypoxemic failure secondary to seizures and aspiration pna -Initially required intubation  -Now remains extubated, on min O2 support  Chronic diastolic CHF -Appears euvolemic to dehydrated on exam -cont coreg 25mg  bid -Pt recently given doses of IV lasix 12/10 and 12/11 -Cr is up to 1.43. Giving gentle fluids  ARF -Given gentle boluses today for sinus tach and higher Cr -recheck bmet in AM  Hypernatremia -Normalized  ETOH abuse -ETOH levels neg on 12/3  Afib -Noted recently, currently in sinus tach -Given hx CVA can consider anticoag, however given presenting seizures, concerns for on-going ETOH abuse, unclear if risks outweight benefit -Will d/w pt  Hx CVA -seems stable at this time      Subjective: States feeling thirsty  Physical Exam: Vitals:   02/06/23 0502 02/06/23 0753 02/06/23 1141 02/06/23 1532  BP:  (!) 155/78 139/73 (!) 142/83  Pulse:  69 64 64  Resp:  18  18  Temp:  98.7 F (37.1 C) 98.6 F (37 C) 99.2 F (37.3 C)  TempSrc:  Oral Oral Oral  SpO2:  99% 100% 100%  Weight: 81.4 kg     Height:       General exam: Conversant, in no acute distress Respiratory system: normal chest rise, clear, no audible  wheezing Cardiovascular system: regular rhythm, s1-s2 Gastrointestinal system: Nondistended, nontender, pos BS Central nervous system: No seizures, no tremors Extremities: No cyanosis, no joint deformities Skin: No rashes, no pallor Psychiatry: Affect normal // no auditory hallucinations   Data Reviewed:  Labs reviewed: Na 138, K 4.1, Cr 1.43  Family Communication: Pt in room, family not at bedside  Disposition: Status is: Inpatient Remains inpatient appropriate because: Severity of illness  Planned Discharge Destination: Rehab     Author: Rickey Barbara, MD 02/06/2023 3:39 PM  For on call review [email or web address].

## 2023-02-06 NOTE — Plan of Care (Signed)
  Problem: Clinical Measurements: Goal: Respiratory complications will improve Outcome: Progressing Goal: Cardiovascular complication will be avoided Outcome: Progressing   Problem: Coping: Goal: Level of anxiety will decrease Outcome: Progressing   Problem: Elimination: Goal: Will not experience complications related to urinary retention Outcome: Progressing   Problem: Skin Integrity: Goal: Risk for impaired skin integrity will decrease Outcome: Progressing

## 2023-02-06 NOTE — Plan of Care (Signed)
  Problem: Nutritional: Goal: Maintenance of adequate nutrition will improve Outcome: Progressing Goal: Progress toward achieving an optimal weight will improve Outcome: Progressing   Problem: Skin Integrity: Goal: Risk for impaired skin integrity will decrease Outcome: Progressing   Problem: Elimination: Goal: Will not experience complications related to bowel motility Outcome: Progressing Goal: Will not experience complications related to urinary retention Outcome: Progressing   Problem: Safety: Goal: Non-violent Restraint(s) Outcome: Progressing   Problem: Skin Integrity: Goal: Risk for impaired skin integrity will decrease Outcome: Progressing

## 2023-02-07 DIAGNOSIS — G40909 Epilepsy, unspecified, not intractable, without status epilepticus: Secondary | ICD-10-CM | POA: Diagnosis not present

## 2023-02-07 DIAGNOSIS — G40901 Epilepsy, unspecified, not intractable, with status epilepticus: Secondary | ICD-10-CM | POA: Diagnosis not present

## 2023-02-07 LAB — BASIC METABOLIC PANEL
Anion gap: 9 (ref 5–15)
BUN: 25 mg/dL — ABNORMAL HIGH (ref 8–23)
CO2: 23 mmol/L (ref 22–32)
Calcium: 9.1 mg/dL (ref 8.9–10.3)
Chloride: 106 mmol/L (ref 98–111)
Creatinine, Ser: 1.32 mg/dL — ABNORMAL HIGH (ref 0.61–1.24)
GFR, Estimated: 58 mL/min — ABNORMAL LOW (ref >60)
Glucose, Bld: 86 mg/dL (ref 70–99)
Potassium: 4.1 mmol/L (ref 3.5–5.1)
Sodium: 138 mmol/L (ref 135–145)

## 2023-02-07 MED ORDER — LOPERAMIDE HCL 2 MG PO CAPS
2.0000 mg | ORAL_CAPSULE | ORAL | Status: DC | PRN
  Administered 2023-02-07 (×2): 2 mg via ORAL
  Filled 2023-02-07 (×2): qty 1

## 2023-02-07 MED ORDER — APIXABAN 5 MG PO TABS
5.0000 mg | ORAL_TABLET | Freq: Two times a day (BID) | ORAL | Status: DC
  Administered 2023-02-07 – 2023-02-10 (×6): 5 mg via ORAL
  Filled 2023-02-07 (×6): qty 1

## 2023-02-07 NOTE — Plan of Care (Signed)
  Problem: Coping: Goal: Ability to adjust to condition or change in health will improve Outcome: Progressing   Problem: Skin Integrity: Goal: Risk for impaired skin integrity will decrease Outcome: Progressing   Problem: Activity: Goal: Risk for activity intolerance will decrease Outcome: Progressing   Problem: Elimination: Goal: Will not experience complications related to bowel motility Outcome: Progressing Goal: Will not experience complications related to urinary retention Outcome: Progressing   Problem: Safety: Goal: Ability to remain free from injury will improve Outcome: Progressing   Problem: Skin Integrity: Goal: Risk for impaired skin integrity will decrease Outcome: Progressing

## 2023-02-07 NOTE — Progress Notes (Signed)
  Progress Note   Patient: John Spears [id number] DOB: 08-Jul-1950 DOA: 01/26/2023     12 DOS: the patient was seen and examined on 02/07/2023   Brief hospital course: 72yo with hx heart failure with recovered EF, MCA stroke, HTN, ETOH abuse, seizures who presented with seizures, requiring intubation. Neurology followed for management of breakthrough seizures and pt ultimately extubated and transferred to floor. Pt was noted to have new finding of afib this visit while in ICU  Assessment and Plan: Acute toxic metabolic encephalopathy with breakthrough seizures -suspect worsened by ETOH abuse  -Neurology had been following. Was on versed gtt, now off. Continued on keppra, phenytoin -Appears stable today  Acute respiratory hypoxemic failure secondary to seizures and aspiration pna -Initially required intubation  -Now remains extubated, on min O2 support  Chronic diastolic CHF -Appears euvolemic to dehydrated on exam -cont coreg 25mg  bid -Pt recently given doses of IV lasix 12/10 and 12/11 -Cr is up to 1.43. Giving gentle fluids  ARF -Continued on IVF. Cr remains elevated -recheck bmet in AM  Hypernatremia -Normalized  ETOH abuse -ETOH levels neg on 12/3  Afib .Click Here to Calculate/Change CHADS2VASc Score The patient's CHADS2-VASc score is 5, indicating a 7.2% annual risk of stroke.  Therefore, anticoagulation is recommended.   CHF History: Yes HTN History: Yes Diabetes History: No Stroke History: Yes Vascular Disease History: No  -Noted transiently recently, now back in sinus -Recent head CT unremarkable  Hx CVA -seems stable at this time  Diarrhea -Pt had apparently been continued with rectal tube since 12/8 -Stool is soft, not water. No fevers. No abd pain. No significant WBC elevation -Will hold all cathartics and stool softner -Will give trial of PRN imodium with plan to remove rectal tube      Subjective: Feels thirsty, but not much of an  appetite. Has a hard time chewing solid food because of his poor dentition  Physical Exam: Vitals:   02/07/23 0500 02/07/23 0751 02/07/23 1101 02/07/23 1551  BP:  (!) 147/70 131/70 (!) 170/72  Pulse:  66 65 73  Resp:  16 18 17   Temp:  98.2 F (36.8 C) 98.9 F (37.2 C) 98.6 F (37 C)  TempSrc:  Oral Oral Oral  SpO2:  98% 97% 97%  Weight: 85 kg     Height:       General exam: Awake, laying in bed, in nad Respiratory system: Normal respiratory effort, no wheezing Cardiovascular system: regular rate, s1, s2 Gastrointestinal system: Soft, nondistended, positive BS Central nervous system: CN2-12 grossly intact, strength intact Extremities: Perfused, no clubbing Skin: Normal skin turgor, no notable skin lesions seen Psychiatry: Mood normal // no visual hallucinations   Data Reviewed:  Labs reviewed: Na 138, K 4.1, Cr 1.32  Family Communication: Pt in room, family not at bedside  Disposition: Status is: Inpatient Remains inpatient appropriate because: Severity of illness  Planned Discharge Destination: Rehab     Author: Rickey Barbara, MD 02/07/2023 4:54 PM  For on call review [email or web address].

## 2023-02-07 NOTE — Progress Notes (Signed)
PHARMACY - ANTICOAGULATION CONSULT NOTE  Pharmacy Consult for apixaban Indication: atrial fibrillation  No Known Allergies  Patient Measurements: Height: 5\' 11"  (180.3 cm) Weight: 85 kg (187 lb 6.3 oz) IBW/kg (Calculated) : 75.3  Vital Signs: Temp: 98.6 F (37 C) (12/15 1551) Temp Source: Oral (12/15 1551) BP: 170/72 (12/15 1551) Pulse Rate: 73 (12/15 1551)  Labs: Recent Labs    02/05/23 0639 02/06/23 0603 02/07/23 0713  HGB 11.4*  --   --   HCT 34.4*  --   --   PLT 233  --   --   CREATININE 1.36* 1.43* 1.32*    Estimated Creatinine Clearance: 55.5 mL/min (A) (by C-G formula based on SCr of 1.32 mg/dL (H)).   Medical History: Past Medical History:  Diagnosis Date   Alcohol abuse    Heart failure with improved ejection fraction (HFimpEF) (HCC) 02/2022   a. 02/2019 Echo: EF 60-65%, GrI DD; b. 01/2022 Echo: EF 30-35%; c. 06/2022 Echo: EF 60-65%, mild LVH, RI DD, nl RV fxn, triv MR.   Hypertension    Seizure (HCC)    Seizure disorder [facility])      Assessment: 22 yoM admitted with seizures. Pt w/ hx CVA and noted to have transient AFib this admit, pharmacy to begin anticoagulation with apixaban. Age 25 y/o, Wt 85kg, SCr 1.3.  Plan:  Apixaban 5mg  BID Pharmacy will sign off, reconsult as needed  Fredonia Highland, PharmD, BCPS, [facility] Clinical Pharmacist [phone] Please check AMION for all [facility] Pharmacy numbers 02/07/2023

## 2023-02-07 NOTE — Plan of Care (Signed)
  Problem: Skin Integrity: Goal: Risk for impaired skin integrity will decrease Outcome: Progressing   Problem: Education: Goal: Knowledge of General Education information will improve Description: Including pain rating scale, medication(s)/side effects and non-pharmacologic comfort measures Outcome: Progressing   Problem: Clinical Measurements: Goal: Respiratory complications will improve Outcome: Progressing Goal: Cardiovascular complication will be avoided Outcome: Progressing   Problem: Coping: Goal: Level of anxiety will decrease Outcome: Progressing   Problem: Elimination: Goal: Will not experience complications related to urinary retention Outcome: Progressing   Problem: Skin Integrity: Goal: Risk for impaired skin integrity will decrease Outcome: Progressing

## 2023-02-08 DIAGNOSIS — E870 Hyperosmolality and hypernatremia: Secondary | ICD-10-CM

## 2023-02-08 DIAGNOSIS — G9341 Metabolic encephalopathy: Secondary | ICD-10-CM | POA: Diagnosis not present

## 2023-02-08 DIAGNOSIS — R197 Diarrhea, unspecified: Secondary | ICD-10-CM

## 2023-02-08 DIAGNOSIS — G40919 Epilepsy, unspecified, intractable, without status epilepticus: Secondary | ICD-10-CM

## 2023-02-08 DIAGNOSIS — F1010 Alcohol abuse, uncomplicated: Secondary | ICD-10-CM | POA: Diagnosis not present

## 2023-02-08 DIAGNOSIS — I639 Cerebral infarction, unspecified: Secondary | ICD-10-CM | POA: Diagnosis not present

## 2023-02-08 DIAGNOSIS — I5020 Unspecified systolic (congestive) heart failure: Secondary | ICD-10-CM

## 2023-02-08 LAB — COMPREHENSIVE METABOLIC PANEL
ALT: 21 U/L (ref 0–44)
AST: 29 U/L (ref 15–41)
Albumin: 2.5 g/dL — ABNORMAL LOW (ref 3.5–5.0)
Alkaline Phosphatase: 58 U/L (ref 38–126)
Anion gap: 9 (ref 5–15)
BUN: 23 mg/dL (ref 8–23)
CO2: 24 mmol/L (ref 22–32)
Calcium: 9.5 mg/dL (ref 8.9–10.3)
Chloride: 104 mmol/L (ref 98–111)
Creatinine, Ser: 1.15 mg/dL (ref 0.61–1.24)
GFR, Estimated: >60 mL/min (ref >60)
Glucose, Bld: 87 mg/dL (ref 70–99)
Potassium: 4.3 mmol/L (ref 3.5–5.1)
Sodium: 137 mmol/L (ref 135–145)
Total Bilirubin: 0.4 mg/dL (ref <1.2)
Total Protein: 6 g/dL — ABNORMAL LOW (ref 6.5–8.1)

## 2023-02-08 LAB — CBC
HCT: 31 % — ABNORMAL LOW (ref 39.0–52.0)
Hemoglobin: 10.1 g/dL — ABNORMAL LOW (ref 13.0–17.0)
MCH: 29.3 pg (ref 26.0–34.0)
MCHC: 32.6 g/dL (ref 30.0–36.0)
MCV: 89.9 fL (ref 80.0–100.0)
Platelets: 273 10*3/uL (ref 150–400)
RBC: 3.45 MIL/uL — ABNORMAL LOW (ref 4.22–5.81)
RDW: 13.8 % (ref 11.5–15.5)
WBC: 8.1 10*3/uL (ref 4.0–10.5)
nRBC: 0 % (ref 0.0–0.2)

## 2023-02-08 NOTE — Progress Notes (Signed)
Physical Therapy Treatment Patient Details Name: John Spears MRN: [id number] DOB: Aug 26, 1950 Today's Date: 02/08/2023   History of Present Illness Pt is a 72 y.o. male who presented 01/26/23 with seizure activity. Code stroke was activated for unresponsiveness after intubation 12/3. CT head negative. Extubated 12/11. PMH seizures on keppra, HTN, L MCA, heart failure, alcohol abuse    PT Comments  Patient progressing this session to in room ambulation with +1 A.  Able to complete seated therex and with much improved sitting balance.  Patient eager to continue improvements.  Remains appropriate for intensive inpatient rehab (>3 hours/day) prior to d/c home with family support.    If plan is discharge home, recommend the following: A lot of help with bathing/dressing/bathroom;Assistance with cooking/housework;Direct supervision/assist for medications management;Direct supervision/assist for financial management;Assist for transportation;Help with stairs or ramp for entrance;A lot of help with walking and/or transfers   Can travel by private vehicle        Equipment Recommendations  Other (comment) (TBA)    Recommendations for Other Services       Precautions / Restrictions Precautions Precautions: Fall Precaution Comments: seizure     Mobility  Bed Mobility Overal bed mobility: Needs Assistance Bed Mobility: Supine to Sit, Sit to Sidelying     Supine to sit: Used rails, HOB elevated, Contact guard   Sit to sidelying: Mod assist General bed mobility comments: up to EOB assist for balance, to sidelying for RN to remove flexiseal, A for LE's    Transfers Overall transfer level: Needs assistance Equipment used: Rolling walker (2 wheels) Transfers: Sit to/from Stand Sit to Stand: Min assist           General transfer comment: A for balance and anterior weight shift; performed x 5    Ambulation/Gait Ambulation/Gait assistance: Mod assist Gait Distance (Feet): 20  Feet (x 2) Assistive device: Rolling walker (2 wheels) Gait Pattern/deviations: Step-to pattern, Step-through pattern, Wide base of support, Trunk flexed, Decreased stride length       General Gait Details: A for balance, walker management and cues for upright posture; to door and back to EOB x 2 with seated rest/exercise in between   Stairs             Wheelchair Mobility     Tilt Bed    Modified Rankin (Stroke Patients Only)       Balance Overall balance assessment: Needs assistance Sitting-balance support: Feet supported Sitting balance-Leahy Scale: Fair Sitting balance - Comments: flexed posture with poor core strength, though sitting unsupported, no LOB   Standing balance support: Bilateral upper extremity supported Standing balance-Leahy Scale: Poor Standing balance comment: UE support needed for balance                            Cognition Arousal: Alert Behavior During Therapy: Flat affect Overall Cognitive Status: Impaired/Different from baseline                     Current Attention Level: Sustained     Safety/Judgement: Decreased awareness of safety              Exercises General Exercises - Lower Extremity Hip Flexion/Marching: Strengthening, 10 reps, Both, Seated Toe Raises: Strengthening, 10 reps, Both, Seated Heel Raises: Strengthening, 10 reps, Both, Seated Mini-Sqauts: Strengthening, Both, Seated (x 3 reps)    General Comments General comments (skin integrity, edema, etc.): HR 76 after in room ambulation, mild dyspnea, though quickly  recovered; still with flexiseal, RN to remove post therapy session today      Pertinent Vitals/Pain Pain Assessment Faces Pain Scale: No hurt    Home Living                          Prior Function            PT Goals (current goals can now be found in the care plan section) Progress towards PT goals: Progressing toward goals    Frequency    Min 1X/week       PT Plan      Co-evaluation              AM-PAC PT "6 Clicks" Mobility   Outcome Measure  Help needed turning from your back to your side while in a flat bed without using bedrails?: A Little Help needed moving from lying on your back to sitting on the side of a flat bed without using bedrails?: A Little Help needed moving to and from a bed to a chair (including a wheelchair)?: A Little Help needed standing up from a chair using your arms (e.g., wheelchair or bedside chair)?: A Little Help needed to walk in hospital room?: A Lot Help needed climbing 3-5 steps with a railing? : Total 6 Click Score: 15    End of Session Equipment Utilized During Treatment: Gait belt Activity Tolerance: Patient tolerated treatment well Patient left: in bed;with call bell/phone within reach;with bed alarm set;with nursing/sitter in room   PT Visit Diagnosis: Other abnormalities of gait and mobility (R26.89);Muscle weakness (generalized) (M62.81);Other symptoms and signs involving the nervous system (R29.898)     Time: 1610-9604 PT Time Calculation (min) (ACUTE ONLY): 24 min  Charges:    $Gait Training: 8-22 mins $Therapeutic Exercise: 8-22 mins PT General Charges $$ ACUTE PT VISIT: 1 Visit                     Sheran Lawless, PT Acute Rehabilitation Services [phone] 02/08/2023    John Spears 02/08/2023, 5:02 PM

## 2023-02-08 NOTE — Discharge Instructions (Signed)

## 2023-02-08 NOTE — [place] FL2 (Signed)
[place] MEDICAID FL2 LEVEL OF CARE FORM     IDENTIFICATION  Patient Name: John Spears Birthdate: 05/25/1950 Sex: male Admission Date (Current Location): 01/26/2023  [facility] and [place] Number:  Chiropodist and Address:  [facility] [place]. [facility], [address] [place], [place] [address]      Provider Number: [id number]  Attending Physician Name and Address:  Rodolph Bong, MD  Relative Name and Phone Number:       Current Level of Care: Hospital Recommended Level of Care: Skilled Nursing Facility Prior Approval Number:    Date Approved/Denied:   PASRR Number: [id number]  Discharge Plan: SNF    Current Diagnoses: Patient Active Problem List   Diagnosis Date Noted   Altered mental status 01/26/2023   Malnutrition of moderate degree 12/24/2022   Encephalopathy acute 12/22/2022   Acute respiratory failure with hypoxia (HCC) 12/22/2022   ETOH abuse 12/22/2022   Hypophosphatemia 12/22/2022   Thrombocytopenia (HCC) 12/22/2022   Breakthrough seizure (HCC) 12/22/2022   Seizures (HCC) 12/21/2022   Chest pain 09/25/2022   CKD (chronic kidney disease), stage III (HCC) 09/25/2022   HFrEF (heart failure with reduced ejection fraction) (HCC) 04/04/2022   Chest congestion 04/04/2022   Chronic cough 04/04/2022   Dilated cardiomyopathy (HCC) 02/26/2022   Acute on chronic combined systolic and diastolic CHF (congestive heart failure) (HCC) 02/26/2022   Alcohol abuse 02/26/2022   Encephalopathy 02/26/2022   Seizures (HCC) 02/20/2022   Hypomagnesemia    Hypokalemia    Transaminitis    Traumatic rhabdomyolysis (HCC)    Thrombocytopenia (HCC)    Weakness    Lower extremity weakness 03/07/2019   Cerebrovascular accident (CVA) (HCC) 04/05/2016    Orientation RESPIRATION BLADDER Height & Weight     Self, Place  Normal Incontinent Weight: 190 lb 11.2 oz (86.5 kg) Height:  5\' 11"  (180.3 cm)  BEHAVIORAL SYMPTOMS/MOOD NEUROLOGICAL BOWEL  NUTRITION STATUS      Incontinent Diet (see DC summary)  AMBULATORY STATUS COMMUNICATION OF NEEDS Skin   Extensive Assist Verbally Skin abrasions (skin tear, right thigh: foam dressing, lift every shift to assess and change daily)                       Personal Care Assistance Level of Assistance  Bathing, Feeding, Dressing Bathing Assistance: Limited assistance Feeding assistance: Limited assistance Dressing Assistance: Limited assistance     Functional Limitations Info  Speech     Speech Info: Impaired    SPECIAL CARE FACTORS FREQUENCY  PT (By licensed PT), OT (By licensed OT)     PT Frequency: 5x/wk OT Frequency: 5x/wk            Contractures Contractures Info: Not present    Additional Factors Info  Code Status, Allergies Code Status Info: Full Allergies Info: NKA           Current Medications (02/08/2023):  This is the current hospital active medication list Current Facility-Administered Medications  Medication Dose Route Frequency Provider Last Rate Last Admin   (feeding supplement) PROSource Plus liquid 30 mL  30 mL Oral TID BM Hunsucker, Lesia Sago, MD   30 mL at 02/08/23 0927   acetaminophen (TYLENOL) 160 MG/5ML solution 650 mg  650 mg Oral Q6H PRN Hunsucker, Lesia Sago, MD   650 mg at 02/03/23 1140   amLODipine (NORVASC) tablet 10 mg  10 mg Oral Daily Hunsucker, Lesia Sago, MD   10 mg at 02/08/23 7846   apixaban (  ELIQUIS) tablet 5 mg  5 mg Oral BID Mosetta Anis, RPH   5 mg at 02/08/23 1610   atorvastatin (LIPITOR) tablet 40 mg  40 mg Oral Daily Hunsucker, Lesia Sago, MD   40 mg at 02/08/23 9604   carvedilol (COREG) tablet 25 mg  25 mg Oral BID WC Jerald Kief, MD   25 mg at 02/08/23 5409   feeding supplement (ENSURE ENLIVE / ENSURE PLUS) liquid 237 mL  237 mL Oral TID BM Hunsucker, Lesia Sago, MD   237 mL at 02/08/23 8119   folic acid (FOLVITE) tablet 1 mg  1 mg Oral Daily Hunsucker, Lesia Sago, MD   1 mg at 02/08/23 1478   hydrALAZINE  (APRESOLINE) injection 10 mg  10 mg Intravenous Q4H PRN Hunsucker, Lesia Sago, MD   10 mg at 02/03/23 1407   levETIRAcetam (KEPPRA) tablet 1,500 mg  1,500 mg Oral BID Rejeana Brock, MD   1,500 mg at 02/08/23 2956   loperamide (IMODIUM) capsule 2 mg  2 mg Oral PRN Jerald Kief, MD   2 mg at 02/07/23 1704   metoprolol tartrate (LOPRESSOR) injection 2.5-5 mg  2.5-5 mg Intravenous Q3H PRN Hunsucker, Lesia Sago, MD   2.5 mg at 02/05/23 0012   multivitamin with minerals tablet 1 tablet  1 tablet Oral Daily Hunsucker, Lesia Sago, MD   1 tablet at 02/08/23 2130   Oral care mouth rinse  15 mL Mouth Rinse 4 times per day Karren Burly, MD   15 mL at 02/08/23 8657   Oral care mouth rinse  15 mL Mouth Rinse PRN Hunsucker, Lesia Sago, MD       tamsulosin [facility]) capsule 0.4 mg  0.4 mg Oral QPC breakfast Hunsucker, Lesia Sago, MD   0.4 mg at 02/08/23 8469   thiamine (VITAMIN B1) tablet 100 mg  100 mg Oral Daily Jerald Kief, MD   100 mg at 02/08/23 6295   traMADol (ULTRAM) tablet 50 mg  50 mg Oral Q6H PRN Hunsucker, Lesia Sago, MD   50 mg at 02/06/23 2113     Discharge Medications: Please see discharge summary for a list of discharge medications.  Relevant Imaging Results:  Relevant Lab Results:   Additional Information SS#: [id number]  Baldemar Lenis, LCSW

## 2023-02-08 NOTE — Progress Notes (Addendum)
Inpatient Rehab Admissions Coordinator:    I attempted to call Pt.'s sister and son to discuss potential CIR admit. Sister's VM is full, so I texted her to request callback. I left voicemails for Pt.'s son and neice requesting callback. I note that pt. Was discharged to SNF following admission a few months ago, so that may be needed again if caregiver support cannot be identified.   Addendum:   I spoke with pt.'s sister Kathie Rhodes and she states that pt. Lives with her but that she is 30 and cannot provide any physical support at d/c. She would like SNF placement in [place] for Pt.   Megan Salon, MS, CCC-SLP Rehab Admissions Coordinator  [phone] (celll) [phone] (office)

## 2023-02-08 NOTE — Progress Notes (Signed)
PROGRESS NOTE    Slevin Saurer  ZOX:[id number] DOB: 09-Dec-1950 DOA: 01/26/2023 PCP: Patient, No Pcp Per (Confirm with patient/family/NH records and if not entered, this HAS to be entered at [facility] point of entry. "No PCP" if truly none.)   Chief Complaint  Patient presents with   Seizures    Brief Narrative:  72yo with hx heart failure with recovered EF, MCA stroke, HTN, ETOH abuse, seizures who presented with seizures, requiring intubation. Neurology followed for management of breakthrough seizures and pt ultimately extubated and transferred to floor. Pt was noted to have new finding of afib this visit while in ICU    Assessment & Plan:   Principal Problem:   Acute metabolic encephalopathy Active Problems:   Breakthrough seizure (HCC)   Cerebrovascular accident (CVA) (HCC)   Alcohol abuse   HFrEF (heart failure with reduced ejection fraction) (HCC)   Acute respiratory failure with hypoxia (HCC)   ETOH abuse   Diarrhea   Hypernatremia   #1 acute toxic metabolic encephalopathy with breakthrough seizures -Likely worsened by history of alcohol abuse. -Patient underwent EEGs and followed by neurology during the hospitalization. -Patient was on Versed gtt. which has been discontinued. -Patient currently stable with no breakthrough seizures noted. -Continue current regimen of Keppra 1500 g twice daily. -Outpatient follow-up with neurology.  2.  Acute respiratory hypoxemic failure secondary to seizures and aspiration pneumonia -Status post intubation and subsequently extubated currently with sats of 97 to 100% on room air. -Status post full course of IV Unasyn.  3.  Chronic diastolic CHF -Currently euvolemic. -Continue Coreg 25 mg twice daily. -Noted to have received doses of IV Lasix 02/02/2023 and 02/03/2023. -Creatinine went up to 1.43 improved currently down to 1.15 with gentle hydration. -Continues Lipitor, Norvasc.  4.  Acute renal failure -Improved with  hydration. -Discontinue IV fluids.  5.  Hypernatremia -Resolved.  6.  History of alcohol abuse -Alcohol level was negative on admission. -No signs of alcohol withdrawal.  7.  Paroxysmal atrial fibrillation -CHA2DS2VASC score 5, indicating 7.2% annual risk of stroke therefore anticoagulation recommended. -Continue Coreg for rate control, Eliquis for anticoagulation.  8.  History of CVA -Stable. -Risk factor modifications. -Continue statin, Eliquis for secondary stroke prophylaxis.  9.  Diarrhea -Patient noted to have rectal tube since 01/31/2023. -Stool noted to be soft, patient afebrile, denies abdominal pain, no significant leukocytosis. -Continue to hold laxatives and stool softener. -Discontinue rectal tube. -Imodium as needed.   DVT prophylaxis: Eliquis Code Status: Full Family Communication: Updated patient.  No family at bedside. Disposition: CIR versus SNF placement  Status is: Inpatient Remains inpatient appropriate because: Severity of illness   Consultants:  Neurology: Dr Iver Nestle 01/26/2023 CIR: Dr Riley Kill 02/04/2023  Procedures:  Chest x-ray 01/26/2023, 01/27/2023, 01/30/2023, 01/31/2023 CT head 01/26/2023 EEG cortrack  Antimicrobials:  Anti-infectives (From admission, onward)    Start     Dose/Rate Route Frequency Ordered Stop   01/30/23 2300  Ampicillin-Sulbactam (UNASYN) 3 g in sodium chloride 0.9 % 100 mL IVPB        3 g 200 mL/hr over 30 Minutes Intravenous Every 6 hours 01/30/23 2153 02/01/23 1745   01/26/23 1200  Ampicillin-Sulbactam (UNASYN) 3 g in sodium chloride 0.9 % 100 mL IVPB        3 g 200 mL/hr over 30 Minutes Intravenous Every 6 hours 01/26/23 1151 01/30/23 1712         Subjective: Patient laying in bed.  Patient asking when he can go home.  Patient wanted  to speak with his sister.  States he is not even sure why he was brought here but knows that when he first got here he came in with seizures.  No further seizure-like activity noted.   Denies any chest pain or shortness of breath.  No abdominal pain.  Rectal tube with some soft brown stool noted.  Objective: Vitals:   02/08/23 0500 02/08/23 0728 02/08/23 1105 02/08/23 1551  BP: (!) 165/83 (!) 158/67 138/69 138/70  Pulse:  64 63 65  Resp:  17 17 18   Temp: 98.4 F (36.9 C) 98.3 F (36.8 C) 98.4 F (36.9 C) (!) 97.5 F (36.4 C)  TempSrc: Oral Oral Oral Oral  SpO2: 98% 100% 97% 100%  Weight: 86.5 kg     Height:        Intake/Output Summary (Last 24 hours) at 02/08/2023 1601 Last data filed at 02/08/2023 1510 Gross per 24 hour  Intake 824 ml  Output 850 ml  Net -26 ml   Filed Weights   02/06/23 0502 02/07/23 0500 02/08/23 0500  Weight: 81.4 kg 85 kg 86.5 kg    Examination:  General exam: Appears calm and comfortable  Respiratory system: Clear to auscultation anterior lung fields.  No wheezes, no crackles, no rhonchi.  Fair air movement.  Speaking in full sentences.  Respiratory effort normal. Cardiovascular system: S1 & S2 heard, RRR. No JVD, murmurs, rubs, gallops or clicks. No pedal edema. Gastrointestinal system: Abdomen is nondistended, soft and nontender. No organomegaly or masses felt. Normal bowel sounds heard. Central nervous system: Alert and oriented. No focal neurological deficits. Extremities: Symmetric 5 x 5 power. Skin: No rashes, lesions or ulcers Psychiatry: Judgement and insight appear normal. Mood & affect appropriate.     Data Reviewed: I have personally reviewed following labs and imaging studies  CBC: Recent Labs  Lab 02/03/23 0504 02/04/23 0445 02/05/23 0639 02/08/23 0554  WBC 5.6 9.2 10.9* 8.1  HGB 9.4* 10.6* 11.4* 10.1*  HCT 29.0* 32.8* 34.4* 31.0*  MCV 90.6 89.6 89.1 89.9  PLT 120* 204 233 273    Basic Metabolic Panel: Recent Labs  Lab 02/04/23 0445 02/05/23 0639 02/06/23 0603 02/07/23 0713 02/08/23 0554  NA 141 140 138 138 137  K 4.5 4.5 4.1 4.1 4.3  CL 108 105 102 106 104  CO2 23 23 26 23 24   GLUCOSE 99  89 83 86 87  BUN 25* 23 26* 25* 23  CREATININE 1.14 1.36* 1.43* 1.32* 1.15  CALCIUM 9.2 9.4 8.8* 9.1 9.5  MG  --   --  1.9  --   --     GFR: Estimated Creatinine Clearance: 63.7 mL/min (by C-G formula based on SCr of 1.15 mg/dL).  Liver Function Tests: Recent Labs  Lab 02/02/23 0925 02/08/23 0554  AST 23 29  ALT 13 21  ALKPHOS 55 58  BILITOT 0.4 0.4  PROT 6.1* 6.0*  ALBUMIN 2.3* 2.5*    CBG: Recent Labs  Lab 02/03/23 2346 02/04/23 0340 02/04/23 0819 02/04/23 1054 02/04/23 1504  GLUCAP 91 93 80 103* 108*     Recent Results (from the past 240 hours)  Culture, blood (Routine X 2) w Reflex to ID Panel     Status: None   Collection Time: 01/30/23 10:15 PM   Specimen: BLOOD RIGHT ARM  Result Value Ref Range Status   Specimen Description BLOOD RIGHT ARM  Final   Special Requests   Final    BOTTLES DRAWN AEROBIC AND ANAEROBIC Blood Culture results may  not be optimal due to an inadequate volume of blood received in culture bottles   Culture   Final    NO GROWTH 5 DAYS Performed at [facility], [address] [place], [place] [address]    Report Status 02/04/2023 FINAL  Final  Culture, blood (Routine X 2) w Reflex to ID Panel     Status: None   Collection Time: 01/30/23 10:31 PM   Specimen: BLOOD RIGHT HAND  Result Value Ref Range Status   Specimen Description BLOOD RIGHT HAND  Final   Special Requests   Final    BOTTLES DRAWN AEROBIC ONLY Blood Culture results may not be optimal due to an inadequate volume of blood received in culture bottles   Culture   Final    NO GROWTH 5 DAYS Performed at [facility], [address], [place], [place] [address]    Report Status 02/04/2023 FINAL  Final         Radiology Studies: No results found.      Scheduled Meds:  (feeding supplement) PROSource Plus  30 mL Oral TID BM   amLODipine  10 mg Oral Daily   apixaban  5 mg Oral BID   atorvastatin  40 mg Oral Daily   carvedilol  25 mg Oral BID WC    feeding supplement  237 mL Oral TID BM   folic acid  1 mg Oral Daily   levETIRAcetam  1,500 mg Oral BID   multivitamin with minerals  1 tablet Oral Daily   mouth rinse  15 mL Mouth Rinse 4 times per day   tamsulosin  0.4 mg Oral QPC breakfast   thiamine  100 mg Oral Daily   Continuous Infusions:   LOS: 13 days    Time spent: 35 mins    Ramiro Harvest, MD [facility]   To contact the attending provider between 7A-7P or the covering provider during after hours 7P-7A, please log into the web site [email or web address] and access using universal [place] password for that web site. If you do not have the password, please call the hospital operator.  02/08/2023, 4:01 PM

## 2023-02-08 NOTE — Plan of Care (Signed)
  Problem: Education: Goal: Ability to describe self-care measures that may prevent or decrease complications (Diabetes Survival Skills Education) will improve Outcome: Progressing Goal: Individualized Educational Video(s) Outcome: Progressing   Problem: Coping: Goal: Ability to adjust to condition or change in health will improve Outcome: Progressing   Problem: Fluid Volume: Goal: Ability to maintain a balanced intake and output will improve Outcome: Progressing   Problem: Health Behavior/Discharge Planning: Goal: Ability to identify and utilize available resources and services will improve Outcome: Progressing Goal: Ability to manage health-related needs will improve Outcome: Progressing   Problem: Metabolic: Goal: Ability to maintain appropriate glucose levels will improve Outcome: Progressing   Problem: Nutritional: Goal: Maintenance of adequate nutrition will improve Outcome: Progressing Goal: Progress toward achieving an optimal weight will improve Outcome: Progressing   Problem: Skin Integrity: Goal: Risk for impaired skin integrity will decrease Outcome: Progressing   Problem: Tissue Perfusion: Goal: Adequacy of tissue perfusion will improve Outcome: Progressing   Problem: Education: Goal: Knowledge of General Education information will improve Description: Including pain rating scale, medication(s)/side effects and non-pharmacologic comfort measures Outcome: Progressing   Problem: Health Behavior/Discharge Planning: Goal: Ability to manage health-related needs will improve Outcome: Progressing   Problem: Clinical Measurements: Goal: Ability to maintain clinical measurements within normal limits will improve Outcome: Progressing Goal: Will remain free from infection Outcome: Progressing Goal: Diagnostic test results will improve Outcome: Progressing Goal: Respiratory complications will improve Outcome: Progressing Goal: Cardiovascular complication will  be avoided Outcome: Progressing   Problem: Activity: Goal: Risk for activity intolerance will decrease Outcome: Progressing   Problem: Nutrition: Goal: Adequate nutrition will be maintained Outcome: Progressing   Problem: Coping: Goal: Level of anxiety will decrease Outcome: Progressing   Problem: Elimination: Goal: Will not experience complications related to bowel motility Outcome: Progressing Goal: Will not experience complications related to urinary retention Outcome: Progressing   Problem: Pain Management: Goal: General experience of comfort will improve Outcome: Progressing   Problem: Safety: Goal: Ability to remain free from injury will improve Outcome: Progressing   Problem: Skin Integrity: Goal: Risk for impaired skin integrity will decrease Outcome: Progressing   Problem: Safety: Goal: Non-violent Restraint(s) Outcome: Progressing

## 2023-02-09 ENCOUNTER — Telehealth (HOSPITAL_COMMUNITY): Payer: Self-pay | Admitting: Pharmacy Technician

## 2023-02-09 ENCOUNTER — Other Ambulatory Visit (HOSPITAL_COMMUNITY): Payer: Self-pay

## 2023-02-09 DIAGNOSIS — G9341 Metabolic encephalopathy: Secondary | ICD-10-CM | POA: Diagnosis not present

## 2023-02-09 DIAGNOSIS — F1010 Alcohol abuse, uncomplicated: Secondary | ICD-10-CM | POA: Diagnosis not present

## 2023-02-09 DIAGNOSIS — I639 Cerebral infarction, unspecified: Secondary | ICD-10-CM | POA: Diagnosis not present

## 2023-02-09 DIAGNOSIS — G40919 Epilepsy, unspecified, intractable, without status epilepticus: Secondary | ICD-10-CM | POA: Diagnosis not present

## 2023-02-09 LAB — BASIC METABOLIC PANEL
Anion gap: 8 (ref 5–15)
BUN: 25 mg/dL — ABNORMAL HIGH (ref 8–23)
CO2: 21 mmol/L — ABNORMAL LOW (ref 22–32)
Calcium: 9.2 mg/dL (ref 8.9–10.3)
Chloride: 107 mmol/L (ref 98–111)
Creatinine, Ser: 1.08 mg/dL (ref 0.61–1.24)
GFR, Estimated: >60 mL/min (ref >60)
Glucose, Bld: 109 mg/dL — ABNORMAL HIGH (ref 70–99)
Potassium: 4.3 mmol/L (ref 3.5–5.1)
Sodium: 136 mmol/L (ref 135–145)

## 2023-02-09 LAB — CBC
HCT: 30.4 % — ABNORMAL LOW (ref 39.0–52.0)
Hemoglobin: 9.7 g/dL — ABNORMAL LOW (ref 13.0–17.0)
MCH: 29.1 pg (ref 26.0–34.0)
MCHC: 31.9 g/dL (ref 30.0–36.0)
MCV: 91.3 fL (ref 80.0–100.0)
Platelets: 263 10*3/uL (ref 150–400)
RBC: 3.33 MIL/uL — ABNORMAL LOW (ref 4.22–5.81)
RDW: 13.9 % (ref 11.5–15.5)
WBC: 7.1 10*3/uL (ref 4.0–10.5)
nRBC: 0 % (ref 0.0–0.2)

## 2023-02-09 LAB — MAGNESIUM: Magnesium: 1.8 mg/dL (ref 1.7–2.4)

## 2023-02-09 NOTE — Progress Notes (Signed)
Occupational Therapy Treatment Patient Details Name: John Spears MRN: [id number] DOB: 01/10/1951 Today's Date: 02/09/2023   History of present illness Pt is a 72 y.o. male who presented 01/26/23 with seizure activity. Code stroke was activated for unresponsiveness after intubation 12/3. CT head negative. Extubated 12/11. PMH seizures on keppra, HTN, L MCA, heart failure, alcohol abuse   OT comments  Pt progressing towards goals, overall needs CGA for bed mobility and transfers with RW. Pt able to stand for sinkside ADL, min cues to locate items on R side (soap and paper towel). Pt able to visually track to R but has preference for head to turned to L. Pt presenting with impairments listed below, will follow acutely. Patient will benefit from intensive inpatient follow up therapy, >3 hours/day, however if unable will benefit from postacute rehab at d/c.       If plan is discharge home, recommend the following:  Two people to help with walking and/or transfers;A lot of help with bathing/dressing/bathroom;Assistance with cooking/housework;Direct supervision/assist for medications management;Direct supervision/assist for financial management;Assist for transportation;Help with stairs or ramp for entrance;Supervision due to cognitive status   Equipment Recommendations  Other (comment) (defer)    Recommendations for Other Services PT consult;Speech consult    Precautions / Restrictions Precautions Precautions: Fall Precaution Comments: seizure Restrictions Weight Bearing Restrictions Per Provider Order: No       Mobility Bed Mobility Overal bed mobility: Needs Assistance Bed Mobility: Supine to Sit, Sit to Supine     Supine to sit: Contact guard Sit to supine: Contact guard assist        Transfers Overall transfer level: Needs assistance Equipment used: Rolling walker (2 wheels) Transfers: Sit to/from Stand Sit to Stand: Contact guard assist                  Balance Overall balance assessment: Needs assistance Sitting-balance support: Feet supported Sitting balance-Leahy Scale: Fair     Standing balance support: Bilateral upper extremity supported Standing balance-Leahy Scale: Poor                             ADL either performed or assessed with clinical judgement   ADL Overall ADL's : Needs assistance/impaired     Grooming: Wash/dry hands;Contact guard assist;Standing               Lower Body Dressing: Moderate assistance Lower Body Dressing Details (indicate cue type and reason): reaches down to pull up socks Toilet Transfer: Contact guard assist;Ambulation;Regular Toilet;Rolling walker (2 wheels)   Toileting- Clothing Manipulation and Hygiene: Maximal assistance Toileting - Clothing Manipulation Details (indicate cue type and reason): pericare in standing            Extremity/Trunk Assessment Upper Extremity Assessment Upper Extremity Assessment: Generalized weakness   Lower Extremity Assessment Lower Extremity Assessment: Defer to PT evaluation        Vision   Vision Assessment?: Vision impaired- to be further tested in functional context;Yes Eye Alignment: Within Functional Limits Alignment/Gaze Preference: Gaze left Tracking/Visual Pursuits: Decreased smoothness of horizontal tracking;Impaired - to be further tested in functional context;Requires cues, head turns, or add eye shifts to track Additional Comments: tracks to R side has a preference for head turn to L, reports sometimes it is "hard to see" on the R side   Perception     Praxis      Cognition Arousal: Alert Behavior During Therapy: Flat affect Overall Cognitive Status: Impaired/Different from baseline  Area of Impairment: Attention, Following commands, Safety/judgement, Awareness, Problem solving                 Orientation Level: Time (states it is January) Current Attention Level: Sustained   Following Commands:  Follows one step commands consistently, Follows one step commands with increased time Safety/Judgement: Decreased awareness of safety Awareness: Emergent Problem Solving: Slow processing, Difficulty sequencing, Requires verbal cues, Requires tactile cues, Decreased initiation          Exercises      Shoulder Instructions       General Comments VSS    Pertinent Vitals/ Pain       Pain Assessment Pain Assessment: No/denies pain  Home Living                                          Prior Functioning/Environment              Frequency  Min 1X/week        Progress Toward Goals  OT Goals(current goals can now be found in the care plan section)  Progress towards OT goals: Progressing toward goals  Acute Rehab OT Goals Patient Stated Goal: none stated OT Goal Formulation: With patient Time For Goal Achievement: 02/18/23 Potential to Achieve Goals: Good ADL Goals Pt Will Perform Grooming: with supervision;sitting Pt Will Perform Upper Body Dressing: with min assist;sitting Pt Will Perform Lower Body Dressing: with mod assist;sitting/lateral leans Pt Will Transfer to Toilet: with min assist;stand pivot transfer Pt Will Perform Toileting - Clothing Manipulation and hygiene: with mod assist;sit to/from stand Additional ADL Goal #1: Pt will recall 3 energy conservation strategies for ADL with no cues  Plan      Co-evaluation                 AM-PAC OT "6 Clicks" Daily Activity     Outcome Measure   Help from another person eating meals?: A Little Help from another person taking care of personal grooming?: A Little Help from another person toileting, which includes using toliet, bedpan, or urinal?: A Lot Help from another person bathing (including washing, rinsing, drying)?: A Lot Help from another person to put on and taking off regular upper body clothing?: A Little Help from another person to put on and taking off regular lower body  clothing?: A Lot 6 Click Score: 15    End of Session Equipment Utilized During Treatment: Gait belt;Rolling walker (2 wheels)  OT Visit Diagnosis: Unsteadiness on feet (R26.81);Other abnormalities of gait and mobility (R26.89);Muscle weakness (generalized) (M62.81);Low vision, both eyes (H54.2);Other symptoms and signs involving the nervous system (R29.898)   Activity Tolerance Patient tolerated treatment well   Patient Left in bed;with call bell/phone within reach;with bed alarm set   Nurse Communication Mobility status        Time: 1344-1401 OT Time Calculation (min): 17 min  Charges: OT General Charges $OT Visit: 1 Visit OT Treatments $Self Care/Home Management : 8-22 mins  Carver Fila, OTD, OTR/L SecureChat Preferred Acute Rehab [phone]   [name] 02/09/2023, 2:18 PM

## 2023-02-09 NOTE — Telephone Encounter (Signed)
Patient Product/process development scientist completed.    The patient is insured through [facility]. Patient has Medicare and is not eligible for a copay card, but may be able to apply for patient assistance, if available.    Ran test claim for Eliquis 5 mg and the current 30 day co-pay is $0.00.   This test claim was processed through [facility]- copay amounts may vary at other pharmacies due to pharmacy/plan contracts, or as the patient moves through the different stages of their insurance plan.     Roland Earl, CPHT Pharmacy Technician III Certified Patient Advocate [facility] Pharm[facility] Patient Advocate Team Direct Number: [phone]  Fax: [phone]

## 2023-02-09 NOTE — Plan of Care (Signed)
No acute changes. On seizure precautions. DC to SNF.   Problem: Education: Goal: Ability to describe self-care measures that may prevent or decrease complications (Diabetes Survival Skills Education) will improve Outcome: Completed/Met Goal: Individualized Educational Video(s) Outcome: Completed/Met   Problem: Coping: Goal: Ability to adjust to condition or change in health will improve Outcome: Completed/Met   Problem: Fluid Volume: Goal: Ability to maintain a balanced intake and output will improve Outcome: Completed/Met   Problem: Health Behavior/Discharge Planning: Goal: Ability to identify and utilize available resources and services will improve Outcome: Completed/Met Goal: Ability to manage health-related needs will improve Outcome: Completed/Met   Problem: Metabolic: Goal: Ability to maintain appropriate glucose levels will improve Outcome: Completed/Met   Problem: Nutritional: Goal: Maintenance of adequate nutrition will improve Outcome: Completed/Met Goal: Progress toward achieving an optimal weight will improve Outcome: Completed/Met   Problem: Skin Integrity: Goal: Risk for impaired skin integrity will decrease Outcome: Completed/Met   Problem: Tissue Perfusion: Goal: Adequacy of tissue perfusion will improve Outcome: Completed/Met   Problem: Education: Goal: Knowledge of General Education information will improve Description: Including pain rating scale, medication(s)/side effects and non-pharmacologic comfort measures Outcome: Completed/Met   Problem: Health Behavior/Discharge Planning: Goal: Ability to manage health-related needs will improve Outcome: Completed/Met   Problem: Clinical Measurements: Goal: Ability to maintain clinical measurements within normal limits will improve Outcome: Completed/Met Goal: Will remain free from infection Outcome: Completed/Met Goal: Diagnostic test results will improve Outcome: Completed/Met Goal: Respiratory  complications will improve Outcome: Completed/Met Goal: Cardiovascular complication will be avoided Outcome: Completed/Met   Problem: Activity: Goal: Risk for activity intolerance will decrease Outcome: Completed/Met   Problem: Nutrition: Goal: Adequate nutrition will be maintained Outcome: Completed/Met   Problem: Coping: Goal: Level of anxiety will decrease Outcome: Completed/Met   Problem: Elimination: Goal: Will not experience complications related to bowel motility Outcome: Completed/Met Goal: Will not experience complications related to urinary retention Outcome: Completed/Met   Problem: Pain Management: Goal: General experience of comfort will improve Outcome: Completed/Met   Problem: Safety: Goal: Ability to remain free from injury will improve Outcome: Completed/Met   Problem: Skin Integrity: Goal: Risk for impaired skin integrity will decrease Outcome: Completed/Met   Problem: Safety: Goal: Non-violent Restraint(s) Outcome: Completed/Met

## 2023-02-09 NOTE — TOC Progression Note (Signed)
Transition of Care [facility]) - Progression Note    Patient Details  Name: John Spears MRN: [id number] Date of Birth: March 01, 1950  [facility]) CM/SW Contact  Baldemar Lenis, [place] Phone Number: 02/09/2023, 3:27 PM  Clinical Narrative:   CSW updated by Rehab Admissions that family is unable to provide care for patient, asking for SNF. CSW completed referral and faxed out, has been at [facility] recently, waiting for them to review referral on if they could accept back. CSW to follow.    Expected Discharge Plan: Skilled Nursing Facility    Expected Discharge Plan and Services                                               Social Determinants of Health (SDOH) Interventions SDOH Screenings   Food Insecurity: Patient Unable To Answer (01/27/2023)  Housing: Patient Unable To Answer (01/27/2023)  Transportation Needs: Patient Unable To Answer (01/27/2023)  Utilities: Patient Unable To Answer (01/27/2023)  Tobacco Use: Medium Risk (01/26/2023)    Readmission Risk Interventions     No data to display

## 2023-02-09 NOTE — Plan of Care (Signed)
  Problem: Education: Goal: Ability to describe self-care measures that may prevent or decrease complications (Diabetes Survival Skills Education) will improve Outcome: Progressing Goal: Individualized Educational Video(s) Outcome: Progressing   Problem: Nutritional: Goal: Maintenance of adequate nutrition will improve Outcome: Progressing Goal: Progress toward achieving an optimal weight will improve Outcome: Progressing   Problem: Activity: Goal: Risk for activity intolerance will decrease Outcome: Progressing   Problem: Safety: Goal: Ability to remain free from injury will improve Outcome: Progressing

## 2023-02-09 NOTE — TOC Progression Note (Signed)
Transition of Care [facility]) - Progression Note    Patient Details  Name: John Spears MRN: [id number] Date of Birth: [birth year]  Transition of Care [facility]) CM/SW Contact  Baldemar Lenis, [place] Phone Number: 02/09/2023, 3:28 PM  Clinical Narrative:    [facility] patient. CSW requested CMA to initiate insurance authorization. CSW met with sisters John Spears and John Spears at bedside to provide update and answer questions. CSW to follow.    Expected Discharge Plan: Skilled Nursing Facility    Expected Discharge Plan and Services                                               Social Determinants of Health (SDOH) Interventions SDOH Screenings   Food Insecurity: Patient Unable To Answer (01/27/2023)  Housing: Patient Unable To Answer (01/27/2023)  Transportation Needs: Patient Unable To Answer (01/27/2023)  Utilities: Patient Unable To Answer (01/27/2023)  Tobacco Use: Medium Risk (01/26/2023)    Readmission Risk Interventions     No data to display

## 2023-02-09 NOTE — Progress Notes (Signed)
PROGRESS NOTE    John Spears  ZOX:[id number] DOB: 1950-06-04 DOA: 01/26/2023 PCP: Patient, No Pcp Per (Confirm with patient/family/NH records and if not entered, this HAS to be entered at [facility] point of entry. "No PCP" if truly none.)   Chief Complaint  Patient presents with   Seizures    Brief Narrative:  72yo with hx heart failure with recovered EF, MCA stroke, HTN, ETOH abuse, seizures who presented with seizures, requiring intubation. Neurology followed for management of breakthrough seizures and pt ultimately extubated and transferred to floor. Pt was noted to have new finding of afib this visit while in ICU    Assessment & Plan:   Principal Problem:   Acute metabolic encephalopathy Active Problems:   Breakthrough seizure (HCC)   Cerebrovascular accident (CVA) (HCC)   Alcohol abuse   HFrEF (heart failure with reduced ejection fraction) (HCC)   Acute respiratory failure with hypoxia (HCC)   ETOH abuse   Diarrhea   Hypernatremia   #1 acute toxic metabolic encephalopathy with breakthrough seizures -Likely worsened by history of alcohol abuse. -Patient underwent EEGs and followed by neurology during the hospitalization. -Patient was on Versed gtt. which has been discontinued. -Patient currently stable with no breakthrough seizures noted. -Continue current regimen of Keppra 1500 g twice daily. -Outpatient follow-up with neurology.  2.  Acute respiratory hypoxemic failure secondary to seizures and aspiration pneumonia -Status post intubation and subsequently extubated currently with sats of 98%-100% on room air. -Status post full course of IV Unasyn.  3.  Chronic diastolic CHF -Currently euvolemic. -Continue Coreg 25 mg twice daily. -Noted to have received doses of IV Lasix 02/02/2023 and 02/03/2023. -Creatinine went up to 1.43 improved currently down to 1.08.  -Renal function improved with gentle hydration.  -Continue Lipitor, Norvasc, Coreg.  4.  Acute renal  failure -Improved with hydration. -IVF discontinued.  5.  Hypernatremia -Resolved.  6.  History of alcohol abuse -Alcohol level was negative on admission. -No signs of alcohol withdrawal.  7.  Paroxysmal atrial fibrillation -CHA2DS2VASC score 5, indicating 7.2% annual risk of stroke therefore anticoagulation recommended. -Coreg for rate control.  Eliquis for anticoagulation.   8.  History of CVA -Stable. -Risk factor modifications. -Continue statin, Eliquis for secondary stroke prophylaxis.  9.  Diarrhea -Patient noted to have rectal tube since 01/31/2023. -Stool noted to be soft, patient afebrile, denies abdominal pain, no significant leukocytosis. -Continue to hold laxatives and stool softener. -Rectal tube discontinued with no further diarrhea noted per RN.   -Imodium as needed.    DVT prophylaxis: Eliquis Code Status: Full Family Communication: Updated patient and sisters at bedside. Disposition: Medically stable.  Awaiting SNF when bed available.  Status is: Inpatient Remains inpatient appropriate because: Severity of illness   Consultants:  Neurology: Dr Iver Nestle 01/26/2023 CIR: Dr Riley Kill 02/04/2023  Procedures:  Chest x-ray 01/26/2023, 01/27/2023, 01/30/2023, 01/31/2023 CT head 01/26/2023 EEG cortrack  Antimicrobials:  Anti-infectives (From admission, onward)    Start     Dose/Rate Route Frequency Ordered Stop   01/30/23 2300  Ampicillin-Sulbactam (UNASYN) 3 g in sodium chloride 0.9 % 100 mL IVPB        3 g 200 mL/hr over 30 Minutes Intravenous Every 6 hours 01/30/23 2153 02/01/23 1745   01/26/23 1200  Ampicillin-Sulbactam (UNASYN) 3 g in sodium chloride 0.9 % 100 mL IVPB        3 g 200 mL/hr over 30 Minutes Intravenous Every 6 hours 01/26/23 1151 01/30/23 1712  Subjective: Patient lying in bed.  Denies any chest pain or shortness of breath.  No abdominal pain.  Overall feels well.  Sisters at bedside.  Objective: Vitals:   02/09/23 0341 02/09/23  0353 02/09/23 0747 02/09/23 1134  BP: 129/60  (!) 162/85 (!) 140/66  Pulse: 60  65 65  Resp: 17  17 17   Temp:   98.8 F (37.1 C) 98.3 F (36.8 C)  TempSrc:   Oral Oral  SpO2: 98%  98% 100%  Weight:  86 kg    Height:        Intake/Output Summary (Last 24 hours) at 02/09/2023 1253 Last data filed at 02/08/2023 1510 Gross per 24 hour  Intake 237 ml  Output 350 ml  Net -113 ml   Filed Weights   02/07/23 0500 02/08/23 0500 02/09/23 0353  Weight: 85 kg 86.5 kg 86 kg    Examination:  General exam: NAD Respiratory system: CTAB.  No wheezes, no crackles, no rhonchi.  Fair air movement.  Speaking in full sentences.  Normal respiratory effort.   Cardiovascular system: Regular rate rhythm no murmurs rubs or gallops.  No JVD.  No pitting lower extremity edema.  Gastrointestinal system: Abdomen is soft, nontender, nondistended, positive bowel sounds.  No rebound.  No guarding.  Central nervous system: Alert and oriented. No focal neurological deficits. Extremities: Symmetric 5 x 5 power. Skin: No rashes, lesions or ulcers Psychiatry: Judgement and insight appear normal. Mood & affect appropriate.     Data Reviewed: I have personally reviewed following labs and imaging studies  CBC: Recent Labs  Lab 02/03/23 0504 02/04/23 0445 02/05/23 0639 02/08/23 0554 02/09/23 0519  WBC 5.6 9.2 10.9* 8.1 7.1  HGB 9.4* 10.6* 11.4* 10.1* 9.7*  HCT 29.0* 32.8* 34.4* 31.0* 30.4*  MCV 90.6 89.6 89.1 89.9 91.3  PLT 120* 204 233 273 263    Basic Metabolic Panel: Recent Labs  Lab 02/05/23 0639 02/06/23 0603 02/07/23 0713 02/08/23 0554 02/09/23 0519  NA 140 138 138 137 136  K 4.5 4.1 4.1 4.3 4.3  CL 105 102 106 104 107  CO2 23 26 23 24  21*  GLUCOSE 89 83 86 87 109*  BUN 23 26* 25* 23 25*  CREATININE 1.36* 1.43* 1.32* 1.15 1.08  CALCIUM 9.4 8.8* 9.1 9.5 9.2  MG  --  1.9  --   --  1.8    GFR: Estimated Creatinine Clearance: 67.8 mL/min (by C-G formula based on SCr of 1.08  mg/dL).  Liver Function Tests: Recent Labs  Lab 02/08/23 0554  AST 29  ALT 21  ALKPHOS 58  BILITOT 0.4  PROT 6.0*  ALBUMIN 2.5*    CBG: Recent Labs  Lab 02/03/23 2346 02/04/23 0340 02/04/23 0819 02/04/23 1054 02/04/23 1504  GLUCAP 91 93 80 103* 108*     Recent Results (from the past 240 hours)  Culture, blood (Routine X 2) w Reflex to ID Panel     Status: None   Collection Time: 01/30/23 10:15 PM   Specimen: BLOOD RIGHT ARM  Result Value Ref Range Status   Specimen Description BLOOD RIGHT ARM  Final   Special Requests   Final    BOTTLES DRAWN AEROBIC AND ANAEROBIC Blood Culture results may not be optimal due to an inadequate volume of blood received in culture bottles   Culture   Final    NO GROWTH 5 DAYS Performed at [facility], [address] [place], [place] [address]    Report Status 02/04/2023  FINAL  Final  Culture, blood (Routine X 2) w Reflex to ID Panel     Status: None   Collection Time: 01/30/23 10:31 PM   Specimen: BLOOD RIGHT HAND  Result Value Ref Range Status   Specimen Description BLOOD RIGHT HAND  Final   Special Requests   Final    BOTTLES DRAWN AEROBIC ONLY Blood Culture results may not be optimal due to an inadequate volume of blood received in culture bottles   Culture   Final    NO GROWTH 5 DAYS Performed at [facility], [address] [place], [place] [address]    Report Status 02/04/2023 FINAL  Final         Radiology Studies: No results found.      Scheduled Meds:  (feeding supplement) PROSource Plus  30 mL Oral TID BM   amLODipine  10 mg Oral Daily   apixaban  5 mg Oral BID   atorvastatin  40 mg Oral Daily   carvedilol  25 mg Oral BID WC   feeding supplement  237 mL Oral TID BM   folic acid  1 mg Oral Daily   levETIRAcetam  1,500 mg Oral BID   multivitamin with minerals  1 tablet Oral Daily   mouth rinse  15 mL Mouth Rinse 4 times per day   tamsulosin  0.4 mg Oral QPC breakfast   thiamine  100 mg  Oral Daily   Continuous Infusions:   LOS: 14 days    Time spent: 35 mins    Ramiro Harvest, MD [facility]   To contact the attending provider between 7A-7P or the covering provider during after hours 7P-7A, please log into the web site [email or web address] and access using universal [place] password for that web site. If you do not have the password, please call the hospital operator.  02/09/2023, 12:53 PM

## 2023-02-10 DIAGNOSIS — G40919 Epilepsy, unspecified, intractable, without status epilepticus: Secondary | ICD-10-CM | POA: Diagnosis not present

## 2023-02-10 DIAGNOSIS — G9341 Metabolic encephalopathy: Secondary | ICD-10-CM | POA: Diagnosis not present

## 2023-02-10 DIAGNOSIS — F1010 Alcohol abuse, uncomplicated: Secondary | ICD-10-CM | POA: Diagnosis not present

## 2023-02-10 DIAGNOSIS — I639 Cerebral infarction, unspecified: Secondary | ICD-10-CM | POA: Diagnosis not present

## 2023-02-10 LAB — CBC
HCT: 30.6 % — ABNORMAL LOW (ref 39.0–52.0)
Hemoglobin: 9.9 g/dL — ABNORMAL LOW (ref 13.0–17.0)
MCH: 29.2 pg (ref 26.0–34.0)
MCHC: 32.4 g/dL (ref 30.0–36.0)
MCV: 90.3 fL (ref 80.0–100.0)
Platelets: 313 10*3/uL (ref 150–400)
RBC: 3.39 MIL/uL — ABNORMAL LOW (ref 4.22–5.81)
RDW: 13.9 % (ref 11.5–15.5)
WBC: 7.4 10*3/uL (ref 4.0–10.5)
nRBC: 0 % (ref 0.0–0.2)

## 2023-02-10 LAB — BASIC METABOLIC PANEL
Anion gap: 7 (ref 5–15)
BUN: 29 mg/dL — ABNORMAL HIGH (ref 8–23)
CO2: 23 mmol/L (ref 22–32)
Calcium: 9.2 mg/dL (ref 8.9–10.3)
Chloride: 108 mmol/L (ref 98–111)
Creatinine, Ser: 0.98 mg/dL (ref 0.61–1.24)
GFR, Estimated: >60 mL/min (ref >60)
Glucose, Bld: 93 mg/dL (ref 70–99)
Potassium: 4.3 mmol/L (ref 3.5–5.1)
Sodium: 138 mmol/L (ref 135–145)

## 2023-02-10 MED ORDER — APIXABAN 5 MG PO TABS: 5.0000 mg | ORAL_TABLET | Freq: Two times a day (BID) | ORAL | 1 refills | Status: DC

## 2023-02-10 MED ORDER — LEVETIRACETAM 750 MG PO TABS: 1500.0000 mg | ORAL_TABLET | Freq: Two times a day (BID) | ORAL | 3 refills | Status: DC

## 2023-02-10 MED ORDER — ACETAMINOPHEN 160 MG/5ML PO SOLN: 650.0000 mg | Freq: Four times a day (QID) | ORAL | Status: DC | PRN

## 2023-02-10 NOTE — Discharge Summary (Signed)
Physician Discharge Summary  John Spears YQM:[id number] DOB: August 21, 1950 DOA: 01/26/2023  PCP: Patient, No Pcp Per  Admit date: 01/26/2023 Discharge date: 02/10/2023  Time spent: 60 minutes  Recommendations for Outpatient Follow-up:  Follow-up with Dr. Sherryll Burger, neurology in 3 to 4 weeks. Follow-up with MD at skilled nursing facility.  Patient will need a basic metabolic profile done in 1 week to follow-up on electrolytes and renal function.   Discharge Diagnoses:  Principal Problem:   Acute metabolic encephalopathy Active Problems:   Breakthrough seizure (HCC)   Cerebrovascular accident (CVA) (HCC)   Alcohol abuse   HFrEF (heart failure with reduced ejection fraction) (HCC)   Acute respiratory failure with hypoxia (HCC)   ETOH abuse   Diarrhea   Hypernatremia   Discharge Condition: Stable and improved.  Diet recommendation: Heart healthy/dysphagia 2 diet.  Filed Weights   02/07/23 0500 02/08/23 0500 02/09/23 0353  Weight: 85 kg 86.5 kg 86 kg    History of present illness:  HPI per Dr. Vassie Loll 72 year old male with past medical history of heart failure with recovered EF, MCA stroke, hypertension, alcohol use disorder, seizure disorder who presented to the ED via EMS with seizure, altered mental status. Patient unable to participate in history, therefore HPI obtained from ED, neurology notes. Per chart review, patient was up this AM making his bed when sister, Kathie Rhodes, heard a fall/loud noise around 0830. She found patient lying on the floor and called 911 with concern of seizure activity. Notes he has been compliant with AEDs, unclear on his alcohol use, and eating "okay." Per ED triage notes, EMS noted seizure activity, right tremor, leftward gaze. He was given 5mg  versed which did not break seizure activity, and given subsequent 2.5mg  with abatement. He was intubated immediately upon arrival. He was called a code stroke and taken to CT with no evidence of bleed. He was loaded with  keppra and started on propofol/fentanyl and admitted to [facility].   Hospital Course:  #1 acute toxic metabolic encephalopathy with breakthrough seizures -Likely worsened by history of alcohol abuse. -Patient underwent EEGs and followed by neurology during the hospitalization. -Patient was on Versed gtt. which was discontinued. -Patient was subsequently started on Keppra and dose uptitrated to 1500 mg twice daily.   -Patient remained stable had no further breakthrough seizures during the hospitalization will be discharged in stable and improved condition.   -Outpatient follow-up with primary neurologist.    2.  Acute respiratory hypoxemic failure secondary to seizures and aspiration pneumonia -Status post intubation and subsequently extubated currently with sats of 98%-100% on room air. -Status post full course of IV Unasyn.   3.  Chronic diastolic CHF -Patient maintained on home regimen Coreg 25 mg twice daily.  -Noted to have received doses of IV Lasix 02/02/2023 and 02/03/2023. -Creatinine went up to 1.43 improved and was down to 0.98 by day of discharge.  -Patient maintained on home regimen Lipitor, Norvasc and Coreg.   -Patient was euvolemic by day of discharge.    4.  Acute renal failure -Improved with hydration.   5.  Hypernatremia -Resolved during the hospitalization.   6.  History of alcohol abuse -Alcohol level was negative on admission. -No signs of alcohol withdrawal.   7.  Paroxysmal atrial fibrillation -CHA2DS2VASC score 5, indicating 7.2% annual risk of stroke therefore anticoagulation recommended. -Patient maintained on Coreg for rate control.  Eliquis for anticoagulation.    8.  History of CVA -Stable. -Risk factor modifications. -Patient maintained on statin, Eliquis for secondary  stroke prophylaxis.   -Aspirin discontinued due to increased risk of bleeding with Eliquis.    9.  Diarrhea -Patient noted to have rectal tube since 01/31/2023. -Stool noted to be  soft, patient afebrile, denies abdominal pain, no significant leukocytosis. -Laxatives and stool softeners held with resolution of diarrhea.  -Rectal tube discontinued with no further diarrhea noted per RN.   -Patient was placed on Imodium as needed.       Procedures: Chest x-ray 01/26/2023, 01/27/2023, 01/30/2023, 01/31/2023 CT head 01/26/2023 EEG cortrack  Consultations: Neurology: Dr Iver Nestle 01/26/2023 CIR: Dr Riley Kill 02/04/2023  Discharge Exam: Vitals:   02/10/23 0840 02/10/23 1159  BP: (!) 140/75 (!) 140/68  Pulse: 62 68  Resp: 17 17  Temp: 98.4 F (36.9 C) 97.6 F (36.4 C)  SpO2: 99% 100%    General: NAD Cardiovascular: RRR no murmurs rubs or gallops.  No JVD.  No lower extremity edema. Respiratory: Clear to auscultation bilaterally.  No wheezes, no crackles, no rhonchi.  Fair air movement.  Speaking in full sentences.  Discharge Instructions   Discharge Instructions     Diet - low sodium heart healthy   Complete by: As directed    Dysphagia 2 diet with thin liquids.   Increase activity slowly   Complete by: As directed    No wound care   Complete by: As directed       Allergies as of 02/10/2023   No Known Allergies      Medication List     STOP taking these medications    aspirin 81 MG chewable tablet       TAKE these medications    acetaminophen 160 MG/5ML solution Commonly known as: TYLENOL Take 20.3 mLs (650 mg total) by mouth every 6 (six) hours as needed for fever (Temp >100.4).   amLODipine 10 MG tablet Commonly known as: NORVASC Take 10 mg by mouth at bedtime.   apixaban 5 MG Tabs tablet Commonly known as: ELIQUIS Take 1 tablet (5 mg total) by mouth 2 (two) times daily.   atorvastatin 40 MG tablet Commonly known as: LIPITOR Take 40 mg by mouth at bedtime.   carvedilol 25 MG tablet Commonly known as: COREG Take 25 mg by mouth in the morning and at bedtime.   cyanocobalamin 1000 MCG tablet Commonly known as: VITAMIN B12 Take  1,000 mcg by mouth in the morning.   feeding supplement Liqd Take 237 mLs by mouth 2 (two) times daily between meals. What changed:  when to take this reasons to take this   folic acid 1 MG tablet Commonly known as: FOLVITE Take 1 mg by mouth in the morning.   furosemide 20 MG tablet Commonly known as: LASIX TAKE 1 TABLET BY MOUTH ONCE A DAY What changed: when to take this   levETIRAcetam 750 MG tablet Commonly known as: KEPPRA Take 2 tablets (1,500 mg total) by mouth 2 (two) times daily. What changed:  medication strength how much to take   magnesium oxide 400 (240 Mg) MG tablet Commonly known as: MAG-OX Take 1 tablet by mouth in the morning.   multivitamin with minerals Tabs tablet Take 1 tablet by mouth daily. What changed: when to take this   tamsulosin 0.4 MG Caps capsule Commonly known as: FLOMAX Take 0.4 mg by mouth at bedtime.   thiamine 100 MG tablet Commonly known as: VITAMIN B1 Take 100 mg by mouth in the morning.       No Known Allergies  Follow-up Information  Lonell Face, MD. Schedule an appointment as soon as possible for a visit in 3 week(s).   Specialty: Neurology Why: Follow-up in 3 to 4 weeks Contact information: [address] [facility] [place] [address] [phone]         MD AT SNF Follow up.                   The results of significant diagnostics from this hospitalization (including imaging, microbiology, ancillary and laboratory) are listed below for reference.    Significant Diagnostic Studies: DG CHEST PORT 1 VIEW Result Date: 01/31/2023 CLINICAL DATA:  Pneumonia.  Seizures. EXAM: PORTABLE CHEST 1 VIEW COMPARISON:  Radiographs 01/30/2023 and 01/27/2023.  CT 12/21/2022. FINDINGS: 0706 hours. The endotracheal and feeding tubes appear unchanged in position. Tip of the feeding tube is not visualized. The heart size and mediastinal contours are stable. Interval improved aeration of  the left lung base. The lungs now appear clear, and there is no evidence of pneumothorax or significant pleural effusion. The bones appear unremarkable. IMPRESSION: Interval improved aeration of the left lung base. No evidence of acute cardiopulmonary process. Electronically Signed   By: Carey Bullocks M.D.   On: 01/31/2023 10:45   DG CHEST PORT 1 VIEW Result Date: 01/30/2023 CLINICAL DATA:  72 year old male intubated. EXAM: PORTABLE CHEST 1 VIEW COMPARISON:  Portable chest 01/27/2023 and earlier. FINDINGS: Portable AP semi upright view at 0803 hours. Enteric feeding tube in place now, courses to the abdomen with tip not included. Endotracheal tube tip in good position between the clavicles and carina. Increasing left lung base opacity now obscuring the left hemidiaphragm. Streaky and confluent left perihilar opacity also. No pneumothorax or pulmonary edema. No definite effusion. Negative visible bowel gas. IMPRESSION: 1. Satisfactory ET tube. Enteric feeding tube courses to the abdomen. 2. Progressed left lower lobe collapse or consolidation since 01/27/2023 and increasing right perihilar opacity. Consider bilateral infection. Electronically Signed   By: Odessa Fleming M.D.   On: 01/30/2023 09:20   DG Abd Portable 1V Result Date: 01/27/2023 CLINICAL DATA:  72 year old male feeding tube placement. EXAM: PORTABLE ABDOMEN - 1 VIEW COMPARISON:  12/23/2022 and earlier. FINDINGS: Portable AP supine view at 1046 hours. Enteric feeding tube courses to the abdomen and from the left upper quadrant across midline to the right upper quadrant. Tip is likely at the distal stomach or proximal duodenum. Negative visible lung bases, bowel gas pattern. No acute osseous abnormality identified. IMPRESSION: Feeding tube terminates at the distal stomach or proximal duodenum. Advance several additional cm to ensure post pyloric placement. Electronically Signed   By: Odessa Fleming M.D.   On: 01/27/2023 11:16   Overnight EEG with video Result  Date: 01/27/2023 Charlsie Quest, MD     01/28/2023  9:14 AM Patient Name: Ramirez Prawl MRN: [id number] Epilepsy Attending: Charlsie Quest Referring Physician/Provider: Gordy Councilman, MD Duration: 01/26/2023 1126 to 01/27/2023 1126 Patient history: 72 year old with prior CVA, seizure disorder, LKW 8:30 AM, witnessed seizure activity getting eeg to evaluate for seizure Level of alertness:  comatose AEDs during EEG study: Propofol, Versed, LEV Technical aspects: This EEG study was done with scalp electrodes positioned according to the 10-20 International system of electrode placement. Electrical activity was reviewed with band pass filter of 1-70Hz , sensitivity of 7 uV/mm, display speed of 71mm/sec with a 60Hz  notched filter applied as appropriate. EEG data were recorded continuously and digitally stored.  Video monitoring was available and reviewed as appropriate. Description:  EEG showed continuous generalized and lateralized left hemisphere 3 to 6 Hz theta-delta slowing admixed with 12 to 15 Hz beta activity.  Of note, at the beginning of the study, 2- 4 seconds of generalized EEG attenuation was also noted. Hyperventilation and photic stimulation were not performed.   ABNORMALITY -Continuous slow, generalized and lateralized left hemisphere IMPRESSION: This study is suggestive of cortical dysfunction in left hemisphere likely secondary underlying structural abnormality.  Additionally there is severe diffuse encephalopathy likely related to sedation.  No seizures or epileptiform discharges were seen during this time. Charlsie Quest   DG CHEST PORT 1 VIEW Result Date: 01/27/2023 CLINICAL DATA:  Possible aspiration. EXAM: PORTABLE CHEST 1 VIEW COMPARISON:  01/26/2023; 09/25/2022; chest CT-09/25/2022 FINDINGS: Unchanged cardiac silhouette and mediastinal contours. Interval advancement of endotracheal tube with tip now 2.2 cm above the carina. Interval advancement of enteric tube with side port now  projecting over the gastric fundus. Improved aeration of the left lung base. No new focal airspace opacities. No pleural effusion or pneumothorax no evidence of edema. No acute osseous abnormalities. IMPRESSION: 1. Improved positioning of support apparatus as above. No pneumothorax. 2. Improved aeration of the left lung base without new focal airspace opacities. Electronically Signed   By: Simonne Come M.D.   On: 01/27/2023 09:25   DG Chest Portable 1 View Result Date: 01/26/2023 CLINICAL DATA:  Status post intubation. EXAM: PORTABLE CHEST 1 VIEW COMPARISON:  12/22/2022 and older exams. FINDINGS: Endotracheal tube tip projects 4.5 cm above the carina. Nasal/orogastric tube passes below the diaphragm into the mid stomach. There is opacity at the left lung base consistent with atelectasis, pneumonia or a combination. Remainder of the lungs is clear. Cardiac silhouette is mildly enlarged. No mediastinal or hilar masses. No convincing pleural effusion and no pneumothorax. IMPRESSION: 1. Well-positioned endotracheal tube and nasal/orogastric tube. 2. Left lung base opacity that may reflect atelectasis, pneumonia or a combination. No pulmonary edema Electronically Signed   By: Amie Portland M.D.   On: 01/26/2023 10:47   CT HEAD CODE STROKE WO CONTRAST Result Date: 01/26/2023 CLINICAL DATA:  Code stroke.  Neuro deficit, acute, stroke suspected EXAM: CT HEAD WITHOUT CONTRAST TECHNIQUE: Contiguous axial images were obtained from the base of the skull through the vertex without intravenous contrast. RADIATION DOSE REDUCTION: This exam was performed according to the departmental dose-optimization program which includes automated exposure control, adjustment of the mA and/or kV according to patient size and/or use of iterative reconstruction technique. COMPARISON:  CT Head 12/26/22 FINDINGS: Brain: No hemorrhage. No hydrocephalus. No extra-axial fluid collection. There is an evolving left occipital temporal region infarct.  No mass effect. No mass lesion. No CT evidence of an acute cortical infarct. Vascular: No hyperdense vessel or unexpected calcification. Skull: Normal. Negative for fracture or focal lesion. Sinuses/Orbits: No middle ear or mastoid effusion. Paranasal sinuses are clear. Orbits are unremarkable. Other: Partially imaged endotracheal and enteric tubes in place. Scout images obtained at the time of imaging are notable for possible new consolidative opacity at the left lung base. This is better assessed on same day chest radiograph ASPECTS [facility] Stroke Program Early CT Score): 10 IMPRESSION: No hemorrhage or CT evidence of an acute cortical infarct Findings were paged to Dr. Iver Nestle on 01/26/23 at 10:29 AM. Electronically Signed   By: Lorenza Cambridge M.D.   On: 01/26/2023 10:31    Microbiology: No results found for this or any previous visit (from the past 240 hours).   Labs: Basic Metabolic Panel: Recent  Labs  Lab 02/06/23 0603 02/07/23 0713 02/08/23 0554 02/09/23 0519 02/10/23 0533  NA 138 138 137 136 138  K 4.1 4.1 4.3 4.3 4.3  CL 102 106 104 107 108  CO2 26 23 24  21* 23  GLUCOSE 83 86 87 109* 93  BUN 26* 25* 23 25* 29*  CREATININE 1.43* 1.32* 1.15 1.08 0.98  CALCIUM 8.8* 9.1 9.5 9.2 9.2  MG 1.9  --   --  1.8  --    Liver Function Tests: Recent Labs  Lab 02/08/23 0554  AST 29  ALT 21  ALKPHOS 58  BILITOT 0.4  PROT 6.0*  ALBUMIN 2.5*   No results for input(s): "LIPASE", "AMYLASE" in the last 168 hours. No results for input(s): "AMMONIA" in the last 168 hours. CBC: Recent Labs  Lab 02/04/23 0445 02/05/23 0639 02/08/23 0554 02/09/23 0519 02/10/23 0533  WBC 9.2 10.9* 8.1 7.1 7.4  HGB 10.6* 11.4* 10.1* 9.7* 9.9*  HCT 32.8* 34.4* 31.0* 30.4* 30.6*  MCV 89.6 89.1 89.9 91.3 90.3  PLT 204 233 273 263 313   Cardiac Enzymes: No results for input(s): "CKTOTAL", "CKMB", "CKMBINDEX", "TROPONINI" in the last 168 hours. BNP: BNP (last 3 results) Recent Labs    02/23/22 1441  09/25/22 0633  BNP 1,539.0* 233.1*    ProBNP (last 3 results) No results for input(s): "PROBNP" in the last 8760 hours.  CBG: Recent Labs  Lab 02/03/23 2346 02/04/23 0340 02/04/23 0819 02/04/23 1054 02/04/23 1504  GLUCAP 91 93 80 103* 108*       Signed:  Ramiro Harvest MD.  [facility] 02/10/2023, 1:28 PM

## 2023-02-10 NOTE — Progress Notes (Signed)
Pt. D/C to SNF [facility] and report given. Pt. Denies pain Alert and resp. Unlabored.

## 2023-02-10 NOTE — Plan of Care (Signed)

## 2023-02-10 NOTE — Plan of Care (Signed)
  Problem: Education: Goal: Ability to describe self-care measures that may prevent or decrease complications (Diabetes Survival Skills Education) will improve Outcome: Progressing Goal: Individualized Educational Video(s) Outcome: Progressing   Problem: Education: Goal: Ability to describe self-care measures that may prevent or decrease complications (Diabetes Survival Skills Education) will improve Outcome: Progressing Goal: Individualized Educational Video(s) Outcome: Progressing   Problem: Coping: Goal: Ability to adjust to condition or change in health will improve Outcome: Progressing   Problem: Fluid Volume: Goal: Ability to maintain a balanced intake and output will improve Outcome: Progressing   Problem: Health Behavior/Discharge Planning: Goal: Ability to identify and utilize available resources and services will improve Outcome: Progressing Goal: Ability to manage health-related needs will improve Outcome: Progressing   Problem: Metabolic: Goal: Ability to maintain appropriate glucose levels will improve Outcome: Progressing   Problem: Nutritional: Goal: Maintenance of adequate nutrition will improve Outcome: Progressing Goal: Progress toward achieving an optimal weight will improve Outcome: Progressing   Problem: Skin Integrity: Goal: Risk for impaired skin integrity will decrease Outcome: Progressing   Problem: Tissue Perfusion: Goal: Adequacy of tissue perfusion will improve Outcome: Progressing

## 2023-02-10 NOTE — TOC Transition Note (Signed)
Transition of Care [facility]) - Discharge Note   Patient Details  Name: John Spears MRN: [id number] Date of Birth: 09-May-1950  [facility] CM/SW Contact:  Baldemar Lenis, LCSW Phone Number: 02/10/2023, 1:40 PM   Clinical Narrative:   Patient received insurance authorization to admit to SNF,  Health Care able to admit today. CSW updated MD, sent discharge information to [facility]. CSW attempted to reach sister, Kathie Rhodes, but no answer and unable to leave a voicemail as the mailbox was full. Will continue to try to reach sister. Transport arranged with [facility] for next available.  Nurse to call report to [phone], Room 40B.    Final next level of care: Skilled Nursing Facility Barriers to Discharge: Barriers Resolved   Patient Goals and CMS Choice            Discharge Placement              Patient chooses bed at: [facility] Patient to be transferred to facility by: [facility] Name of family member notified: Attempted to reach sister, Kathie Rhodes, no answer and unable to leave a voicemail as mailbox is full Patient and family notified of of transfer: 02/10/23  Discharge Plan and Services Additional resources added to the After Visit Summary for                                       Social Drivers of Health (SDOH) Interventions SDOH Screenings   Food Insecurity: Patient Unable To Answer (01/27/2023)  Housing: Patient Unable To Answer (01/27/2023)  Transportation Needs: Patient Unable To Answer (01/27/2023)  Utilities: Patient Unable To Answer (01/27/2023)  Tobacco Use: Medium Risk (01/26/2023)     Readmission Risk Interventions     No data to display

## 2023-02-10 NOTE — Progress Notes (Signed)
Physical Therapy Treatment Patient Details Name: John Spears MRN: [id number] DOB: [birth year] Today's Date: 02/10/2023   History of Present Illness Pt is a 72 y.o. male who presented 01/26/23 with seizure activity. Code stroke was activated for unresponsiveness after intubation 12/3. CT head negative. Extubated 12/11. PMH seizures on keppra, HTN, L MCA, heart failure, alcohol abuse    PT Comments  Pt resting in bed on arrival and agreeable to session with steady progress towards acute goals. Pt progressing gait distance slightly with min A to steady and manage RW as pt with continued R inattention require cues for anticipatory correction of path to avoid obstacles. Pt with continued flexed posture in standing needed cues to correct with poor carryover. Pt up in chair at end of session. Current plan remains appropriate to address deficits and maximize functional independence and decrease caregiver burden. Pt continues to benefit from skilled PT services to progress toward functional mobility goals.      If plan is discharge home, recommend the following: A lot of help with bathing/dressing/bathroom;Assistance with cooking/housework;Direct supervision/assist for medications management;Direct supervision/assist for financial management;Assist for transportation;Help with stairs or ramp for entrance;A lot of help with walking and/or transfers   Can travel by private vehicle        Equipment Recommendations  Other (comment) (TBA)    Recommendations for Other Services       Precautions / Restrictions Precautions Precautions: Fall Precaution Comments: seizure Restrictions Weight Bearing Restrictions Per Provider Order: No     Mobility  Bed Mobility Overal bed mobility: Needs Assistance Bed Mobility: Supine to Sit     Supine to sit: Contact guard, HOB elevated, Used rails     General bed mobility comments: CGA with use of bed features    Transfers Overall transfer level:  Needs assistance Equipment used: Rolling walker (2 wheels) Transfers: Sit to/from Stand Sit to Stand: Contact guard assist           General transfer comment: CGA for safety, increased time to initiate but no assist needed to rise    Ambulation/Gait Ambulation/Gait assistance: Mod assist, Min assist Gait Distance (Feet): 55 Feet Assistive device: Rolling walker (2 wheels) Gait Pattern/deviations: Step-through pattern, Wide base of support, Trunk flexed, Decreased stride length Gait velocity: reduced     General Gait Details: A for balance and RW management, decreased attention to R needing anticapatory cues to avoid obstacles.   Stairs             Wheelchair Mobility     Tilt Bed    Modified Rankin (Stroke Patients Only) Modified Rankin (Stroke Patients Only) Pre-Morbid Rankin Score: No symptoms Modified Rankin: Moderately severe disability     Balance Overall balance assessment: Needs assistance Sitting-balance support: Feet supported Sitting balance-Leahy Scale: Fair Sitting balance - Comments: flexed posture with poor core strength, though sitting unsupported, no LOB   Standing balance support: Bilateral upper extremity supported Standing balance-Leahy Scale: Poor Standing balance comment: UE support needed for balance                            Cognition Arousal: Alert Behavior During Therapy: Flat affect Overall Cognitive Status: Impaired/Different from baseline Area of Impairment: Attention, Following commands, Safety/judgement, Awareness, Problem solving                   Current Attention Level: Sustained   Following Commands: Follows one step commands consistently, Follows one step commands with increased  time Safety/Judgement: Decreased awareness of safety Awareness: Emergent Problem Solving: Slow processing, Difficulty sequencing, Requires verbal cues, Requires tactile cues, Decreased initiation          Exercises  Other Exercises Other Exercises: pt declining further exercise or gait trials due to fatigue    General Comments General comments (skin integrity, edema, etc.): VSS      Pertinent Vitals/Pain Pain Assessment Pain Assessment: No/denies pain Pain Intervention(s): Monitored during session    Home Living                          Prior Function            PT Goals (current goals can now be found in the care plan section) Acute Rehab PT Goals Patient Stated Goal: none stated PT Goal Formulation: With patient Time For Goal Achievement: 02/17/23 Progress towards PT goals: Progressing toward goals    Frequency    Min 1X/week      PT Plan      Co-evaluation              AM-PAC PT "6 Clicks" Mobility   Outcome Measure  Help needed turning from your back to your side while in a flat bed without using bedrails?: A Little Help needed moving from lying on your back to sitting on the side of a flat bed without using bedrails?: A Little Help needed moving to and from a bed to a chair (including a wheelchair)?: A Little Help needed standing up from a chair using your arms (e.g., wheelchair or bedside chair)?: A Little Help needed to walk in hospital room?: A Lot Help needed climbing 3-5 steps with a railing? : Total 6 Click Score: 15    End of Session Equipment Utilized During Treatment: Gait belt Activity Tolerance: Patient tolerated treatment well Patient left: with call bell/phone within reach;in chair;with chair alarm set Nurse Communication: Mobility status PT Visit Diagnosis: Other abnormalities of gait and mobility (R26.89);Muscle weakness (generalized) (M62.81);Other symptoms and signs involving the nervous system (R29.898)     Time: 1011-1030 PT Time Calculation (min) (ACUTE ONLY): 19 min  Charges:    $Gait Training: 8-22 mins PT General Charges $$ ACUTE PT VISIT: 1 Visit                     Nicole Hafley R. PTA Acute Rehabilitation Services Office:  [phone]   Catalina Antigua 02/10/2023, 11:53 AM

## 2023-03-09 ENCOUNTER — Emergency Department (HOSPITAL_COMMUNITY): Payer: 59

## 2023-03-09 ENCOUNTER — Inpatient Hospital Stay (HOSPITAL_COMMUNITY)
Admission: EM | Admit: 2023-03-09 | Discharge: 2023-03-16 | DRG: 100 | Disposition: A | Discharge status: Home Health | Payer: 59 | Attending: Internal Medicine | Admitting: Internal Medicine

## 2023-03-09 ENCOUNTER — Observation Stay (HOSPITAL_COMMUNITY): Payer: 59

## 2023-03-09 DIAGNOSIS — G9340 Encephalopathy, unspecified: Secondary | ICD-10-CM

## 2023-03-09 DIAGNOSIS — N183 Chronic kidney disease, stage 3 unspecified: Secondary | ICD-10-CM | POA: Diagnosis present

## 2023-03-09 DIAGNOSIS — I4891 Unspecified atrial fibrillation: Secondary | ICD-10-CM

## 2023-03-09 DIAGNOSIS — I4510 Unspecified right bundle-branch block: Secondary | ICD-10-CM | POA: Diagnosis present

## 2023-03-09 DIAGNOSIS — B9789 Other viral agents as the cause of diseases classified elsewhere: Secondary | ICD-10-CM | POA: Diagnosis present

## 2023-03-09 DIAGNOSIS — I5032 Chronic diastolic (congestive) heart failure: Secondary | ICD-10-CM

## 2023-03-09 DIAGNOSIS — Z79899 Other long term (current) drug therapy: Secondary | ICD-10-CM

## 2023-03-09 DIAGNOSIS — G9341 Metabolic encephalopathy: Secondary | ICD-10-CM | POA: Diagnosis present

## 2023-03-09 DIAGNOSIS — R569 Unspecified convulsions: Secondary | ICD-10-CM | POA: Diagnosis not present

## 2023-03-09 DIAGNOSIS — I480 Paroxysmal atrial fibrillation: Secondary | ICD-10-CM

## 2023-03-09 DIAGNOSIS — F1010 Alcohol abuse, uncomplicated: Secondary | ICD-10-CM | POA: Diagnosis present

## 2023-03-09 DIAGNOSIS — I5020 Unspecified systolic (congestive) heart failure: Secondary | ICD-10-CM | POA: Diagnosis present

## 2023-03-09 DIAGNOSIS — G40909 Epilepsy, unspecified, not intractable, without status epilepticus: Principal | ICD-10-CM | POA: Diagnosis present

## 2023-03-09 DIAGNOSIS — I130 Hypertensive heart and chronic kidney disease with heart failure and stage 1 through stage 4 chronic kidney disease, or unspecified chronic kidney disease: Secondary | ICD-10-CM | POA: Diagnosis present

## 2023-03-09 DIAGNOSIS — G8191 Hemiplegia, unspecified affecting right dominant side: Secondary | ICD-10-CM | POA: Diagnosis present

## 2023-03-09 DIAGNOSIS — Z7901 Long term (current) use of anticoagulants: Secondary | ICD-10-CM

## 2023-03-09 DIAGNOSIS — R2990 Unspecified symptoms and signs involving the nervous system: Principal | ICD-10-CM

## 2023-03-09 DIAGNOSIS — R29810 Facial weakness: Secondary | ICD-10-CM | POA: Diagnosis present

## 2023-03-09 DIAGNOSIS — R414 Neurologic neglect syndrome: Secondary | ICD-10-CM | POA: Diagnosis present

## 2023-03-09 DIAGNOSIS — Z7982 Long term (current) use of aspirin: Secondary | ICD-10-CM

## 2023-03-09 DIAGNOSIS — R471 Dysarthria and anarthria: Secondary | ICD-10-CM | POA: Diagnosis present

## 2023-03-09 DIAGNOSIS — R4701 Aphasia: Secondary | ICD-10-CM | POA: Diagnosis present

## 2023-03-09 DIAGNOSIS — Z87891 Personal history of nicotine dependence: Secondary | ICD-10-CM

## 2023-03-09 DIAGNOSIS — Z8249 Family history of ischemic heart disease and other diseases of the circulatory system: Secondary | ICD-10-CM

## 2023-03-09 DIAGNOSIS — N1831 Chronic kidney disease, stage 3a: Secondary | ICD-10-CM | POA: Diagnosis present

## 2023-03-09 DIAGNOSIS — G928 Other toxic encephalopathy: Secondary | ICD-10-CM

## 2023-03-09 DIAGNOSIS — G8384 Todd's paralysis (postepileptic): Secondary | ICD-10-CM | POA: Diagnosis present

## 2023-03-09 DIAGNOSIS — F1090 Alcohol use, unspecified, uncomplicated: Secondary | ICD-10-CM | POA: Diagnosis present

## 2023-03-09 LAB — CBC
HCT: 37.6 % — ABNORMAL LOW (ref 39.0–52.0)
Hemoglobin: 12.3 g/dL — ABNORMAL LOW (ref 13.0–17.0)
MCH: 28.9 pg (ref 26.0–34.0)
MCHC: 32.7 g/dL (ref 30.0–36.0)
MCV: 88.3 fL (ref 80.0–100.0)
Platelets: 191 10*3/uL (ref 150–400)
RBC: 4.26 MIL/uL (ref 4.22–5.81)
RDW: 14.6 % (ref 11.5–15.5)
WBC: 8.8 10*3/uL (ref 4.0–10.5)
nRBC: 0 % (ref 0.0–0.2)

## 2023-03-09 LAB — COMPREHENSIVE METABOLIC PANEL
ALT: 12 U/L (ref 0–44)
AST: 20 U/L (ref 15–41)
Albumin: 3.6 g/dL (ref 3.5–5.0)
Alkaline Phosphatase: 83 U/L (ref 38–126)
Anion gap: 11 (ref 5–15)
BUN: 15 mg/dL (ref 8–23)
CO2: 19 mmol/L — ABNORMAL LOW (ref 22–32)
Calcium: 9.4 mg/dL (ref 8.9–10.3)
Chloride: 110 mmol/L (ref 98–111)
Creatinine, Ser: 1.08 mg/dL (ref 0.61–1.24)
GFR, Estimated: >60 mL/min (ref >60)
Glucose, Bld: 98 mg/dL (ref 70–99)
Potassium: 4.2 mmol/L (ref 3.5–5.1)
Sodium: 140 mmol/L (ref 135–145)
Total Bilirubin: 0.7 mg/dL (ref 0.0–1.2)
Total Protein: 6.7 g/dL (ref 6.5–8.1)

## 2023-03-09 LAB — I-STAT CHEM 8, ED
BUN: 16 mg/dL (ref 8–23)
Calcium, Ion: 1.07 mmol/L — ABNORMAL LOW (ref 1.15–1.40)
Chloride: 112 mmol/L — ABNORMAL HIGH (ref 98–111)
Creatinine, Ser: 1.1 mg/dL (ref 0.61–1.24)
Glucose, Bld: 95 mg/dL (ref 70–99)
HCT: 40 % (ref 39.0–52.0)
Hemoglobin: 13.6 g/dL (ref 13.0–17.0)
Potassium: 4.1 mmol/L (ref 3.5–5.1)
Sodium: 139 mmol/L (ref 135–145)
TCO2: 18 mmol/L — ABNORMAL LOW (ref 22–32)

## 2023-03-09 LAB — APTT: aPTT: 29 s (ref 24–36)

## 2023-03-09 LAB — DIFFERENTIAL
Abs Immature Granulocytes: 0.04 10*3/uL (ref 0.00–0.07)
Basophils Absolute: 0 10*3/uL (ref 0.0–0.1)
Basophils Relative: 0 %
Eosinophils Absolute: 0.2 10*3/uL (ref 0.0–0.5)
Eosinophils Relative: 2 %
Immature Granulocytes: 1 %
Lymphocytes Relative: 17 %
Lymphs Abs: 1.5 10*3/uL (ref 0.7–4.0)
Monocytes Absolute: 1 10*3/uL (ref 0.1–1.0)
Monocytes Relative: 11 %
Neutro Abs: 6.1 10*3/uL (ref 1.7–7.7)
Neutrophils Relative %: 69 %

## 2023-03-09 LAB — AMMONIA: Ammonia: 23 umol/L (ref 9–35)

## 2023-03-09 LAB — CBG MONITORING, ED: Glucose-Capillary: 88 mg/dL (ref 70–99)

## 2023-03-09 LAB — TSH: TSH: 1.305 u[IU]/mL (ref 0.350–4.500)

## 2023-03-09 LAB — HIV ANTIBODY (ROUTINE TESTING W REFLEX): HIV Screen 4th Generation wRfx: NONREACTIVE

## 2023-03-09 LAB — PROTIME-INR
INR: 1.1 (ref 0.8–1.2)
Prothrombin Time: 14 s (ref 11.4–15.2)

## 2023-03-09 LAB — CK: Total CK: 37 U/L — ABNORMAL LOW (ref 49–397)

## 2023-03-09 LAB — ETHANOL: Alcohol, Ethyl (B): <10 mg/dL (ref <10)

## 2023-03-09 LAB — MAGNESIUM: Magnesium: 1.6 mg/dL — ABNORMAL LOW (ref 1.7–2.4)

## 2023-03-09 LAB — VITAMIN B12: Vitamin B-12: 597 pg/mL (ref 180–914)

## 2023-03-09 MED ORDER — LORAZEPAM 2 MG/ML IJ SOLN
2.0000 mg | Freq: Once | INTRAMUSCULAR | Status: AC
  Administered 2023-03-09: 2 mg via INTRAVENOUS

## 2023-03-09 MED ORDER — LORAZEPAM 2 MG/ML IJ SOLN
INTRAMUSCULAR | Status: AC
  Filled 2023-03-09: qty 1

## 2023-03-09 MED ORDER — ACETAMINOPHEN 650 MG RE SUPP: 650.0000 mg | Freq: Four times a day (QID) | RECTAL | Status: DC | PRN

## 2023-03-09 MED ORDER — ORAL CARE MOUTH RINSE
15.0000 mL | OROMUCOSAL | Status: DC
  Administered 2023-03-10 – 2023-03-11 (×6): 15 mL via OROMUCOSAL

## 2023-03-09 MED ORDER — SODIUM CHLORIDE 0.9 % IV SOLN
2500.0000 mg | Freq: Once | INTRAVENOUS | Status: AC
  Administered 2023-03-09: 2500 mg via INTRAVENOUS
  Filled 2023-03-09: qty 25

## 2023-03-09 MED ORDER — SODIUM CHLORIDE 0.9% FLUSH: 3.0000 mL | Freq: Once | INTRAVENOUS | Status: DC

## 2023-03-09 MED ORDER — METOPROLOL TARTRATE 5 MG/5ML IV SOLN
5.0000 mg | Freq: Four times a day (QID) | INTRAVENOUS | Status: DC | PRN
  Administered 2023-03-10: 5 mg via INTRAVENOUS
  Filled 2023-03-09: qty 5

## 2023-03-09 MED ORDER — ONDANSETRON HCL 4 MG/2ML IJ SOLN
4.0000 mg | Freq: Four times a day (QID) | INTRAMUSCULAR | Status: DC | PRN
Start: 2023-03-09 — End: 2023-03-16

## 2023-03-09 MED ORDER — IOHEXOL 350 MG/ML SOLN
100.0000 mL | Freq: Once | INTRAVENOUS | Status: AC | PRN
  Administered 2023-03-09: 100 mL via INTRAVENOUS

## 2023-03-09 MED ORDER — ACETAMINOPHEN 325 MG PO TABS
650.0000 mg | ORAL_TABLET | Freq: Four times a day (QID) | ORAL | Status: DC | PRN
  Administered 2023-03-11 – 2023-03-12 (×2): 650 mg via ORAL
  Filled 2023-03-09 (×2): qty 2

## 2023-03-09 MED ORDER — IOHEXOL 350 MG/ML SOLN
1.0000 mL | Freq: Once | INTRAVENOUS | Status: AC | PRN
  Administered 2023-03-09: 1 mL via INTRAVENOUS

## 2023-03-09 MED ORDER — LEVETIRACETAM IN NACL 1500 MG/100ML IV SOLN
1500.0000 mg | Freq: Two times a day (BID) | INTRAVENOUS | Status: DC
  Administered 2023-03-09 – 2023-03-10 (×2): 1500 mg via INTRAVENOUS
  Filled 2023-03-09 (×4): qty 100

## 2023-03-09 MED ORDER — ONDANSETRON HCL 4 MG PO TABS: 4.0000 mg | ORAL_TABLET | Freq: Four times a day (QID) | ORAL | Status: DC | PRN

## 2023-03-09 MED ORDER — ORAL CARE MOUTH RINSE: 15.0000 mL | OROMUCOSAL | Status: DC | PRN

## 2023-03-09 MED ORDER — LORAZEPAM 2 MG/ML IJ SOLN
1.0000 mg | Freq: Once | INTRAMUSCULAR | Status: AC | PRN
  Administered 2023-03-12: 1 mg via INTRAVENOUS
  Filled 2023-03-09 (×2): qty 1

## 2023-03-09 NOTE — Progress Notes (Signed)
 Same-day progress note  Stat EEG with left-sided slowing only.  No seizures.  Plan as in the consult note from before.   Plan relayed to Drs. Freida Busman and [employer] in ER   -- Milon Dikes, MD Neurologist [facility]

## 2023-03-09 NOTE — ED Notes (Signed)
 1453 Pt started to sz with tonic clonic activity noted. Pt's head and gaze turned to the R side.

## 2023-03-09 NOTE — Code Documentation (Signed)
 Stroke Response Nurse Documentation Code Documentation  John Spears is a 73 y.o. male arriving to [facility]  via [facility] on 03/09/2023 with past medical hx of ETOH abuse, heart failure, HTN, seizures. On No antithrombotic. Code stroke was activated by ED.   Patient from home where he was LKW at 0900 and now complaining of facial droop, left side flaccid, confusion, AMS. Per EMS, patient had right sided weakness, slurred speech, and altered mental status.  Stroke team at the bedside on patient arrival. Labs drawn and patient cleared for CT by Dr. Karoline. Patient to CT with team. NIHSS 18, see documentation for details and code stroke times. Patient with disoriented, not following commands, left gaze preference , right hemianopia, right facial droop, right arm weakness, right leg weakness, right decreased sensation, Global aphasia , dysarthria , and right neglect on exam. The following imaging was completed:  CT Head, CTA, and CTP. Patient is not a candidate for IV Thrombolytic due to out of window per MD. Patient is not a candidate for IR due to no LVO noted on imaging per MD.   Care Plan: VS/NIHSS q2hr x12hr, then q4hr.   Bedside handoff with ED RN Gwynda Annabella DELENA Tess  Stroke Response RN

## 2023-03-09 NOTE — Assessment & Plan Note (Addendum)
 Euvolemic Echo 5/24: normal EF with grade 1 DD (previous EF of 30-35%)  Continue medical management  Daily weights

## 2023-03-09 NOTE — Assessment & Plan Note (Signed)
 Creatinine normal Follow

## 2023-03-09 NOTE — Consult Note (Signed)
 NEUROLOGY CONSULT NOTE   Date of service: March 09, 2023 Patient Name: John Spears MRN:  [id number] DOB:  [birth year] Chief Complaint: CODE STROKE Requesting Provider: No att. providers found  History of Present Illness  John Spears is a 73 y.o. male  has a past medical history of Alcohol abuse, Heart failure with improved ejection fraction (HFimpEF) (HCC) (02/2022), Hypertension, Seizure (HCC), and Seizure disorder (HCC).   Who was BIB EMS as a CODE STROKE due to right-sided weakness, slurred speech and altered mental status. Per EMS, patient takes aspirin  at home and has a history of stroke.  On chart review, patient was prescribed Eliquis , but this was not seen by EMS personnel in their review of his medications at his house.  On the way to the hospital, he had a 30second episode of seizure involving right facial twitching  His exam was consistent with right-sided neglect and right hemiparesis. Taken for emergent [facility] and CT angio head and neck-no emergent findings.  Per sister, John Spears, who did not seem to be a very good historian, he has a walker but does not use it. She helps him with his finances and just gives him his bubble pack of medications but does not know what medications are in it.    After the code stroke imaging, had another brief seizure with rightward gaze and right facial twitching.   LKW: 0 900 Modified rankin score: 4-Needs assistance to walk and tend to bodily needs IV Thrombolysis: Outside the window EVT: Likely seizure, also very poor baseline functional status  NIHSS components Score: Comment  1a Level of Conscious 0[x]  1[]  2[]  3[]      1b LOC Questions 0[]  1[x]  2[]       1c LOC Commands 0[]  1[]  2[x]       2 Best Gaze 0[]  1[x]  2[]       3 Visual 0[]  1[x]  2[]  3[]      4 Facial Palsy 0[]  1[x]  2[]  3[]      5a Motor Arm - left 0[x]  1[]  2[]  3[]  4[]  UN[]    5b Motor Arm - Right 0[]  1[]  2[]  3[x]  4[]  UN[]    6a Motor Leg - Left 0[x]  1[]  2[]  3[]  4[]  UN[]     6b Motor Leg - Right 0[]  1[]  2[]  3[x]  4[x]  UN[]    7 Limb Ataxia 0[]  1[]  2[]  3[]  UN[x]     8 Sensory 0[x]  1[]  2[]  UN[]      9 Best Language 0[]  1[x]  2[]  3[]      10 Dysarthria 0[]  1[x]  2[]  UN[]      11 Extinct. and Inattention 0[]  1[]  2[]       TOTAL: 11      ROS   Unable to ascertain due to AMS  Past History   Past Medical History:  Diagnosis Date   Alcohol abuse    Heart failure with improved ejection fraction (HFimpEF) (HCC) 02/2022   a. 02/2019 Echo: EF 60-65%, GrI DD; b. 01/2022 Echo: EF 30-35%; c. 06/2022 Echo: EF 60-65%, mild LVH, RI DD, nl RV fxn, triv MR.   Hypertension    Seizure (HCC)    Seizure disorder [facility])     Past Surgical History:  Procedure Laterality Date   JOINT REPLACEMENT      Family History: Family History  Problem Relation Age of Onset   Heart attack Mother 21    Social History  reports that he has quit smoking. He has never used smokeless tobacco. He reports that he does not currently use alcohol after a past usage of about  84.0 standard drinks of alcohol per week. He reports that he does not currently use drugs after having used the following drugs: Marijuana.  No Known Allergies  Medications   Current Facility-Administered Medications:    sodium chloride  flush (NS) 0.9 % injection 3 mL, 3 mL, Intravenous, Once, [place], MD  Current Outpatient Medications:    acetaminophen  (TYLENOL ) 160 MG/5ML solution, Take 20.3 mLs (650 mg total) by mouth every 6 (six) hours as needed for fever (Temp >100.4)., Disp: , Rfl:    amLODipine  (NORVASC ) 10 MG tablet, Take 10 mg by mouth at bedtime., Disp: , Rfl:    apixaban  (ELIQUIS ) 5 MG TABS tablet, Take 1 tablet (5 mg total) by mouth 2 (two) times daily., Disp: 60 tablet, Rfl: 1   atorvastatin  (LIPITOR) 40 MG tablet, Take 40 mg by mouth at bedtime., Disp: , Rfl:    carvedilol  (COREG ) 25 MG tablet, Take 25 mg by mouth in the morning and at bedtime., Disp: , Rfl:    cyanocobalamin  (VITAMIN B12) 1000 MCG  tablet, Take 1,000 mcg by mouth in the morning., Disp: , Rfl:    feeding supplement (ENSURE ENLIVE / ENSURE PLUS) LIQD, Take 237 mLs by mouth 2 (two) times daily between meals. (Patient taking differently: Take 237 mLs by mouth 2 (two) times daily as needed.), Disp: , Rfl:    folic acid  (FOLVITE ) 1 MG tablet, Take 1 mg by mouth in the morning., Disp: , Rfl:    furosemide  (LASIX ) 20 MG tablet, TAKE 1 TABLET BY MOUTH ONCE A DAY (Patient taking differently: Take 20 mg by mouth in the morning.), Disp: 90 tablet, Rfl: 3   levETIRAcetam  (KEPPRA ) 750 MG tablet, Take 2 tablets (1,500 mg total) by mouth 2 (two) times daily., Disp: 120 tablet, Rfl: 3   magnesium  oxide (MAG-OX) 400 (240 Mg) MG tablet, Take 1 tablet by mouth in the morning., Disp: , Rfl:    Multiple Vitamin (MULTIVITAMIN WITH MINERALS) TABS tablet, Take 1 tablet by mouth daily. (Patient taking differently: Take 1 tablet by mouth at bedtime.), Disp: , Rfl:    tamsulosin  (FLOMAX ) 0.4 MG CAPS capsule, Take 0.4 mg by mouth at bedtime., Disp: , Rfl:    thiamine  (VITAMIN B1) 100 MG tablet, Take 100 mg by mouth in the morning., Disp: , Rfl:   Vitals   Vitals:   03/09/23 1400  Weight: 79.4 kg    Body mass index is 24.41 kg/m.  Physical Exam   General: He is awake alert does not appear to be in distress HEENT: Normocephalic atraumatic Lungs clear Cardiovascular: Regular rhythm Abdomen nondistended nontender Neurological exam He appears to be awake alert he is able to tell me his name and a very dysarthric tone He is able to name some simple objects on the stroke cards but not able to describe the photo.  He is also not able to read the words on the chart. He got his age and month wrong. Cranial nerves: Pupils are equal round reactive to light he has a leftward gaze preference and appears to be neglecting the right side, he does not blink to threat from the right, blinks test from the left, has a slight right facial droop. Motor examination  with maybe 1/5 right upper and lower extremity.  Left side is full strength and he is able to keep both arm and leg on the left antigravity with a lot of coaching. Sensation appears to be intact on both sides but he seems to be neglecting the left  side visually Coordination: Difficult to assess  Labs/Imaging/Neurodiagnostic studies   CBC:  Recent Labs  Lab 03/21/2023 1426  HGB 13.6  HCT 40.0   Basic Metabolic Panel:  Lab Results  Component Value Date   NA 139 March 21, 2023   K 4.1 03-21-23   CO2 23 02/10/2023   GLUCOSE 95 March 21, 2023   BUN 16 2023-03-21   CREATININE 1.10 03-21-23   CALCIUM  9.2 02/10/2023   GFRNONAA >60 02/10/2023   GFRAA >60 04/04/2019   Lipid Panel:  Lab Results  Component Value Date   LDLCALC 133 (H) 03/08/2019   HgbA1c:  Lab Results  Component Value Date   HGBA1C 5.3 01/26/2023   Urine Drug Screen:     Component Value Date/Time   LABOPIA NONE DETECTED 01/27/2023 1800   COCAINSCRNUR NONE DETECTED 01/27/2023 1800   COCAINSCRNUR NONE DETECTED 02/20/2022 1652   LABBENZ POSITIVE (A) 01/27/2023 1800   AMPHETMU NONE DETECTED 01/27/2023 1800   THCU NONE DETECTED 01/27/2023 1800   LABBARB POSITIVE (A) 01/27/2023 1800    Alcohol Level     Component Value Date/Time   ETH <10 01/26/2023 0954   INR  Lab Results  Component Value Date   INR 1.1 01/26/2023   APTT  Lab Results  Component Value Date   APTT 24 01/26/2023   AED levels:  Lab Results  Component Value Date   PHENYTOIN  11.9 02/02/2023    CT Head without contrast(Personally reviewed): No acute changes  CT angio Head and Neck with contrast, CT perfusion study done because of the concern for left MCA stroke (Personally reviewed): No ELVO, negative perfusion study  Neurodiagnostics rEEG:  Ordered stat  ASSESSMENT   John Spears is a 73 y.o. male  has a past medical history of Alcohol abuse, Heart failure with improved ejection fraction (HFimpEF) (HCC) (02/2022),  Hypertension, Seizure (HCC), and Seizure disorder (HCC).  Presented with concern for strokelike symptoms with witnessed seizure in the ambulance followed by another seizure while in the ED. 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 Obtain stat EEG.  If there is any evidence of ongoing seizures, he will need a second AED.  Given his prior history of alcohol abuse, can load with phenobarbital  20 mg/kg x 1 followed by phenobarbital  64 mg twice daily. Plan was relayed to Dr. Melvenia Patient will need hospitalization.  Neurology will continue ______________________________________________________________________   Pt seen by Neuro NP/APP and later by MD. Note/plan to be edited by MD as needed.    Rocky JAYSON Likes, DNP, AGACNP-BC [facility] Please use AMION for contact information & EPIC for messaging.  Attending Neurohospitalist Addendum Patient seen and examined with APP/Resident. Agree with the history and physical as documented above. Agree with the plan as documented, which I helped formulate. I have independently reviewed the chart, obtained history, review of systems and examined the patient.I have personally reviewed pertinent head/neck/spine imaging (CT/MRI). Please feel free to call with any questions.  -- Eligio Lav, MD Neurologist [facility] Pager: [phone]   CRITICAL CARE ATTESTATION Performed by: Eligio Lav, MD Total critical care time: 45 minutes Critical care time was exclusive of separately billable procedures and treating other patients and/or supervising APPs/Residents/Students Critical care was necessary to treat or prevent imminent or life-threatening  deterioration. This patient is critically ill and at significant risk for neurological worsening and/or death and care requires constant monitoring. Critical care was time spent personally by me on the following activities: development of treatment plan with patient and/or surrogate as well as nursing, discussions with consultants, evaluation of patient's response to treatment, examination of patient, obtaining history from patient or surrogate, ordering and performing treatments and interventions, ordering and review of laboratory studies, ordering and review of radiographic studies, pulse oximetry, re-evaluation of patient's condition, participation in multidisciplinary rounds and medical decision making of high complexity in the care of this patient.

## 2023-03-09 NOTE — Assessment & Plan Note (Addendum)
 CHA2DS2VASC score 5 Continue coreg and eliquis  Took his eliquis this AM around 9:30 am.  If doesn't pass swallow screen, consider heparin gtt

## 2023-03-09 NOTE — Assessment & Plan Note (Addendum)
 73 year old male with known seizure history presenting as code stroke, but with witnessed seizure at home and here in ED. Unsure how many seizures he may have had as in his room for 2.5 hours until sister checked on him  -obs to progressive  -neurology consulted, stat EEG done in ED with no status -unsure precipitating factors. Sister reports he has taken his medication, no recent illness -check UA, CXR for underlying infection  -check labs -CT/CTA negative for acute stroke  -neurology following -loaded with keppra , recommended 1500mg  BID (home dose)  -no hx of alcohol since 12/3  -seizure precautions  -NPO until swallow screen  -PT/OT/Sw. Sister is unsure she can continue taking care of him   -brain MRI if no improvement

## 2023-03-09 NOTE — H&P (Signed)
 History and Physical    Patient: John Spears FMW:[id number] DOB: [birth year] DOA: 03/09/2023 DOS: the patient was seen and examined on 03/09/2023 PCP: Patient, No Pcp Per  Patient coming from: Home - lives with his sister. Ambulates independently    Chief Complaint: seizure   HPI: Trever Streater is a 73 y.o. male with medical history significant of alcohol abuse, diastolic CHF, HTN, seizure disorder, hx of CVA, PAF on eliquis , CKD stage 3 who presented with stroke like symptoms.  He presented with right sided weakness, facial droop, aphasia and confusion. Code stroke was initiated and he ended up having a seizure while here in ED.   His sister states he took his morning pills and ate breakfast around 9AM. He went back to his room. She went to check him around noon. He was sitting in his chair. He had his shoes on, but couldn't tie them. He couldn't put his jacket on. His mouth was twisted and his hands were stiff. He also seemed confused. She thought he had a stroke. He couldn't get out of chair. He had a seizure with the paramedics in the house. He got stiff in the mouth and drooling and moaning. He wouldn't respond. When EMS moved him he was dragging his left leg. He was very confused.   Admitted 01/26/23 to ICU with status epilepticus, respiratory failure with hypoxia from aspiration in setting of alcohol use.   Sister states he was in rehab and moved back with her on 02/25/23. He has been doing okay, but has been slightly confused vs. Memory loss since he came home. She denies any recent illness. She monitors his medication twice a day and has been taking his seizure medication. She didn't watch him last night, but otherwise he takes them. Took his medication this AM. She is unsure what medications he takes as they come packaged.   He does not smoke or drink alcohol. Sister reports he has not drank since he was admitted on 01/26/23.   ER Course:  vitals: afebrile, bp: 155/74, HR: 89,  RR: 20, oxygen: 100%RA Pertinent labs: hgb: 12.3,  CT head: no acute finding. Old small vessel infarctions of the thalami and cerebral hemispheric white matter. Old left parietal cortical and subcortical infarction.  CTA head/neck: no LVO. PCA stenotic disease bilaterally.  Stat EEG and neurology witnessed. Neurology following. [facility] asked to admit.    Review of Systems: unable to review all systems due to the inability of the patient to answer questions. Past Medical History:  Diagnosis Date   Alcohol abuse    Heart failure with improved ejection fraction (HFimpEF) (HCC) 02/2022   a. 02/2019 Echo: EF 60-65%, GrI DD; b. 01/2022 Echo: EF 30-35%; c. 06/2022 Echo: EF 60-65%, mild LVH, RI DD, nl RV fxn, triv MR.   Hypertension    Seizure (HCC)    Seizure disorder [facility])    Past Surgical History:  Procedure Laterality Date   JOINT REPLACEMENT     Social History:  reports that he has quit smoking. He has never used smokeless tobacco. He reports that he does not currently use alcohol after a past usage of about 84.0 standard drinks of alcohol per week. He reports that he does not currently use drugs after having used the following drugs: Marijuana.  No Known Allergies  Family History  Problem Relation Age of Onset   Heart attack Mother 33    Prior to Admission medications   Medication Sig Start Date End Date Taking? Authorizing  Provider  acetaminophen  (TYLENOL ) 160 MG/5ML solution Take 20.3 mLs (650 mg total) by mouth every 6 (six) hours as needed for fever (Temp >100.4). 02/10/23   Sebastian Toribio GAILS, MD  amLODipine  (NORVASC ) 10 MG tablet Take 10 mg by mouth at bedtime. 01/13/23   [provider]  apixaban  (ELIQUIS ) 5 MG TABS tablet Take 1 tablet (5 mg total) by mouth 2 (two) times daily. 02/10/23   Sebastian Toribio GAILS, MD  atorvastatin  (LIPITOR) 40 MG tablet Take 40 mg by mouth at bedtime.    [provider]  carvedilol  (COREG ) 25 MG tablet Take 25 mg by mouth in the  morning and at bedtime.    [provider]  cyanocobalamin  (VITAMIN B12) 1000 MCG tablet Take 1,000 mcg by mouth in the morning. 11/24/22   [provider]  feeding supplement (ENSURE ENLIVE / ENSURE PLUS) LIQD Take 237 mLs by mouth 2 (two) times daily between meals. Patient taking differently: Take 237 mLs by mouth 2 (two) times daily as needed. 12/30/22   Briana Elgin LABOR, MD  folic acid  (FOLVITE ) 1 MG tablet Take 1 mg by mouth in the morning. 07/16/22   [provider]  furosemide  (LASIX ) 20 MG tablet TAKE 1 TABLET BY MOUTH ONCE A DAY Patient taking differently: Take 20 mg by mouth in the morning. 11/09/22   Vivienne Lonni Ingle, NP  levETIRAcetam  (KEPPRA ) 750 MG tablet Take 2 tablets (1,500 mg total) by mouth 2 (two) times daily. 02/10/23   Sebastian Toribio GAILS, MD  magnesium  oxide (MAG-OX) 400 (240 Mg) MG tablet Take 1 tablet by mouth in the morning. 07/16/22   [provider]  Multiple Vitamin (MULTIVITAMIN WITH MINERALS) TABS tablet Take 1 tablet by mouth daily. Patient taking differently: Take 1 tablet by mouth at bedtime. 12/31/22   Briana Elgin LABOR, MD  tamsulosin  (FLOMAX ) 0.4 MG CAPS capsule Take 0.4 mg by mouth at bedtime.    [provider]  thiamine  (VITAMIN B1) 100 MG tablet Take 100 mg by mouth in the morning. 07/16/22   [provider]    Physical Exam: Vitals:   03/09/23 1515 03/09/23 1530 03/09/23 1545 03/09/23 1600  BP: (!) 160/75 (!) 154/103 (!) 159/131 (!) 153/74  Pulse: 84 83 83 74  Resp: (!) 22 (!) 36 (!) 22 20  Temp:      TempSrc:      SpO2: 100% 100% 100% 100%  Weight:      Height:       General:  Appears calm and comfortable and is in NAD Eyes:  PERRL, EOMI, normal lids, iris ENT:  grossly normal hearing, lips & tongue, mmm; appropriate dentition Neck:  no LAD, masses or thyromegaly; no carotid bruits Cardiovascular:  RRR, no m/r/g. No LE edema.  Respiratory:   CTA bilaterally with no wheezes/rales/rhonchi.   Normal respiratory effort. Abdomen:  soft, NT, ND, NABS Back:   normal alignment, no CVAT Skin:  no rash or induration seen on limited exam Musculoskeletal:  grossly normal tone BUE/BLE, good ROM, no bony abnormality Lower extremity:  No LE edema.  Limited foot exam with no ulcerations.  2+ distal pulses. Psychiatric:  grossly normal mood and affect, speech fluent and appropriate, AOx2 Neurologic:  CN 2-12 grossly intact, moves all extremities spontaneously. Continues leftward gaze, but will turn head to midline. Confused, picking at sheet   Radiological Exams on Admission: Independently reviewed - see discussion in A/P where applicable  DG CHEST PORT 1 VIEW Result Date: 03/09/2023 CLINICAL DATA:  Seizure. EXAM: PORTABLE CHEST 1 VIEW COMPARISON:  01/31/2023 FINDINGS: Normal heart size for technique. No focal airspace disease, pleural effusion or pneumothorax. Normal pulmonary vasculature. No acute osseous findings. IMPRESSION: No acute findings. Electronically Signed   By: Andrea Gasman M.D.   On: 03/09/2023 17:55   EEG adult Result Date: 03/09/2023 Shelton Arlin KIDD, MD     03/09/2023  5:03 PM Patient Name: Jonh Mcqueary MRN: [id number] Epilepsy Attending: Arlin KIDD Shelton Referring Physician/Provider: Judithe Rocky BROCKS, NP Date: 03/09/2023 Duration: 26.11 mins Patient history: 73yo F presented with right sided weakness, facial droop, aphasia and confusion. EEG to evaluate for seizure Level of alertness: Awake AEDs during EEG study: Ativan  Technical aspects: This EEG study was done with scalp electrodes positioned according to the 10-20 International system of electrode placement. Electrical activity was reviewed with band pass filter of 1-70Hz , sensitivity of 7 uV/mm, display speed of 59mm/sec with a 60Hz  notched filter applied as appropriate. EEG data were recorded continuously and digitally stored.  Video monitoring was available and reviewed as appropriate. Description: The posterior dominant  rhythm consists of 8-9 Hz activity of moderate voltage (25-35 uV) seen predominantly in posterior head regions, symmetric and reactive to eye opening and eye closing. EEG showed continuous 2-3Hz  delta slowing in left hemisphere. Hyperventilation and photic stimulation were not performed.   ABNORMALITY - Continuous slow, left hemisphere IMPRESSION: This study is suggestive of cortical dysfunction arising from left hemisphere likely secondary to underlying structural abnormality, post-ictal state. No seizures or epileptiform discharges were seen throughout the recording. Priyanka O Yadav   CT ANGIO HEAD NECK W WO CM W PERF (CODE STROKE) Result Date: 03/09/2023 CLINICAL DATA:  Neuro deficit, acute, stroke suspected. EXAM: CT ANGIOGRAPHY HEAD AND NECK CT PERFUSION BRAIN TECHNIQUE: Multidetector CT imaging of the head and neck was performed using the standard protocol during bolus administration of intravenous contrast. Multiplanar CT image reconstructions and MIPs were obtained to evaluate the vascular anatomy. Carotid stenosis measurements (when applicable) are obtained utilizing NASCET criteria, using the distal internal carotid diameter as the denominator. Multiphase CT imaging of the brain was performed following IV bolus contrast injection. Subsequent parametric perfusion maps were calculated using RAPID software. RADIATION DOSE REDUCTION: This exam was performed according to the departmental dose-optimization program which includes automated exposure control, adjustment of the mA and/or kV according to patient size and/or use of iterative reconstruction technique. CONTRAST:  1mL OMNIPAQUE  IOHEXOL  350 MG/ML SOLN COMPARISON:  Head CT same day FINDINGS: CTA NECK FINDINGS Aortic arch: Aortic atherosclerosis. Branching pattern is normal without origin stenosis. Right carotid system: Common carotid artery shows scattered plaque but is widely patent to the bifurcation. No bifurcation or ICA bulb plaque. Cervical ICA  widely patent. Left carotid system: Common carotid artery widely patent to the bifurcation. Carotid bifurcation is normal. Cervical ICA is normal. Vertebral arteries: Both vertebral artery origins are widely patent. Both vertebral arteries appear normal through the cervical region to the foramen magnum. Skeleton: Mild cervical spondylosis. Other neck: No mass or lymphadenopathy. Upper chest: Lung apices are clear. Review of the MIP images confirms the above findings CTA HEAD FINDINGS Anterior circulation: Both internal carotid arteries are widely patent through the skull base and siphon regions. Ordinary siphon atherosclerotic calcification but no stenosis greater than 30%. The anterior and middle cerebral vessels are patent. No large vessel occlusion or proximal flow limiting stenosis. No aneurysm or vascular malformation. Posterior circulation: Both vertebral arteries patent through the foramen magnum to the basilar artery. No basilar  stenosis. Posterior circulation branch vessels are patent. There is stenotic disease in both posterior cerebral arteries. Venous sinuses: Patent and normal. Anatomic variants: None significant. Review of the MIP images confirms the above findings CT Brain Perfusion Findings: ASPECTS: 10, allowing for the old left parietal infarction. CBF (<30%) Volume: 0mL Perfusion (Tmax>6.0s) volume: 0mL Mismatch Volume: 0mL Infarction Location:None IMPRESSION: 1. No large vessel occlusion or proximal flow limiting stenosis. 2. Aortic atherosclerosis. 3. No carotid bifurcation disease. 4. Posterior cerebral artery stenotic disease bilaterally. 5. Negative perfusion study. Aortic Atherosclerosis (ICD10-I70.0). Electronically Signed   By: Oneil Officer M.D.   On: 03/09/2023 14:50   CT HEAD CODE STROKE WO CONTRAST Result Date: 03/09/2023 CLINICAL DATA:  Code stroke. Neuro deficit, acute, stroke suspected. EXAM: CT HEAD WITHOUT CONTRAST TECHNIQUE: Contiguous axial images were obtained from the base  of the skull through the vertex without intravenous contrast. RADIATION DOSE REDUCTION: This exam was performed according to the departmental dose-optimization program which includes automated exposure control, adjustment of the mA and/or kV according to patient size and/or use of iterative reconstruction technique. COMPARISON:  01/26/2023 FINDINGS: Brain: No focal abnormality seen affecting the brainstem or cerebellum. Old small vessel infarctions affect the thalami and cerebral hemispheric white matter. Old left parietal cortical and subcortical infarction. No evidence of acute infarction, mass lesion, hemorrhage, hydrocephalus or extra-axial collection. Vascular: There is atherosclerotic calcification of the major vessels at the base of the brain. Skull: Negative Sinuses/Orbits: Clear sinuses. Old medial orbital blowout fracture on the right. Other: None ASPECTS ([place]berta Stroke Program Early CT Score) - Ganglionic level infarction (caudate, lentiform nuclei, internal capsule, insula, M1-M3 cortex): 7, allowing for the old infarction. - Supraganglionic infarction (M4-M6 cortex): 3, allowing the old infarction. Total score (0-10 with 10 being normal): 10, allowing for the old infarction. IMPRESSION: 1. No acute CT finding. Old small vessel infarctions of the thalami and cerebral hemispheric white matter. Old left parietal cortical and subcortical infarction. 2. Aspects is 10, allowing for the old infarction. These results were communicated to Dr. Arora at 2:39 pm on 03/09/2023 by text page via the [facility] messaging system. Electronically Signed   By: Oneil Officer M.D.   On: 03/09/2023 14:39    EKG: Independently reviewed.  Sinus tachycardia with rate 102; nonspecific ST changes with no evidence of acute ischemia. RBBB   Labs on Admission: I have personally reviewed the available labs and imaging studies at the time of the admission.  Pertinent labs:   Hgb 12.3   Assessment and Plan: Principal Problem:    Seizure (HCC) Active Problems:   Acute encephalopathy   Paroxysmal atrial fibrillation (HCC)   ETOH abuse   Chronic diastolic CHF (congestive heart failure) (HCC)   CKD (chronic kidney disease), stage III (HCC)    Assessment and Plan: * Seizure (HCC) 73 year old male with known seizure history presenting as code stroke, but with witnessed seizure at home and here in ED. Unsure how many seizures he may have had as in his room for 2.5 hours until sister checked on him  -obs to progressive  -neurology consulted, stat EEG done in ED with no status -unsure precipitating factors. Sister reports he has taken his medication, no recent illness -check UA, CXR for underlying infection  -check labs -CT/CTA negative for acute stroke  -neurology following -loaded with keppra , recommended 1500mg  BID (home dose)  -no hx of alcohol since 12/3  -seizure precautions  -NPO until swallow screen  -PT/OT/Sw. Sister is unsure she can continue  taking care of him   -brain MRI if no improvement   Acute encephalopathy -Likely secondary to seizure -CT head and CTA head/neck with no acute finding  -EEG done with no status  -check UA, CXR for infectious cause -metabolic labs pending  -delirium precautions/fall precautions  -consider MRI if no improvement   Paroxysmal atrial fibrillation (HCC) CHA2DS2VASC score 5 Continue coreg  and eliquis   Took his eliquis  this AM around 9:30 am.  If doesn't pass swallow screen, consider heparin  gtt   ETOH abuse Ethanol negative in ED  Sister states he has not drank since hospitalized December 3rd, 2024  Continue nutriceuticals   Chronic diastolic CHF (congestive heart failure) (HCC) Euvolemic Echo 5/24: normal EF with grade 1 DD (previous EF of 30-35%)  Continue medical management  Daily weights   CKD (chronic kidney disease), stage III (HCC) Creatinine normal Follow     Advance Care Planning:   Code Status: Full Code   Consults: neurology/SW/PT/OT    DVT Prophylaxis: eliquis    Family Communication: updated sister by phone.   Severity of Illness: The appropriate patient status for this patient is OBSERVATION. Observation status is judged to be reasonable and necessary in order to provide the required intensity of service to ensure the patient's safety. The patient's presenting symptoms, physical exam findings, and initial radiographic and laboratory data in the context of their medical condition is felt to place them at decreased risk for further clinical deterioration. Furthermore, it is anticipated that the patient will be medically stable for discharge from the hospital within 2 midnights of admission.   Author: Isaiah Geralds, MD 03/09/2023 7:51 PM  For on call review [email or web address].

## 2023-03-09 NOTE — ED Provider Notes (Signed)
 [place] EMERGENCY DEPARTMENT AT [facility] Provider Note   CSN: [id number] Arrival date & time: 03/09/23  1417     History  Chief Complaint  Patient presents with   Code Stroke    Rhythm Wigfall is a 73 y.o. male.  HPI Patient presents for leg symptoms.  Medical history includes CVA, seizures, CHF, prior alcohol abuse, CKD.  He was admitted to the hospital 6 weeks ago for encephalopathy in the setting of alcohol abuse and seizures.  He was started on Keppra .  He was intubated at admission for seizures and aspiration pneumonia.  He is on Eliquis .  He was discharged 4 weeks ago and went to nursing facility.  He has since returned home, where he lives with his sister.  Sister reports that he is able to do his own ADLs.  He seemed normal to her this morning.  Last known well was 9 AM.  When she went to check on him again, he had right-sided weakness, facial droop, aphasia, and confusion.    Home Medications Prior to Admission medications   Medication Sig Start Date End Date Taking? Authorizing Provider  acetaminophen  (TYLENOL ) 160 MG/5ML solution Take 20.3 mLs (650 mg total) by mouth every 6 (six) hours as needed for fever (Temp >100.4). 02/10/23   Sebastian Toribio GAILS, MD  amLODipine  (NORVASC ) 10 MG tablet Take 10 mg by mouth at bedtime. 01/13/23   [provider]  apixaban  (ELIQUIS ) 5 MG TABS tablet Take 1 tablet (5 mg total) by mouth 2 (two) times daily. 02/10/23   Sebastian Toribio GAILS, MD  atorvastatin  (LIPITOR) 40 MG tablet Take 40 mg by mouth at bedtime.    [provider]  carvedilol  (COREG ) 25 MG tablet Take 25 mg by mouth in the morning and at bedtime.    [provider]  cyanocobalamin  (VITAMIN B12) 1000 MCG tablet Take 1,000 mcg by mouth in the morning. 11/24/22   [provider]  feeding supplement (ENSURE ENLIVE / ENSURE PLUS) LIQD Take 237 mLs by mouth 2 (two) times daily between meals. Patient taking differently: Take 237 mLs  by mouth 2 (two) times daily as needed. 12/30/22   Briana Elgin LABOR, MD  folic acid  (FOLVITE ) 1 MG tablet Take 1 mg by mouth in the morning. 07/16/22   [provider]  furosemide  (LASIX ) 20 MG tablet TAKE 1 TABLET BY MOUTH ONCE A DAY Patient taking differently: Take 20 mg by mouth in the morning. 11/09/22   Vivienne Lonni Ingle, NP  levETIRAcetam  (KEPPRA ) 750 MG tablet Take 2 tablets (1,500 mg total) by mouth 2 (two) times daily. 02/10/23   Sebastian Toribio GAILS, MD  magnesium  oxide (MAG-OX) 400 (240 Mg) MG tablet Take 1 tablet by mouth in the morning. 07/16/22   [provider]  Multiple Vitamin (MULTIVITAMIN WITH MINERALS) TABS tablet Take 1 tablet by mouth daily. Patient taking differently: Take 1 tablet by mouth at bedtime. 12/31/22   Briana Elgin LABOR, MD  tamsulosin  (FLOMAX ) 0.4 MG CAPS capsule Take 0.4 mg by mouth at bedtime.    [provider]  thiamine  (VITAMIN B1) 100 MG tablet Take 100 mg by mouth in the morning. 07/16/22   [provider]      Allergies    Patient has no known allergies.    Review of Systems   Review of Systems  Unable to perform ROS: Mental status change    Physical Exam Updated Vital Signs BP (!) 155/74 (BP Location: Left Arm)  Pulse 89   Temp 98.4 F (36.9 C) (Axillary)   Resp 20   Ht 5' 11 (1.803 m)   Wt 79.4 kg   SpO2 100%   BMI 24.41 kg/m  Physical Exam Vitals and nursing note reviewed.  Constitutional:      General: He is not in acute distress.    Appearance: He is well-developed. He is not toxic-appearing or diaphoretic.  HENT:     Head: Normocephalic and atraumatic.     Right Ear: External ear normal.     Left Ear: External ear normal.     Nose: Nose normal.     Mouth/Throat:     Mouth: Mucous membranes are moist.  Eyes:     Extraocular Movements: Extraocular movements intact.     Conjunctiva/sclera: Conjunctivae normal.  Cardiovascular:     Rate and Rhythm: Normal rate and regular rhythm.   Pulmonary:     Effort: Pulmonary effort is normal. No respiratory distress.  Abdominal:     General: There is no distension.     Palpations: Abdomen is soft.  Musculoskeletal:        General: No swelling.     Cervical back: Neck supple.  Skin:    General: Skin is warm and dry.     Coloration: Skin is not jaundiced or pale.  Neurological:     Mental Status: He is alert.     Comments: Right-sided weakness, right-sided facial droop, right hemineglect, denies sensory loss.  Psychiatric:        Behavior: Behavior normal.     ED Results / Procedures / Treatments   Labs (all labs ordered are listed, but only abnormal results are displayed) Labs Reviewed  CBC - Abnormal; Notable for the following components:      Result Value   Hemoglobin 12.3 (*)    HCT 37.6 (*)    All other components within normal limits  COMPREHENSIVE METABOLIC PANEL - Abnormal; Notable for the following components:   CO2 19 (*)    All other components within normal limits  I-STAT CHEM 8, ED - Abnormal; Notable for the following components:   Chloride 112 (*)    Calcium , Ion 1.07 (*)    TCO2 18 (*)    All other components within normal limits  PROTIME-INR  APTT  DIFFERENTIAL  ETHANOL  CBG MONITORING, ED    EKG EKG Interpretation Date/Time:  Tuesday March 09 2023 14:58:27 EST Ventricular Rate:  102 PR Interval:  185 QRS Duration:  134 QT Interval:  360 QTC Calculation: 469 R Axis:   -72  Text Interpretation: Sinus or ectopic atrial tachycardia RBBB and LAFB Confirmed by Melvenia Motto ([id number]) on 03/09/2023 3:17:29 PM  Radiology CT ANGIO HEAD NECK W WO CM W PERF (CODE STROKE) Result Date: 03/09/2023 CLINICAL DATA:  Neuro deficit, acute, stroke suspected. EXAM: CT ANGIOGRAPHY HEAD AND NECK CT PERFUSION BRAIN TECHNIQUE: Multidetector CT imaging of the head and neck was performed using the standard protocol during bolus administration of intravenous contrast. Multiplanar CT image reconstructions and MIPs  were obtained to evaluate the vascular anatomy. Carotid stenosis measurements (when applicable) are obtained utilizing NASCET criteria, using the distal internal carotid diameter as the denominator. Multiphase CT imaging of the brain was performed following IV bolus contrast injection. Subsequent parametric perfusion maps were calculated using RAPID software. RADIATION DOSE REDUCTION: This exam was performed according to the departmental dose-optimization program which includes automated exposure control, adjustment of the mA and/or kV according to patient size and/or use  of iterative reconstruction technique. CONTRAST:  1mL OMNIPAQUE  IOHEXOL  350 MG/ML SOLN COMPARISON:  Head CT same day FINDINGS: CTA NECK FINDINGS Aortic arch: Aortic atherosclerosis. Branching pattern is normal without origin stenosis. Right carotid system: Common carotid artery shows scattered plaque but is widely patent to the bifurcation. No bifurcation or ICA bulb plaque. Cervical ICA widely patent. Left carotid system: Common carotid artery widely patent to the bifurcation. Carotid bifurcation is normal. Cervical ICA is normal. Vertebral arteries: Both vertebral artery origins are widely patent. Both vertebral arteries appear normal through the cervical region to the foramen magnum. Skeleton: Mild cervical spondylosis. Other neck: No mass or lymphadenopathy. Upper chest: Lung apices are clear. Review of the MIP images confirms the above findings CTA HEAD FINDINGS Anterior circulation: Both internal carotid arteries are widely patent through the skull base and siphon regions. Ordinary siphon atherosclerotic calcification but no stenosis greater than 30%. The anterior and middle cerebral vessels are patent. No large vessel occlusion or proximal flow limiting stenosis. No aneurysm or vascular malformation. Posterior circulation: Both vertebral arteries patent through the foramen magnum to the basilar artery. No basilar stenosis. Posterior  circulation branch vessels are patent. There is stenotic disease in both posterior cerebral arteries. Venous sinuses: Patent and normal. Anatomic variants: None significant. Review of the MIP images confirms the above findings CT Brain Perfusion Findings: ASPECTS: 10, allowing for the old left parietal infarction. CBF (<30%) Volume: 0mL Perfusion (Tmax>6.0s) volume: 0mL Mismatch Volume: 0mL Infarction Location:None IMPRESSION: 1. No large vessel occlusion or proximal flow limiting stenosis. 2. Aortic atherosclerosis. 3. No carotid bifurcation disease. 4. Posterior cerebral artery stenotic disease bilaterally. 5. Negative perfusion study. Aortic Atherosclerosis (ICD10-I70.0). Electronically Signed   By: Oneil Officer M.D.   On: 03/09/2023 14:50   CT HEAD CODE STROKE WO CONTRAST Result Date: 03/09/2023 CLINICAL DATA:  Code stroke. Neuro deficit, acute, stroke suspected. EXAM: CT HEAD WITHOUT CONTRAST TECHNIQUE: Contiguous axial images were obtained from the base of the skull through the vertex without intravenous contrast. RADIATION DOSE REDUCTION: This exam was performed according to the departmental dose-optimization program which includes automated exposure control, adjustment of the mA and/or kV according to patient size and/or use of iterative reconstruction technique. COMPARISON:  01/26/2023 FINDINGS: Brain: No focal abnormality seen affecting the brainstem or cerebellum. Old small vessel infarctions affect the thalami and cerebral hemispheric white matter. Old left parietal cortical and subcortical infarction. No evidence of acute infarction, mass lesion, hemorrhage, hydrocephalus or extra-axial collection. Vascular: There is atherosclerotic calcification of the major vessels at the base of the brain. Skull: Negative Sinuses/Orbits: Clear sinuses. Old medial orbital blowout fracture on the right. Other: None ASPECTS (Alberta Stroke Program Early CT Score) - Ganglionic level infarction (caudate, lentiform  nuclei, internal capsule, insula, M1-M3 cortex): 7, allowing for the old infarction. - Supraganglionic infarction (M4-M6 cortex): 3, allowing the old infarction. Total score (0-10 with 10 being normal): 10, allowing for the old infarction. IMPRESSION: 1. No acute CT finding. Old small vessel infarctions of the thalami and cerebral hemispheric white matter. Old left parietal cortical and subcortical infarction. 2. Aspects is 10, allowing for the old infarction. These results were communicated to Dr. Arora at 2:39 pm on 03/09/2023 by text page via the [facility] messaging system. Electronically Signed   By: Oneil Officer M.D.   On: 03/09/2023 14:39    Procedures Procedures    Medications Ordered in ED Medications  sodium chloride  flush (NS) 0.9 % injection 3 mL (has no administration in time range)  levETIRAcetam  (KEPPRA ) 2,500 mg in sodium chloride  0.9 % 250 mL IVPB (has no administration in time range)  iohexol  (OMNIPAQUE ) 350 MG/ML injection 100 mL (100 mLs Intravenous Contrast Given 03/09/23 1442)  iohexol  (OMNIPAQUE ) 350 MG/ML injection 1 mL (1 mL Intravenous Contrast Given 03/09/23 1446)  LORazepam  (ATIVAN ) injection 2 mg (2 mg Intravenous Given 03/09/23 1458)    ED Course/ Medical Decision Making/ A&P                                 Medical Decision Making Amount and/or Complexity of Data Reviewed Labs: ordered. Radiology: ordered.  Risk Prescription drug management.   This patient presents to the ED for concern of stroke-like symptoms, this involves an extensive number of treatment options, and is a complaint that carries with it a high risk of complications and morbidity.  The differential diagnosis includes CVA, seizure, ICH, neoplasm, metabolic derangements   Co morbidities that complicate the patient evaluation  CVA, seizures, CHF, prior alcohol abuse, CKD   Additional history obtained:  Additional history obtained from N/A External records from outside source obtained and  reviewed including EMR   Lab Tests:  I Ordered, and personally interpreted labs.  The pertinent results include: Normal hemoglobin, no leukocytosis, normal kidney function, normal electrolytes   Imaging Studies ordered:  I ordered imaging studies including CT head, CTA head and neck I independently visualized and interpreted imaging which showed no acute findings I agree with the radiologist interpretation   Cardiac Monitoring: / EKG:  The patient was maintained on a cardiac monitor.  I personally viewed and interpreted the cardiac monitored which showed an underlying rhythm of: Comments rhythm   Consultations Obtained:  I requested consultation with the neurologist, Dr. Voncile,  and discussed lab and imaging findings as well as pertinent plan - they recommend: Stat EEG to be performed in the ED.  Patient will need admission.  Decision for ICU versus floor pending.   Problem List / ED Course / Critical interventions / Medication management  Patient presenting for strokelike symptoms.  Witnessed at home by sister who last saw him well at 9 AM.  On arrival, patient has right facial droop, right weakness, right hemineglect.  He denies sensory loss.  Speech is limited but he is able to tell me his name.  He arrives as a code stroke.  He was taken directly to CT scanner.  Patient underwent CT of head and CTA of head and neck.  No acute findings were identified.  Neurology recommends no acute interventions.  He will require admission.  While in the ED, patient had a witnessed seizure.  He was given 2 mg of Ativan  and 2500 mg of Keppra .  Further neurology recommendations pending.  Care of patient was signed out to oncoming ED provider. I ordered medication including Ativan  and Keppra   for seizure  Reevaluation of the patient after these medicines showed that the patient improved I have reviewed the patients home medicines and have made adjustments as needed   Social Determinants of  Health:  Lives at home with sister  CRITICAL CARE Performed by: Bernardino Fireman   Total critical care time: 32 minutes  Critical care time was exclusive of separately billable procedures and treating other patients.  Critical care was necessary to treat or prevent imminent or life-threatening deterioration.  Critical care was time spent personally by me on the following activities: development of treatment plan with patient  and/or surrogate as well as nursing, discussions with consultants, evaluation of patient's response to treatment, examination of patient, obtaining history from patient or surrogate, ordering and performing treatments and interventions, ordering and review of laboratory studies, ordering and review of radiographic studies, pulse oximetry and re-evaluation of patient's condition.        Final Clinical Impression(s) / ED Diagnoses Final diagnoses:  Stroke-like symptoms  Seizure [facility])    Rx / DC Orders ED Discharge Orders     None         Melvenia Motto, MD 03/09/23 1526

## 2023-03-09 NOTE — ED Notes (Addendum)
 This RN took over pt care at this time. Brief report receiving from stroke response RN.

## 2023-03-09 NOTE — Assessment & Plan Note (Addendum)
-  Likely secondary to seizure -CT head and CTA head/neck with no acute finding  -EEG done with no status  -check UA, CXR for infectious cause -metabolic labs pending  -delirium precautions/fall precautions  -consider MRI if no improvement

## 2023-03-09 NOTE — ED Notes (Addendum)
Previous note at this timestamp documented in error.

## 2023-03-09 NOTE — Progress Notes (Signed)
 EEG complete - results pending

## 2023-03-09 NOTE — Procedures (Signed)
 Patient Name: John Spears  MRN: [id number]  Epilepsy Attending: Arlin MALVA Krebs  Referring Physician/Provider: Judithe Rocky BROCKS, NP  Date: 03/09/2023 Duration: 26.11 mins  Patient history: 73yo F presented with right sided weakness, facial droop, aphasia and confusion. EEG to evaluate for seizure  Level of alertness: Awake  AEDs during EEG study: Ativan   Technical aspects: This EEG study was done with scalp electrodes positioned according to the 10-20 International system of electrode placement. Electrical activity was reviewed with band pass filter of 1-70Hz , sensitivity of 7 uV/mm, display speed of 92mm/sec with a 60Hz  notched filter applied as appropriate. EEG data were recorded continuously and digitally stored.  Video monitoring was available and reviewed as appropriate.  Description: The posterior dominant rhythm consists of 8-9 Hz activity of moderate voltage (25-35 uV) seen predominantly in posterior head regions, symmetric and reactive to eye opening and eye closing. EEG showed continuous 2-3Hz  delta slowing in left hemisphere. Hyperventilation and photic stimulation were not performed.     ABNORMALITY - Continuous slow, left hemisphere  IMPRESSION: This study is suggestive of cortical dysfunction arising from left hemisphere likely secondary to underlying structural abnormality, post-ictal state. No seizures or epileptiform discharges were seen throughout the recording.  Alphonso Gregson O Darol Cush

## 2023-03-09 NOTE — Assessment & Plan Note (Addendum)
 Ethanol negative in ED  Sister states he has not drank since hospitalized December 3rd, 2024  Continue nutriceuticals

## 2023-03-10 ENCOUNTER — Other Ambulatory Visit: Payer: Self-pay

## 2023-03-10 ENCOUNTER — Encounter (HOSPITAL_COMMUNITY): Payer: Self-pay | Admitting: Family Medicine

## 2023-03-10 DIAGNOSIS — R569 Unspecified convulsions: Secondary | ICD-10-CM | POA: Diagnosis not present

## 2023-03-10 LAB — CBC
HCT: 33.9 % — ABNORMAL LOW (ref 39.0–52.0)
Hemoglobin: 11.2 g/dL — ABNORMAL LOW (ref 13.0–17.0)
MCH: 29.1 pg (ref 26.0–34.0)
MCHC: 33 g/dL (ref 30.0–36.0)
MCV: 88.1 fL (ref 80.0–100.0)
Platelets: 192 10*3/uL (ref 150–400)
RBC: 3.85 MIL/uL — ABNORMAL LOW (ref 4.22–5.81)
RDW: 14.6 % (ref 11.5–15.5)
WBC: 9.9 10*3/uL (ref 4.0–10.5)
nRBC: 0 % (ref 0.0–0.2)

## 2023-03-10 LAB — BASIC METABOLIC PANEL
Anion gap: 12 (ref 5–15)
BUN: 11 mg/dL (ref 8–23)
CO2: 20 mmol/L — ABNORMAL LOW (ref 22–32)
Calcium: 9.4 mg/dL (ref 8.9–10.3)
Chloride: 107 mmol/L (ref 98–111)
Creatinine, Ser: 0.98 mg/dL (ref 0.61–1.24)
GFR, Estimated: >60 mL/min (ref >60)
Glucose, Bld: 85 mg/dL (ref 70–99)
Potassium: 3.9 mmol/L (ref 3.5–5.1)
Sodium: 139 mmol/L (ref 135–145)

## 2023-03-10 LAB — URINALYSIS, ROUTINE W REFLEX MICROSCOPIC
Bilirubin Urine: NEGATIVE
Glucose, UA: NEGATIVE mg/dL
Hgb urine dipstick: NEGATIVE
Ketones, ur: 5 mg/dL — AB
Leukocytes,Ua: NEGATIVE
Nitrite: NEGATIVE
Protein, ur: NEGATIVE mg/dL
Specific Gravity, Urine: 1.026 (ref 1.005–1.030)
pH: 5 (ref 5.0–8.0)

## 2023-03-10 MED ORDER — THIAMINE HCL 100 MG PO TABS
100.0000 mg | ORAL_TABLET | Freq: Every morning | ORAL | Status: DC
  Administered 2023-03-11 – 2023-03-16 (×6): 100 mg via ORAL
  Filled 2023-03-10 (×12): qty 1

## 2023-03-10 MED ORDER — APIXABAN 5 MG PO TABS
5.0000 mg | ORAL_TABLET | Freq: Two times a day (BID) | ORAL | Status: DC
  Administered 2023-03-10 – 2023-03-16 (×12): 5 mg via ORAL
  Filled 2023-03-10 (×13): qty 1

## 2023-03-10 MED ORDER — FOLIC ACID 1 MG PO TABS
1.0000 mg | ORAL_TABLET | Freq: Every morning | ORAL | Status: DC
  Administered 2023-03-11 – 2023-03-16 (×6): 1 mg via ORAL
  Filled 2023-03-10 (×7): qty 1

## 2023-03-10 MED ORDER — ADULT MULTIVITAMIN W/MINERALS CH
1.0000 | ORAL_TABLET | Freq: Every day | ORAL | Status: DC
  Administered 2023-03-10 – 2023-03-15 (×6): 1 via ORAL
  Filled 2023-03-10 (×6): qty 1

## 2023-03-10 MED ORDER — ASPIRIN 81 MG PO TBEC
81.0000 mg | DELAYED_RELEASE_TABLET | Freq: Every evening | ORAL | Status: DC
  Administered 2023-03-10 – 2023-03-15 (×6): 81 mg via ORAL
  Filled 2023-03-10 (×6): qty 1

## 2023-03-10 MED ORDER — FUROSEMIDE 20 MG PO TABS
20.0000 mg | ORAL_TABLET | Freq: Every day | ORAL | Status: DC
  Administered 2023-03-10 – 2023-03-16 (×7): 20 mg via ORAL
  Filled 2023-03-10 (×7): qty 1

## 2023-03-10 MED ORDER — TAMSULOSIN HCL 0.4 MG PO CAPS
0.4000 mg | ORAL_CAPSULE | Freq: Every day | ORAL | Status: DC
  Administered 2023-03-10 – 2023-03-15 (×6): 0.4 mg via ORAL
  Filled 2023-03-10 (×6): qty 1

## 2023-03-10 MED ORDER — LEVETIRACETAM IN NACL 1500 MG/100ML IV SOLN
1500.0000 mg | Freq: Two times a day (BID) | INTRAVENOUS | Status: DC
  Administered 2023-03-10 – 2023-03-13 (×6): 1500 mg via INTRAVENOUS
  Filled 2023-03-10 (×7): qty 100

## 2023-03-10 MED ORDER — MAGNESIUM SULFATE 2 GM/50ML IV SOLN
2.0000 g | Freq: Once | INTRAVENOUS | Status: AC
  Administered 2023-03-10: 2 g via INTRAVENOUS
  Filled 2023-03-10: qty 50

## 2023-03-10 MED ORDER — VITAMIN B-12 1000 MCG PO TABS
1000.0000 ug | ORAL_TABLET | Freq: Every morning | ORAL | Status: DC
  Administered 2023-03-11 – 2023-03-16 (×6): 1000 ug via ORAL
  Filled 2023-03-10 (×6): qty 1

## 2023-03-10 MED ORDER — ATORVASTATIN CALCIUM 40 MG PO TABS
40.0000 mg | ORAL_TABLET | Freq: Every day | ORAL | Status: DC
  Administered 2023-03-10 – 2023-03-15 (×6): 40 mg via ORAL
  Filled 2023-03-10 (×6): qty 1

## 2023-03-10 MED ORDER — AMLODIPINE BESYLATE 5 MG PO TABS
10.0000 mg | ORAL_TABLET | Freq: Every day | ORAL | Status: DC
  Administered 2023-03-10 – 2023-03-15 (×6): 10 mg via ORAL
  Filled 2023-03-10 (×6): qty 2

## 2023-03-10 MED ORDER — ENSURE ENLIVE PO LIQD
237.0000 mL | Freq: Two times a day (BID) | ORAL | Status: DC
  Administered 2023-03-10 – 2023-03-16 (×11): 237 mL via ORAL
  Filled 2023-03-10: qty 237

## 2023-03-10 MED ORDER — MAGNESIUM OXIDE -MG SUPPLEMENT 400 (240 MG) MG PO TABS
400.0000 mg | ORAL_TABLET | Freq: Every day | ORAL | Status: DC
  Administered 2023-03-11 – 2023-03-16 (×6): 400 mg via ORAL
  Filled 2023-03-10 (×6): qty 1

## 2023-03-10 MED ORDER — CARVEDILOL 12.5 MG PO TABS
25.0000 mg | ORAL_TABLET | Freq: Two times a day (BID) | ORAL | Status: DC
  Administered 2023-03-10 – 2023-03-16 (×12): 25 mg via ORAL
  Filled 2023-03-10 (×12): qty 2

## 2023-03-10 MED ORDER — LEVETIRACETAM 500 MG PO TABS: 1500.0000 mg | ORAL_TABLET | Freq: Two times a day (BID) | ORAL | Status: DC

## 2023-03-10 NOTE — Progress Notes (Signed)
 Chaplains Antony Baumgartner and Ray visited patient and provided emotional and spiritual support. Patient did not have any current needs at this time.  Will follow as needed.  Alphonzo Ask, Chaplain Intern

## 2023-03-10 NOTE — Evaluation (Signed)
 Occupational Therapy Evaluation Patient Details Name: John Spears MRN: [id number] DOB: 11/24/1950 Today's Date: 03/10/2023   History of Present Illness Pt is 73 yo presenting to [facility] ED 03/09/23 due to R sided weakness, facial droop, aphasia and confusion. Imaging negative for CVA per MD and most likely seizure. PMH seizures on keppra , HTN, L MCA, heart failure, alcohol abuse   Clinical Impression   No family to determine baseline, pt reports walking with AD and completing basic ADLs independently. He relies on his sister for housekeeping and cooking. Pt presents with R UE weakness with significant incoordination and appears to have impaired sensation, R visual field deficit, decreased standing balance and impaired cognition. Pt ambulates with IV pole and hand held assist to bathroom. He needs min to moderate assistance for ADLs. Recommending HHOT if sister is capable of assisting pt at home, if not, patient will benefit from continued inpatient follow up therapy, <3 hours/day.       If plan is discharge home, recommend the following: A little help with walking and/or transfers;A lot of help with bathing/dressing/bathroom;Assistance with cooking/housework;Direct supervision/assist for medications management;Direct supervision/assist for financial management;Assist for transportation;Help with stairs or ramp for entrance;Supervision due to cognitive status    Functional Status Assessment  Patient has had a recent decline in their functional status and/or demonstrates limited ability to make significant improvements in function in a reasonable and predictable amount of time  Equipment Recommendations  None recommended by OT    Recommendations for Other Services       Precautions / Restrictions Precautions Precautions: Fall;Other (comment) Precaution Comments: seizure Restrictions Weight Bearing Restrictions Per Provider Order: No      Mobility Bed Mobility Overal bed mobility:  Needs Assistance Bed Mobility: Supine to Sit, Sit to Supine     Supine to sit: Contact guard, HOB elevated Sit to supine: Contact guard assist   General bed mobility comments: no physical assist from slightly elevated bed    Transfers Overall transfer level: Needs assistance Equipment used: 1 person hand held assist (IV pole) Transfers: Sit to/from Stand Sit to Stand: Min assist           General transfer comment: steadying assist with R hand held      Balance Overall balance assessment: Needs assistance   Sitting balance-Leahy Scale: Good Sitting balance - Comments: no LOB attempting to don socks   Standing balance support: Bilateral upper extremity supported Standing balance-Leahy Scale: Poor                             ADL either performed or assessed with clinical judgement   ADL Overall ADL's : Needs assistance/impaired Eating/Feeding: Minimal assistance;Sitting Eating/Feeding Details (indicate cue type and reason): requires use of non dominant L hand to bring cup to mouth Grooming: Wash/dry hands;Wash/dry face;Sitting;Minimal assistance Grooming Details (indicate cue type and reason): L hand lead Upper Body Bathing: Moderate assistance;Sitting   Lower Body Bathing: Moderate assistance;Sit to/from stand   Upper Body Dressing : Moderate assistance;Sitting   Lower Body Dressing: Moderate assistance;Sit to/from stand;Sitting/lateral leans Lower Body Dressing Details (indicate cue type and reason): bends forward initially, ultimately brought foot to EOB Toilet Transfer: Minimal assistance;Ambulation   Toileting- Clothing Manipulation and Hygiene: Minimal assistance (standing)       Functional mobility during ADLs: Minimal assistance (hand held and IV pole)       Vision Ability to See in Adequate Light: 1 Impaired Patient Visual Report: Peripheral  vision impairment Vision Assessment?: Vision impaired- to be further tested in functional  context;Yes Visual Fields: Right visual field deficit     Perception         Praxis         Pertinent Vitals/Pain Pain Assessment Pain Assessment: No/denies pain     Extremity/Trunk Assessment Upper Extremity Assessment Upper Extremity Assessment: Right hand dominant;RUE deficits/detail RUE Deficits / Details: ataxic quality to movement, generally 4/5 strength RUE Coordination: decreased fine motor;decreased gross motor   Lower Extremity Assessment Lower Extremity Assessment: Defer to PT evaluation RLE Deficits / Details: initially seemed weaker on the L which improved after standing and taking steps.   Cervical / Trunk Assessment Cervical / Trunk Assessment: Kyphotic   Communication Communication Communication: Difficulty communicating thoughts/reduced clarity of speech;Difficulty following commands/understanding   Cognition Arousal: Alert Behavior During Therapy: WFL for tasks assessed/performed Overall Cognitive Status: No family/caregiver present to determine baseline cognitive functioning Area of Impairment: Following commands, Safety/judgement, Problem solving, Memory, Attention, Orientation                 Orientation Level: Time, Situation Current Attention Level: Sustained Memory: Decreased short-term memory Following Commands: Follows one step commands with increased time Safety/Judgement: Decreased awareness of safety, Decreased awareness of deficits Awareness: Intellectual Problem Solving: Slow processing, Difficulty sequencing General Comments: pt unaware of current deficits, some perseveration noted     General Comments  No noted skin issues outside gown. VS on room air WNL.    Exercises     Shoulder Instructions      Home Living Family/patient expects to be discharged to:: Private residence Living Arrangements: Other relatives (sister) Available Help at Discharge: Family;Available 24 hours/day Type of Home: House Home Access: Stairs to  enter [employer] of Steps: 3 Entrance Stairs-Rails: Right Home Layout: One level     Bathroom Shower/Tub: Producer, television/film/video: Standard     Home Equipment: Shower seat;Grab bars - tub/shower;Rolling Environmental consultant (2 wheels)          Prior Functioning/Environment Prior Level of Function : Needs assist             Mobility Comments: pt reports he has a RW but mostly walks without it. ADLs Comments: sister assists with groceries, cleaning, laundry and meals        OT Problem List: Decreased strength;Decreased activity tolerance;Decreased range of motion;Impaired balance (sitting and/or standing);Decreased cognition;Decreased safety awareness;Impaired UE functional use;Impaired tone;Decreased coordination;Decreased knowledge of use of DME or AE      OT Treatment/Interventions: Self-care/ADL training;Therapeutic exercise;Neuromuscular education;DME and/or AE instruction;Therapeutic activities;Visual/perceptual remediation/compensation;Patient/family education;Balance training    OT Goals(Current goals can be found in the care plan section) Acute Rehab OT Goals OT Goal Formulation: With patient Time For Goal Achievement: 03/24/23 Potential to Achieve Goals: Fair ADL Goals Pt Will Perform Eating: with set-up;sitting Pt Will Perform Grooming: with supervision;standing Pt Will Perform Upper Body Dressing: with min assist;sitting Pt Will Perform Lower Body Dressing: with contact guard assist;sit to/from stand Pt Will Transfer to Toilet: with contact guard assist;ambulating Pt Will Perform Toileting - Clothing Manipulation and hygiene: with contact guard assist;sit to/from stand Additional ADL Goal #1: Pt will locate items on meal tray and sink in R hemispace with minimal verbal cues.  OT Frequency: Min 1X/week    Co-evaluation              AM-PAC OT "6 Clicks" Daily Activity     Outcome Measure Help from another person eating meals?: A Little  Help  from another person taking care of personal grooming?: A Little Help from another person toileting, which includes using toliet, bedpan, or urinal?: A Little Help from another person bathing (including washing, rinsing, drying)?: A Lot Help from another person to put on and taking off regular upper body clothing?: A Lot Help from another person to put on and taking off regular lower body clothing?: A Lot 6 Click Score: 15   End of Session Equipment Utilized During Treatment: Gait belt  Activity Tolerance: Patient tolerated treatment well Patient left: in bed;with call bell/phone within reach;with bed alarm set  OT Visit Diagnosis: Unsteadiness on feet (R26.81);Other abnormalities of gait and mobility (R26.89);Muscle weakness (generalized) (M62.81);Other symptoms and signs involving the nervous system (R29.898);Cognitive communication deficit (R41.841);Hemiplegia and hemiparesis                Time: 1450-1513 OT Time Calculation (min): 23 min Charges:  OT General Charges $OT Visit: 1 Visit OT Evaluation $OT Eval Moderate Complexity: 1 Mod OT Treatments $Self Care/Home Management : 8-22 mins Avanell Leigh, OTR/L Acute Rehabilitation Services Office: [phone]   Jonette Nestle 03/10/2023, 3:45 PM

## 2023-03-10 NOTE — ED Notes (Signed)
 Pt sister called and updated with permission from pt.

## 2023-03-10 NOTE — Progress Notes (Signed)
 Patient received from ED, alert and oriented X#3, vital signs stable, tele box connected, call bell within reach, bed alarm on, will continue to monitor.

## 2023-03-10 NOTE — Evaluation (Signed)
 Physical Therapy Evaluation Patient Details Name: John Spears MRN: [id number] DOB: 06/07/1950 Today's Date: 03/10/2023  History of Present Illness  Pt is 73 yo presenting to [facility] ED 03/09/23 due to R sided weakness, facial droop, aphasia and confusion. Imaging negative for CVA per MD and most likely seizure. PMH seizures on keppra , HTN, L MCA, heart failure, alcohol abuse  Clinical Impression  Pt is presenting slightly below baseline level of functioning. Pt reports that he has a walker but does not use it. Lives with sister who is able to assist per pt. Pt will need some supervision for safety on discharge from acute hospital setting in order to decrease risk for falls. Due to pt current functional status, home set up and available assistance at home recommending skilled physical therapy services 3x/week in order to address strength, balance and functional mobility to decrease risk for falls, injury and re-hospitalization.         If plan is discharge home, recommend the following: A little help with walking and/or transfers;Assist for transportation;Assistance with cooking/housework;Help with stairs or ramp for entrance;Supervision due to cognitive status     Equipment Recommendations None recommended by PT     Functional Status Assessment Patient has had a recent decline in their functional status and demonstrates the ability to make significant improvements in function in a reasonable and predictable amount of time.     Precautions / Restrictions Precautions Precautions: Fall Restrictions Weight Bearing Restrictions Per Provider Order: No      Mobility  Bed Mobility Overal bed mobility: Needs Assistance Bed Mobility: Supine to Sit, Sit to Supine     Supine to sit: Min assist Sit to supine: Min assist   General bed mobility comments: Min A for safety and progressing trunk to mid line. Min A for LE to supine due to height of  stretcher.    Transfers Overall transfer  level: Needs assistance Equipment used: 1 person hand held assist, None Transfers: Sit to/from Stand Sit to Stand: Contact guard assist, Min assist           General transfer comment: initially pt required MIn A with RLE presenting as weak. Pt then performed 2x from stretcher at [facility] for safety without UE support to assist in donning brief.    Ambulation/Gait Ambulation/Gait assistance: Contact guard assist, Min assist Gait Distance (Feet): 30 Feet Assistive device: None, 1 person hand held assist Gait Pattern/deviations: Step-through pattern, Step-to pattern, Shuffle, Knees buckling Gait velocity: decreased Gait velocity interpretation: <1.31 ft/sec, indicative of household ambulator   General Gait Details: initially R knee buckling, improved with gait, MIN A initially improved to CGA with 1 person HHA to no AD. Shuffling gait pattern with uneven step length with step to and very short step through gait     Balance Overall balance assessment: Needs assistance Sitting-balance support: Single extremity supported, Feet unsupported, Feet supported Sitting balance-Leahy Scale: Fair     Standing balance support: Single extremity supported, No upper extremity supported, During functional activity Standing balance-Leahy Scale: Fair Standing balance comment: no overt LOB, initially Pt RLE buckling pt able to improve with gait and still has mild balance deficits but no testing done.         Pertinent Vitals/Pain Pain Assessment Pain Assessment: No/denies pain    Home Living Family/patient expects to be discharged to:: Private residence Living Arrangements: Other relatives (lives with sister) Available Help at Discharge: Family;Available 24 hours/day Type of Home: House Home Access: Stairs to enter Entrance Stairs-Rails: Right  Entrance Stairs-Number of Steps: 3   Home Layout: One level Home Equipment: Shower seat;Grab bars - tub/shower;Rolling Walker (2 wheels)      Prior  Function Prior Level of Function : Independent/Modified Independent             Mobility Comments: pt reports he has a RW but mostly walks without it. ADLs Comments: sister assists with groceries, cleaning, laundry and meals.     Extremity/Trunk Assessment   Upper Extremity Assessment Upper Extremity Assessment: Generalized weakness    Lower Extremity Assessment Lower Extremity Assessment: Generalized weakness;RLE deficits/detail RLE Deficits / Details: initially seemed weaker on the L which improved after standing and taking steps.    Cervical / Trunk Assessment Cervical / Trunk Assessment: Kyphotic  Communication   Communication Communication: Difficulty following commands/understanding Following commands: Follows one step commands with increased time Cueing Techniques: Verbal cues  Cognition Arousal: Alert Behavior During Therapy: WFL for tasks assessed/performed Overall Cognitive Status: No family/caregiver present to determine baseline cognitive functioning     General Comments: pt states it is 2013, answered the rest of orientation questions  correctly including city, hospital, diagnosis        General Comments General comments (skin integrity, edema, etc.): No noted skin issues outside gown. VS on room air WNL.        Assessment/Plan    PT Assessment Patient needs continued PT services  PT Problem List Decreased strength;Decreased balance;Decreased mobility;Decreased safety awareness       PT Treatment Interventions DME instruction;Therapeutic activities;Gait training;Therapeutic exercise;Balance training;Functional mobility training;Stair training;Neuromuscular re-education;Patient/family education    PT Goals (Current goals can be found in the Care Plan section)  Acute Rehab PT Goals Patient Stated Goal: go back to house with sister. PT Goal Formulation: With patient Time For Goal Achievement: 03/24/23 Potential to Achieve Goals: Fair    Frequency  Min 1X/week        AM-PAC PT "6 Clicks" Mobility  Outcome Measure Help needed turning from your back to your side while in a flat bed without using bedrails?: A Little Help needed moving from lying on your back to sitting on the side of a flat bed without using bedrails?: A Little Help needed moving to and from a bed to a chair (including a wheelchair)?: A Little Help needed standing up from a chair using your arms (e.g., wheelchair or bedside chair)?: A Little Help needed to walk in hospital room?: A Little Help needed climbing 3-5 steps with a railing? : A Little 6 Click Score: 18    End of Session Equipment Utilized During Treatment: Gait belt Activity Tolerance: Patient tolerated treatment well Patient left: in bed;with call bell/phone within reach;with bed alarm set (chair alarm pad on stretcher) Nurse Communication: Mobility status PT Visit Diagnosis: Unsteadiness on feet (R26.81);Other abnormalities of gait and mobility (R26.89);Difficulty in walking, not elsewhere classified (R26.2)    Time: 1610-9604 PT Time Calculation (min) (ACUTE ONLY): 18 min   Charges:   PT Evaluation $PT Eval Low Complexity: 1 Low   PT General Charges $$ ACUTE PT VISIT: 1 Visit        Sloan Duncans, DPT, CLT  Acute Rehabilitation Services Office: [phone] (Secure chat preferred)   Jenice Mitts 03/10/2023, 12:48 PM

## 2023-03-10 NOTE — Progress Notes (Signed)
 PROGRESS NOTE    Kensen Tierrablanca  WUJ:[id number] DOB: 09/23/1950 DOA: 03/09/2023 PCP: Patient, No Pcp Per    Brief Narrative:  73 year old with history of alcohol abuse, ductile dysfunction, hypertension, seizure disorder, history of stroke PAF on Eliquis  recently admitted to the hospital with seizure and stroke, discharged to SNF went home and brought back to the emergency room with right-sided weakness, facial droop, aphasia and confusion.  Lives at home with sister.  Apparently currently on Keppra  1000 mg twice daily.  In the emergency room hemodynamically stable.  CT head, CT angiogram head and neck with no acute findings.  Stat EEG without active seizure.  Admitted with neurology consultation.  Subjective: Patient seen in the morning rounds.  Still emergency room.  No further episodes in the hospital.  Loaded with Keppra .  Apparently, he failed swallow evaluation at bedside so waiting for speech therapy evaluation. Assessment & Plan:   Breakthrough seizure in a patient with seizure disorder: Extensively investigated last month during ICU admissions.  Discharged on Keppra  1500 mg twice daily, currently taking 1000 mg twice daily at home. As per neurology recommendation, loaded with Keppra  2500 mg, will continue 1500 mg twice daily and continue as discharge dose. Fall precautions.  Delirium precautions.  Seizure precautions.  Work with PT OT.  May need rehab or placement.  Acute metabolic encephalopathy: Secondary to seizure.  No evidence of bacterial infection.  Improving.  Paroxysmal A-fib: Currently rate controlled in sinus rhythm.  On Coreg  and Eliquis .  Chronic diastolic heart failure: Euvolemic on exam.  CKD stage IIIa: Fairly stable.  Can transfer to MedSurg bed.  Need to work with speech, PT OT to determine next level of care.   DVT prophylaxis:  apixaban  (ELIQUIS ) tablet 5 mg   Code Status: Full code Family Communication: None at the bedside Disposition Plan: Status  is: Observation The patient remains OBS appropriate and will d/c before 2 midnights.     Consultants:  Neurology  Procedures:  EEG  Antimicrobials:  None     Objective: Vitals:   03/10/23 0730 03/10/23 0901 03/10/23 1100 03/10/23 1215  BP: (!) 164/70  (!) 160/83 (!) 155/90  Pulse: 62  76 68  Resp: 14  19 20   Temp:  99.2 F (37.3 C)    TempSrc:  Oral    SpO2: 100%  100% 100%  Weight:      Height:        Intake/Output Summary (Last 24 hours) at 03/10/2023 1243 Last data filed at 03/10/2023 0920 Gross per 24 hour  Intake 375.01 ml  Output 100 ml  Net 275.01 ml   Filed Weights   03/09/23 1400  Weight: 79.4 kg    Examination:  General exam: Appears calm and comfortable  Respiratory system: Clear to auscultation. Respiratory effort normal. Cardiovascular system: S1 & S2 heard, RRR. Gastrointestinal system: Soft and nontender.  Bowel sound present. Central nervous system: Alert and oriented.  No cranial nerve deficits. Right upper extremity with 4/5, weaker than left.  Equal strength in both lower extremities.    Data Reviewed: I have personally reviewed following labs and imaging studies  CBC: Recent Labs  Lab 03/09/23 1424 03/09/23 1426 03/10/23 0453  WBC 8.8  --  9.9  NEUTROABS 6.1  --   --   HGB 12.3* 13.6 11.2*  HCT 37.6* 40.0 33.9*  MCV 88.3  --  88.1  PLT 191  --  192   Basic Metabolic Panel: Recent Labs  Lab 03/09/23 1424 03/09/23  1426 03/09/23 1846 03/10/23 0500  NA 140 139  --  139  K 4.2 4.1  --  3.9  CL 110 112*  --  107  CO2 19*  --   --  20*  GLUCOSE 98 95  --  85  BUN 15 16  --  11  CREATININE 1.08 1.10  --  0.98  CALCIUM  9.4  --   --  9.4  MG  --   --  1.6*  --    GFR: Estimated Creatinine Clearance: 74.7 mL/min (by C-G formula based on SCr of 0.98 mg/dL). Liver Function Tests: Recent Labs  Lab 03/09/23 1424  AST 20  ALT 12  ALKPHOS 83  BILITOT 0.7  PROT 6.7  ALBUMIN 3.6   No results for input(s): "LIPASE",  "AMYLASE" in the last 168 hours. Recent Labs  Lab 03/09/23 1846  AMMONIA 23   Coagulation Profile: Recent Labs  Lab 03/09/23 1424  INR 1.1   Cardiac Enzymes: Recent Labs  Lab 03/09/23 1846  CKTOTAL 37*   BNP (last 3 results) No results for input(s): "PROBNP" in the last 8760 hours. HbA1C: No results for input(s): "HGBA1C" in the last 72 hours. CBG: Recent Labs  Lab 03/09/23 1423  GLUCAP 88   Lipid Profile: No results for input(s): "CHOL", "HDL", "LDLCALC", "TRIG", "CHOLHDL", "LDLDIRECT" in the last 72 hours. Thyroid Function Tests: Recent Labs    03/09/23 1846  TSH 1.305   Anemia Panel: Recent Labs    03/09/23 1846  VITAMINB12 597   Sepsis Labs: No results for input(s): "PROCALCITON", "LATICACIDVEN" in the last 168 hours.  No results found for this or any previous visit (from the past 240 hours).       Radiology Studies: DG CHEST PORT 1 VIEW Result Date: 03/09/2023 CLINICAL DATA:  Seizure. EXAM: PORTABLE CHEST 1 VIEW COMPARISON:  01/31/2023 FINDINGS: Normal heart size for technique. No focal airspace disease, pleural effusion or pneumothorax. Normal pulmonary vasculature. No acute osseous findings. IMPRESSION: No acute findings. Electronically Signed   By: Chadwick Colonel M.D.   On: 03/09/2023 17:55   EEG adult Result Date: 03/09/2023 Arleene Lack, MD     03/09/2023  5:03 PM Patient Name: Kemet Bidlack MRN: [id number] Epilepsy Attending: Arleene Lack Referring Physician/Provider: Audrene Lease, NP Date: 03/09/2023 Duration: 26.11 mins Patient history: 73yo F presented with right sided weakness, facial droop, aphasia and confusion. EEG to evaluate for seizure Level of alertness: Awake AEDs during EEG study: Ativan  Technical aspects: This EEG study was done with scalp electrodes positioned according to the 10-20 International system of electrode placement. Electrical activity was reviewed with band pass filter of 1-70Hz , sensitivity of 7 uV/mm,  display speed of 7mm/sec with a 60Hz  notched filter applied as appropriate. EEG data were recorded continuously and digitally stored.  Video monitoring was available and reviewed as appropriate. Description: The posterior dominant rhythm consists of 8-9 Hz activity of moderate voltage (25-35 uV) seen predominantly in posterior head regions, symmetric and reactive to eye opening and eye closing. EEG showed continuous 2-3Hz  delta slowing in left hemisphere. Hyperventilation and photic stimulation were not performed.   ABNORMALITY - Continuous slow, left hemisphere IMPRESSION: This study is suggestive of cortical dysfunction arising from left hemisphere likely secondary to underlying structural abnormality, post-ictal state. No seizures or epileptiform discharges were seen throughout the recording. Priyanka O Yadav   CT ANGIO HEAD NECK W WO CM W PERF (CODE STROKE) Result Date: 03/09/2023 CLINICAL DATA:  Neuro deficit,  acute, stroke suspected. EXAM: CT ANGIOGRAPHY HEAD AND NECK CT PERFUSION BRAIN TECHNIQUE: Multidetector CT imaging of the head and neck was performed using the standard protocol during bolus administration of intravenous contrast. Multiplanar CT image reconstructions and MIPs were obtained to evaluate the vascular anatomy. Carotid stenosis measurements (when applicable) are obtained utilizing NASCET criteria, using the distal internal carotid diameter as the denominator. Multiphase CT imaging of the brain was performed following IV bolus contrast injection. Subsequent parametric perfusion maps were calculated using RAPID software. RADIATION DOSE REDUCTION: This exam was performed according to the departmental dose-optimization program which includes automated exposure control, adjustment of the mA and/or kV according to patient size and/or use of iterative reconstruction technique. CONTRAST:  1mL OMNIPAQUE  IOHEXOL  350 MG/ML SOLN COMPARISON:  Head CT same day FINDINGS: CTA NECK FINDINGS Aortic arch:  Aortic atherosclerosis. Branching pattern is normal without origin stenosis. Right carotid system: Common carotid artery shows scattered plaque but is widely patent to the bifurcation. No bifurcation or ICA bulb plaque. Cervical ICA widely patent. Left carotid system: Common carotid artery widely patent to the bifurcation. Carotid bifurcation is normal. Cervical ICA is normal. Vertebral arteries: Both vertebral artery origins are widely patent. Both vertebral arteries appear normal through the cervical region to the foramen magnum. Skeleton: Mild cervical spondylosis. Other neck: No mass or lymphadenopathy. Upper chest: Lung apices are clear. Review of the MIP images confirms the above findings CTA HEAD FINDINGS Anterior circulation: Both internal carotid arteries are widely patent through the skull base and siphon regions. Ordinary siphon atherosclerotic calcification but no stenosis greater than 30%. The anterior and middle cerebral vessels are patent. No large vessel occlusion or proximal flow limiting stenosis. No aneurysm or vascular malformation. Posterior circulation: Both vertebral arteries patent through the foramen magnum to the basilar artery. No basilar stenosis. Posterior circulation branch vessels are patent. There is stenotic disease in both posterior cerebral arteries. Venous sinuses: Patent and normal. Anatomic variants: None significant. Review of the MIP images confirms the above findings CT Brain Perfusion Findings: ASPECTS: 10, allowing for the old left parietal infarction. CBF (<30%) Volume: 0mL Perfusion (Tmax>6.0s) volume: 0mL Mismatch Volume: 0mL Infarction Location:None IMPRESSION: 1. No large vessel occlusion or proximal flow limiting stenosis. 2. Aortic atherosclerosis. 3. No carotid bifurcation disease. 4. Posterior cerebral artery stenotic disease bilaterally. 5. Negative perfusion study. Aortic Atherosclerosis (ICD10-I70.0). Electronically Signed   By: Bettylou Brunner M.D.   On:  03/09/2023 14:50   CT HEAD CODE STROKE WO CONTRAST Result Date: 03/09/2023 CLINICAL DATA:  Code stroke. Neuro deficit, acute, stroke suspected. EXAM: CT HEAD WITHOUT CONTRAST TECHNIQUE: Contiguous axial images were obtained from the base of the skull through the vertex without intravenous contrast. RADIATION DOSE REDUCTION: This exam was performed according to the departmental dose-optimization program which includes automated exposure control, adjustment of the mA and/or kV according to patient size and/or use of iterative reconstruction technique. COMPARISON:  01/26/2023 FINDINGS: Brain: No focal abnormality seen affecting the brainstem or cerebellum. Old small vessel infarctions affect the thalami and cerebral hemispheric white matter. Old left parietal cortical and subcortical infarction. No evidence of acute infarction, mass lesion, hemorrhage, hydrocephalus or extra-axial collection. Vascular: There is atherosclerotic calcification of the major vessels at the base of the brain. Skull: Negative Sinuses/Orbits: Clear sinuses. Old medial orbital blowout fracture on the right. Other: None ASPECTS (Alberta Stroke Program Early CT Score) - Ganglionic level infarction (caudate, lentiform nuclei, internal capsule, insula, M1-M3 cortex): 7, allowing for the old infarction. - Supraganglionic infarction (M4-M6  cortex): 3, allowing the old infarction. Total score (0-10 with 10 being normal): 10, allowing for the old infarction. IMPRESSION: 1. No acute CT finding. Old small vessel infarctions of the thalami and cerebral hemispheric white matter. Old left parietal cortical and subcortical infarction. 2. Aspects is 10, allowing for the old infarction. These results were communicated to Dr. Arora at 2:39 pm on 03/09/2023 by text page via the [facility] messaging system. Electronically Signed   By: Bettylou Brunner M.D.   On: 03/09/2023 14:39        Scheduled Meds:  amLODipine   10 mg Oral QHS   apixaban   5 mg Oral BID    aspirin  EC  81 mg Oral QPM   atorvastatin   40 mg Oral QHS   carvedilol   25 mg Oral BID WC   [START ON 03/11/2023] cyanocobalamin   1,000 mcg Oral q AM   feeding supplement  237 mL Oral BID BM   [START ON 03/11/2023] folic acid   1 mg Oral q AM   furosemide   20 mg Oral Daily   magnesium  oxide  400 mg Oral Daily   multivitamin with minerals  1 tablet Oral QHS   mouth rinse  15 mL Mouth Rinse Q2H   sodium chloride  flush  3 mL Intravenous Once   tamsulosin   0.4 mg Oral QHS   [START ON 03/11/2023] thiamine   100 mg Oral q AM   Continuous Infusions:  levETIRAcetam        LOS: 0 days    Time spent: 40 minutes    Vada Garibaldi, MD [facility]

## 2023-03-10 NOTE — Progress Notes (Signed)
 NEUROLOGY CONSULT FOLLOW UP NOTE   Date of service: March 10, 2023 Patient Name: John Spears MRN:  [id number] DOB:  09-May-1950  Brief HPI  John Spears is a 73 y.o. male with a history of previous stroke with right arm weakness previous seizure disorder as well as alcohol abuse who presented on 03/09/2023 with breakthrough seizure.  I had seen him previously in December and at the time had recommended going to 1500 twice daily on his Keppra , but it appears he has been taking 1 g twice daily.   Interval Hx/subjective   No further seizures, much improved Vitals   Vitals:   03/10/23 0700 03/10/23 0730 03/10/23 0901 03/10/23 1100  BP: (!) 165/68 (!) 164/70  (!) 160/83  Pulse: 67 62  76  Resp: 17 14  19   Temp:   99.2 F (37.3 C)   TempSrc:   Oral   SpO2: 100% 100%  100%  Weight:      Height:         Body mass index is 24.41 kg/m.  Physical Exam   Constitutional: Appears well-developed and well-nourished.   Neurologic Examination   Gen: in bed, NAD  Neuro: MS: Awake, alert, he does have hesitancy of speech but is able to answer questions.  He tells me that he is in the hospital, and knows the month is January, but gives the year is 2017 CN: EOMI Motor: He has a mild right hemiparesis, holds his right hand clenched, good strength of the left Sensory: Intact to light touch  Labs and Diagnostic Imaging   CBC:  Recent Labs  Lab 03/09/23 1424 03/09/23 1426 03/10/23 0453  WBC 8.8  --  9.9  NEUTROABS 6.1  --   --   HGB 12.3* 13.6 11.2*  HCT 37.6* 40.0 33.9*  MCV 88.3  --  88.1  PLT 191  --  192    Basic Metabolic Panel:  Lab Results  Component Value Date   NA 139 03/10/2023   K 3.9 03/10/2023   CO2 20 (L) 03/10/2023   GLUCOSE 85 03/10/2023   BUN 11 03/10/2023   CREATININE 0.98 03/10/2023   CALCIUM  9.4 03/10/2023   GFRNONAA >60 03/10/2023   GFRAA >60 04/04/2019   Lipid Panel:  Lab Results  Component Value Date   LDLCALC 133 (H) 03/08/2019    HgbA1c:  Lab Results  Component Value Date   HGBA1C 5.3 01/26/2023   Urine Drug Screen:     Component Value Date/Time   LABOPIA NONE DETECTED 01/27/2023 1800   COCAINSCRNUR NONE DETECTED 01/27/2023 1800   COCAINSCRNUR NONE DETECTED 02/20/2022 1652   LABBENZ POSITIVE (A) 01/27/2023 1800   AMPHETMU NONE DETECTED 01/27/2023 1800   THCU NONE DETECTED 01/27/2023 1800   LABBARB POSITIVE (A) 01/27/2023 1800     Imaging(Personally reviewed):  CT/CTA-negative  Impression   John Spears is a 73 y.o. male with a history of seizures with breakthrough seizure.  He apparently was only taking 1 g twice daily of Keppra , and I would favor increasing this to 1.5 g twice daily.  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  Recommendations  Keppra  1.5 g twice daily Neurology will be available as needed. ______________________________________________________________________   Thank you for the opportunity to take part in the care of this patient. If you have any further questions, please contact the neurology consultation team on call.  Updated oncall schedule is listed on AMION.  Signed,  Ann Keto Neurohospitalist

## 2023-03-10 NOTE — Evaluation (Signed)
 Clinical/Bedside Swallow Evaluation Patient Details  Name: John Spears MRN: [id number] Date of Birth: Oct 04, 1950  Today's Date: 03/10/2023 Time: SLP Start Time (ACUTE ONLY): 1513 SLP Stop Time (ACUTE ONLY): 1527 SLP Time Calculation (min) (ACUTE ONLY): 14 min  Past Medical History:  Past Medical History:  Diagnosis Date   Alcohol abuse    Heart failure with improved ejection fraction (HFimpEF) (HCC) 02/2022   a. 02/2019 Echo: EF 60-65%, GrI DD; b. 01/2022 Echo: EF 30-35%; c. 06/2022 Echo: EF 60-65%, mild LVH, RI DD, nl RV fxn, triv MR.   Hypertension    Seizure (HCC)    Seizure disorder [facility])    Past Surgical History:  Past Surgical History:  Procedure Laterality Date   JOINT REPLACEMENT     HPI:  John Spears is a 73 yo male presenting to ED 1/14 with R sided weakness, facial droop, and aphasia. Had a breakthrough seizure in the ED. Previously seen by SLP November 2024 for management of oral dysphagia secondary to edentulism and altered mentation. CTH negative for acute intracranial abnormality. PMH includes ETOH abuse, diastolic CHF, HTN, seizure disorder, prior CVA, PAF on Eliquis , CKD stage 3    Assessment / Plan / Recommendation  Clinical Impression  Pt is known to this SLP from previous admission, during which it was recommended he consume a mechanical soft diet due to missing dentition. Pt reports he wears a partial plate but his sister must have taken it home with her. Pt is confused, adamantly stating he is 73 yo but with Min cueing is able to participate and follow commands to complete an oral motor exam. Observed pt with trials of thin liqudis, purees, and regular solids without signs clinically concerning for dysphagia or aspiration. Recommend initiating a diet of Dys 3 solids with thin liquids given missing dentition, which is suspected to be close to baseline. If pt's partial is able to be located, feel he could upgrade to regular solids but no further SLP f/u is  needed acutely. Will s/o. SLP Visit Diagnosis: Dysphagia, unspecified (R13.10)    Aspiration Risk  Mild aspiration risk    Diet Recommendation Dysphagia 3 (Mech soft);Thin liquid    Liquid Administration via: Cup;Straw Medication Administration: Whole meds with liquid Supervision: Staff to assist with self feeding Compensations: Slow rate;Small sips/bites;Minimize environmental distractions Postural Changes: Seated upright at 90 degrees    Other  Recommendations Oral Care Recommendations: Oral care BID    Recommendations for follow up therapy are one component of a multi-disciplinary discharge planning process, led by the attending physician.  Recommendations may be updated based on patient status, additional functional criteria and insurance authorization.  Follow up Recommendations No SLP follow up      Assistance Recommended at Discharge    Functional Status Assessment Patient has not had a recent decline in their functional status  Frequency and Duration            Prognosis Prognosis for improved oropharyngeal function: Good      Swallow Study   General HPI: John Spears is a 73 yo male presenting to ED 1/14 with R sided weakness, facial droop, and aphasia. Had a breakthrough seizure in the ED. Previously seen by SLP November 2024 for management of oral dysphagia secondary to edentulism and altered mentation. CTH negative for acute intracranial abnormality. PMH includes ETOH abuse, diastolic CHF, HTN, seizure disorder, prior CVA, PAF on Eliquis , CKD stage 3 Type of Study: Bedside Swallow Evaluation Previous Swallow Assessment: see HPI Diet  Prior to this Study: NPO Temperature Spikes Noted: No Respiratory Status: Room air History of Recent Intubation: No Behavior/Cognition: Alert;Cooperative Oral Cavity Assessment: Within Functional Limits Oral Care Completed by SLP: No Oral Cavity - Dentition: Missing dentition;Other (Comment) (wears partial but it is  unavailable) Vision: Functional for self-feeding Self-Feeding Abilities: Needs assist Patient Positioning: Upright in bed Baseline Vocal Quality: Normal Volitional Cough: Strong Volitional Swallow: Able to elicit    Oral/Motor/Sensory Function Overall Oral Motor/Sensory Function: Within functional limits   Ice Chips Ice chips: Not tested   Thin Liquid Thin Liquid: Within functional limits Presentation: Straw    Nectar Thick Nectar Thick Liquid: Not tested   Honey Thick Honey Thick Liquid: Not tested   Puree Puree: Within functional limits Presentation: Spoon   Solid     Solid: Within functional limits Presentation: Self Fed      Amil Kale, M.A., CF-SLP Speech Language Pathology, Acute Rehabilitation Services  Secure Chat preferred [phone]  03/10/2023,3:47 PM

## 2023-03-11 DIAGNOSIS — R4701 Aphasia: Secondary | ICD-10-CM | POA: Diagnosis present

## 2023-03-11 DIAGNOSIS — G9341 Metabolic encephalopathy: Secondary | ICD-10-CM | POA: Diagnosis present

## 2023-03-11 DIAGNOSIS — G40909 Epilepsy, unspecified, not intractable, without status epilepticus: Secondary | ICD-10-CM | POA: Diagnosis present

## 2023-03-11 DIAGNOSIS — I5032 Chronic diastolic (congestive) heart failure: Secondary | ICD-10-CM | POA: Diagnosis present

## 2023-03-11 DIAGNOSIS — I4510 Unspecified right bundle-branch block: Secondary | ICD-10-CM | POA: Diagnosis present

## 2023-03-11 DIAGNOSIS — G8384 Todd's paralysis (postepileptic): Secondary | ICD-10-CM | POA: Diagnosis present

## 2023-03-11 DIAGNOSIS — R471 Dysarthria and anarthria: Secondary | ICD-10-CM | POA: Diagnosis present

## 2023-03-11 DIAGNOSIS — Z79899 Other long term (current) drug therapy: Secondary | ICD-10-CM | POA: Diagnosis not present

## 2023-03-11 DIAGNOSIS — Z87891 Personal history of nicotine dependence: Secondary | ICD-10-CM | POA: Diagnosis not present

## 2023-03-11 DIAGNOSIS — B9789 Other viral agents as the cause of diseases classified elsewhere: Secondary | ICD-10-CM | POA: Diagnosis present

## 2023-03-11 DIAGNOSIS — Z8249 Family history of ischemic heart disease and other diseases of the circulatory system: Secondary | ICD-10-CM | POA: Diagnosis not present

## 2023-03-11 DIAGNOSIS — Z7982 Long term (current) use of aspirin: Secondary | ICD-10-CM | POA: Diagnosis not present

## 2023-03-11 DIAGNOSIS — I480 Paroxysmal atrial fibrillation: Secondary | ICD-10-CM | POA: Diagnosis present

## 2023-03-11 DIAGNOSIS — G8191 Hemiplegia, unspecified affecting right dominant side: Secondary | ICD-10-CM | POA: Diagnosis present

## 2023-03-11 DIAGNOSIS — R414 Neurologic neglect syndrome: Secondary | ICD-10-CM | POA: Diagnosis present

## 2023-03-11 DIAGNOSIS — I130 Hypertensive heart and chronic kidney disease with heart failure and stage 1 through stage 4 chronic kidney disease, or unspecified chronic kidney disease: Secondary | ICD-10-CM | POA: Diagnosis present

## 2023-03-11 DIAGNOSIS — Z7901 Long term (current) use of anticoagulants: Secondary | ICD-10-CM | POA: Diagnosis not present

## 2023-03-11 DIAGNOSIS — R569 Unspecified convulsions: Secondary | ICD-10-CM | POA: Diagnosis present

## 2023-03-11 DIAGNOSIS — N1831 Chronic kidney disease, stage 3a: Secondary | ICD-10-CM | POA: Diagnosis present

## 2023-03-11 DIAGNOSIS — F1010 Alcohol abuse, uncomplicated: Secondary | ICD-10-CM | POA: Diagnosis present

## 2023-03-11 DIAGNOSIS — R29810 Facial weakness: Secondary | ICD-10-CM | POA: Diagnosis present

## 2023-03-11 MED ORDER — MELATONIN 5 MG PO TABS
5.0000 mg | ORAL_TABLET | Freq: Every evening | ORAL | Status: DC | PRN
  Administered 2023-03-14 – 2023-03-15 (×2): 5 mg via ORAL
  Filled 2023-03-11 (×2): qty 1

## 2023-03-11 MED ORDER — LEVETIRACETAM 750 MG PO TABS: 1500.0000 mg | ORAL_TABLET | Freq: Two times a day (BID) | ORAL | 0 refills | Status: AC

## 2023-03-11 NOTE — Progress Notes (Signed)
PROGRESS NOTE    John Spears  VZD:[id number] DOB: [birth year] DOA: 03/09/2023 PCP: Patient, No Pcp Per    Brief Narrative:  73 year old with history of alcohol abuse, diastolic dysfunction, hypertension, seizure disorder, history of stroke PAF on Eliquis recently admitted to the hospital with seizure and stroke, discharged to SNF went home and brought back to the emergency room with right-sided weakness, facial droop, aphasia and confusion.  Lives at home with sister.  Apparently currently on Keppra 1000 mg twice daily.  In the emergency room hemodynamically stable.  CT head, CT angiogram head and neck with no acute findings.  Stat EEG without active seizure.  Admitted with neurology consultation.  Subjective:  Patient seen and examined.  No overnight events.  He is not confident whether he can go home.  Worked with physical therapy and needed a lot of support to walk.  Will refer to SNF. Assessment & Plan:   Breakthrough seizure in a patient with seizure disorder: Extensively investigated last month during ICU admissions.  Discharged on Keppra 1500 mg twice daily, currently taking 1000 mg twice daily at home. As per neurology recommendation, loaded with Keppra 2500 mg, will continue 1500 mg twice daily and continue as discharge dose. Fall precautions.  Delirium precautions.  Seizure precautions.  Work with PT OT.  May need rehab or placement.  Acute metabolic encephalopathy: Secondary to seizure.  No evidence of bacterial infection.  Improving.  Paroxysmal A-fib: Currently rate controlled in sinus rhythm.  On Coreg and Eliquis.  Chronic diastolic heart failure: Euvolemic on exam.  CKD stage IIIa: Fairly stable.    DVT prophylaxis:  apixaban (ELIQUIS) tablet 5 mg   Code Status: Full code Family Communication: Sister on the phone. Disposition Plan: Status is: Inpatient.  Remains inpatient.  Needs skilled nursing facility.  Stable for discharge.     Consultants:   Neurology  Procedures:  EEG  Antimicrobials:  None     Objective: Vitals:   03/10/23 2318 03/11/23 0400 03/11/23 0722 03/11/23 1105  BP: (!) 167/76 (!) 166/86 (!) 172/74 137/70  Pulse: 82 72 69 67  Resp: 18 18    Temp: 98 F (36.7 C) 99.9 F (37.7 C) 99.3 F (37.4 C) (!) 100.7 F (38.2 C)  TempSrc: Oral Oral Oral Axillary  SpO2: 100% 100% 100% 97%  Weight:      Height:        Intake/Output Summary (Last 24 hours) at 03/11/2023 1341 Last data filed at 03/11/2023 0300 Gross per 24 hour  Intake --  Output 150 ml  Net -150 ml   Filed Weights   03/09/23 1400  Weight: 79.4 kg    Examination:  General exam: Appears calm and comfortable.  Not in any distress.  Chronically sick looking. Respiratory system: Clear to auscultation. Respiratory effort normal. Cardiovascular system: S1 & S2 heard, RRR. Gastrointestinal system: Soft and nontender.  Bowel sound present. Central nervous system: Alert and oriented.  No cranial nerve deficits. Right upper extremity with 4/5, weaker than left.  Equal strength in both lower extremities.    Data Reviewed: I have personally reviewed following labs and imaging studies  CBC: Recent Labs  Lab 03/09/23 1424 03/09/23 1426 03/10/23 0453  WBC 8.8  --  9.9  NEUTROABS 6.1  --   --   HGB 12.3* 13.6 11.2*  HCT 37.6* 40.0 33.9*  MCV 88.3  --  88.1  PLT 191  --  192   Basic Metabolic Panel: Recent Labs  Lab 03/09/23 1424  03/09/23 1426 03/09/23 1846 03/10/23 0500  NA 140 139  --  139  K 4.2 4.1  --  3.9  CL 110 112*  --  107  CO2 19*  --   --  20*  GLUCOSE 98 95  --  85  BUN 15 16  --  11  CREATININE 1.08 1.10  --  0.98  CALCIUM 9.4  --   --  9.4  MG  --   --  1.6*  --    GFR: Estimated Creatinine Clearance: 74.7 mL/min (by C-G formula based on SCr of 0.98 mg/dL). Liver Function Tests: Recent Labs  Lab 03/09/23 1424  AST 20  ALT 12  ALKPHOS 83  BILITOT 0.7  PROT 6.7  ALBUMIN 3.6   No results for input(s):  "LIPASE", "AMYLASE" in the last 168 hours. Recent Labs  Lab 03/09/23 1846  AMMONIA 23   Coagulation Profile: Recent Labs  Lab 03/09/23 1424  INR 1.1   Cardiac Enzymes: Recent Labs  Lab 03/09/23 1846  CKTOTAL 37*   BNP (last 3 results) No results for input(s): "PROBNP" in the last 8760 hours. HbA1C: No results for input(s): "HGBA1C" in the last 72 hours. CBG: Recent Labs  Lab 03/09/23 1423  GLUCAP 88   Lipid Profile: No results for input(s): "CHOL", "HDL", "LDLCALC", "TRIG", "CHOLHDL", "LDLDIRECT" in the last 72 hours. Thyroid Function Tests: Recent Labs    03/09/23 1846  TSH 1.305   Anemia Panel: Recent Labs    03/09/23 1846  VITAMINB12 597   Sepsis Labs: No results for input(s): "PROCALCITON", "LATICACIDVEN" in the last 168 hours.  No results found for this or any previous visit (from the past 240 hours).       Radiology Studies: DG CHEST PORT 1 VIEW Result Date: 03/09/2023 CLINICAL DATA:  Seizure. EXAM: PORTABLE CHEST 1 VIEW COMPARISON:  01/31/2023 FINDINGS: Normal heart size for technique. No focal airspace disease, pleural effusion or pneumothorax. Normal pulmonary vasculature. No acute osseous findings. IMPRESSION: No acute findings. Electronically Signed   By: Narda Rutherford M.D.   On: 03/09/2023 17:55   EEG adult Result Date: 03/09/2023 Charlsie Quest, MD     03/09/2023  5:03 PM Patient Name: John Spears MRN: [id number] Epilepsy Attending: Charlsie Quest Referring Physician/Provider: Lynnae January, NP Date: 03/09/2023 Duration: 26.11 mins Patient history: 73yo F presented with right sided weakness, facial droop, aphasia and confusion. EEG to evaluate for seizure Level of alertness: Awake AEDs during EEG study: Ativan Technical aspects: This EEG study was done with scalp electrodes positioned according to the 10-20 International system of electrode placement. Electrical activity was reviewed with band pass filter of 1-70Hz , sensitivity of 7  uV/mm, display speed of 23mm/sec with a 60Hz  notched filter applied as appropriate. EEG data were recorded continuously and digitally stored.  Video monitoring was available and reviewed as appropriate. Description: The posterior dominant rhythm consists of 8-9 Hz activity of moderate voltage (25-35 uV) seen predominantly in posterior head regions, symmetric and reactive to eye opening and eye closing. EEG showed continuous 2-3Hz  delta slowing in left hemisphere. Hyperventilation and photic stimulation were not performed.   ABNORMALITY - Continuous slow, left hemisphere IMPRESSION: This study is suggestive of cortical dysfunction arising from left hemisphere likely secondary to underlying structural abnormality, post-ictal state. No seizures or epileptiform discharges were seen throughout the recording. Priyanka Annabelle Harman   CT ANGIO HEAD NECK W WO CM W PERF (CODE STROKE) Result Date: 03/09/2023 CLINICAL DATA:  Neuro  deficit, acute, stroke suspected. EXAM: CT ANGIOGRAPHY HEAD AND NECK CT PERFUSION BRAIN TECHNIQUE: Multidetector CT imaging of the head and neck was performed using the standard protocol during bolus administration of intravenous contrast. Multiplanar CT image reconstructions and MIPs were obtained to evaluate the vascular anatomy. Carotid stenosis measurements (when applicable) are obtained utilizing NASCET criteria, using the distal internal carotid diameter as the denominator. Multiphase CT imaging of the brain was performed following IV bolus contrast injection. Subsequent parametric perfusion maps were calculated using RAPID software. RADIATION DOSE REDUCTION: This exam was performed according to the departmental dose-optimization program which includes automated exposure control, adjustment of the mA and/or kV according to patient size and/or use of iterative reconstruction technique. CONTRAST:  1mL OMNIPAQUE IOHEXOL 350 MG/ML SOLN COMPARISON:  Head CT same day FINDINGS: CTA NECK FINDINGS Aortic  arch: Aortic atherosclerosis. Branching pattern is normal without origin stenosis. Right carotid system: Common carotid artery shows scattered plaque but is widely patent to the bifurcation. No bifurcation or ICA bulb plaque. Cervical ICA widely patent. Left carotid system: Common carotid artery widely patent to the bifurcation. Carotid bifurcation is normal. Cervical ICA is normal. Vertebral arteries: Both vertebral artery origins are widely patent. Both vertebral arteries appear normal through the cervical region to the foramen magnum. Skeleton: Mild cervical spondylosis. Other neck: No mass or lymphadenopathy. Upper chest: Lung apices are clear. Review of the MIP images confirms the above findings CTA HEAD FINDINGS Anterior circulation: Both internal carotid arteries are widely patent through the skull base and siphon regions. Ordinary siphon atherosclerotic calcification but no stenosis greater than 30%. The anterior and middle cerebral vessels are patent. No large vessel occlusion or proximal flow limiting stenosis. No aneurysm or vascular malformation. Posterior circulation: Both vertebral arteries patent through the foramen magnum to the basilar artery. No basilar stenosis. Posterior circulation branch vessels are patent. There is stenotic disease in both posterior cerebral arteries. Venous sinuses: Patent and normal. Anatomic variants: None significant. Review of the MIP images confirms the above findings CT Brain Perfusion Findings: ASPECTS: 10, allowing for the old left parietal infarction. CBF (<30%) Volume: 0mL Perfusion (Tmax>6.0s) volume: 0mL Mismatch Volume: 0mL Infarction Location:None IMPRESSION: 1. No large vessel occlusion or proximal flow limiting stenosis. 2. Aortic atherosclerosis. 3. No carotid bifurcation disease. 4. Posterior cerebral artery stenotic disease bilaterally. 5. Negative perfusion study. Aortic Atherosclerosis (ICD10-I70.0). Electronically Signed   By: Paulina Fusi M.D.   On:  03/09/2023 14:50   CT HEAD CODE STROKE WO CONTRAST Result Date: 03/09/2023 CLINICAL DATA:  Code stroke. Neuro deficit, acute, stroke suspected. EXAM: CT HEAD WITHOUT CONTRAST TECHNIQUE: Contiguous axial images were obtained from the base of the skull through the vertex without intravenous contrast. RADIATION DOSE REDUCTION: This exam was performed according to the departmental dose-optimization program which includes automated exposure control, adjustment of the mA and/or kV according to patient size and/or use of iterative reconstruction technique. COMPARISON:  01/26/2023 FINDINGS: Brain: No focal abnormality seen affecting the brainstem or cerebellum. Old small vessel infarctions affect the thalami and cerebral hemispheric white matter. Old left parietal cortical and subcortical infarction. No evidence of acute infarction, mass lesion, hemorrhage, hydrocephalus or extra-axial collection. Vascular: There is atherosclerotic calcification of the major vessels at the base of the brain. Skull: Negative Sinuses/Orbits: Clear sinuses. Old medial orbital blowout fracture on the right. Other: None ASPECTS (Alberta Stroke Program Early CT Score) - Ganglionic level infarction (caudate, lentiform nuclei, internal capsule, insula, M1-M3 cortex): 7, allowing for the old infarction. - Supraganglionic infarction (  M4-M6 cortex): 3, allowing the old infarction. Total score (0-10 with 10 being normal): 10, allowing for the old infarction. IMPRESSION: 1. No acute CT finding. Old small vessel infarctions of the thalami and cerebral hemispheric white matter. Old left parietal cortical and subcortical infarction. 2. Aspects is 10, allowing for the old infarction. These results were communicated to Dr. Wilford Corner at 2:39 pm on 03/09/2023 by text page via the [facility] messaging system. Electronically Signed   By: Paulina Fusi M.D.   On: 03/09/2023 14:39        Scheduled Meds:  amLODipine  10 mg Oral QHS   apixaban  5 mg Oral BID    aspirin EC  81 mg Oral QPM   atorvastatin  40 mg Oral QHS   carvedilol  25 mg Oral BID WC   cyanocobalamin  1,000 mcg Oral q AM   feeding supplement  237 mL Oral BID BM   folic acid  1 mg Oral q AM   furosemide  20 mg Oral Daily   magnesium oxide  400 mg Oral Daily   multivitamin with minerals  1 tablet Oral QHS   mouth rinse  15 mL Mouth Rinse Q2H   sodium chloride flush  3 mL Intravenous Once   tamsulosin  0.4 mg Oral QHS   thiamine  100 mg Oral q AM   Continuous Infusions:  levETIRAcetam 1,500 mg (03/11/23 0910)     LOS: 0 days    Time spent: 40 minutes    Dorcas Carrow, MD [facility]

## 2023-03-11 NOTE — Progress Notes (Signed)
Occupational Therapy Treatment Patient Details Name: John Spears MRN: [id number] DOB: 11-Aug-1950 Today's Date: 03/11/2023   History of present illness Pt is 73 yo presenting to [facility] ED 03/09/23 due to R sided weakness, facial droop, aphasia and confusion.  CT head negative for CVA per MD and most likely seizure. PMH seizures on keppra, HTN, L MCA, heart failure, alcohol abuse   OT comments  Patient received in supine and agreeable to OT session. Patient able to get to EOB without assistance and stood with therapist on left and patient having difficulty navigating around items on right and therapist stood on right side and provided cued to ambulate to bathroom. Patient requiring assistance to lower and rise from toilet. Patient stating pain at RUE and performed AAROM and AROM to address functional use. Patient will benefit from continued inpatient follow up therapy, <3 hours/day to increase independence and safety with bathing, dressing, grooming, and increasing functional use of RUE. Acute OT to continue to follow.       If plan is discharge home, recommend the following:  A little help with walking and/or transfers;A lot of help with bathing/dressing/bathroom;Assistance with cooking/housework;Direct supervision/assist for medications management;Direct supervision/assist for financial management;Assist for transportation;Help with stairs or ramp for entrance;Supervision due to cognitive status   Equipment Recommendations  None recommended by OT    Recommendations for Other Services      Precautions / Restrictions Precautions Precautions: Fall;Other (comment) Precaution Comments: seizure Restrictions Weight Bearing Restrictions Per Provider Order: No       Mobility Bed Mobility Overal bed mobility: Needs Assistance Bed Mobility: Supine to Sit     Supine to sit: Contact guard, HOB elevated     General bed mobility comments: cues for safety    Transfers Overall transfer  level: Needs assistance Equipment used: 1 person hand held assist Transfers: Sit to/from Stand Sit to Stand: Min assist           General transfer comment: stood from EOB and toilet with min assist to power up, min assist for balance     Balance Overall balance assessment: Needs assistance Sitting-balance support: Single extremity supported, Feet unsupported, Feet supported Sitting balance-Leahy Scale: Good Sitting balance - Comments: EOB   Standing balance support: Single extremity supported, During functional activity Standing balance-Leahy Scale: Poor                             ADL either performed or assessed with clinical judgement   ADL Overall ADL's : Needs assistance/impaired     Grooming: Wash/dry hands;Wash/dry face;Sitting;Minimal assistance Grooming Details (indicate cue type and reason): cues to use left hand                 Toilet Transfer: Minimal assistance;Ambulation Toilet Transfer Details (indicate cue type and reason): ambulated to bathroom with cues to avoid door on right and assistance to lower and rise from toilet Toileting- Clothing Manipulation and Hygiene: Minimal assistance         General ADL Comments: inattention to right with difficulty using RUE to assist with self care    Extremity/Trunk Assessment              Vision       Perception     Praxis      Cognition Arousal: Alert Behavior During Therapy: [facility] for tasks assessed/performed Overall Cognitive Status: Impaired/Different from baseline Area of Impairment: Following commands, Safety/judgement, Problem solving, Memory, Attention, Orientation  Orientation Level: Time, Situation Current Attention Level: Sustained Memory: Decreased short-term memory Following Commands: Follows one step commands with increased time, Follows one step commands inconsistently Safety/Judgement: Decreased awareness of safety, Decreased awareness of  deficits Awareness: Intellectual Problem Solving: Slow processing, Difficulty sequencing, Decreased initiation, Requires verbal cues, Requires tactile cues General Comments: stating, "I think I had a stroke" right inattention with patient running into objects and difficulty locating items to the right        Exercises Exercises: General Upper Extremity General Exercises - Upper Extremity Shoulder Flexion: AAROM, Right, 10 reps Shoulder Extension: AAROM, Right, 10 reps Elbow Flexion: AROM, Right, 10 reps Elbow Extension: AROM, Right, 10 reps Digit Composite Flexion: AROM, Right, 10 reps Composite Extension: AROM, Right, 10 reps    Shoulder Instructions       General Comments Spoke with RN, MD, and Case Manager re: ?ability to go home with sister. Called sister, John Spears, and she reported cannot provide this level of physical assist.    Pertinent Vitals/ Pain       Pain Assessment Pain Assessment: Faces Faces Pain Scale: Hurts little more Pain Location: RUE Pain Descriptors / Indicators: Aching, Sore, Grimacing Pain Intervention(s): Limited activity within patient's tolerance, Monitored during session  Home Living                                          Prior Functioning/Environment              Frequency  Min 1X/week        Progress Toward Goals  OT Goals(current goals can now be found in the care plan section)  Progress towards OT goals: Progressing toward goals  Acute Rehab OT Goals Patient Stated Goal: none stated OT Goal Formulation: With patient Time For Goal Achievement: 03/24/23 Potential to Achieve Goals: Fair ADL Goals Pt Will Perform Eating: with set-up;sitting Pt Will Perform Grooming: with supervision;standing Pt Will Perform Upper Body Dressing: with min assist;sitting Pt Will Perform Lower Body Dressing: with contact guard assist;sit to/from stand Pt Will Transfer to Toilet: with contact guard assist;ambulating Pt Will  Perform Toileting - Clothing Manipulation and hygiene: with contact guard assist;sit to/from stand Additional ADL Goal #1: Pt will locate items on meal tray and sink in R hemispace with minimal verbal cues.  Plan      Co-evaluation                 AM-PAC OT "6 Clicks" Daily Activity     Outcome Measure   Help from another person eating meals?: A Little Help from another person taking care of personal grooming?: A Little Help from another person toileting, which includes using toliet, bedpan, or urinal?: A Little Help from another person bathing (including washing, rinsing, drying)?: A Lot Help from another person to put on and taking off regular upper body clothing?: A Lot Help from another person to put on and taking off regular lower body clothing?: A Lot 6 Click Score: 15    End of Session Equipment Utilized During Treatment: Gait belt  OT Visit Diagnosis: Unsteadiness on feet (R26.81);Other abnormalities of gait and mobility (R26.89);Muscle weakness (generalized) (M62.81);Other symptoms and signs involving the nervous system (R29.898);Cognitive communication deficit (R41.841);Hemiplegia and hemiparesis   Activity Tolerance Patient tolerated treatment well   Patient Left in chair;with call bell/phone within reach;with chair alarm set   Nurse Communication Mobility status  Time: 1206-1230 OT Time Calculation (min): 24 min  Charges: OT General Charges $OT Visit: 1 Visit OT Treatments $Self Care/Home Management : 8-22 mins $Therapeutic Exercise: 8-22 mins  Alfonse Flavors, OTA Acute Rehabilitation Services  Office [phone]   Dewain Penning 03/11/2023, 12:44 PM

## 2023-03-11 NOTE — [place] FL2 (Addendum)
[place] MEDICAID FL2 LEVEL OF CARE FORM     IDENTIFICATION  Patient Name: John Spears Birthdate: [birth year] Sex: male Admission Date (Current Location): 03/09/2023  [facility] and [place] Number:  Chiropodist and Address:  [facility] [place], [address] [place], [place] [address]      Provider Number: [id number]  Attending Physician Name and Address:  Dorcas Carrow, MD  Relative Name and Phone Number:       Current Level of Care: Hospital Recommended Level of Care: Skilled Nursing Facility Prior Approval Number:    Date Approved/Denied:   PASRR Number: [id number]  Discharge Plan: SNF    Current Diagnoses: Patient Active Problem List   Diagnosis Date Noted   Chronic diastolic CHF (congestive heart failure) (HCC) 03/09/2023   Paroxysmal atrial fibrillation (HCC) 03/09/2023   Seizure (HCC) 03/09/2023   Acute encephalopathy 03/09/2023   Acute metabolic encephalopathy 01/26/2023   Malnutrition of moderate degree 12/24/2022   ETOH abuse 12/22/2022   Thrombocytopenia (HCC) 12/22/2022   Breakthrough seizure (HCC) 12/22/2022   CKD (chronic kidney disease), stage III (HCC) 09/25/2022   Chest congestion 04/04/2022   Chronic cough 04/04/2022   Dilated cardiomyopathy (HCC) 02/26/2022   Alcohol abuse 02/26/2022   Seizures (HCC) 02/20/2022   Transaminitis    Traumatic rhabdomyolysis (HCC)    Thrombocytopenia (HCC)    Cerebrovascular accident (CVA) (HCC) 04/05/2016    Orientation RESPIRATION BLADDER Height & Weight     Self, Place  Normal Incontinent Weight: 175 lb 0.7 oz (79.4 kg) Height:  5\' 11"  (180.3 cm)  BEHAVIORAL SYMPTOMS/MOOD NEUROLOGICAL BOWEL NUTRITION STATUS      Incontinent Diet (DYS 3, Thin fluid)  AMBULATORY STATUS COMMUNICATION OF NEEDS Skin   Extensive Assist Verbally Skin abrasions (Skin tear, right thigh, Foam Dressing)                       Personal Care Assistance Level of Assistance  Bathing, Feeding,  Dressing Bathing Assistance: Limited assistance Feeding assistance: Limited assistance Dressing Assistance: Limited assistance     Functional Limitations Info  Speech     Speech Info: Impaired    SPECIAL CARE FACTORS FREQUENCY  PT (By licensed PT), OT (By licensed OT)     PT Frequency: 5x/week OT Frequency: 5x/week            Contractures Contractures Info: Not present    Additional Factors Info  Code Status, Allergies Code Status Info: Full Allergies Info: NKA           Current Medications (03/11/2023):  This is the current hospital active medication list Current Facility-Administered Medications  Medication Dose Route Frequency Provider Last Rate Last Admin   acetaminophen (TYLENOL) tablet 650 mg  650 mg Oral Q6H PRN Orland Mustard, MD       Or   acetaminophen (TYLENOL) suppository 650 mg  650 mg Rectal Q6H PRN Orland Mustard, MD       amLODipine (NORVASC) tablet 10 mg  10 mg Oral QHS Dorcas Carrow, MD   10 mg at 03/10/23 2127   apixaban (ELIQUIS) tablet 5 mg  5 mg Oral BID Orland Mustard, MD   5 mg at 03/11/23 6644   aspirin EC tablet 81 mg  81 mg Oral QPM Dorcas Carrow, MD   81 mg at 03/10/23 1709   atorvastatin (LIPITOR) tablet 40 mg  40 mg Oral QHS Orland Mustard, MD   40 mg at 03/10/23 2126   carvedilol (COREG)  tablet 25 mg  25 mg Oral BID WC Orland Mustard, MD   25 mg at 03/11/23 5284   cyanocobalamin (VITAMIN B12) tablet 1,000 mcg  1,000 mcg Oral q AM Orland Mustard, MD   1,000 mcg at 03/11/23 1324   feeding supplement (ENSURE ENLIVE / ENSURE PLUS) liquid 237 mL  237 mL Oral BID BM Dorcas Carrow, MD   237 mL at 03/11/23 0911   folic acid (FOLVITE) tablet 1 mg  1 mg Oral q AM Orland Mustard, MD   1 mg at 03/11/23 4010   furosemide (LASIX) tablet 20 mg  20 mg Oral Daily Dorcas Carrow, MD   20 mg at 03/11/23 0911   levETIRAcetam (KEPPRA) IVPB 1500 mg/ 100 mL premix  1,500 mg Intravenous Q12H Dorcas Carrow, MD 400 mL/hr at 03/11/23 0910 1,500 mg at 03/11/23  0910   LORazepam (ATIVAN) injection 1 mg  1 mg Intravenous Once PRN Orland Mustard, MD       magnesium oxide (MAG-OX) tablet 400 mg  400 mg Oral Daily Dorcas Carrow, MD   400 mg at 03/11/23 0912   melatonin tablet 5 mg  5 mg Oral QHS PRN John Giovanni, MD       metoprolol tartrate (LOPRESSOR) injection 5 mg  5 mg Intravenous Q6H PRN Orland Mustard, MD   5 mg at 03/10/23 1706   multivitamin with minerals tablet 1 tablet  1 tablet Oral QHS Orland Mustard, MD   1 tablet at 03/10/23 2126   ondansetron (ZOFRAN) tablet 4 mg  4 mg Oral Q6H PRN Orland Mustard, MD       Or   ondansetron [facility]) injection 4 mg  4 mg Intravenous Q6H PRN Orland Mustard, MD       Oral care mouth rinse  15 mL Mouth Rinse Lindalou Hose, MD   15 mL at 03/11/23 0911   Oral care mouth rinse  15 mL Mouth Rinse PRN Orland Mustard, MD       sodium chloride flush (NS) 0.9 % injection 3 mL  3 mL Intravenous Once Gloris Manchester, MD       tamsulosin [facility]) capsule 0.4 mg  0.4 mg Oral QHS Dorcas Carrow, MD   0.4 mg at 03/10/23 2127   thiamine (VITAMIN B1) tablet 100 mg  100 mg Oral q AM Orland Mustard, MD   100 mg at 03/11/23 2725     Discharge Medications: Please see discharge summary for a list of discharge medications.  Relevant Imaging Results:  Relevant Lab Results:   Additional Information SS#: [id number]  Antion Felipa Emory, Student-Social Work

## 2023-03-11 NOTE — Progress Notes (Signed)
Physical Therapy Treatment Patient Details Name: John Spears MRN: [id number] DOB: 1951-01-16 Today's Date: 03/11/2023   History of Present Illness Pt is 73 yo presenting to [facility] ED 03/09/23 due to R sided weakness, facial droop, aphasia and confusion.  CT head negative for CVA per MD and most likely seizure. PMH seizures on keppra, HTN, L MCA, heart failure, alcohol abuse    PT Comments  Patient with significant deficits and running into objects on his right with no awareness as to why he was "stuck" in a location and unable to correct. Required min assist with ambulation and unable to grasp/hold RW with RUE (see below for details). On return to room, he could not problem-solve how to "turn and sit down on the bed." He repeatedly bent forward, placing rt hand on mattress and eventually required mod assist to stand-pivot 180 degrees to back up to bed and sit down. Called and spoke with pt's sister, Kathie Rhodes, re: level of assist pt is requiring and she reports she is unable to physically assist pt due to health issues of her own (reports she is 73 yo). Notified RN, MD and Case Manager and updated discharge recommendations to inpatient rehab with <3 hours therapy per day.     If plan is discharge home, recommend the following: A little help with walking and/or transfers;Assist for transportation;Assistance with cooking/housework;Help with stairs or ramp for entrance;Supervision due to cognitive status;A lot of help with bathing/dressing/bathroom;Assistance with feeding;Direct supervision/assist for medications management;Direct supervision/assist for financial management   Can travel by private vehicle        Equipment Recommendations  None recommended by PT    Recommendations for Other Services       Precautions / Restrictions Precautions Precautions: Fall;Other (comment) Precaution Comments: seizure Restrictions Weight Bearing Restrictions Per Provider Order: No     Mobility  Bed  Mobility Overal bed mobility: Needs Assistance Bed Mobility: Supine to Sit, Sit to Supine     Supine to sit: Contact guard, HOB elevated Sit to supine: Contact guard assist   General bed mobility comments: no physical assist from slightly elevated bed    Transfers Overall transfer level: Needs assistance Equipment used: 1 person hand held assist, Rolling walker (2 wheels) Transfers: Sit to/from Stand Sit to Stand: Min assist           General transfer comment: pt easily grasped RW with LUE, no awareness that his RUE was not on RW and when cued to do so, he kept missing the handle; on return to room, pt standing facing the bed and could not understand or figure out how to turn around and sit on the bed, ultimately mod assist to pivot 180 degrees and sit    Ambulation/Gait Ambulation/Gait assistance: Min assist Gait Distance (Feet): 50 Feet (w/ RW; 50 no device, seated rest, 100 no device) Assistive device: None, Rolling walker (2 wheels) Gait Pattern/deviations: Step-through pattern, Step-to pattern, Shuffle, Decreased stride length Gait velocity: decreased     General Gait Details: pt with difficulty maneuvering RW with decr use of RUE and ?rt visual field deficit with running into chair and rt doorframe   Stairs Stairs: Yes Stairs assistance: Min assist Stair Management: One rail Left, Step to pattern, Forwards, Sideways Number of Stairs: 2 General stair comments: pt unaware of railing on his rt; after ascending with left rail, pt turned to face rail and side-stepped down using LUE on rail and RUE out in space (unable to find/grasp rail)   Wheelchair Mobility  Tilt Bed    Modified Rankin (Stroke Patients Only) Modified Rankin (Stroke Patients Only) Pre-Morbid Rankin Score: No symptoms Modified Rankin: Moderately severe disability     Balance Overall balance assessment: Needs assistance Sitting-balance support: Single extremity supported, Feet unsupported,  Feet supported Sitting balance-Leahy Scale: Good     Standing balance support: No upper extremity supported Standing balance-Leahy Scale: Poor                              Cognition Arousal: Alert Behavior During Therapy: WFL for tasks assessed/performed Overall Cognitive Status: Impaired/Different from baseline Area of Impairment: Following commands, Safety/judgement, Problem solving, Memory, Attention, Orientation                 Orientation Level: Time, Situation Current Attention Level: Sustained Memory: Decreased short-term memory Following Commands: Follows one step commands with increased time, Follows one step commands inconsistently Safety/Judgement: Decreased awareness of safety, Decreased awareness of deficits Awareness: Intellectual Problem Solving: Slow processing, Difficulty sequencing, Decreased initiation, Requires verbal cues, Requires tactile cues General Comments: rt inattention with running into objects on his rt and unable to problem-solve how to correct and get around them; could not understand how to turn around and sit on the bed (tried for >1 minute and ultimately needed mod assist to pivot and then sit)        Exercises      General Comments General comments (skin integrity, edema, etc.): Spoke with RN, MD, and Case Manager re: ?ability to go home with sister. Called sister, Kathie Rhodes, and she reported cannot provide this level of physical assist.      Pertinent Vitals/Pain Pain Assessment Pain Assessment: No/denies pain    Home Living                          Prior Function            PT Goals (current goals can now be found in the care plan section) Acute Rehab PT Goals Patient Stated Goal: go back to house with sister. Time For Goal Achievement: 03/24/23 Potential to Achieve Goals: Fair Progress towards PT goals: Progressing toward goals    Frequency    Min 1X/week      PT Plan      Co-evaluation               AM-PAC PT "6 Clicks" Mobility   Outcome Measure  Help needed turning from your back to your side while in a flat bed without using bedrails?: A Little Help needed moving from lying on your back to sitting on the side of a flat bed without using bedrails?: A Little Help needed moving to and from a bed to a chair (including a wheelchair)?: A Little Help needed standing up from a chair using your arms (e.g., wheelchair or bedside chair)?: A Little Help needed to walk in hospital room?: A Lot Help needed climbing 3-5 steps with a railing? : A Lot 6 Click Score: 16    End of Session Equipment Utilized During Treatment: Gait belt Activity Tolerance: Patient limited by fatigue Patient left: in bed;with call bell/phone within reach;with bed alarm set Nurse Communication: Mobility status;Other (comment) (concern re: dc home with sister) PT Visit Diagnosis: Unsteadiness on feet (R26.81);Other abnormalities of gait and mobility (R26.89);Difficulty in walking, not elsewhere classified (R26.2)     Time: 6295-2841 PT Time Calculation (min) (ACUTE ONLY): 24 min  Charges:    $Gait Training: 23-37 mins PT General Charges $$ ACUTE PT VISIT: 1 Visit                      Jerolyn Center, PT Acute Rehabilitation Services  Office [phone]    Zena Amos 03/11/2023, 12:56 PM

## 2023-03-11 NOTE — TOC Initial Note (Signed)
Transition of Care [facility]) - Initial/Assessment Note    Patient Details  Name: John Spears MRN: [id number] Date of Birth: 07-08-1950  Transition of Care [facility]) CM/SW Contact:    Baldemar Lenis, LCSW Phone Number: 03/11/2023, 4:24 PM  Clinical Narrative:      CSW updated by PT of change of recommendation to SNF, sister unable to provide support to patient and agreeable to SNF. Patient with recent stay at [facility], can return with new insurance authorization. CSW asked CMA to initiate auth request, awaiting approval.             Expected Discharge Plan: Skilled Nursing Facility Barriers to Discharge: Continued Medical Work up, English as a second language teacher   Patient Goals and CMS Choice Patient states their goals for this hospitalization and ongoing recovery are:: patient unable to participate in goal setting, not fully oriented CMS Medicare.gov Compare Post Acute Care list provided to:: Patient Represenative (must comment) Choice offered to / list presented to : Sibling [place] ownership interest in [facility].provided to:: Sibling    Expected Discharge Plan and Services     Post Acute Care Choice: Skilled Nursing Facility Living arrangements for the past 2 months: Single Family Home Expected Discharge Date: 03/11/23                                    Prior Living Arrangements/Services Living arrangements for the past 2 months: Single Family Home Lives with:: Siblings Patient language and need for interpreter reviewed:: No Do you feel safe going back to the place where you live?: Yes      Need for Family Participation in Patient Care: Yes (Comment) Care giver support system in place?: No (comment)   Criminal Activity/Legal Involvement Pertinent to Current Situation/Hospitalization: No - Comment as needed  Activities of Daily Living      Permission Sought/Granted Permission sought to share information with : Facility Social worker, Family Supports Permission granted to share information with : Yes, Verbal Permission Granted  Share Information with NAME: Kathie Rhodes  Permission granted to share info w AGENCY: SNF  Permission granted to share info w Relationship: Sister     Emotional Assessment   Attitude/Demeanor/Rapport: Unable to Assess Affect (typically observed): Unable to Assess Orientation: : Oriented to Self, Oriented to Place Alcohol / Substance Use: Not Applicable Psych Involvement: No (comment)  Admission diagnosis:  Seizure (HCC) [R56.9] Stroke-like symptoms [R29.90] Patient Active Problem List   Diagnosis Date Noted   Chronic diastolic CHF (congestive heart failure) (HCC) 03/09/2023   Paroxysmal atrial fibrillation (HCC) 03/09/2023   Seizure (HCC) 03/09/2023   Acute encephalopathy 03/09/2023   Acute metabolic encephalopathy 01/26/2023   Malnutrition of moderate degree 12/24/2022   ETOH abuse 12/22/2022   Thrombocytopenia (HCC) 12/22/2022   Breakthrough seizure (HCC) 12/22/2022   CKD (chronic kidney disease), stage III (HCC) 09/25/2022   Chest congestion 04/04/2022   Chronic cough 04/04/2022   Dilated cardiomyopathy (HCC) 02/26/2022   Alcohol abuse 02/26/2022   Seizures (HCC) 02/20/2022   Transaminitis    Traumatic rhabdomyolysis (HCC)    Thrombocytopenia (HCC)    Cerebrovascular accident (CVA) (HCC) 04/05/2016   PCP:  Patient, No Pcp Per Pharmacy:   [facility] [address] ([address]), [place] - [address]) [place] [address] Phone: [phone] Fax: [phone]  [id number] - [place], [place] - [address]  [place] [address] Phone: [phone] Fax: [phone]  [facility] [address] [place], [place] - [address] [place] [address] Phone: [phone] Fax: [phone]  [facility] [address] [place]  [address] Phone: [phone] Fax: [phone]     Social Drivers of Health (SDOH) Social History: SDOH Screenings   Food Insecurity: Patient Unable To Answer (01/27/2023)  Housing: Patient Unable To Answer (01/27/2023)  Transportation Needs: Patient Unable To Answer (01/27/2023)  Utilities: Patient Unable To Answer (01/27/2023)  Tobacco Use: Medium Risk (03/10/2023)   SDOH Interventions:     Readmission Risk Interventions     No data to display

## 2023-03-12 DIAGNOSIS — R569 Unspecified convulsions: Secondary | ICD-10-CM | POA: Diagnosis not present

## 2023-03-12 LAB — RESPIRATORY PANEL BY PCR

## 2023-03-12 LAB — SARS CORONAVIRUS 2 BY RT PCR: SARS Coronavirus 2 by RT PCR: NEGATIVE

## 2023-03-12 LAB — GLUCOSE, CAPILLARY: Glucose-Capillary: 116 mg/dL — ABNORMAL HIGH (ref 70–99)

## 2023-03-12 MED ORDER — SODIUM CHLORIDE 0.9 % IV SOLN
100.0000 mg | INTRAVENOUS | Status: AC
  Administered 2023-03-12: 100 mg via INTRAVENOUS
  Filled 2023-03-12: qty 10

## 2023-03-12 MED ORDER — SODIUM CHLORIDE 0.9 % IV SOLN
100.0000 mg | Freq: Two times a day (BID) | INTRAVENOUS | Status: DC
  Administered 2023-03-12 – 2023-03-13 (×2): 100 mg via INTRAVENOUS
  Filled 2023-03-12 (×3): qty 10

## 2023-03-12 NOTE — Progress Notes (Signed)
1610 patient had a low grade fever of 100.7. Monica Becton MD paged and tylenol was given for fever. Patient fever rechecked and was 98.5 after tylenol dose.

## 2023-03-12 NOTE — Plan of Care (Signed)
  Problem: Clinical Measurements: Goal: Cardiovascular complication will be avoided Outcome: Progressing   Problem: Nutrition: Goal: Adequate nutrition will be maintained Outcome: Progressing   

## 2023-03-12 NOTE — Progress Notes (Signed)
PROGRESS NOTE    John Spears  MVH:[id number] DOB: [birth year] DOA: 03/09/2023 PCP: Patient, No Pcp Per    Brief Narrative:  73 year old with history of alcohol abuse, diastolic dysfunction, hypertension, seizure disorder, history of stroke, PAF on Eliquis recently admitted to the hospital with seizure and stroke, discharged to SNF went home and brought back to the emergency room with right-sided weakness, facial droop, aphasia and confusion.  Lives at home with sister.  Apparently currently on Keppra 1000 mg twice daily.  In the emergency room hemodynamically stable.  CT head, CT angiogram head and neck with no acute findings.  Stat EEG without active seizure.  Admitted with neurology consultation.  Patient was preparing to be discharged to a SNF, however he had 3 short lasting generalized tonic-clonic seizure witnessed in the hospital.  Also low-grade fever and found to have rhinovirus infection.  Subjective:  Patient seen in the morning rounds multiple times.  Patient had total 3 episodes of generalized seizure, tonic-clonic that will last about 40 to 45 seconds and avoid on itself.  Patient will be lethargic but able to respond after the seizure.  Did not have any injuries. Patient also having low-grade fever, a virus panel was done that showed rhinovirus infection. Case discussed with neurology, he was loaded with Vimpat.  Given Ativan.  Assessment & Plan:   Breakthrough seizure in a patient with seizure disorder: Extensively investigated last month during ICU admissions.  Discharged on Keppra 1500 mg twice daily, currently taking 1000 mg twice daily at home. As per neurology recommendation, loaded with Keppra 2500 mg, will continue 1500 mg twice daily . Patient was witnessed to have 3 consecutive seizures while inpatient despite being on Keppra.  Loaded with Vimpat.  Neurology reconsulted. Continue seizure precautions. SNF once stabilized.   Acute metabolic encephalopathy:  Secondary to seizure.  No evidence of bacterial infection.  Intermittent confusion and sleepiness due to seizure.   Paroxysmal A-fib: Currently rate controlled in sinus rhythm.  On Coreg and Eliquis.   Chronic diastolic heart failure: Euvolemic on exam.   CKD stage IIIa: Fairly stable.  Recheck labs tomorrow.   DVT prophylaxis:  apixaban (ELIQUIS) tablet 5 mg   Code Status: Full code Family Communication: None today Disposition Plan: Status is: Inpatient Remains inpatient appropriate because: Active seizure treatment     Consultants:  Neurology  Procedures:  EEG  Antimicrobials:  None     Objective: Vitals:   03/12/23 0041 03/12/23 0428 03/12/23 0600 03/12/23 0737  BP: 136/70 (!) 150/75  (!) 146/63  Pulse: 68 70  75  Resp:  18    Temp: 100 F (37.8 C) (!) 100.7 F (38.2 C) 98.5 F (36.9 C) 99.4 F (37.4 C)  TempSrc: Axillary Axillary Oral Oral  SpO2: 100% 100%  100%  Weight:      Height:        Intake/Output Summary (Last 24 hours) at 03/12/2023 0857 Last data filed at 03/11/2023 2201 Gross per 24 hour  Intake 540 ml  Output --  Net 540 ml   Filed Weights   03/09/23 1400  Weight: 79.4 kg    Examination:  General exam: Appears sleepy but able to respond. Respiratory system: Clear to auscultation. Respiratory effort normal.  No added sounds. Cardiovascular system: S1 & S2 heard, RRR.  Gastrointestinal system: Soft.  Nontender.  Bowel sound present. Central nervous system: Alert and awake while not having seizure.  Sleepy immediately after seizure but able to respond to simple questions. Extremities: Right  upper and lower extremity weaker than left.  More pronounced today.    Data Reviewed: I have personally reviewed following labs and imaging studies  CBC: Recent Labs  Lab 03/09/23 1424 03/09/23 1426 03/10/23 0453  WBC 8.8  --  9.9  NEUTROABS 6.1  --   --   HGB 12.3* 13.6 11.2*  HCT 37.6* 40.0 33.9*  MCV 88.3  --  88.1  PLT 191  --  192    Basic Metabolic Panel: Recent Labs  Lab 03/09/23 1424 03/09/23 1426 03/09/23 1846 03/10/23 0500  NA 140 139  --  139  K 4.2 4.1  --  3.9  CL 110 112*  --  107  CO2 19*  --   --  20*  GLUCOSE 98 95  --  85  BUN 15 16  --  11  CREATININE 1.08 1.10  --  0.98  CALCIUM 9.4  --   --  9.4  MG  --   --  1.6*  --    GFR: Estimated Creatinine Clearance: 74.7 mL/min (by C-G formula based on SCr of 0.98 mg/dL). Liver Function Tests: Recent Labs  Lab 03/09/23 1424  AST 20  ALT 12  ALKPHOS 83  BILITOT 0.7  PROT 6.7  ALBUMIN 3.6   No results for input(s): "LIPASE", "AMYLASE" in the last 168 hours. Recent Labs  Lab 03/09/23 1846  AMMONIA 23   Coagulation Profile: Recent Labs  Lab 03/09/23 1424  INR 1.1   Cardiac Enzymes: Recent Labs  Lab 03/09/23 1846  CKTOTAL 37*   BNP (last 3 results) No results for input(s): "PROBNP" in the last 8760 hours. HbA1C: No results for input(s): "HGBA1C" in the last 72 hours. CBG: Recent Labs  Lab 03/09/23 1423 03/12/23 0838  GLUCAP 88 116*   Lipid Profile: No results for input(s): "CHOL", "HDL", "LDLCALC", "TRIG", "CHOLHDL", "LDLDIRECT" in the last 72 hours. Thyroid Function Tests: Recent Labs    03/09/23 1846  TSH 1.305   Anemia Panel: Recent Labs    03/09/23 1846  VITAMINB12 597   Sepsis Labs: No results for input(s): "PROCALCITON", "LATICACIDVEN" in the last 168 hours.  No results found for this or any previous visit (from the past 240 hours).       Radiology Studies: No results found.      Scheduled Meds:  amLODipine  10 mg Oral QHS   apixaban  5 mg Oral BID   aspirin EC  81 mg Oral QPM   atorvastatin  40 mg Oral QHS   carvedilol  25 mg Oral BID WC   cyanocobalamin  1,000 mcg Oral q AM   feeding supplement  237 mL Oral BID BM   folic acid  1 mg Oral q AM   furosemide  20 mg Oral Daily   magnesium oxide  400 mg Oral Daily   multivitamin with minerals  1 tablet Oral QHS   sodium chloride flush  3  mL Intravenous Once   tamsulosin  0.4 mg Oral QHS   thiamine  100 mg Oral q AM   Continuous Infusions:  lacosamide (VIMPAT) IV     levETIRAcetam 1,500 mg (03/12/23 0839)     LOS: 1 day    Time spent: 50 minutes    Dorcas Carrow, MD [facility]

## 2023-03-12 NOTE — TOC Progression Note (Signed)
Transition of Care [facility]) - Progression Note    Patient Details  Name: John Spears MRN: [id number] Date of Birth: Oct 05, 1950  Transition of Care [facility]) CM/SW Contact  Baldemar [place], [place] Phone Number: 03/12/2023, 9:43 AM  Clinical Narrative:   CSW received insurance approval for patient to admit to SNF, but updated by medical team that patient has had three seizures this morning; will hold for the weekend to monitor, pending neuro. CSW updated [facility]. CSW to follow.    Expected Discharge Plan: Skilled Nursing Facility Barriers to Discharge: Continued Medical Work up  Expected Discharge Plan and Services     Post Acute Care Choice: Skilled Nursing Facility Living arrangements for the past 2 months: Single Family Home Expected Discharge Date: 03/11/23                                     Social Determinants of Health (SDOH) Interventions SDOH Screenings   Food Insecurity: Patient Unable To Answer (01/27/2023)  Housing: Patient Unable To Answer (01/27/2023)  Transportation Needs: Patient Unable To Answer (01/27/2023)  Utilities: Patient Unable To Answer (01/27/2023)  Tobacco Use: Medium Risk (03/10/2023)    Readmission Risk Interventions     No data to display

## 2023-03-12 NOTE — Plan of Care (Signed)
  Problem: Clinical Measurements: Goal: Cardiovascular complication will be avoided Outcome: Progressing   Problem: Nutrition: Goal: Adequate nutrition will be maintained Outcome: Progressing   Problem: Elimination: Goal: Will not experience complications related to urinary retention Outcome: Progressing   Problem: Pain Managment: Goal: General experience of comfort will improve and/or be controlled Outcome: Progressing

## 2023-03-12 NOTE — Progress Notes (Signed)
NEUROLOGY CONSULT FOLLOW UP NOTE   Date of service: March 12, 2023 Patient Name: John Spears MRN:  [id number] DOB:  Nov 03, 1950  Brief HPI  John Spears is a 73 y.o. male who presented with breakthrough seizures in the setting of known seizure disorder.  Levetiracetam was increased from 1000 twice daily to 1500 twice daily   Interval Hx/subjective   Unfortunately he began having seizures this morning, he has had three this morning.  He has a right hemiparesis, but this is typical after seizures. Vitals   Vitals:   03/12/23 0041 03/12/23 0428 03/12/23 0600 03/12/23 0737  BP: 136/70 (!) 150/75  (!) 146/63  Pulse: 68 70  75  Resp:  18    Temp: 100 F (37.8 C) (!) 100.7 F (38.2 C) 98.5 F (36.9 C) 99.4 F (37.4 C)  TempSrc: Axillary Axillary Oral Oral  SpO2: 100% 100%  100%  Weight:      Height:         Body mass index is 24.41 kg/m.  Physical Exam   General: In bed, NAD  Neurologic Examination   MS: awake, alert, able to answer questions CN: Right hemianopia, able to cross to the right, but mild left preference  Motor: 2/5 in the right arm and leg.  Sensory: endorses symmetric sensation  Labs and Diagnostic Imaging   CBC:  Recent Labs  Lab 03/09/23 1424 03/09/23 1426 03/10/23 0453  WBC 8.8  --  9.9  NEUTROABS 6.1  --   --   HGB 12.3* 13.6 11.2*  HCT 37.6* 40.0 33.9*  MCV 88.3  --  88.1  PLT 191  --  192    Basic Metabolic Panel:  Lab Results  Component Value Date   NA 139 03/10/2023   K 3.9 03/10/2023   CO2 20 (L) 03/10/2023   GLUCOSE 85 03/10/2023   BUN 11 03/10/2023   CREATININE 0.98 03/10/2023   CALCIUM 9.4 03/10/2023   GFRNONAA >60 03/10/2023   GFRAA >60 04/04/2019   Lipid Panel:  Lab Results  Component Value Date   LDLCALC 133 (H) 03/08/2019   HgbA1c:  Lab Results  Component Value Date   HGBA1C 5.3 01/26/2023   Urine Drug Screen:     Component Value Date/Time   LABOPIA NONE DETECTED 01/27/2023 1800   COCAINSCRNUR  NONE DETECTED 01/27/2023 1800   COCAINSCRNUR NONE DETECTED 02/20/2022 1652   LABBENZ POSITIVE (A) 01/27/2023 1800   AMPHETMU NONE DETECTED 01/27/2023 1800   THCU NONE DETECTED 01/27/2023 1800   LABBARB POSITIVE (A) 01/27/2023 1800    Alcohol Level     Component Value Date/Time   ETH <10 03/09/2023 1424   INR  Lab Results  Component Value Date   INR 1.1 03/09/2023   APTT  Lab Results  Component Value Date   APTT 29 03/09/2023    Impression   John Spears is a 73 y.o. male with a history of seizures who had multiple breakthrough seizures despite being on a good dose of Keppra.  I do think he needs a second antiepileptic at this point and I have added Vimpat.  Recommendations  Continue Keppra 1500 twice daily Add lacosamide 100 twice daily Neurology will continue to follow ______________________________________________________________________   Thank you for the opportunity to take part in the care of this patient. If you have any further questions, please contact the neurology consultation team on call. Updated oncall schedule is listed on AMION.  Signed,  Ritta Slot, MD [facility] [phone]  If 7pm- 7am, please page neurology on call as listed in AMION.

## 2023-03-12 NOTE — Plan of Care (Signed)
  Problem: Clinical Measurements: Goal: Cardiovascular complication will be avoided Outcome: Progressing   Problem: Nutrition: Goal: Adequate nutrition will be maintained 03/12/2023 1915 by Amador Cunas, RN Outcome: Progressing 03/12/2023 1913 by Amador Cunas, RN Outcome: Progressing   Problem: Coping: Goal: Level of anxiety will decrease Outcome: Progressing   Problem: Elimination: Goal: Will not experience complications related to urinary retention Outcome: Progressing   Problem: Pain Managment: Goal: General experience of comfort will improve and/or be controlled Outcome: Progressing

## 2023-03-13 DIAGNOSIS — R569 Unspecified convulsions: Secondary | ICD-10-CM | POA: Diagnosis not present

## 2023-03-13 LAB — CBC WITH DIFFERENTIAL/PLATELET
Abs Immature Granulocytes: 0.03 10*3/uL (ref 0.00–0.07)
Basophils Absolute: 0 10*3/uL (ref 0.0–0.1)
Basophils Relative: 0 %
Eosinophils Absolute: 0.2 10*3/uL (ref 0.0–0.5)
Eosinophils Relative: 2 %
HCT: 28.4 % — ABNORMAL LOW (ref 39.0–52.0)
Hemoglobin: 9.4 g/dL — ABNORMAL LOW (ref 13.0–17.0)
Immature Granulocytes: 0 %
Lymphocytes Relative: 27 %
Lymphs Abs: 2.2 10*3/uL (ref 0.7–4.0)
MCH: 28.4 pg (ref 26.0–34.0)
MCHC: 33.1 g/dL (ref 30.0–36.0)
MCV: 85.8 fL (ref 80.0–100.0)
Monocytes Absolute: 1.1 10*3/uL — ABNORMAL HIGH (ref 0.1–1.0)
Monocytes Relative: 13 %
Neutro Abs: 4.7 10*3/uL (ref 1.7–7.7)
Neutrophils Relative %: 58 %
Platelets: 185 10*3/uL (ref 150–400)
RBC: 3.31 MIL/uL — ABNORMAL LOW (ref 4.22–5.81)
RDW: 14.8 % (ref 11.5–15.5)
WBC: 8.2 10*3/uL (ref 4.0–10.5)
nRBC: 0 % (ref 0.0–0.2)

## 2023-03-13 LAB — GLUCOSE, CAPILLARY: Glucose-Capillary: 101 mg/dL — ABNORMAL HIGH (ref 70–99)

## 2023-03-13 LAB — COMPREHENSIVE METABOLIC PANEL
ALT: 16 U/L (ref 0–44)
AST: 21 U/L (ref 15–41)
Albumin: 3 g/dL — ABNORMAL LOW (ref 3.5–5.0)
Alkaline Phosphatase: 53 U/L (ref 38–126)
Anion gap: 9 (ref 5–15)
BUN: 15 mg/dL (ref 8–23)
CO2: 23 mmol/L (ref 22–32)
Calcium: 9.2 mg/dL (ref 8.9–10.3)
Chloride: 104 mmol/L (ref 98–111)
Creatinine, Ser: 1.11 mg/dL (ref 0.61–1.24)
GFR, Estimated: >60 mL/min (ref >60)
Glucose, Bld: 97 mg/dL (ref 70–99)
Potassium: 3.6 mmol/L (ref 3.5–5.1)
Sodium: 136 mmol/L (ref 135–145)
Total Bilirubin: 0.8 mg/dL (ref 0.0–1.2)
Total Protein: 6 g/dL — ABNORMAL LOW (ref 6.5–8.1)

## 2023-03-13 LAB — MAGNESIUM: Magnesium: 1.6 mg/dL — ABNORMAL LOW (ref 1.7–2.4)

## 2023-03-13 LAB — PHOSPHORUS: Phosphorus: 3.3 mg/dL (ref 2.5–4.6)

## 2023-03-13 MED ORDER — LACOSAMIDE 50 MG PO TABS: 100.0000 mg | ORAL_TABLET | Freq: Two times a day (BID) | ORAL | Status: DC

## 2023-03-13 MED ORDER — LEVETIRACETAM IN NACL 1500 MG/100ML IV SOLN
1500.0000 mg | Freq: Two times a day (BID) | INTRAVENOUS | Status: DC
  Administered 2023-03-13 – 2023-03-14 (×3): 1500 mg via INTRAVENOUS
  Filled 2023-03-13 (×3): qty 100

## 2023-03-13 MED ORDER — LAMOTRIGINE 100 MG PO TABS: 100.0000 mg | ORAL_TABLET | Freq: Two times a day (BID) | ORAL | Status: DC

## 2023-03-13 MED ORDER — SODIUM CHLORIDE 0.9 % IV SOLN
150.0000 mg | Freq: Two times a day (BID) | INTRAVENOUS | Status: DC
  Administered 2023-03-13 – 2023-03-14 (×3): 150 mg via INTRAVENOUS
  Filled 2023-03-13 (×5): qty 15

## 2023-03-13 MED ORDER — MAGNESIUM SULFATE 4 GM/100ML IV SOLN
4.0000 g | Freq: Once | INTRAVENOUS | Status: AC
  Administered 2023-03-13: 4 g via INTRAVENOUS
  Filled 2023-03-13: qty 100

## 2023-03-13 MED ORDER — LEVETIRACETAM 750 MG PO TABS: 1500.0000 mg | ORAL_TABLET | Freq: Two times a day (BID) | ORAL | Status: DC

## 2023-03-13 NOTE — Plan of Care (Signed)
  Problem: Education: Goal: Knowledge of General Education information will improve Description: Including pain rating scale, medication(s)/side effects and non-pharmacologic comfort measures Outcome: Progressing   Problem: Health Behavior/Discharge Planning: Goal: Ability to manage health-related needs will improve Outcome: Progressing   Problem: Clinical Measurements: Goal: Ability to maintain clinical measurements within normal limits will improve Outcome: Progressing Goal: Will remain free from infection Outcome: Progressing Goal: Diagnostic test results will improve Outcome: Progressing Goal: Respiratory complications will improve Outcome: Progressing Goal: Cardiovascular complication will be avoided Outcome: Progressing   Problem: Nutrition: Goal: Adequate nutrition will be maintained Outcome: Progressing   Problem: Coping: Goal: Level of anxiety will decrease Outcome: Progressing   Problem: Elimination: Goal: Will not experience complications related to bowel motility Outcome: Progressing Goal: Will not experience complications related to urinary retention Outcome: Progressing   Problem: Pain Managment: Goal: General experience of comfort will improve and/or be controlled Outcome: Progressing   Problem: Safety: Goal: Ability to remain free from injury will improve Outcome: Progressing   Problem: Skin Integrity: Goal: Risk for impaired skin integrity will decrease Outcome: Progressing   Problem: Education: Goal: Expressions of having a comfortable level of knowledge regarding the disease process will increase Outcome: Progressing   Problem: Coping: Goal: Ability to adjust to condition or change in health will improve Outcome: Progressing Goal: Ability to identify appropriate support needs will improve Outcome: Progressing   Problem: Health Behavior/Discharge Planning: Goal: Compliance with prescribed medication regimen will improve Outcome:  Progressing   Problem: Medication: Goal: Risk for medication side effects will decrease Outcome: Progressing   Problem: Clinical Measurements: Goal: Complications related to the disease process, condition or treatment will be avoided or minimized Outcome: Progressing Goal: Diagnostic test results will improve Outcome: Progressing   Problem: Safety: Goal: Verbalization of understanding the information provided will improve Outcome: Progressing   Problem: Self-Concept: Goal: Level of anxiety will decrease Outcome: Progressing Goal: Ability to verbalize feelings about condition will improve Outcome: Progressing

## 2023-03-13 NOTE — Progress Notes (Signed)
PROGRESS NOTE    John Spears  JWJ:[id number] DOB: 11-Jun-1950 DOA: 03/09/2023 PCP: Patient, No Pcp Per    Brief Narrative:  73 year old with history of alcohol abuse currently in remission, diastolic dysfunction, hypertension, seizure disorder, history of stroke, PAF on Eliquis recently admitted to the hospital with seizure and stroke, discharged to SNF went home and brought back to the emergency room with right-sided weakness, facial droop, aphasia and confusion with suspected to have seizure at home.  Lives at home with sister.  Apparently currently on Keppra 1000 mg twice daily.  In the emergency room hemodynamically stable.  CT head, CT angiogram head and neck with no acute findings.  Stat EEG without active seizure.  Admitted with neurology consultation.  1/17, patient was preparing to be discharged to a SNF, however he had 3 short lasting generalized tonic-clonic seizure witnessed in the hospital.  Also low-grade fever and found to have rhinovirus infection.  Subjective:  Patient seen and examined.  No overnight events since last seizure 24 hours ago.  He is more awake but is still with flat affect.  Able to take by mouth without difficulties.  Assessment & Plan:   Breakthrough seizure in a patient with seizure disorder: Extensively investigated last month during ICU admissions.  Discharged on Keppra 1500 mg twice daily, at home on Keppra 1000 mg twice daily. As per neurology recommendation, loaded with Keppra 2500 mg, will continue 1500 mg twice daily . Patient was witnessed to have 3 consecutive seizures while inpatient despite being on Keppra.  Loaded with Vimpat.  Vimpat 100 mg twice daily restarted.  Neurology reconsulted. Continue seizure precautions. SNF once stabilized. Will change Keppra and Vimpat to oral today and monitor in the hospital before discharging to a SNF.   Acute metabolic encephalopathy: Secondary to seizure.  No evidence of bacterial infection.   Intermittent confusion and sleepiness due to seizure.   Paroxysmal A-fib: Currently rate controlled in sinus rhythm.  On Coreg and Eliquis.   Chronic diastolic heart failure: Euvolemic on exam.   CKD stage IIIa: Fairly stable.    Hypomagnesemia: Will replace today.   DVT prophylaxis:  apixaban (ELIQUIS) tablet 5 mg   Code Status: Full code Family Communication: None today, sister updated on 1/17. Disposition Plan: Status is: Inpatient Remains inpatient appropriate because: Active seizure treatment     Consultants:  Neurology  Procedures:  EEG  Antimicrobials:  None     Objective: Vitals:   03/13/23 0051 03/13/23 0406 03/13/23 0801 03/13/23 1126  BP: 125/65 122/75 (!) 158/81 135/67  Pulse: 66 65 68 66  Resp:      Temp: 99.1 F (37.3 C) 99.1 F (37.3 C) 99.2 F (37.3 C) 99 F (37.2 C)  TempSrc: Axillary Axillary Oral Oral  SpO2: 99% 100% 100% 100%  Weight:      Height:        Intake/Output Summary (Last 24 hours) at 03/13/2023 1130 Last data filed at 03/13/2023 0400 Gross per 24 hour  Intake 935.11 ml  Output 3 ml  Net 932.11 ml   Filed Weights   03/09/23 1400  Weight: 79.4 kg    Examination:  General exam: Flat affect but interactive and answers questions appropriately.  On room air.  Not in any distress. Respiratory system: Clear to auscultation. Respiratory effort normal.  No added sounds. Cardiovascular system: S1 & S2 heard, RRR.  Gastrointestinal system: Soft.  Nontender.  Bowel sound present. Central nervous system: Alert, awake and mostly oriented.  Flat affect.  Extremities: Right upper and lower extremity weaker than left.      Data Reviewed: I have personally reviewed following labs and imaging studies  CBC: Recent Labs  Lab 03/09/23 1424 03/09/23 1426 03/10/23 0453 03/13/23 0628  WBC 8.8  --  9.9 8.2  NEUTROABS 6.1  --   --  4.7  HGB 12.3* 13.6 11.2* 9.4*  HCT 37.6* 40.0 33.9* 28.4*  MCV 88.3  --  88.1 85.8  PLT 191  --   192 185   Basic Metabolic Panel: Recent Labs  Lab 03/09/23 1424 03/09/23 1426 03/09/23 1846 03/10/23 0500 03/13/23 0628  NA 140 139  --  139 136  K 4.2 4.1  --  3.9 3.6  CL 110 112*  --  107 104  CO2 19*  --   --  20* 23  GLUCOSE 98 95  --  85 97  BUN 15 16  --  11 15  CREATININE 1.08 1.10  --  0.98 1.11  CALCIUM 9.4  --   --  9.4 9.2  MG  --   --  1.6*  --  1.6*  PHOS  --   --   --   --  3.3   GFR: Estimated Creatinine Clearance: 66 mL/min (by C-G formula based on SCr of 1.11 mg/dL). Liver Function Tests: Recent Labs  Lab 03/09/23 1424 03/13/23 0628  AST 20 21  ALT 12 16  ALKPHOS 83 53  BILITOT 0.7 0.8  PROT 6.7 6.0*  ALBUMIN 3.6 3.0*   No results for input(s): "LIPASE", "AMYLASE" in the last 168 hours. Recent Labs  Lab 03/09/23 1846  AMMONIA 23   Coagulation Profile: Recent Labs  Lab 03/09/23 1424  INR 1.1   Cardiac Enzymes: Recent Labs  Lab 03/09/23 1846  CKTOTAL 37*   BNP (last 3 results) No results for input(s): "PROBNP" in the last 8760 hours. HbA1C: No results for input(s): "HGBA1C" in the last 72 hours. CBG: Recent Labs  Lab 03/09/23 1423 03/12/23 0838  GLUCAP 88 116*   Lipid Profile: No results for input(s): "CHOL", "HDL", "LDLCALC", "TRIG", "CHOLHDL", "LDLDIRECT" in the last 72 hours. Thyroid Function Tests: No results for input(s): "TSH", "T4TOTAL", "FREET4", "T3FREE", "THYROIDAB" in the last 72 hours.  Anemia Panel: No results for input(s): "VITAMINB12", "FOLATE", "FERRITIN", "TIBC", "IRON", "RETICCTPCT" in the last 72 hours.  Sepsis Labs: No results for input(s): "PROCALCITON", "LATICACIDVEN" in the last 168 hours.  Recent Results (from the past 240 hours)  Respiratory (~20 pathogens) panel by PCR     Status: Abnormal   Collection Time: 03/12/23  7:41 AM   Specimen: Nasopharyngeal Swab; Respiratory  Result Value Ref Range Status   Adenovirus NOT DETECTED NOT DETECTED Final   Coronavirus 229E NOT DETECTED NOT DETECTED Final     Comment: (NOTE) The Coronavirus on the Respiratory Panel, DOES NOT test for the novel  Coronavirus (2019 nCoV)    Coronavirus HKU1 NOT DETECTED NOT DETECTED Final   Coronavirus NL63 NOT DETECTED NOT DETECTED Final   Coronavirus OC43 NOT DETECTED NOT DETECTED Final   Metapneumovirus NOT DETECTED NOT DETECTED Final   Rhinovirus / Enterovirus DETECTED (A) NOT DETECTED Final   Influenza A NOT DETECTED NOT DETECTED Final   Influenza B NOT DETECTED NOT DETECTED Final   Parainfluenza Virus 1 NOT DETECTED NOT DETECTED Final   Parainfluenza Virus 2 NOT DETECTED NOT DETECTED Final   Parainfluenza Virus 3 NOT DETECTED NOT DETECTED Final   Parainfluenza Virus 4 NOT DETECTED NOT DETECTED  Final   Respiratory Syncytial Virus NOT DETECTED NOT DETECTED Final   Bordetella pertussis NOT DETECTED NOT DETECTED Final   Bordetella Parapertussis NOT DETECTED NOT DETECTED Final   Chlamydophila pneumoniae NOT DETECTED NOT DETECTED Final   Mycoplasma pneumoniae NOT DETECTED NOT DETECTED Final    Comment: Performed at [facility], [address], [place], [place] [address]  SARS Coronavirus 2 by RT PCR (hospital order, performed in [facility] hospital lab) *cepheid single result test* Anterior Nasal Swab     Status: None   Collection Time: 03/12/23  7:41 AM   Specimen: Anterior Nasal Swab  Result Value Ref Range Status   SARS Coronavirus 2 by RT PCR NEGATIVE NEGATIVE Final    Comment: Performed at [facility], [address], [place], [place] [address]  Culture, blood (Routine X 2) w Reflex to ID Panel     Status: None (Preliminary result)   Collection Time: 03/12/23  8:11 AM   Specimen: BLOOD  Result Value Ref Range Status   Specimen Description BLOOD BLOOD LEFT ARM  Final   Special Requests   Final    BOTTLES DRAWN AEROBIC AND ANAEROBIC Blood Culture adequate volume   Culture   Final    NO GROWTH < 24 HOURS Performed at [facility], [address], [place], [place] [address]     Report Status PENDING  Incomplete  Culture, blood (Routine X 2) w Reflex to ID Panel     Status: None (Preliminary result)   Collection Time: 03/12/23  8:11 AM   Specimen: BLOOD  Result Value Ref Range Status   Specimen Description BLOOD BLOOD LEFT ARM  Final   Special Requests   Final    BOTTLES DRAWN AEROBIC AND ANAEROBIC Blood Culture adequate volume   Culture   Final    NO GROWTH < 24 HOURS Performed at [facility], [address], [place], [place] [address]    Report Status PENDING  Incomplete         Radiology Studies: No results found.      Scheduled Meds:  amLODipine  10 mg Oral QHS   apixaban  5 mg Oral BID   aspirin EC  81 mg Oral QPM   atorvastatin  40 mg Oral QHS   carvedilol  25 mg Oral BID WC   cyanocobalamin  1,000 mcg Oral q AM   feeding supplement  237 mL Oral BID BM   folic acid  1 mg Oral q AM   furosemide  20 mg Oral Daily   lamoTRIgine  100 mg Oral BID   levETIRAcetam  1,500 mg Oral BID   magnesium oxide  400 mg Oral Daily   multivitamin with minerals  1 tablet Oral QHS   sodium chloride flush  3 mL Intravenous Once   tamsulosin  0.4 mg Oral QHS   thiamine  100 mg Oral q AM   Continuous Infusions:     LOS: 2 days    Time spent: 40 minutes    Dorcas Carrow, MD [facility]

## 2023-03-13 NOTE — Progress Notes (Signed)
pt had 30-45 second seizure right after using urinal after he sat back on the bed was not responding to verbal cues blank stare and shaking of BUEs VS 140/74 93 map 98.8 temp 64 pulse 100 RA 16 resp 101 cbg  Md informed

## 2023-03-13 NOTE — Progress Notes (Signed)
NEUROLOGY CONSULT FOLLOW UP NOTE   Date of service: March 13, 2023 Patient Name: John Spears MRN:  [id number] DOB:  04-09-1950  Brief HPI  John Spears is a 73 y.o. male with a history of previous stroke with right arm weakness previous seizure disorder as well as alcohol abuse who presented on 03/09/2023 with breakthrough seizure.  I had seen him previously in December and at the time had recommended going to 1500 twice daily on his Keppra, but it appears he has been taking 1 g twice daily.   Interval Hx/subjective   No further seizures, much improved again Vitals   Vitals:   03/13/23 0801 03/13/23 1126 03/13/23 1534 03/13/23 1836  BP: (!) 158/81 135/67 135/69 (!) 140/74  Pulse: 68 66 69 64  Resp:      Temp: 99.2 F (37.3 C) 99 F (37.2 C) 97.7 F (36.5 C) 98.8 F (37.1 C)  TempSrc: Oral Oral Axillary Oral  SpO2: 100% 100% 100% 100%  Weight:      Height:         Body mass index is 24.41 kg/m.  Physical Exam   Constitutional: Appears well-developed and well-nourished.   Neurologic Examination   Gen: in bed, NAD  Neuro: MS: Awake, alert, he does have hesitancy of speech but is able to answer questions.  He tells me that he is in the hospital, but unable to give month or year.  CN: EOMI Motor: He has a mild right hemiparesis, holds his right hand clenched, good strength of the left Sensory: Intact to light touch  Labs and Diagnostic Imaging   CBC:  Recent Labs  Lab 03/09/23 1424 03/09/23 1426 03/10/23 0453 03/13/23 0628  WBC 8.8  --  9.9 8.2  NEUTROABS 6.1  --   --  4.7  HGB 12.3*   < > 11.2* 9.4*  HCT 37.6*   < > 33.9* 28.4*  MCV 88.3  --  88.1 85.8  PLT 191  --  192 185   < > = values in this interval not displayed.    Basic Metabolic Panel:  Lab Results  Component Value Date   NA 136 03/13/2023   K 3.6 03/13/2023   CO2 23 03/13/2023   GLUCOSE 97 03/13/2023   BUN 15 03/13/2023   CREATININE 1.11 03/13/2023   CALCIUM 9.2 03/13/2023    GFRNONAA >60 03/13/2023   GFRAA >60 04/04/2019   Lipid Panel:  Lab Results  Component Value Date   LDLCALC 133 (H) 03/08/2019   HgbA1c:  Lab Results  Component Value Date   HGBA1C 5.3 01/26/2023   Urine Drug Screen:     Component Value Date/Time   LABOPIA NONE DETECTED 01/27/2023 1800   COCAINSCRNUR NONE DETECTED 01/27/2023 1800   COCAINSCRNUR NONE DETECTED 02/20/2022 1652   LABBENZ POSITIVE (A) 01/27/2023 1800   AMPHETMU NONE DETECTED 01/27/2023 1800   THCU NONE DETECTED 01/27/2023 1800   LABBARB POSITIVE (A) 01/27/2023 1800     Imaging(Personally reviewed):  CT/CTA-negative  Impression   John Spears is a 73 y.o. male with a history of seizures with breakthrough seizure.   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  He had not had any further seizures when I saw him earlier, but prior to my finalizing the note, he had a recurrent episode.  My suspicion is that he has a lower seizure threshold due to  his cold, I will increase his Vimpat to 150 twice daily.  Recommendations  Keppra 1.5 g twice daily Increase Vimpat to 150 twice daily ______________________________________________________________________   Thank you for the opportunity to take part in the care of this patient. If you have any further questions, please contact the neurology consultation team on call. Updated oncall schedule is listed on AMION.  Signed,  Ritta Slot [facility]

## 2023-03-14 DIAGNOSIS — R569 Unspecified convulsions: Secondary | ICD-10-CM | POA: Diagnosis not present

## 2023-03-14 MED ORDER — LORAZEPAM 2 MG/ML IJ SOLN: 2.0000 mg | INTRAMUSCULAR | Status: DC | PRN

## 2023-03-14 NOTE — Progress Notes (Signed)
PROGRESS NOTE    John Spears  EPP:[id number] DOB: 1950-09-03 DOA: 03/09/2023 PCP: Patient, No Pcp Per    Brief Narrative:  73 year old with history of alcohol abuse currently in remission, diastolic dysfunction, hypertension, seizure disorder, history of stroke, PAF on Eliquis recently admitted to the hospital with seizure and stroke, discharged to SNF went home and brought back to the emergency room with right-sided weakness, facial droop, aphasia and confusion with suspected to have seizure at home.  Lives at home with sister.  Apparently currently on Keppra 1000 mg twice daily.  In the emergency room hemodynamically stable.  CT head, CT angiogram head and neck with no acute findings.  Stat EEG without active seizure.  Admitted with neurology consultation.  1/17, patient was preparing to be discharged to a SNF, however he had 3 short lasting generalized tonic-clonic seizure witnessed in the hospital.  Also low-grade fever and found to have rhinovirus infection. 1/18, another episode of seizure-like activity after walking back from bathroom.  Subjective:  Patient seen and examined.  Denies any complaints.  No overnight events.  Last night seizure happened after activity, will check orthostatic symptoms.  Assessment & Plan:   Breakthrough seizure in a patient with seizure disorder: Extensively investigated last month during ICU admissions.  At home on Keppra 1000 mg twice daily.  Admitted with witnessed seizure.  Started on Keppra 1500 mg twice daily, continued to have seizure in the hospital.  Vimpat 100 mg twice daily added, he had additional seizure even after that.  Now on Keppra 1500 mg twice daily, Vimpat 150 mg twice daily.  Will continue IV doses today to ensure adequate medications. Check orthostatics. Continue seizure precautions. SNF once stabilized.   Acute metabolic encephalopathy: Secondary to seizure.  No evidence of bacterial infection.  Intermittent confusion and  sleepiness due to seizure.  Improved.   Paroxysmal A-fib: Currently rate controlled in sinus rhythm.  On Coreg and Eliquis.   Chronic diastolic heart failure: Euvolemic on exam.   CKD stage IIIa: Fairly stable.    Hypomagnesemia: Replaced and adequate.   DVT prophylaxis:  apixaban (ELIQUIS) tablet 5 mg   Code Status: Full code Family Communication: None today. Disposition Plan: Status is: Inpatient Remains inpatient appropriate because: Active seizure treatment and monitoring.  He will go to a skilled nursing facility once stable.     Consultants:  Neurology  Procedures:  EEG  Antimicrobials:  None     Objective: Vitals:   03/13/23 2021 03/14/23 0004 03/14/23 0337 03/14/23 0757  BP: (!) 159/66 132/69 (!) 120/57 (!) 141/72  Pulse: 71 63 62 63  Resp: 18 20 17    Temp: 98.7 F (37.1 C) 98.8 F (37.1 C) 98.2 F (36.8 C) 98.7 F (37.1 C)  TempSrc: Oral Oral Oral Oral  SpO2: 100% 99% 99% 100%  Weight:      Height:        Intake/Output Summary (Last 24 hours) at 03/14/2023 1057 Last data filed at 03/14/2023 0300 Gross per 24 hour  Intake 605 ml  Output 1350 ml  Net -745 ml   Filed Weights   03/09/23 1400  Weight: 79.4 kg    Examination:  General exam: interactive and answers questions appropriately.  On room air.  Not in any distress.  Chronically sick looking. Respiratory system: Clear to auscultation. Respiratory effort normal.  No added sounds. Cardiovascular system: S1 & S2 heard, RRR.  Gastrointestinal system: Soft.  Nontender.  Bowel sound present. Central nervous system: Alert, awake and mostly  oriented.  Flat affect.   Extremities: Right upper and lower extremity weaker than left.      Data Reviewed: I have personally reviewed following labs and imaging studies  CBC: Recent Labs  Lab 03/09/23 1424 03/09/23 1426 03/10/23 0453 03/13/23 0628  WBC 8.8  --  9.9 8.2  NEUTROABS 6.1  --   --  4.7  HGB 12.3* 13.6 11.2* 9.4*  HCT 37.6* 40.0  33.9* 28.4*  MCV 88.3  --  88.1 85.8  PLT 191  --  192 185   Basic Metabolic Panel: Recent Labs  Lab 03/09/23 1424 03/09/23 1426 03/09/23 1846 03/10/23 0500 03/13/23 0628  NA 140 139  --  139 136  K 4.2 4.1  --  3.9 3.6  CL 110 112*  --  107 104  CO2 19*  --   --  20* 23  GLUCOSE 98 95  --  85 97  BUN 15 16  --  11 15  CREATININE 1.08 1.10  --  0.98 1.11  CALCIUM 9.4  --   --  9.4 9.2  MG  --   --  1.6*  --  1.6*  PHOS  --   --   --   --  3.3   GFR: Estimated Creatinine Clearance: 66 mL/min (by C-G formula based on SCr of 1.11 mg/dL). Liver Function Tests: Recent Labs  Lab 03/09/23 1424 03/13/23 0628  AST 20 21  ALT 12 16  ALKPHOS 83 53  BILITOT 0.7 0.8  PROT 6.7 6.0*  ALBUMIN 3.6 3.0*   No results for input(s): "LIPASE", "AMYLASE" in the last 168 hours. Recent Labs  Lab 03/09/23 1846  AMMONIA 23   Coagulation Profile: Recent Labs  Lab 03/09/23 1424  INR 1.1   Cardiac Enzymes: Recent Labs  Lab 03/09/23 1846  CKTOTAL 37*   BNP (last 3 results) No results for input(s): "PROBNP" in the last 8760 hours. HbA1C: No results for input(s): "HGBA1C" in the last 72 hours. CBG: Recent Labs  Lab 03/09/23 1423 03/12/23 0838 03/13/23 1835  GLUCAP 88 116* 101*   Lipid Profile: No results for input(s): "CHOL", "HDL", "LDLCALC", "TRIG", "CHOLHDL", "LDLDIRECT" in the last 72 hours. Thyroid Function Tests: No results for input(s): "TSH", "T4TOTAL", "FREET4", "T3FREE", "THYROIDAB" in the last 72 hours.  Anemia Panel: No results for input(s): "VITAMINB12", "FOLATE", "FERRITIN", "TIBC", "IRON", "RETICCTPCT" in the last 72 hours.  Sepsis Labs: No results for input(s): "PROCALCITON", "LATICACIDVEN" in the last 168 hours.  Recent Results (from the past 240 hours)  Respiratory (~20 pathogens) panel by PCR     Status: Abnormal   Collection Time: 03/12/23  7:41 AM   Specimen: Nasopharyngeal Swab; Respiratory  Result Value Ref Range Status   Adenovirus NOT  DETECTED NOT DETECTED Final   Coronavirus 229E NOT DETECTED NOT DETECTED Final    Comment: (NOTE) The Coronavirus on the Respiratory Panel, DOES NOT test for the novel  Coronavirus (2019 nCoV)    Coronavirus HKU1 NOT DETECTED NOT DETECTED Final   Coronavirus NL63 NOT DETECTED NOT DETECTED Final   Coronavirus OC43 NOT DETECTED NOT DETECTED Final   Metapneumovirus NOT DETECTED NOT DETECTED Final   Rhinovirus / Enterovirus DETECTED (A) NOT DETECTED Final   Influenza A NOT DETECTED NOT DETECTED Final   Influenza B NOT DETECTED NOT DETECTED Final   Parainfluenza Virus 1 NOT DETECTED NOT DETECTED Final   Parainfluenza Virus 2 NOT DETECTED NOT DETECTED Final   Parainfluenza Virus 3 NOT DETECTED NOT DETECTED Final  Parainfluenza Virus 4 NOT DETECTED NOT DETECTED Final   Respiratory Syncytial Virus NOT DETECTED NOT DETECTED Final   Bordetella pertussis NOT DETECTED NOT DETECTED Final   Bordetella Parapertussis NOT DETECTED NOT DETECTED Final   Chlamydophila pneumoniae NOT DETECTED NOT DETECTED Final   Mycoplasma pneumoniae NOT DETECTED NOT DETECTED Final    Comment: Performed at [facility], [address], [place], [place] [address]  SARS Coronavirus 2 by RT PCR (hospital order, performed in [facility] hospital lab) *cepheid single result test* Anterior Nasal Swab     Status: None   Collection Time: 03/12/23  7:41 AM   Specimen: Anterior Nasal Swab  Result Value Ref Range Status   SARS Coronavirus 2 by RT PCR NEGATIVE NEGATIVE Final    Comment: Performed at [facility], [address], [place], [place] [address]  Culture, blood (Routine X 2) w Reflex to ID Panel     Status: None (Preliminary result)   Collection Time: 03/12/23  8:11 AM   Specimen: BLOOD  Result Value Ref Range Status   Specimen Description BLOOD BLOOD LEFT ARM  Final   Special Requests   Final    BOTTLES DRAWN AEROBIC AND ANAEROBIC Blood Culture adequate volume   Culture   Final    NO GROWTH 2  DAYS Performed at [facility], [address] [place], [place] [address]    Report Status PENDING  Incomplete  Culture, blood (Routine X 2) w Reflex to ID Panel     Status: None (Preliminary result)   Collection Time: 03/12/23  8:11 AM   Specimen: BLOOD  Result Value Ref Range Status   Specimen Description BLOOD BLOOD LEFT ARM  Final   Special Requests   Final    BOTTLES DRAWN AEROBIC AND ANAEROBIC Blood Culture adequate volume   Culture   Final    NO GROWTH 2 DAYS Performed at [facility], [address], [place], [place] [address]    Report Status PENDING  Incomplete         Radiology Studies: No results found.      Scheduled Meds:  amLODipine  10 mg Oral QHS   apixaban  5 mg Oral BID   aspirin EC  81 mg Oral QPM   atorvastatin  40 mg Oral QHS   carvedilol  25 mg Oral BID WC   cyanocobalamin  1,000 mcg Oral q AM   feeding supplement  237 mL Oral BID BM   folic acid  1 mg Oral q AM   furosemide  20 mg Oral Daily   magnesium oxide  400 mg Oral Daily   multivitamin with minerals  1 tablet Oral QHS   sodium chloride flush  3 mL Intravenous Once   tamsulosin  0.4 mg Oral QHS   thiamine  100 mg Oral q AM   Continuous Infusions:  lacosamide (VIMPAT) IV 150 mg (03/14/23 1037)   levETIRAcetam 1,500 mg (03/14/23 0814)      LOS: 3 days    Time spent: 40 minutes    Dorcas Carrow, MD [facility]

## 2023-03-14 NOTE — Progress Notes (Signed)
NEUROLOGY CONSULT FOLLOW UP NOTE   Date of service: March 14, 2023 Patient Name: John Spears MRN:  [id number] DOB:  Nov 06, 1950  Brief HPI  Huckson Ravenscroft is a 73 y.o. male with a history of previous stroke with right arm weakness, seizure disorder, as well as alcohol abuse who presented on 03/09/2023 with breakthrough seizure.  Patient seen in December and at the time had recommended going to 1500 twice daily on his Keppra, but it appears he has been taking 1 g twice daily.  Interval Hx/subjective   No further seizures, much improved neurologic evaluation  Vitals   Vitals:   03/13/23 2021 03/14/23 0004 03/14/23 0337 03/14/23 0757  BP: (!) 159/66 132/69 (!) 120/57 (!) 141/72  Pulse: 71 63 62 63  Resp: 18 20 17    Temp: 98.7 F (37.1 C) 98.8 F (37.1 C) 98.2 F (36.8 C) 98.7 F (37.1 C)  TempSrc: Oral Oral Oral Oral  SpO2: 100% 99% 99% 100%  Weight:      Height:        Body mass index is 24.41 kg/m.  Physical Exam   Constitutional: Appears well-developed and well-nourished. Laying in hospital bed in no acute distress.  Respiratory: Non-labored breathing on room air  Neurologic Examination  MS: Awake, alert, he does have hesitancy of speech but is able to answer questions.  He tells me that he is in the hospital due to "something about a stroke".  He states that the year is 2007 then states that it is currently 2008.  He incorrectly states that he is 73 years old. Mildly dysarthric speech in the setting of poor dentition.  Patient names examiner's watch after calling it "arm" and "wristband".  CN: EOMI, tracks examiner. PERRL, face is symmetric resting and with movement, hearing is intact to voice.  Motor: He has a mild right hemiparesis but elevates upper and lower extremities antigravity without vertical drift, good strength of the left.  4/5 grip strength on the right.  Sensory: Intact to light touch throughout  Similar mild aphasia on attending examination later  in the day, mild left gaze preference but able to look fully to the right, mild right hemiparesis  Labs and Diagnostic Imaging   CBC:  Recent Labs  Lab 03/09/23 1424 03/09/23 1426 03/10/23 0453 03/13/23 0628  WBC 8.8  --  9.9 8.2  NEUTROABS 6.1  --   --  4.7  HGB 12.3*   < > 11.2* 9.4*  HCT 37.6*   < > 33.9* 28.4*  MCV 88.3  --  88.1 85.8  PLT 191  --  192 185   < > = values in this interval not displayed.   Basic Metabolic Panel:  Lab Results  Component Value Date   NA 136 03/13/2023   K 3.6 03/13/2023   CO2 23 03/13/2023   GLUCOSE 97 03/13/2023   BUN 15 03/13/2023   CREATININE 1.11 03/13/2023   CALCIUM 9.2 03/13/2023   GFRNONAA >60 03/13/2023   GFRAA >60 04/04/2019   Lipid Panel:  Lab Results  Component Value Date   LDLCALC 133 (H) 03/08/2019   HgbA1c:  Lab Results  Component Value Date   HGBA1C 5.3 01/26/2023   Urine Drug Screen:     Component Value Date/Time   LABOPIA NONE DETECTED 01/27/2023 1800   COCAINSCRNUR NONE DETECTED 01/27/2023 1800   COCAINSCRNUR NONE DETECTED 02/20/2022 1652   LABBENZ POSITIVE (A) 01/27/2023 1800   AMPHETMU NONE DETECTED 01/27/2023 1800   THCU NONE  DETECTED 01/27/2023 1800   LABBARB POSITIVE (A) 01/27/2023 1800    Imaging(Personally reviewed):  CT/CTA-negative  Impression   Kensuke Panzarella is a 73 y.o. male with a history of seizures presenting with breakthrough seizure.   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  Patient had another reported seizure 1/18, suspect that he has a lower seizure threshold due to his cold and his Vimpat was increased to 150 twice daily.  Recommendations  - Keppra 1.5 g twice daily - Continue Vimpat at increased dose of 150 mg BID - Continue seizure precautions - Ativan 2 mg IV as needed seizure activity greater than 5 minutes for up to 2 doses, notify neurology  if used - Neurology will sign off at this time, please notify neurology if there is concern for worsening neurological examination or further seizure activity noting that the patient is expected to gradually improved to baseline with continued supportive care - Discussed with Dr. Jerral Ralph via secure chat ______________________________________________________________________  Everlene Other Per [facility] statutes, patients with seizures are not allowed to drive until they have been seizure-free for six months.   Use caution when using heavy equipment or power tools. Avoid working on ladders or at heights. Take showers instead of baths. Ensure the water temperature is not too high on the home water heater. Do not go swimming alone. Do not lock yourself in a room alone (i.e. bathroom). When caring for infants or small children, sit down when holding, feeding, or changing them to minimize risk of injury to the child in the event you have a seizure. Maintain good sleep hygiene. Avoid alcohol.    If patient has another seizure, call 911 and bring them back to the ED if: A.  The seizure lasts longer than 5 minutes.      B.  The patient doesn't wake shortly after the seizure or has new problems such as difficulty seeing, speaking or moving following the seizure C.  The patient was injured during the seizure D.  The patient has a temperature over 102 F (39C) E.  The patient vomited during the seizure and now is having trouble breathing  Thank you for the opportunity to take part in the care of this patient. If you have any further questions, please contact the neurology consultation team on call. Updated oncall schedule is listed on AMION.  Signed,  Kara Mead [facility] NP  Attending Neurologist's note:  I personally saw this patient, gathering history, performing a full neurologic examination, reviewing relevant labs, personally reviewing relevant imaging including head  CT, and formulated the assessment and plan, adding the note above for completeness and clarity to accurately reflect my thoughts   Brooke Dare MD-PhD [facility] [phone] Available 7 AM to 7 PM, outside these hours please contact Neurologist on call listed on AMION

## 2023-03-14 NOTE — Plan of Care (Signed)
  Problem: Education: Goal: Knowledge of General Education information will improve Description: Including pain rating scale, medication(s)/side effects and non-pharmacologic comfort measures Outcome: Progressing   Problem: Health Behavior/Discharge Planning: Goal: Ability to manage health-related needs will improve Outcome: Progressing   Problem: Clinical Measurements: Goal: Ability to maintain clinical measurements within normal limits will improve Outcome: Progressing Goal: Will remain free from infection Outcome: Progressing Goal: Diagnostic test results will improve Outcome: Progressing Goal: Respiratory complications will improve Outcome: Progressing Goal: Cardiovascular complication will be avoided Outcome: Progressing   Problem: Nutrition: Goal: Adequate nutrition will be maintained Outcome: Progressing   Problem: Coping: Goal: Level of anxiety will decrease Outcome: Progressing   Problem: Elimination: Goal: Will not experience complications related to bowel motility Outcome: Progressing Goal: Will not experience complications related to urinary retention Outcome: Progressing   Problem: Pain Managment: Goal: General experience of comfort will improve and/or be controlled Outcome: Progressing   Problem: Safety: Goal: Ability to remain free from injury will improve Outcome: Progressing   Problem: Skin Integrity: Goal: Risk for impaired skin integrity will decrease Outcome: Progressing   Problem: Education: Goal: Expressions of having a comfortable level of knowledge regarding the disease process will increase Outcome: Progressing   Problem: Coping: Goal: Ability to adjust to condition or change in health will improve Outcome: Progressing Goal: Ability to identify appropriate support needs will improve Outcome: Progressing   Problem: Health Behavior/Discharge Planning: Goal: Compliance with prescribed medication regimen will improve Outcome:  Progressing   Problem: Medication: Goal: Risk for medication side effects will decrease Outcome: Progressing   Problem: Clinical Measurements: Goal: Complications related to the disease process, condition or treatment will be avoided or minimized Outcome: Progressing Goal: Diagnostic test results will improve Outcome: Progressing   Problem: Safety: Goal: Verbalization of understanding the information provided will improve Outcome: Progressing   Problem: Self-Concept: Goal: Level of anxiety will decrease Outcome: Progressing Goal: Ability to verbalize feelings about condition will improve Outcome: Progressing

## 2023-03-15 DIAGNOSIS — R569 Unspecified convulsions: Secondary | ICD-10-CM | POA: Diagnosis not present

## 2023-03-15 MED ORDER — LEVETIRACETAM 750 MG PO TABS
1500.0000 mg | ORAL_TABLET | Freq: Two times a day (BID) | ORAL | Status: DC
  Administered 2023-03-15 – 2023-03-16 (×3): 1500 mg via ORAL
  Filled 2023-03-15 (×3): qty 2

## 2023-03-15 MED ORDER — LACOSAMIDE 50 MG PO TABS
150.0000 mg | ORAL_TABLET | Freq: Two times a day (BID) | ORAL | Status: DC
  Administered 2023-03-15 – 2023-03-16 (×3): 150 mg via ORAL
  Filled 2023-03-15 (×3): qty 3

## 2023-03-15 NOTE — Progress Notes (Signed)
PROGRESS NOTE    John Spears  GMW:[id number] DOB: Oct 26, 1950 DOA: 03/09/2023 PCP: Patient, No Pcp Per    Brief Narrative:  73 year old with history of alcohol abuse currently in remission, diastolic dysfunction, hypertension, seizure disorder, history of stroke, PAF on Eliquis recently admitted to the hospital with seizure and stroke, discharged to SNF went home and brought back to the emergency room with right-sided weakness, facial droop, aphasia and confusion with suspected to have seizure at home.  Lives at home with sister.  Apparently currently on Keppra 1000 mg twice daily.  In the emergency room hemodynamically stable.  CT head, CT angiogram head and neck with no acute findings.  Stat EEG without active seizure.  Admitted with neurology consultation.  1/17, patient was preparing to be discharged to a SNF, however he had 3 short lasting generalized tonic-clonic seizure witnessed in the hospital.  Also low-grade fever and found to have rhinovirus infection. 1/18, another episode of seizure-like activity after walking back from bathroom.  Subjective:  Patient seen and examined.  No overnight events.  No episodes of seizure after the last episode on 1/18 evening.  Afebrile.   Assessment & Plan:   Breakthrough seizure in a patient with seizure disorder: Extensively investigated last month during ICU admissions.  At home on Keppra 1000 mg twice daily.  Admitted with witnessed seizure.  Started on Keppra 1500 mg twice daily, continued to have seizure in the hospital.  Vimpat 100 mg twice daily added, he had additional seizure even after that.  Now on Keppra 1500 mg twice daily, Vimpat 150 mg twice daily.   Will change medications to by mouth today and monitor next 24 hours before discharging to SNF.  Continue seizure precautions.   SNF once stabilized.   Acute metabolic encephalopathy: Secondary to seizure.  Also possibly due to rhinovirus infection.  No evidence of bacterial  infection.  Intermittent confusion and sleepiness due to seizure.  Improved.   Paroxysmal A-fib: Currently rate controlled in sinus rhythm.  On Coreg and Eliquis.  Tolerating.   Chronic diastolic heart failure: Euvolemic on exam.   CKD stage IIIa: Fairly stable.    Hypomagnesemia: Replaced and adequate.  Rhinovirus infection: Symptomatic treatment.   DVT prophylaxis:  apixaban (ELIQUIS) tablet 5 mg   Code Status: Full code Family Communication: None today. Disposition Plan: Status is: Inpatient Remains inpatient appropriate because: Active seizure treatment and monitoring.  He will go to a skilled nursing facility once stable.     Consultants:  Neurology  Procedures:  EEG  Antimicrobials:  None     Objective: Vitals:   03/14/23 2019 03/14/23 2324 03/15/23 0347 03/15/23 0747  BP: (!) 151/75 116/60 136/67 (!) 150/79  Pulse: 69 65 64 66  Resp: 20 18 18 19   Temp: 98.2 F (36.8 C) 98.6 F (37 C) 98.5 F (36.9 C) 98.8 F (37.1 C)  TempSrc: Oral Oral Oral Oral  SpO2: 98% 99% 99% 100%  Weight:      Height:        Intake/Output Summary (Last 24 hours) at 03/15/2023 1134 Last data filed at 03/15/2023 0409 Gross per 24 hour  Intake 700 ml  Output 210 ml  Net 490 ml   Filed Weights   03/09/23 1400  Weight: 79.4 kg    Examination:  General exam: Alert and awake.  He is comfortable and interactive today. Respiratory system: Clear to auscultation. Respiratory effort normal.  No added sounds. Cardiovascular system: S1 & S2 heard, RRR.  Gastrointestinal system:  Soft.  Nontender.  Bowel sound present. Central nervous system: Alert, awake and mostly oriented.  Flat affect.   Extremities: Right upper and lower extremity weaker than left.  Strength better today.    Data Reviewed: I have personally reviewed following labs and imaging studies  CBC: Recent Labs  Lab 03/09/23 1424 03/09/23 1426 03/10/23 0453 03/13/23 0628  WBC 8.8  --  9.9 8.2  NEUTROABS 6.1   --   --  4.7  HGB 12.3* 13.6 11.2* 9.4*  HCT 37.6* 40.0 33.9* 28.4*  MCV 88.3  --  88.1 85.8  PLT 191  --  192 185   Basic Metabolic Panel: Recent Labs  Lab 03/09/23 1424 03/09/23 1426 03/09/23 1846 03/10/23 0500 03/13/23 0628  NA 140 139  --  139 136  K 4.2 4.1  --  3.9 3.6  CL 110 112*  --  107 104  CO2 19*  --   --  20* 23  GLUCOSE 98 95  --  85 97  BUN 15 16  --  11 15  CREATININE 1.08 1.10  --  0.98 1.11  CALCIUM 9.4  --   --  9.4 9.2  MG  --   --  1.6*  --  1.6*  PHOS  --   --   --   --  3.3   GFR: Estimated Creatinine Clearance: 66 mL/min (by C-G formula based on SCr of 1.11 mg/dL). Liver Function Tests: Recent Labs  Lab 03/09/23 1424 03/13/23 0628  AST 20 21  ALT 12 16  ALKPHOS 83 53  BILITOT 0.7 0.8  PROT 6.7 6.0*  ALBUMIN 3.6 3.0*   No results for input(s): "LIPASE", "AMYLASE" in the last 168 hours. Recent Labs  Lab 03/09/23 1846  AMMONIA 23   Coagulation Profile: Recent Labs  Lab 03/09/23 1424  INR 1.1   Cardiac Enzymes: Recent Labs  Lab 03/09/23 1846  CKTOTAL 37*   BNP (last 3 results) No results for input(s): "PROBNP" in the last 8760 hours. HbA1C: No results for input(s): "HGBA1C" in the last 72 hours. CBG: Recent Labs  Lab 03/09/23 1423 03/12/23 0838 03/13/23 1835  GLUCAP 88 116* 101*   Lipid Profile: No results for input(s): "CHOL", "HDL", "LDLCALC", "TRIG", "CHOLHDL", "LDLDIRECT" in the last 72 hours. Thyroid Function Tests: No results for input(s): "TSH", "T4TOTAL", "FREET4", "T3FREE", "THYROIDAB" in the last 72 hours.  Anemia Panel: No results for input(s): "VITAMINB12", "FOLATE", "FERRITIN", "TIBC", "IRON", "RETICCTPCT" in the last 72 hours.  Sepsis Labs: No results for input(s): "PROCALCITON", "LATICACIDVEN" in the last 168 hours.  Recent Results (from the past 240 hours)  Respiratory (~20 pathogens) panel by PCR     Status: Abnormal   Collection Time: 03/12/23  7:41 AM   Specimen: Nasopharyngeal Swab;  Respiratory  Result Value Ref Range Status   Adenovirus NOT DETECTED NOT DETECTED Final   Coronavirus 229E NOT DETECTED NOT DETECTED Final    Comment: (NOTE) The Coronavirus on the Respiratory Panel, DOES NOT test for the novel  Coronavirus (2019 nCoV)    Coronavirus HKU1 NOT DETECTED NOT DETECTED Final   Coronavirus NL63 NOT DETECTED NOT DETECTED Final   Coronavirus OC43 NOT DETECTED NOT DETECTED Final   Metapneumovirus NOT DETECTED NOT DETECTED Final   Rhinovirus / Enterovirus DETECTED (A) NOT DETECTED Final   Influenza A NOT DETECTED NOT DETECTED Final   Influenza B NOT DETECTED NOT DETECTED Final   Parainfluenza Virus 1 NOT DETECTED NOT DETECTED Final   Parainfluenza Virus  2 NOT DETECTED NOT DETECTED Final   Parainfluenza Virus 3 NOT DETECTED NOT DETECTED Final   Parainfluenza Virus 4 NOT DETECTED NOT DETECTED Final   Respiratory Syncytial Virus NOT DETECTED NOT DETECTED Final   Bordetella pertussis NOT DETECTED NOT DETECTED Final   Bordetella Parapertussis NOT DETECTED NOT DETECTED Final   Chlamydophila pneumoniae NOT DETECTED NOT DETECTED Final   Mycoplasma pneumoniae NOT DETECTED NOT DETECTED Final    Comment: Performed at [facility], [address] [place], [place] [address]  SARS Coronavirus 2 by RT PCR (hospital order, performed in [facility] hospital lab) *cepheid single result test* Anterior Nasal Swab     Status: None   Collection Time: 03/12/23  7:41 AM   Specimen: Anterior Nasal Swab  Result Value Ref Range Status   SARS Coronavirus 2 by RT PCR NEGATIVE NEGATIVE Final    Comment: Performed at [facility], [address] [place], [place] [address]  Culture, blood (Routine X 2) w Reflex to ID Panel     Status: None (Preliminary result)   Collection Time: 03/12/23  8:11 AM   Specimen: BLOOD  Result Value Ref Range Status   Specimen Description BLOOD BLOOD LEFT ARM  Final   Special Requests   Final    BOTTLES DRAWN AEROBIC AND ANAEROBIC Blood Culture  adequate volume   Culture   Final    NO GROWTH 3 DAYS Performed at [facility], [address] [place], [place] [address]    Report Status PENDING  Incomplete  Culture, blood (Routine X 2) w Reflex to ID Panel     Status: None (Preliminary result)   Collection Time: 03/12/23  8:11 AM   Specimen: BLOOD  Result Value Ref Range Status   Specimen Description BLOOD BLOOD LEFT ARM  Final   Special Requests   Final    BOTTLES DRAWN AEROBIC AND ANAEROBIC Blood Culture adequate volume   Culture   Final    NO GROWTH 3 DAYS Performed at [facility], [address] [place], [place] [address]    Report Status PENDING  Incomplete         Radiology Studies: No results found.      Scheduled Meds:  amLODipine  10 mg Oral QHS   apixaban  5 mg Oral BID   aspirin EC  81 mg Oral QPM   atorvastatin  40 mg Oral QHS   carvedilol  25 mg Oral BID WC   cyanocobalamin  1,000 mcg Oral q AM   feeding supplement  237 mL Oral BID BM   folic acid  1 mg Oral q AM   furosemide  20 mg Oral Daily   lacosamide  150 mg Oral BID   levETIRAcetam  1,500 mg Oral BID   magnesium oxide  400 mg Oral Daily   multivitamin with minerals  1 tablet Oral QHS   sodium chloride flush  3 mL Intravenous Once   tamsulosin  0.4 mg Oral QHS   thiamine  100 mg Oral q AM   Continuous Infusions:      LOS: 4 days    Time spent: 40 minutes    Dorcas Carrow, MD [facility]

## 2023-03-15 NOTE — Plan of Care (Signed)
  Problem: Education: Goal: Knowledge of General Education information will improve Description: Including pain rating scale, medication(s)/side effects and non-pharmacologic comfort measures Outcome: Progressing   Problem: Health Behavior/Discharge Planning: Goal: Ability to manage health-related needs will improve Outcome: Progressing   Problem: Clinical Measurements: Goal: Ability to maintain clinical measurements within normal limits will improve Outcome: Progressing Goal: Will remain free from infection Outcome: Progressing Goal: Diagnostic test results will improve Outcome: Progressing Goal: Respiratory complications will improve Outcome: Progressing Goal: Cardiovascular complication will be avoided Outcome: Progressing   Problem: Nutrition: Goal: Adequate nutrition will be maintained Outcome: Progressing   Problem: Coping: Goal: Level of anxiety will decrease Outcome: Progressing   Problem: Elimination: Goal: Will not experience complications related to bowel motility Outcome: Progressing Goal: Will not experience complications related to urinary retention Outcome: Progressing   Problem: Pain Managment: Goal: General experience of comfort will improve and/or be controlled Outcome: Progressing   Problem: Safety: Goal: Ability to remain free from injury will improve Outcome: Progressing   Problem: Skin Integrity: Goal: Risk for impaired skin integrity will decrease Outcome: Progressing   Problem: Education: Goal: Expressions of having a comfortable level of knowledge regarding the disease process will increase Outcome: Progressing   Problem: Coping: Goal: Ability to adjust to condition or change in health will improve Outcome: Progressing Goal: Ability to identify appropriate support needs will improve Outcome: Progressing   Problem: Health Behavior/Discharge Planning: Goal: Compliance with prescribed medication regimen will improve Outcome:  Progressing   Problem: Medication: Goal: Risk for medication side effects will decrease Outcome: Progressing   Problem: Clinical Measurements: Goal: Complications related to the disease process, condition or treatment will be avoided or minimized Outcome: Progressing Goal: Diagnostic test results will improve Outcome: Progressing   Problem: Safety: Goal: Verbalization of understanding the information provided will improve Outcome: Progressing   Problem: Self-Concept: Goal: Level of anxiety will decrease Outcome: Progressing Goal: Ability to verbalize feelings about condition will improve Outcome: Progressing

## 2023-03-16 DIAGNOSIS — R569 Unspecified convulsions: Secondary | ICD-10-CM | POA: Diagnosis not present

## 2023-03-16 MED ORDER — LACOSAMIDE 150 MG PO TABS: 150.0000 mg | ORAL_TABLET | Freq: Two times a day (BID) | ORAL | 0 refills | Status: DC

## 2023-03-16 NOTE — Discharge Summary (Signed)
Physician Discharge Summary  John Spears EAV:[id number] DOB: 07-Oct-1950 DOA: 03/09/2023  PCP: Patient, No Pcp Per  Admit date: 03/09/2023 Discharge date: 03/16/2023  Admitted From: Home Disposition: Skilled nursing facility  Recommendations for Outpatient Follow-up:  Follow up with PCP in 1-2 weeks Neurology to schedule follow-up  Home Health: N/A Equipment/Devices: N/A  Discharge Condition: Stable CODE STATUS: Full code Diet recommendation: Regular diet,   Discharge summary: 73 year old with history of alcohol abuse currently in remission, diastolic dysfunction, hypertension, seizure disorder, history of stroke, PAF on Eliquis recently admitted to the hospital with seizure and stroke, discharged to SNF went home and brought back to the emergency room with right-sided weakness, facial droop, aphasia and confusion with suspected to have seizure at home.  Lives at home with sister.  Apparently currently on Keppra 1000 mg twice daily.  In the emergency room hemodynamically stable.  CT head, CT angiogram head and neck with no acute findings.  Stat EEG without active seizure.  Admitted with neurology consultation.  1/17, patient was preparing to be discharged to a SNF, however he had 3 short lasting generalized tonic-clonic seizure witnessed in the hospital.  Also low-grade fever and found to have rhinovirus infection. 1/18, another episode of seizure-like activity after walking back from bathroom. Multiple adjustments were done on his seizure medications and has currently remained stable.  Able to transfer to SNF today.    Assessment & plan of care:   Breakthrough seizure in a patient with seizure disorder: Extensively investigated last month during ICU admissions.  At home on Keppra 1000 mg twice daily.  Admitted with witnessed seizure.  Started on Keppra 1500 mg twice daily, continued to have seizure in the hospital.  Vimpat 100 mg twice daily added, he had additional seizure even  after that.  Now on Keppra 1500 mg twice daily, Vimpat 150 mg twice daily.  Has remained uneventful for last 48 hours.  Continue seizure precautions.  Rehab recommended.  Acute metabolic encephalopathy: Secondary to seizure.  Also possibly due to rhinovirus infection.  No evidence of bacterial infection.  Intermittent confusion and sleepiness due to seizure.  Improved. Mental status has improved.   Paroxysmal A-fib: Currently rate controlled in sinus rhythm.  On Coreg and Eliquis.  Tolerating.   Chronic diastolic heart failure: Euvolemic on exam.   CKD stage IIIa: Fairly stable.     Hypomagnesemia: Replaced and adequate.   Rhinovirus infection: Symptomatic treatment.  Symptomatically improved.  Stable to transfer to a SNF level of care.    Discharge Diagnoses:  Principal Problem:   Seizure [facility]) Active Problems:   Acute encephalopathy   Paroxysmal atrial fibrillation (HCC)   ETOH abuse   Chronic diastolic CHF (congestive heart failure) (HCC)   CKD (chronic kidney disease), stage III [facility])    Discharge Instructions  Discharge Instructions     Diet - low sodium heart healthy   Complete by: As directed    Increase activity slowly   Complete by: As directed    Increase activity slowly   Complete by: As directed       Allergies as of 03/16/2023   No Known Allergies      Medication List     TAKE these medications    acetaminophen 500 MG tablet Commonly known as: TYLENOL Take 1,000 mg by mouth every 6 (six) hours as needed for moderate pain (pain score 4-6).   amLODipine 10 MG tablet Commonly known as: NORVASC Take 10 mg by mouth at bedtime.   apixaban  5 MG Tabs tablet Commonly known as: ELIQUIS Take 1 tablet (5 mg total) by mouth 2 (two) times daily.   ARTHRITIS STRENGTH BC POWDER PO Take 1 Package by mouth every 6 (six) hours as needed (Pain).   aspirin EC 81 MG tablet Take 81 mg by mouth every evening. Swallow whole.   atorvastatin 40 MG  tablet Commonly known as: LIPITOR Take 40 mg by mouth at bedtime.   carvedilol 25 MG tablet Commonly known as: COREG Take 25 mg by mouth 2 (two) times daily.   cyanocobalamin 1000 MCG tablet Commonly known as: VITAMIN B12 Take 1,000 mcg by mouth in the morning.   feeding supplement Liqd Take 237 mLs by mouth 2 (two) times daily between meals.   folic acid 1 MG tablet Commonly known as: FOLVITE Take 1 mg by mouth in the morning.   furosemide 20 MG tablet Commonly known as: LASIX TAKE 1 TABLET BY MOUTH ONCE A DAY   Lacosamide 150 MG Tabs Take 1 tablet (150 mg total) by mouth 2 (two) times daily.   levETIRAcetam 750 MG tablet Commonly known as: KEPPRA Take 2 tablets (1,500 mg total) by mouth 2 (two) times daily. What changed:  medication strength how much to take   magnesium oxide 400 MG tablet Commonly known as: MAG-OX Take 400 mg by mouth daily.   multivitamin with minerals Tabs tablet Take 1 tablet by mouth daily. What changed: when to take this   tamsulosin 0.4 MG Caps capsule Commonly known as: FLOMAX Take 0.4 mg by mouth at bedtime.   thiamine 100 MG tablet Commonly known as: VITAMIN B1 Take 100 mg by mouth in the morning.        Contact information for after-discharge care     Destination     [facility] .   Service: Skilled Nursing Contact information: [address] [place] [address] [phone]                    No Known Allergies  Consultations: Neurology   Procedures/Studies: DG CHEST PORT 1 VIEW Result Date: 03/09/2023 CLINICAL DATA:  Seizure. EXAM: PORTABLE CHEST 1 VIEW COMPARISON:  01/31/2023 FINDINGS: Normal heart size for technique. No focal airspace disease, pleural effusion or pneumothorax. Normal pulmonary vasculature. No acute osseous findings. IMPRESSION: No acute findings. Electronically Signed   By: Narda Rutherford M.D.   On: 03/09/2023 17:55   EEG adult Result Date:  03/09/2023 Charlsie Quest, MD     03/09/2023  5:03 PM Patient Name: John Spears MRN: [id number] Epilepsy Attending: Charlsie Quest Referring Physician/Provider: Lynnae January, NP Date: 03/09/2023 Duration: 26.11 mins Patient history: 73yo F presented with right sided weakness, facial droop, aphasia and confusion. EEG to evaluate for seizure Level of alertness: Awake AEDs during EEG study: Ativan Technical aspects: This EEG study was done with scalp electrodes positioned according to the 10-20 International system of electrode placement. Electrical activity was reviewed with band pass filter of 1-70Hz , sensitivity of 7 uV/mm, display speed of 3mm/sec with a 60Hz  notched filter applied as appropriate. EEG data were recorded continuously and digitally stored.  Video monitoring was available and reviewed as appropriate. Description: The posterior dominant rhythm consists of 8-9 Hz activity of moderate voltage (25-35 uV) seen predominantly in posterior head regions, symmetric and reactive to eye opening and eye closing. EEG showed continuous 2-3Hz  delta slowing in left hemisphere. Hyperventilation and photic stimulation were not performed.   ABNORMALITY - Continuous slow,  left hemisphere IMPRESSION: This study is suggestive of cortical dysfunction arising from left hemisphere likely secondary to underlying structural abnormality, post-ictal state. No seizures or epileptiform discharges were seen throughout the recording. Priyanka Annabelle Harman   CT ANGIO HEAD NECK W WO CM W PERF (CODE STROKE) Result Date: 03/09/2023 CLINICAL DATA:  Neuro deficit, acute, stroke suspected. EXAM: CT ANGIOGRAPHY HEAD AND NECK CT PERFUSION BRAIN TECHNIQUE: Multidetector CT imaging of the head and neck was performed using the standard protocol during bolus administration of intravenous contrast. Multiplanar CT image reconstructions and MIPs were obtained to evaluate the vascular anatomy. Carotid stenosis measurements (when applicable)  are obtained utilizing NASCET criteria, using the distal internal carotid diameter as the denominator. Multiphase CT imaging of the brain was performed following IV bolus contrast injection. Subsequent parametric perfusion maps were calculated using RAPID software. RADIATION DOSE REDUCTION: This exam was performed according to the departmental dose-optimization program which includes automated exposure control, adjustment of the mA and/or kV according to patient size and/or use of iterative reconstruction technique. CONTRAST:  1mL OMNIPAQUE IOHEXOL 350 MG/ML SOLN COMPARISON:  Head CT same day FINDINGS: CTA NECK FINDINGS Aortic arch: Aortic atherosclerosis. Branching pattern is normal without origin stenosis. Right carotid system: Common carotid artery shows scattered plaque but is widely patent to the bifurcation. No bifurcation or ICA bulb plaque. Cervical ICA widely patent. Left carotid system: Common carotid artery widely patent to the bifurcation. Carotid bifurcation is normal. Cervical ICA is normal. Vertebral arteries: Both vertebral artery origins are widely patent. Both vertebral arteries appear normal through the cervical region to the foramen magnum. Skeleton: Mild cervical spondylosis. Other neck: No mass or lymphadenopathy. Upper chest: Lung apices are clear. Review of the MIP images confirms the above findings CTA HEAD FINDINGS Anterior circulation: Both internal carotid arteries are widely patent through the skull base and siphon regions. Ordinary siphon atherosclerotic calcification but no stenosis greater than 30%. The anterior and middle cerebral vessels are patent. No large vessel occlusion or proximal flow limiting stenosis. No aneurysm or vascular malformation. Posterior circulation: Both vertebral arteries patent through the foramen magnum to the basilar artery. No basilar stenosis. Posterior circulation branch vessels are patent. There is stenotic disease in both posterior cerebral arteries.  Venous sinuses: Patent and normal. Anatomic variants: None significant. Review of the MIP images confirms the above findings CT Brain Perfusion Findings: ASPECTS: 10, allowing for the old left parietal infarction. CBF (<30%) Volume: 0mL Perfusion (Tmax>6.0s) volume: 0mL Mismatch Volume: 0mL Infarction Location:None IMPRESSION: 1. No large vessel occlusion or proximal flow limiting stenosis. 2. Aortic atherosclerosis. 3. No carotid bifurcation disease. 4. Posterior cerebral artery stenotic disease bilaterally. 5. Negative perfusion study. Aortic Atherosclerosis (ICD10-I70.0). Electronically Signed   By: Paulina Fusi M.D.   On: 03/09/2023 14:50   CT HEAD CODE STROKE WO CONTRAST Result Date: 03/09/2023 CLINICAL DATA:  Code stroke. Neuro deficit, acute, stroke suspected. EXAM: CT HEAD WITHOUT CONTRAST TECHNIQUE: Contiguous axial images were obtained from the base of the skull through the vertex without intravenous contrast. RADIATION DOSE REDUCTION: This exam was performed according to the departmental dose-optimization program which includes automated exposure control, adjustment of the mA and/or kV according to patient size and/or use of iterative reconstruction technique. COMPARISON:  01/26/2023 FINDINGS: Brain: No focal abnormality seen affecting the brainstem or cerebellum. Old small vessel infarctions affect the thalami and cerebral hemispheric white matter. Old left parietal cortical and subcortical infarction. No evidence of acute infarction, mass lesion, hemorrhage, hydrocephalus or extra-axial collection. Vascular: There is  atherosclerotic calcification of the major vessels at the base of the brain. Skull: Negative Sinuses/Orbits: Clear sinuses. Old medial orbital blowout fracture on the right. Other: None ASPECTS (Alberta Stroke Program Early CT Score) - Ganglionic level infarction (caudate, lentiform nuclei, internal capsule, insula, M1-M3 cortex): 7, allowing for the old infarction. - Supraganglionic  infarction (M4-M6 cortex): 3, allowing the old infarction. Total score (0-10 with 10 being normal): 10, allowing for the old infarction. IMPRESSION: 1. No acute CT finding. Old small vessel infarctions of the thalami and cerebral hemispheric white matter. Old left parietal cortical and subcortical infarction. 2. Aspects is 10, allowing for the old infarction. These results were communicated to Dr. Wilford Corner at 2:39 pm on 03/09/2023 by text page via the [facility] messaging system. Electronically Signed   By: Paulina Fusi M.D.   On: 03/09/2023 14:39   (Echo, Carotid, EGD, Colonoscopy, ERCP)    Subjective: Patient seen and examined in the morning rounds.  Denies any complaints.  No other overnight events.  Right hand strength is better today.  He is able to eat with the right hand.   Discharge Exam: Vitals:   03/16/23 0617 03/16/23 0830  BP: (!) 121/59 (!) 141/72  Pulse: 66 68  Resp: 18 17  Temp: 98.6 F (37 C) 98.5 F (36.9 C)  SpO2: 99% 100%   Vitals:   03/15/23 2016 03/15/23 2347 03/16/23 0617 03/16/23 0830  BP: (!) 149/71 (!) 140/73 (!) 121/59 (!) 141/72  Pulse: 66 83 66 68  Resp:   18 17  Temp: 98.7 F (37.1 C) 97.7 F (36.5 C) 98.6 F (37 C) 98.5 F (36.9 C)  TempSrc: Oral Oral Oral Oral  SpO2: 100% 100% 99% 100%  Weight:      Height:        General: Pt is alert, awake, not in acute distress Sitting up at edge of bed and eating breakfast.  Looks comfortable. Cardiovascular: RRR, S1/S2 +, no rubs, no gallops Respiratory: CTA bilaterally, no wheezing, no rhonchi Abdominal: Soft, NT, ND, bowel sounds + Extremities: no edema, no cyanosis Patient does have slight weakness of the right hand but able to use it against gravity.    The results of significant diagnostics from this hospitalization (including imaging, microbiology, ancillary and laboratory) are listed below for reference.     Microbiology: Recent Results (from the past 240 hours)  Respiratory (~20 pathogens) panel by  PCR     Status: Abnormal   Collection Time: 03/12/23  7:41 AM   Specimen: Nasopharyngeal Swab; Respiratory  Result Value Ref Range Status   Adenovirus NOT DETECTED NOT DETECTED Final   Coronavirus 229E NOT DETECTED NOT DETECTED Final    Comment: (NOTE) The Coronavirus on the Respiratory Panel, DOES NOT test for the novel  Coronavirus (2019 nCoV)    Coronavirus HKU1 NOT DETECTED NOT DETECTED Final   Coronavirus NL63 NOT DETECTED NOT DETECTED Final   Coronavirus OC43 NOT DETECTED NOT DETECTED Final   Metapneumovirus NOT DETECTED NOT DETECTED Final   Rhinovirus / Enterovirus DETECTED (A) NOT DETECTED Final   Influenza A NOT DETECTED NOT DETECTED Final   Influenza B NOT DETECTED NOT DETECTED Final   Parainfluenza Virus 1 NOT DETECTED NOT DETECTED Final   Parainfluenza Virus 2 NOT DETECTED NOT DETECTED Final   Parainfluenza Virus 3 NOT DETECTED NOT DETECTED Final   Parainfluenza Virus 4 NOT DETECTED NOT DETECTED Final   Respiratory Syncytial Virus NOT DETECTED NOT DETECTED Final   Bordetella pertussis NOT DETECTED NOT DETECTED  Final   Bordetella Parapertussis NOT DETECTED NOT DETECTED Final   Chlamydophila pneumoniae NOT DETECTED NOT DETECTED Final   Mycoplasma pneumoniae NOT DETECTED NOT DETECTED Final    Comment: Performed at [facility], [address], [place], [place] [address]  SARS Coronavirus 2 by RT PCR (hospital order, performed in [facility] hospital lab) *cepheid single result test* Anterior Nasal Swab     Status: None   Collection Time: 03/12/23  7:41 AM   Specimen: Anterior Nasal Swab  Result Value Ref Range Status   SARS Coronavirus 2 by RT PCR NEGATIVE NEGATIVE Final    Comment: Performed at [facility], [address], [place], [place] [address]  Culture, blood (Routine X 2) w Reflex to ID Panel     Status: None (Preliminary result)   Collection Time: 03/12/23  8:11 AM   Specimen: BLOOD  Result Value Ref Range Status   Specimen Description BLOOD BLOOD  LEFT ARM  Final   Special Requests   Final    BOTTLES DRAWN AEROBIC AND ANAEROBIC Blood Culture adequate volume   Culture   Final    NO GROWTH 3 DAYS Performed at [facility], [address], [place], [place] [address]    Report Status PENDING  Incomplete  Culture, blood (Routine X 2) w Reflex to ID Panel     Status: None (Preliminary result)   Collection Time: 03/12/23  8:11 AM   Specimen: BLOOD  Result Value Ref Range Status   Specimen Description BLOOD BLOOD LEFT ARM  Final   Special Requests   Final    BOTTLES DRAWN AEROBIC AND ANAEROBIC Blood Culture adequate volume   Culture   Final    NO GROWTH 3 DAYS Performed at [facility], [address], [place], [place] [address]    Report Status PENDING  Incomplete     Labs: BNP (last 3 results) Recent Labs    09/25/22 0633  BNP 233.1*   Basic Metabolic Panel: Recent Labs  Lab 03/09/23 1424 03/09/23 1426 03/09/23 1846 03/10/23 0500 03/13/23 0628  NA 140 139  --  139 136  K 4.2 4.1  --  3.9 3.6  CL 110 112*  --  107 104  CO2 19*  --   --  20* 23  GLUCOSE 98 95  --  85 97  BUN 15 16  --  11 15  CREATININE 1.08 1.10  --  0.98 1.11  CALCIUM 9.4  --   --  9.4 9.2  MG  --   --  1.6*  --  1.6*  PHOS  --   --   --   --  3.3   Liver Function Tests: Recent Labs  Lab 03/09/23 1424 03/13/23 0628  AST 20 21  ALT 12 16  ALKPHOS 83 53  BILITOT 0.7 0.8  PROT 6.7 6.0*  ALBUMIN 3.6 3.0*   No results for input(s): "LIPASE", "AMYLASE" in the last 168 hours. Recent Labs  Lab 03/09/23 1846  AMMONIA 23   CBC: Recent Labs  Lab 03/09/23 1424 03/09/23 1426 03/10/23 0453 03/13/23 0628  WBC 8.8  --  9.9 8.2  NEUTROABS 6.1  --   --  4.7  HGB 12.3* 13.6 11.2* 9.4*  HCT 37.6* 40.0 33.9* 28.4*  MCV 88.3  --  88.1 85.8  PLT 191  --  192 185   Cardiac Enzymes: Recent Labs  Lab 03/09/23 1846  CKTOTAL 37*   BNP:  Invalid input(s): "POCBNP" CBG: Recent Labs  Lab 03/09/23 1423 03/12/23 0838  03/13/23 1835  GLUCAP 88 116* 101*   D-Dimer No results for input(s): "DDIMER" in the last 72 hours. Hgb A1c No results for input(s): "HGBA1C" in the last 72 hours. Lipid Profile No results for input(s): "CHOL", "HDL", "LDLCALC", "TRIG", "CHOLHDL", "LDLDIRECT" in the last 72 hours. Thyroid function studies No results for input(s): "TSH", "T4TOTAL", "T3FREE", "THYROIDAB" in the last 72 hours.  Invalid input(s): "FREET3" Anemia work up No results for input(s): "VITAMINB12", "FOLATE", "FERRITIN", "TIBC", "IRON", "RETICCTPCT" in the last 72 hours. Urinalysis    Component Value Date/Time   COLORURINE YELLOW 03/10/2023 0447   APPEARANCEUR CLEAR 03/10/2023 0447   LABSPEC 1.026 03/10/2023 0447   PHURINE 5.0 03/10/2023 0447   GLUCOSEU NEGATIVE 03/10/2023 0447   HGBUR NEGATIVE 03/10/2023 0447   BILIRUBINUR NEGATIVE 03/10/2023 0447   KETONESUR 5 (A) 03/10/2023 0447   PROTEINUR NEGATIVE 03/10/2023 0447   NITRITE NEGATIVE 03/10/2023 0447   LEUKOCYTESUR NEGATIVE 03/10/2023 0447   Sepsis Labs Recent Labs  Lab 03/09/23 1424 03/10/23 0453 03/13/23 0628  WBC 8.8 9.9 8.2   Microbiology Recent Results (from the past 240 hours)  Respiratory (~20 pathogens) panel by PCR     Status: Abnormal   Collection Time: 03/12/23  7:41 AM   Specimen: Nasopharyngeal Swab; Respiratory  Result Value Ref Range Status   Adenovirus NOT DETECTED NOT DETECTED Final   Coronavirus 229E NOT DETECTED NOT DETECTED Final    Comment: (NOTE) The Coronavirus on the Respiratory Panel, DOES NOT test for the novel  Coronavirus (2019 nCoV)    Coronavirus HKU1 NOT DETECTED NOT DETECTED Final   Coronavirus NL63 NOT DETECTED NOT DETECTED Final   Coronavirus OC43 NOT DETECTED NOT DETECTED Final   Metapneumovirus NOT DETECTED NOT DETECTED Final   Rhinovirus / Enterovirus DETECTED (A) NOT DETECTED Final   Influenza A NOT DETECTED NOT DETECTED Final   Influenza B NOT DETECTED NOT DETECTED Final   Parainfluenza Virus 1  NOT DETECTED NOT DETECTED Final   Parainfluenza Virus 2 NOT DETECTED NOT DETECTED Final   Parainfluenza Virus 3 NOT DETECTED NOT DETECTED Final   Parainfluenza Virus 4 NOT DETECTED NOT DETECTED Final   Respiratory Syncytial Virus NOT DETECTED NOT DETECTED Final   Bordetella pertussis NOT DETECTED NOT DETECTED Final   Bordetella Parapertussis NOT DETECTED NOT DETECTED Final   Chlamydophila pneumoniae NOT DETECTED NOT DETECTED Final   Mycoplasma pneumoniae NOT DETECTED NOT DETECTED Final    Comment: Performed at [facility], [address], [place], [place] [address]  SARS Coronavirus 2 by RT PCR (hospital order, performed in [facility] hospital lab) *cepheid single result test* Anterior Nasal Swab     Status: None   Collection Time: 03/12/23  7:41 AM   Specimen: Anterior Nasal Swab  Result Value Ref Range Status   SARS Coronavirus 2 by RT PCR NEGATIVE NEGATIVE Final    Comment: Performed at [facility], [address] [place], [place] [address]  Culture, blood (Routine X 2) w Reflex to ID Panel     Status: None (Preliminary result)   Collection Time: 03/12/23  8:11 AM   Specimen: BLOOD  Result Value Ref Range Status   Specimen Description BLOOD BLOOD LEFT ARM  Final   Special Requests   Final    BOTTLES DRAWN AEROBIC AND ANAEROBIC Blood Culture adequate volume   Culture   Final    NO GROWTH 3 DAYS Performed at [facility], [address], [place], [place] [address]    Report Status PENDING  Incomplete  Culture, blood (Routine X 2) w Reflex to ID Panel     Status: None (Preliminary result)   Collection Time: 03/12/23  8:11 AM   Specimen: BLOOD  Result Value Ref Range Status   Specimen Description BLOOD BLOOD LEFT ARM  Final   Special Requests   Final    BOTTLES DRAWN AEROBIC AND ANAEROBIC Blood Culture adequate volume   Culture   Final    NO GROWTH 3 DAYS Performed at [facility], [address] [place], [place] [address]    Report Status PENDING   Incomplete     Time coordinating discharge: 40 minutes  SIGNED:   Dorcas Carrow, MD  [facility] 03/16/2023, 9:50 AM

## 2023-03-16 NOTE — TOC Transition Note (Signed)
Transition of Care [facility]) - Discharge Note   Patient Details  Name: John Spears MRN: [id number] Date of Birth: [birth year]  Transition of [facility] CM/SW Contact:  Baldemar Lenis, LCSW Phone Number: 03/16/2023, 11:41 AM   Clinical Narrative:   CSW updated by MD that patient is stable for discharge today. CSW updated [facility], bed is still available for patient and [facility] is still approved for today. CSW sent discharge information to [facility] and confirmed they are ready for patient to admit. CSW spoke with sister, Kathie Rhodes, and she is in agreement. Transport arranged with [facility] for next available.  Nurse to call report to [phone].    Final next level of care: Skilled Nursing Facility Barriers to Discharge: Barriers Resolved   Patient Goals and CMS Choice Patient states their goals for this hospitalization and ongoing recovery are:: patient unable to participate in goal setting, not fully oriented CMS Medicare.gov Compare Post Acute Care list provided to:: Patient Represenative (must comment) Choice offered to / list presented to : Sibling  ownership interest in [employer].provided to:: Sibling    Discharge Placement              Patient chooses bed at: [facility] Patient to be transferred to facility by: [facility] Name of family member notified: Kathie Rhodes Patient and family notified of of transfer: 03/16/23  Discharge Plan and Services Additional resources added to the After Visit Summary for       Post Acute Care Choice: Skilled Nursing Facility                               Social Drivers of Health (SDOH) Interventions SDOH Screenings   Food Insecurity: Patient Unable To Answer (03/12/2023)  Housing: Unknown (03/12/2023)  Transportation Needs: Patient Unable To Answer (03/12/2023)  Utilities: Patient Unable To Answer (03/12/2023)  Social Connections: Unknown (03/12/2023)  Tobacco Use: Medium Risk  (03/10/2023)     Readmission Risk Interventions     No data to display

## 2023-03-16 NOTE — Plan of Care (Signed)
  Problem: Education: Goal: Knowledge of General Education information will improve Description: Including pain rating scale, medication(s)/side effects and non-pharmacologic comfort measures Outcome: Progressing   Problem: Health Behavior/Discharge Planning: Goal: Ability to manage health-related needs will improve Outcome: Progressing   Problem: Clinical Measurements: Goal: Ability to maintain clinical measurements within normal limits will improve Outcome: Progressing Goal: Will remain free from infection Outcome: Progressing Goal: Diagnostic test results will improve Outcome: Progressing Goal: Respiratory complications will improve Outcome: Progressing Goal: Cardiovascular complication will be avoided Outcome: Progressing   Problem: Nutrition: Goal: Adequate nutrition will be maintained Outcome: Progressing   Problem: Coping: Goal: Level of anxiety will decrease Outcome: Progressing   Problem: Elimination: Goal: Will not experience complications related to bowel motility Outcome: Progressing Goal: Will not experience complications related to urinary retention Outcome: Progressing   Problem: Pain Managment: Goal: General experience of comfort will improve and/or be controlled Outcome: Progressing   Problem: Safety: Goal: Ability to remain free from injury will improve Outcome: Progressing   Problem: Skin Integrity: Goal: Risk for impaired skin integrity will decrease Outcome: Progressing   Problem: Education: Goal: Expressions of having a comfortable level of knowledge regarding the disease process will increase Outcome: Progressing   Problem: Coping: Goal: Ability to adjust to condition or change in health will improve Outcome: Progressing Goal: Ability to identify appropriate support needs will improve Outcome: Progressing   Problem: Health Behavior/Discharge Planning: Goal: Compliance with prescribed medication regimen will improve Outcome:  Progressing   Problem: Medication: Goal: Risk for medication side effects will decrease Outcome: Progressing   Problem: Clinical Measurements: Goal: Complications related to the disease process, condition or treatment will be avoided or minimized Outcome: Progressing Goal: Diagnostic test results will improve Outcome: Progressing   Problem: Safety: Goal: Verbalization of understanding the information provided will improve Outcome: Progressing   Problem: Self-Concept: Goal: Level of anxiety will decrease Outcome: Progressing Goal: Ability to verbalize feelings about condition will improve Outcome: Progressing

## 2023-03-16 NOTE — Plan of Care (Signed)
Problem: Education: Goal: Knowledge of General Education information will improve Description: Including pain rating scale, medication(s)/side effects and non-pharmacologic comfort measures 03/16/2023 1058 by Beryle Flock, RN Outcome: Adequate for Discharge 03/16/2023 1028 by Beryle Flock, RN Outcome: Progressing   Problem: Health Behavior/Discharge Planning: Goal: Ability to manage health-related needs will improve 03/16/2023 1058 by Beryle Flock, RN Outcome: Adequate for Discharge 03/16/2023 1028 by Beryle Flock, RN Outcome: Progressing   Problem: Clinical Measurements: Goal: Ability to maintain clinical measurements within normal limits will improve 03/16/2023 1058 by Beryle Flock, RN Outcome: Adequate for Discharge 03/16/2023 1028 by Beryle Flock, RN Outcome: Progressing Goal: Will remain free from infection 03/16/2023 1058 by Beryle Flock, RN Outcome: Adequate for Discharge 03/16/2023 1028 by Beryle Flock, RN Outcome: Progressing Goal: Diagnostic test results will improve 03/16/2023 1058 by Beryle Flock, RN Outcome: Adequate for Discharge 03/16/2023 1028 by Beryle Flock, RN Outcome: Progressing Goal: Respiratory complications will improve 03/16/2023 1058 by Beryle Flock, RN Outcome: Adequate for Discharge 03/16/2023 1028 by Beryle Flock, RN Outcome: Progressing Goal: Cardiovascular complication will be avoided 03/16/2023 1058 by Beryle Flock, RN Outcome: Adequate for Discharge 03/16/2023 1028 by Beryle Flock, RN Outcome: Progressing   Problem: Nutrition: Goal: Adequate nutrition will be maintained 03/16/2023 1058 by Beryle Flock, RN Outcome: Adequate for Discharge 03/16/2023 1028 by Beryle Flock, RN Outcome: Progressing   Problem: Coping: Goal: Level of anxiety will decrease 03/16/2023 1058 by Beryle Flock, RN Outcome: Adequate for Discharge 03/16/2023 1028 by Beryle Flock, RN Outcome: Progressing   Problem: Elimination: Goal: Will not experience  complications related to bowel motility 03/16/2023 1058 by Beryle Flock, RN Outcome: Adequate for Discharge 03/16/2023 1028 by Beryle Flock, RN Outcome: Progressing Goal: Will not experience complications related to urinary retention 03/16/2023 1058 by Beryle Flock, RN Outcome: Adequate for Discharge 03/16/2023 1028 by Beryle Flock, RN Outcome: Progressing   Problem: Pain Managment: Goal: General experience of comfort will improve and/or be controlled 03/16/2023 1058 by Beryle Flock, RN Outcome: Adequate for Discharge 03/16/2023 1028 by Beryle Flock, RN Outcome: Progressing   Problem: Safety: Goal: Ability to remain free from injury will improve 03/16/2023 1058 by Beryle Flock, RN Outcome: Adequate for Discharge 03/16/2023 1028 by Beryle Flock, RN Outcome: Progressing   Problem: Skin Integrity: Goal: Risk for impaired skin integrity will decrease 03/16/2023 1058 by Beryle Flock, RN Outcome: Adequate for Discharge 03/16/2023 1028 by Beryle Flock, RN Outcome: Progressing   Problem: Education: Goal: Expressions of having a comfortable level of knowledge regarding the disease process will increase 03/16/2023 1058 by Beryle Flock, RN Outcome: Adequate for Discharge 03/16/2023 1028 by Beryle Flock, RN Outcome: Progressing   Problem: Coping: Goal: Ability to adjust to condition or change in health will improve 03/16/2023 1058 by Beryle Flock, RN Outcome: Adequate for Discharge 03/16/2023 1028 by Beryle Flock, RN Outcome: Progressing Goal: Ability to identify appropriate support needs will improve 03/16/2023 1058 by Beryle Flock, RN Outcome: Adequate for Discharge 03/16/2023 1028 by Beryle Flock, RN Outcome: Progressing   Problem: Health Behavior/Discharge Planning: Goal: Compliance with prescribed medication regimen will improve 03/16/2023 1058 by Beryle Flock, RN Outcome: Adequate for Discharge 03/16/2023 1028 by Beryle Flock, RN Outcome: Progressing   Problem:  Medication: Goal: Risk for medication side effects will decrease 03/16/2023 1058 by Beryle Flock, RN Outcome: Adequate for Discharge 03/16/2023 1028 by Beryle Flock, RN Outcome: Progressing   Problem: Clinical Measurements: Goal: Complications related to the disease process, condition or treatment will be avoided or minimized 03/16/2023 1058 by Beryle Flock, RN Outcome: Adequate for  Discharge 03/16/2023 1028 by Beryle Flock, RN Outcome: Progressing Goal: Diagnostic test results will improve 03/16/2023 1058 by Beryle Flock, RN Outcome: Adequate for Discharge 03/16/2023 1028 by Beryle Flock, RN Outcome: Progressing   Problem: Safety: Goal: Verbalization of understanding the information provided will improve 03/16/2023 1058 by Beryle Flock, RN Outcome: Adequate for Discharge 03/16/2023 1028 by Beryle Flock, RN Outcome: Progressing   Problem: Self-Concept: Goal: Level of anxiety will decrease 03/16/2023 1058 by Beryle Flock, RN Outcome: Adequate for Discharge 03/16/2023 1028 by Beryle Flock, RN Outcome: Progressing Goal: Ability to verbalize feelings about condition will improve 03/16/2023 1058 by Beryle Flock, RN Outcome: Adequate for Discharge 03/16/2023 1028 by Beryle Flock, RN Outcome: Progressing

## 2023-03-17 LAB — CULTURE, BLOOD (ROUTINE X 2)
Culture: NO GROWTH
Culture: NO GROWTH
Special Requests: ADEQUATE
Special Requests: ADEQUATE

## 2023-05-17 ENCOUNTER — Encounter: Payer: Self-pay | Admitting: Cardiology

## 2023-05-17 ENCOUNTER — Ambulatory Visit: Attending: Cardiology | Admitting: Cardiology

## 2023-05-17 VITALS — BP 137/69 | HR 67 | Ht 71.0 in | Wt 170.0 lb

## 2023-05-17 DIAGNOSIS — I480 Paroxysmal atrial fibrillation: Secondary | ICD-10-CM

## 2023-05-17 DIAGNOSIS — I10 Essential (primary) hypertension: Secondary | ICD-10-CM

## 2023-05-17 DIAGNOSIS — I5032 Chronic diastolic (congestive) heart failure: Secondary | ICD-10-CM | POA: Diagnosis not present

## 2023-05-17 DIAGNOSIS — F1010 Alcohol abuse, uncomplicated: Secondary | ICD-10-CM

## 2023-05-17 DIAGNOSIS — R569 Unspecified convulsions: Secondary | ICD-10-CM

## 2023-05-17 DIAGNOSIS — E782 Mixed hyperlipidemia: Secondary | ICD-10-CM

## 2023-05-17 MED ORDER — RIVAROXABAN 20 MG PO TABS: 20.0000 mg | ORAL_TABLET | Freq: Every day | ORAL | 3 refills | Status: DC

## 2023-05-17 NOTE — Progress Notes (Signed)
 Cardiology Office Note:  .   Date:  05/17/2023  ID:  John Spears, DOB [birth year], MRN [id number] PCP: Patient, No Pcp Per  [facility] Providers Cardiologist:  Yvonne Kyston, MD    History of Present Illness: John Spears is a 73 y.o. male with past medical history of cardiomyopathy, chronic HFimpEF, hypertension, seizure disorder, alcohol abuse, mixed hyperlipidemia, paroxysmal atrial fibrillation, he was here today for follow-up after recent hospitalization.   Was admitted to [facility] in December 2023 after being found unresponsive by a friend.  He was initially intubated requiring vasopressor support with concern he may have been seizures prior to arrival.  Stroke workup was unremarkable.  Echocardiogram revealed an LVEF of 35-40%, moderate asymmetric hypertrophy of the basal septum and mid/apical, anterior/anterolateral hypokinesis.  PA pressures were moderately elevated and no significant valvular disease was noted.  High-sensitivity troponin peaked at 291.  He was followed by cardiology and was started on GDMT including beta-blocker, Entresto, spironolactone, daily Lasix.  He was not felt to be a good candidate for diagnostic catheterization due to ongoing encephalopathy.  He had been treated for aspiration pneumonia and was subsequently discharged to follow-up in clinic.  Seen in clinic 03/2022, accompanied by family member, during this visit he was with his only complaint being persistent chest congestion and nonproductive cough as well as nasal congestion. He was scheduled for blood work and a limited prescription. He was also sent for chest x-ray and encouraged to use nasal spray and over-the-counter guaifenesin.  He was then seen in clinic again in April 2024 stated he had been taking his medications as prescribed.  His sister had an shortage of medication making him feel impact so he was unaware of his current doses.  He denied any chest pain or shortness of breath.  He  was continued to work on escalating GDMT although he was not a candidate for SGLT2 inhibitor due to obesity, sedentary state, history of noncompliance.  It was discussed that he continued to the need for an ischemic evaluation and did not call us to only have an updated echocardiogram completed at that time.  He continued to drink daily and was encouraged to abstain. he was seen in clinic following, 07/2022 where he reported he had been doing well.  After having repeat echocardiogram done and received a notification that his echo was normal he was placed back on his Entresto, spironolactone, and potassium.  He also continued to decline to pursue an ischemic evaluation.  He was admitted at [facility] 09/25/2022 discharge 09/26/2022 with primary complaint of chest pain.  High-sensitivity troponins were in the 40s.  EKG revealed sinus rhythm with fascicular block.  He underwent stress testing for continued ischemic evaluation that showed no significant ischemia was considered a low risk scan.  He was considered stable and was able to be discharged home.  Unfortunately he was again admitted to the hospital 02/05/2023 for acute metabolic encephalopathy.  Patient presented to the [facility] emergency department after patient's sister found him lying on the floor, 911 she was concern of seizure activity.  She had noted he had been compliant with his medications but was unclear about his alcohol use.  EMS noted seizure activity, right tremor, leftward gaze.  He was given 5 mg of Versed which did not break his seizure activity and subsequent 2.5 mg with abatement.  He was intubated immediately upon arrival and encouraged for code was called and he was taken to CT with no evidence  of bleed.  He was loaded with Keppra and started on propofol/fentanyl and admitted under the service of PCCM.  He was followed by neurology in the hospital and underwent an EEG.  Keppra dosing was uptitrated to 1500 mg twice daily.  He was placed on Eliquis  secondary to stroke prophylaxis for paroxysmal atrial fibrillation with a CHA2DS2-VASc score of 5 and aspirin was discontinued to decrease the risk of bleeding with his Eliquis.  He was considered stable and discharged to skilled nursing facility on 02/10/2023.  He was again readmitted to [facility] from 1/14 - 03/16/2023 for acute metabolic encephalopathy secondary to seizures.  He had breakthrough seizures, and went up having his Keppra increased to 1500 mg twice daily and started on Vimpat 150 mg twice daily.  He remained uneventful for 48 hours.  He was to be continued on seizure precautions and rehab was recommended.  On 1/17 he was preparing for discharge to SNF however revealed 3 short lasting generalized tonic-clonic seizures witnessed in the hospital.  He also was found to have a low-grade fever and to have a rhinovirus infection.  On 1/18 he had another episode of seizure-like activity after walking back from the bathroom.  Multiple adjustments were done to his seizure medications and he had remained stable was able to transfer to SNF on 03/16/2023.   Returns to clinic today accompanied by his sister.  He states that he has been doing well from a cardiac perspective without chest pain, shortness of breath, peripheral edema or palpitations.  Sister also states that since his discharge from the hospital he has not had any recurrent seizure activity with adjustments in his medication.  She did note that they have followed up with his PCP and she was concerned about him taking apixaban and aspirin.  He had also noted a rash breaking out on his face since starting apixaban.  After following up with PCP as advised only take apixaban once daily versus twice daily and the rash is treated with Neosporin and slightly improved.  He denies any bleeding and has not noted any blood in his stool or urine since being on oral anticoagulant therapy.  ROS: 10 point review of systems has been reviewed and considered negative with  exception was been listed in the HPI  Studies Reviewed: Marland Kitchen      Lexiscan Myoview 09/25/2022  Pharmacological myocardial perfusion imaging study with no significant  ischemia Normal wall motion, EF estimated at 67% No EKG changes concerning for ischemia at peak stress or in recovery. CT attenuation correction images with no significant coronary calcification, minimal aortic atherosclerosis Low risk scan  2D echo 06/24/2022 1. Left ventricular ejection fraction, by estimation, is 60 to 65%. The  left ventricle has normal function. There is mild left ventricular  hypertrophy. Left ventricular diastolic parameters are consistent with  Grade I diastolic dysfunction (impaired  relaxation).   2. Right ventricular systolic function is normal.   3. The mitral valve is normal in structure. Trivial mitral valve  regurgitation.   Comparison(s): Previous LVEF reported as 30-35%.   TTE 02/24/22 1. Left ventricular ejection fraction, by estimation, is 30 to 35%. The  left ventricle has moderately decreased function. The left ventricle  demonstrates regional wall motion abnormalities (see scoring  diagram/findings for description). There is  moderate asymmetric left ventricular hypertrophy of the basal-septal  segment. Left ventricular diastolic parameters are indeterminate.   2. Right ventricular systolic function is normal. The right ventricular  size is normal. There is  moderately elevated pulmonary artery systolic  pressure. The estimated right ventricular systolic pressure is 52.1 mmHg.   3. The mitral valve is normal in structure. Trivial mitral valve  regurgitation.   4. The aortic valve is tricuspid. Aortic valve regurgitation is not  visualized. No aortic stenosis is present.   5. The inferior vena cava is normal in size with <50% respiratory  variability, suggesting right atrial pressure of 8 mmHg.    Risk Assessment/Calculations:    CHA2DS2-VASc Score = 5   This indicates a 7.2% annual  risk of stroke. The patient's score is based upon: CHF History: 1 HTN History: 1 Diabetes History: 0 Stroke History: 2 Vascular Disease History: 0 Age Score: 1 Gender Score: 0            Physical Exam:   VS:  BP 137/69 (BP Location: Right Arm)   Pulse 67   Ht 5\' 11"  (1.803 m)   Wt 170 lb (77.1 kg)   SpO2 98%   BMI 23.71 kg/m    Wt Readings from Last 3 Encounters:  05/17/23 170 lb (77.1 kg)  03/09/23 175 lb 0.7 oz (79.4 kg)  02/09/23 189 lb 9.5 oz (86 kg)    GEN: Well nourished, well developed in no acute distress NECK: No JVD; No carotid bruits CARDIAC: RRR, no murmurs, rubs, gallops RESPIRATORY:  Clear to auscultation without rales, wheezing or rhonchi  ABDOMEN: Soft, non-tender, non-distended EXTREMITIES:  No edema; No deformity   ASSESSMENT AND PLAN: .   Paroxysmal atrial fibrillation rate is maintaining sinus rhythm on exam today.  Previously was placed on apixaban 5 mg twice daily for CHA2DS2-VASc score of at least 5 for stroke prophylaxis.  Due to a rash on his face and pustules his apixaban was decreased from twice daily to daily on the recommendation of his PCP per his sister.  Also on discharge summary from prior hospitalization and aspirin was to be discontinued to decrease the risk of further bleeding along with his apixaban.  When potential allergic reaction to apixaban this is being discontinued today and he has been started on Xarelto 20 mg daily.  HFimpEF with his last limited echocardiogram completed in 06/2022 revealed an LVEF improved to 60 to 65%, mild left ventricular hypertrophy, G1 DD, normal right ventricular function with trivial MR.  Previously LVEF was reported in 30-35% on prior studies.  He appears to be euvolemic on exam today.  His sister is noted that he often has swelling to his bilateral lower extremities.  He is continued on carvedilol 25 mg twice daily, furosemide 20 mg daily.  Previously been taking Entresto, spironolactone, and potassium.  These  were all discontinued due to elevated serum creatinine.  Essential hypertension the blood pressure today 137/69.  Blood pressure remained stable.  He is continued on amlodipine 10 mg daily and carvedilol 25 mg twice daily.  They are encouraged to continue to monitor his pressure 1 to 2 hours postmedication administration as well.  Mixed hyperlipidemia where he is continued on atorvastatin 40 mg daily.  Seizure disorder that has been quiescent as he is currently on Keppra and Lacosamide.  Continues to be followed by neurology.  EtOH abuse with total cessation recommended       Dispo: Return to clinic to see MD/APP in 6 weeks or sooner to ensure that he tolerates medication changes made today.  Signed, Freddy Kinne, NP

## 2023-05-17 NOTE — Patient Instructions (Signed)
 Medication Instructions:  Your physician recommends the following medication changes.  STOP TAKING: Asprin 81 mg , Eliquis 5 mg  START TAKING: Xarelto 20 mg daily  *If you need a refill on your cardiac medications before your next appointment, please call your pharmacy*   Lab Work: No labs ordered today  If you have labs (blood work) drawn today and your tests are completely normal, you will receive your results only by: MyChart Message (if you have MyChart) OR A paper copy in the mail If you have any lab test that is abnormal or we need to change your treatment, we will call you to review the results.   Testing/Procedures: No test ordered today    Follow-Up: At [facility], you and your health needs are our priority.  As part of our continuing mission to provide you with exceptional heart care, we have created designated Provider Care Teams.  These Care Teams include your primary Cardiologist (physician) and Advanced Practice Providers (APPs -  Physician Assistants and Nurse Practitioners) who all work together to provide you with the care you need, when you need it.  We recommend signing up for the patient portal called "MyChart".  Sign up information is provided on this After Visit Summary.  MyChart is used to connect with patients for Virtual Visits (Telemedicine).  Patients are able to view lab/test results, encounter notes, upcoming appointments, etc.  Non-urgent messages can be sent to your provider as well.   To learn more about what you can do with MyChart, go to [email or web address].    Your next appointment:   6 week(s)  Provider:   You may see Yvonne Marcellous, MD or one of the following Advanced Practice Providers on your designated Care Team:   Nicolasa Ducking, NP Eula Listen, PA-C Cadence Fransico Michael, PA-C Charlsie Quest, NP Carlos Levering, NP

## 2023-05-17 NOTE — Progress Notes (Deleted)
 Cardiology Office Note:  .   Date:  05/17/2023  ID:  John Spears, DOB 25-Jan-1951, MRN [id number] PCP: Patient, No Pcp Per  [facility] Providers Cardiologist:  Yvonne Heinz, MD { Click to update primary MD,subspecialty MD or APP then REFRESH:1}   History of Present Illness: John Spears is a 73 y.o. male with past medical history of cardiomyopathy, chronic HFimpEF, hypertension, seizure disorder, alcohol abuse, mixed hyperlipidemia, he was here today for follow-up after recent hospitalization.   Was admitted to [facility] in December 2023 after being found unresponsive by a friend.  He was initially intubated requiring vasopressor support with concern he may have been seizures prior to arrival.  Stroke workup was unremarkable.  Echocardiogram revealed an LVEF of 35-40%, moderate asymmetric hypertrophy of the basal septum and mid/apical, anterior/anterolateral hypokinesis.  PA pressures were moderately elevated and no significant valvular disease was noted.  High-sensitivity troponin peaked at 291.  He was followed by cardiology and was started on GDMT including beta-blocker, Entresto, spironolactone, daily Lasix.  He was not felt to be a good candidate for diagnostic catheterization due to ongoing encephalopathy.  He had been treated for aspiration pneumonia and was subsequently discharged to follow-up in clinic.  Seen in clinic 03/2022, accompanied by family member, during this visit he was with his only complaint being persistent chest congestion and nonproductive cough as well as nasal congestion. He was scheduled for blood work and a limited prescription. He was also sent for chest x-ray and encouraged to use nasal spray and over-the-counter guaifenesin.  He was then seen in clinic again in April 2024 stated he had been taking his medications as prescribed.  His sister had an shortage of medication making him feel impact so he was unaware of his current doses.  He denied any chest  pain or shortness of breath.  He was continued to work on escalating GDMT although he was not a candidate for SGLT2 inhibitor due to obesity, sedentary state, history of noncompliance.  It was discussed that he continued to the need for an ischemic evaluation and did not call us to only have an updated echocardiogram completed at that time.  He continued to drink daily and was encouraged to abstain. he was seen in clinic following, 07/2022 where he reported he had been doing well.  After having repeat echocardiogram done and received a notification that his echo was normal he was placed back on his Entresto, spironolactone, and potassium.  He also continued to decline to pursue an ischemic evaluation.  He was admitted at [facility] 09/25/2022 discharge 09/26/2022 with primary complaint of chest pain.  High-sensitivity troponins were in the 40s.  EKG revealed sinus rhythm with fascicular block.  He underwent stress testing for continued ischemic evaluation that showed no significant ischemia was considered a low risk scan.  He was considered stable and was able to be discharged home.  Unfortunately he was again admitted to the hospital 02/05/2023 for acute metabolic encephalopathy.  Patient presented to the [facility] emergency department after patient's sister found him lying on the floor, 911 she was concern of seizure activity.  She had noted he had been compliant with his medications but was unclear about his alcohol use.  EMS noted seizure activity, right tremor, leftward gaze.  He was given 5 mg of Versed which did not break his seizure activity and subsequent 2.5 mg with abatement.  He was intubated immediately upon arrival and encouraged for code was called and he  was taken to CT with no evidence of bleed.  He was loaded with Keppra and started on propofol/fentanyl and admitted under the service of PCCM.  He was followed by neurology in the hospital and underwent an EEG.  Keppra dosing was uptitrated to 1500 mg twice daily.   He was placed on Eliquis secondary to stroke prophylaxis for paroxysmal atrial fibrillation with a CHA2DS2-VASc score of 5 and aspirin was discontinued to decrease the risk of bleeding with his Eliquis.  He was considered stable and discharged to skilled nursing facility on 02/10/2023.  He was again readmitted to [facility] from 1/14 - 03/16/2023 for acute metabolic encephalopathy secondary to seizures.  He had breakthrough seizures, and went up having his Keppra increased to 1500 mg twice daily and started on Vimpat 150 mg twice daily.  He remained uneventful for 48 hours.  He was to be continued on seizure precautions and rehab was recommended.  On 1/17 he was preparing for discharge to SNF however revealed 3 short lasting generalized tonic-clonic seizures witnessed in the hospital.  He also was found to have a low-grade fever and to have a rhinovirus infection.  On 1/18 he had another episode of seizure-like activity after walking back from the bathroom.  Multiple adjustments were done to his seizure medications and he had remained stable was able to transfer to SNF on 03/16/2023.   Returns to clinic today accompanied by his sister.  He states that he has been doing well from a cardiac perspective without chest pain, shortness of breath, peripheral edema or palpitations.  Sister also states that since his discharge from the hospital he has not had any recurrent seizure activity with adjustments in his medication.  She did note that they have followed up with his PCP and she was concerned about him taking apixaban and aspirin.  He had also noted a rash breaking out on his face since starting apixaban.  After following up with PCP as advised only take apixaban once daily versus twice daily and the rash is treated with Neosporin and slightly improved.  He denies any bleeding and has not noted any blood in his stool or urine since being on oral anticoagulant therapy.  ROS: 10 point review of systems has been reviewed and  considered negative with exception was been listed in the HPI  Studies Reviewed: Marland Kitchen      Lexiscan Myoview 09/25/2022  Pharmacological myocardial perfusion imaging study with no significant  ischemia Normal wall motion, EF estimated at 67% No EKG changes concerning for ischemia at peak stress or in recovery. CT attenuation correction images with no significant coronary calcification, minimal aortic atherosclerosis Low risk scan  2D echo 06/24/2022 1. Left ventricular ejection fraction, by estimation, is 60 to 65%. The  left ventricle has normal function. There is mild left ventricular  hypertrophy. Left ventricular diastolic parameters are consistent with  Grade I diastolic dysfunction (impaired  relaxation).   2. Right ventricular systolic function is normal.   3. The mitral valve is normal in structure. Trivial mitral valve  regurgitation.   Comparison(s): Previous LVEF reported as 30-35%.   TTE 02/24/22 1. Left ventricular ejection fraction, by estimation, is 30 to 35%. The  left ventricle has moderately decreased function. The left ventricle  demonstrates regional wall motion abnormalities (see scoring  diagram/findings for description). There is  moderate asymmetric left ventricular hypertrophy of the basal-septal  segment. Left ventricular diastolic parameters are indeterminate.   2. Right ventricular systolic function is normal. The right  ventricular  size is normal. There is moderately elevated pulmonary artery systolic  pressure. The estimated right ventricular systolic pressure is 52.1 mmHg.   3. The mitral valve is normal in structure. Trivial mitral valve  regurgitation.   4. The aortic valve is tricuspid. Aortic valve regurgitation is not  visualized. No aortic stenosis is present.   5. The inferior vena cava is normal in size with <50% respiratory  variability, suggesting right atrial pressure of 8 mmHg.    Risk Assessment/Calculations:    CHA2DS2-VASc Score = 5   {Confirm score is correct.  If not, click here to update score.  REFRESH note.  :1} This indicates a 7.2% annual risk of stroke. The patient's score is based upon: CHF History: 1 HTN History: 1 Diabetes History: 0 Stroke History: 2 Vascular Disease History: 0 Age Score: 1 Gender Score: 0   {This patient has a significant risk of stroke if diagnosed with atrial fibrillation.  Please consider VKA or DOAC agent for anticoagulation if the bleeding risk is acceptable.   You can also use the SmartPhrase .[email or web address] for documentation.   :[id number]} No BP recorded.  {Refresh Note OR Click here to enter BP  :1}***       Physical Exam:   VS:  There were no vitals taken for this visit.   Wt Readings from Last 3 Encounters:  03/09/23 175 lb 0.7 oz (79.4 kg)  02/09/23 189 lb 9.5 oz (86 kg)  09/25/22 172 lb 6.4 oz (78.2 kg)    GEN: Well nourished, well developed in no acute distress NECK: No JVD; No carotid bruits CARDIAC: ***RRR, no murmurs, rubs, gallops RESPIRATORY:  Clear to auscultation without rales, wheezing or rhonchi  ABDOMEN: Soft, non-tender, non-distended EXTREMITIES:  No edema; No deformity   ASSESSMENT AND PLAN: .   ***    {Are you ordering a CV Procedure (e.g. stress test, cath, DCCV, TEE, etc)?   Press F2        :[id number]}  Dispo: ***  Signed, Corley Maffeo, NP

## 2023-06-04 ENCOUNTER — Other Ambulatory Visit: Payer: Self-pay

## 2023-06-04 ENCOUNTER — Emergency Department
Admission: EM | Admit: 2023-06-04 | Discharge: 2023-06-05 | Disposition: A | Discharge status: Home / Self Care | Attending: Emergency Medicine | Admitting: Emergency Medicine

## 2023-06-04 DIAGNOSIS — R404 Transient alteration of awareness: Secondary | ICD-10-CM | POA: Insufficient documentation

## 2023-06-04 DIAGNOSIS — R410 Disorientation, unspecified: Secondary | ICD-10-CM | POA: Diagnosis present

## 2023-06-04 DIAGNOSIS — Y901 Blood alcohol level of 20-39 mg/100 ml: Secondary | ICD-10-CM | POA: Diagnosis not present

## 2023-06-04 DIAGNOSIS — K869 Disease of pancreas, unspecified: Secondary | ICD-10-CM | POA: Insufficient documentation

## 2023-06-04 DIAGNOSIS — R569 Unspecified convulsions: Secondary | ICD-10-CM | POA: Diagnosis not present

## 2023-06-04 DIAGNOSIS — K8689 Other specified diseases of pancreas: Secondary | ICD-10-CM

## 2023-06-04 NOTE — ED Triage Notes (Signed)
 Patient arrives via EMS C/O impending seizure. Patient told this to his sister and stated that he just didn't feel good, but states that he does not remember saying this. Patient is alert and oriented x3, not to year, which is his baseline per sister. Patient takes Keppra, but did not take it today because he had been drinking alcohol, which he does daily.

## 2023-06-05 ENCOUNTER — Emergency Department

## 2023-06-05 DIAGNOSIS — R404 Transient alteration of awareness: Secondary | ICD-10-CM | POA: Diagnosis not present

## 2023-06-05 LAB — CBC WITH DIFFERENTIAL/PLATELET
Abs Immature Granulocytes: 0.01 10*3/uL (ref 0.00–0.07)
Basophils Absolute: 0.1 10*3/uL (ref 0.0–0.1)
Basophils Relative: 1 %
Eosinophils Absolute: 0.3 10*3/uL (ref 0.0–0.5)
Eosinophils Relative: 5 %
HCT: 31.3 % — ABNORMAL LOW (ref 39.0–52.0)
Hemoglobin: 10.5 g/dL — ABNORMAL LOW (ref 13.0–17.0)
Immature Granulocytes: 0 %
Lymphocytes Relative: 47 %
Lymphs Abs: 2.6 10*3/uL (ref 0.7–4.0)
MCH: 28.9 pg (ref 26.0–34.0)
MCHC: 33.5 g/dL (ref 30.0–36.0)
MCV: 86.2 fL (ref 80.0–100.0)
Monocytes Absolute: 0.7 10*3/uL (ref 0.1–1.0)
Monocytes Relative: 13 %
Neutro Abs: 1.8 10*3/uL (ref 1.7–7.7)
Neutrophils Relative %: 34 %
Platelets: 183 10*3/uL (ref 150–400)
RBC: 3.63 MIL/uL — ABNORMAL LOW (ref 4.22–5.81)
RDW: 15.9 % — ABNORMAL HIGH (ref 11.5–15.5)
WBC: 5.5 10*3/uL (ref 4.0–10.5)
nRBC: 0 % (ref 0.0–0.2)

## 2023-06-05 LAB — URINALYSIS, ROUTINE W REFLEX MICROSCOPIC
Bilirubin Urine: NEGATIVE
Glucose, UA: NEGATIVE mg/dL
Hgb urine dipstick: NEGATIVE
Ketones, ur: NEGATIVE mg/dL
Leukocytes,Ua: NEGATIVE
Nitrite: NEGATIVE
Protein, ur: NEGATIVE mg/dL
Specific Gravity, Urine: 1.01 (ref 1.005–1.030)
pH: 5 (ref 5.0–8.0)

## 2023-06-05 LAB — COMPREHENSIVE METABOLIC PANEL WITH GFR
ALT: 12 U/L (ref 0–44)
AST: 23 U/L (ref 15–41)
Albumin: 3.7 g/dL (ref 3.5–5.0)
Alkaline Phosphatase: 81 U/L (ref 38–126)
Anion gap: 10 (ref 5–15)
BUN: 21 mg/dL (ref 8–23)
CO2: 21 mmol/L — ABNORMAL LOW (ref 22–32)
Calcium: 9.2 mg/dL (ref 8.9–10.3)
Chloride: 110 mmol/L (ref 98–111)
Creatinine, Ser: 0.97 mg/dL (ref 0.61–1.24)
GFR, Estimated: >60 mL/min (ref >60)
Glucose, Bld: 73 mg/dL (ref 70–99)
Potassium: 3.7 mmol/L (ref 3.5–5.1)
Sodium: 141 mmol/L (ref 135–145)
Total Bilirubin: 0.5 mg/dL (ref 0.0–1.2)
Total Protein: 7 g/dL (ref 6.5–8.1)

## 2023-06-05 LAB — URINE DRUG SCREEN, QUALITATIVE (ARMC ONLY)
Amphetamines, Ur Screen: NOT DETECTED
Barbiturates, Ur Screen: NOT DETECTED
Benzodiazepine, Ur Scrn: NOT DETECTED
Cannabinoid 50 Ng, Ur ~~LOC~~: NOT DETECTED
Cocaine Metabolite,Ur ~~LOC~~: NOT DETECTED
MDMA (Ecstasy)Ur Screen: NOT DETECTED
Methadone Scn, Ur: NOT DETECTED
Opiate, Ur Screen: NOT DETECTED
Phencyclidine (PCP) Ur S: NOT DETECTED
Tricyclic, Ur Screen: NOT DETECTED

## 2023-06-05 LAB — ETHANOL: Alcohol, Ethyl (B): 38 mg/dL — ABNORMAL HIGH (ref <10)

## 2023-06-05 LAB — CBG MONITORING, ED
Glucose-Capillary: 60 mg/dL — ABNORMAL LOW (ref 70–99)
Glucose-Capillary: 101 mg/dL — ABNORMAL HIGH (ref 70–99)

## 2023-06-05 LAB — MAGNESIUM: Magnesium: 1.7 mg/dL (ref 1.7–2.4)

## 2023-06-05 MED ORDER — THIAMINE HCL 100 MG/ML IJ SOLN
100.0000 mg | Freq: Once | INTRAMUSCULAR | Status: AC
  Administered 2023-06-05: 100 mg via INTRAVENOUS
  Filled 2023-06-05: qty 2

## 2023-06-05 MED ORDER — LEVETIRACETAM IN NACL 1000 MG/100ML IV SOLN
1000.0000 mg | INTRAVENOUS | Status: AC
  Administered 2023-06-05: 1000 mg via INTRAVENOUS
  Filled 2023-06-05: qty 100

## 2023-06-05 MED ORDER — IOHEXOL 300 MG/ML  SOLN
100.0000 mL | Freq: Once | INTRAMUSCULAR | Status: AC | PRN
  Administered 2023-06-05: 100 mL via INTRAVENOUS

## 2023-06-05 NOTE — Discharge Instructions (Addendum)
 Your workup in the Emergency Department today was reassuring.  We did not find any specific abnormalities.  We recommend you drink plenty of fluids, take your regular medications and/or any new ones prescribed today, and follow up with the doctor(s) listed in these documents as recommended.  Most importantly you should follow-up with your primary care doctor at the next available opportunity, and you should also call the office of Dr. Baldomero Bone to schedule a follow-up appointment with her or one of her GI colleagues.  As we discussed, you have a mass on your pancreas that needs to be evaluated further in the outpatient setting.  If you do not currently have a primary care doctor, please call one of the clinics at the numbers listed in this packet of information and they will help get you set up with an appointment  Return to the Emergency Department if you develop new or worsening symptoms that concern you.

## 2023-06-05 NOTE — ED Provider Notes (Signed)
 [facility] Provider Note    Event Date/Time   First MD Initiated Contact with Patient 06/05/23 0001     (approximate)   History   Seizures  Level 5 caveat: Patient is unable to give a thorough history.  HPI John Spears is a 73 y.o. male who reportedly has a history of alcohol abuse and seizures who is supposed to take Keppra 750 mg twice daily.  He presents by EMS after a possible seizure.  History is limited.  Reportedly patient told his sister that he does not feel good but does not remember telling her that.  It is unclear if anyone witnessed any seizure-like activity.  Patient is awake and alert now but seems somewhat confused.  He said that he drinks multiple 40 ounce bottles of malt liquor daily.  He did not take his evening Keppra but he took his dose in the morning.  He denies any pain, shortness of breath, and fever.     Physical Exam   Triage Vital Signs: ED Triage Vitals  Encounter Vitals Group     BP 06/04/23 2347 (!) 178/82     Systolic BP Percentile --      Diastolic BP Percentile --      Pulse Rate 06/04/23 2347 65     Resp 06/04/23 2347 18     Temp 06/04/23 2347 98.4 F (36.9 C)     Temp Source 06/04/23 2347 Oral     SpO2 06/04/23 2347 99 %     Weight 06/04/23 2356 71.4 kg (157 lb 6.5 oz)     Height 06/04/23 2355 1.803 m (5\' 11" )     Head Circumference --      Peak Flow --      Pain Score 06/04/23 2347 0     Pain Loc --      Pain Education --      Exclude from Growth Chart --     Most recent vital signs: Vitals:   06/05/23 0500 06/05/23 0600  BP: 137/64 (!) 168/97  Pulse: (!) 49 74  Resp:    Temp:  98.5 F (36.9 C)  SpO2: 98% 100%    General: Awake, alert, responding appropriately but somewhat confused, unclear if this is his baseline. CV:  Good peripheral perfusion.  Regular rate and rhythm. Resp:  Normal effort. Speaking easily and comfortably, no accessory muscle usage nor intercostal retractions.  Lungs  clear to auscultation bilaterally. Abd:  No distention.  No tenderness to palpation of the abdomen. Other:  No gross neurological deficits appreciated.  No tremor, no asterixis.  Patient is slurring his speech but as I talk with him, I get the impression that his speech is likely at baseline.   ED Results / Procedures / Treatments   Labs (all labs ordered are listed, but only abnormal results are displayed) Labs Reviewed  URINALYSIS, ROUTINE W REFLEX MICROSCOPIC - Abnormal; Notable for the following components:      Result Value   Color, Urine STRAW (*)    APPearance CLEAR (*)    All other components within normal limits  ETHANOL - Abnormal; Notable for the following components:   Alcohol, Ethyl (B) 38 (*)    All other components within normal limits  COMPREHENSIVE METABOLIC PANEL WITH GFR - Abnormal; Notable for the following components:   CO2 21 (*)    All other components within normal limits  CBC WITH DIFFERENTIAL/PLATELET - Abnormal; Notable for the following components:   RBC  3.63 (*)    Hemoglobin 10.5 (*)    HCT 31.3 (*)    RDW 15.9 (*)    All other components within normal limits  CBG MONITORING, ED - Abnormal; Notable for the following components:   Glucose-Capillary 60 (*)    All other components within normal limits  CBG MONITORING, ED - Abnormal; Notable for the following components:   Glucose-Capillary 101 (*)    All other components within normal limits  URINE DRUG SCREEN, QUALITATIVE ([facility] ONLY)  MAGNESIUM     EKG  ED ECG REPORT I, Lynnda Sas, the attending physician, personally viewed and interpreted this ECG.  Date: 06/04/2023 EKG Time: 23: 45 Rate: 63 Rhythm: sinus rhythm with first-degree AV block QRS Axis: normal Intervals: PR interval 282 ms, right bundle branch block and left anterior fascicular block, otherwise unremarkable ST/T Wave abnormalities: Non-specific ST segment / T-wave changes, but no clear evidence of acute ischemia. Narrative  Interpretation: no definitive evidence of acute ischemia; does not meet STEMI criteria.    RADIOLOGY See ED course for details regarding head CT   PROCEDURES:  Critical Care performed: No  .1-3 Lead EKG Interpretation  Performed by: Lynnda Sas, MD Authorized by: Lynnda Sas, MD     Interpretation: normal     ECG rate:  65   ECG rate assessment: normal     Rhythm: sinus rhythm     Ectopy: none     Conduction: normal       IMPRESSION / MDM / ASSESSMENT AND PLAN / ED COURSE  I reviewed the triage vital signs and the nursing notes.                              Differential diagnosis includes, but is not limited to, alcohol intoxication versus withdrawal seizure, nonadherence to antiepileptic medication regimen, electrolyte or metabolic abnormality, intracranial neoplasm, acute intracranial bleed, CVA, other medication or drug side effect.  Patient's presentation is most consistent with acute presentation with potential threat to life or bodily function.  Labs/studies ordered: CMP, CBC with differential, magnesium, urinalysis, CBG, urine drug screen, ethanol level, CT head, EKG  Interventions/Medications given:  Medications  levETIRAcetam (KEPPRA) IVPB 1000 mg/100 mL premix (0 mg Intravenous Stopped 06/05/23 0127)  thiamine (VITAMIN B1) injection 100 mg (100 mg Intravenous Given 06/05/23 0239)  iohexol (OMNIPAQUE) 300 MG/ML solution 100 mL (100 mLs Intravenous Contrast Given 06/05/23 0411)    (Note:  hospital course my include additional interventions and/or labs/studies not listed above.)   Patient does not require emergent intervention at this moment.  Since he may or may not have had an additional seizure and he missed his dose of Keppra tonight, I ordered 1000 mg of IV Keppra.  CT head pending.  The patient is on the cardiac monitor to evaluate for evidence of arrhythmia and/or significant heart rate changes.  Labs notable for very minimal ethanol elevation of 38.   Otherwise CMP and CBC and magnesium levels are essentially normal.  Urinalysis and urine drug screen are pending.  I ordered CIWA protocol and ordered thiamine 1000 mg IV.  Of note, CBG was in the 60s, but he seemed asymptomatic.  We offered him food but he said he is not hungry and he declined.  I will not treat unless he becomes symptomatic  Clinical Course as of 06/05/23 1610  Sat Jun 05, 2023  0338 I reassessed the patient.  Now he states that he is  having left-sided abdominal pain.  He denies chest pain or shortness of breath.  I palpated his abdomen again and this time he is guarding on the left reporting tenderness.  I will evaluate with a CT of the abdomen and pelvis.  Thus far there is no evidence of an emergent medical condition but I will assess further anticipating that if all the results are reassuring he may be appropriate for discharge home. [CF]  0526 CT ABDOMEN PELVIS W CONTRAST I viewed and interpreted the patient's CT of the abdomen and pelvis.  I see no evidence of bowel obstruction or ureteral stone.  The radiologist agreed no acute findings but said that there is a pancreatic mass that needs to be followed up with as an outpatient with GI.  I will update the patient. [CF]  0542 CT Head Wo Contrast Of note, I also reviewed and interpreted the patient's CT head and I see no acute bleed or mass.  I can see evidence of prior stroke.  Radiology confirmed no acute changes, just chronic. [CF]  0542 I updated the patient about his results including the pancreatic mass that needs outpatient follow-up.  Patient indicated that he understood and will do so.  I considered hospitalization given his chronic medical issues, but at this point I see no evidence of an acute or emergent medical condition that requires inpatient treatment. [CF]    Clinical Course User Index [CF] Lynnda Sas, MD     FINAL CLINICAL IMPRESSION(S) / ED DIAGNOSES   Final diagnoses:  Transient alteration of  awareness  Pancreatic mass     Rx / DC Orders   ED Discharge Orders          Ordered    Ambulatory Referral to Primary Care (Establish Care)        06/05/23 0543             Note:  This document was prepared using Dragon voice recognition software and may include unintentional dictation errors.   Lynnda Sas, MD 06/05/23 [phone]

## 2023-06-05 NOTE — ED Notes (Signed)
 This NT assisted changing patient with nurse Debria Fang. Patient was wet. Patient had a complete bed changing. Patient is resting comfortably at this time.

## 2023-06-05 NOTE — ED Notes (Signed)
  Patient CBG 60. Patient asymptomatic at this time.  Dr Lajuana Pilar notified and instructed to give patient something to eat.  Patient given coke and applesauce due to not having his teeth.

## 2023-06-05 NOTE — ED Notes (Signed)
 Pt assisted using his urinal without incident.

## 2023-06-05 NOTE — ED Notes (Signed)
Repeat CBG 101

## 2023-06-12 ENCOUNTER — Emergency Department
Admission: EM | Admit: 2023-06-12 | Discharge: 2023-06-12 | Disposition: A | Discharge status: Home / Self Care | Attending: Emergency Medicine | Admitting: Emergency Medicine

## 2023-06-12 ENCOUNTER — Emergency Department

## 2023-06-12 ENCOUNTER — Other Ambulatory Visit: Payer: Self-pay

## 2023-06-12 DIAGNOSIS — W1830XA Fall on same level, unspecified, initial encounter: Secondary | ICD-10-CM | POA: Insufficient documentation

## 2023-06-12 DIAGNOSIS — Z7901 Long term (current) use of anticoagulants: Secondary | ICD-10-CM | POA: Diagnosis not present

## 2023-06-12 DIAGNOSIS — M25511 Pain in right shoulder: Secondary | ICD-10-CM | POA: Insufficient documentation

## 2023-06-12 DIAGNOSIS — M25551 Pain in right hip: Secondary | ICD-10-CM

## 2023-06-12 DIAGNOSIS — I6782 Cerebral ischemia: Secondary | ICD-10-CM | POA: Diagnosis not present

## 2023-06-12 DIAGNOSIS — W19XXXA Unspecified fall, initial encounter: Secondary | ICD-10-CM

## 2023-06-12 DIAGNOSIS — I110 Hypertensive heart disease with heart failure: Secondary | ICD-10-CM | POA: Diagnosis not present

## 2023-06-12 DIAGNOSIS — S2241XA Multiple fractures of ribs, right side, initial encounter for closed fracture: Secondary | ICD-10-CM

## 2023-06-12 DIAGNOSIS — N189 Chronic kidney disease, unspecified: Secondary | ICD-10-CM | POA: Diagnosis not present

## 2023-06-12 DIAGNOSIS — I509 Heart failure, unspecified: Secondary | ICD-10-CM | POA: Diagnosis not present

## 2023-06-12 DIAGNOSIS — S0990XA Unspecified injury of head, initial encounter: Secondary | ICD-10-CM | POA: Diagnosis not present

## 2023-06-12 DIAGNOSIS — I129 Hypertensive chronic kidney disease with stage 1 through stage 4 chronic kidney disease, or unspecified chronic kidney disease: Secondary | ICD-10-CM | POA: Diagnosis not present

## 2023-06-12 LAB — CBC
HCT: 35.2 % — ABNORMAL LOW (ref 39.0–52.0)
Hemoglobin: 11.1 g/dL — ABNORMAL LOW (ref 13.0–17.0)
MCH: 28.8 pg (ref 26.0–34.0)
MCHC: 31.5 g/dL (ref 30.0–36.0)
MCV: 91.4 fL (ref 80.0–100.0)
Platelets: 185 10*3/uL (ref 150–400)
RBC: 3.85 MIL/uL — ABNORMAL LOW (ref 4.22–5.81)
RDW: 14.9 % (ref 11.5–15.5)
WBC: 5.4 10*3/uL (ref 4.0–10.5)
nRBC: 0 % (ref 0.0–0.2)

## 2023-06-12 LAB — URINALYSIS, ROUTINE W REFLEX MICROSCOPIC
Bilirubin Urine: NEGATIVE
Glucose, UA: NEGATIVE mg/dL
Hgb urine dipstick: NEGATIVE
Ketones, ur: NEGATIVE mg/dL
Leukocytes,Ua: NEGATIVE
Nitrite: NEGATIVE
Protein, ur: NEGATIVE mg/dL
Specific Gravity, Urine: 1.019 (ref 1.005–1.030)
pH: 5 (ref 5.0–8.0)

## 2023-06-12 LAB — BASIC METABOLIC PANEL WITH GFR
Anion gap: 7 (ref 5–15)
BUN: 22 mg/dL (ref 8–23)
CO2: 22 mmol/L (ref 22–32)
Calcium: 9.3 mg/dL (ref 8.9–10.3)
Chloride: 107 mmol/L (ref 98–111)
Creatinine, Ser: 1.03 mg/dL (ref 0.61–1.24)
GFR, Estimated: >60 mL/min (ref >60)
Glucose, Bld: 89 mg/dL (ref 70–99)
Potassium: 4.1 mmol/L (ref 3.5–5.1)
Sodium: 136 mmol/L (ref 135–145)

## 2023-06-12 LAB — HEPATIC FUNCTION PANEL
ALT: 15 U/L (ref 0–44)
AST: 25 U/L (ref 15–41)
Albumin: 3.5 g/dL (ref 3.5–5.0)
Alkaline Phosphatase: 60 U/L (ref 38–126)
Bilirubin, Direct: <0.1 mg/dL (ref 0.0–0.2)
Total Bilirubin: 0.7 mg/dL (ref 0.0–1.2)
Total Protein: 6.8 g/dL (ref 6.5–8.1)

## 2023-06-12 LAB — MAGNESIUM: Magnesium: 1.7 mg/dL (ref 1.7–2.4)

## 2023-06-12 MED ORDER — OXYCODONE-ACETAMINOPHEN 5-325 MG PO TABS: 1.0000 | ORAL_TABLET | ORAL | 0 refills | Status: DC | PRN

## 2023-06-12 MED ORDER — OXYCODONE-ACETAMINOPHEN 5-325 MG PO TABS
1.0000 | ORAL_TABLET | Freq: Once | ORAL | Status: AC
  Administered 2023-06-12: 1 via ORAL
  Filled 2023-06-12: qty 1

## 2023-06-12 MED ORDER — LIDOCAINE 5 % EX PTCH: 1.0000 | MEDICATED_PATCH | Freq: Two times a day (BID) | CUTANEOUS | 0 refills | Status: DC

## 2023-06-12 MED ORDER — LEVETIRACETAM 500 MG PO TABS
1500.0000 mg | ORAL_TABLET | Freq: Once | ORAL | Status: AC
  Administered 2023-06-12: 1500 mg via ORAL
  Filled 2023-06-12: qty 3

## 2023-06-12 MED ORDER — MAGNESIUM OXIDE -MG SUPPLEMENT 400 (240 MG) MG PO TABS
400.0000 mg | ORAL_TABLET | Freq: Once | ORAL | Status: AC
  Administered 2023-06-12: 400 mg via ORAL
  Filled 2023-06-12: qty 1

## 2023-06-12 MED ORDER — LIDOCAINE 5 % EX PTCH
1.0000 | MEDICATED_PATCH | CUTANEOUS | Status: DC
  Administered 2023-06-12: 1 via TRANSDERMAL
  Filled 2023-06-12: qty 1

## 2023-06-12 NOTE — ED Notes (Signed)
 This RN attempted to call sister x2 and niece. Niece stated she would call sister for transport back home., Pt is AxO and ambulatory.

## 2023-06-12 NOTE — ED Triage Notes (Addendum)
 Pt to ED via POV from home. Pt ambulatory to triage. Pt reports fell 2 days ago after feeling dizzy. Pt reports right shoulder pain, right hip pain and right flank pain. Pt denies LOC. Pt is on blood thinner. Pt denies SOB.   Pt reports daily etoh use

## 2023-06-12 NOTE — ED Notes (Signed)
 Pt ambulated to bathroom w steady gate and returned to bed without incident. Cb within reach. NAD

## 2023-06-12 NOTE — ED Provider Notes (Addendum)
 [facility] Provider Note    Event Date/Time   First MD Initiated Contact with Patient 06/12/23 1203     (approximate)   History   Chief Complaint Hip Pain, Shoulder Pain, and Flank Pain   HPI  John Spears is a 73 y.o. male with past medical history of hypertension, seizures, atrial fibrillation on Xarelto , CHF, CKD, and alcohol abuse who presents to the ED complaining of hip pain.  Patient reports that 3 days ago his "legs gave out on me," causing him to the fall to the ground and onto his right side.  He is not sure whether he hit his head, denies losing consciousness.  He complains of pain in his right hip as well as his right shoulder.  He denies significant chest pain or abdominal pain, has been ambulatory since the fall.  He denies any ongoing weakness or other falls, does continue to drink alcohol on a daily basis with his last drink coming last night.     Physical Exam   Triage Vital Signs: ED Triage Vitals [06/12/23 1109]  Encounter Vitals Group     BP (!) 164/89     Systolic BP Percentile      Diastolic BP Percentile      Pulse Rate (!) 55     Resp 18     Temp 98.1 F (36.7 C)     Temp Source Oral     SpO2 98 %     Weight      Height      Head Circumference      Peak Flow      Pain Score 8     Pain Loc      Pain Education      Exclude from Growth Chart     Most recent vital signs: Vitals:   06/12/23 1300 06/12/23 1430  BP: (!) 185/72 (!) 175/71  Pulse:  (!) 52  Resp:  16  Temp:    SpO2:  100%    Constitutional: Alert and oriented. Eyes: Conjunctivae are normal. Head: Atraumatic. Nose: No congestion/rhinnorhea. Mouth/Throat: Mucous membranes are moist.  Neck: Midline cervical spine tenderness to palpation noted. Cardiovascular: Normal rate, regular rhythm. Grossly normal heart sounds.  2+ radial pulses bilaterally. Respiratory: Normal respiratory effort.  No retractions. Lungs CTAB.  No chest wall tenderness to  palpation. Gastrointestinal: Soft and nontender. No distention. Musculoskeletal: Diffuse tenderness to palpation of right shoulder with no obvious deformity.  Diffuse tenderness to palpation of right hip with no obvious deformity. Neurologic:  Normal speech and language. No gross focal neurologic deficits are appreciated.    ED Results / Procedures / Treatments   Labs (all labs ordered are listed, but only abnormal results are displayed) Labs Reviewed  CBC - Abnormal; Notable for the following components:      Result Value   RBC 3.85 (*)    Hemoglobin 11.1 (*)    HCT 35.2 (*)    All other components within normal limits  URINALYSIS, ROUTINE W REFLEX MICROSCOPIC - Abnormal; Notable for the following components:   Color, Urine YELLOW (*)    APPearance CLEAR (*)    All other components within normal limits  BASIC METABOLIC PANEL WITH GFR  MAGNESIUM   HEPATIC FUNCTION PANEL  CBG MONITORING, ED     EKG  ED ECG REPORT I, Twilla Galea, the attending physician, personally viewed and interpreted this ECG.   Date: 06/12/2023  EKG Time: 11:13  Rate: 54  Rhythm:  sinus bradycardia  Axis: LAD  Intervals:right bundle branch block and left anterior fascicular block  ST&T Change: None  RADIOLOGY CT head reviewed and interpreted by me with no hemorrhage or midline shift.  PROCEDURES:  Critical Care performed: No  Procedures   MEDICATIONS ORDERED IN ED: Medications  lidocaine  (LIDODERM ) 5 % 1 patch (1 patch Transdermal Patch Applied 06/12/23 1252)  oxyCODONE -acetaminophen  (PERCOCET/ROXICET) 5-325 MG per tablet 1 tablet (has no administration in time range)  magnesium  oxide (MAG-OX) tablet 400 mg (400 mg Oral Given 06/12/23 1255)  levETIRAcetam  (KEPPRA ) tablet 1,500 mg (1,500 mg Oral Given 06/12/23 1355)     IMPRESSION / MDM / ASSESSMENT AND PLAN / ED COURSE  I reviewed the triage vital signs and the nursing notes.                              73 y.o. male with past  medical history of hypertension, atrial fibrillation on Xarelto , CHF, seizures, CKD, and alcohol abuse who presents to the ED following fall 3 days ago, now complains of right hip and shoulder pain.  Patient's presentation is most consistent with acute presentation with potential threat to life or bodily function.  Differential diagnosis includes, but is not limited to, intracranial injury, cervical spine injury, fracture, dislocation, contusion, anemia, electrolyte abnormality, AKI.  Patient nontoxic-appearing and in no acute distress, vital signs remarkable for bradycardia but otherwise reassuring.  X-rays of right shoulder, right ribs, and right hip are pending.  CT head and cervical spine are negative for acute traumatic injury.  Labs are reassuring with no significant anemia, leukocytosis, electrolyte abnormality, or AKI.  LFTs and magnesium  level are also unremarkable.  CT head and cervical spine are negative for acute process.  X-rays of right shoulder and right hip are unremarkable.  Patient with minimally displaced fractures of ribs 8 and 9 on the right.  Patient able to ambulate here in the ED with minimal difficulty and is appropriate for discharge home.  He states that he has been having ongoing issues with his right hip even prior to the fall, will refer to orthopedics for follow-up.  He was counseled to return to the ED for new or worsening symptoms, patient agrees with plan.      FINAL CLINICAL IMPRESSION(S) / ED DIAGNOSES   Final diagnoses:  Fall, initial encounter  Right hip pain  Closed fracture of multiple ribs of right side, initial encounter     Rx / DC Orders   ED Discharge Orders          Ordered    lidocaine  (LIDODERM ) 5 %  Every 12 hours        06/12/23 1518    oxyCODONE -acetaminophen  (PERCOCET) 5-325 MG tablet  Every 4 hours PRN        06/12/23 1522             Note:  This document was prepared using Dragon voice recognition software and may include  unintentional dictation errors.   Twilla Galea, MD 06/12/23 1519    Twilla Galea, MD 06/12/23 [phone]

## 2023-06-29 ENCOUNTER — Ambulatory Visit: Admitting: Cardiology

## 2023-07-05 ENCOUNTER — Telehealth: Payer: Self-pay | Admitting: Internal Medicine

## 2023-07-05 ENCOUNTER — Encounter: Payer: Self-pay | Admitting: Cardiology

## 2023-07-05 ENCOUNTER — Ambulatory Visit: Attending: Cardiology | Admitting: Cardiology

## 2023-07-05 VITALS — BP 138/74 | HR 54 | Ht 71.0 in | Wt 196.2 lb

## 2023-07-05 DIAGNOSIS — I480 Paroxysmal atrial fibrillation: Secondary | ICD-10-CM | POA: Diagnosis not present

## 2023-07-05 DIAGNOSIS — R569 Unspecified convulsions: Secondary | ICD-10-CM

## 2023-07-05 DIAGNOSIS — E782 Mixed hyperlipidemia: Secondary | ICD-10-CM

## 2023-07-05 DIAGNOSIS — F1010 Alcohol abuse, uncomplicated: Secondary | ICD-10-CM

## 2023-07-05 DIAGNOSIS — I453 Trifascicular block: Secondary | ICD-10-CM

## 2023-07-05 DIAGNOSIS — I5032 Chronic diastolic (congestive) heart failure: Secondary | ICD-10-CM | POA: Diagnosis not present

## 2023-07-05 DIAGNOSIS — I10 Essential (primary) hypertension: Secondary | ICD-10-CM | POA: Diagnosis not present

## 2023-07-05 MED ORDER — CARVEDILOL 12.5 MG PO TABS: 12.5000 mg | ORAL_TABLET | Freq: Two times a day (BID) | ORAL | 3 refills | Status: DC

## 2023-07-05 NOTE — Telephone Encounter (Signed)
   Pre-operative Risk Assessment    Patient Name: John Spears  DOB: [birth year] MRN: [id number]   Date of last office visit: unknown Date of next office visit: [id number]  Request for Surgical Clearance    Procedure:  Dental Extraction - Amount of Teeth to be Pulled:  5  Date of Surgery:  Clearance TBD                                Surgeon:  DR. Florette Hurry Group or Practice Name:  [facility] Phone number:  [phone] Fax number:  [phone]   Type of Clearance Requested:   - Pharmacy:  Hold instructions say hold blood thinners prior to procedure  0   Type of Anesthesia:  Local    Additional requests/questions:    [name]   07/05/2023, 3:18 PM

## 2023-07-05 NOTE — Progress Notes (Signed)
 Cardiology Office Note:  .   Date:  07/05/2023  ID:  John Spears, DOB 1950-05-25, MRN [id number] PCP: Patient, No Pcp Per   [facility] Providers Cardiologist:  Sammy Crisp, MD    History of Present Illness: John Spears is a 73 y.o. male with a past medical history of cardiomyopathy, chronic HFimp EF, hypertension, seizure disorder, alcohol abuse, mixed hyperlipidemia, paroxysmal atrial fibrillation, who is here today for follow-up of his HFimpEF and paroxysmal atrial fibrillation.   Was admitted to [facility] in December 2023 after being found unresponsive by a friend.  He was initially intubated requiring vasopressor support with concern he may have been seizures prior to arrival.  Stroke workup was unremarkable.  Echocardiogram revealed an LVEF of 35-40%, moderate asymmetric hypertrophy of the basal septum and mid/apical, anterior/anterolateral hypokinesis.  PA pressures were moderately elevated and no significant valvular disease was noted.  High-sensitivity troponin peaked at 291.  He was followed by cardiology and was started on GDMT including beta-blocker, Entresto , spironolactone , daily Lasix .  He was not felt to be a good candidate for diagnostic catheterization due to ongoing encephalopathy.  He had been treated for aspiration pneumonia and was subsequently discharged to follow-up in clinic.  Seen in clinic 03/2022, accompanied by family member, during this visit he was with his only complaint being persistent chest congestion and nonproductive cough as well as nasal congestion. He was scheduled for blood work and a limited prescription. He was also sent for chest x-ray and encouraged to use nasal spray and over-the-counter guaifenesin .  He was then seen in clinic again in April 2024 stated he had been taking his medications as prescribed.  His sister had an shortage of medication making him feel impact so he was unaware of his current doses.  He denied any chest pain or  shortness of breath.  He was continued to work on escalating GDMT although he was not a candidate for SGLT2 inhibitor due to obesity, sedentary state, history of noncompliance.  It was discussed that he continued to the need for an ischemic evaluation and did not call us  to only have an updated echocardiogram completed at that time.  He continued to drink daily and was encouraged to abstain. he was seen in clinic following, 07/2022 where he reported he had been doing well.  After having repeat echocardiogram done and received a notification that his echo was normal he was placed back on his Entresto , spironolactone , and potassium.  He also continued to decline to pursue an ischemic evaluation.  He was admitted at [facility] 09/25/2022 discharge 09/26/2022 with primary complaint of chest pain.  High-sensitivity troponins were in the 40s.  EKG revealed sinus rhythm with fascicular block.  He underwent stress testing for continued ischemic evaluation that showed no significant ischemia was considered a low risk scan.  He was considered stable and was able to be discharged home.  Unfortunately he was again admitted to the hospital 02/05/2023 for acute metabolic encephalopathy.  Patient presented to the [facility] emergency department after patient's sister found him lying on the floor, 911 she was concern of seizure activity.  She had noted he had been compliant with his medications but was unclear about his alcohol use.  EMS noted seizure activity, right tremor, leftward gaze.  He was given 5 mg of Versed  which did not break his seizure activity and subsequent 2.5 mg with abatement.  He was intubated immediately upon arrival and encouraged for code was called and he was  taken to CT with no evidence of bleed.  He was loaded with Keppra  and started on propofol /fentanyl  and admitted under the service of PCCM.  He was followed by neurology in the hospital and underwent an EEG.  Keppra  dosing was uptitrated to 1500 mg twice daily.  He was  placed on Eliquis  secondary to stroke prophylaxis for paroxysmal atrial fibrillation with a CHA2DS2-VASc score of 5 and aspirin  was discontinued to decrease the risk of bleeding with his Eliquis .  He was considered stable and discharged to skilled nursing facility on 02/10/2023.  He was again readmitted to [facility] from 1/14 - 03/16/2023 for acute metabolic encephalopathy secondary to seizures.  He had breakthrough seizures, and went up having his Keppra  increased to 1500 mg twice daily and started on Vimpat  150 mg twice daily.  He remained uneventful for 48 hours.  He was to be continued on seizure precautions and rehab was recommended.  On 1/17 he was preparing for discharge to SNF however revealed 3 short lasting generalized tonic-clonic seizures witnessed in the hospital.  He also was found to have a low-grade fever and to have a rhinovirus infection.  On 1/18 he had another episode of seizure-like activity after walking back from the bathroom.  Multiple adjustments were done to his seizure medications and he had remained stable was able to transfer to SNF on 03/16/2023.   He was last seen in clinic 05/13/2023 of at that time he been doing well from a cardiac perspective without symptoms of angina or anginal equivalents.  He had previously been treated for a rash and apixaban  dosing was decreased by his PCP.  At that time concern for allergy to apixaban  he was started on Xarelto  with apixaban  discontinued.  There were no other medication changes that were made or further testing that was ordered at that time.  He returns to clinic today accompanied by family member.  He states that he has been doing well from a cardiac perspective without chest pain, shortness of breath, peripheral edema or palpitations.  He states that he has had a few hospital visits 1 for fall with pain to hips and legs and the other for seizure.  He does require clearance today for tooth extraction as they brought a form in with him today  wanting to know how long to hold his Xarelto .  He states that he has been compliant with his medication regimen as his medications are bubble packed.  He states he has not missed any doses of his Xarelto .  He also denies any bleeding with no blood noted in his urine or stool.  ROS: 10 point review of systems has been reviewed and considered negative except ones been listed in the HPI  Studies Reviewed: Aaron Aas   EKG Interpretation Date/Time:  Monday Jul 05 2023 14:29:29 EDT Ventricular Rate:  54 PR Interval:  302 QRS Duration:  136 QT Interval:  446 QTC Calculation: 422 R Axis:   -61  Text Interpretation: Sinus bradycardia with 1st degree A-V block Left anterior fascicular block Right bundle branch block Artifact Trifascicular block When compared with ECG of 12-Jun-2023 11:13, Reconfirmed by Ronald Cockayne ([id number]) on 07/05/2023 4:22:15 PM    Lexiscan  Myoview  09/25/2022  Pharmacological myocardial perfusion imaging study with no significant  ischemia Normal wall motion, EF estimated at 67% No EKG changes concerning for ischemia at peak stress or in recovery. CT attenuation correction images with no significant coronary calcification, minimal aortic atherosclerosis Low risk scan   2D echo 06/24/2022 1.  Left ventricular ejection fraction, by estimation, is 60 to 65%. The  left ventricle has normal function. There is mild left ventricular  hypertrophy. Left ventricular diastolic parameters are consistent with  Grade I diastolic dysfunction (impaired  relaxation).   2. Right ventricular systolic function is normal.   3. The mitral valve is normal in structure. Trivial mitral valve  regurgitation.   Comparison(s): Previous LVEF reported as 30-35%.    TTE 02/24/22 1. Left ventricular ejection fraction, by estimation, is 30 to 35%. The  left ventricle has moderately decreased function. The left ventricle  demonstrates regional wall motion abnormalities (see scoring  diagram/findings for  description). There is  moderate asymmetric left ventricular hypertrophy of the basal-septal  segment. Left ventricular diastolic parameters are indeterminate.   2. Right ventricular systolic function is normal. The right ventricular  size is normal. There is moderately elevated pulmonary artery systolic  pressure. The estimated right ventricular systolic pressure is 52.1 mmHg.   3. The mitral valve is normal in structure. Trivial mitral valve  regurgitation.   4. The aortic valve is tricuspid. Aortic valve regurgitation is not  visualized. No aortic stenosis is present.   5. The inferior vena cava is normal in size with <50% respiratory  variability, suggesting right atrial pressure of 8 mmHg.  Risk Assessment/Calculations:    CHA2DS2-VASc Score = 5   This indicates a 7.2% annual risk of stroke. The patient's score is based upon: CHF History: 1 HTN History: 1 Diabetes History: 0 Stroke History: 2 Vascular Disease History: 0 Age Score: 1 Gender Score: 0            Physical Exam:   VS:  BP 138/74 (BP Location: Left Arm, Patient Position: Sitting, Cuff Size: Normal)   Pulse (!) 54   Ht 5\' 11"  (1.803 m)   Wt 196 lb 3.2 oz (89 kg)   SpO2 99%   BMI 27.36 kg/m    Wt Readings from Last 3 Encounters:  07/05/23 196 lb 3.2 oz (89 kg)  06/04/23 157 lb 6.5 oz (71.4 kg)  05/17/23 170 lb (77.1 kg)    GEN: Well nourished, well developed in no acute distress NECK: No JVD; No carotid bruits CARDIAC: RRR, no murmurs, rubs, gallops RESPIRATORY:  Clear to auscultation without rales, wheezing or rhonchi  ABDOMEN: Soft, non-tender, non-distended EXTREMITIES:  No edema; No deformity   ASSESSMENT AND PLAN: .   Paroxysmal atrial fibrillation maintaining sinus rhythm on exam today.  He has been continued on rivaroxaban  20 mg daily and carvedilol  25 mg twice daily decreased to 12.5 mg twice daily.  He has had no further issues with rash after being changed from apixaban  to rivaroxaban .  He has  continued on oral anticoagulant for CHA2DS2-VASc score of at least 5 for stroke prophylaxis.  He has been tolerating the medication without incident or side effect.  He also denies any bleeding with no blood noted in his urine or stool.  HFimpEF with last echocardiogram completed in 06/2022 revealed LVEF of 60 to 65%, mild LVH, G1 DD, normal right ventricular function trivial MR.  He appears to be euvolemic on exam.  He is continued on carvedilol  25 mg twice daily, furosemide  20 mg daily.  Previously was on Entresto , spironolactone , potassium which also had to be discontinued due to elevated serum creatinine.  He was restarted on furosemide  20 mg daily after return to normal serum creatinine.  Discussed restarting Entresto  with the patient and sister today but they would like to defer  restarting any new medications at this time due to issues previously with patient's kidney function and skin rashes.  Primary hypertension with a blood pressure 138/74.  He has been continued on amlodipine  10 mg daily at bedtime and carvedilol  12.5 mg twice daily as well as furosemide  20 mg daily.  They have been encouraged to continue to monitor his pressure 1 to 2 hours postmedication administration at home as well.  Mixed hyperlipidemia with last LDL of 76.  She is continued on atorvastatin  40 mg daily.  Seizure disorder with recent emergency department visit for seizure activity.  He continues to be followed by neurology and has been continued on his antiseizure medication regimen.  EtOH abuse with continued total cessation recommended.  Trifascicular block on EKG that has been chronic on review of EKGs.  EKG today revealed sinus bradycardia with a rate of 54, first-degree AV block, right bundle branch block, left intrafascicular block, with no significant change.  With first-degree AV block and sinus bradycardia starting in March or April of this year we will decrease his carvedilol  from 25 mg twice daily to 12.5 mg  twice daily.       Dispo: Patient return to clinic to see MD/APP in 6 months or sooner.  They returned to clinic today with a form from the oral surgeon questioning recommendations on holding Xarelto  prior to tooth extraction.  Form was faxed to preoperative pool pharmacy for recommendations  Signed, Starlina Lapre, NP

## 2023-07-05 NOTE — Progress Notes (Signed)
 Called patients sister to advise of change in Coreg  to 12.5mg  BID and new RX sent to pharmacy - sister verbalized understanding with plan

## 2023-07-05 NOTE — Patient Instructions (Signed)
 Medication Instructions:  Your Physician recommend you continue on your current medication as directed.    *If you need a refill on your cardiac medications before your next appointment, please call your pharmacy*  Lab Work: No labs ordered today  If you have labs (blood work) drawn today and your tests are completely normal, you will receive your results only by: MyChart Message (if you have MyChart) OR A paper copy in the mail If you have any lab test that is abnormal or we need to change your treatment, we will call you to review the results.  Testing/Procedures: No test ordered today   Follow-Up: At [facility], you and your health needs are our priority.  As part of our continuing mission to provide you with exceptional heart care, our providers are all part of one team.  This team includes your primary Cardiologist (physician) and Advanced Practice Providers or APPs (Physician Assistants and Nurse Practitioners) who all work together to provide you with the care you need, when you need it.  Your next appointment:   6 month(s)  Provider:   Sammy Crisp, MD or Ronald Cockayne, NP    We recommend signing up for the patient portal called "MyChart".  Sign up information is provided on this After Visit Summary.  MyChart is used to connect with patients for Virtual Visits (Telemedicine).  Patients are able to view lab/test results, encounter notes, upcoming appointments, etc.  Non-urgent messages can be sent to your provider as well.   To learn more about what you can do with MyChart, go to [email or web address].

## 2023-07-06 NOTE — Telephone Encounter (Signed)
 Please advise holding Xarelto  prior to extraction of 5 teeth.   Thank you!  DW

## 2023-07-06 NOTE — Telephone Encounter (Signed)
 Patient with diagnosis of A Fib on Xarelto  for anticoagulation.    Procedure: Dental Extraction - Amount of Teeth to be Pulled:  5  Date of procedure: TBD   CHA2DS2-VASc Score = 5  This indicates a 7.2% annual risk of stroke. The patient's score is based upon: CHF History: 1 HTN History: 1 Diabetes History: 0 Stroke History: 2 Vascular Disease History: 0 Age Score: 1 Gender Score: 0   CrCl 83 ml/min Platelet count 185K  Patient does not require pre-op antibiotics for dental procedure.  Per office protocol, patient can hold Xarelto  for 1 day prior to procedure.     **This guidance is not considered finalized until pre-operative APP has relayed final recommendations.**

## 2023-07-07 NOTE — Telephone Encounter (Signed)
   Patient Name: John Spears  DOB: 03/18/1950 MRN: [id number]  Primary Cardiologist: Sammy Crisp, MD  Clinical pharmacists have reviewed the patient's past medical history, labs, and current medications as part of preoperative protocol coverage. The following recommendations have been made:  Patient with diagnosis of A Fib on Xarelto  for anticoagulation.     Procedure: Dental Extraction - Amount of Teeth to be Pulled:  5  Date of procedure: TBD     CHA2DS2-VASc Score = 5  This indicates a 7.2% annual risk of stroke. The patient's score is based upon: CHF History: 1 HTN History: 1 Diabetes History: 0 Stroke History: 2 Vascular Disease History: 0 Age Score: 1 Gender Score: 0   CrCl 83 ml/min Platelet count 185K   Patient does not require pre-op antibiotics for dental procedure.   Per office protocol, patient can hold Xarelto  for 1 day prior to procedure.  Please resume Xarelto  as soon as possible postprocedure, at the discretion of the surgeon.   I will route this recommendation to the requesting party via Epic fax function and remove from pre-op pool.  Please call with questions.  Jude Norton, NP 07/07/2023, 8:42 AM

## 2023-07-08 ENCOUNTER — Other Ambulatory Visit: Payer: Self-pay | Admitting: Gastroenterology

## 2023-07-08 DIAGNOSIS — R933 Abnormal findings on diagnostic imaging of other parts of digestive tract: Secondary | ICD-10-CM

## 2023-07-17 ENCOUNTER — Other Ambulatory Visit: Payer: Self-pay | Admitting: Gastroenterology

## 2023-07-17 ENCOUNTER — Ambulatory Visit
Admission: RE | Admit: 2023-07-17 | Discharge: 2023-07-17 | Disposition: A | Discharge status: Home / Self Care | Source: Ambulatory Visit | Attending: Gastroenterology | Admitting: Gastroenterology

## 2023-07-17 DIAGNOSIS — R933 Abnormal findings on diagnostic imaging of other parts of digestive tract: Secondary | ICD-10-CM

## 2023-07-17 MED ORDER — GADOBUTROL 1 MMOL/ML IV SOLN
7.5000 mL | Freq: Once | INTRAVENOUS | Status: AC | PRN
  Administered 2023-07-17: 7.5 mL via INTRAVENOUS

## 2023-07-23 ENCOUNTER — Emergency Department
Admission: EM | Admit: 2023-07-23 | Discharge: 2023-07-23 | Disposition: A | Discharge status: Home / Self Care | Source: Home / Self Care | Attending: Emergency Medicine | Admitting: Emergency Medicine

## 2023-07-23 ENCOUNTER — Emergency Department

## 2023-07-23 ENCOUNTER — Inpatient Hospital Stay
Admission: EM | Admit: 2023-07-23 | Discharge: 2023-08-04 | DRG: 100 | Disposition: A | Discharge status: Skilled Nursing Facility | Attending: Family Medicine | Admitting: Family Medicine

## 2023-07-23 ENCOUNTER — Other Ambulatory Visit: Payer: Self-pay

## 2023-07-23 DIAGNOSIS — Z820 Family history of epilepsy and other diseases of the nervous system: Secondary | ICD-10-CM

## 2023-07-23 DIAGNOSIS — F1010 Alcohol abuse, uncomplicated: Secondary | ICD-10-CM | POA: Diagnosis present

## 2023-07-23 DIAGNOSIS — R251 Tremor, unspecified: Secondary | ICD-10-CM | POA: Insufficient documentation

## 2023-07-23 DIAGNOSIS — G928 Other toxic encephalopathy: Secondary | ICD-10-CM | POA: Diagnosis present

## 2023-07-23 DIAGNOSIS — N401 Enlarged prostate with lower urinary tract symptoms: Secondary | ICD-10-CM

## 2023-07-23 DIAGNOSIS — Z7901 Long term (current) use of anticoagulants: Secondary | ICD-10-CM | POA: Insufficient documentation

## 2023-07-23 DIAGNOSIS — K068 Other specified disorders of gingiva and edentulous alveolar ridge: Secondary | ICD-10-CM | POA: Diagnosis present

## 2023-07-23 DIAGNOSIS — I4891 Unspecified atrial fibrillation: Secondary | ICD-10-CM | POA: Diagnosis present

## 2023-07-23 DIAGNOSIS — N400 Enlarged prostate without lower urinary tract symptoms: Secondary | ICD-10-CM | POA: Diagnosis present

## 2023-07-23 DIAGNOSIS — Z7982 Long term (current) use of aspirin: Secondary | ICD-10-CM

## 2023-07-23 DIAGNOSIS — G40901 Epilepsy, unspecified, not intractable, with status epilepticus: Principal | ICD-10-CM | POA: Diagnosis present

## 2023-07-23 DIAGNOSIS — Z96641 Presence of right artificial hip joint: Secondary | ICD-10-CM | POA: Diagnosis present

## 2023-07-23 DIAGNOSIS — R569 Unspecified convulsions: Secondary | ICD-10-CM

## 2023-07-23 DIAGNOSIS — Z87891 Personal history of nicotine dependence: Secondary | ICD-10-CM

## 2023-07-23 DIAGNOSIS — Z91198 Patient's noncompliance with other medical treatment and regimen for other reason: Secondary | ICD-10-CM | POA: Insufficient documentation

## 2023-07-23 DIAGNOSIS — I110 Hypertensive heart disease with heart failure: Secondary | ICD-10-CM | POA: Insufficient documentation

## 2023-07-23 DIAGNOSIS — E876 Hypokalemia: Secondary | ICD-10-CM | POA: Diagnosis not present

## 2023-07-23 DIAGNOSIS — Z8673 Personal history of transient ischemic attack (TIA), and cerebral infarction without residual deficits: Secondary | ICD-10-CM

## 2023-07-23 DIAGNOSIS — T426X6A Underdosing of other antiepileptic and sedative-hypnotic drugs, initial encounter: Secondary | ICD-10-CM | POA: Diagnosis present

## 2023-07-23 DIAGNOSIS — I509 Heart failure, unspecified: Secondary | ICD-10-CM | POA: Insufficient documentation

## 2023-07-23 DIAGNOSIS — I10 Essential (primary) hypertension: Secondary | ICD-10-CM | POA: Diagnosis present

## 2023-07-23 DIAGNOSIS — D649 Anemia, unspecified: Secondary | ICD-10-CM | POA: Diagnosis present

## 2023-07-23 DIAGNOSIS — R404 Transient alteration of awareness: Principal | ICD-10-CM

## 2023-07-23 DIAGNOSIS — E785 Hyperlipidemia, unspecified: Secondary | ICD-10-CM | POA: Diagnosis present

## 2023-07-23 DIAGNOSIS — I480 Paroxysmal atrial fibrillation: Secondary | ICD-10-CM | POA: Diagnosis not present

## 2023-07-23 DIAGNOSIS — G8384 Todd's paralysis (postepileptic): Secondary | ICD-10-CM | POA: Diagnosis present

## 2023-07-23 DIAGNOSIS — Z79899 Other long term (current) drug therapy: Secondary | ICD-10-CM

## 2023-07-23 DIAGNOSIS — Z8249 Family history of ischemic heart disease and other diseases of the circulatory system: Secondary | ICD-10-CM

## 2023-07-23 DIAGNOSIS — A4151 Sepsis due to Escherichia coli [E. coli]: Secondary | ICD-10-CM | POA: Diagnosis not present

## 2023-07-23 DIAGNOSIS — Z91128 Patient's intentional underdosing of medication regimen for other reason: Secondary | ICD-10-CM

## 2023-07-23 DIAGNOSIS — I5032 Chronic diastolic (congestive) heart failure: Secondary | ICD-10-CM | POA: Diagnosis present

## 2023-07-23 DIAGNOSIS — I452 Bifascicular block: Secondary | ICD-10-CM | POA: Diagnosis present

## 2023-07-23 DIAGNOSIS — Z91148 Patient's other noncompliance with medication regimen for other reason: Secondary | ICD-10-CM

## 2023-07-23 LAB — CBC
HCT: 35.1 % — ABNORMAL LOW (ref 39.0–52.0)
HCT: 30.6 % — ABNORMAL LOW (ref 39.0–52.0)
Hemoglobin: 10.9 g/dL — ABNORMAL LOW (ref 13.0–17.0)
Hemoglobin: 10 g/dL — ABNORMAL LOW (ref 13.0–17.0)
MCH: 29 pg (ref 26.0–34.0)
MCH: 29.2 pg (ref 26.0–34.0)
MCHC: 31.1 g/dL (ref 30.0–36.0)
MCHC: 32.7 g/dL (ref 30.0–36.0)
MCV: 93.4 fL (ref 80.0–100.0)
MCV: 89.2 fL (ref 80.0–100.0)
Platelets: 150 10*3/uL (ref 150–400)
Platelets: 167 10*3/uL (ref 150–400)
RBC: 3.76 MIL/uL — ABNORMAL LOW (ref 4.22–5.81)
RBC: 3.43 MIL/uL — ABNORMAL LOW (ref 4.22–5.81)
RDW: 16 % — ABNORMAL HIGH (ref 11.5–15.5)
RDW: 15.9 % — ABNORMAL HIGH (ref 11.5–15.5)
WBC: 5.6 10*3/uL (ref 4.0–10.5)
WBC: 6 10*3/uL (ref 4.0–10.5)
nRBC: 0 % (ref 0.0–0.2)
nRBC: 0 % (ref 0.0–0.2)

## 2023-07-23 LAB — BASIC METABOLIC PANEL WITH GFR
Anion gap: 11 (ref 5–15)
BUN: 17 mg/dL (ref 8–23)
CO2: 22 mmol/L (ref 22–32)
Calcium: 9.3 mg/dL (ref 8.9–10.3)
Chloride: 107 mmol/L (ref 98–111)
Creatinine, Ser: 1.01 mg/dL (ref 0.61–1.24)
GFR, Estimated: >60 mL/min (ref >60)
Glucose, Bld: 86 mg/dL (ref 70–99)
Potassium: 4.8 mmol/L (ref 3.5–5.1)
Sodium: 140 mmol/L (ref 135–145)

## 2023-07-23 LAB — PROTIME-INR
INR: 1.2 (ref 0.8–1.2)
Prothrombin Time: 15.8 s — ABNORMAL HIGH (ref 11.4–15.2)

## 2023-07-23 LAB — DIFFERENTIAL
Abs Immature Granulocytes: 0.01 10*3/uL (ref 0.00–0.07)
Basophils Absolute: 0 10*3/uL (ref 0.0–0.1)
Basophils Relative: 1 %
Eosinophils Absolute: 0.1 10*3/uL (ref 0.0–0.5)
Eosinophils Relative: 2 %
Immature Granulocytes: 0 %
Lymphocytes Relative: 24 %
Lymphs Abs: 1.4 10*3/uL (ref 0.7–4.0)
Monocytes Absolute: 1.1 10*3/uL — ABNORMAL HIGH (ref 0.1–1.0)
Monocytes Relative: 18 %
Neutro Abs: 3.4 10*3/uL (ref 1.7–7.7)
Neutrophils Relative %: 55 %

## 2023-07-23 LAB — COMPREHENSIVE METABOLIC PANEL WITH GFR
ALT: 14 U/L (ref 0–44)
AST: 27 U/L (ref 15–41)
Albumin: 3.7 g/dL (ref 3.5–5.0)
Alkaline Phosphatase: 59 U/L (ref 38–126)
Anion gap: 8 (ref 5–15)
BUN: 17 mg/dL (ref 8–23)
CO2: 25 mmol/L (ref 22–32)
Calcium: 9.2 mg/dL (ref 8.9–10.3)
Chloride: 106 mmol/L (ref 98–111)
Creatinine, Ser: 1.13 mg/dL (ref 0.61–1.24)
GFR, Estimated: >60 mL/min (ref >60)
Glucose, Bld: 117 mg/dL — ABNORMAL HIGH (ref 70–99)
Potassium: 4.8 mmol/L (ref 3.5–5.1)
Sodium: 139 mmol/L (ref 135–145)
Total Bilirubin: 0.8 mg/dL (ref 0.0–1.2)
Total Protein: 7.1 g/dL (ref 6.5–8.1)

## 2023-07-23 LAB — ETHANOL: Alcohol, Ethyl (B): <15 mg/dL (ref <15)

## 2023-07-23 LAB — GLUCOSE, CAPILLARY: Glucose-Capillary: 82 mg/dL (ref 70–99)

## 2023-07-23 LAB — APTT: aPTT: <22 s — ABNORMAL LOW (ref 24–36)

## 2023-07-23 MED ORDER — LORAZEPAM 2 MG/ML IJ SOLN
1.0000 mg | INTRAMUSCULAR | Status: AC | PRN
  Filled 2023-07-23: qty 2

## 2023-07-23 MED ORDER — SODIUM CHLORIDE 0.9 % IV SOLN
150.0000 mg | Freq: Two times a day (BID) | INTRAVENOUS | Status: DC
  Administered 2023-07-24 – 2023-07-25 (×4): 150 mg via INTRAVENOUS
  Filled 2023-07-23 (×4): qty 15

## 2023-07-23 MED ORDER — FOLIC ACID 1 MG PO TABS
1.0000 mg | ORAL_TABLET | Freq: Every day | ORAL | Status: DC
  Administered 2023-07-24 – 2023-08-04 (×12): 1 mg via ORAL
  Filled 2023-07-23 (×12): qty 1

## 2023-07-23 MED ORDER — LORAZEPAM 2 MG/ML IJ SOLN
0.0000 mg | Freq: Two times a day (BID) | INTRAMUSCULAR | Status: DC
  Administered 2023-07-27: 2 mg via INTRAVENOUS
  Filled 2023-07-23: qty 1

## 2023-07-23 MED ORDER — ADULT MULTIVITAMIN W/MINERALS CH
1.0000 | ORAL_TABLET | Freq: Every day | ORAL | Status: DC
  Administered 2023-07-24 – 2023-08-04 (×12): 1 via ORAL
  Filled 2023-07-23 (×12): qty 1

## 2023-07-23 MED ORDER — SODIUM CHLORIDE 0.9% FLUSH: 3.0000 mL | Freq: Once | INTRAVENOUS | Status: DC

## 2023-07-23 MED ORDER — LORAZEPAM 1 MG PO TABS
1.0000 mg | ORAL_TABLET | ORAL | Status: AC | PRN
  Administered 2023-07-26: 2 mg via ORAL
  Filled 2023-07-23: qty 2

## 2023-07-23 MED ORDER — LEVETIRACETAM (KEPPRA) 500 MG/5 ML ADULT IV PUSH
1500.0000 mg | Freq: Once | INTRAVENOUS | Status: AC
  Administered 2023-07-23: 1500 mg via INTRAVENOUS
  Filled 2023-07-23: qty 15

## 2023-07-23 MED ORDER — THIAMINE HCL 100 MG/ML IJ SOLN
100.0000 mg | Freq: Every day | INTRAMUSCULAR | Status: DC
  Filled 2023-07-23 (×2): qty 2

## 2023-07-23 MED ORDER — LORAZEPAM 2 MG/ML IJ SOLN: 0.0000 mg | Freq: Four times a day (QID) | INTRAMUSCULAR | Status: DC

## 2023-07-23 MED ORDER — LORAZEPAM 2 MG/ML IJ SOLN
2.0000 mg | INTRAMUSCULAR | Status: AC | PRN
  Administered 2023-07-23 – 2023-07-26 (×3): 2 mg via INTRAVENOUS
  Filled 2023-07-23 (×3): qty 1

## 2023-07-23 MED ORDER — LEVETIRACETAM (KEPPRA) 500 MG/5 ML ADULT IV PUSH
1500.0000 mg | Freq: Two times a day (BID) | INTRAVENOUS | Status: DC
  Administered 2023-07-24 – 2023-07-25 (×3): 1500 mg via INTRAVENOUS
  Filled 2023-07-23 (×3): qty 15

## 2023-07-23 MED ORDER — THIAMINE MONONITRATE 100 MG PO TABS
100.0000 mg | ORAL_TABLET | Freq: Every day | ORAL | Status: DC
  Administered 2023-07-24 – 2023-08-04 (×12): 100 mg via ORAL
  Filled 2023-07-23 (×12): qty 1

## 2023-07-23 NOTE — ED Notes (Signed)
 Pt transported to ct via strecher per CT [employer]

## 2023-07-23 NOTE — H&P (Signed)
 History and Physical    John Spears AVW:[id number] DOB: 22-Nov-1950 DOA: 07/23/2023  Referring MD/NP/PA:   PCP: Patient, No Pcp Per   Patient coming from:  The patient is coming from home.     Chief Complaint: tremor  HPI: John Spears is a 73 y.o. male with medical history significant of seizure, HTN, H LD, dCHF, stroke, A fib on Xarelto , BPH, who presents with tremor.  Per her sister (I called her sister by phone), pt has not been taking his Keppra  in the past two days due to dental procedure. Pt had 4 teeth pullout. His bottom gums are oozing blood. He still has oozing blood from dental surgery. Pt developed tremors this AM which often precede his seizure activity per her sister. Pt was seen in ED this AM, and had negative work up. Pt was discharged home and instructed to return to his normal Keppra  dosing. After went home, pt developed right hand shaking, right facial droop and became unresponsive per her sister. Pt was brought back to ED again.   When I saw patient in ED, patient is mildly confused.  He knows his own name, is orientated to place, but confused about year.  He moves all extremities, no facial droop or slurred speech on my examination.  Denies chest pain, cough, SOB.  No nausea, vomiting, diarrhea or abdominal pain.  No symptoms of UTI.  No fever or chills. Of  note, pt has Keppra  1500 mg twice daily and lacosamide  150 mg twice daily on his medication list, but per his sister, patient only takes Keppra  normally.  Addendum: after pt arrived to the floor, he started seizing. Pt was given 1500 mg of Keppra , 150 mg of Vimpt and total of 6 mg of Ativan  by IV, still seizing. Suspecting status epilepticus. I ordered another 1500 mg of Keppra  to be given. Consulted ICU NP, Ouma. Pt will be transferred to SDU.    Data reviewed independently and ED Course: pt was found to have WBC 6.0, GFR > 60, temperature normal, blood pressure 167/68, heart rate 75, RR 20 oxygen  saturation 98% on room air.  CT of head is negative acute intracranial abnormalities, but showed a remote infarction.  Patient is placed in the PCU for observation.   EKG: I have personally reviewed.  Sinus rhythm, QTc 451, PVC, poor R wave progression, bifascicular block   Review of Systems:   General: no fevers, chills, no body weight gain, fatigue HEENT: no blurry vision, hearing changes or sore throat Respiratory: no dyspnea, coughing, wheezing CV: no chest pain, no palpitations GI: no nausea, vomiting, abdominal pain, diarrhea, constipation GU: no dysuria, burning on urination, increased urinary frequency, hematuria  Ext: no leg edema Neuro: has tremor, right hand shaking, right facial droop, unresponsiveness Skin: no rash, no skin tear. MSK: No muscle spasm, no deformity, no limitation of range of movement in spin Heme: No easy bruising.  Travel history: No recent long distant travel.   Allergy: No Known Allergies  Past Medical History:  Diagnosis Date   Alcohol abuse    Heart failure with improved ejection fraction (HFimpEF) (HCC) 02/2022   a. 02/2019 Echo: EF 60-65%, GrI DD; b. 01/2022 Echo: EF 30-35%; c. 06/2022 Echo: EF 60-65%, mild LVH, RI DD, nl RV fxn, triv MR.   Hypertension    Seizure (HCC)    Seizure disorder [facility])     Past Surgical History:  Procedure Laterality Date   JOINT REPLACEMENT  Social History:  reports that he has quit smoking. He has never used smokeless tobacco. He reports that he does not currently use alcohol after a past usage of about 84.0 standard drinks of alcohol per week. He reports that he does not currently use drugs after having used the following drugs: Marijuana.  Family History:  Family History  Problem Relation Age of Onset   Heart attack Mother 50     Prior to Admission medications   Medication Sig Start Date End Date Taking? Authorizing Provider  acetaminophen  (TYLENOL ) 500 MG tablet Take 1,000 mg by mouth every 6 (six)  hours as needed for moderate pain (pain score 4-6).    [provider]  amLODipine  (NORVASC ) 10 MG tablet Take 10 mg by mouth at bedtime. 01/13/23   [provider]  Aspirin -Salicylamide-Caffeine (ARTHRITIS STRENGTH BC POWDER PO) Take 1 Package by mouth every 6 (six) hours as needed (Pain).    [provider]  atorvastatin  (LIPITOR) 40 MG tablet Take 40 mg by mouth at bedtime.    [provider]  carvedilol  (COREG ) 12.5 MG tablet Take 1 tablet (12.5 mg total) by mouth 2 (two) times daily. 07/05/23 07/04/24  Ronald Cockayne, NP  cyanocobalamin  (VITAMIN B12) 1000 MCG tablet Take 1,000 mcg by mouth in the morning. 11/24/22   [provider]  feeding supplement (ENSURE ENLIVE / ENSURE PLUS) LIQD Take 237 mLs by mouth 2 (two) times daily between meals. 12/30/22   Verlyn Goad, MD  folic acid  (FOLVITE ) 1 MG tablet Take 1 mg by mouth in the morning. 07/16/22   [provider]  furosemide  (LASIX ) 20 MG tablet TAKE 1 TABLET BY MOUTH ONCE A DAY 11/09/22   Florette Hurry, NP  lacosamide  150 MG TABS Take 1 tablet (150 mg total) by mouth 2 (two) times daily. 03/16/23   Vada Garibaldi, MD  levETIRAcetam  (KEPPRA ) 750 MG tablet Take 2 tablets (1,500 mg total) by mouth 2 (two) times daily. 03/11/23 05/17/23  Vada Garibaldi, MD  lidocaine  (LIDODERM ) 5 % Place 1 patch onto the skin every 12 (twelve) hours. Remove & Discard patch within 12 hours or as directed by MD 06/12/23 06/11/24  Twilla Galea, MD  magnesium  oxide (MAG-OX) 400 MG tablet Take 400 mg by mouth daily. 02/01/23   [provider]  Multiple Vitamin (MULTIVITAMIN WITH MINERALS) TABS tablet Take 1 tablet by mouth daily. Patient taking differently: Take 1 tablet by mouth every evening. 12/31/22   Verlyn Goad, MD  oxyCODONE -acetaminophen  (PERCOCET) 5-325 MG tablet Take 1 tablet by mouth every 4 (four) hours as needed. 06/12/23 06/11/24  Twilla Galea, MD  rivaroxaban  (XARELTO ) 20 MG TABS  tablet Take 1 tablet (20 mg total) by mouth daily with supper. 05/17/23   Ronald Cockayne, NP  tamsulosin  (FLOMAX ) 0.4 MG CAPS capsule Take 0.4 mg by mouth at bedtime.    [provider]  thiamine  (VITAMIN B1) 100 MG tablet Take 100 mg by mouth in the morning. 07/16/22   [provider]    Physical Exam: Vitals:   07/23/23 1830 07/23/23 1835 07/23/23 1952 07/23/23 2332  BP:  (!) 153/73 (!) 167/68 (!) 158/66  Pulse:   70 (!) 55  Resp:   20 18  Temp:   98.6 F (37 C)   TempSrc:      SpO2:   98% 100%  Weight: 77.3 kg     Height: 5\' 11"  (1.803 m)      General: Not in acute distress HEENT: His  bottom gums are oozing blood.       Eyes: PERRL, EOMI, no jaundice       ENT: No discharge from the ears and nose, no pharynx injection, no tonsillar enlargement.        Neck: No JVD, no bruit, no mass felt. Heme: No neck lymph node enlargement. Cardiac: S1/S2, RRR, No murmurs, No gallops or rubs. Respiratory: No rales, wheezing, rhonchi or rubs. GI: Soft, nondistended, nontender, no rebound pain, no organomegaly, BS present. GU: No hematuria Ext: No pitting leg edema bilaterally. 1+DP/PT pulse bilaterally. Musculoskeletal: No joint deformities, No joint redness or warmth, no limitation of ROM in spin. Skin: No rashes.  Neuro: Mildly confused, orientated to place, knows his own name, confused about year, cranial nerves II-XII grossly intact, moves all extremities. Psych: Patient is not psychotic, no suicidal or hemocidal ideation.  Labs on Admission: I have personally reviewed following labs and imaging studies  CBC: Recent Labs  Lab 07/23/23 1059 07/23/23 1836  WBC 5.6 6.0  NEUTROABS  --  3.4  HGB 10.9* 10.0*  HCT 35.1* 30.6*  MCV 93.4 89.2  PLT 150 167   Basic Metabolic Panel: Recent Labs  Lab 07/23/23 1059 07/23/23 1836  NA 140 139  K 4.8 4.8  CL 107 106  CO2 22 25  GLUCOSE 86 117*  BUN 17 17  CREATININE 1.01 1.13  CALCIUM  9.3 9.2   GFR: Estimated  Creatinine Clearance: 63.9 mL/min (by C-G formula based on SCr of 1.13 mg/dL). Liver Function Tests: Recent Labs  Lab 07/23/23 1836  AST 27  ALT 14  ALKPHOS 59  BILITOT 0.8  PROT 7.1  ALBUMIN 3.7   No results for input(s): "LIPASE", "AMYLASE" in the last 168 hours. No results for input(s): "AMMONIA" in the last 168 hours. Coagulation Profile: Recent Labs  Lab 07/23/23 1959  INR 1.2   Cardiac Enzymes: No results for input(s): "CKTOTAL", "CKMB", "CKMBINDEX", "TROPONINI" in the last 168 hours. BNP (last 3 results) No results for input(s): "PROBNP" in the last 8760 hours. HbA1C: No results for input(s): "HGBA1C" in the last 72 hours. CBG: No results for input(s): "GLUCAP" in the last 168 hours. Lipid Profile: No results for input(s): "CHOL", "HDL", "LDLCALC", "TRIG", "CHOLHDL", "LDLDIRECT" in the last 72 hours. Thyroid Function Tests: No results for input(s): "TSH", "T4TOTAL", "FREET4", "T3FREE", "THYROIDAB" in the last 72 hours. Anemia Panel: No results for input(s): "VITAMINB12", "FOLATE", "FERRITIN", "TIBC", "IRON ", "RETICCTPCT" in the last 72 hours. Urine analysis:    Component Value Date/Time   COLORURINE YELLOW (A) 06/12/2023 1404   APPEARANCEUR CLEAR (A) 06/12/2023 1404   LABSPEC 1.019 06/12/2023 1404   PHURINE 5.0 06/12/2023 1404   GLUCOSEU NEGATIVE 06/12/2023 1404   HGBUR NEGATIVE 06/12/2023 1404   BILIRUBINUR NEGATIVE 06/12/2023 1404   KETONESUR NEGATIVE 06/12/2023 1404   PROTEINUR NEGATIVE 06/12/2023 1404   NITRITE NEGATIVE 06/12/2023 1404   LEUKOCYTESUR NEGATIVE 06/12/2023 1404   Sepsis Labs: @LABRCNTIP (procalcitonin:4,lacticidven:4) )No results found for this or any previous visit (from the past 240 hours).   Radiological Exams on Admission:   Assessment/Plan Principal Problem:   Seizure (HCC) Active Problems:   History of CVA (cerebrovascular accident)   Paroxysmal atrial fibrillation (HCC)   Essential hypertension   HLD (hyperlipidemia)    Chronic diastolic CHF (congestive heart failure) (HCC)   BPH (benign prostatic hyperplasia)   Alcohol abuse   Assessment and Plan:  Seizure [facility]): After pt arrived to the floor, he started seizing. Pt was given 1500 mg of Keppra ,  150 mg of Vimpt and total of 6 mg of Ativan  by IV, still seizing. Suspecting status epilepticus. I ordered another 1500 mg of Keppra  to be given. Consulted ICU NP, Ouma.  Patient has history of alcohol abuse, did not drink in the past 4 days, alcohol withdrawal seizure is also possible.  - Pt will be transferred to SDU.  - Seizure precaution - As needed Ativan  for seizure - Continue Keppra  1500 mg twice daily - Continue Vimpat  50 mg twice daily - As needed Ativan  for seizure - Frequent neurocheck  History of CVA (cerebrovascular accident) -lipitor  Paroxysmal atrial fibrillation (HCC) -coreg  -hold Xarelto  due to gum bleeding  Essential hypertension -IV hydralazine  as needed - Amlodipine , Coreg   HLD (hyperlipidemia) -Lipitor  Chronic diastolic CHF (congestive heart failure) (HCC): 2D echo on 06/24/2022 showed EF 60 to 65% with grade 1 diastolic dysfunction.  Patient does not have leg edema or JVD.  Patient is clinically dry on examination. -Hold Lasix   BPH (benign prostatic hyperplasia) -Flomax   Alcohol abuse - CIWA protocol --> patient may need phenobarbital  (will follow-up ICU physician recommendation)      DVT ppx: SCD  Code Status: Full code     Family Communication:  yes, patient's sister by phone    Disposition Plan:  Anticipate discharge back to previous environment  Consults called:  none  Admission status and Level of care: Progressive:    for obs      Dispo: The patient is from: Home              Anticipated d/c is to: Home              Anticipated d/c date is: 1 day              Patient currently is not medically stable to d/c.    Severity of Illness:  The appropriate patient status for this patient is OBSERVATION.  Observation status is judged to be reasonable and necessary in order to provide the required intensity of service to ensure the patient's safety. The patient's presenting symptoms, physical exam findings, and initial radiographic and laboratory data in the context of their medical condition is felt to place them at decreased risk for further clinical deterioration. Furthermore, it is anticipated that the patient will be medically stable for discharge from the hospital within 2 midnights of admission.        Date of Service 07/23/2023    Fidencio Hue [facility]   If 7PM-7AM, please contact night-coverage [email or web address] 07/23/2023, 11:40 PM

## 2023-07-23 NOTE — ED Notes (Signed)
 Patient has tremors when arms are extended. Bottom gums are oozing blood. Patient has gauze in place and was given new gauze. Dr. Alejo Amsler aware and agreed that the gauze would be safe.

## 2023-07-23 NOTE — ED Triage Notes (Addendum)
 pt in from home. Pt here this morning and was d/c. LKW at 1000am this morning. LVO of 2 with EMS. Facial droop present. Per family hx of seizures. Has not had his Keppra . Tremors prelude seizure per family. Pt just had dental surgery. Blood present in mouth. Airway intact.  150/70 BS-117 HR-80

## 2023-07-23 NOTE — Discharge Instructions (Addendum)
 Please restart taking your Keppra  on time and as prescribed

## 2023-07-23 NOTE — ED Provider Notes (Signed)
 [facility] Provider Note    Event Date/Time   First MD Initiated Contact with Patient 07/23/23 1827     (approximate)   History   Altered Mental Status  pt in from home. Pt here this morning and was d/c. LKW at 1000am this morning. LVO of 2 with EMS. Facial droop present. Per family hx of seizures. Has not had his Keppra . Tremors prelude seizure per family. Pt just had dental surgery. Blood present in mouth. Airway intact.  150/70 BS-117 HR-80   HPI Cabell Lazenby is a 73 y.o. male PMH epilepsy, CVA, heart failure, hypertension, A-fib on Xarelto  presents for evaluation of altered mental status - Patient is a limited historian.  Tells me that he believes he may have had a seizure but is otherwise unsure why he is at the hospital.  No complaints currently.  Had 4 teeth removed couple days ago.  Unclear if patient may have had facial droop with EMS.  Per chart review, patient was seen in our emergency department earlier today complaining of intermittent tremors.  Had been holding Keppra  prior to a dental procedure.  Workup unremarkable, discharged home with plan to continue his Keppra .  Last admitted in January of this year     Physical Exam   Triage Vital Signs: BP (!) 158/66 (BP Location: Right Arm)   Pulse (!) 55   Temp 98.6 F (37 C)   Resp 18   Ht 5\' 11"  (1.803 m)   Wt 77.3 kg   SpO2 100%   BMI 23.77 kg/m    Most recent vital signs: Vitals:   07/23/23 1952 07/23/23 2332  BP: (!) 167/68 (!) 158/66  Pulse: 70 (!) 55  Resp: 20 18  Temp: 98.6 F (37 C)   SpO2: 98% 100%     General: Awake, no distress.  CV:  Good peripheral perfusion. RRR, RP 2+ Resp:  Normal effort. CTAB Abd:  No distention. Nontender to deep palpation throughout Neuro:  Aox4, CN II-XII intact, FNF wnl, finger taps fast b/l, 5/5 strength in bilateral finger extension/grip, arm flexion/extension, EHL/FHL. BUE AG 10+ sec no drift, BLE AG 5+ sec no drift.  Gait  testing deferred SILT.   1a  Level of consciousness: 0=alert; keenly responsive  1b. LOC questions:  0=Performs both tasks correctly  1c. LOC commands: 0=Performs both tasks correctly  2.  Best Gaze: 0=normal  3.  Visual: 0=No visual loss  4. Facial Palsy: 0=Normal symmetric movement  5a.  Motor left arm: 0=No drift, limb holds 90 (or 45) degrees for full 10 seconds  5b.  Motor right arm: 0=No drift, limb holds 90 (or 45) degrees for full 10 seconds  6a. motor left leg: 0=No drift, limb holds 90 (or 45) degrees for full 10 seconds  6b  Motor right leg:  0=No drift, limb holds 90 (or 45) degrees for full 10 seconds  7. Limb Ataxia: 0=Absent  8.  Sensory: 0=Normal; no sensory loss  9. Best Language:  0=No aphasia, normal  10. Dysarthria: 0=Normal  11. Extinction and Inattention: 0=No abnormality  12. Distal motor function: 0=Normal   Total:   0       ED Results / Procedures / Treatments   Labs (all labs ordered are listed, but only abnormal results are displayed) Labs Reviewed  CBC - Abnormal; Notable for the following components:      Result Value   RBC 3.43 (*)    Hemoglobin 10.0 (*)  HCT 30.6 (*)    RDW 15.9 (*)    All other components within normal limits  DIFFERENTIAL - Abnormal; Notable for the following components:   Monocytes Absolute 1.1 (*)    All other components within normal limits  COMPREHENSIVE METABOLIC PANEL WITH GFR - Abnormal; Notable for the following components:   Glucose, Bld 117 (*)    All other components within normal limits  PROTIME-INR - Abnormal; Notable for the following components:   Prothrombin Time 15.8 (*)    All other components within normal limits  APTT - Abnormal; Notable for the following components:   aPTT <22 (*)    All other components within normal limits  ETHANOL  BASIC METABOLIC PANEL WITH GFR  CBC  CBG MONITORING, ED     EKG  See ED course below   RADIOLOGY Radiology interpreted by myself and radiology reports  reviewed.    PROCEDURES:  Critical Care performed: No  Procedures   MEDICATIONS ORDERED IN ED: Medications  levETIRAcetam  (KEPPRA ) undiluted injection 1,500 mg (has no administration in time range)  LORazepam  (ATIVAN ) injection 2 mg (has no administration in time range)     IMPRESSION / MDM / ASSESSMENT AND PLAN / ED COURSE  I reviewed the triage vital signs and the nursing notes.                              DDX/MDM/AP: Differential diagnosis includes, but is not limited to, possible seizure/postictal state that has resolved (per chart review, appears patient often gets right facial droop after seizures), consider CVA/TIA though no obvious findings on my exam.  Consider electrolyte abnormality.  Patient is not altered on my eval.  Plan: - Labs - EKG - CT head - Will attempt to gather collateral from family  Patient's presentation is most consistent with acute presentation with potential threat to life or bodily function.  The patient is on the cardiac monitor to evaluate for evidence of arrhythmia and/or significant heart rate changes.  ED course below.  ED workup unremarkable including CT head.  I was eventually able to get collateral from the patient's sister, appears as if he had at least 2 episodes of rhythmic hand shaking being minimally responsive/nonverbal throughout the day today.  Clarifies that he actually has been taking his Keppra  as prescribed.  Does reiterate that she felt he had right hand shaking as well as right facial droop during his episode this afternoon which was reportedly much worse than the one in the morning.  Overall, suspect likely recrudescence of prior stroke and seizure-like episodes, unclear why he is having increased frequency of seizures but no clear evidence of underlying pathology at this time. Consider TIA, doubt underlying CVA at this time. Nonfocal neuro exam at this time.  Admitted to hospital service for ongoing eval and management.    Clinical Course as of 07/23/23 2333  Fri Jul 23, 2023  1859 Attempted to call pt's sister, betty slade. No answer [MM]  1900 CBC unremarkable [MM]  1900 Ecg = sinus rhythm, rate 68, no ST elevation or depression, no significant repolarization abnormality, left axis deviation.  First-degree AV block.  No evidence of ischemia nor arrhythmia on my read. [MM]  2107 CTH: IMPRESSION: 1. No evidence of acute intracranial hemorrhage or infarct. 2. Stable remote left parietal cortical infarct. 3. Stable mild periventricular white matter changes likely reflecting the sequela of small vessel ischemia.   [MM]  2212 Called  sister again, answered: - Patient apparently had 2 episodes of tremors, difficulty comprehending today.  Episode this morning was more minor, brought to ED, discharged home - got home, took his medications, she ran an errand, then when she returned he was nonresponsive, R hand was shaking, mild right facial droop - usually like this when he has a seizure - Was minimally interactive with her [MM]    Clinical Course User Index [MM] Collis Deaner, MD     FINAL CLINICAL IMPRESSION(S) / ED DIAGNOSES   Final diagnoses:  Transient alteration of awareness  Seizure-like activity (HCC)     Rx / DC Orders   ED Discharge Orders     None        Note:  This document was prepared using Dragon voice recognition software and may include unintentional dictation errors.   Collis Deaner, MD 07/23/23 [phone]

## 2023-07-23 NOTE — ED Triage Notes (Signed)
 Refer to first nurse note. Pt to ED via [facility]. Pt had five teeth pulled yesterday. Gums swollen and still oozing some blood.

## 2023-07-23 NOTE — ED Triage Notes (Signed)
 First Nurse Note: Patient to ED via [facility] from home for tremors. Hx of seizures. Has not had keppra  in 2 days due to dental surgery. States he normally has tremors before his seizures.  160/100 90 cbg 85 HR

## 2023-07-23 NOTE — ED Provider Notes (Signed)
 [facility] Provider Note   Event Date/Time   First MD Initiated Contact with Patient 07/23/23 1142     (approximate) History  Tremors  HPI John Spears is a 73 y.o. male with a past medical history of epilepsy, CVA, heart failure, hypertension, paroxysmal atrial fibrillation on Xarelto  who presents complaining of tremors after being off of his Keppra  due to a dental procedure yesterday.  Patient states that he has not been taking his Keppra  because his sister who normally gets his medications has not been giving it to him.  Patient states that he just refilled his Keppra  and therefore has enough medication but was instructed to not take it.  Patient denies any seizure activity however states that his tremors often precede his seizure activity ROS: Patient currently denies any vision changes, tinnitus, difficulty speaking, facial droop, sore throat, chest pain, shortness of breath, abdominal pain, nausea/vomiting/diarrhea, dysuria, or weakness/numbness/paresthesias in any extremity   Physical Exam  Triage Vital Signs: ED Triage Vitals  Encounter Vitals Group     BP 07/23/23 1055 (!) 142/77     Systolic BP Percentile --      Diastolic BP Percentile --      Pulse Rate 07/23/23 1055 75     Resp 07/23/23 1055 18     Temp 07/23/23 1055 97.8 F (36.6 C)     Temp Source 07/23/23 1055 Axillary     SpO2 07/23/23 1055 100 %     Weight 07/23/23 1056 194 lb 0.1 oz (88 kg)     Height 07/23/23 1056 5\' 11"  (1.803 m)     Head Circumference --      Peak Flow --      Pain Score 07/23/23 1056 0     Pain Loc --      Pain Education --      Exclude from Growth Chart --    Most recent vital signs: Vitals:   07/23/23 1055  BP: (!) 142/77  Pulse: 75  Resp: 18  Temp: 97.8 F (36.6 C)  SpO2: 100%   General: Awake, oriented x4. CV:  Good peripheral perfusion. Resp:  Normal effort. Abd:  No distention. Other:  Elderly overweight African-American male resting  comfortably in no acute distress.  Gauze in mouth saturated with saliva and blood from previous dental surgery ED Results / Procedures / Treatments  Labs (all labs ordered are listed, but only abnormal results are displayed) Labs Reviewed  CBC - Abnormal; Notable for the following components:      Result Value   RBC 3.76 (*)    Hemoglobin 10.9 (*)    HCT 35.1 (*)    RDW 16.0 (*)    All other components within normal limits  BASIC METABOLIC PANEL WITH GFR   PROCEDURES: Critical Care performed: No Procedures MEDICATIONS ORDERED IN ED: Medications - No data to display IMPRESSION / MDM / ASSESSMENT AND PLAN / ED COURSE  I reviewed the triage vital signs and the nursing notes.                             The patient is on the cardiac monitor to evaluate for evidence of arrhythmia and/or significant heart rate changes. Patient's presentation is most consistent with acute presentation with potential threat to life or bodily function. Patient presents after recent tremors.  No immunosuppresion hx and had no preceding fever. No history of alcohol abuse or suspicion for toxin  ingestion. Unlikely stroke, syncope. Unlikely infectious etiology. No preceding trauma.  Workup: CBC, BMP  No evidence of acute abnormalities.  Patient instructed to return to his normal Keppra  dosing Disposition: Discharge home with primary care follow up in next 24-48 hours.   FINAL CLINICAL IMPRESSION(S) / ED DIAGNOSES   Final diagnoses:  Occasional tremors  Nonadherence to medication   Rx / DC Orders   ED Discharge Orders     None      Note:  This document was prepared using Dragon voice recognition software and may include unintentional dictation errors.   Alphonzo Devera K, MD 07/23/23 [id number]

## 2023-07-24 ENCOUNTER — Observation Stay

## 2023-07-24 DIAGNOSIS — Z87891 Personal history of nicotine dependence: Secondary | ICD-10-CM | POA: Diagnosis not present

## 2023-07-24 DIAGNOSIS — Z8673 Personal history of transient ischemic attack (TIA), and cerebral infarction without residual deficits: Secondary | ICD-10-CM | POA: Diagnosis not present

## 2023-07-24 DIAGNOSIS — I5032 Chronic diastolic (congestive) heart failure: Secondary | ICD-10-CM | POA: Diagnosis present

## 2023-07-24 DIAGNOSIS — G8384 Todd's paralysis (postepileptic): Secondary | ICD-10-CM | POA: Diagnosis present

## 2023-07-24 DIAGNOSIS — K068 Other specified disorders of gingiva and edentulous alveolar ridge: Secondary | ICD-10-CM | POA: Diagnosis present

## 2023-07-24 DIAGNOSIS — Z820 Family history of epilepsy and other diseases of the nervous system: Secondary | ICD-10-CM | POA: Diagnosis not present

## 2023-07-24 DIAGNOSIS — E785 Hyperlipidemia, unspecified: Secondary | ICD-10-CM | POA: Diagnosis not present

## 2023-07-24 DIAGNOSIS — R7881 Bacteremia: Secondary | ICD-10-CM | POA: Diagnosis not present

## 2023-07-24 DIAGNOSIS — I452 Bifascicular block: Secondary | ICD-10-CM | POA: Diagnosis present

## 2023-07-24 DIAGNOSIS — T426X6A Underdosing of other antiepileptic and sedative-hypnotic drugs, initial encounter: Secondary | ICD-10-CM | POA: Diagnosis present

## 2023-07-24 DIAGNOSIS — Z79899 Other long term (current) drug therapy: Secondary | ICD-10-CM | POA: Diagnosis not present

## 2023-07-24 DIAGNOSIS — N400 Enlarged prostate without lower urinary tract symptoms: Secondary | ICD-10-CM | POA: Diagnosis present

## 2023-07-24 DIAGNOSIS — G40901 Epilepsy, unspecified, not intractable, with status epilepticus: Secondary | ICD-10-CM | POA: Diagnosis present

## 2023-07-24 DIAGNOSIS — G928 Other toxic encephalopathy: Secondary | ICD-10-CM | POA: Diagnosis present

## 2023-07-24 DIAGNOSIS — R569 Unspecified convulsions: Secondary | ICD-10-CM

## 2023-07-24 DIAGNOSIS — Z96641 Presence of right artificial hip joint: Secondary | ICD-10-CM | POA: Diagnosis present

## 2023-07-24 DIAGNOSIS — I110 Hypertensive heart disease with heart failure: Secondary | ICD-10-CM | POA: Diagnosis present

## 2023-07-24 DIAGNOSIS — Z91128 Patient's intentional underdosing of medication regimen for other reason: Secondary | ICD-10-CM | POA: Diagnosis not present

## 2023-07-24 DIAGNOSIS — N401 Enlarged prostate with lower urinary tract symptoms: Secondary | ICD-10-CM | POA: Diagnosis not present

## 2023-07-24 DIAGNOSIS — E876 Hypokalemia: Secondary | ICD-10-CM | POA: Diagnosis not present

## 2023-07-24 DIAGNOSIS — Z8249 Family history of ischemic heart disease and other diseases of the circulatory system: Secondary | ICD-10-CM | POA: Diagnosis not present

## 2023-07-24 DIAGNOSIS — Z91148 Patient's other noncompliance with medication regimen for other reason: Secondary | ICD-10-CM

## 2023-07-24 DIAGNOSIS — Z7901 Long term (current) use of anticoagulants: Secondary | ICD-10-CM | POA: Diagnosis not present

## 2023-07-24 DIAGNOSIS — D649 Anemia, unspecified: Secondary | ICD-10-CM | POA: Diagnosis present

## 2023-07-24 DIAGNOSIS — F1010 Alcohol abuse, uncomplicated: Secondary | ICD-10-CM | POA: Diagnosis present

## 2023-07-24 DIAGNOSIS — I480 Paroxysmal atrial fibrillation: Secondary | ICD-10-CM | POA: Diagnosis present

## 2023-07-24 DIAGNOSIS — Z7982 Long term (current) use of aspirin: Secondary | ICD-10-CM | POA: Diagnosis not present

## 2023-07-24 DIAGNOSIS — A4151 Sepsis due to Escherichia coli [E. coli]: Secondary | ICD-10-CM | POA: Diagnosis not present

## 2023-07-24 LAB — URINE DRUG SCREEN, QUALITATIVE (ARMC ONLY)
Amphetamines, Ur Screen: NOT DETECTED
Barbiturates, Ur Screen: NOT DETECTED
Benzodiazepine, Ur Scrn: NOT DETECTED
Cannabinoid 50 Ng, Ur ~~LOC~~: NOT DETECTED
Cocaine Metabolite,Ur ~~LOC~~: NOT DETECTED
MDMA (Ecstasy)Ur Screen: NOT DETECTED
Methadone Scn, Ur: NOT DETECTED
Opiate, Ur Screen: POSITIVE — AB
Phencyclidine (PCP) Ur S: NOT DETECTED
Tricyclic, Ur Screen: NOT DETECTED

## 2023-07-24 LAB — CBC
HCT: 28.7 % — ABNORMAL LOW (ref 39.0–52.0)
Hemoglobin: 9.2 g/dL — ABNORMAL LOW (ref 13.0–17.0)
MCH: 28.9 pg (ref 26.0–34.0)
MCHC: 32.1 g/dL (ref 30.0–36.0)
MCV: 90.3 fL (ref 80.0–100.0)
Platelets: 151 10*3/uL (ref 150–400)
RBC: 3.18 MIL/uL — ABNORMAL LOW (ref 4.22–5.81)
RDW: 15.5 % (ref 11.5–15.5)
WBC: 6.1 10*3/uL (ref 4.0–10.5)
nRBC: 0 % (ref 0.0–0.2)

## 2023-07-24 LAB — BASIC METABOLIC PANEL WITH GFR
Anion gap: 6 (ref 5–15)
BUN: 14 mg/dL (ref 8–23)
CO2: 27 mmol/L (ref 22–32)
Calcium: 9 mg/dL (ref 8.9–10.3)
Chloride: 105 mmol/L (ref 98–111)
Creatinine, Ser: 0.94 mg/dL (ref 0.61–1.24)
GFR, Estimated: >60 mL/min (ref >60)
Glucose, Bld: 94 mg/dL (ref 70–99)
Potassium: 3.1 mmol/L — ABNORMAL LOW (ref 3.5–5.1)
Sodium: 138 mmol/L (ref 135–145)

## 2023-07-24 LAB — URINALYSIS, COMPLETE (UACMP) WITH MICROSCOPIC
Bacteria, UA: NONE SEEN
Bilirubin Urine: NEGATIVE
Glucose, UA: NEGATIVE mg/dL
Hgb urine dipstick: NEGATIVE
Ketones, ur: NEGATIVE mg/dL
Leukocytes,Ua: NEGATIVE
Nitrite: NEGATIVE
Protein, ur: NEGATIVE mg/dL
Specific Gravity, Urine: 1.02 (ref 1.005–1.030)
Squamous Epithelial / HPF: 0 /HPF (ref 0–5)
pH: 6 (ref 5.0–8.0)

## 2023-07-24 LAB — MRSA NEXT GEN BY PCR, NASAL: MRSA by PCR Next Gen: NOT DETECTED

## 2023-07-24 LAB — GLUCOSE, CAPILLARY: Glucose-Capillary: 104 mg/dL — ABNORMAL HIGH (ref 70–99)

## 2023-07-24 MED ORDER — ASPIRIN 81 MG PO TBEC
81.0000 mg | DELAYED_RELEASE_TABLET | Freq: Every day | ORAL | Status: DC
  Administered 2023-07-24 – 2023-08-04 (×12): 81 mg via ORAL
  Filled 2023-07-24 (×12): qty 1

## 2023-07-24 MED ORDER — CARVEDILOL 6.25 MG PO TABS
12.5000 mg | ORAL_TABLET | Freq: Two times a day (BID) | ORAL | Status: DC
  Administered 2023-07-24 – 2023-08-04 (×22): 12.5 mg via ORAL
  Filled 2023-07-24: qty 1
  Filled 2023-07-24: qty 2
  Filled 2023-07-24 (×10): qty 1
  Filled 2023-07-24: qty 2
  Filled 2023-07-24 (×3): qty 1
  Filled 2023-07-24: qty 2
  Filled 2023-07-24 (×5): qty 1

## 2023-07-24 MED ORDER — POTASSIUM CHLORIDE 10 MEQ/100ML IV SOLN
10.0000 meq | INTRAVENOUS | Status: AC
  Administered 2023-07-24 (×4): 10 meq via INTRAVENOUS
  Filled 2023-07-24 (×4): qty 100

## 2023-07-24 MED ORDER — LEVETIRACETAM (KEPPRA) 500 MG/5 ML ADULT IV PUSH
1500.0000 mg | Freq: Once | INTRAVENOUS | Status: AC
  Administered 2023-07-24: 1500 mg via INTRAVENOUS
  Filled 2023-07-24: qty 15

## 2023-07-24 MED ORDER — ATORVASTATIN CALCIUM 20 MG PO TABS
40.0000 mg | ORAL_TABLET | Freq: Every day | ORAL | Status: DC
  Administered 2023-07-24 – 2023-08-03 (×11): 40 mg via ORAL
  Filled 2023-07-24 (×11): qty 2

## 2023-07-24 MED ORDER — TAMSULOSIN HCL 0.4 MG PO CAPS
0.4000 mg | ORAL_CAPSULE | Freq: Every day | ORAL | Status: DC
  Administered 2023-07-24 – 2023-08-03 (×11): 0.4 mg via ORAL
  Filled 2023-07-24 (×11): qty 1

## 2023-07-24 MED ORDER — VITAMIN B-12 1000 MCG PO TABS
1000.0000 ug | ORAL_TABLET | Freq: Every day | ORAL | Status: DC
  Administered 2023-07-24 – 2023-08-04 (×12): 1000 ug via ORAL
  Filled 2023-07-24 (×12): qty 1

## 2023-07-24 MED ORDER — LORAZEPAM 2 MG/ML IJ SOLN
2.0000 mg | Freq: Once | INTRAMUSCULAR | Status: AC
  Administered 2023-07-24: 2 mg via INTRAVENOUS

## 2023-07-24 MED ORDER — GABAPENTIN 100 MG PO CAPS
100.0000 mg | ORAL_CAPSULE | Freq: Two times a day (BID) | ORAL | Status: DC
  Administered 2023-07-24 – 2023-08-04 (×23): 100 mg via ORAL
  Filled 2023-07-24 (×23): qty 1

## 2023-07-24 MED ORDER — OXYCODONE-ACETAMINOPHEN 5-325 MG PO TABS
1.0000 | ORAL_TABLET | ORAL | Status: DC | PRN
  Administered 2023-07-25 – 2023-07-31 (×5): 1 via ORAL
  Filled 2023-07-24 (×5): qty 1

## 2023-07-24 MED ORDER — CHLORHEXIDINE GLUCONATE CLOTH 2 % EX PADS
6.0000 | MEDICATED_PAD | Freq: Every day | CUTANEOUS | Status: DC
  Administered 2023-07-24: 6 via TOPICAL

## 2023-07-24 MED ORDER — AMLODIPINE BESYLATE 10 MG PO TABS
10.0000 mg | ORAL_TABLET | Freq: Every day | ORAL | Status: DC
  Administered 2023-07-24 – 2023-08-03 (×11): 10 mg via ORAL
  Filled 2023-07-24 (×11): qty 1

## 2023-07-24 NOTE — Procedures (Addendum)
 Patient Name: John Spears  MRN: [id number]  Epilepsy Attending: Arleene Lack  Referring Physician/Provider: Gideon Kussmaul, NP  Duration: 07/24/2023 0211 to 07/24/2023 6606  Patient history: 73 y.o. male with medical history significant of seizure, HTN, H LD, dCHF, stroke, A fib on Xarelto , BPH, who presents with tremor. EEG to evaluate for seizure  Level of alertness: asleep/ lethargic  AEDs during EEG study: LEV, LCM, GBP, Ativan   Technical aspects: This EEG was obtained using a 10 lead EEG system positioned circumferentially without any parasagittal coverage (rapid EEG). Computer selected EEG is reviewed as  well as background features and all clinically significant events.  Description: Sleep was characterized by sleep spindles (12-14hz ), maximal fronto-central region.  EEG showed continuous generalized and lateralized left hemisphere 3 to 6 Hz theta-delta slowing with overriding 15 to 18 Hz beta activity distributed symmetrically and diffusely. Hyperventilation and photic stimulation were not performed.    Of note, parts of study were technically difficult due to significant electrode artifact.      ABNORMALITY - Continuous slow, generalized and lateralized left hemisphere   IMPRESSION: This limited ceribell study is suggestive of cortical dysfunction arising from left hemisphere likely secondary to underlying structural abnormality, post-ictal state. Additionally there is moderate to severe diffuse encephalopathy. No seizures or epileptiform discharges were seen throughout the recording.   [name]

## 2023-07-24 NOTE — Care Management Obs Status (Signed)
 MEDICARE OBSERVATION STATUS NOTIFICATION   Patient Details  Name: John Spears MRN: [id number] Date of Birth: Jan 29, 1951   Medicare Observation Status Notification Given:  Yes    Crayton Docker, RN 07/24/2023, 2:57 PM

## 2023-07-24 NOTE — Progress Notes (Signed)
 Dr. Arora gave order to discontinue NIH stroke scale order.

## 2023-07-24 NOTE — Progress Notes (Signed)
 Patient arrived at Room 247, vitals taken, Aox4, attached to tele box, patient started seizure at 1141 hooked to o2 at 3lpm, ativan  2 mg given, Cornelius Dill NP and Dr. Rosalea Collin MD informed.   5/31 1:10am patient started intermittent jerking of upper extremity.  Patient was seen by Dr. Rosalea Collin at bedside. Order ativan  2mg  IV, Keppra  1500mg  IV, and another ativan  2mg . Patient was transferred to [facility] unit.   Full admission assessment was not completed due to current patient status.

## 2023-07-24 NOTE — Progress Notes (Signed)
 Dr. Yadav gave order to discontinue ceribell. Patient is awake, alert to self and taking off monitoring equipment.

## 2023-07-24 NOTE — Progress Notes (Signed)
 PROGRESS NOTE    John Spears  WUX:[id number] DOB: [birth year] DOA: 07/23/2023 PCP: Patient, No Pcp Per   Assessment & Plan:   Principal Problem:   Seizure (HCC) Active Problems:   History of CVA (cerebrovascular accident)   Toxic metabolic encephalopathy   Paroxysmal atrial fibrillation (HCC)   Essential hypertension   HLD (hyperlipidemia)   Chronic diastolic CHF (congestive heart failure) (HCC)   BPH (benign prostatic hyperplasia)   Alcohol abuse  Assessment and Plan: Seizure: breakthrough. Secondary to noncompliance w/ meds. Continue on keppra , vimpat . Continue w/ neuro checks. Neuro following and recs apprec. UA ordered but not collected yet   Hypokalemia: potassium given    Hx of CVA: continue on statin, aspirin    PAF: continue on coreg . Hold xarelto  secondary to bleeding gum. Pt had several teeth pulled at the dentist and has had bleeding gums since that time as per pt's sister   HTN: continue on coreg , amlodipine    HLD: continue on statin    Chronic diastolic CHF: echo on 06/24/2022 showed EF 60 to 65% with grade 1 diastolic dysfunction.  Patient does not have leg edema or JVD. Appears compensated. Holding lasix     BPH: continue on home dose of flomax     Alcohol abuse: continue on CIWA protocol   Normocytic anemia: will transfuse if Hb < 7.0       DVT prophylaxis: SCDs Code Status: full  Family Communication: discussed pt's care w/ pt's sister, Ninette Basque, and answered her questions  Disposition Plan: depends on PT/OT recs  Level of care: Stepdown  Status is: Inpatient Remains inpatient appropriate because: severity of illness     Consultants:  Neuro  ICU  Procedures:  Antimicrobials:  Subjective: Pt c/o malaise  Objective: Vitals:   07/24/23 0700 07/24/23 0800 07/24/23 0811 07/24/23 0900  BP: (!) 161/62 (!) 157/69  (!) 153/72  Pulse: (!) 50 (!) 58 (!) 50 (!) 51  Resp: 14 16  15   Temp:  98.1 F (36.7 C)    TempSrc:  Oral    SpO2: 99%  100%  100%  Weight:      Height:        Intake/Output Summary (Last 24 hours) at 07/24/2023 0903 Last data filed at 07/24/2023 0900 Gross per 24 hour  Intake 108.19 ml  Output 200 ml  Net -91.81 ml   Filed Weights   07/23/23 1830 07/24/23 0500  Weight: 77.3 kg 77.5 kg    Examination:  General exam: Appears calm and comfortable  Respiratory system: Clear to auscultation. Respiratory effort normal. Cardiovascular system: S1 & S2 +. No rubs, gallops or clicks.  Gastrointestinal system: Abdomen is nondistended, soft and nontender. Normal bowel sounds heard. Central nervous system: Alert and oriented.  Psychiatry: Judgement and insight appears poor. Flat mood and affect      Data Reviewed: I have personally reviewed following labs and imaging studies  CBC: Recent Labs  Lab 07/23/23 1059 07/23/23 1836 07/24/23 0350  WBC 5.6 6.0 6.1  NEUTROABS  --  3.4  --   HGB 10.9* 10.0* 9.2*  HCT 35.1* 30.6* 28.7*  MCV 93.4 89.2 90.3  PLT 150 167 151   Basic Metabolic Panel: Recent Labs  Lab 07/23/23 1059 07/23/23 1836 07/24/23 0350  NA 140 139 138  K 4.8 4.8 3.1*  CL 107 106 105  CO2 22 25 27   GLUCOSE 86 117* 94  BUN 17 17 14   CREATININE 1.01 1.13 0.94  CALCIUM  9.3 9.2 9.0   GFR: Estimated  Creatinine Clearance: 76.8 mL/min (by C-G formula based on SCr of 0.94 mg/dL). Liver Function Tests: Recent Labs  Lab 07/23/23 1836  AST 27  ALT 14  ALKPHOS 59  BILITOT 0.8  PROT 7.1  ALBUMIN 3.7   No results for input(s): "LIPASE", "AMYLASE" in the last 168 hours. No results for input(s): "AMMONIA" in the last 168 hours. Coagulation Profile: Recent Labs  Lab 07/23/23 1959  INR 1.2   Cardiac Enzymes: No results for input(s): "CKTOTAL", "CKMB", "CKMBINDEX", "TROPONINI" in the last 168 hours. BNP (last 3 results) No results for input(s): "PROBNP" in the last 8760 hours. HbA1C: No results for input(s): "HGBA1C" in the last 72 hours. CBG: Recent Labs  Lab 07/23/23 2355  07/24/23 0154  GLUCAP 82 104*   Lipid Profile: No results for input(s): "CHOL", "HDL", "LDLCALC", "TRIG", "CHOLHDL", "LDLDIRECT" in the last 72 hours. Thyroid Function Tests: No results for input(s): "TSH", "T4TOTAL", "FREET4", "T3FREE", "THYROIDAB" in the last 72 hours. Anemia Panel: No results for input(s): "VITAMINB12", "FOLATE", "FERRITIN", "TIBC", "IRON ", "RETICCTPCT" in the last 72 hours. Sepsis Labs: No results for input(s): "PROCALCITON", "LATICACIDVEN" in the last 168 hours.  Recent Results (from the past 240 hours)  MRSA Next Gen by PCR, Nasal     Status: None   Collection Time: 07/24/23  2:51 AM   Specimen: Nasal Mucosa; Nasal Swab  Result Value Ref Range Status   MRSA by PCR Next Gen NOT DETECTED NOT DETECTED Final    Comment: (NOTE) The GeneXpert MRSA Assay (FDA approved for NASAL specimens only), is one component of a comprehensive MRSA colonization surveillance program. It is not intended to diagnose MRSA infection nor to guide or monitor treatment for MRSA infections. Test performance is not FDA approved in patients less than 82 years old. Performed at [facility], [address], [place], [place] [address]          Radiology Studies: Rapid EEG Result Date: 07/24/2023 Arleene Lack, MD     07/24/2023  6:41 AM Patient Name: John Spears MRN: [id number] Epilepsy Attending: Arleene Lack Referring Physician/Provider: Gideon Kussmaul, NP Duration: 07/24/2023 0211 to 07/24/2023 0630 Patient history: 73 y.o. male with medical history significant of seizure, HTN, H LD, dCHF, stroke, A fib on Xarelto , BPH, who presents with tremor. EEG to evaluate for seizure Level of alertness: asleep/ lethargic AEDs during EEG study: LEV, LCM, GBP, Ativan  Technical aspects: This EEG was obtained using a 10 lead EEG system positioned circumferentially without any parasagittal coverage (rapid EEG). Computer selected EEG is reviewed as  well as background  features and all clinically significant events. Description: Sleep was characterized by sleep spindles (12-14hz ), maximal fronto-central region.  EEG showed continuous generalized and lateralized left hemisphere 3 to 6 Hz theta-delta slowing with overriding 15 to 18 Hz beta activity distributed symmetrically and diffusely. Hyperventilation and photic stimulation were not performed.  Of note, parts of study were technically difficult due to significant electrode artifact.    ABNORMALITY - Continuous slow, generalized and lateralized left hemisphere  IMPRESSION: This limited ceribell study is suggestive of cortical dysfunction arising from left hemisphere likely secondary to underlying structural abnormality, post-ictal state. Additionally there is moderate to severe diffuse encephalopathy. No seizures or epileptiform discharges were seen throughout the recording.  Priyanka Suzanne Erps    CT HEAD WO CONTRAST ( ) Result Date: 07/23/2023 CLINICAL DATA:  Altered mental status, right facial droop EXAM: CT HEAD WITHOUT CONTRAST TECHNIQUE: Contiguous axial images were obtained from the base of  the skull through the vertex without intravenous contrast. RADIATION DOSE REDUCTION: This exam was performed according to the departmental dose-optimization program which includes automated exposure control, adjustment of the mA and/or kV according to patient size and/or use of iterative reconstruction technique. COMPARISON:  06/12/2023 FINDINGS: Brain: Normal anatomic configuration. Parenchymal volume loss is commensurate with the patient's age. Stable mild periventricular white matter changes are present likely reflecting the sequela of small vessel ischemia. Remote left parietal cortical infarct again noted. No abnormal intra or extra-axial mass lesion or fluid collection. No abnormal mass effect or midline shift. No evidence of acute intracranial hemorrhage or infarct. Ventricular size is normal. Cerebellum unremarkable.  Vascular: No asymmetric hyperdense vasculature at the skull base. Skull: Intact Sinuses/Orbits: Paranasal sinuses are clear. Orbits are unremarkable. Other: Mastoid air cells and middle ear cavities are clear. IMPRESSION: 1. No evidence of acute intracranial hemorrhage or infarct. 2. Stable remote left parietal cortical infarct. 3. Stable mild periventricular white matter changes likely reflecting the sequela of small vessel ischemia. Electronically Signed   By: Worthy Heads M.D.   On: 07/23/2023 20:46        Scheduled Meds:  amLODipine   10 mg Oral QHS   aspirin  EC  81 mg Oral Daily   atorvastatin   40 mg Oral QHS   carvedilol   12.5 mg Oral BID WC   Chlorhexidine  Gluconate Cloth  6 each Topical Daily   cyanocobalamin   1,000 mcg Oral Daily   folic acid   1 mg Oral Daily   gabapentin  100 mg Oral BID   levETIRAcetam   1,500 mg Intravenous Q12H   LORazepam   0-4 mg Intravenous Q6H   Followed by   Cecily Cohen ON 07/26/2023] LORazepam   0-4 mg Intravenous Q12H   multivitamin with minerals  1 tablet Oral Daily   tamsulosin   0.4 mg Oral QHS   thiamine   100 mg Oral Daily   Or   thiamine   100 mg Intravenous Daily   Continuous Infusions:  lacosamide  (VIMPAT ) IV Stopped (07/24/23 0052)   potassium chloride  100 mL/hr at 07/24/23 0900     LOS: 0 days     Alphonsus Jeans, MD [facility] Pager [phone]  If 7PM-7AM, please contact night-coverage [email or web address] 07/24/2023, 9:03 AM

## 2023-07-24 NOTE — Consult Note (Signed)
 NAME:  John Spears, MRN:  [id number], DOB:  September 18, 1950, LOS: 0 ADMISSION DATE:  07/23/2023, CONSULTATION DATE:  07/24/23 REFERRING MD:  Niu, Xillin  REASON FOR CONSULT: Seizures    HPI  73 y.o male with significant PMH of HF with recovered EF, MCA stroke, HTN, EtOH abuse disorder, A-fib on Xarelto , BPH and HLD who presented to the ED with chief complaints of tremors and possible seizure breakthrough.  Per chart, patient has been off his Keppra  for the past 2 days due to the dental procedure.  He developed tremors yesterday morning which persisted often precedes his seizure activity.  Patient was seen in the ED and discharged after negative workup to continue with his normal AED dosing.  He returned to the ED this evening with worsening tremors, right facial droop and right unresponsiveness concerning for possible seizures.  Per sister, patient is usually on Keppra  1500 mg twice daily, and lacosamide  150 twice daily but he only takes Keppra    ED Course: Initial vital signs showed HR of beats/minute, BP mm Hg, the RR 30 breaths/minute, and the oxygen saturation % on and a temperature of 98.11F (36.9C).  Pertinent Labs/Diagnostics Findings: BMP unremarkable WBC: Unremarkable CTH> no acute intracranial abnormality Medication administered in the ED: Patient was loaded with Keppra  1.5 g and admitted to TRH service.    Past Medical History  HF with recovered EF, MCA stroke, HTN, EtOH abuse disorder, A-fib on Xarelto , BPH and HLD   Significant Hospital Events   5/30: Admitted to [facility] service with breakthrough seizures 5/31:After arriving to the unit patient had a seizure-episode he received a total of 6 mg of Ativan  IV but was still seizing therefore was loaded with additional 1.5 g of Keppra .  Patient was transferred to stepdown unit and PCCM consulted  Consults:  Neurology  Procedures:  None  Significant Diagnostic Tests:  5/30: Noncontrast CT head> IMPRESSION: 1. No evidence of  acute intracranial hemorrhage or infarct. 2. Stable remote left parietal cortical infarct. 3. Stable mild periventricular white matter changes likely reflecting the sequela of small vessel ischemia.  Interim History / Subjective:      Micro Data:  None  Antimicrobials:  None  OBJECTIVE   Blood pressure (!) 155/67, pulse (!) 59, temperature 98.2 F (36.8 C), temperature source Axillary, resp. rate 15, height 5\' 11"  (1.803 m), weight 77.3 kg, SpO2 100%.       No intake or output data in the 24 hours ending 07/24/23 0340 Filed Weights   07/23/23 1830  Weight: 77.3 kg     Physical Examination  GENERAL: 73 year-old critically ill patient lying in the bed in no acute distress EYES: PEERLA. No scleral icterus. Extraocular muscles intact.  HEENT: Head atraumatic, normocephalic. Oropharynx and nasopharynx clear.  NECK:  No JVD, supple  LUNGS: Normal breath sounds bilaterally.  No use of accessory muscles of respiration.  CARDIOVASCULAR: S1, S2 normal. No murmurs, rubs, or gallops.  ABDOMEN: Soft, NTND EXTREMITIES: Right sided weakness with involuntary tremors and jerking movement. No swelling or erythema.  Capillary refill < 3 seconds in all extremities. Pulses palpable distally. NEUROLOGIC: The patient is post ictal and unable to follow commands. No focal neurological deficit appreciated. Cranial nerves are intact.  SKIN: No obvious rash, lesion, or ulcer. Warm to touch Labs/imaging that I havepersonally reviewed  (right click and "Reselect all SmartList Selections" daily)     Labs   CBC: Recent Labs  Lab 07/23/23 1059 07/23/23 1836  WBC 5.6 6.0  NEUTROABS  --  3.4  HGB 10.9* 10.0*  HCT 35.1* 30.6*  MCV 93.4 89.2  PLT 150 167    Basic Metabolic Panel: Recent Labs  Lab 07/23/23 1059 07/23/23 1836  NA 140 139  K 4.8 4.8  CL 107 106  CO2 22 25  GLUCOSE 86 117*  BUN 17 17  CREATININE 1.01 1.13  CALCIUM  9.3 9.2   GFR: Estimated Creatinine Clearance: 63.9  mL/min (by C-G formula based on SCr of 1.13 mg/dL). Recent Labs  Lab 07/23/23 1059 07/23/23 1836  WBC 5.6 6.0    Liver Function Tests: Recent Labs  Lab 07/23/23 1836  AST 27  ALT 14  ALKPHOS 59  BILITOT 0.8  PROT 7.1  ALBUMIN 3.7   No results for input(s): "LIPASE", "AMYLASE" in the last 168 hours. No results for input(s): "AMMONIA" in the last 168 hours.  ABG    Component Value Date/Time   PHART 7.350 01/27/2023 1649   PCO2ART 37.9 01/27/2023 1649   PO2ART 114 (H) 01/27/2023 1649   HCO3 20.8 01/27/2023 1649   TCO2 18 (L) 03/09/2023 1426   ACIDBASEDEF 4.0 (H) 01/27/2023 1649   O2SAT 98 01/27/2023 1649     Coagulation Profile: Recent Labs  Lab 07/23/23 1959  INR 1.2    Cardiac Enzymes: No results for input(s): "CKTOTAL", "CKMB", "CKMBINDEX", "TROPONINI" in the last 168 hours.  HbA1C: Hgb A1c MFr Bld  Date/Time Value Ref Range Status  01/26/2023 02:31 PM 5.3 4.8 - 5.6 % Final    Comment:    (NOTE) Pre diabetes:          5.7%-6.4%  Diabetes:              >6.4%  Glycemic control for   <7.0% adults with diabetes   02/21/2022 04:18 AM 5.0 4.8 - 5.6 % Final    Comment:    (NOTE)         Prediabetes: 5.7 - 6.4         Diabetes: >6.4         Glycemic control for adults with diabetes: <7.0     CBG: Recent Labs  Lab 07/23/23 2355 07/24/23 0154  GLUCAP 82 104*    Review of Systems:   Unable to be obtained secondary to the patient's altered mental status.   Past Medical History  He,  has a past medical history of Alcohol abuse, Heart failure with improved ejection fraction (HFimpEF) (HCC) (02/2022), Hypertension, Seizure (HCC), and Seizure disorder (HCC).   Surgical History    Past Surgical History:  Procedure Laterality Date   JOINT REPLACEMENT       Social History   reports that he has quit smoking. He has never used smokeless tobacco. He reports that he does not currently use alcohol after a past usage of about 84.0 standard drinks of  alcohol per week. He reports that he does not currently use drugs after having used the following drugs: Marijuana.   Family History   His family history includes Heart attack (age of onset: 10) in his mother.   Allergies No Known Allergies   Home Medications  Prior to Admission medications   Medication Sig Start Date End Date Taking? Authorizing Provider  acetaminophen  (TYLENOL ) 500 MG tablet Take 1,000 mg by mouth every 6 (six) hours as needed for moderate pain (pain score 4-6).   Yes [provider]  amLODipine  (NORVASC ) 10 MG tablet Take 10 mg by mouth at bedtime. 01/13/23  Yes [provider]  ASPIRIN  LOW DOSE 81 MG tablet Take 81 mg by mouth daily. 07/05/23  Yes [provider]  Aspirin -Salicylamide-Caffeine (ARTHRITIS STRENGTH BC POWDER PO) Take 1 Package by mouth every 6 (six) hours as needed (Pain).   Yes [provider]  atorvastatin  (LIPITOR) 40 MG tablet Take 40 mg by mouth at bedtime.   Yes [provider]  carvedilol  (COREG ) 12.5 MG tablet Take 1 tablet (12.5 mg total) by mouth 2 (two) times daily. 07/05/23 07/04/24 Yes Hammock, Sheri, NP  chlorhexidine  (PERIDEX ) 0.12 % solution Use as directed 10 mLs in the mouth or throat 2 (two) times daily. 07/22/23  Yes [provider]  cyanocobalamin  (VITAMIN B12) 1000 MCG tablet Take 1,000 mcg by mouth in the morning. 11/24/22  Yes [provider]  folic acid  (FOLVITE ) 1 MG tablet Take 1 mg by mouth in the morning. 07/16/22  Yes [provider]  furosemide  (LASIX ) 20 MG tablet TAKE 1 TABLET BY MOUTH ONCE A DAY 11/09/22  Yes Florette Hurry, NP  HYDROcodone-acetaminophen  (NORCO/VICODIN) 5-325 MG tablet Take 1 tablet by mouth every 6 (six) hours as needed for moderate pain (pain score 4-6).   Yes [provider]  levETIRAcetam  (KEPPRA ) 750 MG tablet Take 2 tablets (1,500 mg total) by mouth 2 (two) times daily. 03/11/23 07/23/23 Yes Vada Garibaldi, MD  magnesium   oxide (MAG-OX) 400 MG tablet Take 400 mg by mouth daily. 02/01/23  Yes [provider]  Multiple Vitamin (MULTIVITAMIN WITH MINERALS) TABS tablet Take 1 tablet by mouth daily. Patient taking differently: Take 1 tablet by mouth every evening. 12/31/22  Yes Verlyn Goad, MD  oxyCODONE -acetaminophen  (PERCOCET) 5-325 MG tablet Take 1 tablet by mouth every 4 (four) hours as needed. 06/12/23 06/11/24 Yes Twilla Galea, MD  rivaroxaban  (XARELTO ) 20 MG TABS tablet Take 1 tablet (20 mg total) by mouth daily with supper. 05/17/23  Yes Hammock, Sheri, NP  tamsulosin  (FLOMAX ) 0.4 MG CAPS capsule Take 0.4 mg by mouth at bedtime.   Yes [provider]  thiamine  (VITAMIN B1) 100 MG tablet Take 100 mg by mouth in the morning. 07/16/22  Yes [provider]  feeding supplement (ENSURE ENLIVE / ENSURE PLUS) LIQD Take 237 mLs by mouth 2 (two) times daily between meals. 12/30/22   Verlyn Goad, MD  gabapentin (NEURONTIN) 100 MG capsule Take 100 mg by mouth 2 (two) times daily.    [provider]  lacosamide  150 MG TABS Take 1 tablet (150 mg total) by mouth 2 (two) times daily. 03/16/23   Vada Garibaldi, MD  lidocaine  (LIDODERM ) 5 % Place 1 patch onto the skin every 12 (twelve) hours. Remove & Discard patch within 12 hours or as directed by MD 06/12/23 06/11/24  Twilla Galea, MD  Scheduled Meds:  amLODipine   10 mg Oral QHS   aspirin  EC  81 mg Oral Daily   atorvastatin   40 mg Oral QHS   carvedilol   12.5 mg Oral BID WC   cyanocobalamin   1,000 mcg Oral Daily   folic acid   1 mg Oral Daily   gabapentin  100 mg Oral BID   levETIRAcetam   1,500 mg Intravenous Q12H   LORazepam   0-4 mg Intravenous Q6H   Followed by   Cecily Cohen ON 07/26/2023] LORazepam   0-4 mg Intravenous Q12H   multivitamin with minerals  1 tablet Oral Daily   tamsulosin   0.4 mg Oral QHS   thiamine   100 mg Oral Daily   Or   thiamine   100 mg Intravenous Daily  Continuous Infusions:  lacosamide  (VIMPAT ) IV 150 mg  (07/24/23 0022)   PRN Meds:.LORazepam  **OR** LORazepam , LORazepam , oxyCODONE -acetaminophen   Active Hospital Problem list   See systems below  Assessment & Plan:  #Seizures Hx of Seizures with recurrent breakthrough seizures with right sided weakness. 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 Renaee Caro  #Acute Metabolic Encephalopathy Hx of ETOH abuse,per sister has not drank since 01/2023. Ethanol negative  -Likely secondary to seizure -CT head with no acute finding  -check UA, CXR for infectious cause -monitor for s/s of withdrawal -Start on thiamine , folate and MV -CIWA protocol if indicated  #Chronic diastolic CHF (recovered EF per last2  D echo on 06/24/2022 showed EF 60 to 65%  with grade 1 diastolic dysfunction.  #HTN #HLD #Atrial Fibrillation on Eliquis  Appears euvolemic -Hold Lasix  for now -Hold Eliquis /Anticoagulation due to bleeding gum, restart once stable -Hold GDMTs, will resume as clinically appropriate    Best practice:  Diet:  Oral Pain/Anxiety/Delirium protocol (if indicated): No VAP protocol (if indicated): Not indicated DVT prophylaxis: Contraindicated GI prophylaxis: PPI Glucose control:  SSI No Central venous access:  N/A Arterial line:  N/A Foley:  N/A Mobility:  bed rest  PT consulted: N/A Last date of multidisciplinary goals of care discussion [no family at bedside] Code Status:  full code Disposition: SDU   = Goals of Care  = Code Status Order: FULL   Primary Emergency Contact: Slade,Betty, Home Phone: [phone] Wishes to pursue full aggressive treatment and intervention options, including CPR and intubation, but goals of care will be addressed on going with family if that should become necessary.   Critical care time: 45 minutes        Alonza Arthurs DNP, CCRN, FNP-C, AGACNP-BC Acute Care & Family Nurse Practitioner [place] [facility] PCCM on call pager [phone]

## 2023-07-24 NOTE — Plan of Care (Signed)

## 2023-07-24 NOTE — TOC Initial Note (Addendum)
 Transition of Care [facility]) - Initial/Assessment Note    Patient Details  Name: John Spears MRN: [id number] Date of Birth: May 08, 1950  [facility] CM/SW Contact:    Crayton Docker, RN 07/24/2023, 2:11 PM  Clinical Narrative:                  CM to patient's room regarding TOC screening. CM introduced case management role and discharge care planning process. Patient verbalized understanding and agreement with screening interview. Per patient lives with patient's sister, Ninette Basque. Per patient, anticipates returning home with patient's sister. Per patient, patient's sister, is HCPOA; no noted advanced directives. Per patient, has a rolling walker. 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  CM and patient discussed substance use recovery consult. CM provided, at bedside, list of substance use recovery resources for review.   MOON letter explained to patient, patient declined to sign, CM signed, printed and provided copy to patient.  Barriers to Discharge: Continued Medical Work up   Patient Goals and CMS Choice    Home/self care  Expected Discharge Plan and Services      Home/self care   Prior Living Arrangements/Services   Lives with:: Siblings Patient language and need for interpreter reviewed:: No        Need for Family Participation in Patient Care: Yes (Comment) Care giver support system in place?: Yes (comment) Current home services: DME, Home RN (previous [facility] experience) Criminal Activity/Legal Involvement Pertinent to Current Situation/Hospitalization: No - Comment as needed  Activities of Daily Living    Per patient comment, is independent in ADLs  Permission Sought/Granted      Share Information with NAME: Ninette Basque Slade/Taran Dales/Tabias Dawne Euler     Permission granted to share info w Relationship: Sister/Niece/Son  Permission granted to share info w Contact Information: yes  Emotional Assessment Appearance:: Appears stated  age Attitude/Demeanor/Rapport: Engaged Affect (typically observed): Calm Orientation: : Oriented to Self Alcohol / Substance Use: Alcohol Use (Per patient comment, 3-4 months ago)    Admission diagnosis:  Seizure [facility]) [R56.9] Patient Active Problem List   Diagnosis Date Noted   HLD (hyperlipidemia) 07/23/2023   BPH (benign prostatic hyperplasia) 07/23/2023   Chronic diastolic CHF (congestive heart failure) (HCC) 03/09/2023   Paroxysmal atrial fibrillation (HCC) 03/09/2023   Seizure (HCC) 03/09/2023   Toxic metabolic encephalopathy 03/09/2023   Acute metabolic encephalopathy 01/26/2023   Malnutrition of moderate degree 12/24/2022   ETOH abuse 12/22/2022   Thrombocytopenia (HCC) 12/22/2022   Breakthrough seizure (HCC) 12/22/2022   CKD (chronic kidney disease), stage III (HCC) 09/25/2022   Chest congestion 04/04/2022   Chronic cough 04/04/2022   Dilated cardiomyopathy (HCC) 02/26/2022   Alcohol abuse 02/26/2022   Seizures (HCC) 02/20/2022   Transaminitis    Traumatic rhabdomyolysis (HCC)    Thrombocytopenia (HCC)    History of CVA (cerebrovascular accident) 10/22/2016   Essential hypertension 10/22/2016   Alcoholism (HCC) 10/22/2016   Cerebrovascular accident (CVA) (HCC) 04/05/2016   PCP:  Patient, No Pcp Per Pharmacy:   [facility] [address]), [place] - [address] [place] [address] Phone: [phone] Fax: [phone]  [facility] [address], [place] - [address] [place] [address] Phone: [phone] Fax: [phone]  [facility] - [place], [place] - [address] [place] [address] Phone: [phone] Fax: [phone]  [facility] [address]  [place] [address] Phone: [phone] Fax: [phone]     Social Drivers of Health (SDOH) Social History: SDOH Screenings   Food  Insecurity: Patient Unable To Answer (07/24/2023)  Housing: Low Risk  (04/06/2023)   Received from [facility]  Transportation Needs: Unmet Transportation Needs (04/06/2023)   Received from [facility]  Utilities: Not At Risk (04/06/2023)   Received from [facility]  Financial Resource Strain: Medium Risk (04/06/2023)   Received from [facility]  Social Connections: Unknown (03/12/2023)  Tobacco Use: Medium Risk (07/23/2023)   SDOH Interventions:     Readmission Risk Interventions     No data to display

## 2023-07-24 NOTE — Consult Note (Signed)
 NEUROLOGY CONSULT NOTE   Date of service: Jul 24, 2023 Patient Name: John Spears MRN:  [id number] DOB:  11-27-1950 Chief Complaint: "Seizure" Requesting Provider: Alphonsus Jeans, MD  History of Present Illness  John Spears is a 73 y.o. 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  Patient reported that he was off of his Keppra  for a dental procedure although chart review indicates that he might have been off of his Xarelto  but probably was not taking his antiseizure medications. He had some worsening right upper extremity twitching.  Then on the floor, he had intermittent right upper extremity shaking for which he received Ativan .  His home Keppra  and Vimpat  were resumed in the hospital.  Overnight neurologist was presumably consulted by the consulting PCCM APP for rapid EEG, which was read this morning-rapid EEG was done from around 2:00 AM to 9:00 AM-which showed cerebral dysfunction arising from left hemisphere likely secondary to underlying structural abnormality or postictal state.  Additionally moderate to severe diffuse encephalopathy.  No seizures or epileptiform discharges seen during the rapid EEG recording. I was consulted by the hospitalist for recommendations on ASDs  During my assessment he did have some more right arm twitching but no change in his level of consciousness.  Has some tremors at baseline in bilateral upper extremities.   ROS  Comprehensive ROS performed and pertinent positives documented in HPI   Past History   Past Medical History:  Diagnosis Date   Alcohol abuse    Heart failure with improved ejection fraction (HFimpEF) (HCC) 02/2022   a. 02/2019  Echo: EF 60-65%, GrI DD; b. 01/2022 Echo: EF 30-35%; c. 06/2022 Echo: EF 60-65%, mild LVH, RI DD, nl RV fxn, triv MR.   Hypertension    Seizure (HCC)    Seizure disorder [facility])     Past Surgical History:  Procedure Laterality Date   JOINT REPLACEMENT      Family History: Family History  Problem Relation Age of Onset   Heart attack Mother 13    Social History  reports that he has quit smoking. He has never used smokeless tobacco. He reports that he does not currently use alcohol after a past usage of about 84.0 standard drinks of alcohol per week. He reports that he does not currently use drugs after having used the following drugs: Marijuana.  No Known Allergies  Medications   Current Facility-Administered Medications:    amLODipine  (NORVASC ) tablet 10 mg, 10 mg, Oral, QHS, Niu, Xilin, MD   aspirin  EC tablet 81 mg, 81 mg, Oral, Daily, Niu, Xilin, MD, 81 mg at 07/24/23 1052   atorvastatin  (LIPITOR) tablet 40 mg, 40 mg, Oral, QHS, Niu, Xilin, MD   carvedilol  (COREG ) tablet 12.5 mg, 12.5 mg, Oral, BID WC, Niu, Xilin, MD   Chlorhexidine  Gluconate Cloth 2 % PADS 6 each, 6 each, Topical, Daily, Alphonsus Jeans, MD, 6 each at 07/24/23 4098   cyanocobalamin  (VITAMIN B12) tablet 1,000 mcg, 1,000 mcg, Oral, Daily, Niu, Xilin, MD, 1,000 mcg at 07/24/23 1052   folic acid  (FOLVITE ) tablet 1 mg, 1 mg, Oral, Daily, Niu, Xilin, MD, 1 mg at 07/24/23 1052   gabapentin (NEURONTIN) capsule 100 mg, 100 mg, Oral, BID, Niu, Xilin, MD, 100 mg at 07/24/23 1052   lacosamide  (VIMPAT )  150 mg in sodium chloride  0.9 % 25 mL IVPB, 150 mg, Intravenous, Q12H, Niu, Xilin, MD, Last Rate: 80 mL/hr at 07/24/23 1050, 150 mg at 07/24/23 1050   levETIRAcetam  (KEPPRA ) undiluted injection 1,500 mg, 1,500 mg, Intravenous, Q12H, Niu, Xilin, MD, 1,500 mg at 07/24/23 1054   LORazepam  (ATIVAN ) injection 0-4 mg, 0-4 mg, Intravenous, Q6H **FOLLOWED BY** [START ON 07/26/2023] LORazepam  (ATIVAN ) injection 0-4 mg, 0-4 mg,  Intravenous, Q12H, Niu, Xilin, MD   LORazepam  (ATIVAN ) tablet 1-4 mg, 1-4 mg, Oral, Q1H PRN **OR** LORazepam  (ATIVAN ) injection 1-4 mg, 1-4 mg, Intravenous, Q1H PRN, Niu, Xilin, MD   LORazepam  (ATIVAN ) injection 2 mg, 2 mg, Intravenous, Q2H PRN, Niu, Xilin, MD, 2 mg at 07/24/23 0118   multivitamin with minerals tablet 1 tablet, 1 tablet, Oral, Daily, Niu, Xilin, MD, 1 tablet at 07/24/23 1104   oxyCODONE -acetaminophen  (PERCOCET/ROXICET) 5-325 MG per tablet 1 tablet, 1 tablet, Oral, Q4H PRN, Niu, Xilin, MD   potassium chloride  10 mEq in 100 mL IVPB, 10 mEq, Intravenous, Q1 Hr x 4, [place], Phoebe Breed, NP, Last Rate: 100 mL/hr at 07/24/23 1018, 10 mEq at 07/24/23 1018   tamsulosin  (FLOMAX ) capsule 0.4 mg, 0.4 mg, Oral, QHS, Niu, Xilin, MD   thiamine  (VITAMIN B1) tablet 100 mg, 100 mg, Oral, Daily, 100 mg at 07/24/23 1052 **OR** thiamine  (VITAMIN B1) injection 100 mg, 100 mg, Intravenous, Daily, Fidencio Hue, MD  Vitals   Vitals:   07/24/23 0700 07/24/23 0800 07/24/23 0811 07/24/23 0900  BP: (!) 161/62 (!) 157/69  (!) 153/72  Pulse: (!) 50 (!) 58 (!) 50 (!) 51  Resp: 14 16  15   Temp:  98.1 F (36.7 C)    TempSrc:  Oral    SpO2: 99% 100%  100%  Weight:      Height:        Body mass index is 23.83 kg/m.  Physical Exam   General: Comfortably sitting in bed, somewhat leaning to the right HEENT: Normocephalic atraumatic Lungs: Clear Cardiovascular: Regular rate rhythm Neurological exam He is awake alert oriented the fact that he is in the hospital Was able to tell me the month correctly Speech is dysarthric He is slow to respond to questions and possibly has mild aphasia. Cranial nerve examination: Pupils equal round react to light, extraocular movements intact, visual fields appear full, face appears grossly symmetric.  Tongue and palate midline. Motor examination with mild right hemiparesis.  Left side appears to be much stronger than the right although all 4 extremities have mild  drift. Sensory exam: Intact to light touch Coordination difficult to assess but no gross dysmetria   During my exam, he did have some right upper extremity twitching which resolved spontaneously-there was no changes mentation or gaze issues at that time.  Labs/Imaging/Neurodiagnostic studies   CBC:  Recent Labs  Lab 2023-08-21 1836 07/24/23 0350  WBC 6.0 6.1  NEUTROABS 3.4  --   HGB 10.0* 9.2*  HCT 30.6* 28.7*  MCV 89.2 90.3  PLT 167 151   Basic Metabolic Panel:  Lab Results  Component Value Date   NA 138 07/24/2023   K 3.1 (L) 07/24/2023   CO2 27 07/24/2023   GLUCOSE 94 07/24/2023   BUN 14 07/24/2023   CREATININE 0.94 07/24/2023   CALCIUM  9.0 07/24/2023   GFRNONAA >60 07/24/2023   GFRAA >60 04/04/2019   Lipid Panel:  Lab Results  Component Value Date   LDLCALC 133 (H) 03/08/2019   HgbA1c:  Lab Results  Component Value Date  HGBA1C 5.3 01/26/2023   Urine Drug Screen:     Component Value Date/Time   LABOPIA POSITIVE (A) 07/24/2023 0600   LABOPIA NONE DETECTED 01/27/2023 1800   COCAINSCRNUR NONE DETECTED 07/24/2023 0600   LABBENZ NONE DETECTED 07/24/2023 0600   LABBENZ POSITIVE (A) 01/27/2023 1800   AMPHETMU NONE DETECTED 07/24/2023 0600   AMPHETMU NONE DETECTED 01/27/2023 1800   THCU NONE DETECTED 07/24/2023 0600   THCU NONE DETECTED 01/27/2023 1800   LABBARB NONE DETECTED 07/24/2023 0600   LABBARB POSITIVE (A) 01/27/2023 1800    Alcohol Level     Component Value Date/Time   [facility] <15 07/23/2023 1836   INR  Lab Results  Component Value Date   INR 1.2 07/23/2023   APTT  Lab Results  Component Value Date   APTT <22 (L) 07/23/2023   AED levels:  Lab Results  Component Value Date   PHENYTOIN  11.9 02/02/2023    CT Head without contrast(Personally reviewed): Stable remote left parietal cortical infarct.  Stable mild periventricular white matter changes likely reflecting sequela of small vessel ischemia.  No evidence of acute intracranial  hemorrhage or infarct.  Neurodiagnostics Rapid EEG: 2:00 AM to 9:00 AM-which showed cerebral dysfunction arising from left hemisphere likely secondary to underlying structural abnormality or postictal state.  Additionally moderate to severe diffuse encephalopathy.  No seizures or epileptiform discharges seen during the rapid EEG recording.   ASSESSMENT   John Spears is a 73 y.o. male past history of seizures with history of breakthrough seizures due to noncompliance multiple times with prolonged postictal state and Todd's paralysis presenting for increasing frequency of right arm twitching and then breakthrough seizures requiring multiple doses of benzodiazepines. His home Keppra  and lamotrigine  have been resumed. His exam looks to be improving.  Impression: Breakthrough seizure in the setting of missed doses of medication/noncompliance.  RECOMMENDATIONS  Continue to monitor clinically Maintain seizure precautions Continue Keppra  1.5 g twice daily He is on Vimpat  150 twice daily.  I would continue that dose for now. Stressed on compliance to medications. Check for any underlying evidence of infectious process such as UTI or pneumonia that might of lowered seizure threshold in addition to the noncompliance to medications.  If present, treat per primary team. Continue following up with outpatient neurology-follows with [facility] neurology   Please use Ativan  only for seizure lasts longer than 5 minutes.  Please also call neurology at that time.  Plan discussed with Dr. Broadus Canes.  I will be available as needed. ______________________________________________________________________    Leala Prince, MD [facility]

## 2023-07-24 NOTE — Evaluation (Signed)
 Clinical/Bedside Swallow Evaluation Patient Details  Name: John Spears MRN: [id number] Date of Birth: 1950/04/27  Today's Date: 07/24/2023 Time: SLP Start Time (ACUTE ONLY): 1000 SLP Stop Time (ACUTE ONLY): 1025 SLP Time Calculation (min) (ACUTE ONLY): 25 min  Past Medical History:  Past Medical History:  Diagnosis Date   Alcohol abuse    Heart failure with improved ejection fraction (HFimpEF) (HCC) 02/2022   a. 02/2019 Echo: EF 60-65%, GrI DD; b. 01/2022 Echo: EF 30-35%; c. 06/2022 Echo: EF 60-65%, mild LVH, RI DD, nl RV fxn, triv MR.   Hypertension    Seizure (HCC)    Seizure disorder [facility])    Past Surgical History:  Past Surgical History:  Procedure Laterality Date   JOINT REPLACEMENT     HPI:  Per H&P, " John Spears is a 73 y.o. male with medical history significant of seizure, HTN, H LD, dCHF, stroke, A fib on Xarelto , BPH, who presents with tremor.     Per her sister (I called her sister by phone), pt has not been taking his Keppra  in the past two days due to dental procedure. Pt had 4 teeth pullout. His bottom gums are oozing blood. He still has oozing blood from dental surgery. Pt developed tremors this AM which often precede his seizure activity per her sister. Pt was seen in ED this AM, and had negative work up. Pt was discharged home and instructed to return to his normal Keppra  dosing. After went home, pt developed right hand shaking, right facial droop and became unresponsive per her sister. Pt was brought back to ED again. 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  2. Stable remote left parietal cortical infarct.  3. Stable mild periventricular white matter changes likely  reflecting the sequela of small vessel ischemia.    Assessment / Plan / Recommendation  Clinical Impression   Pt seen for bedside swallow assessment in the setting of seizure. Pt initially with reduced alertness, though RN reporting increase this AM. At time of assessment pt was alert and ready for assessment. Pt seen with trials of thin liquids, puree solids, and mech soft consistent solids. Pt able to hold cup/bowl, though reduced ability with use of utensil for self feeding based on tremor with RUE. No overt or subtle s/sx pharyngeal dysphagia noted. No change to vocal quality across trials. Vitals stable for duration of trials. Oral phase impacted by lack of dentition and oral soreness (pt reports recent dental procedure for teeth removal, denied currently having dentures). Increased time for mastication and clearance noted for mech soft consistent solids.   Recommend Dys 2 (chopped) to aid oral manipulation and thin liquids. General aspiration precautions (slow rate, small bite, elevated HOB, and alert for PO intake). Assist/set up for meals. Pt reporting understanding for diet and rationale for soft solids to aid mastication. MD and RN aware of recommendations. No further acute SLP services indicated at this time.   SLP Visit Diagnosis: Dysphagia, oral phase (R13.11)    Aspiration Risk  Mild aspiration risk    Diet Recommendation   Dysphagia 2 (chopped);Thin  Medication Administration: Whole meds with liquid    Other  Recommendations Oral Care Recommendations: Oral care before and after PO;Oral care BID;Staff/trained caregiver to provide oral care    Recommendations for follow up therapy are one component of a multi-disciplinary  discharge planning process, led by the attending physician.  Recommendations may be updated based on patient status, additional functional criteria and insurance authorization.  Follow up Recommendations Follow physician's recommendations for discharge plan and follow up therapies      Assistance Recommended at Discharge  Assistance for feeding related to upper extremity  tremor   Functional Status Assessment Patient has not had a recent decline in their functional status    Swallow Study   General Date of Onset: 07/24/23 HPI: Per H&P, " John Spears is a 73 y.o. male with medical history significant of seizure, HTN, H LD, dCHF, stroke, A fib on Xarelto , BPH, who presents with tremor.     Per her sister (I called her sister by phone), pt has not been taking his Keppra  in the past two days due to dental procedure. Pt had 4 teeth pullout. His bottom gums are oozing blood. He still has oozing blood from dental surgery. Pt developed tremors this AM which often precede his seizure activity per her sister. Pt was seen in ED this AM, and had negative work up. Pt was discharged home and instructed to return to his normal Keppra  dosing. After went home, pt developed right hand shaking, right facial droop and became unresponsive per her sister. Pt was brought back to ED again. 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  2. Stable remote left parietal cortical infarct.  3. Stable mild periventricular white matter changes likely  reflecting the sequela of small vessel ischemia. Type of Study: Bedside Swallow Evaluation Previous Swallow Assessment: none in chart Diet Prior to this Study: NPO Temperature Spikes Noted: No (WBC 6.1) Respiratory Status: Room air History of Recent Intubation: No Behavior/Cognition: Alert;Pleasant mood Oral Cavity Assessment: Edema (some residual edema and scabbing noted at the gum line- pt reported secondary to recent dental procedure) Oral Care Completed by SLP: Recent completion by staff Oral Cavity - Dentition: Edentulous (does not have dentures yet) Vision: Functional for self-feeding Self-Feeding Abilities: Needs assist (secondary to tremor) Patient  Positioning: Upright in bed Baseline Vocal Quality: Normal Volitional Cough: Strong Volitional Swallow: Able to elicit    Oral/Motor/Sensory Function Overall Oral Motor/Sensory Function: Mild impairment Facial ROM: Within Functional Limits Facial Symmetry: Within Functional Limits Facial Strength: Reduced right- minimal  Lingual ROM: Within Functional Limits Lingual Symmetry: Within Functional Limits Lingual Strength: Within Functional Limits Velum: Within Functional Limits Mandible: Within Functional Limits   Ice Chips Ice chips: Not tested   Thin Liquid Thin Liquid: Within functional limits Presentation: Cup;Straw    Nectar Thick Nectar Thick Liquid: Not tested   Honey Thick Honey Thick Liquid: Not tested   Puree Puree: Within functional limits Presentation: Self Fed   Solid     Solid: Impaired Presentation: Spoon Oral Phase Impairments: Impaired mastication Oral Phase Functional Implications: Impaired mastication;Prolonged oral transit Pharyngeal Phase Impairments:  (none)     John Eban Weick Clapp, MS, CCC-SLP Speech Language Pathologist Rehab Services; [facility] [place] [phone] (ascom)   John Spears 07/24/2023,12:27 PM

## 2023-07-24 NOTE — Plan of Care (Signed)
°  Problem: Clinical Measurements: °Goal: Ability to maintain clinical measurements within normal limits will improve °Outcome: Progressing °Goal: Diagnostic test results will improve °Outcome: Progressing °Goal: Cardiovascular complication will be avoided °Outcome: Progressing °  °

## 2023-07-25 DIAGNOSIS — R569 Unspecified convulsions: Secondary | ICD-10-CM | POA: Diagnosis not present

## 2023-07-25 LAB — BASIC METABOLIC PANEL WITH GFR
Anion gap: 8 (ref 5–15)
BUN: 13 mg/dL (ref 8–23)
CO2: 25 mmol/L (ref 22–32)
Calcium: 9 mg/dL (ref 8.9–10.3)
Chloride: 109 mmol/L (ref 98–111)
Creatinine, Ser: 0.81 mg/dL (ref 0.61–1.24)
GFR, Estimated: >60 mL/min (ref >60)
Glucose, Bld: 89 mg/dL (ref 70–99)
Potassium: 3.5 mmol/L (ref 3.5–5.1)
Sodium: 142 mmol/L (ref 135–145)

## 2023-07-25 LAB — CBC
HCT: 27.7 % — ABNORMAL LOW (ref 39.0–52.0)
Hemoglobin: 9.1 g/dL — ABNORMAL LOW (ref 13.0–17.0)
MCH: 29 pg (ref 26.0–34.0)
MCHC: 32.9 g/dL (ref 30.0–36.0)
MCV: 88.2 fL (ref 80.0–100.0)
Platelets: 153 10*3/uL (ref 150–400)
RBC: 3.14 MIL/uL — ABNORMAL LOW (ref 4.22–5.81)
RDW: 15.2 % (ref 11.5–15.5)
WBC: 5.8 10*3/uL (ref 4.0–10.5)
nRBC: 0 % (ref 0.0–0.2)

## 2023-07-25 MED ORDER — LACOSAMIDE 50 MG PO TABS
150.0000 mg | ORAL_TABLET | Freq: Two times a day (BID) | ORAL | Status: DC
  Administered 2023-07-25 – 2023-08-04 (×20): 150 mg via ORAL
  Filled 2023-07-25 (×20): qty 3

## 2023-07-25 MED ORDER — LEVETIRACETAM 500 MG PO TABS
1500.0000 mg | ORAL_TABLET | Freq: Two times a day (BID) | ORAL | Status: DC
  Administered 2023-07-25 – 2023-08-04 (×20): 1500 mg via ORAL
  Filled 2023-07-25 (×20): qty 3

## 2023-07-25 NOTE — Progress Notes (Signed)
 PROGRESS NOTE    John Spears  XBJ:[id number] DOB: 1951/02/04 DOA: 07/23/2023 PCP: Patient, No Pcp Per   Assessment & Plan:   Principal Problem:   Seizure (HCC) Active Problems:   History of CVA (cerebrovascular accident)   Toxic metabolic encephalopathy   Paroxysmal atrial fibrillation (HCC)   Essential hypertension   HLD (hyperlipidemia)   Chronic diastolic CHF (congestive heart failure) (HCC)   BPH (benign prostatic hyperplasia)   Alcohol abuse   Seizures (HCC)  Assessment and Plan: Seizure: breakthrough. Secondary to noncompliance w/ meds. Continue on vimpat , keppra . Continue w/ neuro checks. Neuro following and recs apprec. UA is unremarkable   Hypokalemia: WNL today    Hx of CVA: continue on statin, aspirin     PAF: continue on coreg . Hold xarelto  secondary to bleeding gum. Pt had several teeth pulled at the dentist and has had bleeding gums since that time as per pt's sister   HTN: continue on amlodipine , coreg    HLD: continue on statin    Chronic diastolic CHF: echo on 06/24/2022 showed EF 60 to 65% with grade 1 diastolic dysfunction.  Patient does not have leg edema or JVD. Appears compensated. Holding lasix    BPH: continue on home dose of flomax     Alcohol abuse: continue on CIWA protocol   Normocytic anemia: H&H are stable. No need for a transfusion currently       DVT prophylaxis: SCDs Code Status: full  Family Communication:  Disposition Plan:likely d/c back home w/ HH   Level of care: Progressive  Status is: Inpatient Remains inpatient appropriate because: severity of illness     Consultants:  Neuro  ICU  Procedures:  Antimicrobials:  Subjective: Pt c/o fatigue   Objective: Vitals:   07/25/23 0000 07/25/23 0346 07/25/23 0500 07/25/23 0600  BP: (!) 147/72 133/74  133/74  Pulse: (!) 58 (!) 57  (!) 57  Resp: 18 18    Temp: 97.9 F (36.6 C) 97.8 F (36.6 C)    TempSrc:  Axillary    SpO2: 98% 100%    Weight:   72.1 kg    Height:        Intake/Output Summary (Last 24 hours) at 07/25/2023 0824 Last data filed at 07/25/2023 0551 Gross per 24 hour  Intake 1064.8 ml  Output 1400 ml  Net -335.2 ml   Filed Weights   07/23/23 1830 07/24/23 0500 07/25/23 0500  Weight: 77.3 kg 77.5 kg 72.1 kg    Examination:  General exam: Appears comfortable  Respiratory system: clear breath sounds b/l  Cardiovascular system: S1/S2+. No rubs or clicks Gastrointestinal system: Abd is soft, NT, ND & hypoactive bowel sounds Central nervous system: Alert & oriented. Moves all extremities  Psychiatry: Judgement and insight appears poor. Flat mood and affect    Data Reviewed: I have personally reviewed following labs and imaging studies  CBC: Recent Labs  Lab 07/23/23 1059 07/23/23 1836 07/24/23 0350 07/25/23 0434  WBC 5.6 6.0 6.1 5.8  NEUTROABS  --  3.4  --   --   HGB 10.9* 10.0* 9.2* 9.1*  HCT 35.1* 30.6* 28.7* 27.7*  MCV 93.4 89.2 90.3 88.2  PLT 150 167 151 153   Basic Metabolic Panel: Recent Labs  Lab 07/23/23 1059 07/23/23 1836 07/24/23 0350 07/25/23 0434  NA 140 139 138 142  K 4.8 4.8 3.1* 3.5  CL 107 106 105 109  CO2 22 25 27 25   GLUCOSE 86 117* 94 89  BUN 17 17 14 13   CREATININE 1.01  1.13 0.94 0.81  CALCIUM  9.3 9.2 9.0 9.0   GFR: Estimated Creatinine Clearance: 85.3 mL/min (by C-G formula based on SCr of 0.81 mg/dL). Liver Function Tests: Recent Labs  Lab 07/23/23 1836  AST 27  ALT 14  ALKPHOS 59  BILITOT 0.8  PROT 7.1  ALBUMIN 3.7   No results for input(s): "LIPASE", "AMYLASE" in the last 168 hours. No results for input(s): "AMMONIA" in the last 168 hours. Coagulation Profile: Recent Labs  Lab 07/23/23 1959  INR 1.2   Cardiac Enzymes: No results for input(s): "CKTOTAL", "CKMB", "CKMBINDEX", "TROPONINI" in the last 168 hours. BNP (last 3 results) No results for input(s): "PROBNP" in the last 8760 hours. HbA1C: No results for input(s): "HGBA1C" in the last 72  hours. CBG: Recent Labs  Lab 07/23/23 2355 07/24/23 0154  GLUCAP 82 104*   Lipid Profile: No results for input(s): "CHOL", "HDL", "LDLCALC", "TRIG", "CHOLHDL", "LDLDIRECT" in the last 72 hours. Thyroid Function Tests: No results for input(s): "TSH", "T4TOTAL", "FREET4", "T3FREE", "THYROIDAB" in the last 72 hours. Anemia Panel: No results for input(s): "VITAMINB12", "FOLATE", "FERRITIN", "TIBC", "IRON ", "RETICCTPCT" in the last 72 hours. Sepsis Labs: No results for input(s): "PROCALCITON", "LATICACIDVEN" in the last 168 hours.  Recent Results (from the past 240 hours)  MRSA Next Gen by PCR, Nasal     Status: None   Collection Time: 07/24/23  2:51 AM   Specimen: Nasal Mucosa; Nasal Swab  Result Value Ref Range Status   MRSA by PCR Next Gen NOT DETECTED NOT DETECTED Final    Comment: (NOTE) The GeneXpert MRSA Assay (FDA approved for NASAL specimens only), is one component of a comprehensive MRSA colonization surveillance program. It is not intended to diagnose MRSA infection nor to guide or monitor treatment for MRSA infections. Test performance is not FDA approved in patients less than 24 years old. Performed at [facility], [address], [place], [place] [address]          Radiology Studies: DG Chest 1 View Result Date: 07/24/2023 CLINICAL DATA:  Aspiration. EXAM: CHEST  1 VIEW COMPARISON:  06/12/2023, additional priors reviewed FINDINGS: Lung volumes are low. Stable heart size and mediastinal contours allowing for differences in technique. Minor atelectasis at the left lung base. No pulmonary edema, pleural effusion, or pneumothorax. Chronic sclerosis within right lateral lower ribs. IMPRESSION: Low lung volumes with minor left basilar atelectasis. Electronically Signed   By: Chadwick Colonel M.D.   On: 07/24/2023 16:03   Rapid EEG Result Date: 07/24/2023 Arleene Lack, MD     07/24/2023 10:53 AM Patient Name: John Spears MRN: [id number] Epilepsy  Attending: Arleene Lack Referring Physician/Provider: Gideon Kussmaul, NP Duration: 07/24/2023 0211 to 07/24/2023 0912 Patient history: 73 y.o. male with medical history significant of seizure, HTN, H LD, dCHF, stroke, A fib on Xarelto , BPH, who presents with tremor. EEG to evaluate for seizure Level of alertness: asleep/ lethargic AEDs during EEG study: LEV, LCM, GBP, Ativan  Technical aspects: This EEG was obtained using a 10 lead EEG system positioned circumferentially without any parasagittal coverage (rapid EEG). Computer selected EEG is reviewed as  well as background features and all clinically significant events. Description: Sleep was characterized by sleep spindles (12-14hz ), maximal fronto-central region.  EEG showed continuous generalized and lateralized left hemisphere 3 to 6 Hz theta-delta slowing with overriding 15 to 18 Hz beta activity distributed symmetrically and diffusely. Hyperventilation and photic stimulation were not performed.  Of note, parts of study were technically difficult due  to significant electrode artifact.    ABNORMALITY - Continuous slow, generalized and lateralized left hemisphere  IMPRESSION: This limited ceribell study is suggestive of cortical dysfunction arising from left hemisphere likely secondary to underlying structural abnormality, post-ictal state. Additionally there is moderate to severe diffuse encephalopathy. No seizures or epileptiform discharges were seen throughout the recording.  Priyanka Suzanne Erps    CT HEAD WO CONTRAST ( ) Result Date: 07/23/2023 CLINICAL DATA:  Altered mental status, right facial droop EXAM: CT HEAD WITHOUT CONTRAST TECHNIQUE: Contiguous axial images were obtained from the base of the skull through the vertex without intravenous contrast. RADIATION DOSE REDUCTION: This exam was performed according to the departmental dose-optimization program which includes automated exposure control, adjustment of the mA and/or kV according to  patient size and/or use of iterative reconstruction technique. COMPARISON:  06/12/2023 FINDINGS: Brain: Normal anatomic configuration. Parenchymal volume loss is commensurate with the patient's age. Stable mild periventricular white matter changes are present likely reflecting the sequela of small vessel ischemia. Remote left parietal cortical infarct again noted. No abnormal intra or extra-axial mass lesion or fluid collection. No abnormal mass effect or midline shift. No evidence of acute intracranial hemorrhage or infarct. Ventricular size is normal. Cerebellum unremarkable. Vascular: No asymmetric hyperdense vasculature at the skull base. Skull: Intact Sinuses/Orbits: Paranasal sinuses are clear. Orbits are unremarkable. Other: Mastoid air cells and middle ear cavities are clear. IMPRESSION: 1. No evidence of acute intracranial hemorrhage or infarct. 2. Stable remote left parietal cortical infarct. 3. Stable mild periventricular white matter changes likely reflecting the sequela of small vessel ischemia. Electronically Signed   By: Worthy Heads M.D.   On: 07/23/2023 20:46        Scheduled Meds:  amLODipine   10 mg Oral QHS   aspirin  EC  81 mg Oral Daily   atorvastatin   40 mg Oral QHS   carvedilol   12.5 mg Oral BID WC   Chlorhexidine  Gluconate Cloth  6 each Topical Daily   cyanocobalamin   1,000 mcg Oral Daily   folic acid   1 mg Oral Daily   gabapentin  100 mg Oral BID   levETIRAcetam   1,500 mg Intravenous Q12H   LORazepam   0-4 mg Intravenous Q6H   Followed by   Cecily Cohen ON 07/26/2023] LORazepam   0-4 mg Intravenous Q12H   multivitamin with minerals  1 tablet Oral Daily   tamsulosin   0.4 mg Oral QHS   thiamine   100 mg Oral Daily   Or   thiamine   100 mg Intravenous Daily   Continuous Infusions:  lacosamide  (VIMPAT ) IV 150 mg (07/24/23 2135)     LOS: 1 day     Alphonsus Jeans, MD [facility] Pager [phone]  If 7PM-7AM, please contact  night-coverage [email or web address] 07/25/2023, 8:24 AM

## 2023-07-25 NOTE — Evaluation (Signed)
 Physical Therapy Evaluation Patient Details Name: John Spears MRN: [id number] DOB: [birth year] Today's Date: 07/25/2023  History of Present Illness  John Spears is a 73 y.o. male with medical history significant of seizure, HTN, H LD, dCHF, stroke, A fib on Xarelto , BPH, who presents with tremor, Pt was seen in ED this AM, and had negative work up. Pt was discharged home and instructed to return to his normal Keppra  dosing. After went home, pt developed right hand shaking, right facial droop and became unresponsive per her sister. Pt was brought back to ED again. 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  2. Stable remote left parietal cortical infarct.  3. Stable mild periventricular white matter changes likely  reflecting the sequela of small vessel ischemia.   Clinical Impression  Patient alert, agreeable to PT, oriented x3, denied pain. With some tremor noted with RUE movement, did improve over time. He endorsed at baseline he lives with his sister who performs IADLs, and he is normally ambulatory without AD (homebound). He was able to perform supine to sit with supervision. Sit <> stand with RW and CGA, verbal cues for proper hand placement. He was motivated to ambulate as much as possible, close chair follow for safety, supervision. PT noted fatigue ~122ft, instructed pt in seated rest break. Returned to room with needs in reach. The patient would benefit from further skilled PT intervention to maximize safety, mobility, and education.         If plan is discharge home, recommend the following: Assistance with cooking/housework;Assist for transportation;Help with stairs or ramp for entrance   Can travel by private vehicle        Equipment Recommendations  (Pt reported having RW at home)   Recommendations for Other Services       Functional Status Assessment Patient has had a recent decline in their functional status and demonstrates the ability to make significant improvements in function in a reasonable and predictable amount of time.     Precautions / Restrictions Precautions Precautions: Fall Restrictions Weight Bearing Restrictions Per Provider Order: No      Mobility  Bed Mobility Overal bed mobility: Needs Assistance Bed Mobility: Supine to Sit     Supine to sit: Supervision, HOB elevated          Transfers Overall transfer level: Needs assistance Equipment used: Rolling walker (2 wheels) Transfers: Sit to/from Stand Sit to Stand: Contact guard assist           General transfer comment: cues for hand placement    Ambulation/Gait Ambulation/Gait assistance: Supervision Gait Distance (Feet): 110 Feet Assistive device: Rolling walker (2 wheels)         General Gait Details: close chair follow, decreased stride length, noted for fatigue and PT enforced seated rest break, no true unsteadiness noted  Stairs            Wheelchair Mobility     Tilt Bed    Modified Rankin (Stroke Patients Only)       Balance Overall balance assessment: Needs assistance Sitting-balance support: Feet supported Sitting balance-Leahy Scale: Good     Standing balance support: During functional activity, Bilateral upper extremity supported Standing balance-Leahy Scale: Fair  Pertinent Vitals/Pain Pain Assessment Pain Assessment: No/denies pain    Home Living Family/patient expects to be discharged to:: Private residence Living Arrangements: Other relatives (sister) Available Help at Discharge: Family;Available 24 hours/day Type of Home: House Home Access: Stairs to enter Entrance Stairs-Rails: Right Entrance Stairs-Number of Steps: 3   Home Layout: One level Home Equipment: Shower seat;Grab bars  - tub/shower;Rolling Environmental consultant (2 wheels)      Prior Function Prior Level of Function : Needs assist             Mobility Comments: pt denied use of AD at home ADLs Comments: sister assists with groceries, cleaning, laundry and meals     Extremity/Trunk Assessment   Upper Extremity Assessment Upper Extremity Assessment: Overall WFL for tasks assessed    Lower Extremity Assessment Lower Extremity Assessment: Generalized weakness (able to move BLE against gravity, march, LAQ, heel/toe raise)    Cervical / Trunk Assessment Cervical / Trunk Assessment:  (noted for some RUE tremors with mobility but improved with time)  Communication        Cognition Arousal: Alert Behavior During Therapy: WFL for tasks assessed/performed   PT - Cognitive impairments: No apparent impairments                       PT - Cognition Comments: oriented to self, place, situation Following commands: Intact       Cueing Cueing Techniques: Verbal cues     General Comments      Exercises     Assessment/Plan    PT Assessment Patient needs continued PT services  PT Problem List Decreased activity tolerance;Decreased balance;Decreased mobility;Decreased strength;Decreased knowledge of use of DME       PT Treatment Interventions DME instruction;Balance training;Gait training;Neuromuscular re-education;Stair training;Functional mobility training;Patient/family education;Therapeutic activities;Therapeutic exercise    PT Goals (Current goals can be found in the Care Plan section)  Acute Rehab PT Goals Patient Stated Goal: to go home PT Goal Formulation: With patient Time For Goal Achievement: 08/08/23 Potential to Achieve Goals: Good    Frequency Min 2X/week     Co-evaluation               AM-PAC PT "6 Clicks" Mobility  Outcome Measure Help needed turning from your back to your side while in a flat bed without using bedrails?: None Help needed moving from lying on your  back to sitting on the side of a flat bed without using bedrails?: None Help needed moving to and from a bed to a chair (including a wheelchair)?: None Help needed standing up from a chair using your arms (e.g., wheelchair or bedside chair)?: None Help needed to walk in hospital room?: A Little Help needed climbing 3-5 steps with a railing? : A Little 6 Click Score: 22    End of Session Equipment Utilized During Treatment: Gait belt Activity Tolerance: Patient tolerated treatment well Patient left: in chair;with call bell/phone within reach;with chair alarm set Nurse Communication: Mobility status PT Visit Diagnosis: Other abnormalities of gait and mobility (R26.89);Difficulty in walking, not elsewhere classified (R26.2);Muscle weakness (generalized) (M62.81)    Time: 4098-1191 PT Time Calculation (min) (ACUTE ONLY): 21 min   Charges:   PT Evaluation $PT Eval Low Complexity: 1 Low PT Treatments $Therapeutic Activity: 8-22 mins PT General Charges $$ ACUTE PT VISIT: 1 Visit        Darien Eden PT, DPT 12:53 PM,07/25/23

## 2023-07-25 NOTE — Plan of Care (Signed)

## 2023-07-26 ENCOUNTER — Inpatient Hospital Stay

## 2023-07-26 DIAGNOSIS — R569 Unspecified convulsions: Secondary | ICD-10-CM | POA: Diagnosis not present

## 2023-07-26 LAB — BASIC METABOLIC PANEL WITH GFR
Anion gap: 11 (ref 5–15)
BUN: 20 mg/dL (ref 8–23)
CO2: 22 mmol/L (ref 22–32)
Calcium: 9.6 mg/dL (ref 8.9–10.3)
Chloride: 105 mmol/L (ref 98–111)
Creatinine, Ser: 1.12 mg/dL (ref 0.61–1.24)
GFR, Estimated: >60 mL/min (ref >60)
Glucose, Bld: 99 mg/dL (ref 70–99)
Potassium: 3.7 mmol/L (ref 3.5–5.1)
Sodium: 138 mmol/L (ref 135–145)

## 2023-07-26 LAB — CBC
HCT: 31.1 % — ABNORMAL LOW (ref 39.0–52.0)
Hemoglobin: 10 g/dL — ABNORMAL LOW (ref 13.0–17.0)
MCH: 28.7 pg (ref 26.0–34.0)
MCHC: 32.2 g/dL (ref 30.0–36.0)
MCV: 89.1 fL (ref 80.0–100.0)
Platelets: 180 10*3/uL (ref 150–400)
RBC: 3.49 MIL/uL — ABNORMAL LOW (ref 4.22–5.81)
RDW: 15.3 % (ref 11.5–15.5)
WBC: 10.2 10*3/uL (ref 4.0–10.5)
nRBC: 0 % (ref 0.0–0.2)

## 2023-07-26 LAB — LEVETIRACETAM LEVEL: Levetiracetam Lvl: 66.5 ug/mL — ABNORMAL HIGH (ref 10.0–40.0)

## 2023-07-26 MED ORDER — ACETAMINOPHEN 325 MG PO TABS
650.0000 mg | ORAL_TABLET | Freq: Four times a day (QID) | ORAL | Status: DC | PRN
  Administered 2023-07-26 – 2023-07-29 (×6): 650 mg via ORAL
  Filled 2023-07-26 (×6): qty 2

## 2023-07-26 NOTE — Evaluation (Addendum)
 Occupational Therapy Evaluation Patient Details Name: John Spears MRN: [id number] DOB: 1950-11-27 Today's Date: 07/26/2023   History of Present Illness   John Spears is a 73 y.o. male with medical history significant of seizure, HTN, H LD, dCHF, stroke, A fib on Xarelto , BPH, who presents with tremor, Pt was seen in ED this AM, and had negative work up. Pt was discharged home and instructed to return to his normal Keppra  dosing. After went home, pt developed right hand shaking, right facial droop and became unresponsive per her sister. Pt was brought back to ED again. 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  2. Stable remote left parietal cortical infarct.  3. Stable mild periventricular white matter changes likely  reflecting the sequela of small vessel ischemia.   Clinical Impressions Pt was seen for OT evaluation this date. Prior to hospital admission, pt was indep with ADL and in-home mobility. Pt lives with his sister who assists with IADL at home. Pt presents to acute OT demonstrating impaired ADL performance and functional mobility 2/2 impaired strength, balance, activity tolerance, and safety/deficits awareness (See OT problem list for additional functional deficits). Pt currently requires CGA-MIN A for bed mobility, CGA for STS with RW, and CGA-MIN A for ADL mobility (Pt ambulated ~40' + 40' with RW, 2 slight LOB, assist for RW mgt to keep pt in the middle of the RW). Pt requires MIN-MOD A for LB ADL tasks. Pt perseverative on cold lunch tray reporting it was from yesterday. Receipt noted today's date. Message sent to RN. Sister in at end of session, noting that pt's mobility appears to be worse than baseline. No tremors noted during session but balance and ADL mobility appear worse than previous  date's PT evaluation. Pt would benefit from skilled OT services to address noted impairments and functional limitations (see below for any additional details) in order to maximize safety and independence while minimizing falls risk and caregiver burden. Anticipate the need for follow up OT services upon acute hospital DC.    If plan is discharge home, recommend the following:   A little help with walking and/or transfers;A little help with bathing/dressing/bathroom;Assistance with cooking/housework;Assist for transportation;Help with stairs or ramp for entrance;Direct supervision/assist for medications management;Direct supervision/assist for financial management;Supervision due to cognitive status     Functional Status Assessment   Patient has had a recent decline in their functional status and demonstrates the ability to make significant improvements in function in a reasonable and predictable amount of time.     Equipment Recommendations   BSC/3in1     Recommendations for Other Services         Precautions/Restrictions   Precautions Precautions: Fall Recall of Precautions/Restrictions: Impaired Restrictions Weight Bearing Restrictions Per Provider Order: No     Mobility Bed Mobility Overal bed mobility: Needs Assistance Bed Mobility: Supine to Sit, Sit to Supine     Supine to sit: Supervision, HOB elevated Sit to supine: Min assist   General bed mobility comments: MIN A for BLE mgt back to bed    Transfers Overall transfer level: Needs assistance Equipment used: Rolling walker (2 wheels) Transfers: Sit to/from Stand Sit to Stand: Contact guard assist                  Balance Overall balance assessment: Needs assistance Sitting-balance support: Feet supported  Sitting balance-Leahy Scale: Fair     Standing balance support: During functional activity, Bilateral upper extremity supported, Reliant on assistive device for balance Standing  balance-Leahy Scale: Poor Standing balance comment: tendency to walk on L side of walker, causing L foot to occasionally hit the RW, required CGA-MIN A for balance while walking and assist to maintain more optimal RW positioning, very shuffled gait and narrow BOS                           ADL either performed or assessed with clinical judgement   ADL Overall ADL's : Needs assistance/impaired     Grooming: Sitting;Set up               Lower Body Dressing: Sit to/from stand;Moderate assistance   Toilet Transfer: Contact guard assist;Minimal assistance;Rolling walker (2 wheels);Cueing for safety                   Vision         Perception         Praxis         Pertinent Vitals/Pain Pain Assessment Pain Assessment: No/denies pain     Extremity/Trunk Assessment Upper Extremity Assessment Upper Extremity Assessment: Generalized weakness   Lower Extremity Assessment Lower Extremity Assessment: Generalized weakness       Communication Communication Communication: Impaired Factors Affecting Communication: Difficulty expressing self;Reduced clarity of speech   Cognition Arousal: Alert Behavior During Therapy: Flat affect Cognition: No family/caregiver present to determine baseline             OT - Cognition Comments: Pt perseverative on cold lunch meal, several cues to redirect to task, decr awareness of deficits and safety                 Following commands: Impaired Following commands impaired: Follows one step commands with increased time, Follows multi-step commands inconsistently     Cueing  General Comments   Cueing Techniques: Verbal cues;Tactile cues      Exercises Other Exercises Other Exercises: Pt ambulated ~40' + 40' with RW, 2 slight LOB, and CGA-MIN A for balance and RW mgt   Shoulder Instructions      Home Living Family/patient expects to be discharged to:: Private residence Living Arrangements: Other  relatives (sister) Available Help at Discharge: Family;Available 24 hours/day Type of Home: House Home Access: Stairs to enter [employer] of Steps: 3 Entrance Stairs-Rails: Right Home Layout: One level     Bathroom Shower/Tub: Producer, television/film/video: Standard Bathroom Accessibility: Yes   Home Equipment: Shower seat;Grab bars - tub/shower;Rolling Environmental consultant (2 wheels)          Prior Functioning/Environment Prior Level of Function : Needs assist             Mobility Comments: pt denied use of AD at home ADLs Comments: sister assists with groceries, cleaning, laundry and meals    OT Problem List: Decreased strength;Decreased coordination;Decreased activity tolerance;Decreased safety awareness;Decreased knowledge of use of DME or AE;Impaired balance (sitting and/or standing)   OT Treatment/Interventions: Self-care/ADL training;Therapeutic exercise;Therapeutic activities;DME and/or AE instruction;Patient/family education;Balance training      OT Goals(Current goals can be found in the care plan section)   Acute Rehab OT Goals Patient Stated Goal: get better OT Goal Formulation: With patient Time For Goal Achievement: 08/09/23 Potential to Achieve Goals: Good ADL Goals Pt Will Perform Lower Body Dressing: sit to/from stand;with modified independence Pt Will Transfer  to Toilet: ambulating;with modified independence (LRAD) Pt Will Perform Toileting - Clothing Manipulation and hygiene: with modified independence;sitting/lateral leans;sit to/from stand Additional ADL Goal #1: Pt will complete all aspects of bathing, primarily from seated position, with remote supv for safety 1/1 opportunity.   OT Frequency:  Min 2X/week    Co-evaluation              AM-PAC OT "6 Clicks" Daily Activity     Outcome Measure Help from another person eating meals?: None Help from another person taking care of personal grooming?: A Little Help from another person  toileting, which includes using toliet, bedpan, or urinal?: A Little Help from another person bathing (including washing, rinsing, drying)?: A Lot Help from another person to put on and taking off regular upper body clothing?: A Little Help from another person to put on and taking off regular lower body clothing?: A Lot 6 Click Score: 17   End of Session Equipment Utilized During Treatment: Rolling walker (2 wheels);Gait belt Nurse Communication: Other (comment) (Pt wanting warm food)  Activity Tolerance: Patient tolerated treatment well Patient left: in bed;with call bell/phone within reach;with bed alarm set;with family/visitor present  OT Visit Diagnosis: Other abnormalities of gait and mobility (R26.89);Muscle weakness (generalized) (M62.81)                Time: 1541-1600 OT Time Calculation (min): 19 min Charges:  OT General Charges $OT Visit: 1 Visit OT Evaluation $OT Eval Low Complexity: 1 Low OT Treatments $Therapeutic Activity: 8-22 mins  Berenda Breaker., MPH, MS, OTR/L ascom [phone] 07/26/23, 4:23 PM

## 2023-07-26 NOTE — Progress Notes (Signed)
 Eeg done

## 2023-07-26 NOTE — Plan of Care (Signed)

## 2023-07-26 NOTE — Progress Notes (Signed)
 Physical Therapy Treatment Patient Details Name: John Spears MRN: [id number] DOB: [birth year] Today's Date: 07/26/2023   History of Present Illness John Spears is a 73 y.o. male with medical history significant of seizure, HTN, H LD, dCHF, stroke, A fib on Xarelto , BPH, who presents with tremor, Pt was seen in ED this AM, and had negative work up. Pt was discharged home and instructed to return to his normal Keppra  dosing. After went home, pt developed right hand shaking, right facial droop and became unresponsive per her sister. Pt was brought back to ED again. 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  2. Stable remote left parietal cortical infarct.  3. Stable mild periventricular white matter changes likely  reflecting the sequela of small vessel ischemia.    PT Comments  Pt seen briefly this pm when found at foot of bed gown and cardiac monitor self removed and attempting to exit without assist. ModA to stand from bed several times and utilize RW to side step up towards head of bed. ModA for safety, high fall risk, unable to attain full upright standing due to decreased motor control throughout. Nursing in to assist with safe transition back to bed with all needs in reach. Pt appears slightly more confused this date with difficulty processing and requiring increased physical assist for mobility. Will continue to progress per POC and adjust d/c recs as appropriate.    If plan is discharge home, recommend the following: Assistance with cooking/housework;Assist for transportation;Help with stairs or ramp for entrance   Can travel by private vehicle        Equipment Recommendations  Other (comment) (TBD)    Recommendations for Other Services       Precautions / Restrictions  Precautions Precautions: Fall Recall of Precautions/Restrictions: Impaired Restrictions Weight Bearing Restrictions Per Provider Order: No     Mobility  Bed Mobility                    Transfers                        Ambulation/Gait Ambulation/Gait assistance: Min assist, Mod assist Gait Distance (Feet): 4 Feet Assistive device: Rolling walker (2 wheels) Gait Pattern/deviations: Step-to pattern Gait velocity: decr     General Gait Details: Side stepping towards head of bed, no "true gait" this session   Stairs             Wheelchair Mobility     Tilt Bed    Modified Rankin (Stroke Patients Only)       Balance                                            Communication Communication Communication: Impaired Factors Affecting Communication: Difficulty expressing self;Reduced clarity of speech  Cognition Arousal: Alert Behavior During Therapy: Anxious   PT - Cognitive impairments: No family/caregiver present to determine baseline                       PT - Cognition Comments: Oriented to self Following commands: Impaired Following commands impaired: Only follows one step commands consistently    Cueing Cueing Techniques: Verbal cues, Tactile cues  Exercises      General Comments General comments (skin integrity, edema, etc.):  (Pt slightly more confused and requiring increased physical assist for mobility this date)      Pertinent Vitals/Pain Pain Assessment Pain Assessment: No/denies pain    Home Living Family/patient expects to be discharged to:: Private residence Living Arrangements: Other relatives (sister) Available Help at Discharge: Family;Available 24 hours/day Type of Home: House Home Access: Stairs to enter Entrance Stairs-Rails: Right Entrance Stairs-Number of Steps: 3   Home Layout: One level Home Equipment: Shower seat;Grab bars - tub/shower;Rolling Environmental consultant (2 wheels)      Prior  Function            PT Goals (current goals can now be found in the care plan section) Acute Rehab PT Goals Patient Stated Goal: to go home    Frequency    Min 2X/week      PT Plan      Co-evaluation              AM-PAC PT "6 Clicks" Mobility   Outcome Measure  Help needed turning from your back to your side while in a flat bed without using bedrails?: None Help needed moving from lying on your back to sitting on the side of a flat bed without using bedrails?: A Little Help needed moving to and from a bed to a chair (including a wheelchair)?: A Lot Help needed standing up from a chair using your arms (e.g., wheelchair or bedside chair)?: A Lot Help needed to walk in hospital room?: A Lot Help needed climbing 3-5 steps with a railing? : A Lot 6 Click Score: 15    End of Session Equipment Utilized During Treatment: Gait belt Activity Tolerance: Patient tolerated treatment well Patient left: in bed;with call bell/phone within reach;with bed alarm set;with nursing/sitter in room Nurse Communication: Mobility status PT Visit Diagnosis: Other abnormalities of gait and mobility (R26.89);Difficulty in walking, not elsewhere classified (R26.2);Muscle weakness (generalized) (M62.81)     Time: 1520-1530 PT Time Calculation (min) (ACUTE ONLY): 10 min  Charges:    $Therapeutic Activity: 8-22 mins PT General Charges $$ ACUTE PT VISIT: 1 Visit                    Melvyn Stagers, PTA  Diona Franklin 07/26/2023, 4:36 PM

## 2023-07-26 NOTE — Progress Notes (Signed)
 The patient has been admitted for Seizure activity.  at 0342. The lab person was in the room with him.  She states that he was having tremors while she was attempting to draw the labs. She did complete the lab draw.  The patient is having gross tremors. Bilateral arms, jaw and head involved.  He is awake, alert, able to say his name, request the urinal, track my movements. When asked does he know where he is he says, "yes hospital."  At 0400 I gave him 2 mg of Ativan  IV. At 0406 now fine tremors in bilateral arms, head, jaw.. The patient never lost consciousness or became confused.  Using the urinal successfully. No complaints of pain.

## 2023-07-26 NOTE — Progress Notes (Signed)
 OT Cancellation Note  Patient Details Name: John Spears MRN: [id number] DOB: 1950-05-07   Cancelled Treatment:    Reason Eval/Treat Not Completed: Medical issues which prohibited therapy. Consult received, chart reviewed. Pt noted with fever and elevated BP this am. Will hold evaluation early this morning and attempt at later time today as medically appropriate.   [name], MPH, MS, OTR/L ascom [phone] 07/26/23, 8:21 AM

## 2023-07-26 NOTE — TOC CM/SW Note (Signed)
 Went by room to discuss home health recommendations. Patient off the unit. Will try again later.  Duke Gibbons, CSW [phone]

## 2023-07-26 NOTE — Progress Notes (Signed)
 PROGRESS NOTE    John Spears  ZOX:[id number] DOB: Jun 02, 1950 DOA: 07/23/2023 PCP: Patient, No Pcp Per   Assessment & Plan:   Principal Problem:   Seizure (HCC) Active Problems:   History of CVA (cerebrovascular accident)   Toxic metabolic encephalopathy   Paroxysmal atrial fibrillation (HCC)   Essential hypertension   HLD (hyperlipidemia)   Chronic diastolic CHF (congestive heart failure) (HCC)   BPH (benign prostatic hyperplasia)   Alcohol abuse   Seizures (HCC)  Assessment and Plan: Seizure: breakthrough. Secondary to noncompliance w/ meds. Continue vimpat , keppra . Continue w/ neuro checks. Neuro following and recs apprec. UA is unremarkable   Fever: of unknown etiology. UA is unremarkable. WBC is WNL currently. Blood cxs ordered. CXR ordered. Will hold off on abxs currently   Hypokalemia: WNL today    Hx of CVA: continue on aspirin , statin    PAF: continue on coreg . Hold xarelto  secondary to bleeding gum. Pt had several teeth pulled at the dentist and has had bleeding gums since that time as per pt's sister   HTN: continue on amlodipine , coreg    HLD: continue on statin    Chronic diastolic CHF: echo on 06/24/2022 showed EF 60 to 65% with grade 1 diastolic dysfunction.  Patient does not have leg edema or JVD.  Appears compensated. Holding lasix     BPH: continue on home dose of flomax     Alcohol abuse: continue on CIWA protocol   Normocytic anemia: H&H are stable. No need for a transfusion currently       DVT prophylaxis: SCDs Code Status: full  Family Communication:  Disposition Plan:likely d/c back home w/ HH   Level of care: Progressive  Status is: Inpatient Remains inpatient appropriate because: severity of illness, spiked a fever today. Will need to be fever free for 24 hrs prior to d/c      Consultants:  Neuro  ICU  Procedures:  Antimicrobials:  Subjective: Pt c/o malaise   Objective: Vitals:   07/26/23 0450 07/26/23 0559 07/26/23  0746 07/26/23 0853  BP: (!) 164/70  (!) 143/94   Pulse: 83  83   Resp: (!) 22     Temp: 98.2 F (36.8 C)  (!) 102.7 F (39.3 C) 98.4 F (36.9 C)  TempSrc: Oral   Oral  SpO2: 100%  97%   Weight:  80.1 kg    Height:        Intake/Output Summary (Last 24 hours) at 07/26/2023 0905 Last data filed at 07/26/2023 0732 Gross per 24 hour  Intake 720 ml  Output 251 ml  Net 469 ml   Filed Weights   07/24/23 0500 07/25/23 0500 07/26/23 0559  Weight: 77.5 kg 72.1 kg 80.1 kg    Examination:  General exam: Appears calm & comfortable  Respiratory system: decreased breath sounds b/l   Cardiovascular system: S1 & S2+. No rubs or clicks  Gastrointestinal system: abd is soft, NT, ND & hypoactive bowel sounds  Central nervous system: alert & awake. Moves all extremities  Psychiatry: Judgement and insight appears poor. Flat mood and affect    Data Reviewed: I have personally reviewed following labs and imaging studies  CBC: Recent Labs  Lab 07/23/23 1059 07/23/23 1836 07/24/23 0350 07/25/23 0434 07/26/23 0354  WBC 5.6 6.0 6.1 5.8 10.2  NEUTROABS  --  3.4  --   --   --   HGB 10.9* 10.0* 9.2* 9.1* 10.0*  HCT 35.1* 30.6* 28.7* 27.7* 31.1*  MCV 93.4 89.2 90.3 88.2 89.1  PLT 150 167 151 153 180   Basic Metabolic Panel: Recent Labs  Lab 07/23/23 1059 07/23/23 1836 07/24/23 0350 07/25/23 0434 07/26/23 0354  NA 140 139 138 142 138  K 4.8 4.8 3.1* 3.5 3.7  CL 107 106 105 109 105  CO2 22 25 27 25 22   GLUCOSE 86 117* 94 89 99  BUN 17 17 14 13 20   CREATININE 1.01 1.13 0.94 0.81 1.12  CALCIUM  9.3 9.2 9.0 9.0 9.6   GFR: Estimated Creatinine Clearance: 64.4 mL/min (by C-G formula based on SCr of 1.12 mg/dL). Liver Function Tests: Recent Labs  Lab 07/23/23 1836  AST 27  ALT 14  ALKPHOS 59  BILITOT 0.8  PROT 7.1  ALBUMIN 3.7   No results for input(s): "LIPASE", "AMYLASE" in the last 168 hours. No results for input(s): "AMMONIA" in the last 168 hours. Coagulation  Profile: Recent Labs  Lab 07/23/23 1959  INR 1.2   Cardiac Enzymes: No results for input(s): "CKTOTAL", "CKMB", "CKMBINDEX", "TROPONINI" in the last 168 hours. BNP (last 3 results) No results for input(s): "PROBNP" in the last 8760 hours. HbA1C: No results for input(s): "HGBA1C" in the last 72 hours. CBG: Recent Labs  Lab 07/23/23 2355 07/24/23 0154  GLUCAP 82 104*   Lipid Profile: No results for input(s): "CHOL", "HDL", "LDLCALC", "TRIG", "CHOLHDL", "LDLDIRECT" in the last 72 hours. Thyroid Function Tests: No results for input(s): "TSH", "T4TOTAL", "FREET4", "T3FREE", "THYROIDAB" in the last 72 hours. Anemia Panel: No results for input(s): "VITAMINB12", "FOLATE", "FERRITIN", "TIBC", "IRON ", "RETICCTPCT" in the last 72 hours. Sepsis Labs: No results for input(s): "PROCALCITON", "LATICACIDVEN" in the last 168 hours.  Recent Results (from the past 240 hours)  MRSA Next Gen by PCR, Nasal     Status: None   Collection Time: 07/24/23  2:51 AM   Specimen: Nasal Mucosa; Nasal Swab  Result Value Ref Range Status   MRSA by PCR Next Gen NOT DETECTED NOT DETECTED Final    Comment: (NOTE) The GeneXpert MRSA Assay (FDA approved for NASAL specimens only), is one component of a comprehensive MRSA colonization surveillance program. It is not intended to diagnose MRSA infection nor to guide or monitor treatment for MRSA infections. Test performance is not FDA approved in patients less than 37 years old. Performed at [facility], [address] [place], [place] [address]          Radiology Studies: DG Chest 1 View Result Date: 07/24/2023 CLINICAL DATA:  Aspiration. EXAM: CHEST  1 VIEW COMPARISON:  06/12/2023, additional priors reviewed FINDINGS: Lung volumes are low. Stable heart size and mediastinal contours allowing for differences in technique. Minor atelectasis at the left lung base. No pulmonary edema, pleural effusion, or pneumothorax. Chronic sclerosis within right  lateral lower ribs. IMPRESSION: Low lung volumes with minor left basilar atelectasis. Electronically Signed   By: Chadwick Colonel M.D.   On: 07/24/2023 16:03        Scheduled Meds:  amLODipine   10 mg Oral QHS   aspirin  EC  81 mg Oral Daily   atorvastatin   40 mg Oral QHS   carvedilol   12.5 mg Oral BID WC   cyanocobalamin   1,000 mcg Oral Daily   folic acid   1 mg Oral Daily   gabapentin  100 mg Oral BID   lacosamide   150 mg Oral BID   levETIRAcetam   1,500 mg Oral BID   LORazepam   0-4 mg Intravenous Q12H   multivitamin with minerals  1 tablet Oral Daily   tamsulosin   0.4 mg Oral QHS   thiamine   100 mg Oral Daily   Or   thiamine   100 mg Intravenous Daily   Continuous Infusions:     LOS: 2 days     Alphonsus Jeans, MD [facility] Pager [phone]  If 7PM-7AM, please contact night-coverage [email or web address] 07/26/2023, 9:05 AM

## 2023-07-26 NOTE — Care Management Important Message (Signed)
 Important Message  Patient Details  Name: John Spears MRN: [id number] Date of Birth: [birth year]   Important Message Given:  Yes - Medicare IM     Anise Kerns 07/26/2023, 12:59 PM

## 2023-07-26 NOTE — Procedures (Signed)
 Patient Name: John Spears  MRN: [id number]  Epilepsy Attending: Arleene Lack  Referring Physician/Provider: Niu, Xilin, MD  Date: 07/26/2023  Duration: 30.16 mins  Patient history: 73yo M s/p cardiac arrest. EEG to evaluate for seizure  Level of alertness: Asleep  AEDs during EEG study: GBP, LEV, LCM, Ativan   Technical aspects: This EEG study was done with scalp electrodes positioned according to the 10-20 International system of electrode placement. Electrical activity was reviewed with band pass filter of 1-70Hz , sensitivity of 7 uV/mm, display speed of 85mm/sec with a 60Hz  notched filter applied as appropriate. EEG data were recorded continuously and digitally stored.  Video monitoring was available and reviewed as appropriate.  Description: Sleep was characterized by vertex waves, sleep spindles (12 to 14 Hz), maximal frontocentral region.  Hyperventilation and photic stimulation were not performed.     Study was technically difficult due to significant sweat artifact.  IMPRESSION: This technically difficult study during sleep only is within normal limits. No seizures or epileptiform discharges were seen throughout the recording.  Mizani Dilday O Randolf Sansoucie

## 2023-07-27 DIAGNOSIS — R7881 Bacteremia: Secondary | ICD-10-CM | POA: Diagnosis not present

## 2023-07-27 DIAGNOSIS — R569 Unspecified convulsions: Secondary | ICD-10-CM | POA: Diagnosis not present

## 2023-07-27 LAB — CBC
HCT: 26.3 % — ABNORMAL LOW (ref 39.0–52.0)
Hemoglobin: 8.5 g/dL — ABNORMAL LOW (ref 13.0–17.0)
MCH: 28.8 pg (ref 26.0–34.0)
MCHC: 32.3 g/dL (ref 30.0–36.0)
MCV: 89.2 fL (ref 80.0–100.0)
Platelets: 147 10*3/uL — ABNORMAL LOW (ref 150–400)
RBC: 2.95 MIL/uL — ABNORMAL LOW (ref 4.22–5.81)
RDW: 14.9 % (ref 11.5–15.5)
WBC: 14.7 10*3/uL — ABNORMAL HIGH (ref 4.0–10.5)
nRBC: 0 % (ref 0.0–0.2)

## 2023-07-27 LAB — BASIC METABOLIC PANEL WITH GFR
Anion gap: 9 (ref 5–15)
BUN: 19 mg/dL (ref 8–23)
CO2: 24 mmol/L (ref 22–32)
Calcium: 8.8 mg/dL — ABNORMAL LOW (ref 8.9–10.3)
Chloride: 106 mmol/L (ref 98–111)
Creatinine, Ser: 1.02 mg/dL (ref 0.61–1.24)
GFR, Estimated: >60 mL/min (ref >60)
Glucose, Bld: 116 mg/dL — ABNORMAL HIGH (ref 70–99)
Potassium: 3.8 mmol/L (ref 3.5–5.1)
Sodium: 139 mmol/L (ref 135–145)

## 2023-07-27 LAB — BLOOD CULTURE ID PANEL (REFLEXED) - BCID2

## 2023-07-27 LAB — PROCALCITONIN: Procalcitonin: 8.84 ng/mL

## 2023-07-27 MED ORDER — LORAZEPAM 2 MG/ML IJ SOLN
0.0000 mg | Freq: Four times a day (QID) | INTRAMUSCULAR | Status: DC | PRN
  Administered 2023-07-27: 1 mg via INTRAVENOUS
  Administered 2023-07-28: 2 mg via INTRAVENOUS
  Filled 2023-07-27 (×3): qty 1

## 2023-07-27 MED ORDER — SODIUM CHLORIDE 0.9 % IV SOLN
2.0000 g | INTRAVENOUS | Status: DC
  Administered 2023-07-27 – 2023-07-31 (×5): 2 g via INTRAVENOUS
  Filled 2023-07-27 (×5): qty 20

## 2023-07-27 MED ORDER — ORAL CARE MOUTH RINSE: 15.0000 mL | OROMUCOSAL | Status: DC | PRN

## 2023-07-27 NOTE — TOC Progression Note (Signed)
 Transition of Care [facility]) - Progression Note    Patient Details  Name: John Spears MRN: [id number] Date of Birth: 04/28/1950  Transition of Care [facility]) CM/SW Contact  Odilia Bennett, LCSW Phone Number: 07/27/2023, 11:54 AM  Clinical Narrative:  Patient now only oriented to self. Will hold off on home health workup until improved.     Barriers to Discharge: Continued Medical Work up  Expected Discharge Plan and Services                                               Social Determinants of Health (SDOH) Interventions SDOH Screenings   Food Insecurity: Patient Unable To Answer (07/26/2023)  Housing: Low Risk  (04/06/2023)   Received from [facility]  Transportation Needs: Unmet Transportation Needs (04/06/2023)   Received from [facility]  Utilities: Not At Risk (04/06/2023)   Received from [facility]  Financial Resource Strain: Medium Risk (04/06/2023)   Received from [facility]  Social Connections: Unknown (03/12/2023)  Tobacco Use: Medium Risk (07/23/2023)    Readmission Risk Interventions     No data to display

## 2023-07-27 NOTE — Progress Notes (Signed)
 PROGRESS NOTE   HPI was taken from Dr. Rosalea Collin: John Spears is a 73 y.o. male with medical history significant of seizure, HTN, H LD, dCHF, stroke, A fib on Xarelto , BPH, who presents with tremor.   Per her sister (I called her sister by phone), pt has not been taking his Keppra  in the past two days due to dental procedure. Pt had 4 teeth pullout. His bottom gums are oozing blood. He still has oozing blood from dental surgery. Pt developed tremors this AM which often precede his seizure activity per her sister. Pt was seen in ED this AM, and had negative work up. Pt was discharged home and instructed to return to his normal Keppra  dosing. After went home, pt developed right hand shaking, right facial droop and became unresponsive per her sister. Pt was brought back to ED again.    When I saw patient in ED, patient is mildly confused.  He knows his own name, is orientated to place, but confused about year.  He moves all extremities, no facial droop or slurred speech on my examination.  Denies chest pain, cough, SOB.  No nausea, vomiting, diarrhea or abdominal pain.  No symptoms of UTI.  No fever or chills. Of  note, pt has Keppra  1500 mg twice daily and lacosamide  150 mg twice daily on his medication list, but per his sister, patient only takes Keppra  normally.   Addendum: after pt arrived to the floor, he started seizing. Pt was given 1500 mg of Keppra , 150 mg of Vimpt and total of 6 mg of Ativan  by IV, still seizing. Suspecting status epilepticus. I ordered another 1500 mg of Keppra  to be given. Consulted ICU NP, Ouma. Pt will be transferred to SDU.      Data reviewed independently and ED Course: pt was found to have WBC 6.0, GFR > 60, temperature normal, blood pressure 167/68, heart rate 75, RR 20 oxygen saturation 98% on room air.  CT of head is negative acute intracranial abnormalities, but showed a remote infarction.  Patient is placed in the PCU for observation.    John Spears   BJY:[id number] DOB: Mar 15, 1950 DOA: 07/23/2023 PCP: Patient, No Pcp Per   Assessment & Plan:   Principal Problem:   Seizure (HCC) Active Problems:   History of CVA (cerebrovascular accident)   Toxic metabolic encephalopathy   Paroxysmal atrial fibrillation (HCC)   Essential hypertension   HLD (hyperlipidemia)   Chronic diastolic CHF (congestive heart failure) (HCC)   BPH (benign prostatic hyperplasia)   Alcohol abuse   Seizures (HCC)  Assessment and Plan: Bacteremia: blood cxs growing e.coli. Source unclear, but will send urine cx but pt already started on IV rocephin.   Seizure: breakthrough. Secondary to noncompliance w/ meds. Continue on vimpat , keppra . Continue w/ neuro checks. Neuro recs apprec   Fever: secondary to bacteremia. UA is unremarkable but get a urine cx. WBC is elevated today. Started on IV rocephin   Hypokalemia: WNL today    Hx of CVA: continue on statin, aspirin     PAF: continue on coreg . Hold xarelto  secondary to bleeding gum. Pt had several teeth pulled at the dentist and has had bleeding gums since that time as per pt's sister   HTN: continue on amlodipine , coreg    HLD: continue on statin     Chronic diastolic CHF: echo on 06/24/2022 showed EF 60 to 65% with grade 1 diastolic dysfunction.  Patient does not have leg edema or JVD.  Appears compensated. Holding lasix   BPH: continue on home dose of flomax     Alcohol abuse: continue on CIWA protocol   Normocytic anemia: H&H are labile. Will transfuse if Hb < 7.0       DVT prophylaxis: SCDs Code Status: full  Family Communication : called pt's sister, Ninette Basque, no answer & I was unable to leave a voicemail  Disposition Plan:likely d/c back home w/ HH   Level of care: Progressive  Status is: Inpatient Remains inpatient appropriate because: severity of illness, requiring IV abxs for bacteremia, cxs pending currently & still spiking fevers      Consultants:  Neuro   ICU  Procedures:  Antimicrobials:  Subjective: Pt c/o fatigue   Objective: Vitals:   07/27/23 0155 07/27/23 0412 07/27/23 0500 07/27/23 0729  BP:  113/60  (!) 147/64  Pulse: 89 69  85  Resp:  16  16  Temp:  98.6 F (37 C)  98.5 F (36.9 C)  TempSrc:  Oral  Oral  SpO2:  100%  99%  Weight:   79.5 kg   Height:        Intake/Output Summary (Last 24 hours) at 07/27/2023 0845 Last data filed at 07/27/2023 0500 Gross per 24 hour  Intake 1180 ml  Output --  Net 1180 ml   Filed Weights   07/25/23 0500 07/26/23 0559 07/27/23 0500  Weight: 72.1 kg 80.1 kg 79.5 kg    Examination:  General exam: Appears uncomfortable  Respiratory system: diminished breath sounds b/l  Cardiovascular system: S1/S2+. No rubs or gallops  Gastrointestinal system: abd is soft, NT, ND & hypoactive bowel sounds Central nervous system:  oriented to self only. Moves all extremities  Psychiatry: Judgement and insight appears poor. Flat mood and affect     Data Reviewed: I have personally reviewed following labs and imaging studies  CBC: Recent Labs  Lab 07/23/23 1836 07/24/23 0350 07/25/23 0434 07/26/23 0354 07/27/23 0415  WBC 6.0 6.1 5.8 10.2 14.7*  NEUTROABS 3.4  --   --   --   --   HGB 10.0* 9.2* 9.1* 10.0* 8.5*  HCT 30.6* 28.7* 27.7* 31.1* 26.3*  MCV 89.2 90.3 88.2 89.1 89.2  PLT 167 151 153 180 147*   Basic Metabolic Panel: Recent Labs  Lab 07/23/23 1836 07/24/23 0350 07/25/23 0434 07/26/23 0354 07/27/23 0415  NA 139 138 142 138 139  K 4.8 3.1* 3.5 3.7 3.8  CL 106 105 109 105 106  CO2 25 27 25 22 24   GLUCOSE 117* 94 89 99 116*  BUN 17 14 13 20 19   CREATININE 1.13 0.94 0.81 1.12 1.02  CALCIUM  9.2 9.0 9.0 9.6 8.8*   GFR: Estimated Creatinine Clearance: 70.7 mL/min (by C-G formula based on SCr of 1.02 mg/dL). Liver Function Tests: Recent Labs  Lab 07/23/23 1836  AST 27  ALT 14  ALKPHOS 59  BILITOT 0.8  PROT 7.1  ALBUMIN 3.7   No results for input(s): "LIPASE",  "AMYLASE" in the last 168 hours. No results for input(s): "AMMONIA" in the last 168 hours. Coagulation Profile: Recent Labs  Lab 07/23/23 1959  INR 1.2   Cardiac Enzymes: No results for input(s): "CKTOTAL", "CKMB", "CKMBINDEX", "TROPONINI" in the last 168 hours. BNP (last 3 results) No results for input(s): "PROBNP" in the last 8760 hours. HbA1C: No results for input(s): "HGBA1C" in the last 72 hours. CBG: Recent Labs  Lab 07/23/23 2355 07/24/23 0154  GLUCAP 82 104*   Lipid Profile: No results for input(s): "CHOL", "HDL", "LDLCALC", "TRIG", "CHOLHDL", "LDLDIRECT"  in the last 72 hours. Thyroid Function Tests: No results for input(s): "TSH", "T4TOTAL", "FREET4", "T3FREE", "THYROIDAB" in the last 72 hours. Anemia Panel: No results for input(s): "VITAMINB12", "FOLATE", "FERRITIN", "TIBC", "IRON ", "RETICCTPCT" in the last 72 hours. Sepsis Labs: No results for input(s): "PROCALCITON", "LATICACIDVEN" in the last 168 hours.  Recent Results (from the past 240 hours)  MRSA Next Gen by PCR, Nasal     Status: None   Collection Time: 07/24/23  2:51 AM   Specimen: Nasal Mucosa; Nasal Swab  Result Value Ref Range Status   MRSA by PCR Next Gen NOT DETECTED NOT DETECTED Final    Comment: (NOTE) The GeneXpert MRSA Assay (FDA approved for NASAL specimens only), is one component of a comprehensive MRSA colonization surveillance program. It is not intended to diagnose MRSA infection nor to guide or monitor treatment for MRSA infections. Test performance is not FDA approved in patients less than 72 years old. Performed at [facility], [address] [place], [place] [address]   Culture, blood (Routine X 2) w Reflex to ID Panel     Status: None (Preliminary result)   Collection Time: 07/26/23 12:57 PM   Specimen: BLOOD  Result Value Ref Range Status   Specimen Description   Final    BLOOD BLOOD LEFT ARM Performed at [facility], [address], [place], [place]  [address]    Special Requests   Final    BOTTLES DRAWN AEROBIC AND ANAEROBIC Blood Culture adequate volume Performed at [facility], [address] [place], [place] [address]    Culture  Setup Time   Final    GRAM NEGATIVE RODS IN BOTH AEROBIC AND ANAEROBIC BOTTLES CRITICAL RESULT CALLED TO, READ BACK BY AND VERIFIED WITH:  JASON ROBINS AT 0123 07/27/23 JG Performed at [facility], [address] [place], [place] [address]    Culture GRAM NEGATIVE RODS  Final   Report Status PENDING  Incomplete  Blood Culture ID Panel (Reflexed)     Status: Abnormal   Collection Time: 07/26/23 12:57 PM  Result Value Ref Range Status   Enterococcus faecalis NOT DETECTED NOT DETECTED Final   Enterococcus Faecium NOT DETECTED NOT DETECTED Final   Listeria monocytogenes NOT DETECTED NOT DETECTED Final   Staphylococcus species NOT DETECTED NOT DETECTED Final   Staphylococcus aureus (BCID) NOT DETECTED NOT DETECTED Final   Staphylococcus epidermidis NOT DETECTED NOT DETECTED Final   Staphylococcus lugdunensis NOT DETECTED NOT DETECTED Final   Streptococcus species NOT DETECTED NOT DETECTED Final   Streptococcus agalactiae NOT DETECTED NOT DETECTED Final   Streptococcus pneumoniae NOT DETECTED NOT DETECTED Final   Streptococcus pyogenes NOT DETECTED NOT DETECTED Final   A.calcoaceticus-baumannii NOT DETECTED NOT DETECTED Final   Bacteroides fragilis NOT DETECTED NOT DETECTED Final   Enterobacterales DETECTED (A) NOT DETECTED Final    Comment: Enterobacterales represent a large order of gram negative bacteria, not a single organism. CRITICAL RESULT CALLED TO, READ BACK BY AND VERIFIED WITH:  JASON ROBINS AT 0123 07/27/23 JG    Enterobacter cloacae complex NOT DETECTED NOT DETECTED Final   Escherichia coli DETECTED (A) NOT DETECTED Final    Comment: CRITICAL RESULT CALLED TO, READ BACK BY AND VERIFIED WITH:  JASON ROBINS AT 0123 07/27/23 JG    Klebsiella aerogenes NOT DETECTED NOT  DETECTED Final   Klebsiella oxytoca NOT DETECTED NOT DETECTED Final   Klebsiella pneumoniae NOT DETECTED NOT DETECTED Final   Proteus species NOT DETECTED NOT DETECTED Final  Salmonella species NOT DETECTED NOT DETECTED Final   Serratia marcescens NOT DETECTED NOT DETECTED Final   Haemophilus influenzae NOT DETECTED NOT DETECTED Final   Neisseria meningitidis NOT DETECTED NOT DETECTED Final   Pseudomonas aeruginosa NOT DETECTED NOT DETECTED Final   Stenotrophomonas maltophilia NOT DETECTED NOT DETECTED Final   Candida albicans NOT DETECTED NOT DETECTED Final   Candida auris NOT DETECTED NOT DETECTED Final   Candida glabrata NOT DETECTED NOT DETECTED Final   Candida krusei NOT DETECTED NOT DETECTED Final   Candida parapsilosis NOT DETECTED NOT DETECTED Final   Candida tropicalis NOT DETECTED NOT DETECTED Final   Cryptococcus neoformans/gattii NOT DETECTED NOT DETECTED Final   CTX-M ESBL NOT DETECTED NOT DETECTED Final   Carbapenem resistance IMP NOT DETECTED NOT DETECTED Final   Carbapenem resistance KPC NOT DETECTED NOT DETECTED Final   Carbapenem resistance NDM NOT DETECTED NOT DETECTED Final   Carbapenem resist OXA 48 LIKE NOT DETECTED NOT DETECTED Final   Carbapenem resistance VIM NOT DETECTED NOT DETECTED Final    Comment: Performed at [facility], [address] [place], [place] [address]  Culture, blood (Routine X 2) w Reflex to ID Panel     Status: None (Preliminary result)   Collection Time: 07/26/23  1:03 PM   Specimen: BLOOD  Result Value Ref Range Status   Specimen Description BLOOD BLOOD RIGHT ARM  Final   Special Requests   Final    BOTTLES DRAWN AEROBIC AND ANAEROBIC Blood Culture adequate volume   Culture   Final    NO GROWTH < 24 HOURS Performed at [facility], [address], [place], [place] [address]    Report Status PENDING  Incomplete         Radiology Studies: DG Chest Port 1 View Result Date: 07/26/2023 CLINICAL DATA:  Fever  and altered mental status. EXAM: PORTABLE CHEST 1 VIEW COMPARISON:  07/24/2023 FINDINGS: Lung volumes are low. Minor atelectasis at the left lung base. No confluent airspace disease. Stable heart size and mediastinal contours. No pulmonary edema. No significant pleural effusion. No pneumothorax. IMPRESSION: Low lung volumes with minor left basilar atelectasis. Electronically Signed   By: Chadwick Colonel M.D.   On: 07/26/2023 18:09   EEG adult Result Date: 07/26/2023 Arleene Lack, MD     07/26/2023  1:11 PM Patient Name: Ova Meegan MRN: [id number] Epilepsy Attending: Arleene Lack Referring Physician/Provider: Niu, Xilin, MD Date: 07/26/2023 Duration: 30.16 mins Patient history: 73yo M s/p cardiac arrest. EEG to evaluate for seizure Level of alertness: Asleep AEDs during EEG study: GBP, LEV, LCM, Ativan  Technical aspects: This EEG study was done with scalp electrodes positioned according to the 10-20 International system of electrode placement. Electrical activity was reviewed with band pass filter of 1-70Hz , sensitivity of 7 uV/mm, display speed of 58mm/sec with a 60Hz  notched filter applied as appropriate. EEG data were recorded continuously and digitally stored.  Video monitoring was available and reviewed as appropriate. Description: Sleep was characterized by vertex waves, sleep spindles (12 to 14 Hz), maximal frontocentral region.  Hyperventilation and photic stimulation were not performed.   Study was technically difficult due to significant sweat artifact. IMPRESSION: This technically difficult study during sleep only is within normal limits. No seizures or epileptiform discharges were seen throughout the recording. Priyanka O Yadav        Scheduled Meds:  amLODipine   10 mg Oral QHS   aspirin  EC  81 mg Oral Daily   atorvastatin   40 mg  Oral QHS   carvedilol   12.5 mg Oral BID WC   cyanocobalamin   1,000 mcg Oral Daily   folic acid   1 mg Oral Daily   gabapentin  100 mg Oral BID    lacosamide   150 mg Oral BID   levETIRAcetam   1,500 mg Oral BID   LORazepam   0-4 mg Intravenous Q12H   multivitamin with minerals  1 tablet Oral Daily   tamsulosin   0.4 mg Oral QHS   thiamine   100 mg Oral Daily   Or   thiamine   100 mg Intravenous Daily   Continuous Infusions:  cefTRIAXone (ROCEPHIN)  IV 2 g (07/27/23 0325)      LOS: 3 days     Alphonsus Jeans, MD [facility] Pager [phone]  If 7PM-7AM, please contact night-coverage [email or web address] 07/27/2023, 8:45 AM

## 2023-07-27 NOTE — Plan of Care (Signed)

## 2023-07-27 NOTE — Plan of Care (Signed)
   Problem: Education: Goal: Knowledge of General Education information will improve Description Including pain rating scale, medication(s)/side effects and non-pharmacologic comfort measures Outcome: Progressing

## 2023-07-27 NOTE — Progress Notes (Signed)
 PT Cancellation Note  Patient Details Name: John Spears MRN: [id number] DOB: 20-Sep-1950   Cancelled Treatment:     Pt resting in bed, very lethargic, difficult to arouse. WBC trending up and currently febrile. Confusion has also increased over past few days. Will check back next available date per POC. May need to adjust d/c recs.   Diona Franklin 07/27/2023, 12:14 PM

## 2023-07-27 NOTE — Progress Notes (Signed)
 PHARMACY - PHYSICIAN COMMUNICATION CRITICAL VALUE ALERT - BLOOD CULTURE IDENTIFICATION (BCID)  John Spears is an 73 y.o. male who presented to [facility] on 07/23/2023 with a chief complaint of seizure  Assessment:  E Coli in 1 of 4 bottles, no resistance  (include suspected source if known)  Name of physician (or Provider) Contacted: Mansy  Current antibiotics: none   Changes to prescribed antibiotics recommended:  Recommendations accepted by provider - Will start Ceftriaxone 2 gm IV Q24H   Results for orders placed or performed during the hospital encounter of 07/23/23  Blood Culture ID Panel (Reflexed) (Collected: 07/26/2023 12:57 PM)  Result Value Ref Range   Enterococcus faecalis NOT DETECTED NOT DETECTED   Enterococcus Faecium NOT DETECTED NOT DETECTED   Listeria monocytogenes NOT DETECTED NOT DETECTED   Staphylococcus species NOT DETECTED NOT DETECTED   Staphylococcus aureus (BCID) NOT DETECTED NOT DETECTED   Staphylococcus epidermidis NOT DETECTED NOT DETECTED   Staphylococcus lugdunensis NOT DETECTED NOT DETECTED   Streptococcus species NOT DETECTED NOT DETECTED   Streptococcus agalactiae NOT DETECTED NOT DETECTED   Streptococcus pneumoniae NOT DETECTED NOT DETECTED   Streptococcus pyogenes NOT DETECTED NOT DETECTED   A.calcoaceticus-baumannii NOT DETECTED NOT DETECTED   Bacteroides fragilis NOT DETECTED NOT DETECTED   Enterobacterales DETECTED (A) NOT DETECTED   Enterobacter cloacae complex NOT DETECTED NOT DETECTED   Escherichia coli DETECTED (A) NOT DETECTED   Klebsiella aerogenes NOT DETECTED NOT DETECTED   Klebsiella oxytoca NOT DETECTED NOT DETECTED   Klebsiella pneumoniae NOT DETECTED NOT DETECTED   Proteus species NOT DETECTED NOT DETECTED   Salmonella species NOT DETECTED NOT DETECTED   Serratia marcescens NOT DETECTED NOT DETECTED   Haemophilus influenzae NOT DETECTED NOT DETECTED   Neisseria meningitidis NOT DETECTED NOT DETECTED   Pseudomonas  aeruginosa NOT DETECTED NOT DETECTED   Stenotrophomonas maltophilia NOT DETECTED NOT DETECTED   Candida albicans NOT DETECTED NOT DETECTED   Candida auris NOT DETECTED NOT DETECTED   Candida glabrata NOT DETECTED NOT DETECTED   Candida krusei NOT DETECTED NOT DETECTED   Candida parapsilosis NOT DETECTED NOT DETECTED   Candida tropicalis NOT DETECTED NOT DETECTED   Cryptococcus neoformans/gattii NOT DETECTED NOT DETECTED   CTX-M ESBL NOT DETECTED NOT DETECTED   Carbapenem resistance IMP NOT DETECTED NOT DETECTED   Carbapenem resistance KPC NOT DETECTED NOT DETECTED   Carbapenem resistance NDM NOT DETECTED NOT DETECTED   Carbapenem resist OXA 48 LIKE NOT DETECTED NOT DETECTED   Carbapenem resistance VIM NOT DETECTED NOT DETECTED    John Spears D 07/27/2023  1:40 AM

## 2023-07-27 NOTE — Progress Notes (Signed)
 OT Cancellation Note  Patient Details Name: John Spears MRN: [id number] DOB: 07-Mar-1950   Cancelled Treatment:    Reason Eval/Treat Not Completed: Other (comment). Upon attempt, pt with MD. Will re-attempt at later time as able.   Hy Swiatek R., MPH, MS, OTR/L ascom [phone] 07/27/23, 10:21 AM

## 2023-07-28 ENCOUNTER — Inpatient Hospital Stay

## 2023-07-28 DIAGNOSIS — R569 Unspecified convulsions: Secondary | ICD-10-CM | POA: Diagnosis not present

## 2023-07-28 LAB — BASIC METABOLIC PANEL WITH GFR
Anion gap: 8 (ref 5–15)
BUN: 20 mg/dL (ref 8–23)
CO2: 25 mmol/L (ref 22–32)
Calcium: 9 mg/dL (ref 8.9–10.3)
Chloride: 106 mmol/L (ref 98–111)
Creatinine, Ser: 1.02 mg/dL (ref 0.61–1.24)
GFR, Estimated: >60 mL/min (ref >60)
Glucose, Bld: 104 mg/dL — ABNORMAL HIGH (ref 70–99)
Potassium: 3.4 mmol/L — ABNORMAL LOW (ref 3.5–5.1)
Sodium: 139 mmol/L (ref 135–145)

## 2023-07-28 LAB — CBC
HCT: 28.5 % — ABNORMAL LOW (ref 39.0–52.0)
Hemoglobin: 9.2 g/dL — ABNORMAL LOW (ref 13.0–17.0)
MCH: 28.6 pg (ref 26.0–34.0)
MCHC: 32.3 g/dL (ref 30.0–36.0)
MCV: 88.5 fL (ref 80.0–100.0)
Platelets: 154 10*3/uL (ref 150–400)
RBC: 3.22 MIL/uL — ABNORMAL LOW (ref 4.22–5.81)
RDW: 15 % (ref 11.5–15.5)
WBC: 15.7 10*3/uL — ABNORMAL HIGH (ref 4.0–10.5)
nRBC: 0 % (ref 0.0–0.2)

## 2023-07-28 MED ORDER — IOHEXOL 9 MG/ML PO SOLN
500.0000 mL | ORAL | Status: AC
  Administered 2023-07-28 (×2): 500 mL via ORAL

## 2023-07-28 MED ORDER — POTASSIUM CHLORIDE 20 MEQ PO PACK
40.0000 meq | PACK | Freq: Once | ORAL | Status: AC
  Administered 2023-07-28: 40 meq via ORAL
  Filled 2023-07-28: qty 2

## 2023-07-28 MED ORDER — ENOXAPARIN SODIUM 40 MG/0.4ML IJ SOSY
40.0000 mg | PREFILLED_SYRINGE | INTRAMUSCULAR | Status: DC
  Administered 2023-07-28: 40 mg via SUBCUTANEOUS
  Filled 2023-07-28: qty 0.4

## 2023-07-28 MED ORDER — IOHEXOL 300 MG/ML  SOLN
100.0000 mL | Freq: Once | INTRAMUSCULAR | Status: AC | PRN
  Administered 2023-07-28: 100 mL via INTRAVENOUS

## 2023-07-28 NOTE — Progress Notes (Addendum)
 PROGRESS NOTE   HPI was taken from Dr. Rosalea Collin: John Spears is a 73 y.o. male with medical history significant of seizure, HTN, H LD, dCHF, stroke, A fib on Xarelto , BPH, who presents with tremor.   Per her sister (I called her sister by phone), pt has not been taking his Keppra  in the past two days due to dental procedure. Pt had 4 teeth pullout. His bottom gums are oozing blood. He still has oozing blood from dental surgery. Pt developed tremors this AM which often precede his seizure activity per her sister. Pt was seen in ED this AM, and had negative work up. Pt was discharged home and instructed to return to his normal Keppra  dosing. After went home, pt developed right hand shaking, right facial droop and became unresponsive per her sister. Pt was brought back to ED again.    When I saw patient in ED, patient is mildly confused.  He knows his own name, is orientated to place, but confused about year.  He moves all extremities, no facial droop or slurred speech on my examination.  Denies chest pain, cough, SOB.  No nausea, vomiting, diarrhea or abdominal pain.  No symptoms of UTI.  No fever or chills. Of  note, pt has Keppra  1500 mg twice daily and lacosamide  150 mg twice daily on his medication list, but per his sister, patient only takes Keppra  normally.   Addendum: after pt arrived to the floor, he started seizing. Pt was given 1500 mg of Keppra , 150 mg of Vimpt and total of 6 mg of Ativan  by IV, still seizing. Suspecting status epilepticus. I ordered another 1500 mg of Keppra  to be given. Consulted ICU NP, [place]. Pt will be transferred to SDU.      Data reviewed independently and ED Course: pt was found to have WBC 6.0, GFR > 60, temperature normal, blood pressure 167/68, heart rate 75, RR 20 oxygen saturation 98% on room air.  CT of head is negative acute intracranial abnormalities, but showed a remote infarction.  Patient is placed in the PCU for observation.    John Spears   ZOX:[id number] DOB: 10/02/1950 DOA: 07/23/2023 PCP: Patient, No Pcp Per   Assessment & Plan:   Principal Problem:   Seizure (HCC) Active Problems:   History of CVA (cerebrovascular accident)   Toxic metabolic encephalopathy   Paroxysmal atrial fibrillation (HCC)   Essential hypertension   HLD (hyperlipidemia)   Chronic diastolic CHF (congestive heart failure) (HCC)   BPH (benign prostatic hyperplasia)   Alcohol abuse   Seizures (HCC)  Assessment and Plan: Bacteremia: blood cxs growing e.coli. Source unclear, but will send urine cx but pt already started on IV rocephin. Have asked lab to work-up urine culture, will check repeat BCs today and also ct of abdomen/pelvis. Does have right hip replacement, denies other hardware. No pain right hip or elsewhere. Bladder scan negative.  Seizure: breakthrough. Secondary to noncompliance w/ meds, infection. Continue on vimpat , keppra . Continue w/ neuro checks. Neuro recs apprec   Fever: secondary to bacteremia. As above  Hypokalemia: mild, will replete   Hx of CVA: continue on statin, aspirin     PAF: continue on coreg . Will re-start xarelto  tomorrow (previously held 2/2 bleeding gums from recent extraction, but that is controlled). Will wait to start until ct results are available   HTN: controlled continue on amlodipine , coreg    HLD: continue on statin     Chronic diastolic CHF: echo on 06/24/2022 showed EF 60 to 65%  with grade 1 diastolic dysfunction.  Patient does not have leg edema or JVD.  Appears compensated. Holding lasix     BPH: continue on home dose of flomax . Will bladder scan    Alcohol abuse: reports last drink 2 weeks ago so withdrawal unlikely. continue on CIWA protocol   Normocytic anemia: H&H are labile. Will transfuse if Hb < 7.0   Debility: family requests snf. Last pt note advised home health. Awaiting re-eval     DVT prophylaxis: lovenox  Code Status: full  Family Communication : daughter at bedside  6/4 Disposition Plan:likely d/c back home w/ HH   Level of care: Progressive  Status is: Inpatient Remains inpatient appropriate because: severity of illness,      Consultants:  Neuro  ICU  Procedures:  Antimicrobials:  Subjective: No complaints  Objective: Vitals:   07/28/23 0334 07/28/23 0500 07/28/23 0748 07/28/23 1141  BP: 119/60  123/61 127/62  Pulse: (!) 53  70 74  Resp: 18  16 14   Temp: 98 F (36.7 C)  98.1 F (36.7 C) 98.1 F (36.7 C)  TempSrc:   Oral   SpO2: 99%  97%   Weight:  74 kg    Height:        Intake/Output Summary (Last 24 hours) at 07/28/2023 1609 Last data filed at 07/28/2023 0354 Gross per 24 hour  Intake 460 ml  Output 200 ml  Net 260 ml   Filed Weights   07/26/23 0559 07/27/23 0500 07/28/23 0500  Weight: 80.1 kg 79.5 kg 74 kg    Examination:  General exam: calm Respiratory system: diminished breath sounds b/l  Cardiovascular system: S1/S2+. No rubs or gallops  Gastrointestinal system: abd is soft, NT, ND & hypoactive bowel sounds Central nervous system:  oriented to self only. Moves all extremities  Psychiatry: Judgement and insight appears poor. Bit confused    Data Reviewed: I have personally reviewed following labs and imaging studies  CBC: Recent Labs  Lab 07/23/23 1836 07/24/23 0350 07/25/23 0434 07/26/23 0354 07/27/23 0415 07/28/23 0458  WBC 6.0 6.1 5.8 10.2 14.7* 15.7*  NEUTROABS 3.4  --   --   --   --   --   HGB 10.0* 9.2* 9.1* 10.0* 8.5* 9.2*  HCT 30.6* 28.7* 27.7* 31.1* 26.3* 28.5*  MCV 89.2 90.3 88.2 89.1 89.2 88.5  PLT 167 151 153 180 147* 154   Basic Metabolic Panel: Recent Labs  Lab 07/24/23 0350 07/25/23 0434 07/26/23 0354 07/27/23 0415 07/28/23 0458  NA 138 142 138 139 139  K 3.1* 3.5 3.7 3.8 3.4*  CL 105 109 105 106 106  CO2 27 25 22 24 25   GLUCOSE 94 89 99 116* 104*  BUN 14 13 20 19 20   CREATININE 0.94 0.81 1.12 1.02 1.02  CALCIUM  9.0 9.0 9.6 8.8* 9.0   GFR: Estimated Creatinine  Clearance: 69.5 mL/min (by C-G formula based on SCr of 1.02 mg/dL). Liver Function Tests: Recent Labs  Lab 07/23/23 1836  AST 27  ALT 14  ALKPHOS 59  BILITOT 0.8  PROT 7.1  ALBUMIN 3.7   No results for input(s): "LIPASE", "AMYLASE" in the last 168 hours. No results for input(s): "AMMONIA" in the last 168 hours. Coagulation Profile: Recent Labs  Lab 07/23/23 1959  INR 1.2   Cardiac Enzymes: No results for input(s): "CKTOTAL", "CKMB", "CKMBINDEX", "TROPONINI" in the last 168 hours. BNP (last 3 results) No results for input(s): "PROBNP" in the last 8760 hours. HbA1C: No results for input(s): "HGBA1C" in the  last 72 hours. CBG: Recent Labs  Lab 07/23/23 2355 07/24/23 0154  GLUCAP 82 104*   Lipid Profile: No results for input(s): "CHOL", "HDL", "LDLCALC", "TRIG", "CHOLHDL", "LDLDIRECT" in the last 72 hours. Thyroid Function Tests: No results for input(s): "TSH", "T4TOTAL", "FREET4", "T3FREE", "THYROIDAB" in the last 72 hours. Anemia Panel: No results for input(s): "VITAMINB12", "FOLATE", "FERRITIN", "TIBC", "IRON ", "RETICCTPCT" in the last 72 hours. Sepsis Labs: Recent Labs  Lab 07/27/23 0415  PROCALCITON 8.84    Recent Results (from the past 240 hours)  MRSA Next Gen by PCR, Nasal     Status: None   Collection Time: 07/24/23  2:51 AM   Specimen: Nasal Mucosa; Nasal Swab  Result Value Ref Range Status   MRSA by PCR Next Gen NOT DETECTED NOT DETECTED Final    Comment: (NOTE) The GeneXpert MRSA Assay (FDA approved for NASAL specimens only), is one component of a comprehensive MRSA colonization surveillance program. It is not intended to diagnose MRSA infection nor to guide or monitor treatment for MRSA infections. Test performance is not FDA approved in patients less than 49 years old. Performed at [facility], [address] [place], [place] [address]   Culture, blood (Routine X 2) w Reflex to ID Panel     Status: Abnormal (Preliminary result)    Collection Time: 07/26/23 12:57 PM   Specimen: BLOOD  Result Value Ref Range Status   Specimen Description   Final    BLOOD BLOOD LEFT ARM Performed at [facility], [address], [place], [place] [address]    Special Requests   Final    BOTTLES DRAWN AEROBIC AND ANAEROBIC Blood Culture adequate volume Performed at [facility], [address], [place], [place] [address]    Culture  Setup Time   Final    GRAM NEGATIVE RODS IN BOTH AEROBIC AND ANAEROBIC BOTTLES CRITICAL RESULT CALLED TO, READ BACK BY AND VERIFIED WITH:  JASON ROBINS AT 0123 07/27/23 JG GRAM STAIN REVIEWED-AGREE WITH RESULT DRT    Culture (A)  Final    ESCHERICHIA COLI SUSCEPTIBILITIES TO FOLLOW Performed at [facility], [address] [place], [place] [address]    Report Status PENDING  Incomplete  Blood Culture ID Panel (Reflexed)     Status: Abnormal   Collection Time: 07/26/23 12:57 PM  Result Value Ref Range Status   Enterococcus faecalis NOT DETECTED NOT DETECTED Final   Enterococcus Faecium NOT DETECTED NOT DETECTED Final   Listeria monocytogenes NOT DETECTED NOT DETECTED Final   Staphylococcus species NOT DETECTED NOT DETECTED Final   Staphylococcus aureus (BCID) NOT DETECTED NOT DETECTED Final   Staphylococcus epidermidis NOT DETECTED NOT DETECTED Final   Staphylococcus lugdunensis NOT DETECTED NOT DETECTED Final   Streptococcus species NOT DETECTED NOT DETECTED Final   Streptococcus agalactiae NOT DETECTED NOT DETECTED Final   Streptococcus pneumoniae NOT DETECTED NOT DETECTED Final   Streptococcus pyogenes NOT DETECTED NOT DETECTED Final   A.calcoaceticus-baumannii NOT DETECTED NOT DETECTED Final   Bacteroides fragilis NOT DETECTED NOT DETECTED Final   Enterobacterales DETECTED (A) NOT DETECTED Final    Comment: Enterobacterales represent a large order of gram negative bacteria, not a single organism. CRITICAL RESULT CALLED TO, READ BACK BY AND VERIFIED WITH:  JASON  ROBINS AT 0123 07/27/23 JG    Enterobacter cloacae complex NOT DETECTED NOT DETECTED Final   Escherichia coli DETECTED (A) NOT DETECTED Final    Comment: CRITICAL RESULT CALLED TO, READ BACK BY AND VERIFIED WITH:  JASON ROBINS AT 0123 07/27/23 JG    Klebsiella aerogenes NOT DETECTED NOT DETECTED Final   Klebsiella oxytoca NOT DETECTED NOT DETECTED Final   Klebsiella pneumoniae NOT DETECTED NOT DETECTED Final   Proteus species NOT DETECTED NOT DETECTED Final   Salmonella species NOT DETECTED NOT DETECTED Final   Serratia marcescens NOT DETECTED NOT DETECTED Final   Haemophilus influenzae NOT DETECTED NOT DETECTED Final   Neisseria meningitidis NOT DETECTED NOT DETECTED Final   Pseudomonas aeruginosa NOT DETECTED NOT DETECTED Final   Stenotrophomonas maltophilia NOT DETECTED NOT DETECTED Final   Candida albicans NOT DETECTED NOT DETECTED Final   Candida auris NOT DETECTED NOT DETECTED Final   Candida glabrata NOT DETECTED NOT DETECTED Final   Candida krusei NOT DETECTED NOT DETECTED Final   Candida parapsilosis NOT DETECTED NOT DETECTED Final   Candida tropicalis NOT DETECTED NOT DETECTED Final   Cryptococcus neoformans/gattii NOT DETECTED NOT DETECTED Final   CTX-M ESBL NOT DETECTED NOT DETECTED Final   Carbapenem resistance IMP NOT DETECTED NOT DETECTED Final   Carbapenem resistance KPC NOT DETECTED NOT DETECTED Final   Carbapenem resistance NDM NOT DETECTED NOT DETECTED Final   Carbapenem resist OXA 48 LIKE NOT DETECTED NOT DETECTED Final   Carbapenem resistance VIM NOT DETECTED NOT DETECTED Final    Comment: Performed at [facility], [address] [place], [place] [address]  Culture, blood (Routine X 2) w Reflex to ID Panel     Status: Abnormal (Preliminary result)   Collection Time: 07/26/23  1:03 PM   Specimen: BLOOD  Result Value Ref Range Status   Specimen Description   Final    BLOOD BLOOD RIGHT ARM Performed at [facility], [address]  [place], [place] [address]    Special Requests   Final    BOTTLES DRAWN AEROBIC AND ANAEROBIC Blood Culture adequate volume Performed at [facility], [address] [place], [place] [address]    Culture  Setup Time   Final    GRAM NEGATIVE RODS AEROBIC BOTTLE ONLY CRITICAL VALUE NOTED.  VALUE IS CONSISTENT WITH PREVIOUSLY REPORTED AND CALLED VALUE. GRAM STAIN REVIEWED-AGREE WITH RESULT DRT Performed at [facility], [address], [place], [place] [address]    Culture ESCHERICHIA COLI (A)  Final   Report Status PENDING  Incomplete  Urine Culture (for pregnant, neutropenic or urologic patients or patients with an indwelling urinary catheter)     Status: Abnormal   Collection Time: 07/27/23  8:49 AM   Specimen: Urine, Clean Catch  Result Value Ref Range Status   Specimen Description   Final    URINE, CLEAN CATCH Performed at [facility], [address] [place], [place] [address]    Special Requests   Final    NONE Performed at [facility], [address], [place], [place] [address]    Culture (A)  Final    <10,000 COLONIES/mL INSIGNIFICANT GROWTH Performed at [facility], [address], [place], [place] [address]    Report Status 07/28/2023 FINAL  Final         Radiology Studies: No results found.       Scheduled Meds:  amLODipine   10 mg Oral QHS   aspirin  EC  81 mg Oral Daily   atorvastatin   40 mg Oral QHS   carvedilol   12.5 mg Oral BID WC   cyanocobalamin   1,000 mcg Oral Daily   folic acid   1 mg Oral Daily  gabapentin  100 mg Oral BID   lacosamide   150 mg Oral BID   levETIRAcetam   1,500 mg Oral BID   multivitamin with minerals  1 tablet Oral Daily   tamsulosin   0.4 mg Oral QHS   thiamine   100 mg Oral Daily   Or   thiamine   100 mg Intravenous Daily   Continuous Infusions:  cefTRIAXone (ROCEPHIN)  IV 2 g (07/28/23 0200)      LOS: 4 days     Raymonde Calico, MD [facility]  If 7PM-7AM, please  contact night-coverage [email or web address] 07/28/2023, 4:09 PM

## 2023-07-28 NOTE — Progress Notes (Signed)
 Occupational Therapy Treatment Patient Details Name: John Spears MRN: [id number] DOB: [birth year] Today's Date: 07/28/2023   History of present illness John Spears is a 73 y.o. male with medical history significant of seizure, HTN, H LD, dCHF, stroke, A fib on Xarelto , BPH, who presents with tremor, Pt was seen in ED this AM, and had negative work up. Pt was discharged home and instructed to return to his normal Keppra  dosing. After went home, pt developed right hand shaking, right facial droop and became unresponsive per her sister. Pt was brought back to ED again. 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  2. Stable remote left parietal cortical infarct.  3. Stable mild periventricular white matter changes likely  reflecting the sequela of small vessel ischemia.   OT comments  Pt is supine in bed on arrival. Pleasant and agreeable to OT session. He denies pain. Found in mittens, removed for session and replaced at end. Pt performed bed mobility with supervision, increased time from semi-supine. Min A x2 for initial STS, then progressed to CGA from recliner x2 trials and again from EOB with Min A x1 to RW. Pt ambulated 2 bouts of ~50 feet using RW with Min/CGA d/t slow shuffling gait with narrow BOS and notable shakiness. Pt reports mild dizziness at start of session that subsided. Pt stood at sink to perform standing grooming tasks with support on RW and CGA from therapist and mobility tech with noted forward flexed posture. Pt does not frequent cueing for safety d/t impulsivity and safety with RW during turns.  Pt returned to bed with all needs in place and will cont to require skilled acute OT services to maximize his safety and IND to return to PLOF.       If plan is discharge home, recommend the  following:  A little help with walking and/or transfers;A little help with bathing/dressing/bathroom;Assistance with cooking/housework;Assist for transportation;Help with stairs or ramp for entrance;Direct supervision/assist for medications management;Direct supervision/assist for financial management;Supervision due to cognitive status   Equipment Recommendations  BSC/3in1    Recommendations for Other Services      Precautions / Restrictions Precautions Precautions: Fall Recall of Precautions/Restrictions: Impaired Restrictions Weight Bearing Restrictions Per Provider Order: No       Mobility Bed Mobility Overal bed mobility: Needs Assistance Bed Mobility: Supine to Sit, Sit to Supine     Supine to sit: Supervision, HOB elevated Sit to supine: Supervision   General bed mobility comments: no assist to get OOB from semi-supine position, increased time and effort, difficulty forward scooting    Transfers Overall transfer level: Needs assistance Equipment used: Rolling walker (2 wheels) Transfers: Sit to/from Stand Sit to Stand: Min assist, Contact guard assist           General transfer comment: Min A to stand from EOB, CGA from recliner to RW; ambulated with Min/CGA ~100 feet total for bouts of 50 ft x2 with close chair follow     Balance Overall balance assessment: Needs assistance Sitting-balance support: Feet supported Sitting balance-Leahy Scale: Fair Sitting balance - Comments: no LOB while seated at EOB without support-SBA   Standing balance support: During functional activity, Reliant on assistive device for balance, Single extremity supported Standing balance-Leahy Scale: Poor Standing balance comment: CGA for safety in standing with anterior lean/forward flexed posture for standing grooming tasks  ADL either performed or assessed with clinical judgement   ADL Overall ADL's : Needs assistance/impaired     Grooming:  Oral care;Wash/dry face;Standing;Cueing for safety;Cueing for sequencing;Contact guard assist;Minimal assistance Grooming Details (indicate cue type and reason): min/CGA for safety in standing at sink with unilateral support, forward flexed posture, cueing and one step directions for task completion                             Functional mobility during ADLs: Minimal assistance;Rolling walker (2 wheels)      Extremity/Trunk Assessment              Vision       Perception     Praxis     Communication Communication Communication: Impaired Factors Affecting Communication: Difficulty expressing self;Reduced clarity of speech   Cognition Arousal: Alert Behavior During Therapy: Flat affect, Impulsive               OT - Cognition Comments: cueing for safety, impulsive with standing                 Following commands: Impaired Following commands impaired: Only follows one step commands consistently      Cueing   Cueing Techniques: Verbal cues, Tactile cues  Exercises      Shoulder Instructions       General Comments cueing for safety d/t impulsivity this date    Pertinent Vitals/ Pain       Pain Assessment Pain Assessment: No/denies pain  Home Living                                          Prior Functioning/Environment              Frequency  Min 2X/week        Progress Toward Goals  OT Goals(current goals can now be found in the care plan section)  Progress towards OT goals: Progressing toward goals  Acute Rehab OT Goals Patient Stated Goal: get better OT Goal Formulation: With patient Time For Goal Achievement: 08/09/23 Potential to Achieve Goals: Good  Plan      Co-evaluation                 AM-PAC OT "6 Clicks" Daily Activity     Outcome Measure   Help from another person eating meals?: None Help from another person taking care of personal grooming?: A Little Help from another person  toileting, which includes using toliet, bedpan, or urinal?: A Little Help from another person bathing (including washing, rinsing, drying)?: A Lot Help from another person to put on and taking off regular upper body clothing?: A Little Help from another person to put on and taking off regular lower body clothing?: A Lot 6 Click Score: 17    End of Session Equipment Utilized During Treatment: Rolling walker (2 wheels);Gait belt  OT Visit Diagnosis: Other abnormalities of gait and mobility (R26.89);Muscle weakness (generalized) (M62.81)   Activity Tolerance Patient tolerated treatment well   Patient Left in bed;with call bell/phone within reach;with bed alarm set;with family/visitor present   Nurse Communication Mobility status        Time: 1449-1520 OT Time Calculation (min): 31 min  Charges: OT General Charges $OT Visit: 1 Visit OT Treatments $Self Care/Home Management : 8-22 mins $Therapeutic Activity: 8-22 mins  Ivylynn Hoppes,  OTR/L  07/28/23, 4:04 PM   Demoni Gergen E Amritha Yorke 07/28/2023, 4:01 PM

## 2023-07-28 NOTE — Plan of Care (Signed)
  Problem: Education: Goal: Knowledge of General Education information will improve Description: Including pain rating scale, medication(s)/side effects and non-pharmacologic comfort measures Outcome: Progressing   Problem: Health Behavior/Discharge Planning: Goal: Ability to manage health-related needs will improve Outcome: Progressing   Problem: Clinical Measurements: Goal: Ability to maintain clinical measurements within normal limits will improve Outcome: Progressing Goal: Will remain free from infection Outcome: Progressing   Problem: Activity: Goal: Risk for activity intolerance will decrease Outcome: Progressing   Problem: Nutrition: Goal: Adequate nutrition will be maintained Outcome: Progressing   Problem: Coping: Goal: Level of anxiety will decrease Outcome: Progressing   Problem: Safety: Goal: Ability to remain free from injury will improve Outcome: Progressing

## 2023-07-28 NOTE — Progress Notes (Signed)
 Spoke with patient's sister Ninette Basque who stated that pt would have to go to a SNF if unable to care for self at home as she is in poor health and is 28. Notified SW and MD via secure chat.

## 2023-07-29 DIAGNOSIS — R569 Unspecified convulsions: Secondary | ICD-10-CM | POA: Diagnosis not present

## 2023-07-29 LAB — CBC
HCT: 26.4 % — ABNORMAL LOW (ref 39.0–52.0)
Hemoglobin: 8.3 g/dL — ABNORMAL LOW (ref 13.0–17.0)
MCH: 28 pg (ref 26.0–34.0)
MCHC: 31.4 g/dL (ref 30.0–36.0)
MCV: 89.2 fL (ref 80.0–100.0)
Platelets: 162 10*3/uL (ref 150–400)
RBC: 2.96 MIL/uL — ABNORMAL LOW (ref 4.22–5.81)
RDW: 14.9 % (ref 11.5–15.5)
WBC: 11.6 10*3/uL — ABNORMAL HIGH (ref 4.0–10.5)
nRBC: 0 % (ref 0.0–0.2)

## 2023-07-29 LAB — CULTURE, BLOOD (ROUTINE X 2)
Special Requests: ADEQUATE
Special Requests: ADEQUATE

## 2023-07-29 LAB — BASIC METABOLIC PANEL WITH GFR
Anion gap: 8 (ref 5–15)
BUN: 17 mg/dL (ref 8–23)
CO2: 23 mmol/L (ref 22–32)
Calcium: 9 mg/dL (ref 8.9–10.3)
Chloride: 109 mmol/L (ref 98–111)
Creatinine, Ser: 1.01 mg/dL (ref 0.61–1.24)
GFR, Estimated: >60 mL/min (ref >60)
Glucose, Bld: 102 mg/dL — ABNORMAL HIGH (ref 70–99)
Potassium: 4 mmol/L (ref 3.5–5.1)
Sodium: 140 mmol/L (ref 135–145)

## 2023-07-29 MED ORDER — POLYETHYLENE GLYCOL 3350 17 G PO PACK
17.0000 g | PACK | Freq: Every day | ORAL | Status: DC
  Administered 2023-08-03 – 2023-08-04 (×2): 17 g via ORAL
  Filled 2023-07-29 (×4): qty 1

## 2023-07-29 MED ORDER — HYDROXYZINE HCL 50 MG PO TABS
25.0000 mg | ORAL_TABLET | Freq: Three times a day (TID) | ORAL | Status: DC | PRN
  Administered 2023-07-29 – 2023-08-02 (×5): 25 mg via ORAL
  Filled 2023-07-29 (×5): qty 1

## 2023-07-29 MED ORDER — LORAZEPAM 2 MG/ML IJ SOLN: 4.0000 mg | INTRAMUSCULAR | Status: DC | PRN

## 2023-07-29 MED ORDER — LORAZEPAM 2 MG/ML IJ SOLN
INTRAMUSCULAR | Status: AC
  Filled 2023-07-29: qty 1

## 2023-07-29 MED ORDER — RIVAROXABAN 20 MG PO TABS
20.0000 mg | ORAL_TABLET | Freq: Every day | ORAL | Status: DC
  Administered 2023-07-29 – 2023-08-03 (×6): 20 mg via ORAL
  Filled 2023-07-29 (×7): qty 1

## 2023-07-29 NOTE — Progress Notes (Signed)
 Physical Therapy Treatment Patient Details Name: John Spears MRN: [id number] DOB: 1950-12-07 Today's Date: 07/29/2023   History of Present Illness John Spears is a 73 y.o. male with medical history significant of seizure, HTN, H LD, dCHF, stroke, A fib on Xarelto , BPH, who presents with tremor, Pt was seen in ED this AM, and had negative work up. Pt was discharged home and instructed to return to his normal Keppra  dosing. After went home, pt developed right hand shaking, right facial droop and became unresponsive per her sister. Pt was brought back to ED again. 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  2. Stable remote left parietal cortical infarct.  3. Stable mild periventricular white matter changes likely  reflecting the sequela of small vessel ischemia.    PT Comments  Pt received impulsively trying to exit bed reporting need for urgent BM. Throughout session pt continues to rely heavily on max multi modal cuing for safe mobility and reduced falls risk due to poor safety awareness and impulsivity. Supervision for bed mobility and min VC's for hand placement on RW and MinA+1 for STS and SPT to [facility] needing heavy multi modal cuing for body positioning, RW positioning and minA for RW mgmt. Pt passes BM and is able to attempt pericare in sitting but is reliant on minA for STS and BUE on RW and dependent for thorough cleaning with pericare by PT. Continued need for redirection to task and SUE and BUE support for hand hygiene at sink following one step commands inconsistently. Ambulation performed around foot of bed to recliner needing continued max multi modal cuing for RW and body positioning navigating obstacles in room and max assist on pelvis to ensure sitting in recliner as pt attempts to sit over bed rail  into bed not understanding bed rail is up and unable to be redirected to recliner. Upon sitting OT entering room with pt reporting wanting to return to bed impulsively trying to return to standing. Able to be redirected to wait for OT. Pt left in care of OT. D/c recs updated as pt has declined in safe mobility and tolerance with very poor safety awareness and understanding of deficits needing skilled personnel to safely reduce falls risk.    If plan is discharge home, recommend the following: Assistance with cooking/housework;Assist for transportation;Help with stairs or ramp for entrance;A lot of help with walking and/or transfers;A little help with bathing/dressing/bathroom;Direct supervision/assist for medications management   Can travel by private vehicle     Yes  Equipment Recommendations  Other (comment) (TBD by next venue of care)    Recommendations for Other Services       Precautions / Restrictions Precautions Precautions: Fall Recall of Precautions/Restrictions: Impaired Restrictions Weight Bearing Restrictions Per Provider Order: No     Mobility  Bed Mobility Overal bed mobility: Needs Assistance Bed Mobility: Supine to Sit     Supine to sit: Supervision, HOB elevated       Patient Response: Cooperative  Transfers Overall transfer level: Needs assistance Equipment used: Rolling walker (2 wheels) Transfers: Sit to/from Stand, Bed to chair/wheelchair/BSC Sit to Stand: Min assist   Step pivot transfers: Min assist       General transfer comment: VC's for hand placement. Pt impulsive trying to stand due to need for urgent BM. MinA on RW and max multi modal  cuing for transferring to [facility].    Ambulation/Gait Ambulation/Gait assistance: Mod assist Gait Distance (Feet): 14 Feet Assistive device: Rolling walker (2 wheels) Gait Pattern/deviations: Step-to pattern, Narrow base of support, Trunk flexed       General Gait Details: Pt ambulating around bed to  recliner. Needs modA on RW and body positioning for turns due to limited space. Pt very impulsive with poor safety awareness with RW, body positioning within  AD. Impulsively tries to sit over bed railing into bed needing max multimodal cuing and miNA to sit in recliner.   Stairs             Wheelchair Mobility     Tilt Bed Tilt Bed Patient Response: Cooperative  Modified Rankin (Stroke Patients Only)       Balance Overall balance assessment: Needs assistance Sitting-balance support: Feet supported Sitting balance-Leahy Scale: Fair     Standing balance support: During functional activity, Reliant on assistive device for balance, Single extremity supported Standing balance-Leahy Scale: Poor Standing balance comment: heavy BUE support on RW. Very tremulous in UE's and LE's. Reliant on at least SUE support on surfaces for hand hygiene.                            Communication Communication Communication: Impaired Factors Affecting Communication: Difficulty expressing self;Reduced clarity of speech  Cognition Arousal: Alert Behavior During Therapy: Flat affect, Impulsive   PT - Cognitive impairments: No family/caregiver present to determine baseline                         Following commands: Impaired Following commands impaired: Follows one step commands inconsistently    Cueing Cueing Techniques: Tactile cues, Gestural cues, Verbal cues  Exercises      General Comments        Pertinent Vitals/Pain Pain Assessment Pain Assessment: No/denies pain    Home Living                          Prior Function            PT Goals (current goals can now be found in the care plan section) Acute Rehab PT Goals Patient Stated Goal: to go home PT Goal Formulation: With patient Time For Goal Achievement: 08/08/23 Potential to Achieve Goals: Poor Progress towards PT goals: Progressing toward goals    Frequency    Min  2X/week      PT Plan      Co-evaluation              AM-PAC PT "6 Clicks" Mobility   Outcome Measure  Help needed turning from your back to your side while in a flat bed without using bedrails?: A Little Help needed moving from lying on your back to sitting on the side of a flat bed without using bedrails?: A Little Help needed moving to and from a bed to a chair (including a wheelchair)?: A Lot Help needed standing up from a chair using your arms (e.g., wheelchair or bedside chair)?: A Lot Help needed to walk in hospital room?: A Lot Help needed climbing 3-5 steps with a railing? : A Lot 6 Click Score: 14    End of Session Equipment Utilized During Treatment: Gait belt Activity Tolerance: Patient tolerated treatment well Patient left: in chair (hand off to OT.) Nurse Communication: Mobility status PT Visit Diagnosis: Other abnormalities of gait  and mobility (R26.89);Difficulty in walking, not elsewhere classified (R26.2);Muscle weakness (generalized) (M62.81)     Time: 1610-9604 PT Time Calculation (min) (ACUTE ONLY): 16 min  Charges:    $Gait Training: 8-22 mins PT General Charges $$ ACUTE PT VISIT: 1 Visit                     Marc Senior. Fairly IV, PT, DPT Physical Therapist- [place]  [facility] 07/29/2023, 11:28 AM

## 2023-07-29 NOTE — NC FL2 (Signed)
 [place]  MEDICAID FL2 LEVEL OF CARE FORM     IDENTIFICATION  Patient Name: John Spears Birthdate: 1950-08-18 Sex: male Admission Date (Current Location): 07/23/2023  [place] and [place] Number:  Chiropodist and Address:  [facility], [address] [place], [place] [address]      Provider Number: [id number]  Attending Physician Name and Address:  Janeane Mealy, MD  Relative Name and Phone Number:       Current Level of Care: Hospital Recommended Level of Care: Skilled Nursing Facility Prior Approval Number:    Date Approved/Denied:   PASRR Number: [id number]  Discharge Plan: SNF    Current Diagnoses: Patient Active Problem List   Diagnosis Date Noted   HLD (hyperlipidemia) 07/23/2023   BPH (benign prostatic hyperplasia) 07/23/2023   Chronic diastolic CHF (congestive heart failure) (HCC) 03/09/2023   Paroxysmal atrial fibrillation (HCC) 03/09/2023   Seizure (HCC) 03/09/2023   Toxic metabolic encephalopathy 03/09/2023   Acute metabolic encephalopathy 01/26/2023   Malnutrition of moderate degree 12/24/2022   ETOH abuse 12/22/2022   Thrombocytopenia (HCC) 12/22/2022   Breakthrough seizure (HCC) 12/22/2022   CKD (chronic kidney disease), stage III (HCC) 09/25/2022   Chest congestion 04/04/2022   Chronic cough 04/04/2022   Dilated cardiomyopathy (HCC) 02/26/2022   Alcohol abuse 02/26/2022   Seizures (HCC) 02/20/2022   Transaminitis    Traumatic rhabdomyolysis (HCC)    Thrombocytopenia (HCC)    History of CVA (cerebrovascular accident) 10/22/2016   Essential hypertension 10/22/2016   Alcoholism (HCC) 10/22/2016   Cerebrovascular accident (CVA) (HCC) 04/05/2016    Orientation RESPIRATION BLADDER Height & Weight     Self, Place  Normal Incontinent Weight: 168 lb 3.4 oz (76.3 kg) Height:  5\' 11"  (180.3 cm)  BEHAVIORAL SYMPTOMS/MOOD NEUROLOGICAL BOWEL NUTRITION STATUS   (None)  (History of seizures) Incontinent  Diet (DYS 2. Extra sauces and gravies.)  AMBULATORY STATUS COMMUNICATION OF NEEDS Skin   Limited Assist Verbally Normal                       Personal Care Assistance Level of Assistance  Bathing, Dressing, Feeding Bathing Assistance: Limited assistance Feeding assistance: Limited assistance Dressing Assistance: Limited assistance     Functional Limitations Info  Sight, Hearing, Speech Sight Info: Adequate Hearing Info: Adequate Speech Info: Adequate    SPECIAL CARE FACTORS FREQUENCY  PT (By licensed PT), OT (By licensed OT)     PT Frequency: 5 x week OT Frequency: 5 x week            Contractures Contractures Info: Not present    Additional Factors Info  Code Status, Allergies Code Status Info: Full code Allergies Info: NKDA           Current Medications (07/29/2023):  This is the current hospital active medication list Current Facility-Administered Medications  Medication Dose Route Frequency Provider Last Rate Last Admin   acetaminophen  (TYLENOL ) tablet 650 mg  650 mg Oral Q6H PRN Williams, Jamiese M, MD   650 mg at 07/27/23 1724   amLODipine  (NORVASC ) tablet 10 mg  10 mg Oral QHS Niu, Xilin, MD   10 mg at 07/28/23 2047   aspirin  EC tablet 81 mg  81 mg Oral Daily Niu, Xilin, MD   81 mg at 07/29/23 0846   atorvastatin  (LIPITOR) tablet 40 mg  40 mg Oral QHS Niu, Xilin, MD   40 mg at 07/28/23 2045   carvedilol  (COREG ) tablet 12.5 mg  12.5  mg Oral BID WC Niu, Xilin, MD   12.5 mg at 07/29/23 0846   cefTRIAXone (ROCEPHIN) 2 g in sodium chloride  0.9 % 100 mL IVPB  2 g Intravenous Q24H Mansy, Jan A, MD 200 mL/hr at 07/29/23 0159 2 g at 07/29/23 0159   cyanocobalamin  (VITAMIN B12) tablet 1,000 mcg  1,000 mcg Oral Daily Niu, Xilin, MD   1,000 mcg at 07/29/23 0846   enoxaparin  (LOVENOX ) injection 40 mg  40 mg Subcutaneous Q24H Janeane Mealy, MD   40 mg at 07/28/23 2046   folic acid  (FOLVITE ) tablet 1 mg  1 mg Oral Daily Niu, Xilin, MD   1 mg at 07/29/23 0846    gabapentin (NEURONTIN) capsule 100 mg  100 mg Oral BID Niu, Xilin, MD   100 mg at 07/29/23 1610   lacosamide  (VIMPAT ) tablet 150 mg  150 mg Oral BID Arora, Ashish, MD   150 mg at 07/29/23 9604   levETIRAcetam  (KEPPRA ) tablet 1,500 mg  1,500 mg Oral BID Arora, Ashish, MD   1,500 mg at 07/29/23 5409   multivitamin with minerals tablet 1 tablet  1 tablet Oral Daily Niu, Xilin, MD   1 tablet at 07/29/23 8119   Oral care mouth rinse  15 mL Mouth Rinse PRN Mansy, Jan A, MD       oxyCODONE -acetaminophen  (PERCOCET/ROXICET) 5-325 MG per tablet 1 tablet  1 tablet Oral Q4H PRN Niu, Xilin, MD   1 tablet at 07/28/23 2045   tamsulosin  (FLOMAX ) capsule 0.4 mg  0.4 mg Oral QHS Niu, Xilin, MD   0.4 mg at 07/28/23 2045   thiamine  (VITAMIN B1) tablet 100 mg  100 mg Oral Daily Niu, Xilin, MD   100 mg at 07/29/23 1478   Or   thiamine  (VITAMIN B1) injection 100 mg  100 mg Intravenous Daily Niu, Xilin, MD         Discharge Medications: Please see discharge summary for a list of discharge medications.  Relevant Imaging Results:  Relevant Lab Results:   Additional Information SS#: [id number]  Odilia Bennett, LCSW

## 2023-07-29 NOTE — Plan of Care (Signed)
  Problem: Clinical Measurements: Goal: Diagnostic test results will improve Outcome: Progressing   Problem: Clinical Measurements: Goal: Respiratory complications will improve Outcome: Progressing   Problem: Clinical Measurements: Goal: Cardiovascular complication will be avoided Outcome: Progressing   Problem: Nutrition: Goal: Adequate nutrition will be maintained Outcome: Progressing   

## 2023-07-29 NOTE — Progress Notes (Signed)
 Occupational Therapy Treatment Patient Details Name: John Spears MRN: [id number] DOB: 02/15/1951 Today's Date: 07/29/2023   History of present illness John Spears is a 73 y.o. male with medical history significant of seizure, HTN, H LD, dCHF, stroke, A fib on Xarelto , BPH, who presents with tremor, Pt was seen in ED this AM, and had negative work up. Pt was discharged home and instructed to return to his normal Keppra  dosing. After went home, pt developed right hand shaking, right facial droop and became unresponsive per her sister. Pt was brought back to ED again. 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  2. Stable remote left parietal cortical infarct.  3. Stable mild periventricular white matter changes likely  reflecting the sequela of small vessel ischemia.   OT comments  Pt is standing from recliner with PT present on arrival. Pt did not want to sit in recliner, wished to return to bed. OT took over, pt needs Min A using RW to ambulate around the bed to return to supine. Constant multi-modal cueing in small, narrow environment and assist for RW management for all steering. He was able to return to supine with supervision. Performed face washing at bed level and oral care with supervision and cueing for initiation. Pt was educated on BUE AROM exercises that he performed with visual demo of all tasks for carryover to prevent further recliner. Pt is very tremulous throughout session, remains a high fall risk with notable confusion and need for constant multi-modal cueing to perform most tasks. Pt returned to bed with all needs in place and will cont to require skilled acute OT services to maximize his safety and IND to return to PLOF.       If plan is discharge home, recommend the following:  A little  help with walking and/or transfers;A little help with bathing/dressing/bathroom;Assistance with cooking/housework;Assist for transportation;Help with stairs or ramp for entrance;Direct supervision/assist for medications management;Direct supervision/assist for financial management;Supervision due to cognitive status   Equipment Recommendations  BSC/3in1    Recommendations for Other Services      Precautions / Restrictions Precautions Precautions: Fall Recall of Precautions/Restrictions: Impaired Restrictions Weight Bearing Restrictions Per Provider Order: No       Mobility Bed Mobility Overal bed mobility: Needs Assistance Bed Mobility: Sit to Supine       Sit to supine: Supervision, Used rails   General bed mobility comments: handoff from PT, seated in recliner on entry; needed supervision to return to bed    Transfers Overall transfer level: Needs assistance Equipment used: Rolling walker (2 wheels) Transfers: Sit to/from Stand Sit to Stand: Min assist           General transfer comment: RW management and constant cueing for use of RW to ambulate in small, narrow space around bed from recliner back to bed; very tremulous     Balance Overall balance assessment: Needs assistance Sitting-balance support: Feet supported Sitting balance-Leahy Scale: Fair     Standing balance support: Reliant on assistive device for balance, Bilateral upper extremity supported Standing balance-Leahy Scale: Poor Standing balance comment: tremulous throughout, heavy UE support on RW, constant multimodal cueing for safety and use of RW                           ADL either  performed or assessed with clinical judgement   ADL Overall ADL's : Needs assistance/impaired     Grooming: Wash/dry face;Oral care;Bed level;Supervision/safety;Cueing for sequencing Grooming Details (indicate cue type and reason): cueing to follow directions and initiate task                              Functional mobility during ADLs: Minimal assistance;Rolling walker (2 wheels)      Extremity/Trunk Assessment              Vision       Perception     Praxis     Communication Communication Communication: Impaired Factors Affecting Communication: Difficulty expressing self;Reduced clarity of speech   Cognition Arousal: Alert Behavior During Therapy: Flat affect, Impulsive                                 Following commands: Impaired Following commands impaired: Follows one step commands inconsistently      Cueing   Cueing Techniques: Tactile cues, Gestural cues, Verbal cues  Exercises Other Exercises Other Exercises: BUE AROM exercises performed at bed level for 10 reps each with visual demo for carryover to prevent further weakness/decline.    Shoulder Instructions       General Comments      Pertinent Vitals/ Pain       Pain Assessment Pain Assessment: No/denies pain  Home Living                                          Prior Functioning/Environment              Frequency  Min 2X/week        Progress Toward Goals  OT Goals(current goals can now be found in the care plan section)  Progress towards OT goals: Progressing toward goals  Acute Rehab OT Goals Patient Stated Goal: get better OT Goal Formulation: With patient Time For Goal Achievement: 08/09/23 Potential to Achieve Goals: Good  Plan      Co-evaluation                 AM-PAC OT "6 Clicks" Daily Activity     Outcome Measure   Help from another person eating meals?: None Help from another person taking care of personal grooming?: A Little Help from another person toileting, which includes using toliet, bedpan, or urinal?: A Little Help from another person bathing (including washing, rinsing, drying)?: A Lot Help from another person to put on and taking off regular upper body clothing?: A Little Help from another person to  put on and taking off regular lower body clothing?: A Lot 6 Click Score: 17    End of Session Equipment Utilized During Treatment: Rolling walker (2 wheels);Gait belt  OT Visit Diagnosis: Other abnormalities of gait and mobility (R26.89);Muscle weakness (generalized) (M62.81)   Activity Tolerance Patient tolerated treatment well   Patient Left in bed;with call bell/phone within reach;with bed alarm set   Nurse Communication Mobility status        Time: 0981-1914 OT Time Calculation (min): 18 min  Charges: OT General Charges $OT Visit: 1 Visit OT Treatments $Therapeutic Exercise: 8-22 mins  [place] Scheiderer, OTR/L  07/29/23, 1:18 PM   Camani Sesay E Ward Boissonneault 07/29/2023, 1:15 PM

## 2023-07-29 NOTE — Progress Notes (Signed)
 PROGRESS NOTE   HPI was taken from Dr. Rosalea Collin: John Spears is a 73 y.o. male with medical history significant of seizure, HTN, H LD, dCHF, stroke, A fib on Xarelto , BPH, who presents with tremor.   Per her sister (I called her sister by phone), pt has not been taking his Keppra  in the past two days due to dental procedure. Pt had 4 teeth pullout. His bottom gums are oozing blood. He still has oozing blood from dental surgery. Pt developed tremors this AM which often precede his seizure activity per her sister. Pt was seen in ED this AM, and had negative work up. Pt was discharged home and instructed to return to his normal Keppra  dosing. After went home, pt developed right hand shaking, right facial droop and became unresponsive per her sister. Pt was brought back to ED again.    When I saw patient in ED, patient is mildly confused.  He knows his own name, is orientated to place, but confused about year.  He moves all extremities, no facial droop or slurred speech on my examination.  Denies chest pain, cough, SOB.  No nausea, vomiting, diarrhea or abdominal pain.  No symptoms of UTI.  No fever or chills. Of  note, pt has Keppra  1500 mg twice daily and lacosamide  150 mg twice daily on his medication list, but per his sister, patient only takes Keppra  normally.   Addendum: after pt arrived to the floor, he started seizing. Pt was given 1500 mg of Keppra , 150 mg of Vimpt and total of 6 mg of Ativan  by IV, still seizing. Suspecting status epilepticus. I ordered another 1500 mg of Keppra  to be given. Consulted ICU NP, Ouma. Pt will be transferred to SDU.      Data reviewed independently and ED Course: pt was found to have WBC 6.0, GFR > 60, temperature normal, blood pressure 167/68, heart rate 75, RR 20 oxygen saturation 98% on room air.  CT of head is negative acute intracranial abnormalities, but showed a remote infarction.  Patient is placed in the PCU for observation.    Darroll Bredeson   AVW:[id number] DOB: Jan 17, 1951 DOA: 07/23/2023 PCP: Patient, No Pcp Per   Assessment & Plan:   Principal Problem:   Seizure (HCC) Active Problems:   History of CVA (cerebrovascular accident)   Toxic metabolic encephalopathy   Paroxysmal atrial fibrillation (HCC)   Essential hypertension   HLD (hyperlipidemia)   Chronic diastolic CHF (congestive heart failure) (HCC)   BPH (benign prostatic hyperplasia)   Alcohol abuse   Seizures (HCC)  Assessment and Plan: Bacteremia: blood cxs growing e.coli. Source unclear, possibly urine, culture pending. Ct abdomen/pelvis nothing acute no signs infection. Repeat BCs no growth thus far. Only hardware is right hip replacement.  Bladder scan negative  Seizure: breakthrough. Secondary to noncompliance w/ meds, infection. Continue on vimpat , keppra . Continue w/ neuro checks. Neuro recs appreciated  Fever: secondary to bacteremia. As above  Hypokalemia: resolved   Hx of CVA: continue on statin, aspirin     PAF: continue on coreg . Restarting xarelto  today, monitor for bleeding (recent dental procedure had bleeding from that)   HTN: controlled continue on amlodipine , coreg    HLD: continue on statin     Chronic diastolic CHF: echo on 06/24/2022 showed EF 60 to 65% with grade 1 diastolic dysfunction.  Patient does not have leg edema or JVD.  Appears compensated. Holding lasix     BPH: continue on home dose of flomax . Bladder scan negative  Alcohol abuse: reports last drink 2 weeks ago so withdrawal unlikely  Normocytic anemia: H&H are labile but stable.    Debility: PT advising snf and family desires that, TOC aware and working on it     DVT prophylaxis: xarelto  Code Status: full  Family Communication : daughter at bedside 6/4. No answer when sister betty telephoned today Disposition Plan: snf  Level of care: Progressive  Status is: Inpatient Remains inpatient appropriate because: severity of illness,      Consultants:  Neuro   ICU  Procedures:  Antimicrobials:  Subjective: No complaints, eating lunch  Objective: Vitals:   07/29/23 0443 07/29/23 0500 07/29/23 0841 07/29/23 1232  BP: (!) 142/70  134/71 138/68  Pulse: 63  78 73  Resp: 16     Temp: 98.4 F (36.9 C)  97.8 F (36.6 C) 98.5 F (36.9 C)  TempSrc:      SpO2: 99%  100% 99%  Weight:  76.3 kg    Height:        Intake/Output Summary (Last 24 hours) at 07/29/2023 1319 Last data filed at 07/28/2023 2035 Gross per 24 hour  Intake 0 ml  Output 400 ml  Net -400 ml   Filed Weights   07/27/23 0500 07/28/23 0500 07/29/23 0500  Weight: 79.5 kg 74 kg 76.3 kg    Examination:  General exam: calm Respiratory system: diminished breath sounds b/l  Cardiovascular system: S1/S2+. No rubs or gallops  Gastrointestinal system: abd is soft, NT, ND & hypoactive bowel sounds Central nervous system:  oriented to self only. Moves all extremities  Psychiatry: Judgement and insight appears poor. Bit confused    Data Reviewed: I have personally reviewed following labs and imaging studies  CBC: Recent Labs  Lab 07/23/23 1836 07/24/23 0350 07/25/23 0434 07/26/23 0354 07/27/23 0415 07/28/23 0458 07/29/23 0251  WBC 6.0   < > 5.8 10.2 14.7* 15.7* 11.6*  NEUTROABS 3.4  --   --   --   --   --   --   HGB 10.0*   < > 9.1* 10.0* 8.5* 9.2* 8.3*  HCT 30.6*   < > 27.7* 31.1* 26.3* 28.5* 26.4*  MCV 89.2   < > 88.2 89.1 89.2 88.5 89.2  PLT 167   < > 153 180 147* 154 162   < > = values in this interval not displayed.   Basic Metabolic Panel: Recent Labs  Lab 07/25/23 0434 07/26/23 0354 07/27/23 0415 07/28/23 0458 07/29/23 0251  NA 142 138 139 139 140  K 3.5 3.7 3.8 3.4* 4.0  CL 109 105 106 106 109  CO2 25 22 24 25 23   GLUCOSE 89 99 116* 104* 102*  BUN 13 20 19 20 17   CREATININE 0.81 1.12 1.02 1.02 1.01  CALCIUM  9.0 9.6 8.8* 9.0 9.0   GFR: Estimated Creatinine Clearance: 71.4 mL/min (by C-G formula based on SCr of 1.01 mg/dL). Liver Function  Tests: Recent Labs  Lab 07/23/23 1836  AST 27  ALT 14  ALKPHOS 59  BILITOT 0.8  PROT 7.1  ALBUMIN 3.7   No results for input(s): "LIPASE", "AMYLASE" in the last 168 hours. No results for input(s): "AMMONIA" in the last 168 hours. Coagulation Profile: Recent Labs  Lab 07/23/23 1959  INR 1.2   Cardiac Enzymes: No results for input(s): "CKTOTAL", "CKMB", "CKMBINDEX", "TROPONINI" in the last 168 hours. BNP (last 3 results) No results for input(s): "PROBNP" in the last 8760 hours. HbA1C: No results for input(s): "HGBA1C" in the last  72 hours. CBG: Recent Labs  Lab 07/23/23 2355 07/24/23 0154  GLUCAP 82 104*   Lipid Profile: No results for input(s): "CHOL", "HDL", "LDLCALC", "TRIG", "CHOLHDL", "LDLDIRECT" in the last 72 hours. Thyroid Function Tests: No results for input(s): "TSH", "T4TOTAL", "FREET4", "T3FREE", "THYROIDAB" in the last 72 hours. Anemia Panel: No results for input(s): "VITAMINB12", "FOLATE", "FERRITIN", "TIBC", "IRON ", "RETICCTPCT" in the last 72 hours. Sepsis Labs: Recent Labs  Lab 07/27/23 0415  PROCALCITON 8.84    Recent Results (from the past 240 hours)  MRSA Next Gen by PCR, Nasal     Status: None   Collection Time: 07/24/23  2:51 AM   Specimen: Nasal Mucosa; Nasal Swab  Result Value Ref Range Status   MRSA by PCR Next Gen NOT DETECTED NOT DETECTED Final    Comment: (NOTE) The GeneXpert MRSA Assay (FDA approved for NASAL specimens only), is one component of a comprehensive MRSA colonization surveillance program. It is not intended to diagnose MRSA infection nor to guide or monitor treatment for MRSA infections. Test performance is not FDA approved in patients less than 16 years old. Performed at [facility], [address] [place], [place] [address]   Culture, blood (Routine X 2) w Reflex to ID Panel     Status: Abnormal   Collection Time: 07/26/23 12:57 PM   Specimen: BLOOD  Result Value Ref Range Status   Specimen  Description   Final    BLOOD BLOOD LEFT ARM Performed at [facility], [address], [place], [place] [address]    Special Requests   Final    BOTTLES DRAWN AEROBIC AND ANAEROBIC Blood Culture adequate volume Performed at [facility], [address] [place], [place] [address]    Culture  Setup Time   Final    GRAM NEGATIVE RODS IN BOTH AEROBIC AND ANAEROBIC BOTTLES CRITICAL RESULT CALLED TO, READ BACK BY AND VERIFIED WITH:  JASON ROBINS AT 2725 07/27/23 JG GRAM STAIN REVIEWED-AGREE WITH RESULT DRT Performed at [facility], [address] [place], [place] [address]    Culture ESCHERICHIA COLI (A)  Final   Report Status 07/29/2023 FINAL  Final   Organism ID, Bacteria ESCHERICHIA COLI  Final   Organism ID, Bacteria ESCHERICHIA COLI  Final      Susceptibility   Escherichia coli - KIRBY BAUER*    CEFAZOLIN SENSITIVE Sensitive    Escherichia coli - MIC*    AMPICILLIN  4 SENSITIVE Sensitive     CEFEPIME <=0.12 SENSITIVE Sensitive     CEFTAZIDIME <=1 SENSITIVE Sensitive     CEFTRIAXONE <=0.25 SENSITIVE Sensitive     CIPROFLOXACIN <=0.25 SENSITIVE Sensitive     GENTAMICIN <=1 SENSITIVE Sensitive     IMIPENEM <=0.25 SENSITIVE Sensitive     TRIMETH/SULFA <=20 SENSITIVE Sensitive     AMPICILLIN /SULBACTAM <=2 SENSITIVE Sensitive     PIP/TAZO <=4 SENSITIVE Sensitive ug/mL    * ESCHERICHIA COLI    ESCHERICHIA COLI  Blood Culture ID Panel (Reflexed)     Status: Abnormal   Collection Time: 07/26/23 12:57 PM  Result Value Ref Range Status   Enterococcus faecalis NOT DETECTED NOT DETECTED Final   Enterococcus Faecium NOT DETECTED NOT DETECTED Final   Listeria monocytogenes NOT DETECTED NOT DETECTED Final   Staphylococcus species NOT DETECTED NOT DETECTED Final   Staphylococcus aureus (BCID) NOT DETECTED NOT DETECTED Final   Staphylococcus epidermidis NOT DETECTED NOT DETECTED Final   Staphylococcus lugdunensis NOT DETECTED NOT DETECTED Final   Streptococcus  species  NOT DETECTED NOT DETECTED Final   Streptococcus agalactiae NOT DETECTED NOT DETECTED Final   Streptococcus pneumoniae NOT DETECTED NOT DETECTED Final   Streptococcus pyogenes NOT DETECTED NOT DETECTED Final   A.calcoaceticus-baumannii NOT DETECTED NOT DETECTED Final   Bacteroides fragilis NOT DETECTED NOT DETECTED Final   Enterobacterales DETECTED (A) NOT DETECTED Final    Comment: Enterobacterales represent a large order of gram negative bacteria, not a single organism. CRITICAL RESULT CALLED TO, READ BACK BY AND VERIFIED WITH:  JASON ROBINS AT 0123 07/27/23 JG    Enterobacter cloacae complex NOT DETECTED NOT DETECTED Final   Escherichia coli DETECTED (A) NOT DETECTED Final    Comment: CRITICAL RESULT CALLED TO, READ BACK BY AND VERIFIED WITH:  JASON ROBINS AT 0123 07/27/23 JG    Klebsiella aerogenes NOT DETECTED NOT DETECTED Final   Klebsiella oxytoca NOT DETECTED NOT DETECTED Final   Klebsiella pneumoniae NOT DETECTED NOT DETECTED Final   Proteus species NOT DETECTED NOT DETECTED Final   Salmonella species NOT DETECTED NOT DETECTED Final   Serratia marcescens NOT DETECTED NOT DETECTED Final   Haemophilus influenzae NOT DETECTED NOT DETECTED Final   Neisseria meningitidis NOT DETECTED NOT DETECTED Final   Pseudomonas aeruginosa NOT DETECTED NOT DETECTED Final   Stenotrophomonas maltophilia NOT DETECTED NOT DETECTED Final   Candida albicans NOT DETECTED NOT DETECTED Final   Candida auris NOT DETECTED NOT DETECTED Final   Candida glabrata NOT DETECTED NOT DETECTED Final   Candida krusei NOT DETECTED NOT DETECTED Final   Candida parapsilosis NOT DETECTED NOT DETECTED Final   Candida tropicalis NOT DETECTED NOT DETECTED Final   Cryptococcus neoformans/gattii NOT DETECTED NOT DETECTED Final   CTX-M ESBL NOT DETECTED NOT DETECTED Final   Carbapenem resistance IMP NOT DETECTED NOT DETECTED Final   Carbapenem resistance KPC NOT DETECTED NOT DETECTED Final   Carbapenem resistance NDM  NOT DETECTED NOT DETECTED Final   Carbapenem resist OXA 48 LIKE NOT DETECTED NOT DETECTED Final   Carbapenem resistance VIM NOT DETECTED NOT DETECTED Final    Comment: Performed at [facility], [address] [place], [place] [address]  Culture, blood (Routine X 2) w Reflex to ID Panel     Status: Abnormal   Collection Time: 07/26/23  1:03 PM   Specimen: BLOOD  Result Value Ref Range Status   Specimen Description BLOOD BLOOD RIGHT ARM  Final   Special Requests   Final    BOTTLES DRAWN AEROBIC AND ANAEROBIC Blood Culture adequate volume   Culture  Setup Time   Final    GRAM NEGATIVE RODS AEROBIC BOTTLE ONLY CRITICAL VALUE NOTED.  VALUE IS CONSISTENT WITH PREVIOUSLY REPORTED AND CALLED VALUE. GRAM STAIN REVIEWED-AGREE WITH RESULT DRT    Culture (A)  Final    ESCHERICHIA COLI SUSCEPTIBILITIES PERFORMED ON PREVIOUS CULTURE WITHIN THE LAST 5 DAYS.    Report Status 07/29/2023 FINAL  Final  Urine Culture (for pregnant, neutropenic or urologic patients or patients with an indwelling urinary catheter)     Status: Abnormal   Collection Time: 07/27/23  8:49 AM   Specimen: Urine, Clean Catch  Result Value Ref Range Status   Specimen Description   Final    URINE, CLEAN CATCH Performed at [facility], [address] [place], [place] [address]    Special Requests   Final    NONE Performed at [facility], [address], [place], [place] [address]    Culture (A)  Final    <10,000 COLONIES/mL INSIGNIFICANT  GROWTH Performed at [facility], [address], [place], [place] [address]    Report Status 07/29/2023 FINAL  Final  Culture, blood (Routine X 2) w Reflex to ID Panel     Status: None (Preliminary result)   Collection Time: 07/28/23  8:23 AM   Specimen: BLOOD  Result Value Ref Range Status   Specimen Description BLOOD LEFT ANTECUBITAL  Final   Special Requests   Final    BOTTLES DRAWN AEROBIC AND ANAEROBIC Blood Culture adequate volume    Culture   Final    NO GROWTH < 24 HOURS Performed at [facility], [address], [place], [place] [address]    Report Status PENDING  Incomplete  Culture, blood (Routine X 2) w Reflex to ID Panel     Status: None (Preliminary result)   Collection Time: 07/28/23  8:31 AM   Specimen: BLOOD  Result Value Ref Range Status   Specimen Description BLOOD RIGHT ANTECUBITAL  Final   Special Requests   Final    BOTTLES DRAWN AEROBIC AND ANAEROBIC Blood Culture adequate volume   Culture   Final    NO GROWTH < 24 HOURS Performed at [facility], [address] [place], [place] [address]    Report Status PENDING  Incomplete         Radiology Studies: CT ABDOMEN PELVIS W CONTRAST Result Date: 07/28/2023 CLINICAL DATA:  Bacteremia. EXAM: CT ABDOMEN AND PELVIS WITH CONTRAST TECHNIQUE: Multidetector CT imaging of the abdomen and pelvis was performed using the standard protocol following bolus administration of intravenous contrast. RADIATION DOSE REDUCTION: This exam was performed according to the departmental dose-optimization program which includes automated exposure control, adjustment of the mA and/or kV according to patient size and/or use of iterative reconstruction technique. CONTRAST:  OMNIPAQUE  IOHEXOL  300 MG/ML  SOLN COMPARISON:  CT 06/05/2023, MRI 07/17/2023 FINDINGS: Lower chest: No basilar airspace disease or pleural effusion. Mild cardiomegaly. Hepatobiliary: No focal hepatic abnormality. Contracted gallbladder. No calcified gallstone. No biliary dilatation. Pancreas: No ductal dilatation or inflammation. Enhancing focus in the pancreatic tail, series 5, image 53 was characterized on MRI. Spleen: Normal in size without focal abnormality. Adrenals/Urinary Tract: No adrenal nodule. No hydronephrosis or renal inflammation. Nonobstructing stone in the left kidney. The urinary bladder is partially distended. No definite wall thickening, streak artifact from right hip  arthroplasty obscures detailed assessment Stomach/Bowel: No bowel obstruction or inflammation. Small to moderate colonic stool burden. The sigmoid colon is redundant. The appendix is normal. Vascular/Lymphatic: Normal caliber abdominal aorta. Aortic atherosclerosis. No enlarged lymph nodes in the abdomen or pelvis. Reproductive: Prostate is unremarkable. Other: No ascites or free air.  No abdominal wall hernia. Musculoskeletal: No acute findings. Right hip arthroplasty. No muscular findings to account for bacteremia. IMPRESSION: 1. No acute abnormality in the abdomen/pelvis. 2. Nonobstructing left renal stone. 3. Unchanged enhancing focus in the pancreatic tail, recently characterized on MRI. Aortic Atherosclerosis (ICD10-I70.0). Electronically Signed   By: Chadwick Colonel M.D.   On: 07/28/2023 20:11         Scheduled Meds:  amLODipine   10 mg Oral QHS   aspirin  EC  81 mg Oral Daily   atorvastatin   40 mg Oral QHS   carvedilol   12.5 mg Oral BID WC   cyanocobalamin   1,000 mcg Oral Daily   folic acid   1 mg Oral Daily   gabapentin  100 mg Oral BID   lacosamide   150 mg Oral BID   levETIRAcetam   1,500 mg Oral  BID   multivitamin with minerals  1 tablet Oral Daily   tamsulosin   0.4 mg Oral QHS   thiamine   100 mg Oral Daily   Or   thiamine   100 mg Intravenous Daily   Continuous Infusions:  cefTRIAXone (ROCEPHIN)  IV 2 g (07/29/23 0159)      LOS: 5 days     Raymonde Calico, MD [facility]  If 7PM-7AM, please contact night-coverage [email or web address] 07/29/2023, 1:19 PM

## 2023-07-29 NOTE — Progress Notes (Signed)
 Pt becoming agitated. Pt noted to need a full bed change with Adalina RN and while turning patient he went limp, decreased responsiveness with upperbody shaking. Pt noted to have pinpoint pupils and did respond to sternal rub by opening eyes but could not speak. Bed change completed quickly and Leshara CN notified and requested to page MD. Dr Sari Cunning paged and he returned call after seizure had stopped, aprox 4-5 mins later. Pt's sister at bedside during this time. Pt vitals within normal limits except his temp was noted to be 99.40F. Pt was able to squeeze both hands after the seizure.  Ativan  ordered for seizures lasting longer than 5 minutes and MD states he will speak with Neuro to determine further changes. Pt resting quietly now, appears to be asleep.

## 2023-07-29 NOTE — TOC Progression Note (Addendum)
 Transition of Care [facility]) - Progression Note    Patient Details  Name: John Spears MRN: [id number] Date of Birth: 12-15-1950  Transition of Care [facility]) CM/SW Contact  Odilia Bennett, LCSW Phone Number: 07/29/2023, 12:03 PM  Clinical Narrative:   No family at bedside. Tried calling sister. No answer/voicemail available. Will try again later to discuss SNF recommendation.  3:06 pm: Met with patient and sister at bedside. Reviewed bed offers. Sister would like to accept the bed offer from [employer]. Patient's niece works there. Sister stated she is not his HCPOA. Patient has a son, Steve Youngberg. CSW left him a voicemail. Need to confirm he is agreeable to plan as well.    Barriers to Discharge: Continued Medical Work up  Expected Discharge Plan and Services                                               Social Determinants of Health (SDOH) Interventions SDOH Screenings   Food Insecurity: Patient Unable To Answer (07/26/2023)  Housing: Low Risk  (04/06/2023)   Received from [facility]  Transportation Needs: Unmet Transportation Needs (04/06/2023)   Received from [facility]  Utilities: Not At Risk (04/06/2023)   Received from [facility]  Financial Resource Strain: Medium Risk (04/06/2023)   Received from [facility]  Social Connections: Unknown (03/12/2023)  Tobacco Use: Medium Risk (07/23/2023)    Readmission Risk Interventions     No data to display

## 2023-07-30 DIAGNOSIS — R569 Unspecified convulsions: Secondary | ICD-10-CM | POA: Diagnosis not present

## 2023-07-30 LAB — URINE CULTURE: Culture: 5000 — AB

## 2023-07-30 MED ORDER — SODIUM CHLORIDE 0.9% FLUSH: 10.0000 mL | INTRAVENOUS | Status: DC | PRN

## 2023-07-30 NOTE — Progress Notes (Signed)

## 2023-07-30 NOTE — Progress Notes (Addendum)
 PROGRESS NOTE   HPI was taken from Dr. Rosalea Spears: John Spears is a 73 y.o. male with medical history significant of seizure, HTN, H LD, dCHF, stroke, A fib on Xarelto , BPH, who presents with tremor.   Per her sister (I called her sister by phone), pt has not been taking his Keppra  in the past two days due to dental procedure. Pt had 4 teeth pullout. His bottom gums are oozing blood. He still has oozing blood from dental surgery. Pt developed tremors this AM which often precede his seizure activity per her sister. Pt was seen in ED this AM, and had negative work up. Pt was discharged home and instructed to return to his normal Keppra  dosing. After went home, pt developed right hand shaking, right facial droop and became unresponsive per her sister. Pt was brought back to ED again.    When I saw patient in ED, patient is mildly confused.  He knows his own name, is orientated to place, but confused about year.  He moves all extremities, no facial droop or slurred speech on my examination.  Denies chest pain, cough, SOB.  No nausea, vomiting, diarrhea or abdominal pain.  No symptoms of UTI.  No fever or chills. Of  note, pt has Keppra  1500 mg twice daily and lacosamide  150 mg twice daily on his medication list, but per his sister, patient only takes Keppra  normally.   Addendum: after pt arrived to the floor, he started seizing. Pt was given 1500 mg of Keppra , 150 mg of Vimpt and total of 6 mg of Ativan  by IV, still seizing. Suspecting status epilepticus. I ordered another 1500 mg of Keppra  to be given. Consulted ICU NP, John Spears. Pt will be transferred to SDU.       John Spears  WUJ:[id number] DOB: 1950/09/14 DOA: 07/23/2023 PCP: Patient, No Pcp Per   Assessment & Plan:   Principal Problem:   Seizure (HCC) Active Problems:   History of CVA (cerebrovascular accident)   Toxic metabolic encephalopathy   Paroxysmal atrial fibrillation (HCC)   Essential hypertension   HLD (hyperlipidemia)    Chronic diastolic CHF (congestive heart failure) (HCC)   BPH (benign prostatic hyperplasia)   Alcohol abuse   Seizures (HCC)  Assessment and Plan: Bacteremia: blood cxs growing e.coli. Source unclear, possibly urine, culture pending. Ct abdomen/pelvis nothing acute no signs infection. Repeat BCs no growth thus far. Only hardware is right hip replacement.  Bladder scan negative. Continue ceftriaxone. Per ID pharm today: "when ready for PO, my thoughts are amoxicillin  1gm TID or cefadroxil 1gm BID. I would avoid quinolone if we can d/t recent seizures. doesn't seem like dealing with uncontrolled source (based CT AP), so my thoughts are duration of 7-10 days - assuming he remains afebrile, clinically improved, etc."  Seizure: breakthrough. Secondary to noncompliance w/ meds, infection. Continue on vimpat , keppra . Continue w/ neuro checks. Neuro recs appreciated  Fever: secondary to bacteremia. resolved  Hypokalemia: resolved   Hx of CVA: continue on statin, aspirin     PAF: continue on coreg . restarted xarelto , monitor for bleeding (recent dental procedure had bleeding from that). None thus far   HTN: controlled continue on amlodipine , coreg    HLD: continue on statin     Chronic diastolic CHF: echo on 06/24/2022 showed EF 60 to 65% with grade 1 diastolic dysfunction.  Patient does not have leg edema or JVD.  Appears compensated. Holding lasix     BPH: continue on home dose of flomax . Bladder scan negative  Alcohol abuse: reports last drink 2 weeks ago so withdrawal unlikely  Normocytic anemia: H&H are labile but stable.    Debility: PT advising snf and family desires that, TOC aware and working on it     DVT prophylaxis: xarelto  Code Status: full  Family Communication : daughter at bedside 6/4. No answer when sister betty telephoned yesterday and today Disposition Plan: snf  Level of care: Progressive  Status is: Inpatient Remains inpatient appropriate because: pending snf  placement     Consultants:  Neuro  ICU  Procedures:  Antimicrobials:  Subjective: No complaints,  bm today  Objective: Vitals:   07/30/23 0500 07/30/23 0809 07/30/23 1251 07/30/23 1542  BP:  129/65 119/64 (!) 158/99  Pulse:  66 66 87  Resp:      Temp:  99.4 F (37.4 C) 99.6 F (37.6 C)   TempSrc:   Oral   SpO2:  98% 99%   Weight: 76.9 kg     Height:        Intake/Output Summary (Last 24 hours) at 07/30/2023 1600 Last data filed at 07/30/2023 0500 Gross per 24 hour  Intake 440 ml  Output 20 ml  Net 420 ml   Filed Weights   07/28/23 0500 07/29/23 0500 07/30/23 0500  Weight: 74 kg 76.3 kg 76.9 kg    Examination:  General exam: calm Respiratory system: diminished breath sounds b/l  Cardiovascular system: S1/S2+. No rubs or gallops  Gastrointestinal system: abd is soft, NT, ND & hypoactive bowel sounds Central nervous system:  oriented to self only. Moves all extremities  Psychiatry: Judgement and insight appears poor. Bit confused    Data Reviewed: I have personally reviewed following labs and imaging studies  CBC: Recent Labs  Lab 07/23/23 1836 07/24/23 0350 07/25/23 0434 07/26/23 0354 07/27/23 0415 07/28/23 0458 07/29/23 0251  WBC 6.0   < > 5.8 10.2 14.7* 15.7* 11.6*  NEUTROABS 3.4  --   --   --   --   --   --   HGB 10.0*   < > 9.1* 10.0* 8.5* 9.2* 8.3*  HCT 30.6*   < > 27.7* 31.1* 26.3* 28.5* 26.4*  MCV 89.2   < > 88.2 89.1 89.2 88.5 89.2  PLT 167   < > 153 180 147* 154 162   < > = values in this interval not displayed.   Basic Metabolic Panel: Recent Labs  Lab 07/25/23 0434 07/26/23 0354 07/27/23 0415 07/28/23 0458 07/29/23 0251  NA 142 138 139 139 140  K 3.5 3.7 3.8 3.4* 4.0  CL 109 105 106 106 109  CO2 25 22 24 25 23   GLUCOSE 89 99 116* 104* 102*  BUN 13 20 19 20 17   CREATININE 0.81 1.12 1.02 1.02 1.01  CALCIUM  9.0 9.6 8.8* 9.0 9.0   GFR: Estimated Creatinine Clearance: 71.4 mL/min (by C-G formula based on SCr of 1.01  mg/dL). Liver Function Tests: Recent Labs  Lab 07/23/23 1836  AST 27  ALT 14  ALKPHOS 59  BILITOT 0.8  PROT 7.1  ALBUMIN 3.7   No results for input(s): "LIPASE", "AMYLASE" in the last 168 hours. No results for input(s): "AMMONIA" in the last 168 hours. Coagulation Profile: Recent Labs  Lab 07/23/23 1959  INR 1.2   Cardiac Enzymes: No results for input(s): "CKTOTAL", "CKMB", "CKMBINDEX", "TROPONINI" in the last 168 hours. BNP (last 3 results) No results for input(s): "PROBNP" in the last 8760 hours. HbA1C: No results for input(s): "HGBA1C" in the last 72  hours. CBG: Recent Labs  Lab 07/23/23 2355 07/24/23 0154  GLUCAP 82 104*   Lipid Profile: No results for input(s): "CHOL", "HDL", "LDLCALC", "TRIG", "CHOLHDL", "LDLDIRECT" in the last 72 hours. Thyroid Function Tests: No results for input(s): "TSH", "T4TOTAL", "FREET4", "T3FREE", "THYROIDAB" in the last 72 hours. Anemia Panel: No results for input(s): "VITAMINB12", "FOLATE", "FERRITIN", "TIBC", "IRON ", "RETICCTPCT" in the last 72 hours. Sepsis Labs: Recent Labs  Lab 07/27/23 0415  PROCALCITON 8.84    Recent Results (from the past 240 hours)  MRSA Next Gen by PCR, Nasal     Status: None   Collection Time: 07/24/23  2:51 AM   Specimen: Nasal Mucosa; Nasal Swab  Result Value Ref Range Status   MRSA by PCR Next Gen NOT DETECTED NOT DETECTED Final    Comment: (NOTE) The GeneXpert MRSA Assay (FDA approved for NASAL specimens only), is one component of a comprehensive MRSA colonization surveillance program. It is not intended to diagnose MRSA infection nor to guide or monitor treatment for MRSA infections. Test performance is not FDA approved in patients less than 34 years old. Performed at [facility], [address] [place], [place] [address]   Culture, blood (Routine X 2) w Reflex to ID Panel     Status: Abnormal   Collection Time: 07/26/23 12:57 PM   Specimen: BLOOD  Result Value Ref Range  Status   Specimen Description   Final    BLOOD BLOOD LEFT ARM Performed at [facility], [address], [place], [place] [address]    Special Requests   Final    BOTTLES DRAWN AEROBIC AND ANAEROBIC Blood Culture adequate volume Performed at [facility], [address], [place], [place] [address]    Culture  Setup Time   Final    GRAM NEGATIVE RODS IN BOTH AEROBIC AND ANAEROBIC BOTTLES CRITICAL RESULT CALLED TO, READ BACK BY AND VERIFIED WITH:  JASON ROBINS AT 1324 07/27/23 JG GRAM STAIN REVIEWED-AGREE WITH RESULT DRT Performed at [facility], [address], [place], [place] [address]    Culture ESCHERICHIA COLI (A)  Final   Report Status 07/29/2023 FINAL  Final   Organism ID, Bacteria ESCHERICHIA COLI  Final   Organism ID, Bacteria ESCHERICHIA COLI  Final      Susceptibility   Escherichia coli - KIRBY BAUER*    CEFAZOLIN SENSITIVE Sensitive    Escherichia coli - MIC*    AMPICILLIN  4 SENSITIVE Sensitive     CEFEPIME <=0.12 SENSITIVE Sensitive     CEFTAZIDIME <=1 SENSITIVE Sensitive     CEFTRIAXONE <=0.25 SENSITIVE Sensitive     CIPROFLOXACIN <=0.25 SENSITIVE Sensitive     GENTAMICIN <=1 SENSITIVE Sensitive     IMIPENEM <=0.25 SENSITIVE Sensitive     TRIMETH/SULFA <=20 SENSITIVE Sensitive     AMPICILLIN /SULBACTAM <=2 SENSITIVE Sensitive     PIP/TAZO <=4 SENSITIVE Sensitive ug/mL    * ESCHERICHIA COLI    ESCHERICHIA COLI  Blood Culture ID Panel (Reflexed)     Status: Abnormal   Collection Time: 07/26/23 12:57 PM  Result Value Ref Range Status   Enterococcus faecalis NOT DETECTED NOT DETECTED Final   Enterococcus Faecium NOT DETECTED NOT DETECTED Final   Listeria monocytogenes NOT DETECTED NOT DETECTED Final   Staphylococcus species NOT DETECTED NOT DETECTED Final   Staphylococcus aureus (BCID) NOT DETECTED NOT DETECTED Final   Staphylococcus epidermidis NOT DETECTED NOT DETECTED Final   Staphylococcus lugdunensis NOT DETECTED NOT DETECTED Final    Streptococcus species NOT  DETECTED NOT DETECTED Final   Streptococcus agalactiae NOT DETECTED NOT DETECTED Final   Streptococcus pneumoniae NOT DETECTED NOT DETECTED Final   Streptococcus pyogenes NOT DETECTED NOT DETECTED Final   A.calcoaceticus-baumannii NOT DETECTED NOT DETECTED Final   Bacteroides fragilis NOT DETECTED NOT DETECTED Final   Enterobacterales DETECTED (A) NOT DETECTED Final    Comment: Enterobacterales represent a large order of gram negative bacteria, not a single organism. CRITICAL RESULT CALLED TO, READ BACK BY AND VERIFIED WITH:  JASON ROBINS AT 0123 07/27/23 JG    Enterobacter cloacae complex NOT DETECTED NOT DETECTED Final   Escherichia coli DETECTED (A) NOT DETECTED Final    Comment: CRITICAL RESULT CALLED TO, READ BACK BY AND VERIFIED WITH:  JASON ROBINS AT 0123 07/27/23 JG    Klebsiella aerogenes NOT DETECTED NOT DETECTED Final   Klebsiella oxytoca NOT DETECTED NOT DETECTED Final   Klebsiella pneumoniae NOT DETECTED NOT DETECTED Final   Proteus species NOT DETECTED NOT DETECTED Final   Salmonella species NOT DETECTED NOT DETECTED Final   Serratia marcescens NOT DETECTED NOT DETECTED Final   Haemophilus influenzae NOT DETECTED NOT DETECTED Final   Neisseria meningitidis NOT DETECTED NOT DETECTED Final   Pseudomonas aeruginosa NOT DETECTED NOT DETECTED Final   Stenotrophomonas maltophilia NOT DETECTED NOT DETECTED Final   Candida albicans NOT DETECTED NOT DETECTED Final   Candida auris NOT DETECTED NOT DETECTED Final   Candida glabrata NOT DETECTED NOT DETECTED Final   Candida krusei NOT DETECTED NOT DETECTED Final   Candida parapsilosis NOT DETECTED NOT DETECTED Final   Candida tropicalis NOT DETECTED NOT DETECTED Final   Cryptococcus neoformans/gattii NOT DETECTED NOT DETECTED Final   CTX-M ESBL NOT DETECTED NOT DETECTED Final   Carbapenem resistance IMP NOT DETECTED NOT DETECTED Final   Carbapenem resistance KPC NOT DETECTED NOT DETECTED Final    Carbapenem resistance NDM NOT DETECTED NOT DETECTED Final   Carbapenem resist OXA 48 LIKE NOT DETECTED NOT DETECTED Final   Carbapenem resistance VIM NOT DETECTED NOT DETECTED Final    Comment: Performed at [facility], [address] [place], [place] [address]  Culture, blood (Routine X 2) w Reflex to ID Panel     Status: Abnormal   Collection Time: 07/26/23  1:03 PM   Specimen: BLOOD  Result Value Ref Range Status   Specimen Description BLOOD BLOOD RIGHT ARM  Final   Special Requests   Final    BOTTLES DRAWN AEROBIC AND ANAEROBIC Blood Culture adequate volume   Culture  Setup Time   Final    GRAM NEGATIVE RODS AEROBIC BOTTLE ONLY CRITICAL VALUE NOTED.  VALUE IS CONSISTENT WITH PREVIOUSLY REPORTED AND CALLED VALUE. GRAM STAIN REVIEWED-AGREE WITH RESULT DRT    Culture (A)  Final    ESCHERICHIA COLI SUSCEPTIBILITIES PERFORMED ON PREVIOUS CULTURE WITHIN THE LAST 5 DAYS.    Report Status 07/29/2023 FINAL  Final  Urine Culture (for pregnant, neutropenic or urologic patients or patients with an indwelling urinary catheter)     Status: Abnormal   Collection Time: 07/27/23  8:49 AM   Specimen: Urine, Clean Catch  Result Value Ref Range Status   Specimen Description   Final    URINE, CLEAN CATCH Performed at [facility], [address] [place], [place] [address]    Special Requests   Final    NONE Performed at [facility], [address] [place], [place] [address]    Culture (A)  Final    <10,000 COLONIES/mL INSIGNIFICANT GROWTH  Performed at [facility], [address], [place], [place] [address]    Report Status 07/29/2023 FINAL  Final  Culture, blood (Routine X 2) w Reflex to ID Panel     Status: None (Preliminary result)   Collection Time: 07/28/23  8:23 AM   Specimen: BLOOD  Result Value Ref Range Status   Specimen Description BLOOD LEFT ANTECUBITAL  Final   Special Requests   Final    BOTTLES DRAWN AEROBIC AND ANAEROBIC Blood  Culture adequate volume   Culture   Final    NO GROWTH 2 DAYS Performed at [facility], [address], [place], [place] [address]    Report Status PENDING  Incomplete  Culture, blood (Routine X 2) w Reflex to ID Panel     Status: None (Preliminary result)   Collection Time: 07/28/23  8:31 AM   Specimen: BLOOD  Result Value Ref Range Status   Specimen Description BLOOD RIGHT ANTECUBITAL  Final   Special Requests   Final    BOTTLES DRAWN AEROBIC AND ANAEROBIC Blood Culture adequate volume   Culture   Final    NO GROWTH 2 DAYS Performed at [facility], [address] [place], [place] [address]    Report Status PENDING  Incomplete         Radiology Studies: CT ABDOMEN PELVIS W CONTRAST Result Date: 07/28/2023 CLINICAL DATA:  Bacteremia. EXAM: CT ABDOMEN AND PELVIS WITH CONTRAST TECHNIQUE: Multidetector CT imaging of the abdomen and pelvis was performed using the standard protocol following bolus administration of intravenous contrast. RADIATION DOSE REDUCTION: This exam was performed according to the departmental dose-optimization program which includes automated exposure control, adjustment of the mA and/or kV according to patient size and/or use of iterative reconstruction technique. CONTRAST:  OMNIPAQUE  IOHEXOL  300 MG/ML  SOLN COMPARISON:  CT 06/05/2023, MRI 07/17/2023 FINDINGS: Lower chest: No basilar airspace disease or pleural effusion. Mild cardiomegaly. Hepatobiliary: No focal hepatic abnormality. Contracted gallbladder. No calcified gallstone. No biliary dilatation. Pancreas: No ductal dilatation or inflammation. Enhancing focus in the pancreatic tail, series 5, image 53 was characterized on MRI. Spleen: Normal in size without focal abnormality. Adrenals/Urinary Tract: No adrenal nodule. No hydronephrosis or renal inflammation. Nonobstructing stone in the left kidney. The urinary bladder is partially distended. No definite wall thickening, streak artifact  from right hip arthroplasty obscures detailed assessment Stomach/Bowel: No bowel obstruction or inflammation. Small to moderate colonic stool burden. The sigmoid colon is redundant. The appendix is normal. Vascular/Lymphatic: Normal caliber abdominal aorta. Aortic atherosclerosis. No enlarged lymph nodes in the abdomen or pelvis. Reproductive: Prostate is unremarkable. Other: No ascites or free air.  No abdominal wall hernia. Musculoskeletal: No acute findings. Right hip arthroplasty. No muscular findings to account for bacteremia. IMPRESSION: 1. No acute abnormality in the abdomen/pelvis. 2. Nonobstructing left renal stone. 3. Unchanged enhancing focus in the pancreatic tail, recently characterized on MRI. Aortic Atherosclerosis (ICD10-I70.0). Electronically Signed   By: Chadwick Colonel M.D.   On: 07/28/2023 20:11         Scheduled Meds:  amLODipine   10 mg Oral QHS   aspirin  EC  81 mg Oral Daily   atorvastatin   40 mg Oral QHS   carvedilol   12.5 mg Oral BID WC   cyanocobalamin   1,000 mcg Oral Daily   folic acid   1 mg Oral Daily   gabapentin  100 mg Oral BID   lacosamide   150 mg Oral BID   levETIRAcetam   1,500 mg Oral BID  multivitamin with minerals  1 tablet Oral Daily   polyethylene glycol  17 g Oral Daily   rivaroxaban   20 mg Oral Q supper   tamsulosin   0.4 mg Oral QHS   thiamine   100 mg Oral Daily   Or   thiamine   100 mg Intravenous Daily   Continuous Infusions:  cefTRIAXone (ROCEPHIN)  IV 200 mL/hr at 07/30/23 0400      LOS: 6 days     Raymonde Calico, MD [facility]  If 7PM-7AM, please contact night-coverage [email or web address] 07/30/2023, 4:00 PM

## 2023-07-30 NOTE — TOC Progression Note (Addendum)
 Transition of Care [facility]) - Progression Note    Patient Details  Name: John Spears MRN: [id number] Date of Birth: December 10, 1950  Transition of Care [facility] CM/SW Contact  Odilia Bennett, LCSW Phone Number: 07/30/2023, 9:55 AM  Clinical Narrative:   No call back from son yet. CSW tried calling again but no answer. Did not leave a second voicemail.  3:10 pm: CSW tried calling son again. It went to voicemail after one ring.    Barriers to Discharge: Continued Medical Work up  Expected Discharge Plan and Services                                               Social Determinants of Health (SDOH) Interventions SDOH Screenings   Food Insecurity: Patient Unable To Answer (07/26/2023)  Housing: Low Risk  (04/06/2023)   Received from [facility]  Transportation Needs: Unmet Transportation Needs (04/06/2023)   Received from [facility]  Utilities: Not At Risk (04/06/2023)   Received from [facility]  Financial Resource Strain: Medium Risk (04/06/2023)   Received from [facility]  Social Connections: Unknown (03/12/2023)  Tobacco Use: Medium Risk (07/23/2023)    Readmission Risk Interventions     No data to display

## 2023-07-30 NOTE — Plan of Care (Signed)
  Problem: Clinical Measurements: Goal: Ability to maintain clinical measurements within normal limits will improve Outcome: Progressing   Problem: Nutrition: Goal: Adequate nutrition will be maintained Outcome: Progressing   Problem: Elimination: Goal: Will not experience complications related to bowel motility Outcome: Progressing   Problem: Pain Managment: Goal: General experience of comfort will improve and/or be controlled Outcome: Progressing   Problem: Skin Integrity: Goal: Risk for impaired skin integrity will decrease Outcome: Progressing

## 2023-07-30 NOTE — Plan of Care (Signed)

## 2023-07-31 DIAGNOSIS — R569 Unspecified convulsions: Secondary | ICD-10-CM | POA: Diagnosis not present

## 2023-07-31 LAB — BASIC METABOLIC PANEL WITH GFR
Anion gap: 8 (ref 5–15)
BUN: 18 mg/dL (ref 8–23)
CO2: 24 mmol/L (ref 22–32)
Calcium: 9.2 mg/dL (ref 8.9–10.3)
Chloride: 109 mmol/L (ref 98–111)
Creatinine, Ser: 0.98 mg/dL (ref 0.61–1.24)
GFR, Estimated: >60 mL/min (ref >60)
Glucose, Bld: 93 mg/dL (ref 70–99)
Potassium: 3.9 mmol/L (ref 3.5–5.1)
Sodium: 141 mmol/L (ref 135–145)

## 2023-07-31 LAB — CBC
HCT: 27.4 % — ABNORMAL LOW (ref 39.0–52.0)
Hemoglobin: 8.9 g/dL — ABNORMAL LOW (ref 13.0–17.0)
MCH: 28.3 pg (ref 26.0–34.0)
MCHC: 32.5 g/dL (ref 30.0–36.0)
MCV: 87 fL (ref 80.0–100.0)
Platelets: 221 10*3/uL (ref 150–400)
RBC: 3.15 MIL/uL — ABNORMAL LOW (ref 4.22–5.81)
RDW: 15 % (ref 11.5–15.5)
WBC: 6.2 10*3/uL (ref 4.0–10.5)
nRBC: 0 % (ref 0.0–0.2)

## 2023-07-31 MED ORDER — AMOXICILLIN 500 MG PO CAPS
1000.0000 mg | ORAL_CAPSULE | Freq: Three times a day (TID) | ORAL | Status: DC
  Administered 2023-08-01 – 2023-08-04 (×9): 1000 mg via ORAL
  Filled 2023-07-31 (×12): qty 2

## 2023-07-31 NOTE — Plan of Care (Signed)

## 2023-07-31 NOTE — Progress Notes (Signed)
 PROGRESS NOTE   HPI was taken from Dr. Rosalea Collin: John Spears is a 73 y.o. male with medical history significant of seizure, HTN, H LD, dCHF, stroke, A fib on Xarelto , BPH, who presents with tremor.   Per her sister (I called her sister by phone), pt has not been taking his Keppra  in the past two days due to dental procedure. Pt had 4 teeth pullout. His bottom gums are oozing blood. He still has oozing blood from dental surgery. Pt developed tremors this AM which often precede his seizure activity per her sister. Pt was seen in ED this AM, and had negative work up. Pt was discharged home and instructed to return to his normal Keppra  dosing. After went home, pt developed right hand shaking, right facial droop and became unresponsive per her sister. Pt was brought back to ED again.    When I saw patient in ED, patient is mildly confused.  He knows his own name, is orientated to place, but confused about year.  He moves all extremities, no facial droop or slurred speech on my examination.  Denies chest pain, cough, SOB.  No nausea, vomiting, diarrhea or abdominal pain.  No symptoms of UTI.  No fever or chills. Of  note, pt has Keppra  1500 mg twice daily and lacosamide  150 mg twice daily on his medication list, but per his sister, patient only takes Keppra  normally.   Addendum: after pt arrived to the floor, he started seizing. Pt was given 1500 mg of Keppra , 150 mg of Vimpt and total of 6 mg of Ativan  by IV, still seizing. Suspecting status epilepticus. I ordered another 1500 mg of Keppra  to be given. Consulted ICU NP, O[place]. Pt will be transferred to SDU.       John Spears  ZOX:[id number] DOB: 1950-03-26 DOA: 07/23/2023 PCP: Patient, No Pcp Per   Assessment & Plan:   Principal Problem:   Seizure (HCC) Active Problems:   History of CVA (cerebrovascular accident)   Toxic metabolic encephalopathy   Paroxysmal atrial fibrillation (HCC)   Essential hypertension   HLD (hyperlipidemia)    Chronic diastolic CHF (congestive heart failure) (HCC)   BPH (benign prostatic hyperplasia)   Alcohol abuse   Seizures (HCC)  Assessment and Plan: Bacteremia: blood cxs growing e.coli. Source unclear, possibly urine, culture pending. Ct abdomen/pelvis nothing acute no signs infection. Repeat BCs no growth thus far. Only hardware is right hip replacement.  Urine culture did not grow e coli (and insignificant growth). Source unclear. Bladder scan negative. Will transition to amoxicillin  1 g tid today, plan for 10 days treatment (so amox 6/8-6/12)  Seizure: breakthrough. Secondary to noncompliance w/ meds, infection. Continue on vimpat , keppra . Continue w/ neuro checks. Neuro recs appreciated  Fever: secondary to bacteremia. resolved  Hypokalemia: resolved   Hx of CVA: continue on statin, aspirin     PAF: continue on coreg . restarted xarelto , monitor for bleeding (recent dental procedure had bleeding from that). None thus far   HTN: controlled continue on amlodipine , coreg    HLD: continue on statin     Chronic diastolic CHF: echo on 06/24/2022 showed EF 60 to 65% with grade 1 diastolic dysfunction.  Patient does not have leg edema or JVD.  Appears compensated. Holding lasix     BPH: continue on home dose of flomax . Bladder scan negative   Alcohol abuse: reports last drink >2 weeks ago so withdrawal unlikely  Normocytic anemia: H&H are labile but stable.    Debility: PT advising snf and family  desires that, TOC aware and working on it     DVT prophylaxis: xarelto  Code Status: full  Family Communication : daughter at bedside 6/4. No answer when sister betty or son Costas telephoned today Disposition Plan: snf  Level of care: Progressive  Status is: Inpatient Remains inpatient appropriate because: pending snf placement     Consultants:  Neuro  ICU  Procedures:  Antimicrobials:  Subjective: No complaints,  tolerated breakfast  Objective: Vitals:   07/31/23 0333  07/31/23 0459 07/31/23 0910 07/31/23 1150  BP: 113/71  (!) 150/72 129/68  Pulse: 66  76 60  Resp: 19  18 18   Temp: (!) 97.5 F (36.4 C)  98 F (36.7 C) 98.3 F (36.8 C)  TempSrc:      SpO2: 99%  100% 99%  Weight:  72.4 kg    Height:        Intake/Output Summary (Last 24 hours) at 07/31/2023 1352 Last data filed at 07/31/2023 1150 Gross per 24 hour  Intake 440 ml  Output 1000 ml  Net -560 ml   Filed Weights   07/29/23 0500 07/30/23 0500 07/31/23 0459  Weight: 76.3 kg 76.9 kg 72.4 kg    Examination:  General exam: calm Respiratory system: diminished breath sounds b/l  Cardiovascular system: S1/S2+. No rubs or gallops  Gastrointestinal system: abd is soft, NT, ND   Central nervous system:  oriented to self only. Moves all extremities  Psychiatry: Judgement and insight appears poor. Bit confused. calm    Data Reviewed: I have personally reviewed following labs and imaging studies  CBC: Recent Labs  Lab 07/26/23 0354 07/27/23 0415 07/28/23 0458 07/29/23 0251 07/31/23 0420  WBC 10.2 14.7* 15.7* 11.6* 6.2  HGB 10.0* 8.5* 9.2* 8.3* 8.9*  HCT 31.1* 26.3* 28.5* 26.4* 27.4*  MCV 89.1 89.2 88.5 89.2 87.0  PLT 180 147* 154 162 221   Basic Metabolic Panel: Recent Labs  Lab 07/26/23 0354 07/27/23 0415 07/28/23 0458 07/29/23 0251 07/31/23 0420  NA 138 139 139 140 141  K 3.7 3.8 3.4* 4.0 3.9  CL 105 106 106 109 109  CO2 22 24 25 23 24   GLUCOSE 99 116* 104* 102* 93  BUN 20 19 20 17 18   CREATININE 1.12 1.02 1.02 1.01 0.98  CALCIUM  9.6 8.8* 9.0 9.0 9.2   GFR: Estimated Creatinine Clearance: 70.8 mL/min (by C-G formula based on SCr of 0.98 mg/dL). Liver Function Tests: No results for input(s): "AST", "ALT", "ALKPHOS", "BILITOT", "PROT", "ALBUMIN" in the last 168 hours.  No results for input(s): "LIPASE", "AMYLASE" in the last 168 hours. No results for input(s): "AMMONIA" in the last 168 hours. Coagulation Profile: No results for input(s): "INR", "PROTIME" in the  last 168 hours.  Cardiac Enzymes: No results for input(s): "CKTOTAL", "CKMB", "CKMBINDEX", "TROPONINI" in the last 168 hours. BNP (last 3 results) No results for input(s): "PROBNP" in the last 8760 hours. HbA1C: No results for input(s): "HGBA1C" in the last 72 hours. CBG: No results for input(s): "GLUCAP" in the last 168 hours.  Lipid Profile: No results for input(s): "CHOL", "HDL", "LDLCALC", "TRIG", "CHOLHDL", "LDLDIRECT" in the last 72 hours. Thyroid Function Tests: No results for input(s): "TSH", "T4TOTAL", "FREET4", "T3FREE", "THYROIDAB" in the last 72 hours. Anemia Panel: No results for input(s): "VITAMINB12", "FOLATE", "FERRITIN", "TIBC", "IRON ", "RETICCTPCT" in the last 72 hours. Sepsis Labs: Recent Labs  Lab 07/27/23 0415  PROCALCITON 8.84    Recent Results (from the past 240 hours)  MRSA Next Gen by PCR, Nasal  Status: None   Collection Time: 07/24/23  2:51 AM   Specimen: Nasal Mucosa; Nasal Swab  Result Value Ref Range Status   MRSA by PCR Next Gen NOT DETECTED NOT DETECTED Final    Comment: (NOTE) The GeneXpert MRSA Assay (FDA approved for NASAL specimens only), is one component of a comprehensive MRSA colonization surveillance program. It is not intended to diagnose MRSA infection nor to guide or monitor treatment for MRSA infections. Test performance is not FDA approved in patients less than 65 years old. Performed at [facility], [place], [address] [place], [place] [address]   Culture, blood (Routine X 2) w Reflex to ID Panel     Status: Abnormal   Collection Time: 07/26/23 12:57 PM   Specimen: BLOOD  Result Value Ref Range Status   Specimen Description   Final    BLOOD BLOOD LEFT ARM Performed at [facility], [address] [place], [place] [address]    Special Requests   Final    BOTTLES DRAWN AEROBIC AND ANAEROBIC Blood Culture adequate volume Performed at [facility], [address] [place], [place] [address]     Culture  Setup Time   Final    GRAM NEGATIVE RODS IN BOTH AEROBIC AND ANAEROBIC BOTTLES CRITICAL RESULT CALLED TO, READ BACK BY AND VERIFIED WITH:  JASON ROBINS AT 9147 07/27/23 JG GRAM STAIN REVIEWED-AGREE WITH RESULT DRT Performed at [facility], [address], [place], [place] [address]    Culture ESCHERICHIA COLI (A)  Final   Report Status 07/29/2023 FINAL  Final   Organism ID, Bacteria ESCHERICHIA COLI  Final   Organism ID, Bacteria ESCHERICHIA COLI  Final      Susceptibility   Escherichia coli - KIRBY BAUER*    CEFAZOLIN SENSITIVE Sensitive    Escherichia coli - MIC*    AMPICILLIN  4 SENSITIVE Sensitive     CEFEPIME <=0.12 SENSITIVE Sensitive     CEFTAZIDIME <=1 SENSITIVE Sensitive     CEFTRIAXONE  <=0.25 SENSITIVE Sensitive     CIPROFLOXACIN <=0.25 SENSITIVE Sensitive     GENTAMICIN <=1 SENSITIVE Sensitive     IMIPENEM <=0.25 SENSITIVE Sensitive     TRIMETH/SULFA <=20 SENSITIVE Sensitive     AMPICILLIN /SULBACTAM <=2 SENSITIVE Sensitive     PIP/TAZO <=4 SENSITIVE Sensitive ug/mL    * ESCHERICHIA COLI    ESCHERICHIA COLI  Blood Culture ID Panel (Reflexed)     Status: Abnormal   Collection Time: 07/26/23 12:57 PM  Result Value Ref Range Status   Enterococcus faecalis NOT DETECTED NOT DETECTED Final   Enterococcus Faecium NOT DETECTED NOT DETECTED Final   Listeria monocytogenes NOT DETECTED NOT DETECTED Final   Staphylococcus species NOT DETECTED NOT DETECTED Final   Staphylococcus aureus (BCID) NOT DETECTED NOT DETECTED Final   Staphylococcus epidermidis NOT DETECTED NOT DETECTED Final   Staphylococcus lugdunensis NOT DETECTED NOT DETECTED Final   Streptococcus species NOT DETECTED NOT DETECTED Final   Streptococcus agalactiae NOT DETECTED NOT DETECTED Final   Streptococcus pneumoniae NOT DETECTED NOT DETECTED Final   Streptococcus pyogenes NOT DETECTED NOT DETECTED Final   A.calcoaceticus-baumannii NOT DETECTED NOT DETECTED Final   Bacteroides fragilis NOT DETECTED  NOT DETECTED Final   Enterobacterales DETECTED (A) NOT DETECTED Final    Comment: Enterobacterales represent a large order of gram negative bacteria, not a single organism. CRITICAL RESULT CALLED TO, READ BACK BY AND VERIFIED WITH:  JASON ROBINS AT 0123 07/27/23 JG    Enterobacter cloacae complex NOT DETECTED NOT  DETECTED Final   Escherichia coli DETECTED (A) NOT DETECTED Final    Comment: CRITICAL RESULT CALLED TO, READ BACK BY AND VERIFIED WITH:  JASON ROBINS AT 0123 07/27/23 JG    Klebsiella aerogenes NOT DETECTED NOT DETECTED Final   Klebsiella oxytoca NOT DETECTED NOT DETECTED Final   Klebsiella pneumoniae NOT DETECTED NOT DETECTED Final   Proteus species NOT DETECTED NOT DETECTED Final   Salmonella species NOT DETECTED NOT DETECTED Final   Serratia marcescens NOT DETECTED NOT DETECTED Final   Haemophilus influenzae NOT DETECTED NOT DETECTED Final   Neisseria meningitidis NOT DETECTED NOT DETECTED Final   Pseudomonas aeruginosa NOT DETECTED NOT DETECTED Final   Stenotrophomonas maltophilia NOT DETECTED NOT DETECTED Final   Candida albicans NOT DETECTED NOT DETECTED Final   Candida auris NOT DETECTED NOT DETECTED Final   Candida glabrata NOT DETECTED NOT DETECTED Final   Candida krusei NOT DETECTED NOT DETECTED Final   Candida parapsilosis NOT DETECTED NOT DETECTED Final   Candida tropicalis NOT DETECTED NOT DETECTED Final   Cryptococcus neoformans/gattii NOT DETECTED NOT DETECTED Final   CTX-M ESBL NOT DETECTED NOT DETECTED Final   Carbapenem resistance IMP NOT DETECTED NOT DETECTED Final   Carbapenem resistance KPC NOT DETECTED NOT DETECTED Final   Carbapenem resistance NDM NOT DETECTED NOT DETECTED Final   Carbapenem resist OXA 48 LIKE NOT DETECTED NOT DETECTED Final   Carbapenem resistance VIM NOT DETECTED NOT DETECTED Final    Comment: Performed at [facility], [address] [place], [place] [address]  Culture, blood (Routine X 2) w Reflex to ID Panel     Status:  Abnormal   Collection Time: 07/26/23  1:03 PM   Specimen: BLOOD  Result Value Ref Range Status   Specimen Description BLOOD BLOOD RIGHT ARM  Final   Special Requests   Final    BOTTLES DRAWN AEROBIC AND ANAEROBIC Blood Culture adequate volume   Culture  Setup Time   Final    GRAM NEGATIVE RODS AEROBIC BOTTLE ONLY CRITICAL VALUE NOTED.  VALUE IS CONSISTENT WITH PREVIOUSLY REPORTED AND CALLED VALUE. GRAM STAIN REVIEWED-AGREE WITH RESULT DRT    Culture (A)  Final    ESCHERICHIA COLI SUSCEPTIBILITIES PERFORMED ON PREVIOUS CULTURE WITHIN THE LAST 5 DAYS.    Report Status 07/29/2023 FINAL  Final  Urine Culture (for pregnant, neutropenic or urologic patients or patients with an indwelling urinary catheter)     Status: Abnormal   Collection Time: 07/27/23  8:49 AM   Specimen: Urine, Clean Catch  Result Value Ref Range Status   Specimen Description   Final    URINE, CLEAN CATCH Performed at [facility], [address] [place], [place] [address]    Special Requests   Final    NONE Performed at [facility], [address] [place], [place] [address]    Culture (A)  Final    5,000 COLONIES/mL STAPHYLOCOCCUS EPIDERMIDIS 1,000 COLONIES/mL ENTEROCOCCUS FAECALIS CALL MICROBIOLOGY LAB IF SENSITIVITIES ARE REQUIRED. Performed at [facility], [address], [place], [place] [address]    Report Status 07/30/2023 FINAL  Final  Culture, blood (Routine X 2) w Reflex to ID Panel     Status: None (Preliminary result)   Collection Time: 07/28/23  8:23 AM   Specimen: BLOOD  Result Value Ref Range Status   Specimen Description BLOOD LEFT ANTECUBITAL  Final   Special Requests   Final    BOTTLES DRAWN AEROBIC AND ANAEROBIC Blood Culture adequate volume  Culture   Final    NO GROWTH 3 DAYS Performed at [facility], [address] [place], [place] [address]    Report Status PENDING  Incomplete  Culture, blood (Routine X 2) w Reflex to ID Panel     Status:  None (Preliminary result)   Collection Time: 07/28/23  8:31 AM   Specimen: BLOOD  Result Value Ref Range Status   Specimen Description BLOOD RIGHT ANTECUBITAL  Final   Special Requests   Final    BOTTLES DRAWN AEROBIC AND ANAEROBIC Blood Culture adequate volume   Culture   Final    NO GROWTH 3 DAYS Performed at [facility], [address], [place], [place] [address]    Report Status PENDING  Incomplete         Radiology Studies: No results found.        Scheduled Meds:  amLODipine   10 mg Oral QHS   aspirin  EC  81 mg Oral Daily   atorvastatin   40 mg Oral QHS   carvedilol   12.5 mg Oral BID WC   cyanocobalamin   1,000 mcg Oral Daily   folic acid   1 mg Oral Daily   gabapentin   100 mg Oral BID   lacosamide   150 mg Oral BID   levETIRAcetam   1,500 mg Oral BID   multivitamin with minerals  1 tablet Oral Daily   polyethylene glycol  17 g Oral Daily   rivaroxaban   20 mg Oral Q supper   tamsulosin   0.4 mg Oral QHS   thiamine   100 mg Oral Daily   Or   thiamine   100 mg Intravenous Daily   Continuous Infusions:  cefTRIAXone  (ROCEPHIN )  IV 2 g (07/31/23 0220)      LOS: 7 days     Raymonde Calico, MD [facility]  If 7PM-7AM, please contact night-coverage [email or web address] 07/31/2023, 1:52 PM

## 2023-08-01 DIAGNOSIS — R569 Unspecified convulsions: Secondary | ICD-10-CM | POA: Diagnosis not present

## 2023-08-01 LAB — BASIC METABOLIC PANEL WITH GFR
Anion gap: 6 (ref 5–15)
BUN: 19 mg/dL (ref 8–23)
CO2: 24 mmol/L (ref 22–32)
Calcium: 9.3 mg/dL (ref 8.9–10.3)
Chloride: 108 mmol/L (ref 98–111)
Creatinine, Ser: 0.98 mg/dL (ref 0.61–1.24)
GFR, Estimated: >60 mL/min (ref >60)
Glucose, Bld: 86 mg/dL (ref 70–99)
Potassium: 4.2 mmol/L (ref 3.5–5.1)
Sodium: 138 mmol/L (ref 135–145)

## 2023-08-01 LAB — CBC
HCT: 26.4 % — ABNORMAL LOW (ref 39.0–52.0)
Hemoglobin: 8.5 g/dL — ABNORMAL LOW (ref 13.0–17.0)
MCH: 27.7 pg (ref 26.0–34.0)
MCHC: 32.2 g/dL (ref 30.0–36.0)
MCV: 86 fL (ref 80.0–100.0)
Platelets: 258 10*3/uL (ref 150–400)
RBC: 3.07 MIL/uL — ABNORMAL LOW (ref 4.22–5.81)
RDW: 14.9 % (ref 11.5–15.5)
WBC: 7 10*3/uL (ref 4.0–10.5)
nRBC: 0 % (ref 0.0–0.2)

## 2023-08-01 NOTE — Progress Notes (Addendum)
 PROGRESS NOTE   HPI was taken from Dr. Rosalea Collin: John Spears is a 73 y.o. male with medical history significant of seizure, HTN, H LD, dCHF, stroke, A fib on Xarelto , BPH, who presents with tremor.   Per her sister (I called her sister by phone), pt has not been taking his Keppra  in the past two days due to dental procedure. Pt had 4 teeth pullout. His bottom gums are oozing blood. He still has oozing blood from dental surgery. Pt developed tremors this AM which often precede his seizure activity per her sister. Pt was seen in ED this AM, and had negative work up. Pt was discharged home and instructed to return to his normal Keppra  dosing. After went home, pt developed right hand shaking, right facial droop and became unresponsive per her sister. Pt was brought back to ED again.    When I saw patient in ED, patient is mildly confused.  He knows his own name, is orientated to place, but confused about year.  He moves all extremities, no facial droop or slurred speech on my examination.  Denies chest pain, cough, SOB.  No nausea, vomiting, diarrhea or abdominal pain.  No symptoms of UTI.  No fever or chills. Of  note, pt has Keppra  1500 mg twice daily and lacosamide  150 mg twice daily on his medication list, but per his sister, patient only takes Keppra  normally.   Addendum: after pt arrived to the floor, he started seizing. Pt was given 1500 mg of Keppra , 150 mg of Vimpt and total of 6 mg of Ativan  by IV, still seizing. Suspecting status epilepticus. I ordered another 1500 mg of Keppra  to be given. Consulted ICU NP, Ouma. Pt will be transferred to SDU.       John Spears  ZOX:[id number] DOB: [birth year] DOA: 07/23/2023 PCP: Patient, No Pcp Per   Assessment & Plan:   Principal Problem:   Seizure (HCC) Active Problems:   History of CVA (cerebrovascular accident)   Toxic metabolic encephalopathy   Paroxysmal atrial fibrillation (HCC)   Essential hypertension   HLD (hyperlipidemia)    Chronic diastolic CHF (congestive heart failure) (HCC)   BPH (benign prostatic hyperplasia)   Alcohol abuse   Seizures (HCC)  Assessment and Plan:  Bacteremia: blood cxs growing e.coli. Source unclear, possibly urine, culture pending. Ct abdomen/pelvis nothing acute no signs infection. Repeat BCs no growth thus far. Only hardware is right hip replacement.  Urine culture did not grow e coli (and insignificant growth). Source unclear. Bladder scan negative. Have transitioned to amoxicillin  1 g tid , plan for 10 days treatment (so amox 6/8-6/12)  Seizure: breakthrough. Secondary to noncompliance w/ meds, infection. Continue on vimpat , keppra .  Neuro recs appreciated  Fever: secondary to bacteremia. resolved  Hypokalemia: resolved   Hx of CVA: continue on statin, aspirin     PAF: continue on coreg . restarted xarelto , monitor for bleeding (recent dental procedure had bleeding from that) - none thus far   HTN: controlled continue on amlodipine , coreg    HLD: continue on statin     Chronic diastolic CHF: echo on 06/24/2022 showed EF 60 to 65% with grade 1 diastolic dysfunction.  Patient does not have leg edema or JVD.  Appears compensated. Holding lasix     BPH: continue on home dose of flomax . Bladder scan negative   Alcohol abuse: reports last drink >2 weeks ago so withdrawal unlikely  Normocytic anemia: H&H are labile but stable.    Debility: PT advising snf and family desires  that, TOC aware and working on it     DVT prophylaxis: xarelto  Code Status: full  Family Communication: Niece Ninette Basque updated telephonically 6/8. Niece Lavera Postal updated telephonically 6/8. Disposition Plan: snf  Level of care: Progressive  Status is: Inpatient Remains inpatient appropriate because: pending snf placement     Consultants:  Neuro  ICU  Procedures:  Antimicrobials:  Subjective: No complaints,  tolerated breakfast and lunch. BM today  Objective: Vitals:   07/31/23 2355 08/01/23 0500  08/01/23 0954 08/01/23 1215  BP: 131/70  137/68 (!) 140/74  Pulse: 60  66 64  Resp: 16  16 20   Temp: 98.1 F (36.7 C)  98.3 F (36.8 C) 98 F (36.7 C)  TempSrc: Oral  Oral   SpO2: 100%  99% 100%  Weight:  73.8 kg    Height:        Intake/Output Summary (Last 24 hours) at 08/01/2023 1407 Last data filed at 08/01/2023 0358 Gross per 24 hour  Intake 120 ml  Output 450 ml  Net -330 ml   Filed Weights   07/30/23 0500 07/31/23 0459 08/01/23 0500  Weight: 76.9 kg 72.4 kg 73.8 kg    Examination:  General exam: calm Respiratory system: diminished breath sounds b/l  Cardiovascular system: S1/S2+. No rubs or gallops  Gastrointestinal system: abd is soft, NT, ND   Central nervous system:  oriented to self only. Moves all extremities  Psychiatry: Judgement and insight appears poor. Bit confused. calm    Data Reviewed: I have personally reviewed following labs and imaging studies  CBC: Recent Labs  Lab 07/27/23 0415 07/28/23 0458 07/29/23 0251 07/31/23 0420 08/01/23 0419  WBC 14.7* 15.7* 11.6* 6.2 7.0  HGB 8.5* 9.2* 8.3* 8.9* 8.5*  HCT 26.3* 28.5* 26.4* 27.4* 26.4*  MCV 89.2 88.5 89.2 87.0 86.0  PLT 147* 154 162 221 258   Basic Metabolic Panel: Recent Labs  Lab 07/27/23 0415 07/28/23 0458 07/29/23 0251 07/31/23 0420 08/01/23 0419  NA 139 139 140 141 138  K 3.8 3.4* 4.0 3.9 4.2  CL 106 106 109 109 108  CO2 24 25 23 24 24   GLUCOSE 116* 104* 102* 93 86  BUN 19 20 17 18 19   CREATININE 1.02 1.02 1.01 0.98 0.98  CALCIUM  8.8* 9.0 9.0 9.2 9.3   GFR: Estimated Creatinine Clearance: 72.2 mL/min (by C-G formula based on SCr of 0.98 mg/dL). Liver Function Tests: No results for input(s): "AST", "ALT", "ALKPHOS", "BILITOT", "PROT", "ALBUMIN" in the last 168 hours.  No results for input(s): "LIPASE", "AMYLASE" in the last 168 hours. No results for input(s): "AMMONIA" in the last 168 hours. Coagulation Profile: No results for input(s): "INR", "PROTIME" in the last 168  hours.  Cardiac Enzymes: No results for input(s): "CKTOTAL", "CKMB", "CKMBINDEX", "TROPONINI" in the last 168 hours. BNP (last 3 results) No results for input(s): "PROBNP" in the last 8760 hours. HbA1C: No results for input(s): "HGBA1C" in the last 72 hours. CBG: No results for input(s): "GLUCAP" in the last 168 hours.  Lipid Profile: No results for input(s): "CHOL", "HDL", "LDLCALC", "TRIG", "CHOLHDL", "LDLDIRECT" in the last 72 hours. Thyroid Function Tests: No results for input(s): "TSH", "T4TOTAL", "FREET4", "T3FREE", "THYROIDAB" in the last 72 hours. Anemia Panel: No results for input(s): "VITAMINB12", "FOLATE", "FERRITIN", "TIBC", "IRON ", "RETICCTPCT" in the last 72 hours. Sepsis Labs: Recent Labs  Lab 07/27/23 0415  PROCALCITON 8.84    Recent Results (from the past 240 hours)  MRSA Next Gen by PCR, Nasal     Status:  None   Collection Time: 07/24/23  2:51 AM   Specimen: Nasal Mucosa; Nasal Swab  Result Value Ref Range Status   MRSA by PCR Next Gen NOT DETECTED NOT DETECTED Final    Comment: (NOTE) The GeneXpert MRSA Assay (FDA approved for NASAL specimens only), is one component of a comprehensive MRSA colonization surveillance program. It is not intended to diagnose MRSA infection nor to guide or monitor treatment for MRSA infections. Test performance is not FDA approved in patients less than 68 years old. Performed at [facility], [address] [place], [place] [address]   Culture, blood (Routine X 2) w Reflex to ID Panel     Status: Abnormal   Collection Time: 07/26/23 12:57 PM   Specimen: BLOOD  Result Value Ref Range Status   Specimen Description   Final    BLOOD BLOOD LEFT ARM Performed at [facility], [address], [place], [place] [address]    Special Requests   Final    BOTTLES DRAWN AEROBIC AND ANAEROBIC Blood Culture adequate volume Performed at [facility], [address], [place], [place] [address]    Culture   Setup Time   Final    GRAM NEGATIVE RODS IN BOTH AEROBIC AND ANAEROBIC BOTTLES CRITICAL RESULT CALLED TO, READ BACK BY AND VERIFIED WITH:  JASON ROBINS AT 9562 07/27/23 JG GRAM STAIN REVIEWED-AGREE WITH RESULT DRT Performed at [facility], [address] [place], [place] [address]    Culture ESCHERICHIA COLI (A)  Final   Report Status 07/29/2023 FINAL  Final   Organism ID, Bacteria ESCHERICHIA COLI  Final   Organism ID, Bacteria ESCHERICHIA COLI  Final      Susceptibility   Escherichia coli - KIRBY BAUER*    CEFAZOLIN SENSITIVE Sensitive    Escherichia coli - MIC*    AMPICILLIN  4 SENSITIVE Sensitive     CEFEPIME <=0.12 SENSITIVE Sensitive     CEFTAZIDIME <=1 SENSITIVE Sensitive     CEFTRIAXONE  <=0.25 SENSITIVE Sensitive     CIPROFLOXACIN <=0.25 SENSITIVE Sensitive     GENTAMICIN <=1 SENSITIVE Sensitive     IMIPENEM <=0.25 SENSITIVE Sensitive     TRIMETH/SULFA <=20 SENSITIVE Sensitive     AMPICILLIN /SULBACTAM <=2 SENSITIVE Sensitive     PIP/TAZO <=4 SENSITIVE Sensitive ug/mL    * ESCHERICHIA COLI    ESCHERICHIA COLI  Blood Culture ID Panel (Reflexed)     Status: Abnormal   Collection Time: 07/26/23 12:57 PM  Result Value Ref Range Status   Enterococcus faecalis NOT DETECTED NOT DETECTED Final   Enterococcus Faecium NOT DETECTED NOT DETECTED Final   Listeria monocytogenes NOT DETECTED NOT DETECTED Final   Staphylococcus species NOT DETECTED NOT DETECTED Final   Staphylococcus aureus (BCID) NOT DETECTED NOT DETECTED Final   Staphylococcus epidermidis NOT DETECTED NOT DETECTED Final   Staphylococcus lugdunensis NOT DETECTED NOT DETECTED Final   Streptococcus species NOT DETECTED NOT DETECTED Final   Streptococcus agalactiae NOT DETECTED NOT DETECTED Final   Streptococcus pneumoniae NOT DETECTED NOT DETECTED Final   Streptococcus pyogenes NOT DETECTED NOT DETECTED Final   A.calcoaceticus-baumannii NOT DETECTED NOT DETECTED Final   Bacteroides fragilis NOT DETECTED NOT  DETECTED Final   Enterobacterales DETECTED (A) NOT DETECTED Final    Comment: Enterobacterales represent a large order of gram negative bacteria, not a single organism. CRITICAL RESULT CALLED TO, READ BACK BY AND VERIFIED WITH:  JASON ROBINS AT 0123 07/27/23 JG    Enterobacter cloacae complex NOT DETECTED NOT DETECTED  Final   Escherichia coli DETECTED (A) NOT DETECTED Final    Comment: CRITICAL RESULT CALLED TO, READ BACK BY AND VERIFIED WITH:  JASON ROBINS AT 0123 07/27/23 JG    Klebsiella aerogenes NOT DETECTED NOT DETECTED Final   Klebsiella oxytoca NOT DETECTED NOT DETECTED Final   Klebsiella pneumoniae NOT DETECTED NOT DETECTED Final   Proteus species NOT DETECTED NOT DETECTED Final   Salmonella species NOT DETECTED NOT DETECTED Final   Serratia marcescens NOT DETECTED NOT DETECTED Final   Haemophilus influenzae NOT DETECTED NOT DETECTED Final   Neisseria meningitidis NOT DETECTED NOT DETECTED Final   Pseudomonas aeruginosa NOT DETECTED NOT DETECTED Final   Stenotrophomonas maltophilia NOT DETECTED NOT DETECTED Final   Candida albicans NOT DETECTED NOT DETECTED Final   Candida auris NOT DETECTED NOT DETECTED Final   Candida glabrata NOT DETECTED NOT DETECTED Final   Candida krusei NOT DETECTED NOT DETECTED Final   Candida parapsilosis NOT DETECTED NOT DETECTED Final   Candida tropicalis NOT DETECTED NOT DETECTED Final   Cryptococcus neoformans/gattii NOT DETECTED NOT DETECTED Final   CTX-M ESBL NOT DETECTED NOT DETECTED Final   Carbapenem resistance IMP NOT DETECTED NOT DETECTED Final   Carbapenem resistance KPC NOT DETECTED NOT DETECTED Final   Carbapenem resistance NDM NOT DETECTED NOT DETECTED Final   Carbapenem resist OXA 48 LIKE NOT DETECTED NOT DETECTED Final   Carbapenem resistance VIM NOT DETECTED NOT DETECTED Final    Comment: Performed at [facility], [address] [place], [place] [address]  Culture, blood (Routine X 2) w Reflex to ID Panel     Status:  Abnormal   Collection Time: 07/26/23  1:03 PM   Specimen: BLOOD  Result Value Ref Range Status   Specimen Description BLOOD BLOOD RIGHT ARM  Final   Special Requests   Final    BOTTLES DRAWN AEROBIC AND ANAEROBIC Blood Culture adequate volume   Culture  Setup Time   Final    GRAM NEGATIVE RODS AEROBIC BOTTLE ONLY CRITICAL VALUE NOTED.  VALUE IS CONSISTENT WITH PREVIOUSLY REPORTED AND CALLED VALUE. GRAM STAIN REVIEWED-AGREE WITH RESULT DRT    Culture (A)  Final    ESCHERICHIA COLI SUSCEPTIBILITIES PERFORMED ON PREVIOUS CULTURE WITHIN THE LAST 5 DAYS.    Report Status 07/29/2023 FINAL  Final  Urine Culture (for pregnant, neutropenic or urologic patients or patients with an indwelling urinary catheter)     Status: Abnormal   Collection Time: 07/27/23  8:49 AM   Specimen: Urine, Clean Catch  Result Value Ref Range Status   Specimen Description   Final    URINE, CLEAN CATCH Performed at [facility], [address] [place], [place] [address]    Special Requests   Final    NONE Performed at [facility], [address], [place], [place] [address]    Culture (A)  Final    5,000 COLONIES/mL STAPHYLOCOCCUS EPIDERMIDIS 1,000 COLONIES/mL ENTEROCOCCUS FAECALIS CALL MICROBIOLOGY LAB IF SENSITIVITIES ARE REQUIRED. Performed at [facility], [address] [place], [place] [address]    Report Status 07/30/2023 FINAL  Final  Culture, blood (Routine X 2) w Reflex to ID Panel     Status: None (Preliminary result)   Collection Time: 07/28/23  8:23 AM   Specimen: BLOOD  Result Value Ref Range Status   Specimen Description BLOOD LEFT ANTECUBITAL  Final   Special Requests   Final    BOTTLES DRAWN AEROBIC AND ANAEROBIC Blood Culture adequate volume  Culture   Final    NO GROWTH 4 DAYS Performed at [facility], [address] [place], [place] [address]    Report Status PENDING  Incomplete  Culture, blood (Routine X 2) w Reflex to ID Panel     Status:  None (Preliminary result)   Collection Time: 07/28/23  8:31 AM   Specimen: BLOOD  Result Value Ref Range Status   Specimen Description BLOOD RIGHT ANTECUBITAL  Final   Special Requests   Final    BOTTLES DRAWN AEROBIC AND ANAEROBIC Blood Culture adequate volume   Culture   Final    NO GROWTH 4 DAYS Performed at [facility], [address] [place], [place] [address]    Report Status PENDING  Incomplete         Radiology Studies: No results found.        Scheduled Meds:  amLODipine   10 mg Oral QHS   amoxicillin   1,000 mg Oral Q8H   aspirin  EC  81 mg Oral Daily   atorvastatin   40 mg Oral QHS   carvedilol   12.5 mg Oral BID WC   cyanocobalamin   1,000 mcg Oral Daily   folic acid   1 mg Oral Daily   gabapentin   100 mg Oral BID   lacosamide   150 mg Oral BID   levETIRAcetam   1,500 mg Oral BID   multivitamin with minerals  1 tablet Oral Daily   polyethylene glycol  17 g Oral Daily   rivaroxaban   20 mg Oral Q supper   tamsulosin   0.4 mg Oral QHS   thiamine   100 mg Oral Daily   Or   thiamine   100 mg Intravenous Daily   Continuous Infusions:      LOS: 8 days     Raymonde Calico, MD [facility]  If 7PM-7AM, please contact night-coverage [email or web address] 08/01/2023, 2:07 PM

## 2023-08-02 DIAGNOSIS — R569 Unspecified convulsions: Secondary | ICD-10-CM | POA: Diagnosis not present

## 2023-08-02 LAB — CULTURE, BLOOD (ROUTINE X 2)
Culture: NO GROWTH
Culture: NO GROWTH
Special Requests: ADEQUATE
Special Requests: ADEQUATE

## 2023-08-02 NOTE — Plan of Care (Signed)

## 2023-08-02 NOTE — Progress Notes (Signed)
 PROGRESS NOTE   HPI was taken from Dr. Rosalea Collin: John Spears is a 73 y.o. male with medical history significant of seizure, HTN, H LD, dCHF, stroke, A fib on Xarelto , BPH, who presents with tremor.   Per her sister (I called her sister by phone), pt has not been taking his Keppra  in the past two days due to dental procedure. Pt had 4 teeth pullout. His bottom gums are oozing blood. He still has oozing blood from dental surgery. Pt developed tremors this AM which often precede his seizure activity per her sister. Pt was seen in ED this AM, and had negative work up. Pt was discharged home and instructed to return to his normal Keppra  dosing. After went home, pt developed right hand shaking, right facial droop and became unresponsive per her sister. Pt was brought back to ED again.    When I saw patient in ED, patient is mildly confused.  He knows his own name, is orientated to place, but confused about year.  He moves all extremities, no facial droop or slurred speech on my examination.  Denies chest pain, cough, SOB.  No nausea, vomiting, diarrhea or abdominal pain.  No symptoms of UTI.  No fever or chills. Of  note, pt has Keppra  1500 mg twice daily and lacosamide  150 mg twice daily on his medication list, but per his sister, patient only takes Keppra  normally.   Addendum: after pt arrived to the floor, he started seizing. Pt was given 1500 mg of Keppra , 150 mg of Vimpt and total of 6 mg of Ativan  by IV, still seizing. Suspecting status epilepticus. I ordered another 1500 mg of Keppra  to be given. Consulted ICU NP, Ouma. Pt will be transferred to SDU.       Darcell Sabino  ZOX:[id number] DOB: [birth year] DOA: 07/23/2023 PCP: Patient, No Pcp Per   Assessment & Plan:   Principal Problem:   Seizure (HCC) Active Problems:   History of CVA (cerebrovascular accident)   Toxic metabolic encephalopathy   Paroxysmal atrial fibrillation (HCC)   Essential hypertension   HLD (hyperlipidemia)    Chronic diastolic CHF (congestive heart failure) (HCC)   BPH (benign prostatic hyperplasia)   Alcohol abuse   Seizures (HCC)  Assessment and Plan:  Bacteremia: blood cxs growing e.coli. Source unclear, possibly urine, culture pending. Ct abdomen/pelvis nothing acute no signs infection. Repeat BCs no growth thus far. Only hardware is right hip replacement.  Urine culture did not grow e coli (and insignificant growth). Source unclear. Bladder scan negative. Have transitioned to amoxicillin  1 g tid , plan for 10 days treatment (so amox 6/8-6/12)  Seizure: breakthrough. Secondary to noncompliance w/ meds, infection. Continue on vimpat , keppra .  Neuro recs appreciated  Fever: secondary to bacteremia. resolved  Hypokalemia: resolved   Hx of CVA: continue on statin, aspirin     PAF: continue on coreg . restarted xarelto , monitor for bleeding (recent dental procedure had bleeding from that) - none thus far   HTN: controlled continue on amlodipine , coreg    HLD: continue on statin     Chronic diastolic CHF: echo on 06/24/2022 showed EF 60 to 65% with grade 1 diastolic dysfunction.  Patient does not have leg edema or JVD.  Appears compensated. Holding lasix     BPH: continue on home dose of flomax . Bladder scan negative   Alcohol abuse: reports last drink >2 weeks ago so withdrawal unlikely  Normocytic anemia: H&H are labile but stable.    Debility: PT advising snf and family desires  that, TOC aware and working on it     DVT prophylaxis: xarelto  Code Status: full  Family Communication: sister Ninette Basque updated telephonically 6/8, no answer when telephoned today. Niece Lavera Postal updated telephonically 6/8. Disposition Plan: snf  Level of care: Progressive  Status is: Inpatient Remains inpatient appropriate because: pending snf placement     Consultants:  Neuro  ICU  Procedures:  Antimicrobials:  Subjective: No complaints,  no pain  Objective: Vitals:   08/02/23 0030 08/02/23 0533  08/02/23 0931 08/02/23 1159  BP: 122/63 (!) 147/71 (!) 141/69 122/67  Pulse: 60 (!) 55 63 (!) 58  Resp: 16 16    Temp: 98.2 F (36.8 C) 98.2 F (36.8 C) 98.8 F (37.1 C) 98 F (36.7 C)  TempSrc: Oral Oral Oral Oral  SpO2: 98% 100% 100% 100%  Weight:      Height:        Intake/Output Summary (Last 24 hours) at 08/02/2023 1623 Last data filed at 08/02/2023 1300 Gross per 24 hour  Intake 360 ml  Output 575 ml  Net -215 ml   Filed Weights   07/30/23 0500 07/31/23 0459 08/01/23 0500  Weight: 76.9 kg 72.4 kg 73.8 kg    Examination:  General exam: calm Respiratory system: diminished breath sounds b/l, normal wob, clear Cardiovascular system: S1/S2+. No rubs or gallops  Gastrointestinal system: abd is soft, NT, ND   Central nervous system:  oriented to self only. Moves all extremities  Psychiatry: Judgement and insight appears poor. Bit confused. calm    Data Reviewed: I have personally reviewed following labs and imaging studies  CBC: Recent Labs  Lab 07/27/23 0415 07/28/23 0458 07/29/23 0251 07/31/23 0420 08/01/23 0419  WBC 14.7* 15.7* 11.6* 6.2 7.0  HGB 8.5* 9.2* 8.3* 8.9* 8.5*  HCT 26.3* 28.5* 26.4* 27.4* 26.4*  MCV 89.2 88.5 89.2 87.0 86.0  PLT 147* 154 162 221 258   Basic Metabolic Panel: Recent Labs  Lab 07/27/23 0415 07/28/23 0458 07/29/23 0251 07/31/23 0420 08/01/23 0419  NA 139 139 140 141 138  K 3.8 3.4* 4.0 3.9 4.2  CL 106 106 109 109 108  CO2 24 25 23 24 24   GLUCOSE 116* 104* 102* 93 86  BUN 19 20 17 18 19   CREATININE 1.02 1.02 1.01 0.98 0.98  CALCIUM  8.8* 9.0 9.0 9.2 9.3   GFR: Estimated Creatinine Clearance: 72.2 mL/min (by C-G formula based on SCr of 0.98 mg/dL). Liver Function Tests: No results for input(s): "AST", "ALT", "ALKPHOS", "BILITOT", "PROT", "ALBUMIN" in the last 168 hours.  No results for input(s): "LIPASE", "AMYLASE" in the last 168 hours. No results for input(s): "AMMONIA" in the last 168 hours. Coagulation Profile: No  results for input(s): "INR", "PROTIME" in the last 168 hours.  Cardiac Enzymes: No results for input(s): "CKTOTAL", "CKMB", "CKMBINDEX", "TROPONINI" in the last 168 hours. BNP (last 3 results) No results for input(s): "PROBNP" in the last 8760 hours. HbA1C: No results for input(s): "HGBA1C" in the last 72 hours. CBG: No results for input(s): "GLUCAP" in the last 168 hours.  Lipid Profile: No results for input(s): "CHOL", "HDL", "LDLCALC", "TRIG", "CHOLHDL", "LDLDIRECT" in the last 72 hours. Thyroid Function Tests: No results for input(s): "TSH", "T4TOTAL", "FREET4", "T3FREE", "THYROIDAB" in the last 72 hours. Anemia Panel: No results for input(s): "VITAMINB12", "FOLATE", "FERRITIN", "TIBC", "IRON ", "RETICCTPCT" in the last 72 hours. Sepsis Labs: Recent Labs  Lab 07/27/23 0415  PROCALCITON 8.84    Recent Results (from the past 240 hours)  MRSA Next Gen  by PCR, Nasal     Status: None   Collection Time: 07/24/23  2:51 AM   Specimen: Nasal Mucosa; Nasal Swab  Result Value Ref Range Status   MRSA by PCR Next Gen NOT DETECTED NOT DETECTED Final    Comment: (NOTE) The GeneXpert MRSA Assay (FDA approved for NASAL specimens only), is one component of a comprehensive MRSA colonization surveillance program. It is not intended to diagnose MRSA infection nor to guide or monitor treatment for MRSA infections. Test performance is not FDA approved in patients less than 19 years old. Performed at [facility], [address] [place], [place] [address]   Culture, blood (Routine X 2) w Reflex to ID Panel     Status: Abnormal   Collection Time: 07/26/23 12:57 PM   Specimen: BLOOD  Result Value Ref Range Status   Specimen Description   Final    BLOOD BLOOD LEFT ARM Performed at [facility], [address], [place], [place] [address]    Special Requests   Final    BOTTLES DRAWN AEROBIC AND ANAEROBIC Blood Culture adequate volume Performed at [facility],  [address] [place], [place] [address]    Culture  Setup Time   Final    GRAM NEGATIVE RODS IN BOTH AEROBIC AND ANAEROBIC BOTTLES CRITICAL RESULT CALLED TO, READ BACK BY AND VERIFIED WITH:  JASON ROBINS AT 9147 07/27/23 JG GRAM STAIN REVIEWED-AGREE WITH RESULT DRT Performed at [facility], [address], [place], [place] [address]    Culture ESCHERICHIA COLI (A)  Final   Report Status 07/29/2023 FINAL  Final   Organism ID, Bacteria ESCHERICHIA COLI  Final   Organism ID, Bacteria ESCHERICHIA COLI  Final      Susceptibility   Escherichia coli - KIRBY BAUER*    CEFAZOLIN SENSITIVE Sensitive    Escherichia coli - MIC*    AMPICILLIN  4 SENSITIVE Sensitive     CEFEPIME <=0.12 SENSITIVE Sensitive     CEFTAZIDIME <=1 SENSITIVE Sensitive     CEFTRIAXONE  <=0.25 SENSITIVE Sensitive     CIPROFLOXACIN <=0.25 SENSITIVE Sensitive     GENTAMICIN <=1 SENSITIVE Sensitive     IMIPENEM <=0.25 SENSITIVE Sensitive     TRIMETH/SULFA <=20 SENSITIVE Sensitive     AMPICILLIN /SULBACTAM <=2 SENSITIVE Sensitive     PIP/TAZO <=4 SENSITIVE Sensitive ug/mL    * ESCHERICHIA COLI    ESCHERICHIA COLI  Blood Culture ID Panel (Reflexed)     Status: Abnormal   Collection Time: 07/26/23 12:57 PM  Result Value Ref Range Status   Enterococcus faecalis NOT DETECTED NOT DETECTED Final   Enterococcus Faecium NOT DETECTED NOT DETECTED Final   Listeria monocytogenes NOT DETECTED NOT DETECTED Final   Staphylococcus species NOT DETECTED NOT DETECTED Final   Staphylococcus aureus (BCID) NOT DETECTED NOT DETECTED Final   Staphylococcus epidermidis NOT DETECTED NOT DETECTED Final   Staphylococcus lugdunensis NOT DETECTED NOT DETECTED Final   Streptococcus species NOT DETECTED NOT DETECTED Final   Streptococcus agalactiae NOT DETECTED NOT DETECTED Final   Streptococcus pneumoniae NOT DETECTED NOT DETECTED Final   Streptococcus pyogenes NOT DETECTED NOT DETECTED Final   A.calcoaceticus-baumannii NOT DETECTED NOT  DETECTED Final   Bacteroides fragilis NOT DETECTED NOT DETECTED Final   Enterobacterales DETECTED (A) NOT DETECTED Final    Comment: Enterobacterales represent a large order of gram negative bacteria, not a single organism. CRITICAL RESULT CALLED TO, READ BACK BY AND VERIFIED WITH:  JASON ROBINS AT 0123 07/27/23 JG  Enterobacter cloacae complex NOT DETECTED NOT DETECTED Final   Escherichia coli DETECTED (A) NOT DETECTED Final    Comment: CRITICAL RESULT CALLED TO, READ BACK BY AND VERIFIED WITH:  JASON ROBINS AT 0123 07/27/23 JG    Klebsiella aerogenes NOT DETECTED NOT DETECTED Final   Klebsiella oxytoca NOT DETECTED NOT DETECTED Final   Klebsiella pneumoniae NOT DETECTED NOT DETECTED Final   Proteus species NOT DETECTED NOT DETECTED Final   Salmonella species NOT DETECTED NOT DETECTED Final   Serratia marcescens NOT DETECTED NOT DETECTED Final   Haemophilus influenzae NOT DETECTED NOT DETECTED Final   Neisseria meningitidis NOT DETECTED NOT DETECTED Final   Pseudomonas aeruginosa NOT DETECTED NOT DETECTED Final   Stenotrophomonas maltophilia NOT DETECTED NOT DETECTED Final   Candida albicans NOT DETECTED NOT DETECTED Final   Candida auris NOT DETECTED NOT DETECTED Final   Candida glabrata NOT DETECTED NOT DETECTED Final   Candida krusei NOT DETECTED NOT DETECTED Final   Candida parapsilosis NOT DETECTED NOT DETECTED Final   Candida tropicalis NOT DETECTED NOT DETECTED Final   Cryptococcus neoformans/gattii NOT DETECTED NOT DETECTED Final   CTX-M ESBL NOT DETECTED NOT DETECTED Final   Carbapenem resistance IMP NOT DETECTED NOT DETECTED Final   Carbapenem resistance KPC NOT DETECTED NOT DETECTED Final   Carbapenem resistance NDM NOT DETECTED NOT DETECTED Final   Carbapenem resist OXA 48 LIKE NOT DETECTED NOT DETECTED Final   Carbapenem resistance VIM NOT DETECTED NOT DETECTED Final    Comment: Performed at [facility], [address] [place], [place] [address]  Culture,  blood (Routine X 2) w Reflex to ID Panel     Status: Abnormal   Collection Time: 07/26/23  1:03 PM   Specimen: BLOOD  Result Value Ref Range Status   Specimen Description BLOOD BLOOD RIGHT ARM  Final   Special Requests   Final    BOTTLES DRAWN AEROBIC AND ANAEROBIC Blood Culture adequate volume   Culture  Setup Time   Final    GRAM NEGATIVE RODS AEROBIC BOTTLE ONLY CRITICAL VALUE NOTED.  VALUE IS CONSISTENT WITH PREVIOUSLY REPORTED AND CALLED VALUE. GRAM STAIN REVIEWED-AGREE WITH RESULT DRT    Culture (A)  Final    ESCHERICHIA COLI SUSCEPTIBILITIES PERFORMED ON PREVIOUS CULTURE WITHIN THE LAST 5 DAYS.    Report Status 07/29/2023 FINAL  Final  Urine Culture (for pregnant, neutropenic or urologic patients or patients with an indwelling urinary catheter)     Status: Abnormal   Collection Time: 07/27/23  8:49 AM   Specimen: Urine, Clean Catch  Result Value Ref Range Status   Specimen Description   Final    URINE, CLEAN CATCH Performed at [facility], [address] [place], [place] [address]    Special Requests   Final    NONE Performed at [facility], [address], [place], [place] [address]    Culture (A)  Final    5,000 COLONIES/mL STAPHYLOCOCCUS EPIDERMIDIS 1,000 COLONIES/mL ENTEROCOCCUS FAECALIS CALL MICROBIOLOGY LAB IF SENSITIVITIES ARE REQUIRED. Performed at [facility], [address], [place], [place] [address]    Report Status 07/30/2023 FINAL  Final  Culture, blood (Routine X 2) w Reflex to ID Panel     Status: None   Collection Time: 07/28/23  8:23 AM   Specimen: BLOOD  Result Value Ref Range Status   Specimen Description BLOOD LEFT ANTECUBITAL  Final   Special Requests   Final    BOTTLES DRAWN AEROBIC AND ANAEROBIC Blood  Culture adequate volume   Culture   Final    NO GROWTH 5 DAYS Performed at [facility], [address] [place], [place] [address]    Report Status 08/02/2023 FINAL  Final  Culture, blood (Routine X  2) w Reflex to ID Panel     Status: None   Collection Time: 07/28/23  8:31 AM   Specimen: BLOOD  Result Value Ref Range Status   Specimen Description BLOOD RIGHT ANTECUBITAL  Final   Special Requests   Final    BOTTLES DRAWN AEROBIC AND ANAEROBIC Blood Culture adequate volume   Culture   Final    NO GROWTH 5 DAYS Performed at [facility], [address], [place], [place] [address]    Report Status 08/02/2023 FINAL  Final         Radiology Studies: No results found.        Scheduled Meds:  amLODipine   10 mg Oral QHS   amoxicillin   1,000 mg Oral Q8H   aspirin  EC  81 mg Oral Daily   atorvastatin   40 mg Oral QHS   carvedilol   12.5 mg Oral BID WC   cyanocobalamin   1,000 mcg Oral Daily   folic acid   1 mg Oral Daily   gabapentin   100 mg Oral BID   lacosamide   150 mg Oral BID   levETIRAcetam   1,500 mg Oral BID   multivitamin with minerals  1 tablet Oral Daily   polyethylene glycol  17 g Oral Daily   rivaroxaban   20 mg Oral Q supper   tamsulosin   0.4 mg Oral QHS   thiamine   100 mg Oral Daily   Or   thiamine   100 mg Intravenous Daily   Continuous Infusions:      LOS: 9 days     Raymonde Calico, MD [facility]  If 7PM-7AM, please contact night-coverage [email or web address] 08/02/2023, 4:23 PM

## 2023-08-02 NOTE — TOC Progression Note (Addendum)
 Transition of Care [facility]) - Progression Note    Patient Details  Name: Caylor Tallarico MRN: [id number] Date of Birth: 1950-05-13  Transition of Care [facility]) CM/SW Contact  Baird Bombard, RN Phone Number: 08/02/2023, 10:58 AM  Clinical Narrative:    Attempt to reach patient's son regarding no answer.  11:00pm Retrieved call back from patient's son, Jowan. RNCM confirmed patient's bed offer from [facility] is being requested. Keante confirmed he would like [facility].   11:34 Spoke with Gena from [facility]. Facility will start auth today.      Barriers to Discharge: Continued Medical Work up  Expected Discharge Plan and Services                                               Social Determinants of Health (SDOH) Interventions SDOH Screenings   Food Insecurity: Patient Unable To Answer (07/26/2023)  Housing: Low Risk  (04/06/2023)   Received from [facility]  Transportation Needs: Unmet Transportation Needs (04/06/2023)   Received from [facility]  Utilities: Not At Risk (04/06/2023)   Received from [facility]  Financial Resource Strain: Medium Risk (04/06/2023)   Received [facility]  Social Connections: Unknown (03/12/2023)  Tobacco Use: Medium Risk (07/23/2023)    Readmission Risk Interventions     No data to display

## 2023-08-02 NOTE — Progress Notes (Signed)
 Physical Therapy Treatment Patient Details Name: John Spears MRN: [id number] DOB: October 31, 1950 Today's Date: 08/02/2023   History of Present Illness John Spears is a 73 y.o. male with medical history significant of seizure, HTN, H LD, dCHF, stroke, A fib on Xarelto , BPH, who presents with tremor, Pt was seen in ED this AM, and had negative work up. Pt was discharged home and instructed to return to his normal Keppra  dosing. After went home, pt developed right hand shaking, right facial droop and became unresponsive per her sister. Pt was brought back to ED again. 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  2. Stable remote left parietal cortical infarct.  3. Stable mild periventricular white matter changes likely  reflecting the sequela of small vessel ischemia.    PT Comments  PT/OT co-treat performed this date. Pt is making good progress towards goals with ability to ambulate in hallway using RW. Improved safety awareness with use of RW, however still needs cues for sequencing. As pt fatigues with increased distance, gait quality decreases. Still remain tremulous with gait with B LE>B UE. Once seated in recliner, able to perform B LE strengthening there-ex. Will continue to progress as able.   If plan is discharge home, recommend the following: Assistance with cooking/housework;Assist for transportation;Help with stairs or ramp for entrance;A lot of help with walking and/or transfers;A little help with bathing/dressing/bathroom;Direct supervision/assist for medications management   Can travel by private vehicle     Yes  Equipment Recommendations   (TBD)    Recommendations for Other Services       Precautions / Restrictions Precautions Precautions: Fall Recall of Precautions/Restrictions:  Impaired Restrictions Weight Bearing Restrictions Per Provider Order: No     Mobility  Bed Mobility Overal bed mobility: Needs Assistance Bed Mobility: Supine to Sit     Supine to sit: Supervision, HOB elevated     General bed mobility comments: safe technique with ease of transition. Once seated at EOB, upright posture noted    Transfers Overall transfer level: Needs assistance Equipment used: Rolling walker (2 wheels) Transfers: Sit to/from Stand Sit to Stand: Contact guard assist           General transfer comment: cues for hand placement prior to transfer. Once standing, upright posture noted. RW used for safety    Ambulation/Gait Ambulation/Gait assistance: Min Chemical engineer (Feet): 120 Feet Assistive device: Rolling walker (2 wheels) Gait Pattern/deviations: Narrow base of support, Trunk flexed, Step-through pattern       General Gait Details: ambulated in hallway. Has difficulty with turns. Slow gait speed and cues for safe distance from RW. Close chair follow for safety. Still remains tremeulous in B LE   Stairs             Wheelchair Mobility     Tilt Bed    Modified Rankin (Stroke Patients Only)       Balance Overall balance assessment: Needs assistance Sitting-balance support: Feet supported Sitting balance-Leahy Scale: Fair     Standing balance support: Reliant on assistive device for balance, Bilateral upper extremity supported Standing balance-Leahy Scale: Poor                              Communication Communication Communication: Impaired Factors Affecting Communication: Difficulty expressing self;Reduced clarity of  speech  Cognition Arousal: Alert Behavior During Therapy: Flat affect, Impulsive   PT - Cognitive impairments: No family/caregiver present to determine baseline                       PT - Cognition Comments: alert and oriented, however confused on higher level questioning. With  fatigue, cognition worsens Following commands: Impaired      Cueing Cueing Techniques: Tactile cues, Gestural cues, Verbal cues  Exercises Other Exercises Other Exercises: sit<>Stand x 5 reps with cues for sequencing. Performed with min assist from recliner. Need for B hand support. Also performed LAQ and hip add squeezes x 10 reps with cga. Other Exercises: Assisted with standing tolerance for ADLs at sink with OT    General Comments General comments (skin integrity, edema, etc.): more cueing needed with fatigue as cognition waxes and wanes      Pertinent Vitals/Pain Pain Assessment Pain Assessment: No/denies pain    Home Living                          Prior Function            PT Goals (current goals can now be found in the care plan section) Acute Rehab PT Goals Patient Stated Goal: to go home PT Goal Formulation: With patient Time For Goal Achievement: 08/08/23 Potential to Achieve Goals: Fair Progress towards PT goals: Progressing toward goals    Frequency    Min 2X/week      PT Plan      Co-evaluation PT/OT/SLP Co-Evaluation/Treatment: Yes Reason for Co-Treatment: For patient/therapist safety;Necessary to address cognition/behavior during functional activity PT goals addressed during session: Mobility/safety with mobility OT goals addressed during session: ADL's and self-care      AM-PAC PT "6 Clicks" Mobility   Outcome Measure  Help needed turning from your back to your side while in a flat bed without using bedrails?: A Little Help needed moving from lying on your back to sitting on the side of a flat bed without using bedrails?: A Little Help needed moving to and from a bed to a chair (including a wheelchair)?: A Little Help needed standing up from a chair using your arms (e.g., wheelchair or bedside chair)?: A Little Help needed to walk in hospital room?: A Little Help needed climbing 3-5 steps with a railing? : A Lot 6 Click Score:  17    End of Session Equipment Utilized During Treatment: Gait belt Activity Tolerance: Patient tolerated treatment well Patient left: in chair;with chair alarm set Nurse Communication: Mobility status PT Visit Diagnosis: Other abnormalities of gait and mobility (R26.89);Difficulty in walking, not elsewhere classified (R26.2);Muscle weakness (generalized) (M62.81)     Time: 2130-8657 PT Time Calculation (min) (ACUTE ONLY): 29 min  Charges:    $Gait Training: 8-22 mins PT General Charges $$ ACUTE PT VISIT: 1 Visit                     Amparo Balk, PT, DPT, GCS [phone]    Devery Murgia 08/02/2023, 3:37 PM

## 2023-08-02 NOTE — Progress Notes (Signed)
 Occupational Therapy Treatment Patient Details Name: John Spears MRN: [id number] DOB: 1950-07-08 Today's Date: 08/02/2023   History of present illness John Spears is a 73 y.o. male with medical history significant of seizure, HTN, H LD, dCHF, stroke, A fib on Xarelto , BPH, who presents with tremor, Pt was seen in ED this AM, and had negative work up. Pt was discharged home and instructed to return to his normal Keppra  dosing. After went home, pt developed right hand shaking, right facial droop and became unresponsive per her sister. Pt was brought back to ED again. 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  2. Stable remote left parietal cortical infarct.  3. Stable mild periventricular white matter changes likely  reflecting the sequela of small vessel ischemia.   OT comments  Pt is supine in bed on arrival. Pleasant and agreeable to OT session. He denies pain. Pt performed bed mobility with supervision. Cueing for safety as he impulsively tries to stand without assist. Pt required Min/CGA for STS from EOB to RW d/t initial imbalance and tremors. Pt seemingly improved with gait, able to ambulated out into hallway with Min to CGA x1 for cueing with RW use/safety and +1 for chair follow. Pt took seated rest break in recliner, changed gowns with Min A for UB dressing, then ambulated to the sink to perform standing grooming tasks including mouthwash, face wash and combing his hair with intermittent unilateral support on RW. Pt needs verb cues at times to perform task appropriately, e.g. he tried to pour mouthwash on his head and face, needed cueing to extend neck to be able to take a sip and cues to spit it out, etc. Pt returned to recliner to continue working with PT. Less cueing noted this date, however with  fatigue more notable cognitive deficits. He will cont to require skilled acute OT services to maximize his safety and IND to return to PLOF.       If plan is discharge home, recommend the following:  A little help with walking and/or transfers;A little help with bathing/dressing/bathroom;Assistance with cooking/housework;Assist for transportation;Help with stairs or ramp for entrance;Direct supervision/assist for medications management;Direct supervision/assist for financial management;Supervision due to cognitive status   Equipment Recommendations  BSC/3in1    Recommendations for Other Services      Precautions / Restrictions Precautions Precautions: Fall Recall of Precautions/Restrictions: Impaired Restrictions Weight Bearing Restrictions Per Provider Order: No       Mobility Bed Mobility Overal bed mobility: Needs Assistance Bed Mobility: Supine to Sit     Supine to sit: Supervision, HOB elevated     General bed mobility comments: no assist, supervision for safety/impulsivity    Transfers Overall transfer level: Needs assistance Equipment used: Rolling walker (2 wheels) Transfers: Sit to/from Stand Sit to Stand: Contact guard assist           General transfer comment: CGA for safety with STS d/t tremulous and unsteadiness; ambulated into hallway with RW and min to CGA for safety, cueing, etc. with +1 for chair follow     Balance Overall balance assessment: Needs assistance Sitting-balance support: Feet supported Sitting balance-Leahy Scale: Fair Sitting balance - Comments: SBA static seated balance   Standing balance support: Reliant on assistive device for balance, Bilateral upper extremity supported Standing balance-Leahy Scale: Poor Standing balance comment: tremulous with fatigue, frequent verb  cues for safety and use of RW                           ADL either performed or assessed with clinical judgement   ADL Overall ADL's : Needs  assistance/impaired     Grooming: Wash/dry face;Oral care;Cueing for sequencing;Contact guard assist Grooming Details (indicate cue type and reason): cueing at times d/t trying to pour mouthwash in hair versus place in mouth, combs the same place in his hair over and over, etc         Upper Body Dressing : Minimal assistance;Sitting Upper Body Dressing Details (indicate cue type and reason): in recliner doff/don gown                 Functional mobility during ADLs: Minimal assistance;Rolling walker (2 wheels);Contact guard assist      Extremity/Trunk Assessment              Vision       Perception     Praxis     Communication Communication Communication: Impaired Factors Affecting Communication: Difficulty expressing self;Reduced clarity of speech   Cognition Arousal: Alert Behavior During Therapy: Flat affect, Impulsive               OT - Cognition Comments: cueing for safety, impulsive with standing                 Following commands: Impaired Following commands impaired: Follows one step commands inconsistently      Cueing   Cueing Techniques: Tactile cues, Gestural cues, Verbal cues  Exercises      Shoulder Instructions       General Comments more cueing needed with fatigue as cognition waxes and wanes    Pertinent Vitals/ Pain       Pain Assessment Pain Assessment: No/denies pain  Home Living                                          Prior Functioning/Environment              Frequency  Min 2X/week        Progress Toward Goals  OT Goals(current goals can now be found in the care plan section)  Progress towards OT goals: Progressing toward goals  Acute Rehab OT Goals Patient Stated Goal: get better OT Goal Formulation: With patient Time For Goal Achievement: 08/09/23 Potential to Achieve Goals: Good  Plan      Co-evaluation    PT/OT/SLP Co-Evaluation/Treatment: Yes Reason for  Co-Treatment: For patient/therapist safety;Necessary to address cognition/behavior during functional activity PT goals addressed during session: Mobility/safety with mobility OT goals addressed during session: ADL's and self-care      AM-PAC OT "6 Clicks" Daily Activity     Outcome Measure   Help from another person eating meals?: None Help from another person taking care of personal grooming?: A Little Help from another person toileting, which includes using toliet, bedpan, or urinal?: A Little Help from another person bathing (including washing, rinsing, drying)?: A Lot Help from another person to put on and taking off regular upper body clothing?: A Little Help from another person to put on and taking off regular lower body clothing?: A Lot 6 Click Score: 17    End of Session Equipment Utilized During Treatment: Rolling walker (2 wheels);Gait belt  OT Visit Diagnosis:  Other abnormalities of gait and mobility (R26.89);Muscle weakness (generalized) (M62.81)   Activity Tolerance Patient tolerated treatment well   Patient Left with call bell/phone within reach;in chair;with chair alarm set (PT still in room)   Nurse Communication Mobility status        Time: 2952-8413 OT Time Calculation (min): 21 min  Charges: OT General Charges $OT Visit: 1 Visit OT Treatments $Self Care/Home Management : 8-22 mins  Ivyonna Hoelzel, OTR/L  08/02/23, 1:30 PM   Reyce Lubeck E Christiane Sistare 08/02/2023, 1:25 PM

## 2023-08-03 DIAGNOSIS — R569 Unspecified convulsions: Secondary | ICD-10-CM | POA: Diagnosis not present

## 2023-08-03 NOTE — TOC Progression Note (Addendum)
 Transition of Care [facility]) - Progression Note    Patient Details  Name: John Spears MRN: [id number] Date of Birth: [birth year]  [facility] CM/SW Contact  Crayton Docker, RN 08/03/2023, 12:03 PM  Clinical Narrative:     CM follow up call to Tammy, Admissions, [facility] regarding holding bed for patient. Per Tammy, SNF will hold bed. CM alert to Dr. Sari Cunning regarding medically readiness for discharge.  Per Dr. Sari Cunning, patient medically ready. CM follow up call to Gena, Admissions, [facility] regarding patient medically ready and alert to CMA regarding initiating auth for SNF. CM alert to [employer] and Leoma Raja, CMA regarding initiating auth for [employer]. CM    Alert received from Leoma Raja, [place], auth for SNF placement is  pending auth ID #[id number]. CM call placed to Gena, Admission, [facility] . Per Gena, also forward updated clinicals for review.    Barriers to Discharge: Continued Medical Work up  Expected Discharge Plan and Services    SNF     Social Determinants of Health (SDOH) Interventions SDOH Screenings   Food Insecurity: Patient Unable To Answer (07/26/2023)  Housing: Low Risk  (04/06/2023)   Received from [facility]  Transportation Needs: Unmet Transportation Needs (04/06/2023)   Received from [facility]  Utilities: Not At Risk (04/06/2023)   Received from [facility]  Financial Resource Strain: Medium Risk (04/06/2023)   Received from [facility]  Social Connections: Unknown (03/12/2023)  Tobacco Use: Medium Risk (07/23/2023)    Readmission Risk Interventions     No data to display

## 2023-08-03 NOTE — Progress Notes (Signed)
 PROGRESS NOTE   HPI was taken from Dr. Rosalea Collin: John Spears is a 73 y.o. male with medical history significant of seizure, HTN, H LD, dCHF, stroke, A fib on Xarelto , BPH, who presents with tremor.   Per her sister (I called her sister by phone), pt has not been taking his Keppra  in the past two days due to dental procedure. Pt had 4 teeth pullout. His bottom gums are oozing blood. He still has oozing blood from dental surgery. Pt developed tremors this AM which often precede his seizure activity per her sister. Pt was seen in ED this AM, and had negative work up. Pt was discharged home and instructed to return to his normal Keppra  dosing. After went home, pt developed right hand shaking, right facial droop and became unresponsive per her sister. Pt was brought back to ED again.    When I saw patient in ED, patient is mildly confused.  He knows his own name, is orientated to place, but confused about year.  He moves all extremities, no facial droop or slurred speech on my examination.  Denies chest pain, cough, SOB.  No nausea, vomiting, diarrhea or abdominal pain.  No symptoms of UTI.  No fever or chills. Of  note, pt has Keppra  1500 mg twice daily and lacosamide  150 mg twice daily on his medication list, but per his sister, patient only takes Keppra  normally.   Addendum: after pt arrived to the floor, he started seizing. Pt was given 1500 mg of Keppra , 150 mg of Vimpt and total of 6 mg of Ativan  by IV, still seizing. Suspecting status epilepticus. I ordered another 1500 mg of Keppra  to be given. Consulted ICU NP, [place]. Pt will be transferred to SDU.       John Spears  JOA:[id number] DOB: November 26, 1950 DOA: 07/23/2023 PCP: Patient, No Pcp Per   Assessment & Plan:   Principal Problem:   Seizure (HCC) Active Problems:   History of CVA (cerebrovascular accident)   Toxic metabolic encephalopathy   Paroxysmal atrial fibrillation (HCC)   Essential hypertension   HLD (hyperlipidemia)    Chronic diastolic CHF (congestive heart failure) (HCC)   BPH (benign prostatic hyperplasia)   Alcohol abuse   Seizures (HCC)  Assessment and Plan:  Bacteremia: blood cxs growing e.coli. Source unclear, possibly urine, culture pending. Ct abdomen/pelvis nothing acute no signs infection. Repeat BCs no growth thus far. Only hardware is right hip replacement.  Urine culture did not grow e coli (and insignificant growth). Source unclear. Bladder scan negative. Have transitioned to amoxicillin  1 g tid , plan for 10 days treatment (so amox 6/8-6/12). Reviewed above course and treatment plan w/ Dr. Harwood Lingo of ID who is in agreement with this w/u and plan.  Seizure: breakthrough. Secondary to noncompliance w/ meds, infection. Continue on vimpat , keppra .  Neuro recs appreciated  Fever: secondary to bacteremia. resolved  Hypokalemia: resolved   Hx of CVA: continue on statin, aspirin     PAF: continue on coreg . restarted xarelto , monitor for bleeding (recent dental procedure had bleeding from that) - none thus far   HTN: controlled continue on amlodipine , coreg    HLD: continue on statin     Chronic diastolic CHF: echo on 06/24/2022 showed EF 60 to 65% with grade 1 diastolic dysfunction.  Patient does not have leg edema or JVD.  Appears compensated. Holding lasix     BPH: continue on home dose of flomax . Bladder scan negative   Alcohol abuse: reports last drink >2 weeks ago so  withdrawal unlikely  Normocytic anemia: H&H are labile but stable.    Debility: PT advising snf and family desires that, has bed, awaiting insurance auth     DVT prophylaxis: xarelto  Code Status: full  Family Communication: Niece Taran updated telephonically 6/10 Disposition Plan: snf  Level of care: Med-Surg  Status is: Inpatient Remains inpatient appropriate because: pending snf placement     Consultants:  Neuro  ICU  Procedures:  Antimicrobials:  Subjective: No complaints,  no pain, bm today,  tolerating diet  Objective: Vitals:   08/02/23 1714 08/02/23 1923 08/03/23 0418 08/03/23 0746  BP: (!) 152/75 (!) 155/70 (!) 145/78 131/65  Pulse: 62 65 60 63  Resp:   16 18  Temp: 98.8 F (37.1 C) 98.2 F (36.8 C) 97.7 F (36.5 C) 98.1 F (36.7 C)  TempSrc: Oral Oral    SpO2: 100% 100% 100% 100%  Weight:  77.6 kg    Height:        Intake/Output Summary (Last 24 hours) at 08/03/2023 1623 Last data filed at 08/03/2023 1234 Gross per 24 hour  Intake 240 ml  Output 450 ml  Net -210 ml   Filed Weights   07/31/23 0459 08/01/23 0500 08/02/23 1923  Weight: 72.4 kg 73.8 kg 77.6 kg    Examination:  General exam: calm Respiratory system: diminished breath sounds b/l, normal wob, clear Cardiovascular system: S1/S2+. No rubs or gallops  Gastrointestinal system: abd is soft, NT, ND   Central nervous system:  oriented to self only. Moves all extremities  Psychiatry: Judgement and insight appears poor. Bit confused. calm    Data Reviewed: I have personally reviewed following labs and imaging studies  CBC: Recent Labs  Lab 07/28/23 0458 07/29/23 0251 07/31/23 0420 08/01/23 0419  WBC 15.7* 11.6* 6.2 7.0  HGB 9.2* 8.3* 8.9* 8.5*  HCT 28.5* 26.4* 27.4* 26.4*  MCV 88.5 89.2 87.0 86.0  PLT 154 162 221 258   Basic Metabolic Panel: Recent Labs  Lab 07/28/23 0458 07/29/23 0251 07/31/23 0420 08/01/23 0419  NA 139 140 141 138  K 3.4* 4.0 3.9 4.2  CL 106 109 109 108  CO2 25 23 24 24   GLUCOSE 104* 102* 93 86  BUN 20 17 18 19   CREATININE 1.02 1.01 0.98 0.98  CALCIUM  9.0 9.0 9.2 9.3   GFR: Estimated Creatinine Clearance: 73.6 mL/min (by C-G formula based on SCr of 0.98 mg/dL). Liver Function Tests: No results for input(s): "AST", "ALT", "ALKPHOS", "BILITOT", "PROT", "ALBUMIN" in the last 168 hours.  No results for input(s): "LIPASE", "AMYLASE" in the last 168 hours. No results for input(s): "AMMONIA" in the last 168 hours. Coagulation Profile: No results for input(s):  "INR", "PROTIME" in the last 168 hours.  Cardiac Enzymes: No results for input(s): "CKTOTAL", "CKMB", "CKMBINDEX", "TROPONINI" in the last 168 hours. BNP (last 3 results) No results for input(s): "PROBNP" in the last 8760 hours. HbA1C: No results for input(s): "HGBA1C" in the last 72 hours. CBG: No results for input(s): "GLUCAP" in the last 168 hours.  Lipid Profile: No results for input(s): "CHOL", "HDL", "LDLCALC", "TRIG", "CHOLHDL", "LDLDIRECT" in the last 72 hours. Thyroid Function Tests: No results for input(s): "TSH", "T4TOTAL", "FREET4", "T3FREE", "THYROIDAB" in the last 72 hours. Anemia Panel: No results for input(s): "VITAMINB12", "FOLATE", "FERRITIN", "TIBC", "IRON ", "RETICCTPCT" in the last 72 hours. Sepsis Labs: No results for input(s): "PROCALCITON", "LATICACIDVEN" in the last 168 hours.   Recent Results (from the past 240 hours)  Culture, blood (Routine X 2) w  Reflex to ID Panel     Status: Abnormal   Collection Time: 07/26/23 12:57 PM   Specimen: BLOOD  Result Value Ref Range Status   Specimen Description   Final    BLOOD BLOOD LEFT ARM Performed at [facility], [address], [place], [place] [address]    Special Requests   Final    BOTTLES DRAWN AEROBIC AND ANAEROBIC Blood Culture adequate volume Performed at [facility], [address] [place], [place] [address]    Culture  Setup Time   Final    GRAM NEGATIVE RODS IN BOTH AEROBIC AND ANAEROBIC BOTTLES CRITICAL RESULT CALLED TO, READ BACK BY AND VERIFIED WITH:  JASON ROBINS AT 0981 07/27/23 JG GRAM STAIN REVIEWED-AGREE WITH RESULT DRT Performed at [facility], [address] [place], [place] [address]    Culture ESCHERICHIA COLI (A)  Final   Report Status 07/29/2023 FINAL  Final   Organism ID, Bacteria ESCHERICHIA COLI  Final   Organism ID, Bacteria ESCHERICHIA COLI  Final      Susceptibility   Escherichia coli - KIRBY BAUER*    CEFAZOLIN SENSITIVE Sensitive     Escherichia coli - MIC*    AMPICILLIN  4 SENSITIVE Sensitive     CEFEPIME <=0.12 SENSITIVE Sensitive     CEFTAZIDIME <=1 SENSITIVE Sensitive     CEFTRIAXONE  <=0.25 SENSITIVE Sensitive     CIPROFLOXACIN <=0.25 SENSITIVE Sensitive     GENTAMICIN <=1 SENSITIVE Sensitive     IMIPENEM <=0.25 SENSITIVE Sensitive     TRIMETH/SULFA <=20 SENSITIVE Sensitive     AMPICILLIN /SULBACTAM <=2 SENSITIVE Sensitive     PIP/TAZO <=4 SENSITIVE Sensitive ug/mL    * ESCHERICHIA COLI    ESCHERICHIA COLI  Blood Culture ID Panel (Reflexed)     Status: Abnormal   Collection Time: 07/26/23 12:57 PM  Result Value Ref Range Status   Enterococcus faecalis NOT DETECTED NOT DETECTED Final   Enterococcus Faecium NOT DETECTED NOT DETECTED Final   Listeria monocytogenes NOT DETECTED NOT DETECTED Final   Staphylococcus species NOT DETECTED NOT DETECTED Final   Staphylococcus aureus (BCID) NOT DETECTED NOT DETECTED Final   Staphylococcus epidermidis NOT DETECTED NOT DETECTED Final   Staphylococcus lugdunensis NOT DETECTED NOT DETECTED Final   Streptococcus species NOT DETECTED NOT DETECTED Final   Streptococcus agalactiae NOT DETECTED NOT DETECTED Final   Streptococcus pneumoniae NOT DETECTED NOT DETECTED Final   Streptococcus pyogenes NOT DETECTED NOT DETECTED Final   A.calcoaceticus-baumannii NOT DETECTED NOT DETECTED Final   Bacteroides fragilis NOT DETECTED NOT DETECTED Final   Enterobacterales DETECTED (A) NOT DETECTED Final    Comment: Enterobacterales represent a large order of gram negative bacteria, not a single organism. CRITICAL RESULT CALLED TO, READ BACK BY AND VERIFIED WITH:  JASON ROBINS AT 0123 07/27/23 JG    Enterobacter cloacae complex NOT DETECTED NOT DETECTED Final   Escherichia coli DETECTED (A) NOT DETECTED Final    Comment: CRITICAL RESULT CALLED TO, READ BACK BY AND VERIFIED WITH:  JASON ROBINS AT 0123 07/27/23 JG    Klebsiella aerogenes NOT DETECTED NOT DETECTED Final   Klebsiella oxytoca NOT  DETECTED NOT DETECTED Final   Klebsiella pneumoniae NOT DETECTED NOT DETECTED Final   Proteus species NOT DETECTED NOT DETECTED Final   Salmonella species NOT DETECTED NOT DETECTED Final   Serratia marcescens NOT DETECTED NOT DETECTED Final   Haemophilus influenzae NOT DETECTED NOT DETECTED Final   Neisseria meningitidis NOT DETECTED NOT DETECTED Final   Pseudomonas aeruginosa NOT  DETECTED NOT DETECTED Final   Stenotrophomonas maltophilia NOT DETECTED NOT DETECTED Final   Candida albicans NOT DETECTED NOT DETECTED Final   Candida auris NOT DETECTED NOT DETECTED Final   Candida glabrata NOT DETECTED NOT DETECTED Final   Candida krusei NOT DETECTED NOT DETECTED Final   Candida parapsilosis NOT DETECTED NOT DETECTED Final   Candida tropicalis NOT DETECTED NOT DETECTED Final   Cryptococcus neoformans/gattii NOT DETECTED NOT DETECTED Final   CTX-M ESBL NOT DETECTED NOT DETECTED Final   Carbapenem resistance IMP NOT DETECTED NOT DETECTED Final   Carbapenem resistance KPC NOT DETECTED NOT DETECTED Final   Carbapenem resistance NDM NOT DETECTED NOT DETECTED Final   Carbapenem resist OXA 48 LIKE NOT DETECTED NOT DETECTED Final   Carbapenem resistance VIM NOT DETECTED NOT DETECTED Final    Comment: Performed at [facility], [address] [place], [place] [address]  Culture, blood (Routine X 2) w Reflex to ID Panel     Status: Abnormal   Collection Time: 07/26/23  1:03 PM   Specimen: BLOOD  Result Value Ref Range Status   Specimen Description BLOOD BLOOD RIGHT ARM  Final   Special Requests   Final    BOTTLES DRAWN AEROBIC AND ANAEROBIC Blood Culture adequate volume   Culture  Setup Time   Final    GRAM NEGATIVE RODS AEROBIC BOTTLE ONLY CRITICAL VALUE NOTED.  VALUE IS CONSISTENT WITH PREVIOUSLY REPORTED AND CALLED VALUE. GRAM STAIN REVIEWED-AGREE WITH RESULT DRT    Culture (A)  Final    ESCHERICHIA COLI SUSCEPTIBILITIES PERFORMED ON PREVIOUS CULTURE WITHIN THE LAST 5 DAYS.     Report Status 07/29/2023 FINAL  Final  Urine Culture (for pregnant, neutropenic or urologic patients or patients with an indwelling urinary catheter)     Status: Abnormal   Collection Time: 07/27/23  8:49 AM   Specimen: Urine, Clean Catch  Result Value Ref Range Status   Specimen Description   Final    URINE, CLEAN CATCH Performed at [facility], [address], [place], [place] [address]    Special Requests   Final    NONE Performed at [facility], [address] [place], [place] [address]    Culture (A)  Final    5,000 COLONIES/mL STAPHYLOCOCCUS EPIDERMIDIS 1,000 COLONIES/mL ENTEROCOCCUS FAECALIS CALL MICROBIOLOGY LAB IF SENSITIVITIES ARE REQUIRED. Performed at [facility], [address] [place], [place] [address]    Report Status 07/30/2023 FINAL  Final  Culture, blood (Routine X 2) w Reflex to ID Panel     Status: None   Collection Time: 07/28/23  8:23 AM   Specimen: BLOOD  Result Value Ref Range Status   Specimen Description BLOOD LEFT ANTECUBITAL  Final   Special Requests   Final    BOTTLES DRAWN AEROBIC AND ANAEROBIC Blood Culture adequate volume   Culture   Final    NO GROWTH 5 DAYS Performed at [facility], [address] [place], [place] [address]    Report Status 08/02/2023 FINAL  Final  Culture, blood (Routine X 2) w Reflex to ID Panel     Status: None   Collection Time: 07/28/23  8:31 AM   Specimen: BLOOD  Result Value Ref Range Status   Specimen Description BLOOD RIGHT ANTECUBITAL  Final   Special Requests   Final    BOTTLES DRAWN AEROBIC AND ANAEROBIC Blood Culture adequate volume   Culture   Final    NO GROWTH 5 DAYS Performed at [facility], [address] [place], [place] [address]    Report Status 08/02/2023 FINAL  Final         Radiology Studies: No results found.        Scheduled Meds:  amLODipine   10 mg Oral QHS   amoxicillin   1,000 mg Oral Q8H   aspirin  EC  81 mg Oral Daily    atorvastatin   40 mg Oral QHS   carvedilol   12.5 mg Oral BID WC   cyanocobalamin   1,000 mcg Oral Daily   folic acid   1 mg Oral Daily   gabapentin   100 mg Oral BID   lacosamide   150 mg Oral BID   levETIRAcetam   1,500 mg Oral BID   multivitamin with minerals  1 tablet Oral Daily   polyethylene glycol  17 g Oral Daily   rivaroxaban   20 mg Oral Q supper   tamsulosin   0.4 mg Oral QHS   thiamine   100 mg Oral Daily   Or   thiamine   100 mg Intravenous Daily   Continuous Infusions:      LOS: 10 days     Raymonde Calico, MD [facility]  If 7PM-7AM, please contact night-coverage [email or web address] 08/03/2023, 4:23 PM

## 2023-08-03 NOTE — Progress Notes (Signed)
 Physical Therapy Treatment Patient Details Name: [name] MRN: [id number] DOB: 29-Apr-1950 Today's Date: 08/03/2023   History of Present Illness John Spears is a 73 y.o. male with medical history significant of seizure, HTN, H LD, dCHF, stroke, A fib on Xarelto , BPH, who presents with tremor, Pt was seen in ED this AM, and had negative work up. Pt was discharged home and instructed to return to his normal Keppra  dosing. After went home, pt developed right hand shaking, right facial droop and became unresponsive per her sister. Pt was brought back to ED again. 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  2. Stable remote left parietal cortical infarct.  3. Stable mild periventricular white matter changes likely  reflecting the sequela of small vessel ischemia.    PT Comments  Today's tx involved continuation of therex with education on use of AD. Pt was able to tolerate standing exercises well, needing consistent verbal and tactile cues to complete exercises, no LOB experienced throughout. Balance and safety continue to be a concern as pt requires consistent cueing on use of RW to decrease chance of LOB. Ambulation was deferred this date d/t pt being fatigued from walking with mobility specialists earlier. Pt will continue to benefit from skilled PT interventions to continue to address balance and ambulation deficits to maximize QoL. PT to follow acutely.    If plan is discharge home, recommend the following: Assistance with cooking/housework;Assist for transportation;Help with stairs or ramp for entrance;A lot of help with walking and/or transfers;A little help with bathing/dressing/bathroom;Direct supervision/assist for medications management   Can travel by private vehicle     Yes  Equipment  Recommendations  Other (comment)    Recommendations for Other Services       Precautions / Restrictions Precautions Precautions: Fall Recall of Precautions/Restrictions: Impaired Restrictions Weight Bearing Restrictions Per Provider Order: No     Mobility  Bed Mobility Overal bed mobility: Needs Assistance Bed Mobility: Supine to Sit     Supine to sit: Supervision, HOB elevated     General bed mobility comments: verbal and tactile cueing for initiation of movement    Transfers Overall transfer level: Needs assistance Equipment used: Rolling walker (2 wheels) Transfers: Sit to/from Stand Sit to Stand: Contact guard assist           General transfer comment: cues for hand placement prior to transfer. Once standing, upright posture noted. RW used for safety    Ambulation/Gait Ambulation/Gait assistance: Contact guard assist Gait Distance (Feet): 10 Feet Assistive device: Rolling walker (2 wheels) Gait Pattern/deviations: Decreased step length - right, Decreased step length - left, Step-to pattern       General Gait Details: verbal and tactile cues given for manipulation of RW and safety   Stairs             Wheelchair Mobility     Tilt Bed    Modified Rankin (Stroke Patients Only)       Balance Overall balance assessment: Needs assistance Sitting-balance support: Feet supported Sitting balance-Leahy Scale: Fair     Standing balance support: Bilateral upper extremity supported Standing balance-Leahy Scale: Fair Standing balance comment: tremulous with fatigue, frequent verb cues for safety and use of RW  Communication Communication Communication: Impaired Factors Affecting Communication: Difficulty expressing self;Reduced clarity of speech  Cognition Arousal: Alert Behavior During Therapy: Flat affect, Impulsive   PT - Cognitive impairments: No family/caregiver present to determine baseline                        PT - Cognition Comments: alert and oriented, however confused on higher level questioning. With fatigue, cognition worsens Following commands: Impaired Following commands impaired: Follows one step commands inconsistently    Cueing Cueing Techniques: Tactile cues, Gestural cues, Verbal cues  Exercises General Exercises - Lower Extremity Hip ABduction/ADduction: AROM, 15 reps, Standing, Both Straight Leg Raises: AROM, Both, 15 reps, Standing Hip Flexion/Marching: AROM, Both, 15 reps, Standing Heel Raises: AROM, Both, 15 reps, Standing Mini-Sqauts: AROM, Both, 15 reps, Standing    General Comments        Pertinent Vitals/Pain Pain Assessment Pain Assessment: No/denies pain    Home Living                          Prior Function            PT Goals (current goals can now be found in the care plan section) Acute Rehab PT Goals Patient Stated Goal: to go home PT Goal Formulation: With patient Time For Goal Achievement: 08/08/23 Potential to Achieve Goals: Fair Progress towards PT goals: Progressing toward goals    Frequency    Min 2X/week      PT Plan      Co-evaluation     PT goals addressed during session: Mobility/safety with mobility        AM-PAC PT "6 Clicks" Mobility   Outcome Measure  Help needed turning from your back to your side while in a flat bed without using bedrails?: A Little Help needed moving from lying on your back to sitting on the side of a flat bed without using bedrails?: A Little Help needed moving to and from a bed to a chair (including a wheelchair)?: A Little Help needed standing up from a chair using your arms (e.g., wheelchair or bedside chair)?: A Little Help needed to walk in hospital room?: A Lot Help needed climbing 3-5 steps with a railing? : A Lot 6 Click Score: 16    End of Session   Activity Tolerance: Patient limited by fatigue Patient left: in bed;with call bell/phone within  reach;with bed alarm set;with nursing/sitter in room Nurse Communication: Mobility status PT Visit Diagnosis: Other abnormalities of gait and mobility (R26.89);Difficulty in walking, not elsewhere classified (R26.2);Muscle weakness (generalized) (M62.81)     Time: 1610-9604 PT Time Calculation (min) (ACUTE ONLY): 19 min  Charges:                               Nasteho Glantz Romero-Perozo, SPT  08/03/2023, 4:30 PM

## 2023-08-03 NOTE — Progress Notes (Signed)
 Mobility Specialist - Progress Note   08/03/23 1500  Mobility  Activity Ambulated with assistance in hallway;Transferred from bed to chair  Level of Assistance Contact guard assist, steadying assist  Assistive Device Front wheel walker  Distance Ambulated (ft) 480 ft  Activity Response Tolerated well  Mobility visit 1 Mobility     Pt sleeping in bed upon arrival, utilizing RA. Pt awakened to voice and agreeable to activity. Pt completed bed mobility modI. STS and ambulation with CGA. Impulsive. Pt ambulated 3 laps around NS with cues for slowing pace and direction. Pt returned to chair with alarm set, needs in reach.    Searcy Czech Mobility Specialist 08/03/23, 4:07 PM

## 2023-08-04 DIAGNOSIS — Z8673 Personal history of transient ischemic attack (TIA), and cerebral infarction without residual deficits: Secondary | ICD-10-CM | POA: Diagnosis not present

## 2023-08-04 DIAGNOSIS — N401 Enlarged prostate with lower urinary tract symptoms: Secondary | ICD-10-CM | POA: Diagnosis not present

## 2023-08-04 DIAGNOSIS — E785 Hyperlipidemia, unspecified: Secondary | ICD-10-CM | POA: Diagnosis not present

## 2023-08-04 DIAGNOSIS — R569 Unspecified convulsions: Secondary | ICD-10-CM | POA: Diagnosis not present

## 2023-08-04 MED ORDER — AMOXICILLIN 500 MG PO CAPS: 1000.0000 mg | ORAL_CAPSULE | Freq: Three times a day (TID) | ORAL | Status: AC

## 2023-08-04 MED ORDER — ACETAMINOPHEN 500 MG PO TABS
500.0000 mg | ORAL_TABLET | Freq: Four times a day (QID) | ORAL | 0 refills | Status: AC | PRN
Start: 2023-08-04 — End: ?

## 2023-08-04 NOTE — TOC Transition Note (Addendum)
 Transition of Care [facility]) - Discharge Note   Patient Details  Name: John Spears MRN: [id number] Date of Birth: 01/08/1951  [facility]) CM/SW Contact:  Crayton Docker, RN 08/04/2023, 9:53 AM   Clinical Narrative:     Alert received from Leoma Raja, CMA regarding auth approved, SNF approved 08/03/23-08/05/23 auth id [id number]. CM secure message and call to Gena R, Admissions, [facility], phone: [phone], and patient admitting today to SNF. Per Gena, patient can admit to SNF to room 704, private room and CM can schedule BLS transport for 1100. CM alert to Dr. Marquette Sites regarding [facility] approval and SNF accepting patient for admission today. Per Dr. Dezii, patient can discharge after 1300. CM call to [employer], phone: [phone]. CM spoke to [place]. BLS transport scheduled for 1330 today. CM call placed to patient's son, Zedric, phone: [phone], no answer, left message for return call. CM call to patient's sister, Andriette Banner, phone: [phone], phone has calling restrictions that do not allow for messages. CM call to patient's niece, Arlie Riker, phone: [phone], no answer, CM left message for return call.   Call received from patient's son, Maria. CM informed of patient's pending discharge to [facility]. Per Jacqulyne Maxim, verbalized understanding and agreement.   Discharge summary noted and faxed to [facility]. Call received from [facility] regarding BLS transport rescheduling to now due to schedule. CM alert to Dr. Dezii and RN Merilyn regarding updated BLS transport time.  CM secure message to Gena R, Admissions, [facility] regarding updated BLS transport and discharge summary available for review. Per SNF, requests RN report at this time. CM alert to RN Merilyn regarding SNF request for RN report.  Final next level of care: Skilled Nursing Facility Barriers to Discharge: No Barriers  Identified   Patient Goals and CMS Choice    SNF   Discharge Placement      SNF          Discharge Plan and Services Additional resources added to the After Visit Summary for       Social Drivers of Health (SDOH) Interventions SDOH Screenings   Food Insecurity: Patient Unable To Answer (07/26/2023)  Housing: Low Risk  (04/06/2023)   Received from [facility]  Transportation Needs: Unmet Transportation Needs (04/06/2023)   Received from [facility]  Utilities: Not At Risk (04/06/2023)   Received from [facility]  Financial Resource Strain: Medium Risk (04/06/2023)   Received from [facility]  Social Connections: Unknown (03/12/2023)  Tobacco Use: Medium Risk (07/23/2023)     Readmission Risk Interventions     No data to display

## 2023-08-04 NOTE — Discharge Summary (Signed)
 Physician Discharge Summary   Patient: John Spears MRN: [id number] DOB: 01-19-1951  Admit date:     07/23/2023  Discharge date: 08/04/23  Discharge Physician: Roise Cleaver   PCP: Patient, No Pcp Per   Recommendations at discharge:   Follow-up with neurology for continued seizure management Follow-up with primary care for chronic medication management  Discharge Diagnoses: Principal Problem:   Seizure (HCC) Active Problems:   History of CVA (cerebrovascular accident)   Toxic metabolic encephalopathy   Paroxysmal atrial fibrillation (HCC)   Essential hypertension   HLD (hyperlipidemia)   Chronic diastolic CHF (congestive heart failure) (HCC)   BPH (benign prostatic hyperplasia)   Alcohol abuse   Seizures (HCC)  Resolved Problems:   * No resolved hospital problems. *  Hospital Course: John Spears is a 73 year old male with history of seizure disorder, hypertension, hyperlipidemia, diastolic CHF, prior CVA, A-fib on Xarelto , BPH, who presents with tremors.  Patient has recently been off Keppra  due to a dental procedure 2 days prior to arrival.  Not long after arrival patient began seizing which was unresponsive to medication.  Patient was transferred to stepdown unit for status epilepticus. He was found to have E. coli bacteremia.  Workup was unrevealing for possible source.  CT abdomen pelvis had no acute findings and blood culture did not reveal E. coli.  Infectious disease was consulted who recommended 10 days of Augmentin  to complete on 6/12. Seizure was thought to be secondary to medication noncompliance complicated by acute infection. Patient will be discharging today to skilled nursing facility.  Breakthrough seizure - Secondary to medication non adherence and acute infection -Neurology consulted agrees with current dose lamotrigine  and keppra   E. coli Bacteremia - Source unclear.  Possibly urine.  Culture with insignificant growth.  CT abdomen pelvis with  no acute findings - Hardware to right hip - Bladder scan negative - Infectious disease consulted - 10-day antibiotic treatment recommended.  Complete amoxicillin  through 6/12  Hypokalemia - Resolved  History of CVA - Continue statin and aspirin   Paroxysmal A-fib - Resume home meds  Hypertension - Continue home meds  Hyperlipidemia - Continue statin  Chronic diastolic CHF - Echo 06/2022: EF 60 to 65%, grade 1 diastolic dysfunction.  Clinically euvolemic  BPH - Resume Flomax   Alcohol abuse - Last drink 2 weeks prior to arrival.  Normocytic anemia - Hemoglobin stable  Debility - Physical therapy has advised SNF, family in agreement - Discharge to SNF today  Consultants: Infectious disease, neurology Disposition: Skilled nursing facility Diet recommendation:  Discharge Diet Orders (From admission, onward)     Start     Ordered   08/04/23 0000  Diet general        08/04/23 1137           Dysphagia type 2 thin Liquid DISCHARGE MEDICATION: Allergies as of 08/04/2023   No Known Allergies      Medication List     STOP taking these medications    ARTHRITIS STRENGTH BC POWDER PO   chlorhexidine  0.12 % solution Commonly known as: PERIDEX    HYDROcodone-acetaminophen  5-325 MG tablet Commonly known as: NORCO/VICODIN   lidocaine  5 % Commonly known as: Lidoderm    magnesium  oxide 400 MG tablet Commonly known as: MAG-OX   oxyCODONE -acetaminophen  5-325 MG tablet Commonly known as: Percocet       TAKE these medications    acetaminophen  500 MG tablet Commonly known as: TYLENOL  Take 1 tablet (500 mg total) by mouth every 6 (six) hours as needed for  moderate pain (pain score 4-6). What changed: how much to take   amLODipine  10 MG tablet Commonly known as: NORVASC  Take 10 mg by mouth at bedtime.   amoxicillin  500 MG capsule Commonly known as: AMOXIL  Take 2 capsules (1,000 mg total) by mouth every 8 (eight) hours for 1 day.   Aspirin  Low Dose 81  MG tablet Generic drug: aspirin  EC Take 81 mg by mouth daily.   atorvastatin  40 MG tablet Commonly known as: LIPITOR Take 40 mg by mouth at bedtime.   carvedilol  12.5 MG tablet Commonly known as: COREG  Take 1 tablet (12.5 mg total) by mouth 2 (two) times daily.   cyanocobalamin  1000 MCG tablet Commonly known as: VITAMIN B12 Take 1,000 mcg by mouth in the morning.   feeding supplement Liqd Take 237 mLs by mouth 2 (two) times daily between meals.   folic acid  1 MG tablet Commonly known as: FOLVITE  Take 1 mg by mouth in the morning.   furosemide  20 MG tablet Commonly known as: LASIX  TAKE 1 TABLET BY MOUTH ONCE A DAY   gabapentin  100 MG capsule Commonly known as: NEURONTIN  Take 100 mg by mouth 2 (two) times daily.   Lacosamide  150 MG Tabs Take 1 tablet (150 mg total) by mouth 2 (two) times daily.   levETIRAcetam  750 MG tablet Commonly known as: KEPPRA  Take 2 tablets (1,500 mg total) by mouth 2 (two) times daily.   multivitamin with minerals Tabs tablet Take 1 tablet by mouth daily. What changed: when to take this   rivaroxaban  20 MG Tabs tablet Commonly known as: XARELTO  Take 1 tablet (20 mg total) by mouth daily with supper.   tamsulosin  0.4 MG Caps capsule Commonly known as: FLOMAX  Take 0.4 mg by mouth at bedtime.   thiamine  100 MG tablet Commonly known as: VITAMIN B1 Take 100 mg by mouth in the morning.        Contact information for after-discharge care     Destination     [facility] Preferred SNF .   Service: Skilled Nursing Contact information: [address] [place]  [address] [phone]                    Discharge Exam: Filed Weights   07/31/23 0459 08/01/23 0500 08/02/23 1923  Weight: 72.4 kg 73.8 kg 77.6 kg   Constitutional:  Normal appearance. Non toxic-appearing.  HENT: Head Normocephalic and atraumatic.  Mucous membranes are moist.  Gums are nonerythematous, not bleeding, not  oozing Eyes:  Extraocular intact. Conjunctivae normal. Pupils are equal, round, and reactive to light.  Cardiovascular: Rate and Rhythm: Normal rate and regular rhythm.  Pulmonary: Non labored, symmetric rise of chest wall.  Musculoskeletal:  Normal range of motion.  Skin: warm and dry. not jaundiced.  Neurological: Alert, follows commands.  Reports the year is 92 and Rupert Counts is the president.  Otherwise oriented to self and situation. Psychiatric: Mood and Affect congruent.    Condition at discharge: stable  The results of significant diagnostics from this hospitalization (including imaging, microbiology, ancillary and laboratory) are listed below for reference.   Imaging Studies: CT ABDOMEN PELVIS W CONTRAST Result Date: 07/28/2023 CLINICAL DATA:  Bacteremia. EXAM: CT ABDOMEN AND PELVIS WITH CONTRAST TECHNIQUE: Multidetector CT imaging of the abdomen and pelvis was performed using the standard protocol following bolus administration of intravenous contrast. RADIATION DOSE REDUCTION: This exam was performed according to the departmental dose-optimization program which includes automated exposure control, adjustment of the mA and/or  kV according to patient size and/or use of iterative reconstruction technique. CONTRAST:  OMNIPAQUE  IOHEXOL  300 MG/ML  SOLN COMPARISON:  CT 06/05/2023, MRI 07/17/2023 FINDINGS: Lower chest: No basilar airspace disease or pleural effusion. Mild cardiomegaly. Hepatobiliary: No focal hepatic abnormality. Contracted gallbladder. No calcified gallstone. No biliary dilatation. Pancreas: No ductal dilatation or inflammation. Enhancing focus in the pancreatic tail, series 5, image 53 was characterized on MRI. Spleen: Normal in size without focal abnormality. Adrenals/Urinary Tract: No adrenal nodule. No hydronephrosis or renal inflammation. Nonobstructing stone in the left kidney. The urinary bladder is partially distended. No definite wall thickening, streak artifact from  right hip arthroplasty obscures detailed assessment Stomach/Bowel: No bowel obstruction or inflammation. Small to moderate colonic stool burden. The sigmoid colon is redundant. The appendix is normal. Vascular/Lymphatic: Normal caliber abdominal aorta. Aortic atherosclerosis. No enlarged lymph nodes in the abdomen or pelvis. Reproductive: Prostate is unremarkable. Other: No ascites or free air.  No abdominal wall hernia. Musculoskeletal: No acute findings. Right hip arthroplasty. No muscular findings to account for bacteremia. IMPRESSION: 1. No acute abnormality in the abdomen/pelvis. 2. Nonobstructing left renal stone. 3. Unchanged enhancing focus in the pancreatic tail, recently characterized on MRI. Aortic Atherosclerosis (ICD10-I70.0). Electronically Signed   By: Chadwick Colonel M.D.   On: 07/28/2023 20:11   DG Chest Port 1 View Result Date: 07/26/2023 CLINICAL DATA:  Fever and altered mental status. EXAM: PORTABLE CHEST 1 VIEW COMPARISON:  07/24/2023 FINDINGS: Lung volumes are low. Minor atelectasis at the left lung base. No confluent airspace disease. Stable heart size and mediastinal contours. No pulmonary edema. No significant pleural effusion. No pneumothorax. IMPRESSION: Low lung volumes with minor left basilar atelectasis. Electronically Signed   By: Chadwick Colonel M.D.   On: 07/26/2023 18:09   EEG adult Result Date: 07/26/2023 Arleene Lack, MD     07/26/2023  1:11 PM Patient Name: John Spears MRN: [id number] Epilepsy Attending: Arleene Lack Referring Physician/Provider: Niu, Xilin, MD Date: 07/26/2023 Duration: 30.16 mins Patient history: 73yo M s/p cardiac arrest. EEG to evaluate for seizure Level of alertness: Asleep AEDs during EEG study: GBP, LEV, LCM, Ativan  Technical aspects: This EEG study was done with scalp electrodes positioned according to the 10-20 International system of electrode placement. Electrical activity was reviewed with band pass filter of 1-70Hz , sensitivity of 7  uV/mm, display speed of 56mm/sec with a 60Hz  notched filter applied as appropriate. EEG data were recorded continuously and digitally stored.  Video monitoring was available and reviewed as appropriate. Description: Sleep was characterized by vertex waves, sleep spindles (12 to 14 Hz), maximal frontocentral region.  Hyperventilation and photic stimulation were not performed.   Study was technically difficult due to significant sweat artifact. IMPRESSION: This technically difficult study during sleep only is within normal limits. No seizures or epileptiform discharges were seen throughout the recording. Arleene Lack   DG Chest 1 View Result Date: 07/24/2023 CLINICAL DATA:  Aspiration. EXAM: CHEST  1 VIEW COMPARISON:  06/12/2023, additional priors reviewed FINDINGS: Lung volumes are low. Stable heart size and mediastinal contours allowing for differences in technique. Minor atelectasis at the left lung base. No pulmonary edema, pleural effusion, or pneumothorax. Chronic sclerosis within right lateral lower ribs. IMPRESSION: Low lung volumes with minor left basilar atelectasis. Electronically Signed   By: Chadwick Colonel M.D.   On: 07/24/2023 16:03   Rapid EEG Result Date: 07/24/2023 Arleene Lack, MD     07/24/2023 10:53 AM Patient Name: John Spears MRN: [id number]  Epilepsy Attending: Arleene Lack Referring Physician/Provider: Gideon Kussmaul, NP Duration: 07/24/2023 0211 to 07/24/2023 0912 Patient history: 73 y.o. male with medical history significant of seizure, HTN, H LD, dCHF, stroke, A fib on Xarelto , BPH, who presents with tremor. EEG to evaluate for seizure Level of alertness: asleep/ lethargic AEDs during EEG study: LEV, LCM, GBP, Ativan  Technical aspects: This EEG was obtained using a 10 lead EEG system positioned circumferentially without any parasagittal coverage (rapid EEG). Computer selected EEG is reviewed as  well as background features and all clinically significant  events. Description: Sleep was characterized by sleep spindles (12-14hz ), maximal fronto-central region.  EEG showed continuous generalized and lateralized left hemisphere 3 to 6 Hz theta-delta slowing with overriding 15 to 18 Hz beta activity distributed symmetrically and diffusely. Hyperventilation and photic stimulation were not performed.  Of note, parts of study were technically difficult due to significant electrode artifact.    ABNORMALITY - Continuous slow, generalized and lateralized left hemisphere  IMPRESSION: This limited ceribell study is suggestive of cortical dysfunction arising from left hemisphere likely secondary to underlying structural abnormality, post-ictal state. Additionally there is moderate to severe diffuse encephalopathy. No seizures or epileptiform discharges were seen throughout the recording.  Priyanka Suzanne Erps    CT HEAD WO CONTRAST ( ) Result Date: 07/23/2023 CLINICAL DATA:  Altered mental status, right facial droop EXAM: CT HEAD WITHOUT CONTRAST TECHNIQUE: Contiguous axial images were obtained from the base of the skull through the vertex without intravenous contrast. RADIATION DOSE REDUCTION: This exam was performed according to the departmental dose-optimization program which includes automated exposure control, adjustment of the mA and/or kV according to patient size and/or use of iterative reconstruction technique. COMPARISON:  06/12/2023 FINDINGS: Brain: Normal anatomic configuration. Parenchymal volume loss is commensurate with the patient's age. Stable mild periventricular white matter changes are present likely reflecting the sequela of small vessel ischemia. Remote left parietal cortical infarct again noted. No abnormal intra or extra-axial mass lesion or fluid collection. No abnormal mass effect or midline shift. No evidence of acute intracranial hemorrhage or infarct. Ventricular size is normal. Cerebellum unremarkable. Vascular: No asymmetric hyperdense vasculature  at the skull base. Skull: Intact Sinuses/Orbits: Paranasal sinuses are clear. Orbits are unremarkable. Other: Mastoid air cells and middle ear cavities are clear. IMPRESSION: 1. No evidence of acute intracranial hemorrhage or infarct. 2. Stable remote left parietal cortical infarct. 3. Stable mild periventricular white matter changes likely reflecting the sequela of small vessel ischemia. Electronically Signed   By: Worthy Heads M.D.   On: 07/23/2023 20:46   MR ABDOMEN MRCP W WO CONTAST Result Date: 07/19/2023 CLINICAL DATA:  Characterize suspected pancreatic tail mass incidentally identified by prior CT EXAM: MRI ABDOMEN WITHOUT AND WITH CONTRAST (INCLUDING MRCP) TECHNIQUE: Multiplanar multisequence MR imaging of the abdomen was performed both before and after the administration of intravenous contrast. Heavily T2-weighted images of the biliary and pancreatic ducts were obtained, and three-dimensional MRCP images were rendered by post processing. CONTRAST:  7.5mL GADAVIST  GADOBUTROL  1 MMOL/ML IV SOLN COMPARISON:  CT abdomen pelvis, 06/05/2023 FINDINGS: Lower chest: No acute abnormality. Hepatobiliary: No solid liver abnormality is seen. No gallstones, gallbladder wall thickening, or biliary dilatation. Pancreas: No significant change in a tiny, subtly hyperenhancing nodule of the distal pancreatic tail measuring 1.0 x 0.8 cm (series 19, image 38). Although this is closely isoenhancing to spleen on several sequences this is less clearly isosignal on noncontrast sequences. No pancreatic ductal dilatation or surrounding inflammatory changes. Spleen: Normal in size without  significant abnormality. Adrenals/Urinary Tract: Adrenal glands are unremarkable. Kidneys are normal, without renal calculi, solid lesion, or hydronephrosis. Stomach/Bowel: Stomach is within normal limits. No evidence of bowel wall thickening, distention, or inflammatory changes. Vascular/Lymphatic: Aortic atherosclerosis. No enlarged abdominal  lymph nodes. Other: No abdominal wall hernia or abnormality. No ascites. Musculoskeletal: No acute or significant osseous findings. IMPRESSION: 1. No significant change in a tiny, subtly hyperenhancing nodule of the distal pancreatic tail measuring 1.0 x 0.8 cm. Although this is closely isoenhancing to spleen on several sequences this is less clearly isosignal on noncontrast sequences. This is of uncertain nature, possibly a small intrapancreatic splenule or small pancreatic neuroendocrine tumor. Consider continued imaging surveillance in addition to biochemical assessment. 2. No evidence of lymphadenopathy or metastatic disease in the abdomen. Electronically Signed   By: Fredricka Jenny M.D.   On: 07/19/2023 12:17   MR 3D Recon At Scanner Result Date: 07/19/2023 CLINICAL DATA:  Characterize suspected pancreatic tail mass incidentally identified by prior CT EXAM: MRI ABDOMEN WITHOUT AND WITH CONTRAST (INCLUDING MRCP) TECHNIQUE: Multiplanar multisequence MR imaging of the abdomen was performed both before and after the administration of intravenous contrast. Heavily T2-weighted images of the biliary and pancreatic ducts were obtained, and three-dimensional MRCP images were rendered by post processing. CONTRAST:  7.5mL GADAVIST  GADOBUTROL  1 MMOL/ML IV SOLN COMPARISON:  CT abdomen pelvis, 06/05/2023 FINDINGS: Lower chest: No acute abnormality. Hepatobiliary: No solid liver abnormality is seen. No gallstones, gallbladder wall thickening, or biliary dilatation. Pancreas: No significant change in a tiny, subtly hyperenhancing nodule of the distal pancreatic tail measuring 1.0 x 0.8 cm (series 19, image 38). Although this is closely isoenhancing to spleen on several sequences this is less clearly isosignal on noncontrast sequences. No pancreatic ductal dilatation or surrounding inflammatory changes. Spleen: Normal in size without significant abnormality. Adrenals/Urinary Tract: Adrenal glands are unremarkable. Kidneys are  normal, without renal calculi, solid lesion, or hydronephrosis. Stomach/Bowel: Stomach is within normal limits. No evidence of bowel wall thickening, distention, or inflammatory changes. Vascular/Lymphatic: Aortic atherosclerosis. No enlarged abdominal lymph nodes. Other: No abdominal wall hernia or abnormality. No ascites. Musculoskeletal: No acute or significant osseous findings. IMPRESSION: 1. No significant change in a tiny, subtly hyperenhancing nodule of the distal pancreatic tail measuring 1.0 x 0.8 cm. Although this is closely isoenhancing to spleen on several sequences this is less clearly isosignal on noncontrast sequences. This is of uncertain nature, possibly a small intrapancreatic splenule or small pancreatic neuroendocrine tumor. Consider continued imaging surveillance in addition to biochemical assessment. 2. No evidence of lymphadenopathy or metastatic disease in the abdomen. Electronically Signed   By: Fredricka Jenny M.D.   On: 07/19/2023 12:17    Microbiology: Results for orders placed or performed during the hospital encounter of 07/23/23  MRSA Next Gen by PCR, Nasal     Status: None   Collection Time: 07/24/23  2:51 AM   Specimen: Nasal Mucosa; Nasal Swab  Result Value Ref Range Status   MRSA by PCR Next Gen NOT DETECTED NOT DETECTED Final    Comment: (NOTE) The GeneXpert MRSA Assay (FDA approved for NASAL specimens only), is one component of a comprehensive MRSA colonization surveillance program. It is not intended to diagnose MRSA infection nor to guide or monitor treatment for MRSA infections. Test performance is not FDA approved in patients less than 24 years old. Performed at [facility], [address] [place], [place] [address]   Culture, blood (Routine X 2) w Reflex to ID Panel  Status: Abnormal   Collection Time: 07/26/23 12:57 PM   Specimen: BLOOD  Result Value Ref Range Status   Specimen Description   Final    BLOOD BLOOD LEFT ARM Performed at  [facility], [address], [place], [place] [address]    Special Requests   Final    BOTTLES DRAWN AEROBIC AND ANAEROBIC Blood Culture adequate volume Performed at [facility] [address] [place], [place] [address]    Culture  Setup Time   Final    GRAM NEGATIVE RODS IN BOTH AEROBIC AND ANAEROBIC BOTTLES CRITICAL RESULT CALLED TO, READ BACK BY AND VERIFIED WITH:  JASON ROBINS AT 1308 07/27/23 JG GRAM STAIN REVIEWED-AGREE WITH RESULT DRT Performed at [facility], [address] [place], [place] [address]    Culture ESCHERICHIA COLI (A)  Final   Report Status 07/29/2023 FINAL  Final   Organism ID, Bacteria ESCHERICHIA COLI  Final   Organism ID, Bacteria ESCHERICHIA COLI  Final      Susceptibility   Escherichia coli - KIRBY BAUER*    CEFAZOLIN SENSITIVE Sensitive    Escherichia coli - MIC*    AMPICILLIN  4 SENSITIVE Sensitive     CEFEPIME <=0.12 SENSITIVE Sensitive     CEFTAZIDIME <=1 SENSITIVE Sensitive     CEFTRIAXONE  <=0.25 SENSITIVE Sensitive     CIPROFLOXACIN <=0.25 SENSITIVE Sensitive     GENTAMICIN <=1 SENSITIVE Sensitive     IMIPENEM <=0.25 SENSITIVE Sensitive     TRIMETH/SULFA <=20 SENSITIVE Sensitive     AMPICILLIN /SULBACTAM <=2 SENSITIVE Sensitive     PIP/TAZO <=4 SENSITIVE Sensitive ug/mL    * ESCHERICHIA COLI    ESCHERICHIA COLI  Blood Culture ID Panel (Reflexed)     Status: Abnormal   Collection Time: 07/26/23 12:57 PM  Result Value Ref Range Status   Enterococcus faecalis NOT DETECTED NOT DETECTED Final   Enterococcus Faecium NOT DETECTED NOT DETECTED Final   Listeria monocytogenes NOT DETECTED NOT DETECTED Final   Staphylococcus species NOT DETECTED NOT DETECTED Final   Staphylococcus aureus (BCID) NOT DETECTED NOT DETECTED Final   Staphylococcus epidermidis NOT DETECTED NOT DETECTED Final   Staphylococcus lugdunensis NOT DETECTED NOT DETECTED Final   Streptococcus species NOT DETECTED NOT DETECTED Final   Streptococcus  agalactiae NOT DETECTED NOT DETECTED Final   Streptococcus pneumoniae NOT DETECTED NOT DETECTED Final   Streptococcus pyogenes NOT DETECTED NOT DETECTED Final   A.calcoaceticus-baumannii NOT DETECTED NOT DETECTED Final   Bacteroides fragilis NOT DETECTED NOT DETECTED Final   Enterobacterales DETECTED (A) NOT DETECTED Final    Comment: Enterobacterales represent a large order of gram negative bacteria, not a single organism. CRITICAL RESULT CALLED TO, READ BACK BY AND VERIFIED WITH:  JASON ROBINS AT 0123 07/27/23 JG    Enterobacter cloacae complex NOT DETECTED NOT DETECTED Final   Escherichia coli DETECTED (A) NOT DETECTED Final    Comment: CRITICAL RESULT CALLED TO, READ BACK BY AND VERIFIED WITH:  JASON ROBINS AT 0123 07/27/23 JG    Klebsiella aerogenes NOT DETECTED NOT DETECTED Final   Klebsiella oxytoca NOT DETECTED NOT DETECTED Final   Klebsiella pneumoniae NOT DETECTED NOT DETECTED Final   Proteus species NOT DETECTED NOT DETECTED Final   Salmonella species NOT DETECTED NOT DETECTED Final   Serratia marcescens NOT DETECTED NOT DETECTED Final   Haemophilus influenzae NOT DETECTED NOT DETECTED Final   Neisseria meningitidis NOT DETECTED NOT DETECTED Final   Pseudomonas aeruginosa NOT DETECTED NOT DETECTED Final   Stenotrophomonas maltophilia  NOT DETECTED NOT DETECTED Final   Candida albicans NOT DETECTED NOT DETECTED Final   Candida auris NOT DETECTED NOT DETECTED Final   Candida glabrata NOT DETECTED NOT DETECTED Final   Candida krusei NOT DETECTED NOT DETECTED Final   Candida parapsilosis NOT DETECTED NOT DETECTED Final   Candida tropicalis NOT DETECTED NOT DETECTED Final   Cryptococcus neoformans/gattii NOT DETECTED NOT DETECTED Final   CTX-M ESBL NOT DETECTED NOT DETECTED Final   Carbapenem resistance IMP NOT DETECTED NOT DETECTED Final   Carbapenem resistance KPC NOT DETECTED NOT DETECTED Final   Carbapenem resistance NDM NOT DETECTED NOT DETECTED Final   Carbapenem resist OXA  48 LIKE NOT DETECTED NOT DETECTED Final   Carbapenem resistance VIM NOT DETECTED NOT DETECTED Final    Comment: Performed at [facility], [address] [place], [place] [address]  Culture, blood (Routine X 2) w Reflex to ID Panel     Status: Abnormal   Collection Time: 07/26/23  1:03 PM   Specimen: BLOOD  Result Value Ref Range Status   Specimen Description BLOOD BLOOD RIGHT ARM  Final   Special Requests   Final    BOTTLES DRAWN AEROBIC AND ANAEROBIC Blood Culture adequate volume   Culture  Setup Time   Final    GRAM NEGATIVE RODS AEROBIC BOTTLE ONLY CRITICAL VALUE NOTED.  VALUE IS CONSISTENT WITH PREVIOUSLY REPORTED AND CALLED VALUE. GRAM STAIN REVIEWED-AGREE WITH RESULT DRT    Culture (A)  Final    ESCHERICHIA COLI SUSCEPTIBILITIES PERFORMED ON PREVIOUS CULTURE WITHIN THE LAST 5 DAYS.    Report Status 07/29/2023 FINAL  Final  Urine Culture (for pregnant, neutropenic or urologic patients or patients with an indwelling urinary catheter)     Status: Abnormal   Collection Time: 07/27/23  8:49 AM   Specimen: Urine, Clean Catch  Result Value Ref Range Status   Specimen Description   Final    URINE, CLEAN CATCH Performed at [facility], [address], [place], [place] [address]    Special Requests   Final    NONE Performed at [facility], [address] [place], [place] [address]    Culture (A)  Final    5,000 COLONIES/mL STAPHYLOCOCCUS EPIDERMIDIS 1,000 COLONIES/mL ENTEROCOCCUS FAECALIS CALL MICROBIOLOGY LAB IF SENSITIVITIES ARE REQUIRED. Performed at [facility], [address] [place], [place] [address]    Report Status 07/30/2023 FINAL  Final  Culture, blood (Routine X 2) w Reflex to ID Panel     Status: None   Collection Time: 07/28/23  8:23 AM   Specimen: BLOOD  Result Value Ref Range Status   Specimen Description BLOOD LEFT ANTECUBITAL  Final   Special Requests   Final    BOTTLES DRAWN AEROBIC AND ANAEROBIC Blood Culture  adequate volume   Culture   Final    NO GROWTH 5 DAYS Performed at [facility], [address] [place], [place] [address]    Report Status 08/02/2023 FINAL  Final  Culture, blood (Routine X 2) w Reflex to ID Panel     Status: None   Collection Time: 07/28/23  8:31 AM   Specimen: BLOOD  Result Value Ref Range Status   Specimen Description BLOOD RIGHT ANTECUBITAL  Final   Special Requests   Final    BOTTLES DRAWN AEROBIC AND ANAEROBIC Blood Culture adequate volume   Culture   Final    NO GROWTH 5 DAYS Performed at [facility], [address] [place],  [place] [address]    Report Status 08/02/2023 FINAL  Final    Labs: CBC: Recent Labs  Lab 07/29/23 0251 07/31/23 0420 08/01/23 0419  WBC 11.6* 6.2 7.0  HGB 8.3* 8.9* 8.5*  HCT 26.4* 27.4* 26.4*  MCV 89.2 87.0 86.0  PLT 162 221 258   Basic Metabolic Panel: Recent Labs  Lab 07/29/23 0251 07/31/23 0420 08/01/23 0419  NA 140 141 138  K 4.0 3.9 4.2  CL 109 109 108  CO2 23 24 24   GLUCOSE 102* 93 86  BUN 17 18 19   CREATININE 1.01 0.98 0.98  CALCIUM  9.0 9.2 9.3   Liver Function Tests: No results for input(s): AST, ALT, ALKPHOS, BILITOT, PROT, ALBUMIN in the last 168 hours. CBG: No results for input(s): GLUCAP in the last 168 hours.  Discharge time spent: 32 minutes.  Signed: Hau Sanor, DO [facility] 08/04/2023

## 2023-08-04 NOTE — Hospital Course (Addendum)
 John Spears is a 73 year old male with history of seizure disorder, hypertension, hyperlipidemia, diastolic CHF, prior CVA, A-fib on Xarelto , BPH, who presents with tremors.  Patient has recently been off Keppra  due to a dental procedure 2 days prior to arrival.  Not long after arrival patient began seizing which was unresponsive to medication.  Patient was transferred to stepdown unit for status epilepticus. He was found to have E. coli bacteremia.  Workup was unrevealing for possible source.  CT abdomen pelvis had no acute findings and blood culture did not reveal E. coli.  Infectious disease was consulted who recommended 10 days of Augmentin  to complete on 6/12. Seizure was thought to be secondary to medication noncompliance complicated by acute infection. Patient will be discharging today to skilled nursing facility.  Breakthrough seizure - Secondary to medication non adherence and acute infection -Neurology consulted agrees with current dose lamotrigine  and keppra   E. coli Bacteremia - Source unclear.  Possibly urine.  Culture with insignificant growth.  CT abdomen pelvis with no acute findings - Hardware to right hip - Bladder scan negative - Infectious disease consulted - 10-day antibiotic treatment recommended.  Complete amoxicillin  through 6/12  Hypokalemia - Resolved  History of CVA - Continue statin and aspirin   Paroxysmal A-fib - Resume home meds  Hypertension - Continue home meds  Hyperlipidemia - Continue statin  Chronic diastolic CHF - Echo 06/2022: EF 60 to 65%, grade 1 diastolic dysfunction.  Clinically euvolemic  BPH - Resume Flomax   Alcohol abuse - Last drink 2 weeks prior to arrival.  Normocytic anemia - Hemoglobin stable  Debility - Physical therapy has advised SNF, family in agreement - Discharge to SNF today

## 2023-08-04 NOTE — Progress Notes (Signed)
 Patient awake, appears angry refusing to eat, refusing 0800 meds  and get up to chair at this time. Patient verbalized he was angry, did not state why when asked. Stated, it's not you  but did not further verbalize his thoughts or concerns. Will attempt to talk further when patient allows and attempt to assist with meds and ADLS as patient allows.

## 2023-08-04 NOTE — Progress Notes (Signed)
 Patient ready for DC. Report called into [facility], report given to Byars at 1225. All questions answered. Mid line removed. Patient at 80% of lunch and was assisted to bathroom and voided prior to pick. All belongings collected.

## 2023-08-26 ENCOUNTER — Other Ambulatory Visit: Payer: Self-pay

## 2023-08-26 ENCOUNTER — Observation Stay

## 2023-08-26 ENCOUNTER — Inpatient Hospital Stay

## 2023-08-26 ENCOUNTER — Encounter: Payer: Self-pay | Admitting: Emergency Medicine

## 2023-08-26 ENCOUNTER — Inpatient Hospital Stay
Admission: EM | Admit: 2023-08-26 | Discharge: 2023-08-31 | DRG: 101 | Disposition: A | Discharge status: Skilled Nursing Facility | Attending: Internal Medicine | Admitting: Internal Medicine

## 2023-08-26 ENCOUNTER — Emergency Department

## 2023-08-26 DIAGNOSIS — Z7982 Long term (current) use of aspirin: Secondary | ICD-10-CM

## 2023-08-26 DIAGNOSIS — N400 Enlarged prostate without lower urinary tract symptoms: Secondary | ICD-10-CM | POA: Diagnosis present

## 2023-08-26 DIAGNOSIS — E785 Hyperlipidemia, unspecified: Secondary | ICD-10-CM | POA: Diagnosis present

## 2023-08-26 DIAGNOSIS — G40909 Epilepsy, unspecified, not intractable, without status epilepticus: Secondary | ICD-10-CM | POA: Diagnosis present

## 2023-08-26 DIAGNOSIS — I5032 Chronic diastolic (congestive) heart failure: Secondary | ICD-10-CM | POA: Diagnosis present

## 2023-08-26 DIAGNOSIS — I480 Paroxysmal atrial fibrillation: Secondary | ICD-10-CM | POA: Diagnosis present

## 2023-08-26 DIAGNOSIS — G40919 Epilepsy, unspecified, intractable, without status epilepticus: Secondary | ICD-10-CM | POA: Diagnosis present

## 2023-08-26 DIAGNOSIS — I69351 Hemiplegia and hemiparesis following cerebral infarction affecting right dominant side: Secondary | ICD-10-CM

## 2023-08-26 DIAGNOSIS — I110 Hypertensive heart disease with heart failure: Secondary | ICD-10-CM | POA: Diagnosis present

## 2023-08-26 DIAGNOSIS — Z7901 Long term (current) use of anticoagulants: Secondary | ICD-10-CM

## 2023-08-26 DIAGNOSIS — R569 Unspecified convulsions: Principal | ICD-10-CM

## 2023-08-26 DIAGNOSIS — R791 Abnormal coagulation profile: Secondary | ICD-10-CM | POA: Diagnosis present

## 2023-08-26 DIAGNOSIS — D649 Anemia, unspecified: Secondary | ICD-10-CM | POA: Diagnosis present

## 2023-08-26 DIAGNOSIS — R32 Unspecified urinary incontinence: Secondary | ICD-10-CM | POA: Diagnosis present

## 2023-08-26 DIAGNOSIS — Z79899 Other long term (current) drug therapy: Secondary | ICD-10-CM | POA: Diagnosis not present

## 2023-08-26 DIAGNOSIS — F1010 Alcohol abuse, uncomplicated: Secondary | ICD-10-CM | POA: Diagnosis present

## 2023-08-26 DIAGNOSIS — R4182 Altered mental status, unspecified: Secondary | ICD-10-CM

## 2023-08-26 DIAGNOSIS — Z87891 Personal history of nicotine dependence: Secondary | ICD-10-CM | POA: Diagnosis not present

## 2023-08-26 DIAGNOSIS — I69398 Other sequelae of cerebral infarction: Principal | ICD-10-CM

## 2023-08-26 DIAGNOSIS — Z8249 Family history of ischemic heart disease and other diseases of the circulatory system: Secondary | ICD-10-CM

## 2023-08-26 LAB — URINALYSIS, COMPLETE (UACMP) WITH MICROSCOPIC
Bacteria, UA: NONE SEEN
Bilirubin Urine: NEGATIVE
Glucose, UA: NEGATIVE mg/dL
Hgb urine dipstick: NEGATIVE
Ketones, ur: NEGATIVE mg/dL
Leukocytes,Ua: NEGATIVE
Nitrite: NEGATIVE
Protein, ur: NEGATIVE mg/dL
Specific Gravity, Urine: 1.025 (ref 1.005–1.030)
pH: 6 (ref 5.0–8.0)

## 2023-08-26 LAB — COMPREHENSIVE METABOLIC PANEL WITH GFR
ALT: 13 U/L (ref 0–44)
ALT: 12 U/L (ref 0–44)
AST: 30 U/L (ref 15–41)
AST: 23 U/L (ref 15–41)
Albumin: 3.2 g/dL — ABNORMAL LOW (ref 3.5–5.0)
Albumin: 3.4 g/dL — ABNORMAL LOW (ref 3.5–5.0)
Alkaline Phosphatase: 81 U/L (ref 38–126)
Alkaline Phosphatase: 81 U/L (ref 38–126)
Anion gap: 7 (ref 5–15)
Anion gap: 11 (ref 5–15)
BUN: 22 mg/dL (ref 8–23)
BUN: 19 mg/dL (ref 8–23)
CO2: 24 mmol/L (ref 22–32)
CO2: 25 mmol/L (ref 22–32)
Calcium: 8.9 mg/dL (ref 8.9–10.3)
Calcium: 9.1 mg/dL (ref 8.9–10.3)
Chloride: 107 mmol/L (ref 98–111)
Chloride: 103 mmol/L (ref 98–111)
Creatinine, Ser: 1.15 mg/dL (ref 0.61–1.24)
Creatinine, Ser: 1.05 mg/dL (ref 0.61–1.24)
GFR, Estimated: >60 mL/min (ref >60)
GFR, Estimated: >60 mL/min (ref >60)
Glucose, Bld: 129 mg/dL — ABNORMAL HIGH (ref 70–99)
Glucose, Bld: 159 mg/dL — ABNORMAL HIGH (ref 70–99)
Potassium: 3.8 mmol/L (ref 3.5–5.1)
Potassium: 4.6 mmol/L (ref 3.5–5.1)
Sodium: 138 mmol/L (ref 135–145)
Sodium: 139 mmol/L (ref 135–145)
Total Bilirubin: 0.6 mg/dL (ref 0.0–1.2)
Total Bilirubin: 0.7 mg/dL (ref 0.0–1.2)
Total Protein: 6.1 g/dL — ABNORMAL LOW (ref 6.5–8.1)
Total Protein: 6.5 g/dL (ref 6.5–8.1)

## 2023-08-26 LAB — CBC WITH DIFFERENTIAL/PLATELET
Abs Immature Granulocytes: 0.02 10*3/uL (ref 0.00–0.07)
Basophils Absolute: 0 10*3/uL (ref 0.0–0.1)
Basophils Relative: 1 %
Eosinophils Absolute: 0.2 10*3/uL (ref 0.0–0.5)
Eosinophils Relative: 3 %
HCT: 28.8 % — ABNORMAL LOW (ref 39.0–52.0)
Hemoglobin: 9.4 g/dL — ABNORMAL LOW (ref 13.0–17.0)
Immature Granulocytes: 0 %
Lymphocytes Relative: 20 %
Lymphs Abs: 1.1 10*3/uL (ref 0.7–4.0)
MCH: 28.6 pg (ref 26.0–34.0)
MCHC: 32.6 g/dL (ref 30.0–36.0)
MCV: 87.5 fL (ref 80.0–100.0)
Monocytes Absolute: 0.7 10*3/uL (ref 0.1–1.0)
Monocytes Relative: 13 %
Neutro Abs: 3.6 10*3/uL (ref 1.7–7.7)
Neutrophils Relative %: 63 %
Platelets: 164 10*3/uL (ref 150–400)
RBC: 3.29 MIL/uL — ABNORMAL LOW (ref 4.22–5.81)
RDW: 15.7 % — ABNORMAL HIGH (ref 11.5–15.5)
WBC: 5.7 10*3/uL (ref 4.0–10.5)
nRBC: 0 % (ref 0.0–0.2)

## 2023-08-26 LAB — MAGNESIUM
Magnesium: 1.6 mg/dL — ABNORMAL LOW (ref 1.7–2.4)
Magnesium: 2.1 mg/dL (ref 1.7–2.4)

## 2023-08-26 LAB — PROTIME-INR
INR: 2.7 — ABNORMAL HIGH (ref 0.8–1.2)
INR: 1.8 — ABNORMAL HIGH (ref 0.8–1.2)
Prothrombin Time: 29.8 s — ABNORMAL HIGH (ref 11.4–15.2)
Prothrombin Time: 21.6 s — ABNORMAL HIGH (ref 11.4–15.2)

## 2023-08-26 LAB — URINE DRUG SCREEN, QUALITATIVE (ARMC ONLY)
Amphetamines, Ur Screen: NOT DETECTED
Barbiturates, Ur Screen: NOT DETECTED
Benzodiazepine, Ur Scrn: POSITIVE — AB
Cannabinoid 50 Ng, Ur ~~LOC~~: NOT DETECTED
Cocaine Metabolite,Ur ~~LOC~~: NOT DETECTED
MDMA (Ecstasy)Ur Screen: NOT DETECTED
Methadone Scn, Ur: NOT DETECTED
Opiate, Ur Screen: NOT DETECTED
Phencyclidine (PCP) Ur S: NOT DETECTED
Tricyclic, Ur Screen: NOT DETECTED

## 2023-08-26 LAB — ETHANOL: Alcohol, Ethyl (B): <15 mg/dL (ref <15)

## 2023-08-26 LAB — APTT: aPTT: 42 s — ABNORMAL HIGH (ref 24–36)

## 2023-08-26 LAB — PHOSPHORUS: Phosphorus: 2.6 mg/dL (ref 2.5–4.6)

## 2023-08-26 LAB — CBG MONITORING, ED: Glucose-Capillary: 129 mg/dL — ABNORMAL HIGH (ref 70–99)

## 2023-08-26 LAB — CK: Total CK: 58 U/L (ref 49–397)

## 2023-08-26 MED ORDER — SODIUM CHLORIDE 0.9 % IV SOLN
200.0000 mg | Freq: Once | INTRAVENOUS | Status: AC
  Administered 2023-08-26: 200 mg via INTRAVENOUS
  Filled 2023-08-26: qty 20

## 2023-08-26 MED ORDER — RIVAROXABAN 20 MG PO TABS
20.0000 mg | ORAL_TABLET | Freq: Every day | ORAL | Status: DC
  Administered 2023-08-26 – 2023-08-30 (×5): 20 mg via ORAL
  Filled 2023-08-26 (×6): qty 1

## 2023-08-26 MED ORDER — MAGNESIUM SULFATE 2 GM/50ML IV SOLN
2.0000 g | Freq: Once | INTRAVENOUS | Status: AC
  Administered 2023-08-26: 2 g via INTRAVENOUS
  Filled 2023-08-26: qty 50

## 2023-08-26 MED ORDER — LORAZEPAM 2 MG/ML IJ SOLN
1.0000 mg | INTRAMUSCULAR | Status: DC | PRN
  Administered 2023-08-26: 1 mg via INTRAVENOUS
  Filled 2023-08-26: qty 1

## 2023-08-26 MED ORDER — LORAZEPAM 2 MG/ML IJ SOLN
INTRAMUSCULAR | Status: AC
  Administered 2023-08-26: 2 mg via INTRAVENOUS
  Filled 2023-08-26: qty 1

## 2023-08-26 MED ORDER — LEVETIRACETAM 500 MG PO TABS
1500.0000 mg | ORAL_TABLET | Freq: Two times a day (BID) | ORAL | Status: DC
  Administered 2023-08-26 – 2023-08-31 (×10): 1500 mg via ORAL
  Filled 2023-08-26 (×10): qty 3

## 2023-08-26 MED ORDER — ADULT MULTIVITAMIN W/MINERALS CH
1.0000 | ORAL_TABLET | Freq: Every day | ORAL | Status: DC
  Administered 2023-08-26 – 2023-08-31 (×6): 1 via ORAL
  Filled 2023-08-26 (×6): qty 1

## 2023-08-26 MED ORDER — VITAMIN B-12 1000 MCG PO TABS
1000.0000 ug | ORAL_TABLET | Freq: Every day | ORAL | Status: DC
  Administered 2023-08-26 – 2023-08-31 (×6): 1000 ug via ORAL
  Filled 2023-08-26 (×3): qty 1
  Filled 2023-08-26: qty 2
  Filled 2023-08-26 (×2): qty 1

## 2023-08-26 MED ORDER — LORAZEPAM 2 MG/ML IJ SOLN
2.0000 mg | Freq: Once | INTRAMUSCULAR | Status: AC
  Administered 2023-08-26: 2 mg via INTRAVENOUS
  Filled 2023-08-26: qty 1

## 2023-08-26 MED ORDER — TAMSULOSIN HCL 0.4 MG PO CAPS
0.4000 mg | ORAL_CAPSULE | Freq: Every day | ORAL | Status: DC
  Administered 2023-08-26 – 2023-08-30 (×5): 0.4 mg via ORAL
  Filled 2023-08-26 (×5): qty 1

## 2023-08-26 MED ORDER — THIAMINE MONONITRATE 100 MG PO TABS
100.0000 mg | ORAL_TABLET | Freq: Every day | ORAL | Status: DC
  Administered 2023-08-26 – 2023-08-31 (×6): 100 mg via ORAL
  Filled 2023-08-26 (×6): qty 1

## 2023-08-26 MED ORDER — ATORVASTATIN CALCIUM 20 MG PO TABS
40.0000 mg | ORAL_TABLET | Freq: Every day | ORAL | Status: DC
  Administered 2023-08-26 – 2023-08-30 (×5): 40 mg via ORAL
  Filled 2023-08-26 (×3): qty 2
  Filled 2023-08-26: qty 4
  Filled 2023-08-26: qty 2

## 2023-08-26 MED ORDER — CARVEDILOL 12.5 MG PO TABS
12.5000 mg | ORAL_TABLET | Freq: Two times a day (BID) | ORAL | Status: DC
  Administered 2023-08-26 – 2023-08-31 (×10): 12.5 mg via ORAL
  Filled 2023-08-26 (×3): qty 1
  Filled 2023-08-26: qty 2
  Filled 2023-08-26 (×7): qty 1

## 2023-08-26 MED ORDER — LEVETIRACETAM (KEPPRA) 500 MG/5 ML ADULT IV PUSH
1000.0000 mg | INTRAVENOUS | Status: AC
  Administered 2023-08-26: 1000 mg via INTRAVENOUS
  Filled 2023-08-26: qty 10

## 2023-08-26 MED ORDER — LACOSAMIDE 50 MG PO TABS
150.0000 mg | ORAL_TABLET | Freq: Two times a day (BID) | ORAL | Status: DC
  Administered 2023-08-26 – 2023-08-27 (×3): 150 mg via ORAL
  Filled 2023-08-26 (×3): qty 3

## 2023-08-26 MED ORDER — LEVETIRACETAM (KEPPRA) 500 MG/5 ML ADULT IV PUSH: 60.0000 mg/kg | Freq: Once | INTRAVENOUS | Status: DC

## 2023-08-26 MED ORDER — LEVETIRACETAM 500 MG PO TABS
500.0000 mg | ORAL_TABLET | Freq: Once | ORAL | Status: AC
  Administered 2023-08-26: 500 mg via ORAL
  Filled 2023-08-26: qty 1

## 2023-08-26 MED ORDER — FOLIC ACID 1 MG PO TABS
1.0000 mg | ORAL_TABLET | Freq: Every day | ORAL | Status: DC
  Administered 2023-08-26 – 2023-08-31 (×6): 1 mg via ORAL
  Filled 2023-08-26 (×6): qty 1

## 2023-08-26 MED ORDER — AMLODIPINE BESYLATE 10 MG PO TABS
10.0000 mg | ORAL_TABLET | Freq: Every day | ORAL | Status: DC
  Administered 2023-08-26 – 2023-08-31 (×6): 10 mg via ORAL
  Filled 2023-08-26: qty 2
  Filled 2023-08-26 (×5): qty 1

## 2023-08-26 MED ORDER — IOHEXOL 350 MG/ML SOLN
125.0000 mL | Freq: Once | INTRAVENOUS | Status: AC | PRN
  Administered 2023-08-26: 125 mL via INTRAVENOUS

## 2023-08-26 MED ORDER — PHENOBARBITAL SODIUM 65 MG/ML IJ SOLN
65.0000 mg | Freq: Once | INTRAMUSCULAR | Status: AC
  Administered 2023-08-26: 65 mg via INTRAVENOUS
  Filled 2023-08-26: qty 1

## 2023-08-26 MED ORDER — ASPIRIN 81 MG PO TBEC
81.0000 mg | DELAYED_RELEASE_TABLET | Freq: Every day | ORAL | Status: DC
  Administered 2023-08-26 – 2023-08-31 (×6): 81 mg via ORAL
  Filled 2023-08-26 (×6): qty 1

## 2023-08-26 MED ORDER — LEVETIRACETAM (KEPPRA) 500 MG/5 ML ADULT IV PUSH
3000.0000 mg | Freq: Once | INTRAVENOUS | Status: AC
  Administered 2023-08-26: 3000 mg via INTRAVENOUS
  Filled 2023-08-26: qty 30

## 2023-08-26 MED ORDER — LORAZEPAM 2 MG/ML IJ SOLN: 2.0000 mg | Freq: Once | INTRAMUSCULAR | Status: AC

## 2023-08-26 NOTE — ED Notes (Signed)
 Pt continues to have episodes of seizure like activity, however remains CAO through episode and able to talk to me

## 2023-08-26 NOTE — ED Notes (Signed)
 Pt CAOx4 at this time and able to take his meds without issue.

## 2023-08-26 NOTE — ED Provider Notes (Signed)
 [facility] Provider Note    Event Date/Time   First MD Initiated Contact with Patient 08/26/23 0240     (approximate)   History   Seizures  Level 5 caveat:  history/ROS limited by acute/critical illness  HPI John Spears is a 73 y.o. male with a very complicated past medical history including prior CVA and prior seizure disorder as well as a prior history of alcohol use.  He presents tonight by EMS for seizures.  Very little information is available except that he had seizure-like activity at home which is consistent with prior episodes that he has had according to his family member (sister).  He has been hospitalized for similar in the recent past.  Reportedly had at least 20 minutes of facial twitching and arm twitching but he would continue to talk.  Allegedly he was drinking alcohol but the patient denies this.  He has had issues with alcohol abuse in the past but had stopped for a long time.  It is unclear if he has been taking his seizure medicine.  He received Versed  2.5 mg twice by EMS and still occasionally has some twitching.  He has had a significant stroke in the past but is unclear if he has any deficits.     Physical Exam   Triage Vital Signs: ED Triage Vitals  Encounter Vitals Group     BP 08/26/23 0241 (!) 111/56     Girls Systolic BP Percentile --      Girls Diastolic BP Percentile --      Boys Systolic BP Percentile --      Boys Diastolic BP Percentile --      Pulse Rate 08/26/23 0241 64     Resp 08/26/23 0241 20     Temp 08/26/23 0241 99.2 F (37.3 C)     Temp Source 08/26/23 0241 Axillary     SpO2 08/26/23 0241 98 %     Weight 08/26/23 0239 74.9 kg (165 lb 3.2 oz)     Height 08/26/23 0239 1.803 m (5' 11)     Head Circumference --      Peak Flow --      Pain Score --      Pain Loc --      Pain Education --      Exclude from Growth Chart --     Most recent vital signs: Vitals:   08/26/23 0625 08/26/23 0630  BP:   137/66  Pulse: 63 64  Resp: 18 20  Temp:    SpO2: 98% 98%    General: Awake, alert.  Appears chronically ill but nontoxic.  Protecting his airway. CV:  Good peripheral perfusion.  Regular rate and rhythm, normal heart sounds. Resp:  Normal effort. no accessory muscle usage nor intercostal retractions.  Lungs clear to auscultation bilaterally. Abd:  No distention.  No tenderness to palpation. Other:  Difficult to get a good neuroexam.  Initially patient had no obvious facial droop, then seemed develop some right sided facial droop, started smiling, and then had twitching in the left sided gaze deviation and shaking of his hands and arms.  However, when I said his name and talk to him he would respond and talk back.  When I administered a sternal rub and told him to stop, he immediately stopped the twitching.  However it started again shortly thereafter.  He will follow some commands and has good bilateral grip strength but will not hold his arms up or his  legs up in the air but is unclear if this is voluntary or beyond his control.   ED Results / Procedures / Treatments   Labs (all labs ordered are listed, but only abnormal results are displayed) Labs Reviewed  COMPREHENSIVE METABOLIC PANEL WITH GFR - Abnormal; Notable for the following components:      Result Value   Glucose, Bld 129 (*)    Total Protein 6.1 (*)    Albumin 3.2 (*)    All other components within normal limits  CBC WITH DIFFERENTIAL/PLATELET - Abnormal; Notable for the following components:   RBC 3.29 (*)    Hemoglobin 9.4 (*)    HCT 28.8 (*)    RDW 15.7 (*)    All other components within normal limits  MAGNESIUM  - Abnormal; Notable for the following components:   Magnesium  1.6 (*)    All other components within normal limits  URINE DRUG SCREEN, QUALITATIVE ([facility] ONLY) - Abnormal; Notable for the following components:   Benzodiazepine, Ur Scrn POSITIVE (*)    All other components within normal limits  PROTIME-INR -  Abnormal; Notable for the following components:   Prothrombin Time 29.8 (*)    INR 2.7 (*)    All other components within normal limits  APTT - Abnormal; Notable for the following components:   aPTT 42 (*)    All other components within normal limits  CBG MONITORING, ED - Abnormal; Notable for the following components:   Glucose-Capillary 129 (*)    All other components within normal limits  PHOSPHORUS  ETHANOL  LEVETIRACETAM  LEVEL  COMPREHENSIVE METABOLIC PANEL WITH GFR  MAGNESIUM   PROTIME-INR  CK  URINALYSIS, COMPLETE (UACMP) WITH MICROSCOPIC     EKG  ED ECG REPORT I, Darleene Dome, the attending physician, personally viewed and interpreted this ECG.  Date: 08/26/2023 EKG Time: 2:43 AM Rate: 64 Rhythm: And first-degree AV blocksinus rhythm with PVC QRS Axis: normal Intervals: Right bundle branch block and left anterior fascicular block.  PR interval of 252 ms ST/T Wave abnormalities: Non-specific ST segment / T-wave changes, but no clear evidence of acute ischemia. Narrative Interpretation: no definitive evidence of acute ischemia; does not meet STEMI criteria.    RADIOLOGY See ED course for details   PROCEDURES:  Critical Care performed: Yes, see critical care procedure note(s)  .Critical Care  Performed by: Dome Darleene, MD Authorized by: Dome Darleene, MD   Critical care provider statement:    Critical care time (minutes):  45   Critical care time was exclusive of:  Separately billable procedures and treating other patients   Critical care was necessary to treat or prevent imminent or life-threatening deterioration of the following conditions:  CNS failure or compromise   Critical care was time spent personally by me on the following activities:  Development of treatment plan with patient or surrogate, evaluation of patient's response to treatment, examination of patient, obtaining history from patient or surrogate, ordering and performing treatments and  interventions, ordering and review of laboratory studies, ordering and review of radiographic studies, pulse oximetry, re-evaluation of patient's condition and review of old charts .1-3 Lead EKG Interpretation  Performed by: Dome Darleene, MD Authorized by: Dome Darleene, MD     Interpretation: normal     ECG rate:  80   ECG rate assessment: normal     Rhythm: sinus rhythm     Ectopy: none     Conduction: normal       IMPRESSION / MDM / ASSESSMENT AND PLAN /  ED COURSE  I reviewed the triage vital signs and the nursing notes.                              Differential diagnosis includes, but is not limited to, seizure, pseudoseizure, CVA, alcohol withdrawal, medication noncompliance, electrolyte or metabolic abnormality, acute infection.  Patient's presentation is most consistent with acute presentation with potential threat to life or bodily function.  Labs/studies ordered: CTA head and neck, CT head, 1 view chest x-ray, CBC with differential, magnesium  level, phosphorus level, Keppra  level, pro time-INR, urine drug screen, magnesium  level, ethanol level  Interventions/Medications given:  Medications  LORazepam  (ATIVAN ) injection 1 mg (has no administration in time range)  amLODipine  (NORVASC ) tablet 10 mg (has no administration in time range)  aspirin  EC tablet 81 mg (has no administration in time range)  atorvastatin  (LIPITOR) tablet 40 mg (has no administration in time range)  carvedilol  (COREG ) tablet 12.5 mg (has no administration in time range)  cyanocobalamin  (VITAMIN B12) tablet 1,000 mcg (has no administration in time range)  folic acid  (FOLVITE ) tablet 1 mg (has no administration in time range)  multivitamin with minerals tablet 1 tablet (has no administration in time range)  thiamine  (VITAMIN B1) tablet 100 mg (has no administration in time range)  lacosamide  (VIMPAT ) tablet 150 mg (has no administration in time range)  levETIRAcetam  (KEPPRA ) tablet 1,500 mg (has no  administration in time range)  levETIRAcetam  (KEPPRA ) tablet 500 mg (has no administration in time range)  rivaroxaban  (XARELTO ) tablet 20 mg (has no administration in time range)  tamsulosin  (FLOMAX ) capsule 0.4 mg (has no administration in time range)  levETIRAcetam  (KEPPRA ) undiluted injection 1,000 mg (1,000 mg Intravenous Given 08/26/23 0321)  magnesium  sulfate IVPB 2 g 50 mL (0 g Intravenous Stopped 08/26/23 0614)  LORazepam  (ATIVAN ) injection 2 mg (2 mg Intravenous Given 08/26/23 0429)  iohexol  (OMNIPAQUE ) 350 MG/ML injection 125 mL (125 mLs Intravenous Contrast Given 08/26/23 0458)  PHENObarbital  (LUMINAL) injection 65 mg (65 mg Intravenous Given 08/26/23 0601)    (Note:  hospital course my include additional interventions and/or labs/studies not listed above.)   Very confusing and contradictory presentation.  I reviewed the patient's record Including prior ED notes and the discharge summary written by Dr. Leesa on 08/04/2023 during a hospital admission for very similar issues.  He had evaluation by neurology who recommended continuing Keppra  and lamotrigine  and the patient had an EEG which showed no epileptic activity.  Thought is that medication noncompliance and possible alcohol use and/or withdrawal contributed to the patient's symptoms.  I considered activating code stroke but the patient is outside the window, plus we obtained collateral information from the patient's sister who said that his last known normal was about 7 to 8 hours prior to arrival and that the patient takes Xarelto  for anticoagulation, excluding him from [facility].  She also mentioned that when he has episodes like this, he usually needs placement in a rehab/SNF facility.  This is a fairly common occurrence for him, it seems, and I think it is very unlikely he is having an acute CVA.  I ordered CT head and CTA head and neck to evaluate for the possibility of LVO which I think is unlikely as well as to rule out acute bleed.  I  initially tried to get a CT perfusion study but we were unable to establish adequate peripheral access, but I think it is more important to rule out the  LVO with a CTA head and neck.  The patient received Versed  prior to arrival and when he continued having intermittent episodes I ordered Ativan  2 mg IV to see if this would help.  Is unclear if it is having a therapeutic effect.  The patient remains coherent and answers questions although his speech is slurred.  Labs are essentially normal with no clear indication of an emergent condition although his alcohol level is still pending.  The patient is on the cardiac monitor to evaluate for evidence of arrhythmia and/or significant heart rate changes.   Clinical Course as of 08/26/23 0759  Thu Aug 26, 2023  0348 Negative alcohol level but unknown if he has been drinking recently.  CMP is more or less normal.  CBC stable.  Magnesium  a bit low at 1.6 and I ordered 2 g of IV magnesium  for repletion.  Coagulation studies pending.  Imaging studies are pending. [CF]  0555 I independently viewed and interpreted the patient's head CT as well as his CTA head and neck.  I see no obvious intracranial hemorrhage.  He also has no evidence of an LVO.  This was confirmed by radiology.  Patient still having occasional twitching episodes which seem redirectable.  I do not believe he is having status epilepticus but I also do not know when he last had alcohol (he is negative tonight).  I again reviewed his medical record including his last discharge summary and this seems very similar to his prior presentation.  I ordered a low-dose [CF]  9441 I am consulting the hospitalist team for admission.   [CF]  0559 His labs are reassuring and there does not seem to be another of phenobarbital  to see if this would also help with the symptoms and I will admit the patient to the hospital for further management.  Urgent or emergent condition that requires intervention. [CF]  0608 I  consulted by phone with the admitting hospitalist, and they will admit the patient - Dr. Cleatus. [CF]    Clinical Course User Index [CF] Gordan Huxley, MD     FINAL CLINICAL IMPRESSION(S) / ED DIAGNOSES   Final diagnoses:  Seizure-like activity (HCC)  Altered mental status, unspecified altered mental status type     Rx / DC Orders   ED Discharge Orders     None        Note:  This document was prepared using Dragon voice recognition software and may include unintentional dictation errors.   Gordan Huxley, MD 08/26/23 [phone]

## 2023-08-26 NOTE — ED Notes (Addendum)
 Pt is experiencing uncontrollable muscle jerking. Same resembles seizure activity, however pt is CAO and able to talk/communicate during episodes. ED physician, Dr. Dicky, brought in to observe pt during episode. Per Dr. Dicky, PRN ativan  given for jerking activity, however not a seizure.

## 2023-08-26 NOTE — H&P (Signed)
 History and Physical    Patient: John Spears FMW:[id number] DOB: 02/16/1951 DOA: 08/26/2023 DOS: the patient was seen and examined on 08/26/2023 PCP: Patient, No Pcp Per     Chief Complaint:  Chief Complaint  Patient presents with   Seizures   HPI: John Spears is a 73 y.o. male with medical history significant of seizure disorder who presents for seizure. History obtained from the pt's sister John Spears as the pt is a poor historian. Sister reports around 2 am she kept hearing a bumping sound coming from the pt's room (she heard a this bumping sound for approx 15 min straight). She went in to check on him and he was found in the chair and his whole body was stiff and shaking. He did urinate in the chair. Sister called 911. Sister also reports that she found an open container of beer in the room with the pt but she did not actually see him drink the beer. She has told him several times not to drink alcohol while taking his seizure med's. The sister is the one administer's the pt's medications (including seizure medications so there seems to be good compliance with medications).  In the ER the pt is alert and oriented x 3 (although it took him a few tries to get all the questions correct). He will be admitted to the medicine service.   Review of Systems: Pt is not currently able to participate in a meaningful ROS due to drowsiness  Past Medical History:  Diagnosis Date   Alcohol abuse    Heart failure with improved ejection fraction (HFimpEF) (HCC) 02/2022   a. 02/2019 Echo: EF 60-65%, GrI DD; b. 01/2022 Echo: EF 30-35%; c. 06/2022 Echo: EF 60-65%, mild LVH, RI DD, nl RV fxn, triv MR.   Hypertension    Seizure (HCC)    Seizure disorder [facility])    Past Surgical History:  Procedure Laterality Date   JOINT REPLACEMENT     Social History:  reports that he has quit smoking. He has never used smokeless tobacco. He reports that he does not currently use alcohol after a past usage of about 84.0  standard drinks of alcohol per week. He reports that he does not currently use drugs after having used the following drugs: Marijuana.  Not on File  Family History  Problem Relation Age of Onset   Heart attack Mother 63    Prior to Admission medications   Medication Sig Start Date End Date Taking? Authorizing Provider  acetaminophen  (TYLENOL ) 500 MG tablet Take 1 tablet (500 mg total) by mouth every 6 (six) hours as needed for moderate pain (pain score 4-6). 08/04/23  Yes Dezii, Alexandra, DO  amLODipine  (NORVASC ) 10 MG tablet Take 10 mg by mouth at bedtime. 01/13/23  Yes [provider]  ASPIRIN  LOW DOSE 81 MG tablet Take 81 mg by mouth daily. 07/05/23  Yes [provider]  atorvastatin  (LIPITOR) 40 MG tablet Take 40 mg by mouth at bedtime.   Yes [provider]  carvedilol  (COREG ) 12.5 MG tablet Take 1 tablet (12.5 mg total) by mouth 2 (two) times daily. 07/05/23 07/04/24 Yes Hammock, Tylene, NP  cyanocobalamin  (VITAMIN B12) 1000 MCG tablet Take 1,000 mcg by mouth in the morning. 11/24/22  Yes [provider]  folic acid  (FOLVITE ) 1 MG tablet Take 1 mg by mouth in the morning. 07/16/22  Yes [provider]  furosemide  (LASIX ) 20 MG tablet TAKE 1 TABLET BY MOUTH ONCE A DAY 11/09/22  Yes  Vivienne Lonni Ingle, NP  lacosamide  150 MG TABS Take 1 tablet (150 mg total) by mouth 2 (two) times daily. 03/16/23  Yes Raenelle Coria, MD  levETIRAcetam  (KEPPRA ) 750 MG tablet Take 2 tablets (1,500 mg total) by mouth 2 (two) times daily. 03/11/23 08/26/23 Yes Ghimire, Kuber, MD  Multiple Vitamin (MULTIVITAMIN WITH MINERALS) TABS tablet Take 1 tablet by mouth daily. Patient taking differently: Take 1 tablet by mouth every evening. 12/31/22  Yes Briana Elgin LABOR, MD  rivaroxaban  (XARELTO ) 20 MG TABS tablet Take 1 tablet (20 mg total) by mouth daily with supper. 05/17/23  Yes Hammock, Sheri, NP  tamsulosin  (FLOMAX ) 0.4 MG CAPS capsule Take 0.4 mg by mouth at bedtime.   Yes  [provider]  thiamine  (VITAMIN B1) 100 MG tablet Take 100 mg by mouth in the morning. 07/16/22  Yes [provider]  feeding supplement (ENSURE ENLIVE / ENSURE PLUS) LIQD Take 237 mLs by mouth 2 (two) times daily between meals. 12/30/22   Briana Elgin LABOR, MD  gabapentin  (NEURONTIN ) 100 MG capsule Take 100 mg by mouth 2 (two) times daily. Patient not taking: Reported on 08/26/2023    [provider]    Physical Exam: Vitals:   08/26/23 0558 08/26/23 0605 08/26/23 0625 08/26/23 0630  BP:  (!) 146/82  137/66  Pulse:  77 63 64  Resp:   18 20  Temp: 98.9 F (37.2 C)     TempSrc: Oral     SpO2:  100% 98% 98%  Weight:      Height:       Physical Exam Constitutional:      Comments: Drowsy  HENT:     Head: Normocephalic.     Mouth/Throat:     Mouth: Mucous membranes are moist.  Cardiovascular:     Rate and Rhythm: Normal rate.  Pulmonary:     Effort: Pulmonary effort is normal.  Abdominal:     Palpations: Abdomen is soft.  Skin:    General: Skin is warm.  Neurological:     Mental Status: He is alert.     Comments: Drowsy but alert  Psychiatric:     Comments: Drowsy but alert     Assessment and Plan: Seizure  - Neurology consulted 1020AM 08/26/2023 & Dr. Matthews will evaluate the patient - Vimpat  150 mg PO bid  - Keppra  1500 mg PO bid  - Brain MRI now  - PRN ativan   - Fall/seizure/aspiration precautions  - Cardiac monitoring  - Neurochecks q4 - PT/OT   Hypomagnesemia - Received 2 g IV mag in the ER   Elevated INR  - Repeat INR now   4. HTN - Norvasc  10 mg PO daily  - Coreg  12.5 mg PO bid   5. Hx of CVA  - ASA 81 mg PO daily  - Lipitor 40 mg PO daily   6. PAF  - Xarelto  20 mg PO daily   7. Alcohol use - Folic acid  1 mg PO daily  - MVI PO daily  - Thiamine  100 mg PO daily   8. BPH  - Flomax  0.4 mg PO daily   Family Communication: Spoke with pt's sister on the phone   Severity of Illness: The appropriate patient status for  this patient is INPATIENT. Inpatient status is judged to be reasonable and necessary in order to provide the required intensity of service to ensure the patient's safety. The patient's presenting symptoms, physical exam findings, and initial radiographic and laboratory data in the context  of their chronic comorbidities is felt to place them at high risk for further clinical deterioration. Furthermore, it is not anticipated that the patient will be medically stable for discharge from the hospital within 2 midnights of admission.   * I certify that at the point of admission it is my clinical judgment that the patient will require inpatient hospital care spanning beyond 2 midnights from the point of admission due to high intensity of service, high risk for further deterioration and high frequency of surveillance required.*  Author: Maxximus Gotay , MD 08/26/2023 7:34 AM  For on call review [email or web address].

## 2023-08-26 NOTE — TOC Initial Note (Signed)
 Transition of Care [facility]) - Initial/Assessment Note    Patient Details  Name: John Spears MRN: [id number] Date of Birth: [birth year]  Transition of Care [facility]) CM/SW Contact:    Dalia GORMAN Fuse, RN Phone Number: 08/26/2023, 11:22 AM  Clinical Narrative:                 TOC visited the patient in the room and explained the roll. TOC observed that the patient was actively twitching, but ED paramedic gave [facility] permission to proceed with the screening. The patient advised that he is from home with his sister. He advised he is independent in his care and doesn't require assistance. His sister drives him to and from appointments. He doesn't have a PCP noted. TOC will continue to follow.  Expected Discharge Plan: Skilled Nursing Facility Barriers to Discharge: Continued Medical Work up   Patient Goals and CMS Choice            Expected Discharge Plan and Services   Discharge Planning Services: CM Consult   Living arrangements for the past 2 months: Single Family Home                                      Prior Living Arrangements/Services Living arrangements for the past 2 months: Single Family Home Lives with:: Siblings              Current home services: DME (walker, cane, bsc, [place])    Activities of Daily Living   ADL Screening (condition at time of admission) Independently performs ADLs?: Yes (appropriate for developmental age) Is the patient deaf or have difficulty hearing?: No Does the patient have difficulty seeing, even when wearing glasses/contacts?: No Does the patient have difficulty concentrating, remembering, or making decisions?: No  Permission Sought/Granted                  Emotional Assessment Appearance:: Appears stated age Attitude/Demeanor/Rapport: Other (comment) (engaged as much as possible, he was actively twitching and jerking during assessment but remained A&O) Affect (typically observed): Appropriate Orientation: : Oriented to  Place, Oriented to  Time, Oriented to Situation, Oriented to Self   Psych Involvement: No (comment)  Admission diagnosis:  Breakthrough seizure (HCC) [G40.919] Seizure (HCC) [R56.9] Patient Active Problem List   Diagnosis Date Noted   HLD (hyperlipidemia) 07/23/2023   BPH (benign prostatic hyperplasia) 07/23/2023   Chronic diastolic CHF (congestive heart failure) (HCC) 03/09/2023   Paroxysmal atrial fibrillation (HCC) 03/09/2023   Seizure (HCC) 03/09/2023   Toxic metabolic encephalopathy 03/09/2023   Acute metabolic encephalopathy 01/26/2023   Malnutrition of moderate degree 12/24/2022   ETOH abuse 12/22/2022   Thrombocytopenia (HCC) 12/22/2022   Breakthrough seizure (HCC) 12/22/2022   CKD (chronic kidney disease), stage III (HCC) 09/25/2022   Chest congestion 04/04/2022   Chronic cough 04/04/2022   Dilated cardiomyopathy (HCC) 02/26/2022   Alcohol abuse 02/26/2022   Seizures (HCC) 02/20/2022   Transaminitis    Traumatic rhabdomyolysis (HCC)    Thrombocytopenia (HCC)    History of CVA (cerebrovascular accident) 10/22/2016   Essential hypertension 10/22/2016   Alcoholism (HCC) 10/22/2016   Cerebrovascular accident (CVA) (HCC) 04/05/2016   PCP:  Patient, No Pcp Per Pharmacy:   [facility] - [place], [place] - [address] [place] [address] Phone: [phone] Fax: [phone]     Social Drivers of Health (SDOH) Social  History: SDOH Screenings   Food Insecurity: No Food Insecurity (08/26/2023)  Housing: Low Risk  (08/26/2023)  Transportation Needs: No Transportation Needs (08/26/2023)  Utilities: Not At Risk (08/26/2023)  Financial Resource Strain: Medium Risk (04/06/2023)   Received from [facility]  Social Connections: Moderately Isolated (08/26/2023)  Tobacco Use: Medium Risk (08/26/2023)   SDOH Interventions:     Readmission Risk Interventions     No data to display

## 2023-08-26 NOTE — ED Notes (Signed)
 Report received from Jeanna RN, assuming pt care.

## 2023-08-26 NOTE — ED Triage Notes (Signed)
 BIBA due to sister calling in seizure that lasted 20 minutes.  Pt has history of seizure and stroke.  Pt took his medicine tonight but stated he was drinking alcohol tonight,  Per EMS  2.5 versed  x2 124/64 HR 60's 20 R wrist

## 2023-08-26 NOTE — ED Notes (Signed)
 Spoke with pt's sister, Dickey Bloodgood, whom pt lives with. She reports that pt was last seen at baseline at 1900. She says that she heard a loud noise in his room at approx 0200 and upon entering room, pt was sitting in a chair and was stiff and not answering her questions.

## 2023-08-26 NOTE — Progress Notes (Addendum)
 OT Cancellation Note  Patient Details Name: John Spears MRN: [id number] DOB: 03-23-1950   Cancelled Treatment:    Reason Eval/Treat Not Completed: Medical issues which prohibited therapy. Orders received, chart reviewed. Pt continues to have episodes of seizure-like activity. Secure chat to MD - OT will hold eval at this time and re-attempt tomorrow as appropriate following input from neurology.    Treyshaun Keatts L. Paiten Boies, OTR/L  08/26/23, 10:20 AM

## 2023-08-26 NOTE — ED Notes (Signed)
RN to CT with pt.

## 2023-08-26 NOTE — Consult Note (Signed)
 NEUROLOGY CONSULT NOTE   Date of service: August 26, 2023 Patient Name: John Spears MRN:  [id number] DOB:  [birth year] Chief Complaint: seizure Requesting Provider: Luz Skelton, MD  History of Present Illness   This is a 73 year old gentleman very well-known to our service with history of alcohol abuse, hypertension, prior stroke with mild right-sided hemiparesis at baseline, hyperlipidemia, A-fib on Xarelto , seizures with multiple episodes of breakthrough seizures due to noncompliance who presented for evaluation of breakthrough seizures.  Sister reported to the admitting physician that she heard a popping sound from his room for approximately 15 minutes straight and she went to check on him and his whole body was stiff and shaking.  He did have urinary incontinence.  He had open container of beer in the room but she did not see him drink the beer. Per ED notes sister Dickey has been giving him his AEDs and so he has been taking them this time although historically he has been noncompliant.  He is prescribed 1500 mg of Keppra  twice daily and Vimpat  150 mg twice daily.  When I went to evaluate him in the ED today he was in focal motor status with retained awareness.  This aborted with 2 mg of IV Ativan .   ROS   Unable to ascertain due to baseline cognitive impairment  Past History   Past Medical History:  Diagnosis Date   Alcohol abuse    Heart failure with improved ejection fraction (HFimpEF) (HCC) 02/2022   a. 02/2019 Echo: EF 60-65%, GrI DD; b. 01/2022 Echo: EF 30-35%; c. 06/2022 Echo: EF 60-65%, mild LVH, RI DD, nl RV fxn, triv MR.   Hypertension    Seizure (HCC)    Seizure disorder [facility])     Past Surgical History:  Procedure Laterality Date   JOINT REPLACEMENT      Family History: Family History  Problem Relation Age of Onset   Heart attack Mother 36    Social History  reports that he has quit smoking. He has never used smokeless tobacco. He reports that he does not  currently use alcohol after a past usage of about 84.0 standard drinks of alcohol per week. He reports that he does not currently use drugs after having used the following drugs: Marijuana.  Not on File  Medications   Current Facility-Administered Medications:    amLODipine  (NORVASC ) tablet 10 mg, 10 mg, Oral, Daily, Sira, Zackery, MD, 10 mg at 08/26/23 0802   aspirin  EC tablet 81 mg, 81 mg, Oral, Daily, Sira, Zackery, MD, 81 mg at 08/26/23 9051   atorvastatin  (LIPITOR) tablet 40 mg, 40 mg, Oral, QHS, Sira, Zackery, MD   carvedilol  (COREG ) tablet 12.5 mg, 12.5 mg, Oral, BID WC, Sira, Zackery, MD, 12.5 mg at 08/26/23 0802   cyanocobalamin  (VITAMIN B12) tablet 1,000 mcg, 1,000 mcg, Oral, Daily, Sira, Zackery, MD, 1,000 mcg at 08/26/23 9051   folic acid  (FOLVITE ) tablet 1 mg, 1 mg, Oral, Daily, Sira, Zackery, MD, 1 mg at 08/26/23 1124   lacosamide  (VIMPAT ) tablet 150 mg, 150 mg, Oral, BID, Sira, Zackery, MD, 150 mg at 08/26/23 0845   levETIRAcetam  (KEPPRA ) tablet 1,500 mg, 1,500 mg, Oral, BID, Sira, Zackery, MD   LORazepam  (ATIVAN ) injection 1 mg, 1 mg, Intravenous, Q5 min PRN, Cleatus Hoof V, MD, 1 mg at 08/26/23 9083   multivitamin with minerals tablet 1 tablet, 1 tablet, Oral, Daily, Sira, Zackery, MD, 1 tablet at 08/26/23 0948   rivaroxaban  (XARELTO ) tablet 20 mg, 20 mg, Oral, Q supper,  Sira, Zackery, MD   tamsulosin  (FLOMAX ) capsule 0.4 mg, 0.4 mg, Oral, QPC supper, Sira, Zackery, MD   thiamine  (VITAMIN B1) tablet 100 mg, 100 mg, Oral, Daily, Sira, Zackery, MD, 100 mg at Sep 24, 2023 9051  Current Outpatient Medications:    acetaminophen  (TYLENOL ) 500 MG tablet, Take 1 tablet (500 mg total) by mouth every 6 (six) hours as needed for moderate pain (pain score 4-6)., Disp: 30 tablet, Rfl: 0   amLODipine  (NORVASC ) 10 MG tablet, Take 10 mg by mouth at bedtime., Disp: , Rfl:    ASPIRIN  LOW DOSE 81 MG tablet, Take 81 mg by mouth daily., Disp: , Rfl:    atorvastatin  (LIPITOR) 40 MG tablet, Take 40  mg by mouth at bedtime., Disp: , Rfl:    carvedilol  (COREG ) 12.5 MG tablet, Take 1 tablet (12.5 mg total) by mouth 2 (two) times daily., Disp: 180 tablet, Rfl: 3   cyanocobalamin  (VITAMIN B12) 1000 MCG tablet, Take 1,000 mcg by mouth in the morning., Disp: , Rfl:    folic acid  (FOLVITE ) 1 MG tablet, Take 1 mg by mouth in the morning., Disp: , Rfl:    furosemide  (LASIX ) 20 MG tablet, TAKE 1 TABLET BY MOUTH ONCE A DAY, Disp: 90 tablet, Rfl: 3   lacosamide  150 MG TABS, Take 1 tablet (150 mg total) by mouth 2 (two) times daily., Disp: 60 tablet, Rfl: 0   levETIRAcetam  (KEPPRA ) 750 MG tablet, Take 2 tablets (1,500 mg total) by mouth 2 (two) times daily., Disp: 120 tablet, Rfl: 0   Multiple Vitamin (MULTIVITAMIN WITH MINERALS) TABS tablet, Take 1 tablet by mouth daily. (Patient taking differently: Take 1 tablet by mouth every evening.), Disp: , Rfl:    rivaroxaban  (XARELTO ) 20 MG TABS tablet, Take 1 tablet (20 mg total) by mouth daily with supper., Disp: 30 tablet, Rfl: 3   tamsulosin  (FLOMAX ) 0.4 MG CAPS capsule, Take 0.4 mg by mouth at bedtime., Disp: , Rfl:    thiamine  (VITAMIN B1) 100 MG tablet, Take 100 mg by mouth in the morning., Disp: , Rfl:    feeding supplement (ENSURE ENLIVE / ENSURE PLUS) LIQD, Take 237 mLs by mouth 2 (two) times daily between meals., Disp: , Rfl:    gabapentin  (NEURONTIN ) 100 MG capsule, Take 100 mg by mouth 2 (two) times daily. (Patient not taking: Reported on 2023-09-24), Disp: , Rfl:   Vitals   Vitals:   09-24-2023 1230 09/24/2023 1300 24-Sep-2023 1330 Sep 24, 2023 1345  BP: (!) 159/120 (!) 111/54 (!) 126/49   Pulse: (!) 140 61 (!) 57 (!) 57  Resp: (!) 22 20 (!) 31 19  Temp:      TempSrc:      SpO2: 100% 100% 100% 100%  Weight:      Height:        Body mass index is 23.04 kg/m.   Physical Exam   Gen: patient lying in bed, NAD CV: extremities appear well-perfused Resp: normal WOB  Neurologic exam MS: alert, oriented to self and hospital, follows commands Speech:  mild dysarthria, no aphasia CN: PERRL, VFF, EOMI, sensation intact, R facial droop, hearing intact to voice Motor: RUE rhythmic jerking, continuous and aborted with ativan . No movement to command RUE. RLE some movement but none against gravity. Anti-gravity LUE and LLE Sensory: SILT Coordination: FNF intact on L Gait: deferred   Labs/Imaging/Neurodiagnostic studies   CBC:  Recent Labs  Lab 09/24/23 0247  WBC 5.7  NEUTROABS 3.6  HGB 9.4*  HCT 28.8*  MCV 87.5  PLT 164  Basic Metabolic Panel:  Lab Results  Component Value Date   NA 139 08/26/2023   K 4.6 08/26/2023   CO2 25 08/26/2023   GLUCOSE 159 (H) 08/26/2023   BUN 19 08/26/2023   CREATININE 1.05 08/26/2023   CALCIUM  9.1 08/26/2023   GFRNONAA >60 08/26/2023   GFRAA >60 04/04/2019   Lipid Panel:  Lab Results  Component Value Date   LDLCALC 133 (H) 03/08/2019   HgbA1c:  Lab Results  Component Value Date   HGBA1C 5.3 01/26/2023   Urine Drug Screen:     Component Value Date/Time   LABOPIA NONE DETECTED 08/26/2023 0445   LABOPIA NONE DETECTED 01/27/2023 1800   COCAINSCRNUR NONE DETECTED 08/26/2023 0445   LABBENZ POSITIVE (A) 08/26/2023 0445   LABBENZ POSITIVE (A) 01/27/2023 1800   AMPHETMU NONE DETECTED 08/26/2023 0445   AMPHETMU NONE DETECTED 01/27/2023 1800   THCU NONE DETECTED 08/26/2023 0445   THCU NONE DETECTED 01/27/2023 1800   LABBARB NONE DETECTED 08/26/2023 0445   LABBARB POSITIVE (A) 01/27/2023 1800    Alcohol Level     Component Value Date/Time   [facility] <15 08/26/2023 0247   INR  Lab Results  Component Value Date   INR 1.8 (H) 08/26/2023   APTT  Lab Results  Component Value Date   APTT 42 (H) 08/26/2023   AED levels:  Lab Results  Component Value Date   PHENYTOIN  11.9 02/02/2023   LEVETIRACETA 66.5 (H) 07/23/2023    MRI Brain(Personally reviewed): Limited, aborted 2/2 active seizures. No diffusion restriction to suggest acute infarct  ASSESSMENT   This is a 73 year old  gentleman very well-known to our service with history of alcohol abuse, hypertension, prior stroke with mild right-sided hemiparesis at baseline, hyperlipidemia, A-fib on Xarelto , seizures with multiple episodes of breakthrough seizures due to noncompliance who presented for evaluation of breakthrough seizures.  His sister has started giving him his seizure medications and therefore he has been compliant with them at home.  He is currently prescribed Keppra  1500 mg twice daily and Vimpat  150 mg twice daily.  I have loaded him with both Keppra  and Vimpat  given his ongoing focal motor seizures and my inability to get sister on the phone to confirm compliance.  However since she did tell the ED that he has been getting his meds I will increase his maintenance Vimpat  to 200 mg twice daily.  Continue Keppra  at current dose.  No evidence of acute infarct on limited MRI.  Etiology of his seizures is secondary to his known remote stroke.  RECOMMENDATIONS   - Continue keppra  1500mg  bid - Increase vimpat  to 200mg  bid - 2mg  IV ativan  prn for breakthrough seizure lasting >5 min. Aggressive measures such as intubation and sedation should not be pursued for focal motor seizures unless he generalizes or has unstable vital signs. Please contact neurology with any questions regarding mgmt of acute seizures ______________________________________________________________________    Signed, Elida CHRISTELLA Ross, MD [facility]

## 2023-08-26 NOTE — [place] FL2 (Signed)
 [place]  MEDICAID FL2 LEVEL OF CARE FORM     IDENTIFICATION  Patient Name: John Spears Birthdate: 1950/09/24 Sex: male Admission Date (Current Location): 08/26/2023  [facility] and [place] Number:  Chiropodist and Address:  [facility], [address], [place], [place] [address]      Provider Number: [id number]  Attending Physician Name and Address:  Luz Skelton, MD  Relative Name and Phone Number:  Dickey Bloodgood (HCPOA/Sister) (H) [phone]    Current Level of Care: Hospital Recommended Level of Care: Skilled Nursing Facility Prior Approval Number:    Date Approved/Denied:   PASRR Number: [id number]  Discharge Plan: SNF    Current Diagnoses: Patient Active Problem List   Diagnosis Date Noted   HLD (hyperlipidemia) 07/23/2023   BPH (benign prostatic hyperplasia) 07/23/2023   Chronic diastolic CHF (congestive heart failure) (HCC) 03/09/2023   Paroxysmal atrial fibrillation (HCC) 03/09/2023   Seizure (HCC) 03/09/2023   Toxic metabolic encephalopathy 03/09/2023   Acute metabolic encephalopathy 01/26/2023   Malnutrition of moderate degree 12/24/2022   ETOH abuse 12/22/2022   Thrombocytopenia (HCC) 12/22/2022   Breakthrough seizure (HCC) 12/22/2022   CKD (chronic kidney disease), stage III (HCC) 09/25/2022   Chest congestion 04/04/2022   Chronic cough 04/04/2022   Dilated cardiomyopathy (HCC) 02/26/2022   Alcohol abuse 02/26/2022   Seizures (HCC) 02/20/2022   Transaminitis    Traumatic rhabdomyolysis (HCC)    Thrombocytopenia (HCC)    History of CVA (cerebrovascular accident) 10/22/2016   Essential hypertension 10/22/2016   Alcoholism (HCC) 10/22/2016   Cerebrovascular accident (CVA) (HCC) 04/05/2016    Orientation RESPIRATION BLADDER Height & Weight     Self, Time, Situation  O2   Weight: 74.9 kg Height:  5' 11 (180.3 cm)  BEHAVIORAL SYMPTOMS/MOOD NEUROLOGICAL BOWEL NUTRITION STATUS        Diet  (Heart)  AMBULATORY STATUS COMMUNICATION OF NEEDS Skin     Verbally                         Personal Care Assistance Level of Assistance  Feeding, Dressing, Bathing Bathing Assistance: Limited assistance Feeding assistance: Independent Dressing Assistance: Limited assistance     Functional Limitations Info             SPECIAL CARE FACTORS FREQUENCY                       Contractures      Additional Factors Info  Allergies   Allergies Info: Not on File           Current Medications (08/26/2023):  This is the current hospital active medication list Current Facility-Administered Medications  Medication Dose Route Frequency Provider Last Rate Last Admin   amLODipine  (NORVASC ) tablet 10 mg  10 mg Oral Daily Sira, Zackery, MD   10 mg at 08/26/23 0802   aspirin  EC tablet 81 mg  81 mg Oral Daily Sira, Zackery, MD   81 mg at 08/26/23 9051   atorvastatin  (LIPITOR) tablet 40 mg  40 mg Oral QHS Sira, Zackery, MD       carvedilol  (COREG ) tablet 12.5 mg  12.5 mg Oral BID WC Sira, Zackery, MD   12.5 mg at 08/26/23 0802   cyanocobalamin  (VITAMIN B12) tablet 1,000 mcg  1,000 mcg Oral Daily Sira, Zackery, MD   1,000 mcg at 08/26/23 0948   folic acid  (FOLVITE ) tablet 1 mg  1 mg Oral Daily Sira, Zackery,  MD   1 mg at 08/26/23 1124   lacosamide  (VIMPAT ) tablet 150 mg  150 mg Oral BID Sira, Zackery, MD   150 mg at 08/26/23 0845   levETIRAcetam  (KEPPRA ) tablet 1,500 mg  1,500 mg Oral BID Sira, Zackery, MD       LORazepam  (ATIVAN ) injection 1 mg  1 mg Intravenous Q5 min PRN Duncan, Hazel V, MD   1 mg at 08/26/23 9083   multivitamin with minerals tablet 1 tablet  1 tablet Oral Daily Sira, Zackery, MD   1 tablet at 08/26/23 9051   rivaroxaban  (XARELTO ) tablet 20 mg  20 mg Oral Q supper Sira, Zackery, MD       tamsulosin  (FLOMAX ) capsule 0.4 mg  0.4 mg Oral QPC supper Sira, Zackery, MD       thiamine  (VITAMIN B1) tablet 100 mg  100 mg Oral Daily Sira, Zackery, MD   100 mg at 08/26/23  9051   Current Outpatient Medications  Medication Sig Dispense Refill   acetaminophen  (TYLENOL ) 500 MG tablet Take 1 tablet (500 mg total) by mouth every 6 (six) hours as needed for moderate pain (pain score 4-6). 30 tablet 0   amLODipine  (NORVASC ) 10 MG tablet Take 10 mg by mouth at bedtime.     ASPIRIN  LOW DOSE 81 MG tablet Take 81 mg by mouth daily.     atorvastatin  (LIPITOR) 40 MG tablet Take 40 mg by mouth at bedtime.     carvedilol  (COREG ) 12.5 MG tablet Take 1 tablet (12.5 mg total) by mouth 2 (two) times daily. 180 tablet 3   cyanocobalamin  (VITAMIN B12) 1000 MCG tablet Take 1,000 mcg by mouth in the morning.     folic acid  (FOLVITE ) 1 MG tablet Take 1 mg by mouth in the morning.     furosemide  (LASIX ) 20 MG tablet TAKE 1 TABLET BY MOUTH ONCE A DAY 90 tablet 3   lacosamide  150 MG TABS Take 1 tablet (150 mg total) by mouth 2 (two) times daily. 60 tablet 0   levETIRAcetam  (KEPPRA ) 750 MG tablet Take 2 tablets (1,500 mg total) by mouth 2 (two) times daily. 120 tablet 0   Multiple Vitamin (MULTIVITAMIN WITH MINERALS) TABS tablet Take 1 tablet by mouth daily. (Patient taking differently: Take 1 tablet by mouth every evening.)     rivaroxaban  (XARELTO ) 20 MG TABS tablet Take 1 tablet (20 mg total) by mouth daily with supper. 30 tablet 3   tamsulosin  (FLOMAX ) 0.4 MG CAPS capsule Take 0.4 mg by mouth at bedtime.     thiamine  (VITAMIN B1) 100 MG tablet Take 100 mg by mouth in the morning.     feeding supplement (ENSURE ENLIVE / ENSURE PLUS) LIQD Take 237 mLs by mouth 2 (two) times daily between meals.     gabapentin  (NEURONTIN ) 100 MG capsule Take 100 mg by mouth 2 (two) times daily. (Patient not taking: Reported on 08/26/2023)       Discharge Medications: Please see discharge summary for a list of discharge medications.  Relevant Imaging Results:  Relevant Lab Results:   Additional Information SS#: [id number]  Dalia GORMAN Fuse, RN

## 2023-08-26 NOTE — Progress Notes (Signed)
 PT Cancellation Note  Patient Details Name: John Spears MRN: [id number] DOB: 18-May-1950   Cancelled Treatment:    Reason Eval/Treat Not Completed: Patient not medically ready. Orders received and chart reviewed. Per EMR pt continues to have seizure like activity. Per OT, secure chat with attending MD requesting to hold until tomorrow after Neuro consult complete. PT to re-attempt tomorrow as appropriate.    Dorina HERO. Fairly IV, PT, DPT Physical Therapist- [place]  [facility] 08/26/2023, 11:40 AM

## 2023-08-26 NOTE — ED Notes (Signed)
 ED pyxis has insufficient dose for keppra . Pharmacy made aware.

## 2023-08-26 NOTE — ED Notes (Signed)
 Attempted to help patient eat. Pt stated he wants to try to do it on his own. Pt food tray set up.

## 2023-08-26 NOTE — ED Notes (Signed)
 Pt is CAOx4, breathing normally, and normal in color. Pt is lying in bed and appears relaxed at this time. Pt is able to tell me why and how he arrived at the ED. PT is not complaining of anything at this time. Pt denies any needs.

## 2023-08-26 NOTE — ED Notes (Signed)
 RN back to ED 8 with pt from CT. Pt attached to monitor

## 2023-08-26 NOTE — ED Notes (Signed)
 Pt provided peri care and diaper changed. New bed pad applied.

## 2023-08-27 DIAGNOSIS — G40919 Epilepsy, unspecified, intractable, without status epilepticus: Secondary | ICD-10-CM | POA: Diagnosis not present

## 2023-08-27 LAB — PROTIME-INR
INR: 2.1 — ABNORMAL HIGH (ref 0.8–1.2)
Prothrombin Time: 24.2 s — ABNORMAL HIGH (ref 11.4–15.2)

## 2023-08-27 LAB — TSH: TSH: 0.884 u[IU]/mL (ref 0.350–4.500)

## 2023-08-27 LAB — CBC
HCT: 28.3 % — ABNORMAL LOW (ref 39.0–52.0)
Hemoglobin: 9.2 g/dL — ABNORMAL LOW (ref 13.0–17.0)
MCH: 28.1 pg (ref 26.0–34.0)
MCHC: 32.5 g/dL (ref 30.0–36.0)
MCV: 86.5 fL (ref 80.0–100.0)
Platelets: 157 K/uL (ref 150–400)
RBC: 3.27 MIL/uL — ABNORMAL LOW (ref 4.22–5.81)
RDW: 15.9 % — ABNORMAL HIGH (ref 11.5–15.5)
WBC: 5.4 K/uL (ref 4.0–10.5)
nRBC: 0 % (ref 0.0–0.2)

## 2023-08-27 LAB — COMPREHENSIVE METABOLIC PANEL WITH GFR
ALT: 9 U/L (ref 0–44)
AST: 18 U/L (ref 15–41)
Albumin: 3.1 g/dL — ABNORMAL LOW (ref 3.5–5.0)
Alkaline Phosphatase: 64 U/L (ref 38–126)
Anion gap: 8 (ref 5–15)
BUN: 14 mg/dL (ref 8–23)
CO2: 26 mmol/L (ref 22–32)
Calcium: 9.1 mg/dL (ref 8.9–10.3)
Chloride: 105 mmol/L (ref 98–111)
Creatinine, Ser: 0.75 mg/dL (ref 0.61–1.24)
GFR, Estimated: >60 mL/min (ref >60)
Glucose, Bld: 93 mg/dL (ref 70–99)
Potassium: 3.8 mmol/L (ref 3.5–5.1)
Sodium: 139 mmol/L (ref 135–145)
Total Bilirubin: 0.8 mg/dL (ref 0.0–1.2)
Total Protein: 6.2 g/dL — ABNORMAL LOW (ref 6.5–8.1)

## 2023-08-27 LAB — HEMOGLOBIN A1C
Hgb A1c MFr Bld: 4.7 % — ABNORMAL LOW (ref 4.8–5.6)
Mean Plasma Glucose: 88.19 mg/dL

## 2023-08-27 LAB — MAGNESIUM: Magnesium: 2 mg/dL (ref 1.7–2.4)

## 2023-08-27 LAB — C-REACTIVE PROTEIN: CRP: 0.8 mg/dL (ref <1.0)

## 2023-08-27 LAB — PHOSPHORUS: Phosphorus: 2.9 mg/dL (ref 2.5–4.6)

## 2023-08-27 MED ORDER — LACOSAMIDE 50 MG PO TABS
200.0000 mg | ORAL_TABLET | Freq: Two times a day (BID) | ORAL | Status: DC
  Administered 2023-08-27 – 2023-08-31 (×8): 200 mg via ORAL
  Filled 2023-08-27 (×3): qty 4
  Filled 2023-08-27 (×4): qty 1
  Filled 2023-08-27: qty 4
  Filled 2023-08-27: qty 1
  Filled 2023-08-27 (×2): qty 4

## 2023-08-27 NOTE — Evaluation (Signed)
 Physical Therapy Evaluation Patient Details Name: John Spears MRN: [id number] DOB: 1951-02-18 Today's Date: 08/27/2023  History of Present Illness  Pt is a 73 year old male presented for evaluation of breakthrough seizures    PMH significant for alcohol abuse, hypertension, prior stroke with mild right-sided hemiparesis at baseline, hyperlipidemia, A-fib on Xarelto , seizures with multiple episodes of breakthrough seizures due to noncompliance.   Clinical Impression  Pt admitted with above diagnosis. Pt currently with functional limitations due to the deficits listed below (see PT Problem List). Pt received supine in bed agreeable to PT services. Reports PTA still living with sister. Confirms recent stay at [facility] but does not elaborate. States ambulating at home without RW and sometimes with RW.   To date, pt exits bed mod-I and able to stand at EOB and STS at [facility]. 2 trials of gait performed. One without AD, and one with RW. Without AD pt has very shuffled gait, poor foot clearance with regular LOB needing CGA to minA to correct. With RW pt able to ambulate CGA with improved step heights and lengths but does need some TC's on RW for navigating safe use. Pt with poor safety awareness in general needing regular multi modal cuing for reduced falls risk and with stand to sit transfers with poor hand placement and body positioning. Pt left in recliner in care of OT. Pt will benefit from skilled PT services < 3 hours/day to address safety awareness and gait/mobility deficits to reduce falls risk.       If plan is discharge home, recommend the following: A little help with walking and/or transfers;Assist for transportation;Help with stairs or ramp for entrance;Assistance with cooking/housework   Can travel by private vehicle   Yes    Equipment Recommendations None recommended by PT  Recommendations for Other Services       Functional Status Assessment Patient has had a recent decline in their  functional status and demonstrates the ability to make significant improvements in function in a reasonable and predictable amount of time.     Precautions / Restrictions Precautions Precautions: Fall Recall of Precautions/Restrictions: Impaired Restrictions Weight Bearing Restrictions Per Provider Order: No      Mobility  Bed Mobility Overal bed mobility: Modified Independent             General bed mobility comments: with bed features Patient Response: Cooperative, Flat affect  Transfers Overall transfer level: Needs assistance Equipment used: None Transfers: Sit to/from Stand Sit to Stand: Contact guard assist, Supervision           General transfer comment: CGA from EOB. supervision from chair with arm rests    Ambulation/Gait Ambulation/Gait assistance: Contact guard assist, Supervision, Min assist Gait Distance (Feet): 144 Feet (60' no AD, 4' with RW) Assistive device: Rolling walker (2 wheels), None Gait Pattern/deviations: Step-to pattern, Trunk flexed, Narrow base of support, Shuffle       General Gait Details: Trialed gait with and without AD. Without AD pt with significant shuffling gait with regular LOB needing CGA to minA to correct. With RW able to progress to close supervision.  Stairs            Wheelchair Mobility     Tilt Bed Tilt Bed Patient Response: Cooperative, Flat affect  Modified Rankin (Stroke Patients Only)       Balance Overall balance assessment: Needs assistance Sitting-balance support: Feet supported Sitting balance-Leahy Scale: Good     Standing balance support: No upper extremity supported Standing balance-Leahy Scale:  Fair Standing balance comment: static standing at bedside without UE support                             Pertinent Vitals/Pain Pain Assessment Pain Assessment: No/denies pain    Home Living Family/patient expects to be discharged to:: Private residence Living Arrangements:  Other relatives (sister) Available Help at Discharge: Family;Available 24 hours/day Type of Home: House Home Access: Stairs to enter Entrance Stairs-Rails: Right Entrance Stairs-Number of Steps: 3   Home Layout: One level Home Equipment: Shower seat;Grab bars - tub/shower;Rolling Environmental consultant (2 wheels) Additional Comments: home set up from chart- pt is a poor historian at this time    Prior Function Prior Level of Function : Needs assist;Patient poor historian/Family not available             Mobility Comments: pt reports amb with no AD, recent rehab stay ADLs Comments: pt reports he is MOD I-I in ADL, sister helps with IADLs     Extremity/Trunk Assessment   Upper Extremity Assessment Upper Extremity Assessment: Defer to OT evaluation RUE Deficits / Details: generalized tremors    Lower Extremity Assessment Lower Extremity Assessment: Generalized weakness    Cervical / Trunk Assessment Cervical / Trunk Assessment: Normal  Communication   Communication Communication: Impaired Factors Affecting Communication: Difficulty expressing self;Reduced clarity of speech    Cognition Arousal: Alert Behavior During Therapy: Flat affect   PT - Cognitive impairments: Safety/Judgement, No family/caregiver present to determine baseline                         Following commands: Impaired Following commands impaired: Follows one step commands with increased time     Cueing Cueing Techniques: Verbal cues, Gestural cues     General Comments General comments (skin integrity, edema, etc.): SPO2 97% throughout session on RA    Exercises Other Exercises Other Exercises: benefit of RW for reduced falls risk.   Assessment/Plan    PT Assessment Patient needs continued PT services  PT Problem List Decreased strength;Decreased balance;Decreased activity tolerance;Decreased safety awareness       PT Treatment Interventions DME instruction;Therapeutic activities;Gait  training;Therapeutic exercise;Patient/family education;Stair training;Balance training;Functional mobility training;Neuromuscular re-education    PT Goals (Current goals can be found in the Care Plan section)  Acute Rehab PT Goals Patient Stated Goal: to return home PT Goal Formulation: With patient Time For Goal Achievement: 09/10/23 Potential to Achieve Goals: Fair    Frequency Min 3X/week     Co-evaluation               AM-PAC PT 6 Clicks Mobility  Outcome Measure Help needed turning from your back to your side while in a flat bed without using bedrails?: A Little Help needed moving from lying on your back to sitting on the side of a flat bed without using bedrails?: A Little Help needed moving to and from a bed to a chair (including a wheelchair)?: A Little Help needed standing up from a chair using your arms (e.g., wheelchair or bedside chair)?: A Little Help needed to walk in hospital room?: A Lot Help needed climbing 3-5 steps with a railing? : A Lot 6 Click Score: 16    End of Session Equipment Utilized During Treatment: Gait belt Activity Tolerance: Patient tolerated treatment well Patient left: in chair (in care of OT) Nurse Communication: Mobility status PT Visit Diagnosis: Unsteadiness on feet (R26.81);Other abnormalities of gait and  mobility (R26.89);Muscle weakness (generalized) (M62.81);Difficulty in walking, not elsewhere classified (R26.2)    Time: 9045-8989 PT Time Calculation (min) (ACUTE ONLY): 16 min   Charges:   PT Evaluation $PT Eval Low Complexity: 1 Low   PT General Charges $$ ACUTE PT VISIT: 1 Visit         Dorina HERO. Fairly IV, PT, DPT Physical Therapist- [place]  [facility] 08/27/2023, 1:09 PM

## 2023-08-27 NOTE — Progress Notes (Addendum)
 Progress Note    John Spears  FMW:[id number] DOB: 01-25-1951  DOA: 08/26/2023 PCP: Patient, No Pcp Per      Brief Narrative:    Medical records reviewed and are as summarized below:  John Spears is a 73 y.o. male with medical history significant for prior stroke with mild right-sided hemiparesis, hyperlipidemia, atrial fibrillation on Xarelto , seizures with multiple episodes of breakthrough seizures, medical nonadherence, alcohol use disorder, who presented to the hospital for evaluation of breakthrough seizures. Reportedly, his sister heard a popping sound from his room so she went to check on him.  On arrival, she noticed that patient's whole body was stiff and shaking.  Patient also had urinary incontinence. Apparently there was an open container of beer in his room.    Prior to admission, he was on Keppra  1500 mg twice daily and Vimpat  150 mg twice daily.        Assessment/Plan:   Principal Problem:   Breakthrough seizure (HCC) Active Problems:   Seizure (HCC)    Body mass index is 23.04 kg/m.   Breakthrough seizures in a patient with seizure disorder: He was evaluated by the neurologist.  Continue Keppra  1500 mg twice daily.  Neurologist recommended increasing Vimpat  from 150 mg to 200 mg twice daily. Seizure precautions. No evidence of acute stroke on limited MRI brain.   History of stroke with mild right-sided hemiparesis: Continue aspirin  and Lipitor   Hypomagnesemia: Improved   Paroxysmal atrial fibrillation: Continue Xarelto    General Weakness: PT recommended discharge to SNF   Comorbidities include alcohol use disorder, BPH, chronic anemia, hypertension   Diet Order             Diet Heart Room service appropriate? Yes; Fluid consistency: Thin  Diet effective now                            Consultants: Neurologist  Procedures: None    Medications:    amLODipine   10 mg Oral Daily   aspirin  EC  81 mg  Oral Daily   atorvastatin   40 mg Oral QHS   carvedilol   12.5 mg Oral BID WC   vitamin B-12  1,000 mcg Oral Daily   folic acid   1 mg Oral Daily   lacosamide   200 mg Oral BID   levETIRAcetam   1,500 mg Oral BID   multivitamin with minerals  1 tablet Oral Daily   rivaroxaban   20 mg Oral Q supper   tamsulosin   0.4 mg Oral QPC supper   thiamine   100 mg Oral Daily   Continuous Infusions:   Anti-infectives (From admission, onward)    None              Family Communication/Anticipated D/C date and plan/Code Status   DVT prophylaxis:  rivaroxaban  (XARELTO ) tablet 20 mg     Code Status: Prior  Family Communication: Plan of care was discussed with Dickey, sister, over the phone. Disposition Plan: Plan to discharge to SNF versus home   Status is: Inpatient Remains inpatient appropriate because: Breakthrough seizures       Subjective:   Interval events noted.  He has no complaints.  Objective:    Vitals:   08/26/23 2329 08/27/23 0419 08/27/23 0800 08/27/23 1255  BP: 128/64 131/60 (!) 147/69 130/68  Pulse: 60 62 65 61  Resp: 17 18 18 15   Temp: 98 F (36.7 C) 98 F (36.7 C) 97.8 F (36.6 C)  97.8 F (36.6 C)  TempSrc: Oral Oral  Axillary  SpO2: 100% 100% 100% 99%  Weight:      Height:       No data found.   Intake/Output Summary (Last 24 hours) at 08/27/2023 1632 Last data filed at 08/27/2023 1423 Gross per 24 hour  Intake 0 ml  Output 750 ml  Net -750 ml   Filed Weights   08/26/23 0239  Weight: 74.9 kg    Exam:  GEN: NAD SKIN: Warm and dry EYES: No pallor or icterus ENT: MMM CV: RRR PULM: CTA B ABD: soft, ND, NT, +BS CNS: AAO x 2 (person and place), disoriented to situation, mild right-sided hemiparesis EXT: No edema or tenderness        Data Reviewed:   I have personally reviewed following labs and imaging studies:  Labs: Labs show the following:   Basic Metabolic Panel: Recent Labs  Lab 08/26/23 0247 08/26/23 0857  08/27/23 0352  NA 138 139 139  K 3.8 4.6 3.8  CL 107 103 105  CO2 24 25 26   GLUCOSE 129* 159* 93  BUN 22 19 14   CREATININE 1.15 1.05 0.75  CALCIUM  8.9 9.1 9.1  MG 1.6* 2.1 2.0  PHOS 2.6  --  2.9   GFR Estimated Creatinine Clearance: 89.7 mL/min (by C-G formula based on SCr of 0.75 mg/dL). Liver Function Tests: Recent Labs  Lab 08/26/23 0247 08/26/23 0857 08/27/23 0352  AST 30 23 18   ALT 13 12 9   ALKPHOS 81 81 64  BILITOT 0.6 0.7 0.8  PROT 6.1* 6.5 6.2*  ALBUMIN 3.2* 3.4* 3.1*   No results for input(s): LIPASE, AMYLASE in the last 168 hours. No results for input(s): AMMONIA in the last 168 hours. Coagulation profile Recent Labs  Lab 08/26/23 0247 08/26/23 0857 08/27/23 0352  INR 2.7* 1.8* 2.1*    CBC: Recent Labs  Lab 08/26/23 0247 08/27/23 0352  WBC 5.7 5.4  NEUTROABS 3.6  --   HGB 9.4* 9.2*  HCT 28.8* 28.3*  MCV 87.5 86.5  PLT 164 157   Cardiac Enzymes: Recent Labs  Lab 08/26/23 0857  CKTOTAL 58   BNP (last 3 results) No results for input(s): PROBNP in the last 8760 hours. CBG: Recent Labs  Lab 08/26/23 0252  GLUCAP 129*   D-Dimer: No results for input(s): DDIMER in the last 72 hours. Hgb A1c: Recent Labs    08/27/23 0352  HGBA1C 4.7*   Lipid Profile: No results for input(s): CHOL, HDL, LDLCALC, TRIG, CHOLHDL, LDLDIRECT in the last 72 hours. Thyroid function studies: Recent Labs    08/27/23 0352  TSH 0.884   Anemia work up: No results for input(s): VITAMINB12, FOLATE, FERRITIN, TIBC, IRON , RETICCTPCT in the last 72 hours. Sepsis Labs: Recent Labs  Lab 08/26/23 0247 08/27/23 0352  WBC 5.7 5.4    Microbiology No results found for this or any previous visit (from the past 240 hours).  Procedures and diagnostic studies:  MR BRAIN WO CONTRAST Result Date: 08/26/2023 CLINICAL DATA:  73 year old male with neurologic deficit, seizure. EXAM: MRI HEAD WITHOUT CONTRAST TECHNIQUE: Multiplanar,  multiecho pulse sequences of the brain and surrounding structures were obtained without intravenous contrast. COMPARISON:  CT head, CTA earlier today.  Brain MRI 02/21/2022. FINDINGS: The examination had to be discontinued prior to completion due to active patient seizures. Mildly motion degraded axial DWI and moderately motion degraded FLAIR imaging was obtained. No restricted diffusion or evidence of acute infarction. No intracranial mass effect, midline shift,  or ventriculomegaly. Chronic encephalomalacia in the left parietal lobe, small chronic right thalamic lacunar infarct redemonstrated. IMPRESSION: Limited noncontrast Brain MRI, aborted due to active patient seizing. No evidence of acute ischemia. Chronic left parietal lobe and right thalamic infarcts. Electronically Signed   By: VEAR Hurst M.D.   On: 08/26/2023 09:49   DG Chest Port 1 View Result Date: 08/26/2023 CLINICAL DATA:  Seizure. EXAM: PORTABLE CHEST 1 VIEW COMPARISON:  07/26/2023 FINDINGS: The cardio pericardial silhouette is enlarged. The lungs are clear without focal pneumonia, edema, pneumothorax or pleural effusion. No acute bony abnormality. Telemetry leads overlie the chest. IMPRESSION: Enlargement of the cardiopericardial silhouette without acute cardiopulmonary findings. Electronically Signed   By: Camellia Candle M.D.   On: 08/26/2023 07:44   CT HEAD WO CONTRAST Result Date: 08/26/2023 CLINICAL DATA:  73 year old male neurologic deficit, seizure. EXAM: CT ANGIOGRAPHY HEAD AND NECK WITH AND WITHOUT CONTRAST TECHNIQUE: Multidetector CT imaging of the head and neck was performed using the standard protocol during bolus administration of intravenous contrast. Multiplanar CT image reconstructions and MIPs were obtained to evaluate the vascular anatomy. Carotid stenosis measurements (when applicable) are obtained utilizing NASCET criteria, using the distal internal carotid diameter as the denominator. RADIATION DOSE REDUCTION: This exam was  performed according to the departmental dose-optimization program which includes automated exposure control, adjustment of the mA and/or kV according to patient size and/or use of iterative reconstruction technique. CONTRAST:  OMNIPAQUE  IOHEXOL  350 MG/ML SOLN COMPARISON:  Head CT 07/23/2023 and earlier. CTA head and neck 03/09/2023 and earlier. FINDINGS: CT HEAD Brain: Chronic left parietal lobe cystic encephalomalacia is unchanged from 2023. Stable cerebral volume since that time. No midline shift, ventriculomegaly, mass effect, evidence of mass lesion, intracranial hemorrhage or evidence of cortically based acute infarction. Small chronic right thalamic lacunar infarct is stable. Stable gray-white matter differentiation throughout the brain. Calvarium and skull base: Intact. No acute osseous abnormality identified. Paranasal sinuses: Visualized paranasal sinuses and mastoids are stable and well aerated. Orbits: Chronic right vertex simple scalp lipoma. No acute orbit or scalp soft tissue finding identified. CTA NECK Skeleton: Absent dentition. Chronic lower cervical spine disc and endplate degeneration. No acute osseous abnormality identified. Upper chest: Mild respiratory motion, otherwise negative. Other neck: Nonvascular neck soft tissue spaces appear negative. Aortic arch: 3 vessel arch. Minimal arch atherosclerosis. Tortuosity of the distal arch and proximal descending aorta. Right carotid system: Mildly tortuous brachiocephalic artery without plaque or stenosis. Normal right CCA origin. Soft plaque at the anterior wall of the right CCA at the level of the larynx without stenosis on series 8, image 127. Negative right carotid bifurcation, cervical right ICA. Left carotid system: Mild tortuosity. Mild motion artifact at the thoracic inlet. No plaque or stenosis. Vertebral arteries: Mild calcified plaque of the proximal right subclavian artery without stenosis. Normal right vertebral artery origin. Right  vertebral artery is patent, mildly tortuous to the skull base with no plaque or significant stenosis identified. Proximal left subclavian artery minimal plaque without stenosis. Normal left vertebral artery origin. Dominant left vertebral artery is patent with mild tortuosity, no stenosis to the skull base. CTA HEAD Posterior circulation: Patent distal vertebral arteries and vertebrobasilar junction with dominant left V4, diminutive non dominant right V4 segments unchanged from 2022 CTA. Both PICA origins are patent. Patent basilar artery without stenosis. Both posterior communicating arteries are present, patent SCA and PCA origins. Fetal type left PCA origin. Mildly ectatic basilar tip is stable since 2022 without discrete aneurysm. Bilateral PCA branches are  stable since 2022, patent with mild irregularity throughout. Anterior circulation: Both ICA siphons are patent. Left siphon calcified plaque is mild without stenosis. Normal left posterior communicating artery origin. Right siphon calcified plaque is moderate in the cavernous segment. And appears progressed since 2022 with mild to moderate right cavernous stenosis now (series 10, image 116). Negative supraclinoid right ICA. Patent carotid termini. MCA and ACA origins appears stable and normal. Anterior communicating artery is diminutive. Bilateral ACA branches are stable and within normal limits. Left MCA M1 segment and left MCA branches are stable since 2022, patent with only mild irregularity. Right MCA M1 segment and right MCA branches are stable since 2022 with mild irregularity. Venous sinuses: Early contrast timing, not well evaluated. Anatomic variants: Dominant left vertebral artery, fetal type left PCA origin. Review of the MIP images confirms the above findings IMPRESSION: 1. Continued stable CT appearance of the brain and CTA is negative for large vessel occlusion. 2. Positive for progressed Right ICA siphon calcified plaque since a 2022 CTA, now  with mild to moderate right cavernous ICA stenosis. 3. Stable mild for age atherosclerosis otherwise. No other significant arterial stenosis. Electronically Signed   By: VEAR Hurst M.D.   On: 08/26/2023 05:52   CT ANGIO HEAD NECK W WO CM Result Date: 08/26/2023 CLINICAL DATA:  73 year old male neurologic deficit, seizure. EXAM: CT ANGIOGRAPHY HEAD AND NECK WITH AND WITHOUT CONTRAST TECHNIQUE: Multidetector CT imaging of the head and neck was performed using the standard protocol during bolus administration of intravenous contrast. Multiplanar CT image reconstructions and MIPs were obtained to evaluate the vascular anatomy. Carotid stenosis measurements (when applicable) are obtained utilizing NASCET criteria, using the distal internal carotid diameter as the denominator. RADIATION DOSE REDUCTION: This exam was performed according to the departmental dose-optimization program which includes automated exposure control, adjustment of the mA and/or kV according to patient size and/or use of iterative reconstruction technique. CONTRAST:  OMNIPAQUE  IOHEXOL  350 MG/ML SOLN COMPARISON:  Head CT 07/23/2023 and earlier. CTA head and neck 03/09/2023 and earlier. FINDINGS: CT HEAD Brain: Chronic left parietal lobe cystic encephalomalacia is unchanged from 2023. Stable cerebral volume since that time. No midline shift, ventriculomegaly, mass effect, evidence of mass lesion, intracranial hemorrhage or evidence of cortically based acute infarction. Small chronic right thalamic lacunar infarct is stable. Stable gray-white matter differentiation throughout the brain. Calvarium and skull base: Intact. No acute osseous abnormality identified. Paranasal sinuses: Visualized paranasal sinuses and mastoids are stable and well aerated. Orbits: Chronic right vertex simple scalp lipoma. No acute orbit or scalp soft tissue finding identified. CTA NECK Skeleton: Absent dentition. Chronic lower cervical spine disc and endplate degeneration.  No acute osseous abnormality identified. Upper chest: Mild respiratory motion, otherwise negative. Other neck: Nonvascular neck soft tissue spaces appear negative. Aortic arch: 3 vessel arch. Minimal arch atherosclerosis. Tortuosity of the distal arch and proximal descending aorta. Right carotid system: Mildly tortuous brachiocephalic artery without plaque or stenosis. Normal right CCA origin. Soft plaque at the anterior wall of the right CCA at the level of the larynx without stenosis on series 8, image 127. Negative right carotid bifurcation, cervical right ICA. Left carotid system: Mild tortuosity. Mild motion artifact at the thoracic inlet. No plaque or stenosis. Vertebral arteries: Mild calcified plaque of the proximal right subclavian artery without stenosis. Normal right vertebral artery origin. Right vertebral artery is patent, mildly tortuous to the skull base with no plaque or significant stenosis identified. Proximal left subclavian artery minimal plaque without stenosis. Normal  left vertebral artery origin. Dominant left vertebral artery is patent with mild tortuosity, no stenosis to the skull base. CTA HEAD Posterior circulation: Patent distal vertebral arteries and vertebrobasilar junction with dominant left V4, diminutive non dominant right V4 segments unchanged from 2022 CTA. Both PICA origins are patent. Patent basilar artery without stenosis. Both posterior communicating arteries are present, patent SCA and PCA origins. Fetal type left PCA origin. Mildly ectatic basilar tip is stable since 2022 without discrete aneurysm. Bilateral PCA branches are stable since 2022, patent with mild irregularity throughout. Anterior circulation: Both ICA siphons are patent. Left siphon calcified plaque is mild without stenosis. Normal left posterior communicating artery origin. Right siphon calcified plaque is moderate in the cavernous segment. And appears progressed since 2022 with mild to moderate right cavernous  stenosis now (series 10, image 116). Negative supraclinoid right ICA. Patent carotid termini. MCA and ACA origins appears stable and normal. Anterior communicating artery is diminutive. Bilateral ACA branches are stable and within normal limits. Left MCA M1 segment and left MCA branches are stable since 2022, patent with only mild irregularity. Right MCA M1 segment and right MCA branches are stable since 2022 with mild irregularity. Venous sinuses: Early contrast timing, not well evaluated. Anatomic variants: Dominant left vertebral artery, fetal type left PCA origin. Review of the MIP images confirms the above findings IMPRESSION: 1. Continued stable CT appearance of the brain and CTA is negative for large vessel occlusion. 2. Positive for progressed Right ICA siphon calcified plaque since a 2022 CTA, now with mild to moderate right cavernous ICA stenosis. 3. Stable mild for age atherosclerosis otherwise. No other significant arterial stenosis. Electronically Signed   By: VEAR Hurst M.D.   On: 08/26/2023 05:52               LOS: 1 day   Johnsie Moscoso  [facility]   Pager on [email or web address]. If 7PM-7AM, please contact night-coverage at [email or web address]     08/27/2023, 4:32 PM

## 2023-08-27 NOTE — NC FL2 (Addendum)
 [place]  MEDICAID [id number] LEVEL OF CARE FORM     IDENTIFICATION  Patient Name: John Spears Birthdate: 1950-08-29 Sex: male Admission Date (Current Location): 08/26/2023  [facility] and [place] Number:  Chiropodist and Address:  [facility], [address] [place], [place] [address]      Provider Number: [id number]  Attending Physician Name and Address:  Jens Durand, MD  Relative Name and Phone Number:  Dickey Bloodgood (HCPOA/Sister) (H) [phone]    Current Level of Care: Hospital Recommended Level of Care: Skilled Nursing Facility Prior Approval Number:    Date Approved/Denied:   PASRR Number: [id number]  Discharge Plan: SNF    Current Diagnoses: Patient Active Problem List   Diagnosis Date Noted   HLD (hyperlipidemia) 07/23/2023   BPH (benign prostatic hyperplasia) 07/23/2023   Chronic diastolic CHF (congestive heart failure) (HCC) 03/09/2023   Paroxysmal atrial fibrillation (HCC) 03/09/2023   Seizure (HCC) 03/09/2023   Toxic metabolic encephalopathy 03/09/2023   Acute metabolic encephalopathy 01/26/2023   Malnutrition of moderate degree 12/24/2022   ETOH abuse 12/22/2022   Thrombocytopenia (HCC) 12/22/2022   Breakthrough seizure (HCC) 12/22/2022   CKD (chronic kidney disease), stage III (HCC) 09/25/2022   Chest congestion 04/04/2022   Chronic cough 04/04/2022   Dilated cardiomyopathy (HCC) 02/26/2022   Alcohol abuse 02/26/2022   Seizures (HCC) 02/20/2022   Transaminitis    Traumatic rhabdomyolysis (HCC)    Thrombocytopenia (HCC)    History of CVA (cerebrovascular accident) 10/22/2016   Essential hypertension 10/22/2016   Alcoholism (HCC) 10/22/2016   Cerebrovascular accident (CVA) (HCC) 04/05/2016    Orientation RESPIRATION BLADDER Height & Weight     Self, Situation, Place  Normal Incontinent Weight: 165 lb 3.2 oz (74.9 kg) Height:  5' 11 (180.3 cm)  BEHAVIORAL SYMPTOMS/MOOD NEUROLOGICAL BOWEL  NUTRITION STATUS   (None) Convulsions/Seizures Continent Diet (Heart healthy)  AMBULATORY STATUS COMMUNICATION OF NEEDS Skin   Limited Assist Verbally Normal                       Personal Care Assistance Level of Assistance  Bathing, Feeding, Dressing Bathing Assistance: Maximum assistance Feeding assistance: Limited assistance Dressing Assistance: Maximum assistance     Functional Limitations Info  Sight, Hearing, Speech Sight Info: Adequate Hearing Info: Adequate Speech Info: Adequate    SPECIAL CARE FACTORS FREQUENCY  PT (By licensed PT), OT (By licensed OT)     PT Frequency: 5 x week OT Frequency: 5 x week            Contractures Contractures Info: Not present    Additional Factors Info  Code Status, Allergies Code Status Info: Full code Allergies Info: Not on file           Current Medications (08/27/2023):  This is the current hospital active medication list Current Facility-Administered Medications  Medication Dose Route Frequency Provider Last Rate Last Admin   amLODipine  (NORVASC ) tablet 10 mg  10 mg Oral Daily Sira, Zackery, MD   10 mg at 08/27/23 0924   aspirin  EC tablet 81 mg  81 mg Oral Daily Sira, Zackery, MD   81 mg at 08/27/23 0924   atorvastatin  (LIPITOR) tablet 40 mg  40 mg Oral QHS Sira, Zackery, MD   40 mg at 08/26/23 2131   carvedilol  (COREG ) tablet 12.5 mg  12.5 mg Oral BID WC Sira, Zackery, MD   12.5 mg at 08/27/23 0924   cyanocobalamin  (VITAMIN B12) tablet 1,000 mcg  1,000  mcg Oral Daily Sira, Zackery, MD   1,000 mcg at 08/27/23 0923   folic acid  (FOLVITE ) tablet 1 mg  1 mg Oral Daily Sira, Zackery, MD   1 mg at 08/27/23 9076   lacosamide  (VIMPAT ) tablet 150 mg  150 mg Oral BID Sira, Zackery, MD   150 mg at 08/27/23 9076   levETIRAcetam  (KEPPRA ) tablet 1,500 mg  1,500 mg Oral BID Sira, Zackery, MD   1,500 mg at 08/27/23 9075   LORazepam  (ATIVAN ) injection 1 mg  1 mg Intravenous Q5 min PRN Cleatus Delayne GAILS, MD   1 mg at 08/26/23 9083    multivitamin with minerals tablet 1 tablet  1 tablet Oral Daily Sira, Zackery, MD   1 tablet at 08/27/23 9076   rivaroxaban  (XARELTO ) tablet 20 mg  20 mg Oral Q supper Sira, Zackery, MD   20 mg at 08/26/23 1733   tamsulosin  (FLOMAX ) capsule 0.4 mg  0.4 mg Oral QPC supper Sira, Zackery, MD   0.4 mg at 08/26/23 1733   thiamine  (VITAMIN B1) tablet 100 mg  100 mg Oral Daily Sira, Zackery, MD   100 mg at 08/27/23 9076     Discharge Medications: Please see discharge summary for a list of discharge medications.  Relevant Imaging Results:  Relevant Lab Results:   Additional Information SS#: [id number] Was at [facility] 6/11-6/20 (9 days).  Lauraine JAYSON Carpen, LCSW

## 2023-08-27 NOTE — Evaluation (Signed)
 Occupational Therapy Evaluation Patient Details Name: John Spears MRN: [id number] DOB: 12/02/1950 Today's Date: 08/27/2023   History of Present Illness   Pt is a 73 year old male presented for evaluation of breakthrough seizures    PMH significant for alcohol abuse, hypertension, prior stroke with mild right-sided hemiparesis at baseline, hyperlipidemia, A-fib on Xarelto , seizures with multiple episodes of breakthrough seizures due to noncompliance     Clinical Impressions Chart reviewed, pt greeted in chair with hand off from PT who amb with him in the hallway. Pt is oriented to self, place, grossly to situation. He presents with deficits in sequencing, awareness and requires multi modal cues for ADL task completion. Pt is a poor historian at this time but per chart he was recently discharged to rehab from this hospital, but lives at home with sister who assists with IADLs, he is generally MOD I for ADL. He reports he amb with no AD. PT presents with deficits in strength, endurance, activity tolerance, balance, cognition affecting safe and optimal ADL completion. STS completed with MIN A. Pt unable to sequence LB dressing with out step by step multi modal cues and continues to require MAX A for task completion. MIN A required for self feeding due to B hand tremors. Pt is left in chair, all needs met. OT will follow acutely to facilitate optimal ADL/functional mobility performance.       If plan is discharge home, recommend the following:   A little help with walking and/or transfers;A lot of help with bathing/dressing/bathroom;Help with stairs or ramp for entrance;Assistance with cooking/housework;Supervision due to cognitive status     Functional Status Assessment   Patient has had a recent decline in their functional status and demonstrates the ability to make significant improvements in function in a reasonable and predictable amount of time.     Equipment Recommendations    BSC/3in1 (pt unable to report if he has a bsc, pending progress may benefit from mwc for safer mobility in the home when he is alone)     Recommendations for Other Services         Precautions/Restrictions   Precautions Precautions: Fall Recall of Precautions/Restrictions: Impaired Restrictions Weight Bearing Restrictions Per Provider Order: No     Mobility Bed Mobility               General bed mobility comments: NT in recliner pre/post session    Transfers Overall transfer level: Needs assistance Equipment used: Rolling walker (2 wheels) Transfers: Sit to/from Stand Sit to Stand: Min assist                  Balance Overall balance assessment: Needs assistance Sitting-balance support: Feet supported Sitting balance-Leahy Scale: Good     Standing balance support: Bilateral upper extremity supported, During functional activity, Reliant on assistive device for balance Standing balance-Leahy Scale: Fair                             ADL either performed or assessed with clinical judgement   ADL Overall ADL's : Needs assistance/impaired Eating/Feeding: Minimal assistance Eating/Feeding Details (indicate cue type and reason): open package/containers due to tremors                 Lower Body Dressing: Maximal assistance;Cueing for sequencing Lower Body Dressing Details (indicate cue type and reason): poor sequencing of task, requires multi modal cues Toilet Transfer: Minimal assistance;Rolling walker (2 wheels) Toilet Transfer Details (indicate  cue type and reason): simulated Toileting- Clothing Manipulation and Hygiene: Maximal assistance;Sit to/from stand               Vision Patient Visual Report: No change from baseline       Perception         Praxis         Pertinent Vitals/Pain Pain Assessment Pain Assessment: No/denies pain     Extremity/Trunk Assessment Upper Extremity Assessment Upper Extremity Assessment:  Generalized weakness;RUE deficits/detail;LUE deficits/detail RUE Deficits / Details: generalized tremors   Lower Extremity Assessment Lower Extremity Assessment: Generalized weakness       Communication Communication Communication: Impaired Factors Affecting Communication: Difficulty expressing self;Reduced clarity of speech   Cognition Arousal: Alert Behavior During Therapy: Flat affect Cognition: History of cognitive impairments, Cognition impaired   Orientation impairments: Time Awareness: Intellectual awareness impaired, Online awareness impaired Memory impairment (select all impairments): Declarative long-term memory Attention impairment (select first level of impairment): Sustained attention Executive functioning impairment (select all impairments): Reasoning, Problem solving, Sequencing                   Following commands: Impaired Following commands impaired: Follows one step commands with increased time (and multi modal cues)     Cueing  General Comments   Cueing Techniques: Verbal cues;Gestural cues  vss throughout on RA   Exercises Other Exercises Other Exercises: edu re: role of OT, role of rehab, safe ADL completion   Shoulder Instructions      Home Living Family/patient expects to be discharged to:: Private residence Living Arrangements: Other relatives (sister) Available Help at Discharge: Family;Available 24 hours/day Type of Home: House Home Access: Stairs to enter [employer] of Steps: 3 Entrance Stairs-Rails: Right Home Layout: One level     Bathroom Shower/Tub: Producer, television/film/video: Standard Bathroom Accessibility: Yes   Home Equipment: Shower seat;Grab bars - tub/shower;Rolling Environmental consultant (2 wheels)   Additional Comments: home set up from chart- pt is a poor historian at this time      Prior Functioning/Environment Prior Level of Function : Needs assist;Patient poor historian/Family not available              Mobility Comments: pt reports amb with no AD, recent rehab stay ADLs Comments: pt reports he is MOD I-I in ADL, sister helps with IADLs    OT Problem List: Decreased strength;Impaired balance (sitting and/or standing);Decreased cognition;Decreased activity tolerance;Decreased knowledge of use of DME or AE;Decreased safety awareness;Decreased knowledge of precautions;Decreased coordination;Impaired UE functional use   OT Treatment/Interventions: Self-care/ADL training;DME and/or AE instruction;Therapeutic activities;Balance training;Therapeutic exercise;Energy conservation;Patient/family education      OT Goals(Current goals can be found in the care plan section)   Acute Rehab OT Goals Patient Stated Goal: go home OT Goal Formulation: With patient Time For Goal Achievement: 09/10/23 Potential to Achieve Goals: Fair   OT Frequency:  Min 2X/week    Co-evaluation              AM-PAC OT 6 Clicks Daily Activity     Outcome Measure Help from another person eating meals?: A Little Help from another person taking care of personal grooming?: A Little Help from another person toileting, which includes using toliet, bedpan, or urinal?: A Lot Help from another person bathing (including washing, rinsing, drying)?: A Lot Help from another person to put on and taking off regular upper body clothing?: A Little Help from another person to put on and taking off regular lower body clothing?: A  Lot 6 Click Score: 15   End of Session Equipment Utilized During Treatment: Rolling walker (2 wheels) Nurse Communication: Mobility status  Activity Tolerance: Patient tolerated treatment well Patient left: in chair;with call bell/phone within reach;with chair alarm set  OT Visit Diagnosis: Other abnormalities of gait and mobility (R26.89);Muscle weakness (generalized) (M62.81)                Time: 1005-1016 OT Time Calculation (min): 11 min Charges:  OT General Charges $OT Visit: 1  Visit OT Evaluation $OT Eval Low Complexity: 1 Low  Therisa Sheffield, OTD OTR/L  08/27/23, 10:56 AM

## 2023-08-27 NOTE — TOC Initial Note (Signed)
 Transition of Care [facility]) - Initial/Assessment Note    Patient Details  Name: John Spears MRN: [id number] Date of Birth: 08-Dec-1950  Transition of [facility]) CM/SW Contact:    Lauraine JAYSON Carpen, LCSW Phone Number: 08/27/2023, 3:40 PM  Clinical Narrative:  CSW met with patient. No family at bedside. CSW introduced role and explained that therapy recommendations would be discussed. Patient kept asking about his teeth. CSW tried redirecting him but was unsuccessful. CSW called patient's son. He is agreeable to SNF recommendation. [facility] is first preference. He was recently there from 6/11-6/20 (9 days). Discussed that he has about 11 days left that insurance would cover at 100%. CSW left message for admissions coordinator asking her to review. No further concerns. CSW will continue to follow patient and his son for support and facilitate discharge to SNF once medically stable.                Expected Discharge Plan: Skilled Nursing Facility Barriers to Discharge: Continued Medical Work up   Patient Goals and CMS Choice            Expected Discharge Plan and Services   Discharge Planning Services: CM Consult Post Acute Care Choice: Skilled Nursing Facility Living arrangements for the past 2 months: Single Family Home                                      Prior Living Arrangements/Services Living arrangements for the past 2 months: Single Family Home Lives with:: Siblings Patient language and need for interpreter reviewed:: Yes Do you feel safe going back to the place where you live?: Yes      Need for Family Participation in Patient Care: Yes (Comment) Care giver support system in place?: Yes (comment) Current home services: DME (walker, cane, bsc, [employer]) Criminal Activity/Legal Involvement Pertinent to Current Situation/Hospitalization: No - Comment as needed  Activities of Daily Living   ADL Screening (condition at time of admission) Independently performs  ADLs?: Yes (appropriate for developmental age) Is the patient deaf or have difficulty hearing?: No Does the patient have difficulty seeing, even when wearing glasses/contacts?: No Does the patient have difficulty concentrating, remembering, or making decisions?: No  Permission Sought/Granted Permission sought to share information with : Facility Medical sales representative, Family Supports    Share Information with NAME: Taylen Osorto  Permission granted to share info w AGENCY: SNF's  Permission granted to share info w Relationship: Son  Permission granted to share info w Contact Information: [phone]  Emotional Assessment Appearance:: Appears stated age Attitude/Demeanor/Rapport: Unable to Assess Affect (typically observed): Unable to Assess Orientation: : Oriented to Self, Oriented to Place, Oriented to Situation Alcohol / Substance Use: Not Applicable Psych Involvement: No (comment)  Admission diagnosis:  Seizure (HCC) [R56.9] Seizure-like activity (HCC) [R56.9] Breakthrough seizure (HCC) [G40.919] Altered mental status, unspecified altered mental status type [R41.82] Patient Active Problem List   Diagnosis Date Noted   HLD (hyperlipidemia) 07/23/2023   BPH (benign prostatic hyperplasia) 07/23/2023   Chronic diastolic CHF (congestive heart failure) (HCC) 03/09/2023   Paroxysmal atrial fibrillation (HCC) 03/09/2023   Seizure (HCC) 03/09/2023   Toxic metabolic encephalopathy 03/09/2023   Acute metabolic encephalopathy 01/26/2023   Malnutrition of moderate degree 12/24/2022   ETOH abuse 12/22/2022   Thrombocytopenia (HCC) 12/22/2022   Breakthrough seizure (HCC) 12/22/2022   CKD (chronic kidney disease), stage III (HCC) 09/25/2022   Chest congestion 04/04/2022  Chronic cough 04/04/2022   Dilated cardiomyopathy (HCC) 02/26/2022   Alcohol abuse 02/26/2022   Seizures (HCC) 02/20/2022   Transaminitis    Traumatic rhabdomyolysis (HCC)    Thrombocytopenia (HCC)    History of  CVA (cerebrovascular accident) 10/22/2016   Essential hypertension 10/22/2016   Alcoholism (HCC) 10/22/2016   Cerebrovascular accident (CVA) (HCC) 04/05/2016   PCP:  Patient, No Pcp Per Pharmacy:   [facility] - [place], [place] - [address] [place] [address] Phone: [phone] Fax: [phone]     Social Drivers of Health (SDOH) Social History: SDOH Screenings   Food Insecurity: No Food Insecurity (08/26/2023)  Housing: Low Risk  (08/26/2023)  Transportation Needs: No Transportation Needs (08/26/2023)  Utilities: Not At Risk (08/26/2023)  Financial Resource Strain: Medium Risk (04/06/2023)   Received from [facility]  Social Connections: Moderately Isolated (08/26/2023)  Tobacco Use: Medium Risk (08/26/2023)   SDOH Interventions:     Readmission Risk Interventions     No data to display

## 2023-08-27 NOTE — Discharge Instructions (Signed)
 Some PCP options in [place] area- not a comprehensive list  [facility]- [phone] [facility]- [phone] [facility]- [phone] [facility]- [phone] [facility]- [phone] [facility]- [phone]  or [facility] Physician Referral Line [phone]

## 2023-08-28 DIAGNOSIS — G40919 Epilepsy, unspecified, intractable, without status epilepticus: Secondary | ICD-10-CM | POA: Diagnosis not present

## 2023-08-28 LAB — COMPREHENSIVE METABOLIC PANEL WITH GFR
ALT: 9 U/L (ref 0–44)
AST: 18 U/L (ref 15–41)
Albumin: 3 g/dL — ABNORMAL LOW (ref 3.5–5.0)
Alkaline Phosphatase: 59 U/L (ref 38–126)
Anion gap: 9 (ref 5–15)
BUN: 13 mg/dL (ref 8–23)
CO2: 25 mmol/L (ref 22–32)
Calcium: 9.2 mg/dL (ref 8.9–10.3)
Chloride: 105 mmol/L (ref 98–111)
Creatinine, Ser: 0.88 mg/dL (ref 0.61–1.24)
GFR, Estimated: >60 mL/min (ref >60)
Glucose, Bld: 88 mg/dL (ref 70–99)
Potassium: 3.6 mmol/L (ref 3.5–5.1)
Sodium: 139 mmol/L (ref 135–145)
Total Bilirubin: 0.6 mg/dL (ref 0.0–1.2)
Total Protein: 6.1 g/dL — ABNORMAL LOW (ref 6.5–8.1)

## 2023-08-28 LAB — CBC
HCT: 27.1 % — ABNORMAL LOW (ref 39.0–52.0)
Hemoglobin: 8.9 g/dL — ABNORMAL LOW (ref 13.0–17.0)
MCH: 28.6 pg (ref 26.0–34.0)
MCHC: 32.8 g/dL (ref 30.0–36.0)
MCV: 87.1 fL (ref 80.0–100.0)
Platelets: 160 K/uL (ref 150–400)
RBC: 3.11 MIL/uL — ABNORMAL LOW (ref 4.22–5.81)
RDW: 15.9 % — ABNORMAL HIGH (ref 11.5–15.5)
WBC: 5.4 K/uL (ref 4.0–10.5)
nRBC: 0 % (ref 0.0–0.2)

## 2023-08-28 LAB — PHOSPHORUS: Phosphorus: 3.2 mg/dL (ref 2.5–4.6)

## 2023-08-28 LAB — MAGNESIUM: Magnesium: 1.8 mg/dL (ref 1.7–2.4)

## 2023-08-28 LAB — C-REACTIVE PROTEIN: CRP: 0.8 mg/dL (ref <1.0)

## 2023-08-28 LAB — LEVETIRACETAM LEVEL: Levetiracetam Lvl: 85.9 ug/mL — ABNORMAL HIGH (ref 10.0–40.0)

## 2023-08-28 NOTE — Progress Notes (Addendum)
 Progress Note    John Spears  FMW:[id number] DOB: 1950/09/02  DOA: 08/26/2023 PCP: Patient, No Pcp Per      Brief Narrative:    Medical records reviewed and are as summarized below:  John Spears is a 73 y.o. male with medical history significant for prior stroke with mild right-sided hemiparesis, hyperlipidemia, atrial fibrillation on Xarelto , seizures with multiple episodes of breakthrough seizures, medical nonadherence, alcohol use disorder, who presented to the hospital for evaluation of breakthrough seizures. Reportedly, his sister heard a popping sound from his room so she went to check on him.  On arrival, she noticed that patient's whole body was stiff and shaking.  Patient also had urinary incontinence. Apparently there was an open container of beer in his room.    Prior to admission, he was on Keppra  1500 mg twice daily and Vimpat  150 mg twice daily.        Assessment/Plan:   Principal Problem:   Breakthrough seizure (HCC) Active Problems:   Seizure (HCC)    Body mass index is 23.04 kg/m.   Breakthrough seizures in a patient with seizure disorder: He had 2 episodes of back-to-back brief jerking movements reported by nursing staff today, each lasting less than a minute.  Case was discussed with Dr. Matthews, neurologist.  Continue Keppra  1500 mg twice daily and Vimpat  200 mg twice daily (increased from 150 mg twice daily). Seizure precautions. No evidence of acute stroke on limited MRI brain.   History of stroke with mild right-sided hemiparesis: Continue aspirin  and Lipitor   Hypomagnesemia: Improved   Paroxysmal atrial fibrillation: Continue Xarelto    General Weakness: PT recommended discharge to SNF   Comorbidities include alcohol use disorder, BPH, chronic anemia, hypertension   Plan was for transfer patient from progressive care unit to medical telemetry.  However, transfer has been canceled for now.  Plan of care was discussed  with Dickey, sister, over the phone.  Clarified CODE STATUS with Dickey.  Patient is a full code.  CODE STATUS updated in EMR.    Diet Order             Diet Heart Room service appropriate? Yes; Fluid consistency: Thin  Diet effective now                            Consultants: Neurologist  Procedures: None    Medications:    amLODipine   10 mg Oral Daily   aspirin  EC  81 mg Oral Daily   atorvastatin   40 mg Oral QHS   carvedilol   12.5 mg Oral BID WC   vitamin B-12  1,000 mcg Oral Daily   folic acid   1 mg Oral Daily   lacosamide   200 mg Oral BID   levETIRAcetam   1,500 mg Oral BID   multivitamin with minerals  1 tablet Oral Daily   rivaroxaban   20 mg Oral Q supper   tamsulosin   0.4 mg Oral QPC supper   thiamine   100 mg Oral Daily   Continuous Infusions:   Anti-infectives (From admission, onward)    None              Family Communication/Anticipated D/C date and plan/Code Status   DVT prophylaxis:  rivaroxaban  (XARELTO ) tablet 20 mg     Code Status: Full Code. CODE STATUS clarified with Dickey, patient's sister.  She states he is still a full code.  Family Communication: Plan of care was discussed with  Dickey, sister, over the phone. Disposition Plan: Plan to discharge to SNF versus home   Status is: Inpatient Remains inpatient appropriate because: Breakthrough seizures       Subjective:   Interval events noted.  He has no complaints.  Later in the day, nursing staff reported the patient had 2 episodes of brief jerky movements (when they tried to get him up to go to the bathroom), each episode lasted for about less than 1 minute  Objective:    Vitals:   08/27/23 2331 08/28/23 0322 08/28/23 0738 08/28/23 1210  BP: 139/61 122/68 136/62 132/68  Pulse: 60 (!) 59 (!) 57 65  Resp: 16 18 16 16   Temp: 98 F (36.7 C) (!) 97.5 F (36.4 C) 98.7 F (37.1 C) 99.1 F (37.3 C)  TempSrc: Oral  Oral Oral  SpO2: 97% 97% 98% 100%  Weight:       Height:       No data found.   Intake/Output Summary (Last 24 hours) at 08/28/2023 1417 Last data filed at 08/28/2023 0900 Gross per 24 hour  Intake 720 ml  Output 920 ml  Net -200 ml   Filed Weights   08/26/23 0239  Weight: 74.9 kg    Exam:  GEN: NAD SKIN: Warm and dry EYES: No pallor or icterus ENT: MMM CV: RRR PULM: CTA B ABD: soft, ND, NT, +BS CNS: AAO x 2 (person and place), non focal EXT: No edema or tenderness        Data Reviewed:   I have personally reviewed following labs and imaging studies:  Labs: Labs show the following:   Basic Metabolic Panel: Recent Labs  Lab 08/26/23 0247 08/26/23 0857 08/27/23 0352 08/28/23 0418  NA 138 139 139 139  K 3.8 4.6 3.8 3.6  CL 107 103 105 105  CO2 24 25 26 25   GLUCOSE 129* 159* 93 88  BUN 22 19 14 13   CREATININE 1.15 1.05 0.75 0.88  CALCIUM  8.9 9.1 9.1 9.2  MG 1.6* 2.1 2.0 1.8  PHOS 2.6  --  2.9 3.2   GFR Estimated Creatinine Clearance: 81.6 mL/min (by C-G formula based on SCr of 0.88 mg/dL). Liver Function Tests: Recent Labs  Lab 08/26/23 0247 08/26/23 0857 08/27/23 0352 08/28/23 0418  AST 30 23 18 18   ALT 13 12 9 9   ALKPHOS 81 81 64 59  BILITOT 0.6 0.7 0.8 0.6  PROT 6.1* 6.5 6.2* 6.1*  ALBUMIN 3.2* 3.4* 3.1* 3.0*   No results for input(s): LIPASE, AMYLASE in the last 168 hours. No results for input(s): AMMONIA in the last 168 hours. Coagulation profile Recent Labs  Lab 08/26/23 0247 08/26/23 0857 08/27/23 0352  INR 2.7* 1.8* 2.1*    CBC: Recent Labs  Lab 08/26/23 0247 08/27/23 0352 08/28/23 0418  WBC 5.7 5.4 5.4  NEUTROABS 3.6  --   --   HGB 9.4* 9.2* 8.9*  HCT 28.8* 28.3* 27.1*  MCV 87.5 86.5 87.1  PLT 164 157 160   Cardiac Enzymes: Recent Labs  Lab 08/26/23 0857  CKTOTAL 58   BNP (last 3 results) No results for input(s): PROBNP in the last 8760 hours. CBG: Recent Labs  Lab 08/26/23 0252  GLUCAP 129*   D-Dimer: No results for input(s): DDIMER in  the last 72 hours. Hgb A1c: Recent Labs    08/27/23 0352  HGBA1C 4.7*   Lipid Profile: No results for input(s): CHOL, HDL, LDLCALC, TRIG, CHOLHDL, LDLDIRECT in the last 72 hours. Thyroid function studies: Recent  Labs    08/27/23 0352  TSH 0.884   Anemia work up: No results for input(s): VITAMINB12, FOLATE, FERRITIN, TIBC, IRON , RETICCTPCT in the last 72 hours. Sepsis Labs: Recent Labs  Lab 08/26/23 0247 08/27/23 0352 08/28/23 0418  WBC 5.7 5.4 5.4    Microbiology No results found for this or any previous visit (from the past 240 hours).  Procedures and diagnostic studies:  No results found.              LOS: 2 days   Crosby Bevan  [facility]   Pager on [email or web address]. If 7PM-7AM, please contact night-coverage at [email or web address]     08/28/2023, 2:17 PM

## 2023-08-28 NOTE — Progress Notes (Signed)
 Patient has had multiple events today witnessed by RN described as rhythmic jerking of his RUE without alteration in mental status c/w patient's hx focal motor seizures of LUE. Given that these episodes self-resolved within 30-60 seconds would not recommend changes to AEDs at this time. Do no recommend tx with ativan  unless seizure >5 min, generalized, or associated with unstable vital signs.  Elida Ross, MD [facility] [phone]  If 7pm- 7am, please page neurology on call as listed in AMION.

## 2023-08-28 NOTE — Progress Notes (Addendum)
 12:45 _ Got patient standing up,  after that patient had several episodes of jerky seizure like episodes that did not last more than 1 minute, MD made of seizure episodes, MD made aware of, orders to keep monitoring, seizures were brief focal motor seizures lasting 1 min or less, no changes were made, no aggressive with isolated focal motor seizures with intact awareness. Inform the doctor if any seizure lasting >5 min before then or if he generalizes.   17:25 - Patient has been having several seizure like activities frequently, they do not last longer than 1 minutes, these activity get better after sitting or lying down on the bed, MD made aware about, no orders.  18:30 - Hold off on Ativan  if seizures lasting <58minutes.

## 2023-08-28 NOTE — Plan of Care (Signed)
  Problem: Education: Goal: Knowledge of General Education information will improve Description: Including pain rating scale, medication(s)/side effects and non-pharmacologic comfort measures Outcome: Progressing   Problem: Activity: Goal: Risk for activity intolerance will decrease Outcome: Progressing   Problem: Elimination: Goal: Will not experience complications related to urinary retention Outcome: Progressing   Problem: Safety: Goal: Ability to remain free from injury will improve Outcome: Progressing

## 2023-08-29 DIAGNOSIS — G40919 Epilepsy, unspecified, intractable, without status epilepticus: Secondary | ICD-10-CM | POA: Diagnosis not present

## 2023-08-29 LAB — CBC
HCT: 28.3 % — ABNORMAL LOW (ref 39.0–52.0)
Hemoglobin: 9.1 g/dL — ABNORMAL LOW (ref 13.0–17.0)
MCH: 27.9 pg (ref 26.0–34.0)
MCHC: 32.2 g/dL (ref 30.0–36.0)
MCV: 86.8 fL (ref 80.0–100.0)
Platelets: 158 K/uL (ref 150–400)
RBC: 3.26 MIL/uL — ABNORMAL LOW (ref 4.22–5.81)
RDW: 15.7 % — ABNORMAL HIGH (ref 11.5–15.5)
WBC: 6.5 K/uL (ref 4.0–10.5)
nRBC: 0 % (ref 0.0–0.2)

## 2023-08-29 LAB — COMPREHENSIVE METABOLIC PANEL WITH GFR
ALT: 9 U/L (ref 0–44)
AST: 18 U/L (ref 15–41)
Albumin: 3 g/dL — ABNORMAL LOW (ref 3.5–5.0)
Alkaline Phosphatase: 56 U/L (ref 38–126)
Anion gap: 7 (ref 5–15)
BUN: 14 mg/dL (ref 8–23)
CO2: 25 mmol/L (ref 22–32)
Calcium: 9.1 mg/dL (ref 8.9–10.3)
Chloride: 105 mmol/L (ref 98–111)
Creatinine, Ser: 1.01 mg/dL (ref 0.61–1.24)
GFR, Estimated: >60 mL/min (ref >60)
Glucose, Bld: 85 mg/dL (ref 70–99)
Potassium: 3.7 mmol/L (ref 3.5–5.1)
Sodium: 137 mmol/L (ref 135–145)
Total Bilirubin: 0.7 mg/dL (ref 0.0–1.2)
Total Protein: 6.1 g/dL — ABNORMAL LOW (ref 6.5–8.1)

## 2023-08-29 LAB — PHOSPHORUS: Phosphorus: 3.3 mg/dL (ref 2.5–4.6)

## 2023-08-29 LAB — MAGNESIUM: Magnesium: 1.7 mg/dL (ref 1.7–2.4)

## 2023-08-29 LAB — C-REACTIVE PROTEIN: CRP: 0.6 mg/dL (ref <1.0)

## 2023-08-29 MED ORDER — ORAL CARE MOUTH RINSE: 15.0000 mL | OROMUCOSAL | Status: DC | PRN

## 2023-08-29 NOTE — Progress Notes (Signed)
 Progress Note    John Spears  FMW:[id number] DOB: Sep 05, 1950  DOA: 08/26/2023 PCP: Patient, No Pcp Per      Brief Narrative:    Medical records reviewed and are as summarized below:  John Spears is a 73 y.o. male with medical history significant for prior stroke with mild right-sided hemiparesis, hyperlipidemia, atrial fibrillation on Xarelto , seizures with multiple episodes of breakthrough seizures, medical nonadherence, alcohol use disorder, who presented to the hospital for evaluation of breakthrough seizures. Reportedly, his sister heard a popping sound from his room so she went to check on him.  On arrival, she noticed that patient's whole body was stiff and shaking.  Patient also had urinary incontinence. Apparently there was an open container of beer in his room.    Prior to admission, he was on Keppra  1500 mg twice daily and Vimpat  150 mg twice daily.        Assessment/Plan:   Principal Problem:   Breakthrough seizure (HCC) Active Problems:   Seizure (HCC)    Body mass index is 23.04 kg/m.   Breakthrough seizures in a patient with seizure disorder, recurrent, brief jerking movements lasting less than 1 minute:  Continue Keppra  1500 mg twice daily and Vimpat  200 mg twice daily (increased from 150 mg twice daily). Seizure precautions. No evidence of acute stroke on limited MRI brain.   History of stroke with mild right-sided hemiparesis: Continue aspirin  and Lipitor   Hypomagnesemia: Improved   Paroxysmal atrial fibrillation: Continue Xarelto    General Weakness: PT recommended discharge to SNF   Comorbidities include alcohol use disorder, BPH, chronic anemia, hypertension       Diet Order             Diet Heart Room service appropriate? Yes; Fluid consistency: Thin  Diet effective now                            Consultants: Neurologist  Procedures: None    Medications:    amLODipine   10 mg Oral Daily    aspirin  EC  81 mg Oral Daily   atorvastatin   40 mg Oral QHS   carvedilol   12.5 mg Oral BID WC   vitamin B-12  1,000 mcg Oral Daily   folic acid   1 mg Oral Daily   lacosamide   200 mg Oral BID   levETIRAcetam   1,500 mg Oral BID   multivitamin with minerals  1 tablet Oral Daily   rivaroxaban   20 mg Oral Q supper   tamsulosin   0.4 mg Oral QPC supper   thiamine   100 mg Oral Daily   Continuous Infusions:   Anti-infectives (From admission, onward)    None              Family Communication/Anticipated D/C date and plan/Code Status   DVT prophylaxis:  rivaroxaban  (XARELTO ) tablet 20 mg     Code Status: Full Code.   Family Communication: None  Disposition Plan: Plan to discharge to SNF versus home   Status is: Inpatient Remains inpatient appropriate because: Breakthrough seizures       Subjective:   Interval events noted.  He has no complaints.  Objective:    Vitals:   08/28/23 2357 08/29/23 0400 08/29/23 0814 08/29/23 1207  BP: 139/71 130/62 138/74 120/72  Pulse: (!) 59 60 76 62  Resp: 20 20    Temp: 98.5 F (36.9 C) 98.8 F (37.1 C) 97.6 F (36.4 C) 98.5  F (36.9 C)  TempSrc:      SpO2: 100% 100% 100% 100%  Weight:      Height:       No data found.   Intake/Output Summary (Last 24 hours) at 08/29/2023 1428 Last data filed at 08/29/2023 0817 Gross per 24 hour  Intake 480 ml  Output 750 ml  Net -270 ml   Filed Weights   08/26/23 0239  Weight: 74.9 kg    Exam:   GEN: NAD SKIN: Warm and dry EYES: Anicteric ENT: MMM CV: RRR PULM: CTA B ABD: soft, ND, NT, +BS CNS: AAO x 2 (person and place), non focal EXT: No edema or tenderness      Data Reviewed:   I have personally reviewed following labs and imaging studies:  Labs: Labs show the following:   Basic Metabolic Panel: Recent Labs  Lab 08/26/23 0247 08/26/23 0857 08/27/23 0352 08/28/23 0418 08/29/23 0323  NA 138 139 139 139 137  K 3.8 4.6 3.8 3.6 3.7  CL 107 103  105 105 105  CO2 24 25 26 25 25   GLUCOSE 129* 159* 93 88 85  BUN 22 19 14 13 14   CREATININE 1.15 1.05 0.75 0.88 1.01  CALCIUM  8.9 9.1 9.1 9.2 9.1  MG 1.6* 2.1 2.0 1.8 1.7  PHOS 2.6  --  2.9 3.2 3.3   GFR Estimated Creatinine Clearance: 71.1 mL/min (by C-G formula based on SCr of 1.01 mg/dL). Liver Function Tests: Recent Labs  Lab 08/26/23 0247 08/26/23 0857 08/27/23 0352 08/28/23 0418 08/29/23 0323  AST 30 23 18 18 18   ALT 13 12 9 9 9   ALKPHOS 81 81 64 59 56  BILITOT 0.6 0.7 0.8 0.6 0.7  PROT 6.1* 6.5 6.2* 6.1* 6.1*  ALBUMIN 3.2* 3.4* 3.1* 3.0* 3.0*   No results for input(s): LIPASE, AMYLASE in the last 168 hours. No results for input(s): AMMONIA in the last 168 hours. Coagulation profile Recent Labs  Lab 08/26/23 0247 08/26/23 0857 08/27/23 0352  INR 2.7* 1.8* 2.1*    CBC: Recent Labs  Lab 08/26/23 0247 08/27/23 0352 08/28/23 0418 08/29/23 0323  WBC 5.7 5.4 5.4 6.5  NEUTROABS 3.6  --   --   --   HGB 9.4* 9.2* 8.9* 9.1*  HCT 28.8* 28.3* 27.1* 28.3*  MCV 87.5 86.5 87.1 86.8  PLT 164 157 160 158   Cardiac Enzymes: Recent Labs  Lab 08/26/23 0857  CKTOTAL 58   BNP (last 3 results) No results for input(s): PROBNP in the last 8760 hours. CBG: Recent Labs  Lab 08/26/23 0252  GLUCAP 129*   D-Dimer: No results for input(s): DDIMER in the last 72 hours. Hgb A1c: Recent Labs    08/27/23 0352  HGBA1C 4.7*   Lipid Profile: No results for input(s): CHOL, HDL, LDLCALC, TRIG, CHOLHDL, LDLDIRECT in the last 72 hours. Thyroid function studies: Recent Labs    08/27/23 0352  TSH 0.884   Anemia work up: No results for input(s): VITAMINB12, FOLATE, FERRITIN, TIBC, IRON , RETICCTPCT in the last 72 hours. Sepsis Labs: Recent Labs  Lab 08/26/23 0247 08/27/23 0352 08/28/23 0418 08/29/23 0323  WBC 5.7 5.4 5.4 6.5    Microbiology No results found for this or any previous visit (from the past 240 hours).  Procedures  and diagnostic studies:  No results found.              LOS: 3 days   John Spears  [facility]   Pager on [email or web address]. If 7PM-7AM,  please contact night-coverage at [email or web address]     08/29/2023, 2:28 PM

## 2023-08-29 NOTE — Plan of Care (Signed)

## 2023-08-29 NOTE — Plan of Care (Signed)
  Problem: Clinical Measurements: Goal: Will remain free from infection Outcome: Progressing Goal: Diagnostic test results will improve Outcome: Progressing   Problem: Nutrition: Goal: Adequate nutrition will be maintained Outcome: Progressing   Problem: Skin Integrity: Goal: Risk for impaired skin integrity will decrease Outcome: Progressing   

## 2023-08-30 DIAGNOSIS — G40919 Epilepsy, unspecified, intractable, without status epilepticus: Secondary | ICD-10-CM | POA: Diagnosis not present

## 2023-08-30 LAB — COMPREHENSIVE METABOLIC PANEL WITH GFR
ALT: 11 U/L (ref 0–44)
AST: 18 U/L (ref 15–41)
Albumin: 3.1 g/dL — ABNORMAL LOW (ref 3.5–5.0)
Alkaline Phosphatase: 59 U/L (ref 38–126)
Anion gap: 8 (ref 5–15)
BUN: 18 mg/dL (ref 8–23)
CO2: 25 mmol/L (ref 22–32)
Calcium: 9.2 mg/dL (ref 8.9–10.3)
Chloride: 103 mmol/L (ref 98–111)
Creatinine, Ser: 1.17 mg/dL (ref 0.61–1.24)
GFR, Estimated: >60 mL/min (ref >60)
Glucose, Bld: 98 mg/dL (ref 70–99)
Potassium: 3.9 mmol/L (ref 3.5–5.1)
Sodium: 136 mmol/L (ref 135–145)
Total Bilirubin: 0.4 mg/dL (ref 0.0–1.2)
Total Protein: 6 g/dL — ABNORMAL LOW (ref 6.5–8.1)

## 2023-08-30 LAB — CBC
HCT: 28.8 % — ABNORMAL LOW (ref 39.0–52.0)
Hemoglobin: 9.5 g/dL — ABNORMAL LOW (ref 13.0–17.0)
MCH: 28.9 pg (ref 26.0–34.0)
MCHC: 33 g/dL (ref 30.0–36.0)
MCV: 87.5 fL (ref 80.0–100.0)
Platelets: 170 K/uL (ref 150–400)
RBC: 3.29 MIL/uL — ABNORMAL LOW (ref 4.22–5.81)
RDW: 15.4 % (ref 11.5–15.5)
WBC: 5.5 K/uL (ref 4.0–10.5)
nRBC: 0 % (ref 0.0–0.2)

## 2023-08-30 LAB — PHOSPHORUS: Phosphorus: 3.9 mg/dL (ref 2.5–4.6)

## 2023-08-30 LAB — MAGNESIUM: Magnesium: 1.8 mg/dL (ref 1.7–2.4)

## 2023-08-30 LAB — C-REACTIVE PROTEIN: CRP: 0.7 mg/dL (ref <1.0)

## 2023-08-30 NOTE — TOC Progression Note (Signed)
 Transition of Care [facility]) - Progression Note    Patient Details  Name: John Spears MRN: [id number] Date of Birth: [birth year]  [facility] CM/SW Contact  Tomasa JAYSON Childes, RN Phone Number: 08/30/2023, 3:15 PM  Clinical Narrative:    Attempt to reach patient's son, [name]. No answer. Left a message.   Mayme auth started for [facility].       Expected Discharge Plan: Skilled Nursing Facility Barriers to Discharge: Continued Medical Work up  Expected Discharge Plan and Services   Discharge Planning Services: CM Consult Post Acute Care Choice: Skilled Nursing Facility Living arrangements for the past 2 months: Single Family Home                                       Social Determinants of Health (SDOH) Interventions SDOH Screenings   Food Insecurity: No Food Insecurity (08/26/2023)  Housing: Low Risk  (08/26/2023)  Transportation Needs: No Transportation Needs (08/26/2023)  Utilities: Not At Risk (08/26/2023)  Financial Resource Strain: Medium Risk (04/06/2023)   Received from [facility]  Social Connections: Moderately Isolated (08/26/2023)  Tobacco Use: Medium Risk (08/26/2023)    Readmission Risk Interventions     No data to display

## 2023-08-30 NOTE — Progress Notes (Signed)
 Physical Therapy Treatment Patient Details Name: John Spears MRN: [id number] DOB: 25-Sep-1950 Today's Date: 08/30/2023   History of Present Illness Pt is a 73 year old male presented for evaluation of breakthrough seizures    PMH significant for alcohol abuse, hypertension, prior stroke with mild right-sided hemiparesis at baseline, hyperlipidemia, A-fib on Xarelto , seizures with multiple episodes of breakthrough seizures due to noncompliance    PT Comments  Patient alert, agreeable to mobility with some encouragement. Denied pain. Pt needed extended time to complete bed mobility, reliant on bed rail. Sit <> stand with RW and CGA, extra time to steady on his feet. He was limited in distance today due to fatigue and feeling light headed. BP and HR WFLs. Noted for step to gait, very shuffled step. Pt up in recliner with needs in reach. The patient would benefit from further skilled PT intervention to continue to progress towards goals.    If plan is discharge home, recommend the following: A little help with walking and/or transfers;Assist for transportation;Help with stairs or ramp for entrance;Assistance with cooking/housework   Can travel by private vehicle     Yes  Equipment Recommendations  None recommended by PT    Recommendations for Other Services       Precautions / Restrictions Precautions Precautions: Fall Recall of Precautions/Restrictions: Impaired Restrictions Weight Bearing Restrictions Per Provider Order: No     Mobility  Bed Mobility Overal bed mobility: Needs Assistance Bed Mobility: Supine to Sit     Supine to sit: Supervision, HOB elevated, Used rails     General bed mobility comments: very effortful, extra to complete on his own    Transfers Overall transfer level: Needs assistance Equipment used: Rolling walker (2 wheels) Transfers: Sit to/from Stand Sit to Stand: Contact guard assist                Ambulation/Gait Ambulation/Gait  assistance: Contact guard assist Gait Distance (Feet): 25 Feet Assistive device: Rolling walker (2 wheels)         General Gait Details: with RW throughout, pt shaky, endorsed light headendess. limited by fatigue as well   Stairs             Wheelchair Mobility     Tilt Bed    Modified Rankin (Stroke Patients Only)       Balance Overall balance assessment: Needs assistance Sitting-balance support: Feet supported Sitting balance-Leahy Scale: Fair     Standing balance support: No upper extremity supported Standing balance-Leahy Scale: Poor                              Communication Communication Communication: Impaired  Cognition Arousal: Alert Behavior During Therapy: Flat affect   PT - Cognitive impairments: Safety/Judgement, No family/caregiver present to determine baseline                         Following commands: Impaired Following commands impaired: Follows one step commands with increased time    Cueing Cueing Techniques: Verbal cues, Gestural cues  Exercises      General Comments        Pertinent Vitals/Pain Pain Assessment Pain Assessment: No/denies pain    Home Living                          Prior Function            PT Goals (  current goals can now be found in the care plan section) Progress towards PT goals: Progressing toward goals    Frequency    Min 3X/week      PT Plan      Co-evaluation              AM-PAC PT 6 Clicks Mobility   Outcome Measure  Help needed turning from your back to your side while in a flat bed without using bedrails?: A Little Help needed moving from lying on your back to sitting on the side of a flat bed without using bedrails?: A Little Help needed moving to and from a bed to a chair (including a wheelchair)?: A Little Help needed standing up from a chair using your arms (e.g., wheelchair or bedside chair)?: A Little Help needed to walk in hospital  room?: A Lot Help needed climbing 3-5 steps with a railing? : A Lot 6 Click Score: 16    End of Session Equipment Utilized During Treatment: Gait belt Activity Tolerance: Patient tolerated treatment well Patient left: in chair;with chair alarm set;with call bell/phone within reach Nurse Communication: Mobility status PT Visit Diagnosis: Unsteadiness on feet (R26.81);Other abnormalities of gait and mobility (R26.89);Muscle weakness (generalized) (M62.81);Difficulty in walking, not elsewhere classified (R26.2)     Time: 8883-8867 PT Time Calculation (min) (ACUTE ONLY): 16 min  Charges:    $Therapeutic Activity: 8-22 mins PT General Charges $$ ACUTE PT VISIT: 1 Visit                     Doyal Shams PT, DPT 11:59 AM,08/30/23

## 2023-08-30 NOTE — Care Management Important Message (Signed)
 Important Message  Patient Details  Name: John Spears MRN: [id number] Date of Birth: [birth year]   Important Message Given:  Yes - Medicare IM     John Spears 08/30/2023, 2:48 PM

## 2023-08-30 NOTE — Plan of Care (Signed)

## 2023-08-30 NOTE — Progress Notes (Signed)
 Progress Note    John Spears  FMW:[id number] DOB: [birth year]  DOA: 08/26/2023 PCP: Patient, No Pcp Per      Brief Narrative:    Medical records reviewed and are as summarized below:  John Spears is a 73 y.o. male with medical history significant for prior stroke with mild right-sided hemiparesis, hyperlipidemia, atrial fibrillation on Xarelto , seizures with multiple episodes of breakthrough seizures, medical nonadherence, alcohol use disorder, who presented to the hospital for evaluation of breakthrough seizures. Reportedly, his sister heard a popping sound from his room so she went to check on him.  On arrival, she noticed that patient's whole body was stiff and shaking.  Patient also had urinary incontinence. Apparently there was an open container of beer in his room.    Prior to admission, he was on Keppra  1500 mg twice daily and Vimpat  150 mg twice daily.        Assessment/Plan:   Principal Problem:   Breakthrough seizure (HCC) Active Problems:   Seizure (HCC)    Body mass index is 23.04 kg/m.   Breakthrough seizures in a patient with seizure disorder, recurrent, brief jerking movements lasting less than 1 minute:  Continue Keppra  1500 mg twice daily and Vimpat  200 mg twice daily (increased from 150 mg twice daily). Seizure precautions. No evidence of acute stroke on limited MRI brain.   History of stroke with mild right-sided hemiparesis: Continue aspirin  and Lipitor   Hypomagnesemia: Improved   Paroxysmal atrial fibrillation: Continue Xarelto    General Weakness: PT recommended discharge to SNF   Comorbidities include alcohol use disorder, BPH, chronic anemia, hypertension   Medically stable for discharge.  Awaiting placement to SNF.    Diet Order             Diet Heart Room service appropriate? Yes; Fluid consistency: Thin  Diet effective now                             Consultants: Neurologist  Procedures: None    Medications:    amLODipine   10 mg Oral Daily   aspirin  EC  81 mg Oral Daily   atorvastatin   40 mg Oral QHS   carvedilol   12.5 mg Oral BID WC   vitamin B-12  1,000 mcg Oral Daily   folic acid   1 mg Oral Daily   lacosamide   200 mg Oral BID   levETIRAcetam   1,500 mg Oral BID   multivitamin with minerals  1 tablet Oral Daily   rivaroxaban   20 mg Oral Q supper   tamsulosin   0.4 mg Oral QPC supper   thiamine   100 mg Oral Daily   Continuous Infusions:   Anti-infectives (From admission, onward)    None              Family Communication/Anticipated D/C date and plan/Code Status   DVT prophylaxis:  rivaroxaban  (XARELTO ) tablet 20 mg     Code Status: Full Code.   Family Communication: None  Disposition Plan: Plan to discharge to SNF versus home   Status is: Inpatient Remains inpatient appropriate because: Breakthrough seizures       Subjective:   No acute events reported.  He has no complaints.  He was eating breakfast when I walked into his room this morning.  He was feeding himself.  Objective:    Vitals:   08/30/23 0442 08/30/23 0841 08/30/23 1153 08/30/23 1555  BP: 130/67 (!) 144/67 125/62 112/65  Pulse: (!) 51 (!) 53 (!) 58 (!) 57  Resp: 18     Temp: 97.7 F (36.5 C) (!) 97.4 F (36.3 C) 97.7 F (36.5 C) 98.2 F (36.8 C)  TempSrc:      SpO2: 100% 100% 100% 100%  Weight:      Height:       No data found.   Intake/Output Summary (Last 24 hours) at 08/30/2023 1604 Last data filed at 08/30/2023 0442 Gross per 24 hour  Intake 480 ml  Output 750 ml  Net -270 ml   Filed Weights   08/26/23 0239  Weight: 74.9 kg    Exam:  GEN: NAD SKIN: Warm and dry EYES: No pallor or icterus ENT: MMM CV: RRR PULM: CTA B ABD: soft, ND, NT, +BS CNS: AAO x 2 (person and place), non focal EXT: No edema or tenderness       Data Reviewed:   I have personally reviewed  following labs and imaging studies:  Labs: Labs show the following:   Basic Metabolic Panel: Recent Labs  Lab 08/26/23 0247 08/26/23 0857 08/27/23 0352 08/28/23 0418 08/29/23 0323 08/30/23 0236  NA 138 139 139 139 137 136  K 3.8 4.6 3.8 3.6 3.7 3.9  CL 107 103 105 105 105 103  CO2 24 25 26 25 25 25   GLUCOSE 129* 159* 93 88 85 98  BUN 22 19 14 13 14 18   CREATININE 1.15 1.05 0.75 0.88 1.01 1.17  CALCIUM  8.9 9.1 9.1 9.2 9.1 9.2  MG 1.6* 2.1 2.0 1.8 1.7 1.8  PHOS 2.6  --  2.9 3.2 3.3 3.9   GFR Estimated Creatinine Clearance: 61.3 mL/min (by C-G formula based on SCr of 1.17 mg/dL). Liver Function Tests: Recent Labs  Lab 08/26/23 0857 08/27/23 0352 08/28/23 0418 08/29/23 0323 08/30/23 0236  AST 23 18 18 18 18   ALT 12 9 9 9 11   ALKPHOS 81 64 59 56 59  BILITOT 0.7 0.8 0.6 0.7 0.4  PROT 6.5 6.2* 6.1* 6.1* 6.0*  ALBUMIN 3.4* 3.1* 3.0* 3.0* 3.1*   No results for input(s): LIPASE, AMYLASE in the last 168 hours. No results for input(s): AMMONIA in the last 168 hours. Coagulation profile Recent Labs  Lab 08/26/23 0247 08/26/23 0857 08/27/23 0352  INR 2.7* 1.8* 2.1*    CBC: Recent Labs  Lab 08/26/23 0247 08/27/23 0352 08/28/23 0418 08/29/23 0323 08/30/23 0236  WBC 5.7 5.4 5.4 6.5 5.5  NEUTROABS 3.6  --   --   --   --   HGB 9.4* 9.2* 8.9* 9.1* 9.5*  HCT 28.8* 28.3* 27.1* 28.3* 28.8*  MCV 87.5 86.5 87.1 86.8 87.5  PLT 164 157 160 158 170   Cardiac Enzymes: Recent Labs  Lab 08/26/23 0857  CKTOTAL 58   BNP (last 3 results) No results for input(s): PROBNP in the last 8760 hours. CBG: Recent Labs  Lab 08/26/23 0252  GLUCAP 129*   D-Dimer: No results for input(s): DDIMER in the last 72 hours. Hgb A1c: No results for input(s): HGBA1C in the last 72 hours.  Lipid Profile: No results for input(s): CHOL, HDL, LDLCALC, TRIG, CHOLHDL, LDLDIRECT in the last 72 hours. Thyroid function studies: No results for input(s): TSH,  T4TOTAL, T3FREE, THYROIDAB in the last 72 hours.  Invalid input(s): FREET3  Anemia work up: No results for input(s): VITAMINB12, FOLATE, FERRITIN, TIBC, IRON , RETICCTPCT in the last 72 hours. Sepsis Labs: Recent Labs  Lab 08/27/23 0352 08/28/23 0418 08/29/23 0323 08/30/23 0236  WBC  5.4 5.4 6.5 5.5    Microbiology No results found for this or any previous visit (from the past 240 hours).  Procedures and diagnostic studies:  No results found.              LOS: 4 days   John Spears  [facility]   Pager on [email or web address]. If 7PM-7AM, please contact night-coverage at [email or web address]     08/30/2023, 4:04 PM

## 2023-08-31 DIAGNOSIS — G40919 Epilepsy, unspecified, intractable, without status epilepticus: Secondary | ICD-10-CM | POA: Diagnosis not present

## 2023-08-31 MED ORDER — LACOSAMIDE 200 MG PO TABS: 200.0000 mg | ORAL_TABLET | Freq: Two times a day (BID) | ORAL | Status: DC

## 2023-08-31 MED ORDER — ADULT MULTIVITAMIN W/MINERALS CH: 1.0000 | ORAL_TABLET | Freq: Every evening | ORAL | Status: DC

## 2023-08-31 NOTE — TOC Transition Note (Signed)
 Transition of Care [facility]) - Discharge Note   Patient Details  Name: Sparsh Callens MRN: [id number] Date of Birth: August 12, 1950  Transition [facility] Care [facility] CM/SW Contact:  Ginia Rudell C Tanaisha Pittman, RN Phone Number: 08/31/2023, 2:28 PM   Clinical Narrative:    Spoke with Tammy in admissions at [facility]  Per facility patient admission confirmed for today. Patient assigned room # 807 Report will be called to [phone] Face sheet and medical necessity forms printed to the floor to be added to the EMS pack EMS arranged  He's next.  Discharge summary and SNF transfer report sent in HUB.  Nurse, and family notified spoke with TOC signing off.    Final next level of care: (P) Skilled Nursing Facility Barriers to Discharge: Continued Medical Work up   Patient Goals and CMS Choice            Discharge Placement                       Discharge Plan and Services Additional resources added to the After Visit Summary for     Discharge Planning Services: CM Consult Post Acute Care Choice: Skilled Nursing Facility                               Social Drivers of Health (SDOH) Interventions SDOH Screenings   Food Insecurity: No Food Insecurity (08/26/2023)  Housing: Low Risk  (08/26/2023)  Transportation Needs: No Transportation Needs (08/26/2023)  Utilities: Not At Risk (08/26/2023)  Financial Resource Strain: Medium Risk (04/06/2023)   [facility]  Social Connections: Moderately Isolated (08/26/2023)  Tobacco Use: Medium Risk (08/26/2023)     Readmission Risk Interventions     No data to display

## 2023-08-31 NOTE — Discharge Summary (Signed)
 Physician Discharge Summary   Patient: John Spears MRN: [id number] DOB: 1950-04-01  Admit date:     08/26/2023  Discharge date: 08/31/23  Discharge Physician: AIDA CHO   PCP: Patient, No Pcp Per   Recommendations at discharge:   Follow-up with physician in the next home within 3 days of discharge Follow-up with Dr. Jannett Fairly, neurologist, within 1 month of discharge  Discharge Diagnoses: Principal Problem:   Breakthrough seizure [facility]) Active Problems:   Seizure (HCC)  Resolved Problems:   * No resolved hospital problems. *  Hospital Course:   Sayed Apostol is a 73 y.o. male with medical history significant for prior stroke with mild right-sided hemiparesis, hyperlipidemia, atrial fibrillation on Xarelto , seizures with multiple episodes of breakthrough seizures, medical nonadherence, alcohol use disorder, who presented to the hospital for evaluation of breakthrough seizures. Reportedly, his sister heard a popping sound from his room so she went to check on him.  On arrival, she noticed that patient's whole body was stiff and shaking.  Patient also had urinary incontinence. Apparently there was an open container of beer in his room.    Prior to admission, he was on Keppra  1500 mg twice daily and Vimpat  150 mg twice daily.   Assessment and Plan:   Breakthrough seizures in a patient with seizure disorder, recurrent, brief jerking movements lasting less than 1 minute:  Continue Keppra  1500 mg twice daily and Vimpat  200 mg twice daily (increased from 150 mg twice daily). Seizure precautions. No evidence of acute stroke on limited MRI brain.     History of stroke with mild right-sided hemiparesis: Continue aspirin  and Lipitor     Hypomagnesemia: Improved     Paroxysmal atrial fibrillation: Continue Xarelto      General Weakness: PT recommended discharge to SNF     Comorbidities include alcohol use disorder, BPH, chronic anemia, hypertension     His  condition has improved and he is deemed stable for discharge to SNF today.  Discharge plan was discussed with Dickey, sister, over the phone.       Consultants: Neurologist Procedures performed: None Disposition: Skilled nursing facility Diet recommendation:  Discharge Diet Orders (From admission, onward)     Start     Ordered   08/31/23 0000  Diet - low sodium heart healthy        08/31/23 1215           Cardiac diet DISCHARGE MEDICATION: Allergies as of 08/31/2023   Not on File      Medication List     STOP taking these medications    gabapentin  100 MG capsule Commonly known as: NEURONTIN        TAKE these medications    acetaminophen  500 MG tablet Commonly known as: TYLENOL  Take 1 tablet (500 mg total) by mouth every 6 (six) hours as needed for moderate pain (pain score 4-6).   amLODipine  10 MG tablet Commonly known as: NORVASC  Take 10 mg by mouth at bedtime.   Aspirin  Low Dose 81 MG tablet Generic drug: aspirin  EC Take 81 mg by mouth daily.   atorvastatin  40 MG tablet Commonly known as: LIPITOR Take 40 mg by mouth at bedtime.   carvedilol  12.5 MG tablet Commonly known as: COREG  Take 1 tablet (12.5 mg total) by mouth 2 (two) times daily.   cyanocobalamin  1000 MCG tablet Commonly known as: VITAMIN B12 Take 1,000 mcg by mouth in the morning.   feeding supplement Liqd Take 237 mLs by mouth 2 (two) times daily  between meals.   folic acid  1 MG tablet Commonly known as: FOLVITE  Take 1 mg by mouth in the morning.   furosemide  20 MG tablet Commonly known as: LASIX  TAKE 1 TABLET BY MOUTH ONCE A DAY   lacosamide  200 MG Tabs tablet Commonly known as: VIMPAT  Take 1 tablet (200 mg total) by mouth 2 (two) times daily. What changed:  medication strength how much to take   levETIRAcetam  750 MG tablet Commonly known as: KEPPRA  Take 2 tablets (1,500 mg total) by mouth 2 (two) times daily.   multivitamin with minerals Tabs tablet Take 1 tablet by  mouth every evening.   rivaroxaban  20 MG Tabs tablet Commonly known as: XARELTO  Take 1 tablet (20 mg total) by mouth daily with supper.   tamsulosin  0.4 MG Caps capsule Commonly known as: FLOMAX  Take 0.4 mg by mouth at bedtime.   thiamine  100 MG tablet Commonly known as: VITAMIN B1 Take 100 mg by mouth in the morning.        Discharge Exam: Filed Weights   08/26/23 0239  Weight: 74.9 kg   GEN: NAD SKIN: Warm and dry EYES: No pallor or icterus ENT: MMM CV: RRR PULM: CTA B ABD: soft, ND, NT, +BS CNS: AAO x 2 (person and place), non focal EXT: No edema or tenderness   Condition at discharge: good  The results of significant diagnostics from this hospitalization (including imaging, microbiology, ancillary and laboratory) are listed below for reference.   Imaging Studies: MR BRAIN WO CONTRAST Result Date: 08/26/2023 CLINICAL DATA:  73 year old male with neurologic deficit, seizure. EXAM: MRI HEAD WITHOUT CONTRAST TECHNIQUE: Multiplanar, multiecho pulse sequences of the brain and surrounding structures were obtained without intravenous contrast. COMPARISON:  CT head, CTA earlier today.  Brain MRI 02/21/2022. FINDINGS: The examination had to be discontinued prior to completion due to active patient seizures. Mildly motion degraded axial DWI and moderately motion degraded FLAIR imaging was obtained. No restricted diffusion or evidence of acute infarction. No intracranial mass effect, midline shift, or ventriculomegaly. Chronic encephalomalacia in the left parietal lobe, small chronic right thalamic lacunar infarct redemonstrated. IMPRESSION: Limited noncontrast Brain MRI, aborted due to active patient seizing. No evidence of acute ischemia. Chronic left parietal lobe and right thalamic infarcts. Electronically Signed   By: VEAR Hurst M.D.   On: 08/26/2023 09:49   DG Chest Port 1 View Result Date: 08/26/2023 CLINICAL DATA:  Seizure. EXAM: PORTABLE CHEST 1 VIEW COMPARISON:  07/26/2023  FINDINGS: The cardio pericardial silhouette is enlarged. The lungs are clear without focal pneumonia, edema, pneumothorax or pleural effusion. No acute bony abnormality. Telemetry leads overlie the chest. IMPRESSION: Enlargement of the cardiopericardial silhouette without acute cardiopulmonary findings. Electronically Signed   By: Camellia Candle M.D.   On: 08/26/2023 07:44   CT HEAD WO CONTRAST Result Date: 08/26/2023 CLINICAL DATA:  73 year old male neurologic deficit, seizure. EXAM: CT ANGIOGRAPHY HEAD AND NECK WITH AND WITHOUT CONTRAST TECHNIQUE: Multidetector CT imaging of the head and neck was performed using the standard protocol during bolus administration of intravenous contrast. Multiplanar CT image reconstructions and MIPs were obtained to evaluate the vascular anatomy. Carotid stenosis measurements (when applicable) are obtained utilizing NASCET criteria, using the distal internal carotid diameter as the denominator. RADIATION DOSE REDUCTION: This exam was performed according to the departmental dose-optimization program which includes automated exposure control, adjustment of the mA and/or kV according to patient size and/or use of iterative reconstruction technique. CONTRAST:  OMNIPAQUE  IOHEXOL  350 MG/ML SOLN COMPARISON:  Head CT  07/23/2023 and earlier. CTA head and neck 03/09/2023 and earlier. FINDINGS: CT HEAD Brain: Chronic left parietal lobe cystic encephalomalacia is unchanged from 2023. Stable cerebral volume since that time. No midline shift, ventriculomegaly, mass effect, evidence of mass lesion, intracranial hemorrhage or evidence of cortically based acute infarction. Small chronic right thalamic lacunar infarct is stable. Stable gray-white matter differentiation throughout the brain. Calvarium and skull base: Intact. No acute osseous abnormality identified. Paranasal sinuses: Visualized paranasal sinuses and mastoids are stable and well aerated. Orbits: Chronic right vertex simple scalp  lipoma. No acute orbit or scalp soft tissue finding identified. CTA NECK Skeleton: Absent dentition. Chronic lower cervical spine disc and endplate degeneration. No acute osseous abnormality identified. Upper chest: Mild respiratory motion, otherwise negative. Other neck: Nonvascular neck soft tissue spaces appear negative. Aortic arch: 3 vessel arch. Minimal arch atherosclerosis. Tortuosity of the distal arch and proximal descending aorta. Right carotid system: Mildly tortuous brachiocephalic artery without plaque or stenosis. Normal right CCA origin. Soft plaque at the anterior wall of the right CCA at the level of the larynx without stenosis on series 8, image 127. Negative right carotid bifurcation, cervical right ICA. Left carotid system: Mild tortuosity. Mild motion artifact at the thoracic inlet. No plaque or stenosis. Vertebral arteries: Mild calcified plaque of the proximal right subclavian artery without stenosis. Normal right vertebral artery origin. Right vertebral artery is patent, mildly tortuous to the skull base with no plaque or significant stenosis identified. Proximal left subclavian artery minimal plaque without stenosis. Normal left vertebral artery origin. Dominant left vertebral artery is patent with mild tortuosity, no stenosis to the skull base. CTA HEAD Posterior circulation: Patent distal vertebral arteries and vertebrobasilar junction with dominant left V4, diminutive non dominant right V4 segments unchanged from 2022 CTA. Both PICA origins are patent. Patent basilar artery without stenosis. Both posterior communicating arteries are present, patent SCA and PCA origins. Fetal type left PCA origin. Mildly ectatic basilar tip is stable since 2022 without discrete aneurysm. Bilateral PCA branches are stable since 2022, patent with mild irregularity throughout. Anterior circulation: Both ICA siphons are patent. Left siphon calcified plaque is mild without stenosis. Normal left posterior  communicating artery origin. Right siphon calcified plaque is moderate in the cavernous segment. And appears progressed since 2022 with mild to moderate right cavernous stenosis now (series 10, image 116). Negative supraclinoid right ICA. Patent carotid termini. MCA and ACA origins appears stable and normal. Anterior communicating artery is diminutive. Bilateral ACA branches are stable and within normal limits. Left MCA M1 segment and left MCA branches are stable since 2022, patent with only mild irregularity. Right MCA M1 segment and right MCA branches are stable since 2022 with mild irregularity. Venous sinuses: Early contrast timing, not well evaluated. Anatomic variants: Dominant left vertebral artery, fetal type left PCA origin. Review of the MIP images confirms the above findings IMPRESSION: 1. Continued stable CT appearance of the brain and CTA is negative for large vessel occlusion. 2. Positive for progressed Right ICA siphon calcified plaque since a 2022 CTA, now with mild to moderate right cavernous ICA stenosis. 3. Stable mild for age atherosclerosis otherwise. No other significant arterial stenosis. Electronically Signed   By: VEAR Hurst M.D.   On: 08/26/2023 05:52   CT ANGIO HEAD NECK W WO CM Result Date: 08/26/2023 CLINICAL DATA:  73 year old male neurologic deficit, seizure. EXAM: CT ANGIOGRAPHY HEAD AND NECK WITH AND WITHOUT CONTRAST TECHNIQUE: Multidetector CT imaging of the head and neck was performed using the standard  protocol during bolus administration of intravenous contrast. Multiplanar CT image reconstructions and MIPs were obtained to evaluate the vascular anatomy. Carotid stenosis measurements (when applicable) are obtained utilizing NASCET criteria, using the distal internal carotid diameter as the denominator. RADIATION DOSE REDUCTION: This exam was performed according to the departmental dose-optimization program which includes automated exposure control, adjustment of the mA and/or kV  according to patient size and/or use of iterative reconstruction technique. CONTRAST:  OMNIPAQUE  IOHEXOL  350 MG/ML SOLN COMPARISON:  Head CT 07/23/2023 and earlier. CTA head and neck 03/09/2023 and earlier. FINDINGS: CT HEAD Brain: Chronic left parietal lobe cystic encephalomalacia is unchanged from 2023. Stable cerebral volume since that time. No midline shift, ventriculomegaly, mass effect, evidence of mass lesion, intracranial hemorrhage or evidence of cortically based acute infarction. Small chronic right thalamic lacunar infarct is stable. Stable gray-white matter differentiation throughout the brain. Calvarium and skull base: Intact. No acute osseous abnormality identified. Paranasal sinuses: Visualized paranasal sinuses and mastoids are stable and well aerated. Orbits: Chronic right vertex simple scalp lipoma. No acute orbit or scalp soft tissue finding identified. CTA NECK Skeleton: Absent dentition. Chronic lower cervical spine disc and endplate degeneration. No acute osseous abnormality identified. Upper chest: Mild respiratory motion, otherwise negative. Other neck: Nonvascular neck soft tissue spaces appear negative. Aortic arch: 3 vessel arch. Minimal arch atherosclerosis. Tortuosity of the distal arch and proximal descending aorta. Right carotid system: Mildly tortuous brachiocephalic artery without plaque or stenosis. Normal right CCA origin. Soft plaque at the anterior wall of the right CCA at the level of the larynx without stenosis on series 8, image 127. Negative right carotid bifurcation, cervical right ICA. Left carotid system: Mild tortuosity. Mild motion artifact at the thoracic inlet. No plaque or stenosis. Vertebral arteries: Mild calcified plaque of the proximal right subclavian artery without stenosis. Normal right vertebral artery origin. Right vertebral artery is patent, mildly tortuous to the skull base with no plaque or significant stenosis identified. Proximal left subclavian  artery minimal plaque without stenosis. Normal left vertebral artery origin. Dominant left vertebral artery is patent with mild tortuosity, no stenosis to the skull base. CTA HEAD Posterior circulation: Patent distal vertebral arteries and vertebrobasilar junction with dominant left V4, diminutive non dominant right V4 segments unchanged from 2022 CTA. Both PICA origins are patent. Patent basilar artery without stenosis. Both posterior communicating arteries are present, patent SCA and PCA origins. Fetal type left PCA origin. Mildly ectatic basilar tip is stable since 2022 without discrete aneurysm. Bilateral PCA branches are stable since 2022, patent with mild irregularity throughout. Anterior circulation: Both ICA siphons are patent. Left siphon calcified plaque is mild without stenosis. Normal left posterior communicating artery origin. Right siphon calcified plaque is moderate in the cavernous segment. And appears progressed since 2022 with mild to moderate right cavernous stenosis now (series 10, image 116). Negative supraclinoid right ICA. Patent carotid termini. MCA and ACA origins appears stable and normal. Anterior communicating artery is diminutive. Bilateral ACA branches are stable and within normal limits. Left MCA M1 segment and left MCA branches are stable since 2022, patent with only mild irregularity. Right MCA M1 segment and right MCA branches are stable since 2022 with mild irregularity. Venous sinuses: Early contrast timing, not well evaluated. Anatomic variants: Dominant left vertebral artery, fetal type left PCA origin. Review of the MIP images confirms the above findings IMPRESSION: 1. Continued stable CT appearance of the brain and CTA is negative for large vessel occlusion. 2. Positive for progressed Right ICA siphon  calcified plaque since a 2022 CTA, now with mild to moderate right cavernous ICA stenosis. 3. Stable mild for age atherosclerosis otherwise. No other significant arterial  stenosis. Electronically Signed   By: VEAR Hurst M.D.   On: 08/26/2023 05:52    Microbiology: Results for orders placed or performed during the hospital encounter of 07/23/23  MRSA Next Gen by PCR, Nasal     Status: None   Collection Time: 07/24/23  2:51 AM   Specimen: Nasal Mucosa; Nasal Swab  Result Value Ref Range Status   MRSA by PCR Next Gen NOT DETECTED NOT DETECTED Final    Comment: (NOTE) The GeneXpert MRSA Assay (FDA approved for NASAL specimens only), is one component of a comprehensive MRSA colonization surveillance program. It is not intended to diagnose MRSA infection nor to guide or monitor treatment for MRSA infections. Test performance is not FDA approved in patients less than 35 years old. Performed at [facility], [address] [place], [place] [address]   Culture, blood (Routine X 2) w Reflex to ID Panel     Status: Abnormal   Collection Time: 07/26/23 12:57 PM   Specimen: BLOOD  Result Value Ref Range Status   Specimen Description   Final    BLOOD BLOOD LEFT ARM Performed at [facility], [address] [place], [place] [address]    Special Requests   Final    BOTTLES DRAWN AEROBIC AND ANAEROBIC Blood Culture adequate volume Performed at [facility], [address] [place], [place] [address]    Culture  Setup Time   Final    GRAM NEGATIVE RODS IN BOTH AEROBIC AND ANAEROBIC BOTTLES CRITICAL RESULT CALLED TO, READ BACK BY AND VERIFIED WITH:  JASON ROBINS AT 9876 07/27/23 JG GRAM STAIN REVIEWED-AGREE WITH RESULT DRT Performed at [facility], [address], [place], [place] [address]    Culture ESCHERICHIA COLI (A)  Final   Report Status 07/29/2023 FINAL  Final   Organism ID, Bacteria ESCHERICHIA COLI  Final   Organism ID, Bacteria ESCHERICHIA COLI  Final      Susceptibility   Escherichia coli - KIRBY BAUER*    CEFAZOLIN SENSITIVE Sensitive    Escherichia coli - MIC*    AMPICILLIN  4 SENSITIVE Sensitive     CEFEPIME  <=0.12 SENSITIVE Sensitive     CEFTAZIDIME <=1 SENSITIVE Sensitive     CEFTRIAXONE  <=0.25 SENSITIVE Sensitive     CIPROFLOXACIN <=0.25 SENSITIVE Sensitive     GENTAMICIN <=1 SENSITIVE Sensitive     IMIPENEM <=0.25 SENSITIVE Sensitive     TRIMETH/SULFA <=20 SENSITIVE Sensitive     AMPICILLIN /SULBACTAM <=2 SENSITIVE Sensitive     PIP/TAZO <=4 SENSITIVE Sensitive ug/mL    * ESCHERICHIA COLI    ESCHERICHIA COLI  Blood Culture ID Panel (Reflexed)     Status: Abnormal   Collection Time: 07/26/23 12:57 PM  Result Value Ref Range Status   Enterococcus faecalis NOT DETECTED NOT DETECTED Final   Enterococcus Faecium NOT DETECTED NOT DETECTED Final   Listeria monocytogenes NOT DETECTED NOT DETECTED Final   Staphylococcus species NOT DETECTED NOT DETECTED Final   Staphylococcus aureus (BCID) NOT DETECTED NOT DETECTED Final   Staphylococcus epidermidis NOT DETECTED NOT DETECTED Final   Staphylococcus lugdunensis NOT DETECTED NOT DETECTED Final   Streptococcus species NOT DETECTED NOT DETECTED Final   Streptococcus agalactiae NOT DETECTED NOT DETECTED Final   Streptococcus pneumoniae NOT DETECTED NOT DETECTED Final   Streptococcus pyogenes NOT DETECTED NOT DETECTED Final  A.calcoaceticus-baumannii NOT DETECTED NOT DETECTED Final   Bacteroides fragilis NOT DETECTED NOT DETECTED Final   Enterobacterales DETECTED (A) NOT DETECTED Final    Comment: Enterobacterales represent a large order of gram negative bacteria, not a single organism. CRITICAL RESULT CALLED TO, READ BACK BY AND VERIFIED WITH:  JASON ROBINS AT 0123 07/27/23 JG    Enterobacter cloacae complex NOT DETECTED NOT DETECTED Final   Escherichia coli DETECTED (A) NOT DETECTED Final    Comment: CRITICAL RESULT CALLED TO, READ BACK BY AND VERIFIED WITH:  JASON ROBINS AT 0123 07/27/23 JG    Klebsiella aerogenes NOT DETECTED NOT DETECTED Final   Klebsiella oxytoca NOT DETECTED NOT DETECTED Final   Klebsiella pneumoniae NOT DETECTED NOT  DETECTED Final   Proteus species NOT DETECTED NOT DETECTED Final   Salmonella species NOT DETECTED NOT DETECTED Final   Serratia marcescens NOT DETECTED NOT DETECTED Final   Haemophilus influenzae NOT DETECTED NOT DETECTED Final   Neisseria meningitidis NOT DETECTED NOT DETECTED Final   Pseudomonas aeruginosa NOT DETECTED NOT DETECTED Final   Stenotrophomonas maltophilia NOT DETECTED NOT DETECTED Final   Candida albicans NOT DETECTED NOT DETECTED Final   Candida auris NOT DETECTED NOT DETECTED Final   Candida glabrata NOT DETECTED NOT DETECTED Final   Candida krusei NOT DETECTED NOT DETECTED Final   Candida parapsilosis NOT DETECTED NOT DETECTED Final   Candida tropicalis NOT DETECTED NOT DETECTED Final   Cryptococcus neoformans/gattii NOT DETECTED NOT DETECTED Final   CTX-M ESBL NOT DETECTED NOT DETECTED Final   Carbapenem resistance IMP NOT DETECTED NOT DETECTED Final   Carbapenem resistance KPC NOT DETECTED NOT DETECTED Final   Carbapenem resistance NDM NOT DETECTED NOT DETECTED Final   Carbapenem resist OXA 48 LIKE NOT DETECTED NOT DETECTED Final   Carbapenem resistance VIM NOT DETECTED NOT DETECTED Final    Comment: Performed at [facility], [address] [place], [place] [address]  Culture, blood (Routine X 2) w Reflex to ID Panel     Status: Abnormal   Collection Time: 07/26/23  1:03 PM   Specimen: BLOOD  Result Value Ref Range Status   Specimen Description BLOOD BLOOD RIGHT ARM  Final   Special Requests   Final    BOTTLES DRAWN AEROBIC AND ANAEROBIC Blood Culture adequate volume   Culture  Setup Time   Final    GRAM NEGATIVE RODS AEROBIC BOTTLE ONLY CRITICAL VALUE NOTED.  VALUE IS CONSISTENT WITH PREVIOUSLY REPORTED AND CALLED VALUE. GRAM STAIN REVIEWED-AGREE WITH RESULT DRT    Culture (A)  Final    ESCHERICHIA COLI SUSCEPTIBILITIES PERFORMED ON PREVIOUS CULTURE WITHIN THE LAST 5 DAYS.    Report Status 07/29/2023 FINAL  Final  Urine Culture (for pregnant,  neutropenic or urologic patients or patients with an indwelling urinary catheter)     Status: Abnormal   Collection Time: 07/27/23  8:49 AM   Specimen: Urine, Clean Catch  Result Value Ref Range Status   Specimen Description   Final    URINE, CLEAN CATCH Performed at [facility], [address], [place], [place] [address]    Special Requests   Final    NONE Performed at [facility], [address], [place], [place] [address]    Culture (A)  Final    5,000 COLONIES/mL STAPHYLOCOCCUS EPIDERMIDIS 1,000 COLONIES/mL ENTEROCOCCUS FAECALIS CALL MICROBIOLOGY LAB IF SENSITIVITIES ARE REQUIRED. Performed at [facility], [address], [place], [place] [address]    Report Status 07/30/2023 FINAL  Final  Culture, blood (Routine X 2) w Reflex to ID Panel     Status: None   Collection Time: 07/28/23  8:23 AM   Specimen: BLOOD  Result Value Ref Range Status   Specimen Description BLOOD LEFT ANTECUBITAL  Final   Special Requests   Final    BOTTLES DRAWN AEROBIC AND ANAEROBIC Blood Culture adequate volume   Culture   Final    NO GROWTH 5 DAYS Performed at [facility], [address] [place], [place] [address]    Report Status 08/02/2023 FINAL  Final  Culture, blood (Routine X 2) w Reflex to ID Panel     Status: None   Collection Time: 07/28/23  8:31 AM   Specimen: BLOOD  Result Value Ref Range Status   Specimen Description BLOOD RIGHT ANTECUBITAL  Final   Special Requests   Final    BOTTLES DRAWN AEROBIC AND ANAEROBIC Blood Culture adequate volume   Culture   Final    NO GROWTH 5 DAYS Performed at [facility], [address], [place], [place] [address]    Report Status 08/02/2023 FINAL  Final    Labs: CBC: Recent Labs  Lab 08/26/23 0247 08/27/23 0352 08/28/23 0418 08/29/23 0323 08/30/23 0236  WBC 5.7 5.4 5.4 6.5 5.5  NEUTROABS 3.6  --   --   --   --   HGB 9.4* 9.2* 8.9* 9.1* 9.5*  HCT 28.8* 28.3* 27.1* 28.3* 28.8*  MCV 87.5  86.5 87.1 86.8 87.5  PLT 164 157 160 158 170   Basic Metabolic Panel: Recent Labs  Lab 08/26/23 0247 08/26/23 0857 08/27/23 0352 08/28/23 0418 08/29/23 0323 08/30/23 0236  NA 138 139 139 139 137 136  K 3.8 4.6 3.8 3.6 3.7 3.9  CL 107 103 105 105 105 103  CO2 24 25 26 25 25 25   GLUCOSE 129* 159* 93 88 85 98  BUN 22 19 14 13 14 18   CREATININE 1.15 1.05 0.75 0.88 1.01 1.17  CALCIUM  8.9 9.1 9.1 9.2 9.1 9.2  MG 1.6* 2.1 2.0 1.8 1.7 1.8  PHOS 2.6  --  2.9 3.2 3.3 3.9   Liver Function Tests: Recent Labs  Lab 08/26/23 0857 08/27/23 0352 08/28/23 0418 08/29/23 0323 08/30/23 0236  AST 23 18 18 18 18   ALT 12 9 9 9 11   ALKPHOS 81 64 59 56 59  BILITOT 0.7 0.8 0.6 0.7 0.4  PROT 6.5 6.2* 6.1* 6.1* 6.0*  ALBUMIN 3.4* 3.1* 3.0* 3.0* 3.1*   CBG: Recent Labs  Lab 08/26/23 0252  GLUCAP 129*    Discharge time spent: greater than 30 minutes.  Signed: AIDA CHO, MD [facility] 08/31/2023

## 2023-08-31 NOTE — Progress Notes (Signed)
 Occupational Therapy Treatment Patient Details Name: John Spears MRN: [id number] DOB: 03-May-1950 Today's Date: 08/31/2023   History of present illness Pt is a 73 year old male presented for evaluation of breakthrough seizures    PMH significant for alcohol abuse, hypertension, prior stroke with mild right-sided hemiparesis at baseline, hyperlipidemia, A-fib on Xarelto , seizures with multiple episodes of breakthrough seizures due to noncompliance   OT comments  Pt seen for OT treatment on this date. Upon arrival to room pt semi supine, asleep but wakes easily to voice, agreeable to tx. Pt seemly more confused at the time of session, states he was sitting in the front room before coming into his bedroom. Pt requires supervision for bed mobility with use of single bed rail and LUE on bedrail, pt declined use of RW for transfer. Pt's bed and gown was noted to be soaked in urine, male purewick was become unattached, pt unaware. Pt slightly impulsive when STS from EOB, eager to get into recliner. Frequent verbal cues required for safe transferring technique. Pt utilized +1 HHA to take few steps into recliner. Pt required MINA for donning/doffing gowns, and MODA for LB dressing. Pt completed x3 STS from recliner throughout dressing tasks. Pt making good progress toward goals, will continue to follow POC. Discharge recommendation remains appropriate.        If plan is discharge home, recommend the following:  A little help with walking and/or transfers;A lot of help with bathing/dressing/bathroom;Help with stairs or ramp for entrance;Assistance with cooking/housework;Supervision due to cognitive status   Equipment Recommendations  BSC/3in1    Recommendations for Other Services      Precautions / Restrictions Precautions Precautions: Fall Recall of Precautions/Restrictions: Impaired       Mobility Bed Mobility Overal bed mobility: Needs Assistance Bed Mobility: Supine to Sit     Supine  to sit: Supervision, HOB elevated, Used rails     General bed mobility comments: No physical assistance to complete    Transfers Overall transfer level: Needs assistance Equipment used: 1 person hand held assist Transfers: Sit to/from Stand Sit to Stand: Min assist                 Balance Overall balance assessment: Needs assistance Sitting-balance support: Feet supported Sitting balance-Leahy Scale: Fair Sitting balance - Comments: Steady reaching within BOS   Standing balance support: Bilateral upper extremity supported, During functional activity Standing balance-Leahy Scale: Fair Standing balance comment: Single UE supported on bedrail, static standing at bedside                           ADL either performed or assessed with clinical judgement   ADL Overall ADL's : Needs assistance/impaired     Grooming: Wash/dry hands;Wash/dry face;Sitting;Set up           Upper Body Dressing : Minimal assistance;Sitting Upper Body Dressing Details (indicate cue type and reason): Donning/doffing gowns Lower Body Dressing: Moderate assistance (Adjusting socks while sitting in recliner)   Toilet Transfer: Minimal assistance;Ambulation Toilet Transfer Details (indicate cue type and reason): Simulated t/f from EOB<>recliner with HHA         Functional mobility during ADLs: Minimal assistance;Rolling walker (2 wheels) (HHA) General ADL Comments: Setup for facewashing in sitting, MODA LB dressing, MINA UB dressing in sitting    Communication Communication Communication: Impaired Factors Affecting Communication: Difficulty expressing self;Reduced clarity of speech   Cognition Arousal: Alert Behavior During Therapy: WFL for tasks assessed/performed Cognition: History  of cognitive impairments, Cognition impaired   Orientation impairments: Time, Place Awareness: Intellectual awareness impaired, Online awareness impaired Memory impairment (select all impairments):  Declarative long-term memory Attention impairment (select first level of impairment): Sustained attention Executive functioning impairment (select all impairments): Reasoning, Problem solving, Sequencing OT - Cognition Comments: Pt confused on location, difficult to redirect at times                 Following commands: Impaired Following commands impaired: Follows one step commands with increased time      Cueing   Cueing Techniques: Verbal cues, Gestural cues  Exercises Exercises: Other exercises Other Exercises Other Exercises: Edu: Safe ADL completion, hand placement throughout session    Shoulder Instructions       General Comments Pt slightly impulsive during transfers, requiring frequent verbal cues for safety and sequencing    Pertinent Vitals/ Pain       Pain Assessment Pain Assessment: No/denies pain  Home Living Family/patient expects to be discharged to:: Skilled nursing facility                                        Prior Functioning/Environment              Frequency  Min 2X/week        Progress Toward Goals  OT Goals(current goals can now be found in the care plan section)  Progress towards OT goals: Progressing toward goals  Acute Rehab OT Goals OT Goal Formulation: With patient Time For Goal Achievement: 09/10/23 Potential to Achieve Goals: Fair  Plan      Co-evaluation                 AM-PAC OT 6 Clicks Daily Activity     Outcome Measure   Help from another person eating meals?: A Little Help from another person taking care of personal grooming?: A Little Help from another person toileting, which includes using toliet, bedpan, or urinal?: A Little Help from another person bathing (including washing, rinsing, drying)?: A Lot Help from another person to put on and taking off regular upper body clothing?: A Little Help from another person to put on and taking off regular lower body clothing?: A Little 6  Click Score: 17    End of Session Equipment Utilized During Treatment: Gait belt  OT Visit Diagnosis: Other abnormalities of gait and mobility (R26.89);Muscle weakness (generalized) (M62.81)   Activity Tolerance Patient tolerated treatment well   Patient Left in chair;with call bell/phone within reach;with chair alarm set   Nurse Communication Mobility status        Time: 8772-8754 OT Time Calculation (min): 18 min  Charges: OT General Charges $OT Visit: 1 Visit OT Treatments $Self Care/Home Management : 8-22 mins  Larraine Colas M.S. OTR/L  08/31/23, 1:36 PM

## 2023-08-31 NOTE — TOC Progression Note (Addendum)
 Transition of Care [facility]) - Progression Note    Patient Details  Name: John Spears MRN: [id number] Date of Birth: 09/22/1950  [facility] CM/SW Contact  Tomasa JAYSON Childes, RN Phone Number: 08/31/2023, 11:05 AM  Clinical Narrative:    Spoke with patient's sister, Dickey. She confirmed [facility] is their choice of  facility. She is asking for a medical update.  MD notified of request.   12:38pm Per [facility] approved 08/30/23-09/02/23 plan Auth id #[id number]     Expected Discharge Plan: Skilled Nursing Facility Barriers to Discharge: Continued Medical Work up  Expected Discharge Plan and Services   Discharge Planning Services: CM Consult Post Acute Care Choice: Skilled Nursing Facility Living arrangements for the past 2 months: Single Family Home                                       Social Determinants of Health (SDOH) Interventions SDOH Screenings   Food Insecurity: No Food Insecurity (08/26/2023)  Housing: Low Risk  (08/26/2023)  Transportation Needs: No Transportation Needs (08/26/2023)  Utilities: Not At Risk (08/26/2023)  Financial Resource Strain: Medium Risk (04/06/2023)   Received from [facility]  Social Connections: Moderately Isolated (08/26/2023)  Tobacco Use: Medium Risk (08/26/2023)    Readmission Risk Interventions     No data to display

## 2023-08-31 NOTE — Plan of Care (Signed)

## 2023-10-05 ENCOUNTER — Emergency Department

## 2023-10-05 DIAGNOSIS — I5042 Chronic combined systolic (congestive) and diastolic (congestive) heart failure: Secondary | ICD-10-CM | POA: Diagnosis present

## 2023-10-05 DIAGNOSIS — G40909 Epilepsy, unspecified, not intractable, without status epilepticus: Secondary | ICD-10-CM | POA: Diagnosis present

## 2023-10-05 DIAGNOSIS — F10139 Alcohol abuse with withdrawal, unspecified: Secondary | ICD-10-CM | POA: Diagnosis not present

## 2023-10-05 DIAGNOSIS — Z7901 Long term (current) use of anticoagulants: Secondary | ICD-10-CM

## 2023-10-05 DIAGNOSIS — M978XXA Periprosthetic fracture around other internal prosthetic joint, initial encounter: Secondary | ICD-10-CM | POA: Diagnosis not present

## 2023-10-05 DIAGNOSIS — G252 Other specified forms of tremor: Secondary | ICD-10-CM | POA: Diagnosis present

## 2023-10-05 DIAGNOSIS — G9389 Other specified disorders of brain: Secondary | ICD-10-CM | POA: Diagnosis present

## 2023-10-05 DIAGNOSIS — M9701XA Periprosthetic fracture around internal prosthetic right hip joint, initial encounter: Principal | ICD-10-CM | POA: Diagnosis present

## 2023-10-05 DIAGNOSIS — R296 Repeated falls: Secondary | ICD-10-CM | POA: Diagnosis present

## 2023-10-05 DIAGNOSIS — N1831 Chronic kidney disease, stage 3a: Secondary | ICD-10-CM | POA: Diagnosis present

## 2023-10-05 DIAGNOSIS — I130 Hypertensive heart and chronic kidney disease with heart failure and stage 1 through stage 4 chronic kidney disease, or unspecified chronic kidney disease: Secondary | ICD-10-CM | POA: Diagnosis present

## 2023-10-05 DIAGNOSIS — Z5986 Financial insecurity: Secondary | ICD-10-CM

## 2023-10-05 DIAGNOSIS — N400 Enlarged prostate without lower urinary tract symptoms: Secondary | ICD-10-CM | POA: Diagnosis present

## 2023-10-05 DIAGNOSIS — I480 Paroxysmal atrial fibrillation: Secondary | ICD-10-CM | POA: Diagnosis present

## 2023-10-05 DIAGNOSIS — Z7982 Long term (current) use of aspirin: Secondary | ICD-10-CM

## 2023-10-05 DIAGNOSIS — W010XXA Fall on same level from slipping, tripping and stumbling without subsequent striking against object, initial encounter: Secondary | ICD-10-CM | POA: Diagnosis present

## 2023-10-05 DIAGNOSIS — Z8673 Personal history of transient ischemic attack (TIA), and cerebral infarction without residual deficits: Secondary | ICD-10-CM

## 2023-10-05 DIAGNOSIS — F0390 Unspecified dementia without behavioral disturbance: Secondary | ICD-10-CM | POA: Diagnosis present

## 2023-10-05 DIAGNOSIS — H547 Unspecified visual loss: Secondary | ICD-10-CM | POA: Diagnosis present

## 2023-10-05 DIAGNOSIS — I420 Dilated cardiomyopathy: Secondary | ICD-10-CM | POA: Diagnosis present

## 2023-10-05 DIAGNOSIS — E785 Hyperlipidemia, unspecified: Secondary | ICD-10-CM | POA: Diagnosis present

## 2023-10-05 DIAGNOSIS — Z79899 Other long term (current) drug therapy: Secondary | ICD-10-CM

## 2023-10-05 DIAGNOSIS — K5900 Constipation, unspecified: Secondary | ICD-10-CM | POA: Diagnosis present

## 2023-10-05 DIAGNOSIS — R059 Cough, unspecified: Secondary | ICD-10-CM | POA: Diagnosis not present

## 2023-10-05 DIAGNOSIS — Z1152 Encounter for screening for COVID-19: Secondary | ICD-10-CM

## 2023-10-05 DIAGNOSIS — S72114A Nondisplaced fracture of greater trochanter of right femur, initial encounter for closed fracture: Principal | ICD-10-CM | POA: Diagnosis present

## 2023-10-05 DIAGNOSIS — S60519A Abrasion of unspecified hand, initial encounter: Secondary | ICD-10-CM | POA: Diagnosis present

## 2023-10-05 DIAGNOSIS — Z8249 Family history of ischemic heart disease and other diseases of the circulatory system: Secondary | ICD-10-CM

## 2023-10-05 DIAGNOSIS — Z87891 Personal history of nicotine dependence: Secondary | ICD-10-CM

## 2023-10-05 NOTE — ED Notes (Signed)
 First nurse note- pt brought in via ems from home with a fall.  Pt has right hip and right knee pain  hx etoh abuse.  Pt alert in wheelchair in lobby.   Bp 169/80, oxygen sats 100% , p-78 per ems

## 2023-10-05 NOTE — ED Triage Notes (Addendum)
 Pt reports he was walking to mailbox and lost his balance yesterday, pt c/o pain to right hip and abrasions to back of hand. Pt rpeorts he fell again today due to the pain in his hip. Pt cannot confirm or not if he hit his head, pt take xarelto . ETOH+

## 2023-10-06 ENCOUNTER — Inpatient Hospital Stay

## 2023-10-06 ENCOUNTER — Other Ambulatory Visit: Payer: Self-pay

## 2023-10-06 ENCOUNTER — Inpatient Hospital Stay
Admission: EM | Admit: 2023-10-06 | Discharge: 2023-10-13 | DRG: 536 | Disposition: A | Discharge status: Skilled Nursing Facility | Attending: Internal Medicine | Admitting: Internal Medicine

## 2023-10-06 ENCOUNTER — Emergency Department

## 2023-10-06 DIAGNOSIS — G9389 Other specified disorders of brain: Secondary | ICD-10-CM | POA: Diagnosis present

## 2023-10-06 DIAGNOSIS — I480 Paroxysmal atrial fibrillation: Secondary | ICD-10-CM | POA: Diagnosis present

## 2023-10-06 DIAGNOSIS — W19XXXA Unspecified fall, initial encounter: Secondary | ICD-10-CM

## 2023-10-06 DIAGNOSIS — I5032 Chronic diastolic (congestive) heart failure: Secondary | ICD-10-CM | POA: Diagnosis present

## 2023-10-06 DIAGNOSIS — M978XXA Periprosthetic fracture around other internal prosthetic joint, initial encounter: Secondary | ICD-10-CM

## 2023-10-06 DIAGNOSIS — N400 Enlarged prostate without lower urinary tract symptoms: Secondary | ICD-10-CM | POA: Diagnosis present

## 2023-10-06 DIAGNOSIS — Z1152 Encounter for screening for COVID-19: Secondary | ICD-10-CM | POA: Diagnosis not present

## 2023-10-06 DIAGNOSIS — F10139 Alcohol abuse with withdrawal, unspecified: Secondary | ICD-10-CM | POA: Diagnosis not present

## 2023-10-06 DIAGNOSIS — G40909 Epilepsy, unspecified, not intractable, without status epilepticus: Secondary | ICD-10-CM | POA: Diagnosis present

## 2023-10-06 DIAGNOSIS — G252 Other specified forms of tremor: Secondary | ICD-10-CM | POA: Diagnosis present

## 2023-10-06 DIAGNOSIS — F0390 Unspecified dementia without behavioral disturbance: Secondary | ICD-10-CM | POA: Diagnosis present

## 2023-10-06 DIAGNOSIS — Z96649 Presence of unspecified artificial hip joint: Secondary | ICD-10-CM | POA: Diagnosis not present

## 2023-10-06 DIAGNOSIS — K5900 Constipation, unspecified: Secondary | ICD-10-CM | POA: Diagnosis present

## 2023-10-06 DIAGNOSIS — Z87891 Personal history of nicotine dependence: Secondary | ICD-10-CM | POA: Diagnosis not present

## 2023-10-06 DIAGNOSIS — R569 Unspecified convulsions: Secondary | ICD-10-CM | POA: Diagnosis not present

## 2023-10-06 DIAGNOSIS — Z7982 Long term (current) use of aspirin: Secondary | ICD-10-CM | POA: Diagnosis not present

## 2023-10-06 DIAGNOSIS — I10 Essential (primary) hypertension: Secondary | ICD-10-CM | POA: Diagnosis present

## 2023-10-06 DIAGNOSIS — N1831 Chronic kidney disease, stage 3a: Secondary | ICD-10-CM | POA: Diagnosis present

## 2023-10-06 DIAGNOSIS — I130 Hypertensive heart and chronic kidney disease with heart failure and stage 1 through stage 4 chronic kidney disease, or unspecified chronic kidney disease: Secondary | ICD-10-CM | POA: Diagnosis present

## 2023-10-06 DIAGNOSIS — M978XXD Periprosthetic fracture around other internal prosthetic joint, subsequent encounter: Secondary | ICD-10-CM | POA: Diagnosis not present

## 2023-10-06 DIAGNOSIS — S60519A Abrasion of unspecified hand, initial encounter: Secondary | ICD-10-CM | POA: Diagnosis present

## 2023-10-06 DIAGNOSIS — F1010 Alcohol abuse, uncomplicated: Secondary | ICD-10-CM | POA: Diagnosis present

## 2023-10-06 DIAGNOSIS — I639 Cerebral infarction, unspecified: Secondary | ICD-10-CM | POA: Diagnosis present

## 2023-10-06 DIAGNOSIS — Z8673 Personal history of transient ischemic attack (TIA), and cerebral infarction without residual deficits: Secondary | ICD-10-CM | POA: Diagnosis present

## 2023-10-06 DIAGNOSIS — S72114A Nondisplaced fracture of greater trochanter of right femur, initial encounter for closed fracture: Principal | ICD-10-CM

## 2023-10-06 DIAGNOSIS — I420 Dilated cardiomyopathy: Secondary | ICD-10-CM | POA: Diagnosis present

## 2023-10-06 DIAGNOSIS — W010XXA Fall on same level from slipping, tripping and stumbling without subsequent striking against object, initial encounter: Secondary | ICD-10-CM | POA: Diagnosis present

## 2023-10-06 DIAGNOSIS — Z8249 Family history of ischemic heart disease and other diseases of the circulatory system: Secondary | ICD-10-CM | POA: Diagnosis not present

## 2023-10-06 DIAGNOSIS — I4891 Unspecified atrial fibrillation: Secondary | ICD-10-CM | POA: Diagnosis present

## 2023-10-06 DIAGNOSIS — Z7901 Long term (current) use of anticoagulants: Secondary | ICD-10-CM | POA: Diagnosis not present

## 2023-10-06 DIAGNOSIS — M9701XA Periprosthetic fracture around internal prosthetic right hip joint, initial encounter: Secondary | ICD-10-CM | POA: Diagnosis present

## 2023-10-06 DIAGNOSIS — R296 Repeated falls: Secondary | ICD-10-CM | POA: Diagnosis present

## 2023-10-06 DIAGNOSIS — F1090 Alcohol use, unspecified, uncomplicated: Secondary | ICD-10-CM

## 2023-10-06 DIAGNOSIS — I5042 Chronic combined systolic (congestive) and diastolic (congestive) heart failure: Secondary | ICD-10-CM | POA: Diagnosis present

## 2023-10-06 DIAGNOSIS — H547 Unspecified visual loss: Secondary | ICD-10-CM | POA: Diagnosis present

## 2023-10-06 DIAGNOSIS — Z5986 Financial insecurity: Secondary | ICD-10-CM | POA: Diagnosis not present

## 2023-10-06 DIAGNOSIS — E785 Hyperlipidemia, unspecified: Secondary | ICD-10-CM | POA: Diagnosis present

## 2023-10-06 LAB — CBC WITH DIFFERENTIAL/PLATELET
Abs Immature Granulocytes: 0.03 K/uL (ref 0.00–0.07)
Basophils Absolute: 0.1 K/uL (ref 0.0–0.1)
Basophils Relative: 1 %
Eosinophils Absolute: 0.1 K/uL (ref 0.0–0.5)
Eosinophils Relative: 1 %
HCT: 32.3 % — ABNORMAL LOW (ref 39.0–52.0)
Hemoglobin: 10.4 g/dL — ABNORMAL LOW (ref 13.0–17.0)
Immature Granulocytes: 0 %
Lymphocytes Relative: 19 %
Lymphs Abs: 1.6 K/uL (ref 0.7–4.0)
MCH: 27.1 pg (ref 26.0–34.0)
MCHC: 32.2 g/dL (ref 30.0–36.0)
MCV: 84.1 fL (ref 80.0–100.0)
Monocytes Absolute: 0.9 K/uL (ref 0.1–1.0)
Monocytes Relative: 11 %
Neutro Abs: 5.5 K/uL (ref 1.7–7.7)
Neutrophils Relative %: 68 %
Platelets: 233 K/uL (ref 150–400)
RBC: 3.84 MIL/uL — ABNORMAL LOW (ref 4.22–5.81)
RDW: 15.3 % (ref 11.5–15.5)
WBC: 8.1 K/uL (ref 4.0–10.5)
nRBC: 0 % (ref 0.0–0.2)

## 2023-10-06 LAB — BASIC METABOLIC PANEL WITH GFR
Anion gap: 12 (ref 5–15)
BUN: 17 mg/dL (ref 8–23)
CO2: 20 mmol/L — ABNORMAL LOW (ref 22–32)
Calcium: 9.3 mg/dL (ref 8.9–10.3)
Chloride: 105 mmol/L (ref 98–111)
Creatinine, Ser: 1.08 mg/dL (ref 0.61–1.24)
GFR, Estimated: >60 mL/min (ref >60)
Glucose, Bld: 104 mg/dL — ABNORMAL HIGH (ref 70–99)
Potassium: 4.2 mmol/L (ref 3.5–5.1)
Sodium: 137 mmol/L (ref 135–145)

## 2023-10-06 LAB — VITAMIN B12: Vitamin B-12: 482 pg/mL (ref 180–914)

## 2023-10-06 LAB — ETHANOL: Alcohol, Ethyl (B): <15 mg/dL (ref <15)

## 2023-10-06 MED ORDER — VITAMIN B-12 1000 MCG PO TABS
1000.0000 ug | ORAL_TABLET | Freq: Every morning | ORAL | Status: DC
  Administered 2023-10-06 – 2023-10-13 (×9): 1000 ug via ORAL
  Filled 2023-10-06 (×4): qty 1
  Filled 2023-10-06: qty 2
  Filled 2023-10-06 (×3): qty 1

## 2023-10-06 MED ORDER — LORAZEPAM 2 MG/ML IJ SOLN: 1.0000 mg | INTRAMUSCULAR | Status: DC | PRN

## 2023-10-06 MED ORDER — ASPIRIN 81 MG PO TBEC
81.0000 mg | DELAYED_RELEASE_TABLET | Freq: Every day | ORAL | Status: DC
  Administered 2023-10-06 – 2023-10-13 (×9): 81 mg via ORAL
  Filled 2023-10-06 (×8): qty 1

## 2023-10-06 MED ORDER — MORPHINE SULFATE (PF) 2 MG/ML IV SOLN
2.0000 mg | Freq: Once | INTRAVENOUS | Status: AC
  Administered 2023-10-06 (×2): 2 mg via INTRAVENOUS
  Filled 2023-10-06: qty 1

## 2023-10-06 MED ORDER — LACOSAMIDE 50 MG PO TABS
150.0000 mg | ORAL_TABLET | Freq: Two times a day (BID) | ORAL | Status: DC
  Administered 2023-10-06 – 2023-10-13 (×17): 150 mg via ORAL
  Filled 2023-10-06 (×15): qty 3

## 2023-10-06 MED ORDER — THIAMINE HCL 100 MG/ML IJ SOLN: 100.0000 mg | Freq: Every day | INTRAMUSCULAR | Status: DC

## 2023-10-06 MED ORDER — RIVAROXABAN 20 MG PO TABS
20.0000 mg | ORAL_TABLET | Freq: Every day | ORAL | Status: DC
  Administered 2023-10-06 – 2023-10-12 (×8): 20 mg via ORAL
  Filled 2023-10-06 (×9): qty 1

## 2023-10-06 MED ORDER — LORAZEPAM 1 MG PO TABS
1.0000 mg | ORAL_TABLET | ORAL | Status: AC | PRN
  Administered 2023-10-06 (×8): 1 mg via ORAL
  Administered 2023-10-07 (×2): 2 mg via ORAL
  Administered 2023-10-07 – 2023-10-08 (×4): 1 mg via ORAL
  Filled 2023-10-06 (×8): qty 1
  Filled 2023-10-06 (×2): qty 2
  Filled 2023-10-06: qty 1

## 2023-10-06 MED ORDER — ACETAMINOPHEN 650 MG RE SUPP: 650.0000 mg | Freq: Four times a day (QID) | RECTAL | Status: DC | PRN

## 2023-10-06 MED ORDER — OXYCODONE-ACETAMINOPHEN 5-325 MG PO TABS
1.0000 | ORAL_TABLET | ORAL | Status: DC | PRN
  Administered 2023-10-06 (×2): 2 via ORAL
  Administered 2023-10-08 (×3): 1 via ORAL
  Administered 2023-10-08: 2 via ORAL
  Administered 2023-10-09 – 2023-10-10 (×3): 1 via ORAL
  Administered 2023-10-10: 2 via ORAL
  Administered 2023-10-10 – 2023-10-11 (×2): 1 via ORAL
  Administered 2023-10-11: 2 via ORAL
  Administered 2023-10-12: 1 via ORAL
  Filled 2023-10-06: qty 2
  Filled 2023-10-06 (×2): qty 1
  Filled 2023-10-06: qty 2
  Filled 2023-10-06 (×7): qty 1
  Filled 2023-10-06 (×2): qty 2

## 2023-10-06 MED ORDER — ACETAMINOPHEN 325 MG PO TABS: 650.0000 mg | ORAL_TABLET | Freq: Four times a day (QID) | ORAL | Status: DC | PRN

## 2023-10-06 MED ORDER — LEVETIRACETAM 750 MG PO TABS
1500.0000 mg | ORAL_TABLET | Freq: Two times a day (BID) | ORAL | Status: DC
  Administered 2023-10-06 – 2023-10-13 (×17): 1500 mg via ORAL
  Filled 2023-10-06 (×13): qty 2
  Filled 2023-10-06: qty 3
  Filled 2023-10-06 (×2): qty 2

## 2023-10-06 MED ORDER — SODIUM CHLORIDE 0.9% FLUSH
3.0000 mL | Freq: Two times a day (BID) | INTRAVENOUS | Status: DC
  Administered 2023-10-06 – 2023-10-13 (×17): 3 mL via INTRAVENOUS

## 2023-10-06 MED ORDER — ATORVASTATIN CALCIUM 20 MG PO TABS
40.0000 mg | ORAL_TABLET | Freq: Every day | ORAL | Status: DC
  Administered 2023-10-06 – 2023-10-12 (×8): 40 mg via ORAL
  Filled 2023-10-06 (×7): qty 2

## 2023-10-06 MED ORDER — CARVEDILOL 12.5 MG PO TABS
12.5000 mg | ORAL_TABLET | Freq: Two times a day (BID) | ORAL | Status: DC
  Administered 2023-10-06 – 2023-10-13 (×16): 12.5 mg via ORAL
  Filled 2023-10-06 (×5): qty 1
  Filled 2023-10-06: qty 2
  Filled 2023-10-06 (×10): qty 1

## 2023-10-06 MED ORDER — TAB-A-VITE/IRON PO TABS
1.0000 | ORAL_TABLET | Freq: Every day | ORAL | Status: DC
  Administered 2023-10-06 – 2023-10-12 (×7): 1 via ORAL
  Filled 2023-10-06 (×8): qty 1

## 2023-10-06 MED ORDER — THIAMINE MONONITRATE 100 MG PO TABS
100.0000 mg | ORAL_TABLET | Freq: Every day | ORAL | Status: DC
  Administered 2023-10-06 – 2023-10-13 (×9): 100 mg via ORAL
  Filled 2023-10-06 (×8): qty 1

## 2023-10-06 MED ORDER — ADULT MULTIVITAMIN W/MINERALS CH
1.0000 | ORAL_TABLET | Freq: Every day | ORAL | Status: DC
  Administered 2023-10-06 – 2023-10-13 (×9): 1 via ORAL
  Filled 2023-10-06 (×9): qty 1

## 2023-10-06 MED ORDER — THIAMINE HCL 100 MG PO TABS: 100.0000 mg | ORAL_TABLET | Freq: Every morning | ORAL | Status: DC

## 2023-10-06 MED ORDER — MORPHINE SULFATE (PF) 2 MG/ML IV SOLN: 2.0000 mg | INTRAVENOUS | Status: DC | PRN

## 2023-10-06 MED ORDER — ONDANSETRON HCL 4 MG PO TABS: 4.0000 mg | ORAL_TABLET | Freq: Four times a day (QID) | ORAL | Status: DC | PRN

## 2023-10-06 MED ORDER — TAMSULOSIN HCL 0.4 MG PO CAPS
0.4000 mg | ORAL_CAPSULE | Freq: Every day | ORAL | Status: DC
  Administered 2023-10-06 – 2023-10-12 (×8): 0.4 mg via ORAL
  Filled 2023-10-06 (×7): qty 1

## 2023-10-06 MED ORDER — AMLODIPINE BESYLATE 10 MG PO TABS
10.0000 mg | ORAL_TABLET | Freq: Every day | ORAL | Status: DC
  Administered 2023-10-06 – 2023-10-12 (×8): 10 mg via ORAL
  Filled 2023-10-06 (×7): qty 1

## 2023-10-06 MED ORDER — ONDANSETRON HCL 4 MG/2ML IJ SOLN: 4.0000 mg | Freq: Four times a day (QID) | INTRAMUSCULAR | Status: DC | PRN

## 2023-10-06 MED ORDER — LACOSAMIDE 150 MG PO TABS
1.0000 | ORAL_TABLET | Freq: Two times a day (BID) | ORAL | Status: DC
  Filled 2023-10-06: qty 1

## 2023-10-06 MED ORDER — FOLIC ACID 1 MG PO TABS: 1.0000 mg | ORAL_TABLET | Freq: Every day | ORAL | Status: DC

## 2023-10-06 MED ORDER — DIAZEPAM 5 MG/ML IJ SOLN
10.0000 mg | Freq: Once | INTRAMUSCULAR | Status: AC
  Administered 2023-10-06 (×2): 10 mg via INTRAVENOUS
  Filled 2023-10-06: qty 2

## 2023-10-06 MED ORDER — GABAPENTIN 100 MG PO CAPS
100.0000 mg | ORAL_CAPSULE | Freq: Two times a day (BID) | ORAL | Status: DC
  Administered 2023-10-06 – 2023-10-13 (×17): 100 mg via ORAL
  Filled 2023-10-06 (×15): qty 1

## 2023-10-06 MED ORDER — FOLIC ACID 1 MG PO TABS
1.0000 mg | ORAL_TABLET | Freq: Every morning | ORAL | Status: DC
  Administered 2023-10-06 – 2023-10-13 (×9): 1 mg via ORAL
  Filled 2023-10-06 (×8): qty 1

## 2023-10-06 NOTE — ED Provider Notes (Signed)
 [facility] Provider Note    Event Date/Time   First MD Initiated Contact with Patient 10/06/23 0149     (approximate)   History   Fall   HPI  John Spears is a 73 y.o. male   Past medical history of alcohol use, seizures, cardiomyopathy, heart failure, CVA, on Xarelto , who presents to the Emergency Department with fall and right hip pain.  He states that he was walking to his mailbox in the rain and slipped and fell injuring his right hip.  He reinjured his right hip given that there was pain on there already as he was trying to ambulate of his stairs later that day.  He was unable to get up.  He did not hit his head.  He received no other injuries aside from pain in his right hip.  He has a hip replacement in the right side but he does not know who his orthopedic surgeon was or when he got the surgery done.  He believes it was done here at [facility] but I see no records of the same.  He does have a clinical summary from [facility] that does note joint replacement but I cannot find mention there or at [facility] health summaries about who his surgeon was aware and it was done.  He does drink 2 quarts of beer daily.  His last drink was sometime yesterday.  He has chronic shaking of all extremities, coarse tremors, that is unchanged from prior and noted on a medical note that I see from outside hospitals from many years ago with the same findings then.  He reports compliance with his medications as his sister helps give him his medications.  He believes that his last dose of Xarelto  was in the morning yesterday around 9 AM on 10/05/2023.   External Medical Documents Reviewed: Outside medical records chart review      Physical Exam   Triage Vital Signs: ED Triage Vitals  Encounter Vitals Group     BP 10/05/23 1905 (!) 143/82     Girls Systolic BP Percentile --      Girls Diastolic BP Percentile --      Boys Systolic BP Percentile --      Boys Diastolic  BP Percentile --      Pulse Rate 10/05/23 1905 75     Resp 10/05/23 1905 20     Temp 10/05/23 1905 97.6 F (36.4 C)     Temp Source 10/05/23 1905 Oral     SpO2 10/05/23 1905 100 %     Weight 10/05/23 1921 168 lb (76.2 kg)     Height 10/05/23 1921 5' 11 (1.803 m)     Head Circumference --      [facility] Flow --      Pain Score 10/05/23 1921 8     Pain Loc --      Pain Education --      Exclude from Growth Chart --     Most recent vital signs: Vitals:   10/06/23 0622 10/06/23 0640  BP: (!) 133/58   Pulse: (!) 57   Resp:    Temp:  98.2 F (36.8 C)  SpO2:      General: Awake, no distress.  CV:  Good peripheral perfusion.  Resp:  Normal effort.  Abd:  No distention.  Other:  Coarse tremors throughout his entire body but nonfocal neurologic exam with sensation throughout, motor function intact throughout, awake alert oriented though confused about his  past medical history and surgical history, medications.  No signs of head trauma neck supple forage motion T and L-spine nontender no deformities palpated.  No tenderness to palpation of the thorax or abdomen.  Able to range his left lower extremity with full active range of motion and bilateral upper extremities.  He is unable to range at the hip on the right side due to pain.  He is neurovascular intact to the affected extremity.   ED Results / Procedures / Treatments   Labs (all labs ordered are listed, but only abnormal results are displayed) Labs Reviewed  CBC WITH DIFFERENTIAL/PLATELET - Abnormal; Notable for the following components:      Result Value   RBC 3.84 (*)    Hemoglobin 10.4 (*)    HCT 32.3 (*)    All other components within normal limits  BASIC METABOLIC PANEL WITH GFR - Abnormal; Notable for the following components:   CO2 20 (*)    Glucose, Bld 104 (*)    All other components within normal limits  ETHANOL    I ordered and reviewed the above labs they are notable for cell counts electrolytes  unremarkable   RADIOLOGY I independently reviewed and interpreted x-ray of the right hip and I do see a greater trochanter lucency concerning for fracture and intact hardware from prior hip replacement. I also reviewed radiologist's formal read.   PROCEDURES:  Critical Care performed: No  Procedures   MEDICATIONS ORDERED IN ED: Medications  LORazepam  (ATIVAN ) tablet 1-4 mg (1 mg Oral Given 10/06/23 0522)    Or  LORazepam  (ATIVAN ) injection 1-4 mg ( Intravenous See Alternative 10/06/23 0522)  thiamine  (VITAMIN B1) tablet 100 mg (has no administration in time range)    Or  thiamine  (VITAMIN B1) injection 100 mg (has no administration in time range)  folic acid  (FOLVITE ) tablet 1 mg (has no administration in time range)  multivitamin with minerals tablet 1 tablet (has no administration in time range)  diazepam  (VALIUM ) injection 10 mg (10 mg Intravenous Given 10/06/23 0226)  morphine  (PF) 2 MG/ML injection 2 mg (2 mg Intravenous Given 10/06/23 0313)    External physician / consultants:  I spoke with on-call orthopedist Dr. Tobie who reviewed the case and notes nonoperative management for this type of injury, hospital medicine for admission and regarding care plan for this patient.   IMPRESSION / MDM / ASSESSMENT AND PLAN / ED COURSE  I reviewed the triage vital signs and the nursing notes.                                Patient's presentation is most consistent with acute presentation with potential threat to life or bodily function.  Differential diagnosis includes, but is not limited to, fracture or dislocation, other traumatic injury including head neck thoracic or abdominal injury, other extremity injury, alcohol withdrawal,   MDM:    Suspected right greater trochanter nondisplaced periprosthetic fracture on x-ray, corresponds to his area of pain and fall.  Mechanical fall in nature without presyncopal symptoms, doubt other medical causes causing his fall.  He is many  hours out from his last drink is a significant drinker but shows no acute signs of alcohol withdrawal currently fortunately.  He does have coarse tremors throughout his whole body that is noted to be chronic and unchanged and looks like the documented tremors from his hospital course many years ago from outside record review.  However  I think prudent to get ahead of his potential withdrawals by giving a dose of benzodiazepine now and putting him on CIWA.  The x-ray imaging findings are subtle and so I will confirm with CT imaging given the periprosthetic nature of this to further delineate the extent of injury to determine in consultation with orthopedics whether or not this is a surgical or nonoperative course as it is very unclear as to where he got the surgery and when.   Nevertheless as he is no longer able to ambulate he will need to be admitted for this injury.   -- I was able to talk with his Sister Dickey who notes that his hip replacement was done over 20 years ago here at [facility] but she is unsure of the surgeon who did the work.      FINAL CLINICAL IMPRESSION(S) / ED DIAGNOSES   Final diagnoses:  Nondisplaced fracture of greater trochanter of right femur, initial encounter for closed fracture [facility])  Fall, initial encounter  Alcohol use     Rx / DC Orders   ED Discharge Orders     None        Note:  This document was prepared using Dragon voice recognition software and may include unintentional dictation errors.    Cyrena Mylar, MD 10/06/23 [phone]

## 2023-10-06 NOTE — Evaluation (Signed)
 Physical Therapy Evaluation Patient Details Name: John Spears MRN: [id number] DOB: 1951/02/12 Today's Date: 10/06/2023  History of Present Illness  73 y.o. male s/p recent fall and subsequent right hip pain, but was still able to ambulate.  He then had another fall and had difficulty with ambulation afterwards.  He has had prior right hip replacement over 10 years ago, and he cannot recall the surgeon.  Imaging in the emergency department showed a minimally displaced greater trochanteric periprosthetic fracture - no plans for surgerical intervention.  Clinical Impression  Pt was able to participate with PT but did struggle with mobility and transfer efforts.  Reviewed multiple times the need to protect hip with no hip ABd, did seem to follow after repeated cuing.  He did need help with moving R LE to/from sit<>supine.  Pt was able to rise to standing and showed great effort with ambulation, however he had step-to limp and heavy UE use on the walker but did manage to go ~50 ft.  Pt will require continued PT to address functional limitations.        If plan is discharge home, recommend the following: A lot of help with walking and/or transfers;A little help with bathing/dressing/bathroom;Assist for transportation;Help with stairs or ramp for entrance   Can travel by private vehicle        Equipment Recommendations None recommended by PT  Recommendations for Other Services       Functional Status Assessment Patient has had a recent decline in their functional status and demonstrates the ability to make significant improvements in function in a reasonable and predictable amount of time.     Precautions / Restrictions Precautions Precautions: Fall Restrictions Weight Bearing Restrictions Per Provider Order: Yes RLE Weight Bearing Per Provider Order: Weight bearing as tolerated Other Position/Activity Restrictions: per ortho consult R LE is WBAT for non surgical fx. No active hip  abduction x 6 wks.      Mobility  Bed Mobility Overal bed mobility: Needs Assistance Bed Mobility: Supine to Sit, Sit to Supine     Supine to sit: Mod assist Sit to supine: Max assist, Mod assist   General bed mobility comments: Pt needed max assist to get LEs toward EOB, direct mod assist to come to upright sitting    Transfers Overall transfer level: Needs assistance Equipment used: Rolling walker (2 wheels) Transfers: Sit to/from Stand Sit to Stand: Mod assist, Min assist, From elevated surface           General transfer comment: heavy cuing for set up, heavy UE use,hesitant to R WBing but able to maintain balance    Ambulation/Gait Ambulation/Gait assistance: Min assist Gait Distance (Feet): 50 Feet Assistive device: Rolling walker (2 wheels)         General Gait Details: Pt with significant hesitation/limp on the R heavy UE use of walker, tending to use step-to strategy to limit stance time on R  Stairs            Wheelchair Mobility     Tilt Bed    Modified Rankin (Stroke Patients Only)       Balance Overall balance assessment: Needs assistance Sitting-balance support: Feet supported Sitting balance-Leahy Scale: Good     Standing balance support: Bilateral upper extremity supported Standing balance-Leahy Scale: Poor                               Pertinent Vitals/Pain Pain Assessment Pain  Assessment: Faces Faces Pain Scale: Hurts even more Pain Location: R LE    Home Living Family/patient expects to be discharged to:: Private residence Living Arrangements: Other relatives Available Help at Discharge: Family;Available PRN/intermittently Type of Home: House Home Access: Stairs to enter Entrance Stairs-Rails: Right Entrance Stairs-Number of Steps: 3   Home Layout: One level Home Equipment: Shower seat;Grab bars - tub/shower;Rolling Walker (2 wheels)      Prior Function Prior Level of Function : Needs assist              Mobility Comments: Pt reports not use of AD for mobility in home but uses RW outside or when going onto porch ADLs Comments: Pt endorses Mod I for ADLs and sister assists with IADLs.     Extremity/Trunk Assessment   Upper Extremity Assessment Upper Extremity Assessment: Generalized weakness    Lower Extremity Assessment Lower Extremity Assessment: Generalized weakness (no active R hip ABd, grossly 3/5 otherwise)       Communication   Communication Communication: No apparent difficulties    Cognition Arousal: Alert Behavior During Therapy: WFL for tasks assessed/performed                             Following commands: Intact       Cueing Cueing Techniques: Verbal cues     General Comments      Exercises General Exercises - Lower Extremity Ankle Circles/Pumps: AROM, 10 reps Quad Sets: AROM, 10 reps Heel Slides: AAROM, 5 reps (lightly resisted leg ext) Hip ABduction/ADduction: PROM, 5 reps   Assessment/Plan    PT Assessment Patient needs continued PT services  PT Problem List Decreased strength;Decreased range of motion;Decreased activity tolerance;Decreased balance;Decreased mobility;Decreased knowledge of use of DME;Decreased safety awareness;Pain       PT Treatment Interventions DME instruction;Gait training;Stair training;Functional mobility training;Therapeutic activities;Therapeutic exercise;Balance training;Cognitive remediation;Patient/family education    PT Goals (Current goals can be found in the Care Plan section)  Acute Rehab PT Goals Patient Stated Goal: get walking better PT Goal Formulation: With patient Time For Goal Achievement: 10/19/23 Potential to Achieve Goals: Fair    Frequency Min 2X/week     Co-evaluation               AM-PAC PT 6 Clicks Mobility  Outcome Measure Help needed turning from your back to your side while in a flat bed without using bedrails?: A Little Help needed moving from lying on your  back to sitting on the side of a flat bed without using bedrails?: A Lot Help needed moving to and from a bed to a chair (including a wheelchair)?: A Lot Help needed standing up from a chair using your arms (e.g., wheelchair or bedside chair)?: A Lot Help needed to walk in hospital room?: Total Help needed climbing 3-5 steps with a railing? : Total 6 Click Score: 11    End of Session Equipment Utilized During Treatment: Gait belt Activity Tolerance: Patient tolerated treatment well;Patient limited by pain;Patient limited by fatigue Patient left: with call bell/phone within reach;with bed alarm set Nurse Communication: Mobility status PT Visit Diagnosis: Unsteadiness on feet (R26.81);Muscle weakness (generalized) (M62.81);Difficulty in walking, not elsewhere classified (R26.2)    Time: 8442-8376 PT Time Calculation (min) (ACUTE ONLY): 26 min   Charges:   PT Evaluation $PT Eval Low Complexity: 1 Low PT Treatments $Gait Training: 8-22 mins PT General Charges $$ ACUTE PT VISIT: 1 Visit  Carmin JONELLE Deed, DPT 10/06/2023, 5:53 PM

## 2023-10-06 NOTE — Evaluation (Signed)
 Occupational Therapy Evaluation Patient Details Name: John Spears MRN: [id number] DOB: 1950/07/05 Today's Date: 10/06/2023   History of Present Illness   73 y.o. male who had a fall at home a few days ago.  He did notice right hip pain, but was still able to ambulate.  He then had another fall and had difficulty with ambulation afterwards. The patient states he does have a walker at home but uses it intermittently. He lives at home with his sister. He has had prior right hip replacement over 10 years ago, and he cannot recall the surgeon.  Imaging in the emergency department showed a minimally displaced greater trochanteric periprosthetic fracture.     Clinical Impressions Patient presenting with decreased Ind in self care,balance, functional mobility/transfers, endurance, and safety awareness. Patient reports being mod I with use of RW outside of home for mobility. Pt lives with sister and they perform IADL tasks together. Pt reports being able to perform ADLs independently at baseline. OT reviewed precautions and situation with pt. Heavy Max A for bed mobility to come to EOB and then to stand. Pt unable to full offload feet to take side step on EOB. Pt is far from baseline at this time. Patient will benefit from acute OT to increase overall independence in the areas of ADLs, functional mobility, and safety awareness in order to safely discharge.      If plan is discharge home, recommend the following:   Two people to help with walking and/or transfers;A lot of help with bathing/dressing/bathroom;Assistance with cooking/housework;Help with stairs or ramp for entrance;Assist for transportation     Functional Status Assessment   Patient has had a recent decline in their functional status and demonstrates the ability to make significant improvements in function in a reasonable and predictable amount of time.     Equipment Recommendations   Other (comment) (defer to next venue of  care)     Precautions/Restrictions   Precautions Precautions: Fall Restrictions Weight Bearing Restrictions Per Provider Order: No Other Position/Activity Restrictions: per ortho consult R LE is WBAT for non surgical fx. No active hip abduction x 6 wks.     Mobility Bed Mobility Overal bed mobility: Needs Assistance Bed Mobility: Supine to Sit, Sit to Supine     Supine to sit: Max assist Sit to supine: Max assist        Transfers Overall transfer level: Needs assistance Equipment used: 1 person hand held assist Transfers: Sit to/from Stand Sit to Stand: Max assist                  Balance Overall balance assessment: Needs assistance Sitting-balance support: Feet supported Sitting balance-Leahy Scale: Good     Standing balance support: Bilateral upper extremity supported Standing balance-Leahy Scale: Poor                             ADL either performed or assessed with clinical judgement   ADL Overall ADL's : Needs assistance/impaired                     Lower Body Dressing: Maximal assistance;Total assistance;Sit to/from stand       Toileting- Architect and Hygiene: Maximal assistance Toileting - Clothing Manipulation Details (indicate cue type and reason): simulated             Vision Patient Visual Report: No change from baseline  Pertinent Vitals/Pain Pain Assessment Pain Assessment: Faces Faces Pain Scale: Hurts whole lot Pain Location: R LE Pain Descriptors / Indicators: Aching, Discomfort, Grimacing, Guarding, Sharp Pain Intervention(s): Limited activity within patient's tolerance, Repositioned, Monitored during session     Extremity/Trunk Assessment Upper Extremity Assessment Upper Extremity Assessment: Generalized weakness   Lower Extremity Assessment Lower Extremity Assessment: Defer to PT evaluation       Communication Communication Communication: No apparent difficulties    Cognition Arousal: Alert Behavior During Therapy: WFL for tasks assessed/performed Cognition: No apparent impairments                               Following commands: Intact       Cueing  General Comments   Cueing Techniques: Verbal cues              Home Living Family/patient expects to be discharged to:: Private residence Living Arrangements: Other relatives (sister) Available Help at Discharge: Family;Available PRN/intermittently Type of Home: House Home Access: Stairs to enter [employer] of Steps: 3 Entrance Stairs-Rails: Right Home Layout: One level     Bathroom Shower/Tub: Producer, television/film/video: Standard Bathroom Accessibility: Yes   Home Equipment: Shower seat;Grab bars - tub/shower;Rolling Environmental consultant (2 wheels)          Prior Functioning/Environment Prior Level of Function : Needs assist             Mobility Comments: Pt reports not use of AD for mobility in home but uses RW outside or when going onto porch ADLs Comments: Pt endorses Mod I for ADLs and sister assists with IADLs.    OT Problem List: Decreased strength;Decreased safety awareness;Decreased activity tolerance;Decreased knowledge of use of DME or AE;Impaired balance (sitting and/or standing);Cardiopulmonary status limiting activity   OT Treatment/Interventions: Self-care/ADL training;Therapeutic activities;Therapeutic exercise;Energy conservation;Patient/family education;DME and/or AE instruction;Balance training      OT Goals(Current goals can be found in the care plan section)   Acute Rehab OT Goals Patient Stated Goal: to decrease pain OT Goal Formulation: With patient Time For Goal Achievement: 10/20/23 Potential to Achieve Goals: Fair ADL Goals Pt Will Perform Grooming: sitting;with modified independence Pt Will Perform Lower Body Dressing: sit to/from stand;with min assist;with adaptive equipment Pt Will Transfer to Toilet: with min  assist;bedside commode Pt Will Perform Toileting - Clothing Manipulation and hygiene: with min assist;sit to/from stand   OT Frequency:  Min 2X/week       AM-PAC OT 6 Clicks Daily Activity     Outcome Measure Help from another person eating meals?: None Help from another person taking care of personal grooming?: None Help from another person toileting, which includes using toliet, bedpan, or urinal?: Total Help from another person bathing (including washing, rinsing, drying)?: A Lot Help from another person to put on and taking off regular upper body clothing?: A Little Help from another person to put on and taking off regular lower body clothing?: Total 6 Click Score: 15   End of Session Nurse Communication: Mobility status  Activity Tolerance: Patient limited by pain Patient left: in bed;with call bell/phone within reach;with bed alarm set  OT Visit Diagnosis: Unsteadiness on feet (R26.81);Repeated falls (R29.6);Muscle weakness (generalized) (M62.81)                Time: 8892-8879 OT Time Calculation (min): 13 min Charges:  OT General Charges $OT Visit: 1 Visit OT Evaluation $OT Eval Moderate Complexity: 1 Mod  Brandy Zuba  Patt Steinhardt, MS, OTR/L , CBIS ascom [phone]  10/06/23, 12:02 PM

## 2023-10-06 NOTE — Plan of Care (Signed)
  Problem: Education: Goal: Knowledge of General Education information will improve Description: Including pain rating scale, medication(s)/side effects and non-pharmacologic comfort measures Outcome: Progressing   Problem: Activity: Goal: Risk for activity intolerance will decrease Outcome: Progressing   Problem: Nutrition: Goal: Adequate nutrition will be maintained Outcome: Progressing   Problem: Elimination: Goal: Will not experience complications related to bowel motility Outcome: Progressing   Problem: Safety: Goal: Ability to remain free from injury will improve Outcome: Progressing   Problem: Skin Integrity: Goal: Risk for impaired skin integrity will decrease Outcome: Progressing   Problem: Activity: Goal: Ability to ambulate and perform ADLs will improve Outcome: Progressing   Problem: Pain Management: Goal: Pain level will decrease Outcome: Progressing

## 2023-10-06 NOTE — Procedures (Signed)
 History: 73 year old with a history of seizures being evaluated for a fall  EEG Duration: 23 minutes  Sedation: None  Patient State: Awake and asleep  Technique: This EEG was acquired with electrodes placed according to the International 10-20 electrode system (including Fp1, Fp2, F3, F4, C3, C4, P3, P4, O1, O2, T3, T4, T5, T6, A1, A2, Fz, Cz, Pz). The following electrodes were missing or displaced: none.   Background: The background consists of intermixed alpha and beta activities. There is a well defined posterior dominant rhythm of 9 Hz that attenuates with eye opening. Sleep is recorded with normal appearing structures.   Photic stimulation: Physiologic driving is not performed  EEG Abnormalities: None  Clinical Interpretation: This normal EEG is recorded in the waking and sleep state. There was no seizure or seizure predisposition recorded on this study. Please note that lack of epileptiform activity on EEG does not preclude the possibility of epilepsy.   Aisha Seals, MD [facility]   If 7pm- 7am, please page neurology on call as listed in AMION.

## 2023-10-06 NOTE — H&P (Signed)
 History and Physical    Patient: John Spears FMW:[id number] DOB: 09/12/1950 DOA: 10/06/2023 DOS: the patient was seen and examined on 10/06/2023 PCP: Patient, No Pcp Per  Patient coming from: Home-lives with sister  Chief Complaint:  Chief Complaint  Patient presents with   Fall   HPI: John Spears is a 73 y.o. male with medical history significant of history of CVA, seizure disorder on AEDs, dilated cardiomyopathy with recovered EF, chronic kidney disease stage IIIa, dyslipidemia, atrial fibrillation on Xarelto , and BPH.  Patient was brought to the ED after experiencing a fall.  He reported to triage that he was walking to the mailbox on Monday and lost his balance.  Was complaining of pain in his right hip and showed the nurse abrasions to the back of his hand.  He apparently fell again on Tuesday but history was inconsistent and he could not confirm whether he did his head or how the fall occurred.  He admitted to drinking alcohol.  In the ED he was afebrile and vital signs were stable.  Labs were unremarkable and at baseline.  Because he takes Xarelto  and was unclear as to whether he had his had a CT of the head was obtained to rule out traumatic bleeding.  This was unremarkable in regards to acute bleeding but there was a finding of left parietal cortical encephalomalacia related to remote posterior left MCA territory infarct.  Because he was complaining of right pain a CT was obtained and this did reveal a nondisplaced right trochanteric periprosthetic fracture noting he has had a prior right hemiarthroplasty.  Hospitalist service has been asked to evaluate the patient for admission.  Review of Systems: As mentioned in the history of present illness. All other systems reviewed and are negative.  Patient continues to have inconsistent history regarding fall on Tuesday.  Initially he stated he did not think he had tripped or passed out.  Denied dizziness.  Later states he thinks he  tripped.  He is currently complaining of right hip pain.  Past Medical History:  Diagnosis Date   Alcohol abuse    Heart failure with improved ejection fraction (HFimpEF) (HCC) 02/2022   a. 02/2019 Echo: EF 60-65%, GrI DD; b. 01/2022 Echo: EF 30-35%; c. 06/2022 Echo: EF 60-65%, mild LVH, RI DD, nl RV fxn, triv MR.   Hypertension    Seizure (HCC)    Seizure disorder [facility])    Past Surgical History:  Procedure Laterality Date   JOINT REPLACEMENT     Social History:  reports that he has quit smoking. He has never used smokeless tobacco. He reports that he does not currently use alcohol after a past usage of about 84.0 standard drinks of alcohol per week. He reports that he does not currently use drugs after having used the following drugs: Marijuana.  Not on File  Family History  Problem Relation Age of Onset   Heart attack Mother 61    Prior to Admission medications   Medication Sig Start Date End Date Taking? Authorizing Provider  carvedilol  (COREG ) 12.5 MG tablet Take 1 tablet (12.5 mg total) by mouth 2 (two) times daily. 07/05/23 07/04/24 Yes Hammock, Tylene, NP  folic acid  (FOLVITE ) 1 MG tablet Take 1 mg by mouth in the morning. 07/16/22  Yes [provider]  furosemide  (LASIX ) 20 MG tablet TAKE 1 TABLET BY MOUTH ONCE A DAY 11/09/22  Yes Vivienne Lonni Ingle, NP  gabapentin  (NEURONTIN ) 100 MG capsule Take 100 mg by mouth daily.  Yes [provider]  lacosamide  (VIMPAT ) 200 MG TABS tablet Take 1 tablet (200 mg total) by mouth 2 (two) times daily. 08/31/23  Yes Jens Durand, MD  levETIRAcetam  (KEPPRA ) 750 MG tablet Take 2 tablets (1,500 mg total) by mouth 2 (two) times daily. 03/11/23 10/06/23 Yes Raenelle Coria, MD  acetaminophen  (TYLENOL ) 500 MG tablet Take 1 tablet (500 mg total) by mouth every 6 (six) hours as needed for moderate pain (pain score 4-6). 08/04/23   Dezii, Alexandra, DO  amLODipine  (NORVASC ) 10 MG tablet Take 10 mg by mouth at bedtime. 01/13/23    [provider]  ASPIRIN  LOW DOSE 81 MG tablet Take 81 mg by mouth daily. 07/05/23   [provider]  atorvastatin  (LIPITOR) 40 MG tablet Take 40 mg by mouth at bedtime.    [provider]  cyanocobalamin  (VITAMIN B12) 1000 MCG tablet Take 1,000 mcg by mouth in the morning. 11/24/22   [provider]  feeding supplement (ENSURE ENLIVE / ENSURE PLUS) LIQD Take 237 mLs by mouth 2 (two) times daily between meals. 12/30/22   Briana Elgin LABOR, MD  Multiple Vitamin (MULTIVITAMIN WITH MINERALS) TABS tablet Take 1 tablet by mouth every evening. 08/31/23   Jens Durand, MD  rivaroxaban  (XARELTO ) 20 MG TABS tablet Take 1 tablet (20 mg total) by mouth daily with supper. 05/17/23   Gerard Frederick, NP  tamsulosin  (FLOMAX ) 0.4 MG CAPS capsule Take 0.4 mg by mouth at bedtime.    [provider]  thiamine  (VITAMIN B1) 100 MG tablet Take 100 mg by mouth in the morning. 07/16/22   [provider]    Physical Exam: Vitals:   10/06/23 0530 10/06/23 0622 10/06/23 0640 10/06/23 1026  BP: (!) 156/70 (!) 133/58    Pulse: (!) 53 (!) 57  (!) 55  Resp: 15     Temp:   98.2 F (36.8 C)   TempSrc:   Oral   SpO2: 100%     Weight:      Height:       Constitutional: NAD, calm, comfortable Respiratory: clear to auscultation bilaterally, no wheezing, no crackles. Normal respiratory effort. No accessory muscle use.  Room air Cardiovascular: Regular rate and rhythm, soft systolic murmur best heard right sternal border second intercostal space, no rubs / gallops. No extremity edema. 2+ pedal pulses.  Abdomen: no tenderness, no masses palpated. No hepatosplenomegaly. Bowel sounds positive.  Musculoskeletal: no clubbing / cyanosis. No joint deformity upper and lower extremities. Good ROM, no contractures. Normal muscle tone.  Tender over right anterior femur with pain radiating to the right hip with palpation Skin: no rashes, lesions, ulcers. No induration Neurologic: CN 2-12  grossly intact. Sensation intact,  Strength 5/5 noting given pain did not assess strength in right lower extremity Psychiatric: Awake but appears somewhat confused noting he is a poor historian  Data Reviewed:  Sodium 137, potassium 4.2, CO2 20, glucose 104, BUN 17, creatinine 1.08, GFR 60  WBC 8.1 with normal differential, hemoglobin 10.4, platelets 233,000  Imaging as above  Assessment and Plan: Recurrent falls Acute periprosthetic right trochanteric nondisplaced fracture Has been evaluated by orthopedic team and recommendation is for nonsurgical treatment and has been a while WBAT Percocet for pain Early mobilization with PT/OT evaluation Lives with sister and has been noted to have frequent falls and has been to the ER 6 times this year with either falls and or breakthrough seizures.  Unclear if safe to return back to home [facility] consulted  Regular alcohol  use Patient states he drinks two 16 ounce beers daily but is a poor historian Continue CIWA protocol and utilize oral Ativan  Currently no clinical signs of withdrawal Alcohol level pending  History of CVA Continue statin and Xarelto   Atrial fibrillation Obtain EKG and continue telemetry monitoring-pulse less than 100 bpm Continue home Xarelto  Continue carvedilol   Seizure disorder Seizure precaution Continue preadmission Vimpat  and Keppra   History of dilated cardiomyopathy now with HFrecEF/hypertension Continue Norvasc , and carvedilol  Last echo was in 2024 and revealed preserved EF with positive diastolic dysfunction parameters and mild LVH  CKD 3a Current GFR greater than 60 Avoid nephrotoxins  Dyslipidemia Continue Lipitor  BPH Continue Flomax  Monitor for acute urinary retention especially with requirement for narcotics for trochanteric fracture    Advance Care Planning:   Code Status: Full Code   DVT prophylaxis: Xarelto   Consults: None  Family Communication: Patient only  Severity of Illness: The  appropriate patient status for this patient is INPATIENT. Inpatient status is judged to be reasonable and necessary in order to provide the required intensity of service to ensure the patient's safety. The patient's presenting symptoms, physical exam findings, and initial radiographic and laboratory data in the context of their chronic comorbidities is felt to place them at high risk for further clinical deterioration. Furthermore, it is not anticipated that the patient will be medically stable for discharge from the hospital within 2 midnights of admission.   * I certify that at the point of admission it is my clinical judgment that the patient will require inpatient hospital care spanning beyond 2 midnights from the point of admission due to high intensity of service, high risk for further deterioration and high frequency of surveillance required.*  Author: Isaiah Lever, NP 10/06/2023 10:37 AM  For on call review [email or web address].

## 2023-10-06 NOTE — Consult Note (Signed)
 ORTHOPAEDIC CONSULTATION  REQUESTING PHYSICIAN: Wouk, Devaughn Sayres, MD  Chief Complaint:   R hip pain  History of Present Illness: John Spears is a 73 y.o. male who had a fall at home a few days ago.  He did notice right hip pain, but was still able to ambulate.  He then had another fall and had difficulty with ambulation afterwards. The patient states he does have a walker at home but uses it intermittently. He lives at home with his sister. He has had prior right hip replacement over 10 years ago, and he cannot recall the surgeon.  Imaging in the emergency department showed a minimally displaced greater trochanteric periprosthetic fracture.  He has a medical history of CHF, alcohol abuse (daily use), seizures, cardiomyopathy, and is on Xarelto  for history of CVA.   Past Medical History:  Diagnosis Date   Alcohol abuse    Heart failure with improved ejection fraction (HFimpEF) (HCC) 02/2022   a. 02/2019 Echo: EF 60-65%, GrI DD; b. 01/2022 Echo: EF 30-35%; c. 06/2022 Echo: EF 60-65%, mild LVH, RI DD, nl RV fxn, triv MR.   Hypertension    Seizure (HCC)    Seizure disorder [facility])    Past Surgical History:  Procedure Laterality Date   JOINT REPLACEMENT     Social History   Socioeconomic History   Marital status: Single    Spouse name: Not on file   Number of children: Not on file   Years of education: Not on file   Highest education level: Not on file  Occupational History   Not on file  Tobacco Use   Smoking status: Former   Smokeless tobacco: Never  Vaping Use   Vaping status: Never Used  Substance and Sexual Activity   Alcohol use: Not Currently    Alcohol/week: 84.0 standard drinks of alcohol    Types: 84 Cans of beer per week    Comment: reported by sister 6-12 beers a day and liquor when he can get it   Drug use: Not Currently    Types: Marijuana   Sexual activity: Not on file  Other Topics Concern    Not on file  Social History Narrative   ** Merged History Encounter **       Social Drivers of Health   Financial Resource Strain: Medium Risk (04/06/2023)   Received from [facility]   Overall Financial Resource Strain (CARDIA)    Difficulty of Paying Living Expenses: Somewhat hard  Food Insecurity: No Food Insecurity (10/06/2023)   Hunger Vital Sign    Worried About Running Out of Food in the Last Year: Never true    Ran Out of Food in the Last Year: Never true  Transportation Needs: No Transportation Needs (10/06/2023)   PRAPARE - Administrator, Civil Service (Medical): No    Lack of Transportation (Non-Medical): No  Physical Activity: Not on file  Stress: Not on file  Social Connections: Moderately Isolated (10/06/2023)   Social Connection and Isolation Panel    Frequency of Communication with Friends and Family: More than three times a week    Frequency of Social Gatherings with Friends and Family: More than three times a week    Attends Religious Services: 1 to 4 times per year    Active Member of [employer] or Organizations: No    Attends Banker Meetings: Never    Marital Status: Never married   Family History  Problem Relation Age of Onset  Heart attack Mother 70   Not on File Prior to Admission medications   Medication Sig Start Date End Date Taking? Authorizing Provider  acetaminophen  (TYLENOL ) 500 MG tablet Take 1 tablet (500 mg total) by mouth every 6 (six) hours as needed for moderate pain (pain score 4-6). 08/04/23  Yes Dezii, Alexandra, DO  amLODipine  (NORVASC ) 10 MG tablet Take 10 mg by mouth at bedtime. 01/13/23  Yes [provider]  ASPIRIN  LOW DOSE 81 MG tablet Take 81 mg by mouth daily. 07/05/23  Yes [provider]  atorvastatin  (LIPITOR) 40 MG tablet Take 40 mg by mouth at bedtime.   Yes [provider]  carvedilol  (COREG ) 12.5 MG tablet Take 1 tablet (12.5 mg total) by mouth 2 (two) times  daily. 07/05/23 07/04/24 Yes Hammock, Sheri, NP  cyanocobalamin  (VITAMIN B12) 1000 MCG tablet Take 1,000 mcg by mouth in the morning. 11/24/22  Yes [provider]  folic acid  (FOLVITE ) 1 MG tablet Take 1 mg by mouth in the morning. 07/16/22  Yes [provider]  furosemide  (LASIX ) 20 MG tablet TAKE 1 TABLET BY MOUTH ONCE A DAY 11/09/22  Yes Vivienne Lonni Ingle, NP  gabapentin  (NEURONTIN ) 100 MG capsule Take 100 mg by mouth 2 (two) times daily.   Yes [provider]  Lacosamide  150 MG TABS Take 1 tablet by mouth 2 (two) times daily.   Yes [provider]  levETIRAcetam  (KEPPRA ) 750 MG tablet Take 2 tablets (1,500 mg total) by mouth 2 (two) times daily. 03/11/23 10/06/23 Yes Raenelle Coria, MD  Multiple Vitamins-Iron  (MULTIVITAMINS WITH IRON ) TABS tablet Take 1 tablet by mouth at bedtime.   Yes [provider]  rivaroxaban  (XARELTO ) 20 MG TABS tablet Take 1 tablet (20 mg total) by mouth daily with supper. 05/17/23  Yes Hammock, Sheri, NP  tamsulosin  (FLOMAX ) 0.4 MG CAPS capsule Take 0.4 mg by mouth at bedtime.   Yes [provider]  thiamine  (VITAMIN B1) 100 MG tablet Take 100 mg by mouth in the morning. 07/16/22  Yes [provider]  feeding supplement (ENSURE ENLIVE / ENSURE PLUS) LIQD Take 237 mLs by mouth 2 (two) times daily between meals. 12/30/22   Briana Elgin LABOR, MD  lacosamide  (VIMPAT ) 200 MG TABS tablet Take 1 tablet (200 mg total) by mouth 2 (two) times daily. Patient not taking: Reported on 10/06/2023 08/31/23   Jens Durand, MD  Multiple Vitamin (MULTIVITAMIN WITH MINERALS) TABS tablet Take 1 tablet by mouth every evening. Patient not taking: Reported on 10/06/2023 08/31/23   Jens Durand, MD   Recent Labs    10/06/23 0220  WBC 8.1  HGB 10.4*  HCT 32.3*  PLT 233  K 4.2  CL 105  CO2 20*  BUN 17  CREATININE 1.08  GLUCOSE 104*  CALCIUM  9.3   CT Hip Right Wo Contrast Result Date: 10/06/2023 CLINICAL DATA:  Recent fall  with hip pain, initial encounter EXAM: CT OF THE RIGHT HIP WITHOUT CONTRAST TECHNIQUE: Multidetector CT imaging of the right hip was performed according to the standard protocol. Multiplanar CT image reconstructions were also generated. RADIATION DOSE REDUCTION: This exam was performed according to the departmental dose-optimization program which includes automated exposure control, adjustment of the mA and/or kV according to patient size and/or use of iterative reconstruction technique. COMPARISON:  Plain film from the previous day. FINDINGS: Bones/Joint/Cartilage Changes consistent with right hemiarthroplasty are seen. No dislocation is noted. There is a periprosthetic fracture identified involving the greater trochanter. No significant displacement of the  fracture fragment is seen. Ligaments Suboptimally assessed by CT. Muscles and Tendons Surrounding musculature appears within normal limits. Soft tissues No acute soft tissue abnormality is noted. IMPRESSION: Undisplaced greater trochanter fracture similar to that seen on recent plain film examination. Electronically Signed   By: Oneil Devonshire M.D.   On: 10/06/2023 03:15   DG Hip Unilat W or Wo Pelvis 2-3 Views Right Result Date: 10/05/2023 CLINICAL DATA:  Fall, right hip pain EXAM: DG HIP (WITH OR WITHOUT PELVIS) 2-3V RIGHT COMPARISON:  None Available. FINDINGS: Surgical changes of right hip bipolar hemiarthroplasty are identified. There is lucency within the greater trochanter and a a nondisplaced periprosthetic greater trochanter fracture is suspected. No other fracture identified. No dislocation. Mild left hip degenerative arthritis. Soft tissues are. IMPRESSION: 1. Suspected nondisplaced periprosthetic greater trochanter fracture. This could be confirmed with CT imaging. Electronically Signed   By: Dorethia Molt M.D.   On: 10/05/2023 20:03   CT Head Wo Contrast Result Date: 10/05/2023 CLINICAL DATA:  fall EXAM: CT HEAD WITHOUT CONTRAST TECHNIQUE:  Contiguous axial images were obtained from the base of the skull through the vertex without intravenous contrast. RADIATION DOSE REDUCTION: This exam was performed according to the departmental dose-optimization program which includes automated exposure control, adjustment of the mA and/or kV according to patient size and/or use of iterative reconstruction technique. COMPARISON:  None Available. FINDINGS: Brain: Normal anatomic configuration. Parenchymal volume loss is commensurate with the patient's age. Mild periventricular white matter changes are present likely reflecting the sequela of small vessel ischemia. Left parietal cortical encephalomalacia related to posterior left MCA territory infarct noted. No abnormal intra or extra-axial mass lesion or fluid collection. No abnormal mass effect or midline shift. No evidence of acute intracranial hemorrhage or infarct. Ventricular size is normal. Cerebellum unremarkable. Vascular: No asymmetric hyperdense vasculature at the skull base. Skull: Intact Sinuses/Orbits: Paranasal sinuses are clear. Orbits are unremarkable. Other: Mastoid air cells and middle ear cavities are clear. IMPRESSION: 1. No acute intracranial abnormality. No calvarial fracture. 2. Left parietal cortical encephalomalacia related to remote posterior left MCA territory infarct. 3. Mild senescent change. Electronically Signed   By: Dorethia Molt M.D.   On: 10/05/2023 20:02     Positive ROS: All other systems have been reviewed and were otherwise negative with the exception of those mentioned in the HPI and as above.  Physical Exam: BP (!) 133/58   Pulse (!) 57   Temp 98.2 F (36.8 C) (Oral)   Resp 15   Ht 5' 11 (1.803 m)   Wt 76.2 kg   SpO2 100%   BMI 23.43 kg/m  General:  Alert, no acute distress Psychiatric:  Patient is competent for consent with normal mood and affect    Orthopedic Exam:  RLE: 5/5 DF/PF/EHL SILT grossly over foot Foot wwp Negative Log roll/axial  load There is significant tenderness to palpation about the greater trochanter Able to flex hip to 50 degrees with gentle IR/ER with relatively mild pain  Imaging:  As above: R minimally displaced greater trochanteric periprosthetic hip fracture   Assessment/Plan: John Spears is a 73 y.o. male with a R minimally displaced greater trochanteric periprosthetic hip fracture  1.  We discussed the diagnosis as well as various treatment options.  Given that the stem appears stable and the fracture of the greater trochanter is minimally displaced, we agreed to proceed with further nonsurgical management.  We discussed that over time this should heal and his pain should continue to improve.  2.  Patient may weight-bear as tolerated on the right lower extremity.  Avoid active hip abduction x 6 weeks.  3.  Will plan to follow peripherally.  Please page with any further questions.  Patient may follow-up with [facility] clinic orthopedics in approximately 2 weeks.   Earnestine Blanch   10/06/2023 8:39 AM

## 2023-10-06 NOTE — Progress Notes (Signed)
 Eeg done

## 2023-10-07 DIAGNOSIS — M978XXA Periprosthetic fracture around other internal prosthetic joint, initial encounter: Secondary | ICD-10-CM | POA: Diagnosis not present

## 2023-10-07 DIAGNOSIS — Z96649 Presence of unspecified artificial hip joint: Secondary | ICD-10-CM | POA: Diagnosis not present

## 2023-10-07 MED ORDER — LORAZEPAM 2 MG/ML IJ SOLN
4.0000 mg | INTRAMUSCULAR | Status: DC
  Filled 2023-10-07 (×3): qty 2

## 2023-10-07 MED ORDER — MIDAZOLAM HCL (PF) 5 MG/ML IJ SOLN
10.0000 mg | INTRAMUSCULAR | Status: AC
  Administered 2023-10-07: 10 mg via INTRAMUSCULAR
  Filled 2023-10-07 (×3): qty 2

## 2023-10-07 NOTE — Progress Notes (Signed)
 Rapid response called at 1440 for patient. Upon entering room patient found to be having a seizure. IV access lost and multiple attempts to start were unsuccessful. IM medication ordered and given. Monitoring and follow up instruction given by MD and followed. Patient responded to medication appropriately. VS stable.

## 2023-10-07 NOTE — Significant Event (Signed)
 Rapid Response Event Note   Reason for Call :   Seizure Initial Focused Assessment:   Arrived to patient's room and patient was having a seizure.  Pt vitals stable.  Dr. Kandis at the bedside.  Patient did not have IV access.    Interventions:  Attempted to place an IV- unsuccessful.  IV team consulted. Dr. Kandis ordered for Versed  IM injection.     Plan of Care:  Dr. Kandis ordered to continue to monitor the patient and vitals on the floor.   Event Summary:   MD Notified:  Call Time: Arrival Time: End Time:  Allena JINNY Gunther, RN

## 2023-10-07 NOTE — Progress Notes (Signed)
   10/07/23 1430  Spiritual Encounters  Type of Visit Attempt (pt unavailable)  Care provided to: Pt not available  Conversation partners present during encounter Nurse  Reason for visit Code  OnCall Visit Yes   Chaplain responded to a RRT to this patient's room. No family present.  Staff got the patient stabilized so, Chaplain will evening Chaplain know to see if she may follow-up with patient later on and see how he's doing.    Rev. Rana M. Nicholaus, M.Div. Chaplain Resident  [facility]

## 2023-10-07 NOTE — Progress Notes (Signed)
 Physical Therapy Treatment Patient Details Name: John Spears MRN: [id number] DOB: 1950/07/03 Today's Date: 10/07/2023   History of Present Illness 73 y.o. male s/p recent fall and subsequent right hip pain, but was still able to ambulate.  He then had another fall and had difficulty with ambulation afterwards.  He has had prior right hip replacement over 10 years ago, and he cannot recall the surgeon.  Imaging in the emergency department showed a minimally displaced greater trochanteric periprosthetic fracture - no plans for surgerical intervention.  PMH:  Seizures.    PT Comments  Pt sitting on EOB with OT upon PT arrival; PT/OT co-treatment performed.  In sitting, pt noted with mild L lean (d/t R hip pain), mildly shaky, mild impaired ability to follow cues/sequence, and mild decreased speech quality but pt alert and participating in sessions activities.  Pt SBA to CGA sitting edge of bed; min assist x2 to stand from bed up to RW; min assist x1 to ambulate a couple feet with RW use (close chair follow for safety; short shuffling gait noted); and mod assist x2 to sit down in recliner.  Pt c/o 10/10 R hip pain post ambulation (from 0/10 pre-activity)--OT messaged nursing regarding pt requesting pain medication.  Pt noted with gradual increasing L lean and closing his eyes sitting in recliner but still responsive (pt looking up with eyes but eyes not rolled back when pt did open eyes for therapists).  Therapists prepared pt for transfer back to bed d/t above noted concerns.  Pt then looking even more lethargic with significant L lean--pt able to state name but did not open his eyes.  Pt then became unresponsive and pt appearing stiff/flexed with B UE's, shaking, and tongue protruding slightly.  Nursing notified and rapid response called.  Performed dependent 3 person squat pivot transfer recliner to bed (to L side) and pt layed on L side.  MD and rapid response team arrived and updated on pt's  status/symptoms; vitals taken; and MD and rapid response team attended to pt's needs.   If plan is discharge home, recommend the following: A lot of help with walking and/or transfers;A little help with bathing/dressing/bathroom;Assist for transportation;Help with stairs or ramp for entrance   Can travel by private vehicle     No  Equipment Recommendations  Rolling walker (2 wheels)    Recommendations for Other Services       Precautions / Restrictions Precautions Precautions: Fall; Seizure precautions Recall of Precautions/Restrictions: Intact Restrictions Weight Bearing Restrictions Per Provider Order: Yes RLE Weight Bearing Per Provider Order: Weight bearing as tolerated Other Position/Activity Restrictions: Per ortho consult R LE is WBAT for non surgical fx. No active hip abduction x 6 wks.     Mobility  Bed Mobility Overal bed mobility: Needs Assistance Bed Mobility: Sit to Sidelying         Sit to sidelying:  (+3 assist back to bed d/t pt unresponsive) General bed mobility comments: 3 assist for safety getting back to bed d/t pt unresponsive; pt placed on L side    Transfers Overall transfer level: Needs assistance Equipment used: Rolling walker (2 wheels) Transfers: Sit to/from Stand, Bed to chair/wheelchair/BSC Sit to Stand: Min assist, Mod assist, +2 physical assistance     Squat pivot transfers: Total assist (x3 assist)     General transfer comment: Min assist x2 to stand from bed up to RW; mod assist x2 to safely sit down/control descent sitting in recliner; 3 dependent assist transfer recliner to bed  towards L side    Ambulation/Gait Ambulation/Gait assistance: Min assist, +2 safety/equipment (2nd assist chair follow) Gait Distance (Feet): 2 Feet Assistive device: Rolling walker (2 wheels)   Gait velocity: decreased     General Gait Details: antalgic; decreased stance time R LE; very short shuffling steps; limited d/t pain   Stairs              Wheelchair Mobility     Tilt Bed    Modified Rankin (Stroke Patients Only)       Balance Overall balance assessment: Needs assistance Sitting-balance support: Bilateral upper extremity supported, Feet supported Sitting balance-Leahy Scale: Fair Sitting balance - Comments: CGA to close SBA for sitting balance edge of bed   Standing balance support: Bilateral upper extremity supported, Reliant on assistive device for balance Standing balance-Leahy Scale: Fair Standing balance comment: CGA for safety static standing with B UE support on RW                            Communication Communication Communication: Impaired Factors Affecting Communication: Difficulty expressing self;Reduced clarity of speech  Cognition Arousal: Alert (Alert beginning of session; unresponsive end of session) Behavior During Therapy: Flat affect   PT - Cognitive impairments: Awareness, Attention, Initiation, Sequencing, Problem solving, Safety/Judgement                       PT - Cognition Comments: A&Ox4 beginning of session Following commands: Impaired Following commands impaired: Follows one step commands with increased time, Follows multi-step commands inconsistently    Cueing Cueing Techniques: Verbal cues  Exercises      General Comments        Pertinent Vitals/Pain Pain Assessment Pain Assessment: Faces Faces Pain Scale: Hurts worst Pain Location: R LE Pain Descriptors / Indicators: Aching, Discomfort, Grimacing, Guarding, Sharp Pain Intervention(s): Limited activity within patient's tolerance, Monitored during session, Repositioned, Patient requesting pain meds-RN notified Pt's vitals (laying in bed with MD and rapid response team present): BP 131/75 (MAP 91) with HR 68 bpm and SpO2 sats 99% on room air.    Home Living                          Prior Function            PT Goals (current goals can now be found in the care plan section) Acute  Rehab PT Goals Patient Stated Goal: get walking better PT Goal Formulation: With patient Time For Goal Achievement: 10/19/23 Potential to Achieve Goals: Fair Progress towards PT goals: Not progressing toward goals - comment (d/t pt becoming unresponsive during session)    Frequency    Min 2X/week      PT Plan      Co-evaluation PT/OT/SLP Co-Evaluation/Treatment: Yes Reason for Co-Treatment: For patient/therapist safety;To address functional/ADL transfers PT goals addressed during session: Mobility/safety with mobility;Balance;Proper use of DME OT goals addressed during session: ADL's and self-care      AM-PAC PT 6 Clicks Mobility   Outcome Measure  Help needed turning from your back to your side while in a flat bed without using bedrails?: A Little Help needed moving from lying on your back to sitting on the side of a flat bed without using bedrails?: A Little Help needed moving to and from a bed to a chair (including a wheelchair)?: A Lot Help needed standing up from a chair using your arms (e.g., wheelchair  or bedside chair)?: A Lot Help needed to walk in hospital room?: Total Help needed climbing 3-5 steps with a railing? : Total 6 Click Score: 12    End of Session Equipment Utilized During Treatment: Gait belt Activity Tolerance: Treatment limited secondary to medical complications (Comment) (Pt became unresponsive during session: rapid response called) Patient left: in bed;Other (comment) (Nursing, MD, and rapid response team present attending to pt) Nurse Communication: Mobility status;Other (comment) (pt's symptoms and need for immediate attention) PT Visit Diagnosis: Unsteadiness on feet (R26.81);Muscle weakness (generalized) (M62.81);Difficulty in walking, not elsewhere classified (R26.2)     Time: 8578-8554 PT Time Calculation (min) (ACUTE ONLY): 24 min  Charges:    $Therapeutic Activity: 8-22 mins PT General Charges $$ ACUTE PT VISIT: 1 Visit                      Damien Caulk, PT 10/07/23, 3:40 PM

## 2023-10-07 NOTE — Progress Notes (Signed)
 Responded to overhead page of rapid response. On arrival RRT RN resp therapy present. Dr. Kandis present. Pt has pulse maintaining airway, does not respond to pain or verbal questions.

## 2023-10-07 NOTE — Progress Notes (Signed)
 Two 2mg /53ml vials of Ativan  returned to pharmacist. Pharmacist said there was no sign off sheet for returning the Ativan  that was delivered to floor earlier today for patient seizure activity. Pharmacist stated it would be returned to his pyxis.

## 2023-10-07 NOTE — Progress Notes (Signed)
 Occupational Therapy Treatment Patient Details Name: John Spears MRN: [id number] DOB: [birth year] Today's Date: 10/07/2023   History of present illness 73 y.o. male s/p recent fall and subsequent right hip pain, but was still able to ambulate.  He then had another fall and had difficulty with ambulation afterwards.  He has had prior right hip replacement over 10 years ago, and he cannot recall the surgeon.  Imaging in the emergency department showed a minimally displaced greater trochanteric periprosthetic fracture - no plans for surgerical intervention.  PMH:  Seizures.   OT comments  Pt seen for OT tx, overlapping with PT for ADL mobility. Pt received leaning slightly towards L side in bed, eating lunch. Notable spillage and pt very mildly tremulous. Pt agreeable to sitting EOB to finish meal for improved positioning and safety. Pt required MOD-MAX A for sup>sit EOB and initial MIN A improving to CGA-SBA for sitting balance. Pt able to finish meal (only took 2 more bites) and then engaged in some dynamic reaching with mild difficulty noted in sequencing and following commands with impaired processing. Once PT arrived, MIN A +2 for STS with RW and able to take a couple shuffled pain limited steps with heavy BUE reliance on RW but demonstrating difficulty with sequencing as PT provided cues to improve comfort of R hip. Pt returned to sitting with MOD A +2 2/2 pain. Secure chat sent to RN for pain medication.  Pt noted with gradual increasing L lean and closing his eyes sitting in recliner but still responsive (pt looking up with eyes but eyes not rolled back when pt did open eyes for therapists). Therapists prepared pt for transfer back to bed d/t above noted concerns. Pt then looking even more lethargic with significant L lean--pt able to state name but did not open his eyes. Pt then became unresponsive and pt appearing stiff/flexed with B UE's, shaking, and tongue protruding slightly. Nursing  notified and rapid response called. Performed dependent 3 person squat pivot transfer recliner to bed and pt layed on L side. MD and rapid response team arrived and updated on pt's status/symptoms and attended to pt's needs. Pt left with rapid response team for further assessment and treatment. Will continue to follow as medically appropriate.       If plan is discharge home, recommend the following:  Two people to help with walking and/or transfers;A lot of help with bathing/dressing/bathroom;Assistance with cooking/housework;Help with stairs or ramp for entrance;Assist for transportation   Equipment Recommendations  Other (comment) (defer)    Recommendations for Other Services      Precautions / Restrictions Precautions Precautions: Fall Recall of Precautions/Restrictions: Intact Precaution/Restrictions Comments: seizures, CIWA Restrictions Weight Bearing Restrictions Per Provider Order: Yes RLE Weight Bearing Per Provider Order: Weight bearing as tolerated Other Position/Activity Restrictions: Per ortho consult R LE is WBAT for non surgical fx. No active hip abduction x 6 wks.       Mobility Bed Mobility Overal bed mobility: Needs Assistance Bed Mobility: Sit to Sidelying, Supine to Sit     Supine to sit: Mod assist, Max assist, HOB elevated, Used rails   Sit to sidelying:  (+3 assist back to bed d/t pt unresponsive) General bed mobility comments: 3 assist for safety getting back to bed d/t pt unresponsive; pt placed on L side    Transfers Overall transfer level: Needs assistance Equipment used: Rolling walker (2 wheels) Transfers: Sit to/from Stand, Bed to chair/wheelchair/BSC Sit to Stand: Min assist, Mod assist, +2 physical assistance  Squat pivot transfers: Total assist (x3 assist)       General transfer comment: Min assist x2 to stand from bed up to RW; mod assist x2 to safely sit down/control descent sitting in recliner; 3 dependent assist transfer recliner to  bed towards L side     Balance Overall balance assessment: Needs assistance Sitting-balance support: Bilateral upper extremity supported, Feet supported Sitting balance-Leahy Scale: Fair Sitting balance - Comments: initially poor+ improving to fair requiring CGA to close SBA for sitting balance edge of bed and gradually declinging to leavy L lateral lean with pt citing R hip pain as reason Postural control: Left lateral lean Standing balance support: Bilateral upper extremity supported, Reliant on assistive device for balance Standing balance-Leahy Scale: Fair Standing balance comment: CGA for safety static standing with B UE support on RW                           ADL either performed or assessed with clinical judgement   ADL Overall ADL's : Needs assistance/impaired Eating/Feeding: Sitting;Supervision/ safety;Set up   Grooming: Sitting;Set up;Wash/dry face;Contact guard assist                                      Extremity/Trunk Assessment              Vision       Perception     Praxis     Communication Communication Communication: Impaired Factors Affecting Communication: Difficulty expressing self;Reduced clarity of speech   Cognition Arousal: Alert (Alert beginning of session; unresponsive end of session) Behavior During Therapy: Flat affect               OT - Cognition Comments: alert at start of session, increasingly lethargic after mobility requiring multimodal cues to improve alertness and eventually unresponsive                 Following commands: Impaired Following commands impaired: Follows one step commands with increased time, Follows multi-step commands inconsistently      Cueing   Cueing Techniques: Verbal cues  Exercises      Shoulder Instructions       General Comments Vitals once in sidelying after RR called: HR 56-68, SpO2 100%, BP 131/75 (91).    Pertinent Vitals/ Pain       Pain  Assessment Pain Assessment: Faces Faces Pain Scale: Hurts worst Pain Location: R LE with mobility, 0/10 at rest prior to mobility Pain Descriptors / Indicators: Aching, Discomfort, Grimacing, Guarding, Sharp Pain Intervention(s): Limited activity within patient's tolerance, Monitored during session, Repositioned, Patient requesting pain meds-RN notified  Home Living                                          Prior Functioning/Environment              Frequency  Min 2X/week        Progress Toward Goals  OT Goals(current goals can now be found in the care plan section)  Progress towards OT goals: OT to reassess next treatment (limited this date by rapid response)  Acute Rehab OT Goals Patient Stated Goal: to decrease pain OT Goal Formulation: With patient Time For Goal Achievement: 10/20/23 Potential to Achieve Goals: Fair  Plan  Co-evaluation    PT/OT/SLP Co-Evaluation/Treatment: Yes Reason for Co-Treatment: For patient/therapist safety;To address functional/ADL transfers PT goals addressed during session: Mobility/safety with mobility;Balance;Proper use of DME OT goals addressed during session: ADL's and self-care      AM-PAC OT 6 Clicks Daily Activity     Outcome Measure   Help from another person eating meals?: None Help from another person taking care of personal grooming?: A Lot Help from another person toileting, which includes using toliet, bedpan, or urinal?: Total Help from another person bathing (including washing, rinsing, drying)?: A Lot Help from another person to put on and taking off regular upper body clothing?: A Lot Help from another person to put on and taking off regular lower body clothing?: Total 6 Click Score: 12    End of Session Equipment Utilized During Treatment: Gait belt;Rolling walker (2 wheels)  OT Visit Diagnosis: Unsteadiness on feet (R26.81);Repeated falls (R29.6);Muscle weakness (generalized) (M62.81)    Activity Tolerance Treatment limited secondary to medical complications (Comment);Patient limited by pain (seizure like activity resulting in rapid response)   Patient Left in bed;with call bell/phone within reach;with nursing/sitter in room;Other (comment) (rapid response team in room for further evaluation/tx)   Nurse Communication Other (comment);Mobility status;Patient requests pain meds (pt gradually becoming more lethargic, rapid called)        Time: 8590-8555 OT Time Calculation (min): 35 min  Charges: OT General Charges $OT Visit: 1 Visit OT Treatments $Self Care/Home Management : 8-22 mins  Warren SAUNDERS., MPH, MS, OTR/L ascom [phone] 10/07/23, 3:39 PM

## 2023-10-07 NOTE — Progress Notes (Signed)
 PROGRESS NOTE    John Spears  FMW:[id number] DOB: 1950-10-27 DOA: 10/06/2023 PCP: Patient, No Pcp Per     Brief Narrative:   From admission h and p  John Spears is a 73 y.o. male with medical history significant of history of CVA, seizure disorder on AEDs, dilated cardiomyopathy with recovered EF, chronic kidney disease stage IIIa, dyslipidemia, atrial fibrillation on Xarelto , and BPH.  Patient was brought to the ED after experiencing a fall.  He reported to triage that he was walking to the mailbox on Monday and lost his balance.  Was complaining of pain in his right hip and showed the nurse abrasions to the back of his hand.  He apparently fell again on Tuesday but history was inconsistent and he could not confirm whether he did his head or how the fall occurred.  He admitted to drinking alcohol.  In the ED he was afebrile and vital signs were stable.  Labs were unremarkable and at baseline.  Because he takes Xarelto  and was unclear as to whether he had his had a CT of the head was obtained to rule out traumatic bleeding.  This was unremarkable in regards to acute bleeding but there was a finding of left parietal cortical encephalomalacia related to remote posterior left MCA territory infarct.  Because he was complaining of right pain a CT was obtained and this did reveal a nondisplaced right trochanteric periprosthetic fracture noting he has had a prior right hemiarthroplasty.  Hospitalist service has been asked to evaluate the patient for admission.   Assessment & Plan:   Principal Problem:   Periprosthetic intertrochanteric fracture of femur Active Problems:   Atrial fibrillation (HCC)   Essential hypertension   Chronic diastolic CHF (congestive heart failure) (HCC)   BPH (benign prostatic hyperplasia)   Alcohol abuse   Cerebrovascular accident (CVA) (HCC)   Seizures (HCC)  Recurrent falls Acute periprosthetic right trochanteric nondisplaced fracture Has been evaluated  by orthopedic team and recommendation is for nonsurgical treatment and has been a while WBAT Percocet for pain Early mobilization with PT/OT evaluation, advising snf Lives with sister and has been noted to have frequent falls and has been to the ER 6 times this year with either falls and or breakthrough seizures.   TOC consulted for snf placement  Seizure disorder With seizure today, rapid response.  Continue preadmission Vimpat  and Keppra  IM midazolam  as lost IV access Will touch base with neurology  Alcohol abuse Patient states he drinks two 16 ounce beers daily but is a poor historian Continue CIWA protocol and utilize oral Ativan  Mild withdrawal symptoms today   History of CVA Continue statin and Xarelto    Atrial fibrillation Obtain EKG and continue telemetry monitoring-pulse less than 100 bpm Continue home Xarelto  Continue carvedilol    History of dilated cardiomyopathy now with HFrecEF/hypertension Continue Norvasc , and carvedilol  Last echo was in 2024 and revealed preserved EF with positive diastolic dysfunction parameters and mild LVH   CKD 3a Current GFR greater than 60 Avoid nephrotoxins   Dyslipidemia Continue Lipitor   BPH Continue Flomax  Monitor for acute urinary retention especially with requirement for narcotics for trochanteric fracture    DVT prophylaxis: apixaban  Code Status: full Family Communication: sister updated telephonically 8/13  Level of care: Telemetry Medical Status is: Inpatient Remains inpatient appropriate because: severity of illness    Consultants:  none  Procedures: none  Antimicrobials:  none    Subjective: Mild withdrawal symptoms this morning. Went unresponsive when working with pt  Objective: Vitals:   10/06/23 2008 10/07/23 0230 10/07/23 0449 10/07/23 0826  BP: (!) 146/77 138/67 (!) 129/90 (!) 140/99  Pulse: 62 64 62 (!) 56  Resp: 18 17 16 16   Temp: 98.3 F (36.8 C) 98.2 F (36.8 C) 97.6 F (36.4 C) 97.8  F (36.6 C)  TempSrc:  Oral    SpO2: 100% 100% 100% 100%  Weight:      Height:        Intake/Output Summary (Last 24 hours) at 10/07/2023 1446 Last data filed at 10/07/2023 0900 Gross per 24 hour  Intake 360 ml  Output --  Net 360 ml   Filed Weights   10/05/23 1921  Weight: 76.2 kg    Examination:  General exam: Appears calm. Not responsive, generalized tremor Respiratory system: Clear to auscultation. Respiratory effort normal. Cardiovascular system: S1 & S2 heard, RRR.   Gastrointestinal system: Abdomen is nondistended, soft and nontender.   Central nervous system: not responsive. Body tremor Extremities: warm, no edema Skin: No rashes, lesions or ulcers Psychiatry: unable to assess    Data Reviewed: I have personally reviewed following labs and imaging studies  CBC: Recent Labs  Lab 10/06/23 0220  WBC 8.1  NEUTROABS 5.5  HGB 10.4*  HCT 32.3*  MCV 84.1  PLT 233   Basic Metabolic Panel: Recent Labs  Lab 10/06/23 0220  NA 137  K 4.2  CL 105  CO2 20*  GLUCOSE 104*  BUN 17  CREATININE 1.08  CALCIUM  9.3   GFR: Estimated Creatinine Clearance: 66.8 mL/min (by C-G formula based on SCr of 1.08 mg/dL). Liver Function Tests: No results for input(s): AST, ALT, ALKPHOS, BILITOT, PROT, ALBUMIN in the last 168 hours. No results for input(s): LIPASE, AMYLASE in the last 168 hours. No results for input(s): AMMONIA in the last 168 hours. Coagulation Profile: No results for input(s): INR, PROTIME in the last 168 hours. Cardiac Enzymes: No results for input(s): CKTOTAL, CKMB, CKMBINDEX, TROPONINI in the last 168 hours. BNP (last 3 results) No results for input(s): PROBNP in the last 8760 hours. HbA1C: No results for input(s): HGBA1C in the last 72 hours. CBG: No results for input(s): GLUCAP in the last 168 hours. Lipid Profile: No results for input(s): CHOL, HDL, LDLCALC, TRIG, CHOLHDL, LDLDIRECT in the last 72  hours. Thyroid Function Tests: No results for input(s): TSH, T4TOTAL, FREET4, T3FREE, THYROIDAB in the last 72 hours. Anemia Panel: Recent Labs    10/06/23 1648  VITAMINB12 482   Urine analysis:    Component Value Date/Time   COLORURINE STRAW (A) 08/26/2023 0857   APPEARANCEUR CLEAR (A) 08/26/2023 0857   LABSPEC 1.025 08/26/2023 0857   PHURINE 6.0 08/26/2023 0857   GLUCOSEU NEGATIVE 08/26/2023 0857   HGBUR NEGATIVE 08/26/2023 0857   BILIRUBINUR NEGATIVE 08/26/2023 0857   KETONESUR NEGATIVE 08/26/2023 0857   PROTEINUR NEGATIVE 08/26/2023 0857   NITRITE NEGATIVE 08/26/2023 0857   LEUKOCYTESUR NEGATIVE 08/26/2023 0857   Sepsis Labs: @LABRCNTIP (procalcitonin:4,lacticidven:4)  )No results found for this or any previous visit (from the past 240 hours).       Radiology Studies: CT Hip Right Wo Contrast Result Date: 10/06/2023 CLINICAL DATA:  Recent fall with hip pain, initial encounter EXAM: CT OF THE RIGHT HIP WITHOUT CONTRAST TECHNIQUE: Multidetector CT imaging of the right hip was performed according to the standard protocol. Multiplanar CT image reconstructions were also generated. RADIATION DOSE REDUCTION: This exam was performed according to the departmental dose-optimization program which includes automated exposure control, adjustment of the mA  and/or kV according to patient size and/or use of iterative reconstruction technique. COMPARISON:  Plain film from the previous day. FINDINGS: Bones/Joint/Cartilage Changes consistent with right hemiarthroplasty are seen. No dislocation is noted. There is a periprosthetic fracture identified involving the greater trochanter. No significant displacement of the fracture fragment is seen. Ligaments Suboptimally assessed by CT. Muscles and Tendons Surrounding musculature appears within normal limits. Soft tissues No acute soft tissue abnormality is noted. IMPRESSION: Undisplaced greater trochanter fracture similar to that seen on  recent plain film examination. Electronically Signed   By: Oneil Devonshire M.D.   On: 10/06/2023 03:15   DG Hip Unilat W or Wo Pelvis 2-3 Views Right Result Date: 10/05/2023 CLINICAL DATA:  Fall, right hip pain EXAM: DG HIP (WITH OR WITHOUT PELVIS) 2-3V RIGHT COMPARISON:  None Available. FINDINGS: Surgical changes of right hip bipolar hemiarthroplasty are identified. There is lucency within the greater trochanter and a a nondisplaced periprosthetic greater trochanter fracture is suspected. No other fracture identified. No dislocation. Mild left hip degenerative arthritis. Soft tissues are. IMPRESSION: 1. Suspected nondisplaced periprosthetic greater trochanter fracture. This could be confirmed with CT imaging. Electronically Signed   By: Dorethia Molt M.D.   On: 10/05/2023 20:03   CT Head Wo Contrast Result Date: 10/05/2023 CLINICAL DATA:  fall EXAM: CT HEAD WITHOUT CONTRAST TECHNIQUE: Contiguous axial images were obtained from the base of the skull through the vertex without intravenous contrast. RADIATION DOSE REDUCTION: This exam was performed according to the departmental dose-optimization program which includes automated exposure control, adjustment of the mA and/or kV according to patient size and/or use of iterative reconstruction technique. COMPARISON:  None Available. FINDINGS: Brain: Normal anatomic configuration. Parenchymal volume loss is commensurate with the patient's age. Mild periventricular white matter changes are present likely reflecting the sequela of small vessel ischemia. Left parietal cortical encephalomalacia related to posterior left MCA territory infarct noted. No abnormal intra or extra-axial mass lesion or fluid collection. No abnormal mass effect or midline shift. No evidence of acute intracranial hemorrhage or infarct. Ventricular size is normal. Cerebellum unremarkable. Vascular: No asymmetric hyperdense vasculature at the skull base. Skull: Intact Sinuses/Orbits: Paranasal  sinuses are clear. Orbits are unremarkable. Other: Mastoid air cells and middle ear cavities are clear. IMPRESSION: 1. No acute intracranial abnormality. No calvarial fracture. 2. Left parietal cortical encephalomalacia related to remote posterior left MCA territory infarct. 3. Mild senescent change. Electronically Signed   By: Dorethia Molt M.D.   On: 10/05/2023 20:02        Scheduled Meds:  amLODipine   10 mg Oral QHS   aspirin  EC  81 mg Oral Daily   atorvastatin   40 mg Oral QHS   carvedilol   12.5 mg Oral BID   cyanocobalamin   1,000 mcg Oral q AM   folic acid   1 mg Oral q AM   gabapentin   100 mg Oral BID   lacosamide   150 mg Oral BID   levETIRAcetam   1,500 mg Oral BID   midazolam  PF  10 mg Intramuscular STAT   multivitamin with minerals  1 tablet Oral Daily   multivitamins with iron   1 tablet Oral QHS   rivaroxaban   20 mg Oral Q supper   sodium chloride  flush  3 mL Intravenous Q12H   tamsulosin   0.4 mg Oral QHS   thiamine   100 mg Oral Daily   Or   thiamine   100 mg Intravenous Daily   Continuous Infusions:   LOS: 1 day     Devaughn KATHEE Ban,  MD [facility]   If 7PM-7AM, please contact night-coverage [email or web address] Password [facility] 10/07/2023, 2:46 PM

## 2023-10-07 NOTE — Plan of Care (Signed)
   Problem: Clinical Measurements: Goal: Ability to maintain clinical measurements within normal limits will improve Outcome: Progressing

## 2023-10-08 ENCOUNTER — Inpatient Hospital Stay

## 2023-10-08 DIAGNOSIS — Z96649 Presence of unspecified artificial hip joint: Secondary | ICD-10-CM | POA: Diagnosis not present

## 2023-10-08 DIAGNOSIS — M978XXA Periprosthetic fracture around other internal prosthetic joint, initial encounter: Secondary | ICD-10-CM | POA: Diagnosis not present

## 2023-10-08 LAB — RESP PANEL BY RT-PCR (RSV, FLU A&B, COVID)  RVPGX2
Influenza A by PCR: NEGATIVE
Influenza B by PCR: NEGATIVE
Resp Syncytial Virus by PCR: NEGATIVE
SARS Coronavirus 2 by RT PCR: NEGATIVE

## 2023-10-08 LAB — BASIC METABOLIC PANEL WITH GFR
Anion gap: 6 (ref 5–15)
BUN: 16 mg/dL (ref 8–23)
CO2: 24 mmol/L (ref 22–32)
Calcium: 8.9 mg/dL (ref 8.9–10.3)
Chloride: 110 mmol/L (ref 98–111)
Creatinine, Ser: 1.1 mg/dL (ref 0.61–1.24)
GFR, Estimated: >60 mL/min (ref >60)
Glucose, Bld: 97 mg/dL (ref 70–99)
Potassium: 3.8 mmol/L (ref 3.5–5.1)
Sodium: 140 mmol/L (ref 135–145)

## 2023-10-08 LAB — PROCALCITONIN: Procalcitonin: <0.1 ng/mL

## 2023-10-08 MED ORDER — SENNA 8.6 MG PO TABS
1.0000 | ORAL_TABLET | Freq: Every day | ORAL | Status: DC
  Administered 2023-10-08 – 2023-10-13 (×6): 8.6 mg via ORAL
  Filled 2023-10-08 (×6): qty 1

## 2023-10-08 MED ORDER — POLYETHYLENE GLYCOL 3350 17 G PO PACK
34.0000 g | PACK | Freq: Every day | ORAL | Status: DC
  Administered 2023-10-08 – 2023-10-13 (×6): 34 g via ORAL
  Filled 2023-10-08 (×6): qty 2

## 2023-10-08 NOTE — Plan of Care (Signed)
 AOx 3. VSS. CIWA protocol followed. Ativan  1 mg  given once. Patient c/o pain to right hip - Percocet 1x. Patient assisted to stand at bedside 2x assist to urinate. Had incontinence. Unsteady using walker- 2x assist needed. Bath, linen change during night. No seizure activity during the night. Seizure padding in place. Safety measures in place throughout the night.  Problem: Coping: Goal: Level of anxiety will decrease Outcome: Progressing   Problem: Safety: Goal: Ability to remain free from injury will improve Outcome: Progressing

## 2023-10-08 NOTE — Progress Notes (Addendum)
 PROGRESS NOTE    John Spears  FMW:[id number] DOB: January 14, 1951 DOA: 10/06/2023 PCP: Patient, No Pcp Per     Brief Narrative:   From admission h and p  John Spears is a 73 y.o. male with medical history significant of history of CVA, seizure disorder on AEDs, dilated cardiomyopathy with recovered EF, chronic kidney disease stage IIIa, dyslipidemia, atrial fibrillation on Xarelto , and BPH.  Patient was brought to the ED after experiencing a fall.  He reported to triage that he was walking to the mailbox on Monday and lost his balance.  Was complaining of pain in his right hip and showed the nurse abrasions to the back of his hand.  He apparently fell again on Tuesday but history was inconsistent and he could not confirm whether he did his head or how the fall occurred.  He admitted to drinking alcohol.  In the ED he was afebrile and vital signs were stable.  Labs were unremarkable and at baseline.  Because he takes Xarelto  and was unclear as to whether he had his had a CT of the head was obtained to rule out traumatic bleeding.  This was unremarkable in regards to acute bleeding but there was a finding of left parietal cortical encephalomalacia related to remote posterior left MCA territory infarct.  Because he was complaining of right pain a CT was obtained and this did reveal a nondisplaced right trochanteric periprosthetic fracture noting he has had a prior right hemiarthroplasty.  Hospitalist service has been asked to evaluate the patient for admission.   Assessment & Plan:   Principal Problem:   Periprosthetic intertrochanteric fracture of femur Active Problems:   Atrial fibrillation (HCC)   Essential hypertension   Chronic diastolic CHF (congestive heart failure) (HCC)   BPH (benign prostatic hyperplasia)   Alcohol abuse   Cerebrovascular accident (CVA) (HCC)   Seizures (HCC)  Recurrent falls Acute periprosthetic right trochanteric nondisplaced fracture Has been evaluated  by orthopedic team and recommendation is for nonsurgical treatment and has been a while WBAT Percocet for pain Early mobilization with PT/OT evaluation, advising snf Lives with sister and has been noted to have frequent falls and has been to the ER 6 times this year with either falls and or breakthrough seizures.   TOC consulted for snf placement  Seizure disorder With seizure 8/14, rapid response.  Continue preadmission Vimpat  and Keppra  IM midazolam  as lost IV access, seizure resolved with this Dr matthews advises no change in antiepileptics, seizure likely provoked by etoh withdrawal  Cough New today though none at time of exam. Denies dyspnea, no hypoxia - check respiratory panel - check cxr, is at risk for aspiration, will change to dysphagia 3 diet  Alcohol abuse Patient states he drinks two 16 ounce beers daily but is a poor historian Continue CIWA protocol and utilize oral Ativan  Mild withdrawal symptoms today   History of CVA Continue statin and Xarelto    Atrial fibrillation Obtain EKG and continue telemetry monitoring-pulse less than 100 bpm Continue home Xarelto  Continue carvedilol    History of dilated cardiomyopathy now with HFrecEF/hypertension Continue Norvasc , and carvedilol  Last echo was in 2024 and revealed preserved EF with positive diastolic dysfunction parameters and mild LVH   Dyslipidemia Continue Lipitor   BPH Continue Flomax  Monitor for acute urinary retention especially with requirement for narcotics for trochanteric fracture  Constipation No bm since arrival - start miralax /senna    DVT prophylaxis: apixaban  Code Status: full Family Communication: sister updated at bedside 8/15  Level  of care: Telemetry Medical Status is: Inpatient Remains inpatient appropriate because: pending snf placement    Consultants:  none  Procedures: none  Antimicrobials:  none    Subjective: Reports cough, no dyspnea  Objective: Vitals:   10/07/23  2143 10/08/23 0154 10/08/23 0407 10/08/23 0757  BP: 134/64 135/67 133/84 122/61  Pulse: 66 64 66 (!) 55  Resp: 17 17 20 17   Temp:  97.9 F (36.6 C) 98.4 F (36.9 C) (!) 97.4 F (36.3 C)  TempSrc:  Oral    SpO2: 99% 100% 100% 98%  Weight:      Height:        Intake/Output Summary (Last 24 hours) at 10/08/2023 1344 Last data filed at 10/08/2023 0235 Gross per 24 hour  Intake 360 ml  Output 800 ml  Net -440 ml   Filed Weights   10/05/23 1921  Weight: 76.2 kg    Examination:  General exam: NAD Respiratory system: Clear to auscultation. Respiratory effort normal. Cardiovascular system: S1 & S2 heard, RRR.   Gastrointestinal system: Abdomen is nondistended, soft and nontender.   Central nervous system: rest tremor, moving all 4, alert Extremities: warm, no edema Skin: No rashes, lesions or ulcers Psychiatry: calm    Data Reviewed: I have personally reviewed following labs and imaging studies  CBC: Recent Labs  Lab 10/06/23 0220  WBC 8.1  NEUTROABS 5.5  HGB 10.4*  HCT 32.3*  MCV 84.1  PLT 233   Basic Metabolic Panel: Recent Labs  Lab 10/06/23 0220 10/08/23 0552  NA 137 140  K 4.2 3.8  CL 105 110  CO2 20* 24  GLUCOSE 104* 97  BUN 17 16  CREATININE 1.08 1.10  CALCIUM  9.3 8.9   GFR: Estimated Creatinine Clearance: 65.6 mL/min (by C-G formula based on SCr of 1.1 mg/dL). Liver Function Tests: No results for input(s): AST, ALT, ALKPHOS, BILITOT, PROT, ALBUMIN in the last 168 hours. No results for input(s): LIPASE, AMYLASE in the last 168 hours. No results for input(s): AMMONIA in the last 168 hours. Coagulation Profile: No results for input(s): INR, PROTIME in the last 168 hours. Cardiac Enzymes: No results for input(s): CKTOTAL, CKMB, CKMBINDEX, TROPONINI in the last 168 hours. BNP (last 3 results) No results for input(s): PROBNP in the last 8760 hours. HbA1C: No results for input(s): HGBA1C in the last 72  hours. CBG: No results for input(s): GLUCAP in the last 168 hours. Lipid Profile: No results for input(s): CHOL, HDL, LDLCALC, TRIG, CHOLHDL, LDLDIRECT in the last 72 hours. Thyroid Function Tests: No results for input(s): TSH, T4TOTAL, FREET4, T3FREE, THYROIDAB in the last 72 hours. Anemia Panel: Recent Labs    10/06/23 1648  VITAMINB12 482   Urine analysis:    Component Value Date/Time   COLORURINE STRAW (A) 08/26/2023 0857   APPEARANCEUR CLEAR (A) 08/26/2023 0857   LABSPEC 1.025 08/26/2023 0857   PHURINE 6.0 08/26/2023 0857   GLUCOSEU NEGATIVE 08/26/2023 0857   HGBUR NEGATIVE 08/26/2023 0857   BILIRUBINUR NEGATIVE 08/26/2023 0857   KETONESUR NEGATIVE 08/26/2023 0857   PROTEINUR NEGATIVE 08/26/2023 0857   NITRITE NEGATIVE 08/26/2023 0857   LEUKOCYTESUR NEGATIVE 08/26/2023 0857   Sepsis Labs: @LABRCNTIP (procalcitonin:4,lacticidven:4)  )No results found for this or any previous visit (from the past 240 hours).       Radiology Studies: No results found.       Scheduled Meds:  amLODipine   10 mg Oral QHS   aspirin  EC  81 mg Oral Daily   atorvastatin   40 mg  Oral QHS   carvedilol   12.5 mg Oral BID   cyanocobalamin   1,000 mcg Oral q AM   folic acid   1 mg Oral q AM   gabapentin   100 mg Oral BID   lacosamide   150 mg Oral BID   levETIRAcetam   1,500 mg Oral BID   LORazepam   4 mg Intravenous STAT   multivitamin with minerals  1 tablet Oral Daily   multivitamins with iron   1 tablet Oral QHS   rivaroxaban   20 mg Oral Q supper   sodium chloride  flush  3 mL Intravenous Q12H   tamsulosin   0.4 mg Oral QHS   thiamine   100 mg Oral Daily   Or   thiamine   100 mg Intravenous Daily   Continuous Infusions:   LOS: 2 days     Devaughn KATHEE Ban, MD [facility]   If 7PM-7AM, please contact night-coverage [email or web address] Password [email or web address] 10/08/2023, 1:44 PM

## 2023-10-08 NOTE — Progress Notes (Signed)
 Physical Therapy Treatment Patient Details Name: John Spears MRN: [id number] DOB: 1950-07-18 Today's Date: 10/08/2023   History of Present Illness 73 y.o. male s/p recent fall and subsequent right hip pain, but was still able to ambulate.  He then had another fall and had difficulty with ambulation afterwards.  He has had prior right hip replacement over 10 years ago, and he cannot recall the surgeon.  Imaging in the emergency department showed a minimally displaced greater trochanteric periprosthetic fracture - no plans for surgerical intervention.  PMH:  Seizures.    PT Comments  PT/OT co-treatment performed.  Pt resting in bed upon therapy arrival; pt agreeable to therapy.  In bed pt appearing with increased stiffness in general with tremors (pt noted with B hands in closed fists position); symptoms increased a little when sitting on EOB; difficulty following 1 step cues in general noted.  Initial R hip pain 0/10 at rest beginning of session but increased to 10/10 when sitting on EOB (prior to session, discussed with pt's nurse pre-medicating pt with pain medication for therapy session but ultimately deferred per discussion with pt's nurse d/t nurse reporting pain medication made pt very drowsy earlier today and concerns regarding pt being alert for therapy participation).  Mod assist x2 with bed mobility and sitting balance min assist progressing to CGA.  Pt c/o dizziness sitting EOB (BP 157/100 with HR 87 bpm) and pt perseverating (pt declining further activity) so pt assisted back to bed.  Nurse arrived and updated on pt's status/pain and nurse gave pt medication to address pt's needs.  Pt appearing more relaxed in bed end of session with decreased tremors (hands open/relaxed).  Will continue to focus on strengthening and progressive functional mobility during hospitalization.    If plan is discharge home, recommend the following: A lot of help with walking and/or transfers;A little help  with bathing/dressing/bathroom;Assist for transportation;Help with stairs or ramp for entrance   Can travel by private vehicle     No  Equipment Recommendations  Rolling walker (2 wheels);BSC/3in1;Wheelchair (measurements PT);Wheelchair cushion (measurements PT)    Recommendations for Other Services       Precautions / Restrictions Precautions Precautions: Fall Recall of Precautions/Restrictions: Impaired Precaution/Restrictions Comments: Seizures; CIWA Restrictions Weight Bearing Restrictions Per Provider Order: Yes RLE Weight Bearing Per Provider Order: Weight bearing as tolerated Other Position/Activity Restrictions: Per ortho consult R LE is WBAT for non surgical fx. No active hip abduction x 6 wks.     Mobility  Bed Mobility Overal bed mobility: Needs Assistance Bed Mobility: Supine to Sit, Sit to Supine     Supine to sit: Mod assist, +2 for physical assistance Sit to supine: Mod assist, +2 for physical assistance   General bed mobility comments: assist for trunk and B LE's; vc's for technique and precautions    Transfers                   General transfer comment: Pt declined d/t pain and dizziness (RN notified)    Ambulation/Gait                   Stairs             Wheelchair Mobility     Tilt Bed    Modified Rankin (Stroke Patients Only)       Balance Overall balance assessment: Needs assistance Sitting-balance support: Bilateral upper extremity supported, Feet supported Sitting balance-Leahy Scale: Fair Sitting balance - Comments: Initial min assist progressing to CGA  Communication Communication Communication: Impaired Factors Affecting Communication: Difficulty expressing self;Reduced clarity of speech  Cognition Arousal: Alert Behavior During Therapy: Flat affect   PT - Cognitive impairments: Awareness, Attention, Initiation, Sequencing, Problem solving,  Safety/Judgement                       PT - Cognition Comments: A&Ox4 Following commands: Impaired Following commands impaired: Follows one step commands inconsistently    Cueing Cueing Techniques: Verbal cues, Visual cues, Gestural cues, Tactile cues  Exercises      General Comments  Nursing cleared pt for participation in physical therapy.  Pt agreeable to PT session.      Pertinent Vitals/Pain Pain Assessment Pain Assessment: 0-10 Pain Score: 10-Worst pain ever Pain Location: R LE with sup>sit EOB Pain Descriptors / Indicators: Aching, Discomfort, Grimacing, Guarding, Sharp Pain Intervention(s): Limited activity within patient's tolerance, Monitored during session, Repositioned, Patient requesting pain meds-RN notified, RN gave pain meds during session    Home Living                          Prior Function            PT Goals (current goals can now be found in the care plan section) Acute Rehab PT Goals Patient Stated Goal: get walking better PT Goal Formulation: With patient Time For Goal Achievement: 10/19/23 Potential to Achieve Goals: Fair Progress towards PT goals: Not progressing toward goals - comment (limited d/t pain and dizziness)    Frequency    Min 2X/week      PT Plan      Co-evaluation PT/OT/SLP Co-Evaluation/Treatment: Yes Reason for Co-Treatment: For patient/therapist safety;To address functional/ADL transfers PT goals addressed during session: Mobility/safety with mobility;Balance;Proper use of DME OT goals addressed during session: ADL's and self-care      AM-PAC PT 6 Clicks Mobility   Outcome Measure  Help needed turning from your back to your side while in a flat bed without using bedrails?: A Little Help needed moving from lying on your back to sitting on the side of a flat bed without using bedrails?: Total Help needed moving to and from a bed to a chair (including a wheelchair)?: Total Help needed standing up  from a chair using your arms (e.g., wheelchair or bedside chair)?: Total Help needed to walk in hospital room?: Total Help needed climbing 3-5 steps with a railing? : Total 6 Click Score: 8    End of Session   Activity Tolerance: Patient limited by pain Patient left: in bed;with call bell/phone within reach;with bed alarm set;Other (comment) (B heels floating via pillow support) Nurse Communication: Mobility status;Patient requests pain meds;Other (comment) (Pt's symptoms/status) PT Visit Diagnosis: Unsteadiness on feet (R26.81);Muscle weakness (generalized) (M62.81);Difficulty in walking, not elsewhere classified (R26.2)     Time: 1430-1456 PT Time Calculation (min) (ACUTE ONLY): 26 min  Charges:    $Therapeutic Activity: 8-22 mins PT General Charges $$ ACUTE PT VISIT: 1 Visit                     Damien Caulk, PT 10/08/23, 5:33 PM

## 2023-10-08 NOTE — TOC Progression Note (Addendum)
 Transition of Care [facility]) - Progression Note    Patient Details  Name: John Spears MRN: [id number] Date of Birth: 1951-01-15  [facility] CM/SW Contact  Alvaro Louder, [place] Phone Number: 10/08/2023, 9:22 AM  Clinical Narrative:   4:00 [facility] attempted to deliver SNF list to patient.    Chart Reviewed. [facility] Faxed out information to SNF's in [place]. [facility] will present Facilities to patient at the bedside.  TOC to follow for discharge                    Expected Discharge Plan and Services                                               Social Drivers of Health (SDOH) Interventions SDOH Screenings   Food Insecurity: No Food Insecurity (10/06/2023)  Housing: Low Risk  (10/06/2023)  Transportation Needs: No Transportation Needs (10/06/2023)  Utilities: Not At Risk (10/06/2023)  Financial Resource Strain: Medium Risk (04/06/2023)   Received from [facility]  Social Connections: Moderately Isolated (10/06/2023)  Tobacco Use: Medium Risk (10/05/2023)    Readmission Risk Interventions     No data to display

## 2023-10-08 NOTE — Care Management Important Message (Signed)
 Important Message  Patient Details  Name: John Spears MRN: [id number] Date of Birth: 1950-08-05   Important Message Given:  Yes - Medicare IM     Larae Caison W, CMA 10/08/2023, 1:03 PM

## 2023-10-08 NOTE — Progress Notes (Signed)
 Occupational Therapy Treatment Patient Details Name: John Spears MRN: [id number] DOB: 1950/04/21 Today's Date: 10/08/2023   History of present illness 73 y.o. male s/p recent fall and subsequent right hip pain, but was still able to ambulate.  He then had another fall and had difficulty with ambulation afterwards.  He has had prior right hip replacement over 10 years ago, and he cannot recall the surgeon.  Imaging in the emergency department showed a minimally displaced greater trochanteric periprosthetic fracture - no plans for surgerical intervention.  PMH:  Seizures.   OT comments  Pt seen for OT co-tx with PT to optimize safety. Pt alert, flat affect, agreeable, and denies pain at rest. Pt noted with difficulty with speech clarify and opening his mouth well enough to articulate. Difficulty noted during session with following 1 step commands, difficulty differentiating right versus left occasionally requiring additional multimodal cues while engaging in reaching. Pt required MIN A for washing his face, better able to complete with supported long sitting in bed with less notable tremors. MOD A +2 for bed mobility with significant increase in R hip pain, pt endorsing dizziness, and mild increase in tremors and BUE stiffness and difficulty opening his hands. Improved some with return to bed. VSS while seated EOB, RN notified. Pt noted with change in diet order (regular removed and now dysphagia 3). Old lunch tray brought before the change removed. Dining services called to bring pt new D3 tray for lunch and dinner. RN notified. Pt continues to benefits from skilled OT services.       If plan is discharge home, recommend the following:  Two people to help with walking and/or transfers;A lot of help with bathing/dressing/bathroom;Assistance with cooking/housework;Help with stairs or ramp for entrance;Assist for transportation   Equipment Recommendations  Other (comment) (defer)     Recommendations for Other Services      Precautions / Restrictions Precautions Precautions: Fall Recall of Precautions/Restrictions: Impaired Precaution/Restrictions Comments: seizures, CIWA Restrictions Weight Bearing Restrictions Per Provider Order: Yes RLE Weight Bearing Per Provider Order: Weight bearing as tolerated Other Position/Activity Restrictions: Per ortho consult R LE is WBAT for non surgical fx. No active hip abduction x 6 wks.       Mobility Bed Mobility Overal bed mobility: Needs Assistance Bed Mobility: Supine to Sit, Sit to Supine     Supine to sit: Mod assist, +2 for physical assistance Sit to supine: Mod assist, +2 for physical assistance        Transfers                   General transfer comment: pt declined, pain limited, perseverating on dizziness and pain (vitals stable, RN notified)     Balance Overall balance assessment: Needs assistance Sitting-balance support: Bilateral upper extremity supported, Feet supported Sitting balance-Leahy Scale: Fair Sitting balance - Comments: initial MIN A improving to CGA                                   ADL either performed or assessed with clinical judgement   ADL Overall ADL's : Needs assistance/impaired Eating/Feeding: Sitting;Supervision/ safety;Set up   Grooming: Bed level;Wash/dry face;Minimal assistance Grooming Details (indicate cue type and reason): required cues for initiating, sequencing, and MIN A for thoroughness overall  Extremity/Trunk Assessment              Occupational psychologist Communication: Impaired Factors Affecting Communication: Difficulty expressing self;Reduced clarity of speech   Cognition Arousal: Alert Behavior During Therapy: Flat affect               OT - Cognition Comments: Pt perseverating on reporting his knee cap is out of place  and asking if various things would help it get back in place despite redirection and cues                 Following commands: Impaired Following commands impaired: Follows one step commands inconsistently      Cueing   Cueing Techniques: Verbal cues, Visual cues, Gestural cues, Tactile cues  Exercises      Shoulder Instructions       General Comments      Pertinent Vitals/ Pain       Pain Assessment Pain Assessment: 0-10 Pain Score: 10-Worst pain ever Pain Location: R LE with sup>sit EOB Pain Descriptors / Indicators: Aching, Discomfort, Grimacing, Guarding, Sharp Pain Intervention(s): Limited activity within patient's tolerance, Monitored during session, Repositioned, Patient requesting pain meds-RN notified, RN gave pain meds during session  Home Living                                          Prior Functioning/Environment              Frequency  Min 2X/week        Progress Toward Goals  OT Goals(current goals can now be found in the care plan section)  Progress towards OT goals: Progressing toward goals  Acute Rehab OT Goals Patient Stated Goal: to decrease pain OT Goal Formulation: With patient Time For Goal Achievement: 10/20/23 Potential to Achieve Goals: Fair  Plan      Co-evaluation    PT/OT/SLP Co-Evaluation/Treatment: Yes Reason for Co-Treatment: For patient/therapist safety;To address functional/ADL transfers PT goals addressed during session: Mobility/safety with mobility;Balance;Proper use of DME OT goals addressed during session: ADL's and self-care      AM-PAC OT 6 Clicks Daily Activity     Outcome Measure   Help from another person eating meals?: A Little Help from another person taking care of personal grooming?: A Lot Help from another person toileting, which includes using toliet, bedpan, or urinal?: Total Help from another person bathing (including washing, rinsing, drying)?: A Lot Help from another  person to put on and taking off regular upper body clothing?: A Lot Help from another person to put on and taking off regular lower body clothing?: Total 6 Click Score: 11    End of Session    OT Visit Diagnosis: Unsteadiness on feet (R26.81);Repeated falls (R29.6);Muscle weakness (generalized) (M62.81)   Activity Tolerance Patient limited by pain   Patient Left in bed;with call bell/phone within reach;with bed alarm set   Nurse Communication Mobility status;Other (comment) (removed regular food tray, called dining to get him a dysphagia 3 tray for lunch and for dinner)        Time: 1430-1456 OT Time Calculation (min): 26 min  Charges: OT General Charges $OT Visit: 1 Visit OT Treatments $Self Care/Home Management : 8-22 mins  Warren SAUNDERS., MPH, MS, OTR/L ascom [phone] 10/08/23, 4:45 PM

## 2023-10-08 NOTE — [place] FL2 (Signed)
 [place]  MEDICAID FL2 LEVEL OF CARE FORM     IDENTIFICATION  Patient Name: John Spears Birthdate: April 23, 1950 Sex: male Admission Date (Current Location): 10/06/2023  [facility] and [place] Number:  Chiropodist and Address:  [facility], [address] [place], [place] [address]      Provider Number: [id number]  Attending Physician Name and Address:  Kandis Devaughn Sayres, MD  Relative Name and Phone Number:       Current Level of Care: Hospital Recommended Level of Care: Skilled Nursing Facility Prior Approval Number:    Date Approved/Denied:   PASRR Number: [id number]  Discharge Plan: Home    Current Diagnoses: Patient Active Problem List   Diagnosis Date Noted   Periprosthetic intertrochanteric fracture of femur 10/06/2023   HLD (hyperlipidemia) 07/23/2023   BPH (benign prostatic hyperplasia) 07/23/2023   Chronic diastolic CHF (congestive heart failure) (HCC) 03/09/2023   Atrial fibrillation (HCC) 03/09/2023   Seizure (HCC) 03/09/2023   Toxic metabolic encephalopathy 03/09/2023   Acute metabolic encephalopathy 01/26/2023   Malnutrition of moderate degree 12/24/2022   ETOH abuse 12/22/2022   Thrombocytopenia (HCC) 12/22/2022   Breakthrough seizure (HCC) 12/22/2022   CKD (chronic kidney disease), stage III (HCC) 09/25/2022   Chest congestion 04/04/2022   Chronic cough 04/04/2022   Dilated cardiomyopathy (HCC) 02/26/2022   Alcohol abuse 02/26/2022   Seizures (HCC) 02/20/2022   Transaminitis    Traumatic rhabdomyolysis (HCC)    Thrombocytopenia (HCC)    History of CVA (cerebrovascular accident) 10/22/2016   Essential hypertension 10/22/2016   Alcoholism (HCC) 10/22/2016   Cerebrovascular accident (CVA) (HCC) 04/05/2016    Orientation RESPIRATION BLADDER Height & Weight     Self, Place, Time  Normal Incontinent Weight: 168 lb (76.2 kg) Height:  5' 11 (180.3 cm)  BEHAVIORAL SYMPTOMS/MOOD NEUROLOGICAL BOWEL NUTRITION  STATUS      Continent Diet (Regular)  AMBULATORY STATUS COMMUNICATION OF NEEDS Skin   Extensive Assist Verbally Normal                       Personal Care Assistance Level of Assistance  Bathing, Dressing, Feeding Bathing Assistance: Limited assistance Feeding assistance: Independent Dressing Assistance: Limited assistance     Functional Limitations Info             SPECIAL CARE FACTORS FREQUENCY  PT (By licensed PT), OT (By licensed OT)     PT Frequency: 5x/week OT Frequency: 5x/week            Contractures      Additional Factors Info  Code Status, Allergies Code Status Info: Full Allergies Info: Not on file           Current Medications (10/08/2023):  This is the current hospital active medication list Current Facility-Administered Medications  Medication Dose Route Frequency Provider Last Rate Last Admin   acetaminophen  (TYLENOL ) tablet 650 mg  650 mg Oral Q6H PRN Alto Isaiah CROME, NP       Or   acetaminophen  (TYLENOL ) suppository 650 mg  650 mg Rectal Q6H PRN Alto Isaiah CROME, NP       amLODipine  (NORVASC ) tablet 10 mg  10 mg Oral QHS Alto Isaiah CROME, NP   10 mg at 10/07/23 2154   aspirin  EC tablet 81 mg  81 mg Oral Daily Alto Isaiah CROME, NP   81 mg at 10/08/23 0902   atorvastatin  (LIPITOR) tablet 40 mg  40 mg Oral QHS Alto Isaiah CROME, NP   40  mg at 10/07/23 2153   carvedilol  (COREG ) tablet 12.5 mg  12.5 mg Oral BID Alto Isaiah CROME, NP   12.5 mg at 10/08/23 0901   cyanocobalamin  (VITAMIN B12) tablet 1,000 mcg  1,000 mcg Oral q AM Alto Isaiah CROME, NP   1,000 mcg at 10/08/23 9097   folic acid  (FOLVITE ) tablet 1 mg  1 mg Oral q AM Alto Isaiah CROME, NP   1 mg at 10/08/23 9097   gabapentin  (NEURONTIN ) capsule 100 mg  100 mg Oral BID Alto Isaiah CROME, NP   100 mg at 10/08/23 0901   lacosamide  (VIMPAT ) tablet 150 mg  150 mg Oral BID Kandis Devaughn Sayres, MD   150 mg at 10/08/23 0901   levETIRAcetam  (KEPPRA ) tablet 1,500 mg  1,500 mg Oral BID Alto Isaiah CROME, NP   1,500 mg at 10/08/23 0901   LORazepam  (ATIVAN ) injection 4 mg  4 mg Intravenous STAT Wouk, Devaughn Sayres, MD       LORazepam  (ATIVAN ) tablet 1-4 mg  1-4 mg Oral Q1H PRN Cyrena Mylar, MD   1 mg at 10/08/23 9097   morphine  (PF) 2 MG/ML injection 2 mg  2 mg Intravenous Q4H PRN Wouk, Devaughn Sayres, MD       multivitamin with minerals tablet 1 tablet  1 tablet Oral Daily Cyrena Mylar, MD   1 tablet at 10/08/23 0901   multivitamins with iron  tablet 1 tablet  1 tablet Oral QHS Alto Isaiah CROME, NP   1 tablet at 10/06/23 2313   ondansetron  (ZOFRAN ) tablet 4 mg  4 mg Oral Q6H PRN Alto Isaiah CROME, NP       Or   ondansetron  (ZOFRAN ) injection 4 mg  4 mg Intravenous Q6H PRN Alto Isaiah CROME, NP       oxyCODONE -acetaminophen  (PERCOCET/ROXICET) 5-325 MG per tablet 1-2 tablet  1-2 tablet Oral Q4H PRN Alto Isaiah CROME, NP   1 tablet at 10/08/23 0902   rivaroxaban  (XARELTO ) tablet 20 mg  20 mg Oral Q supper Alto Isaiah CROME, NP   20 mg at 10/07/23 1733   sodium chloride  flush (NS) 0.9 % injection 3 mL  3 mL Intravenous Q12H Alto Isaiah CROME, NP   3 mL at 10/08/23 0902   tamsulosin  (FLOMAX ) capsule 0.4 mg  0.4 mg Oral QHS Alto Isaiah CROME, NP   0.4 mg at 10/07/23 2153   thiamine  (VITAMIN B1) tablet 100 mg  100 mg Oral Daily Cyrena Mylar, MD   100 mg at 10/08/23 0901   Or   thiamine  (VITAMIN B1) injection 100 mg  100 mg Intravenous Daily Cyrena Mylar, MD         Discharge Medications: Please see discharge summary for a list of discharge medications.  Relevant Imaging Results:  Relevant Lab Results:   Additional Information SSN: [id number]  Alvaro Louder, LCSW

## 2023-10-09 DIAGNOSIS — M978XXA Periprosthetic fracture around other internal prosthetic joint, initial encounter: Secondary | ICD-10-CM | POA: Diagnosis not present

## 2023-10-09 DIAGNOSIS — Z96649 Presence of unspecified artificial hip joint: Secondary | ICD-10-CM | POA: Diagnosis not present

## 2023-10-09 LAB — CBC
HCT: 31.1 % — ABNORMAL LOW (ref 39.0–52.0)
Hemoglobin: 9.7 g/dL — ABNORMAL LOW (ref 13.0–17.0)
MCH: 26.7 pg (ref 26.0–34.0)
MCHC: 31.2 g/dL (ref 30.0–36.0)
MCV: 85.7 fL (ref 80.0–100.0)
Platelets: 208 K/uL (ref 150–400)
RBC: 3.63 MIL/uL — ABNORMAL LOW (ref 4.22–5.81)
RDW: 15.6 % — ABNORMAL HIGH (ref 11.5–15.5)
WBC: 6.2 K/uL (ref 4.0–10.5)
nRBC: 0 % (ref 0.0–0.2)

## 2023-10-09 LAB — BASIC METABOLIC PANEL WITH GFR
Anion gap: 10 (ref 5–15)
BUN: 17 mg/dL (ref 8–23)
CO2: 23 mmol/L (ref 22–32)
Calcium: 8.9 mg/dL (ref 8.9–10.3)
Chloride: 107 mmol/L (ref 98–111)
Creatinine, Ser: 0.99 mg/dL (ref 0.61–1.24)
GFR, Estimated: >60 mL/min (ref >60)
Glucose, Bld: 84 mg/dL (ref 70–99)
Potassium: 3.7 mmol/L (ref 3.5–5.1)
Sodium: 140 mmol/L (ref 135–145)

## 2023-10-09 MED ORDER — LACTULOSE 10 GM/15ML PO SOLN
30.0000 g | Freq: Once | ORAL | Status: AC
  Administered 2023-10-09: 30 g via ORAL
  Filled 2023-10-09: qty 60

## 2023-10-09 MED ORDER — LORAZEPAM 1 MG PO TABS
1.0000 mg | ORAL_TABLET | ORAL | Status: AC | PRN
  Administered 2023-10-09: 1 mg via ORAL
  Filled 2023-10-09: qty 1

## 2023-10-09 MED ORDER — FUROSEMIDE 20 MG PO TABS
20.0000 mg | ORAL_TABLET | Freq: Every day | ORAL | Status: DC
  Administered 2023-10-09 – 2023-10-13 (×5): 20 mg via ORAL
  Filled 2023-10-09 (×5): qty 1

## 2023-10-09 NOTE — Progress Notes (Signed)
 PROGRESS NOTE    John Spears  FMW:[id number] DOB: [birth year] DOA: 10/06/2023 PCP: Patient, No Pcp Per     Brief Narrative:   From admission h and p  John Spears is a 72 y.o. male with medical history significant of history of CVA, seizure disorder on AEDs, dilated cardiomyopathy with recovered EF, chronic kidney disease stage IIIa, dyslipidemia, atrial fibrillation on Xarelto , and BPH.  Patient was brought to the ED after experiencing a fall.  He reported to triage that he was walking to the mailbox on Monday and lost his balance.  Was complaining of pain in his right hip and showed the nurse abrasions to the back of his hand.  He apparently fell again on Tuesday but history was inconsistent and he could not confirm whether he did his head or how the fall occurred.  He admitted to drinking alcohol.  In the ED he was afebrile and vital signs were stable.  Labs were unremarkable and at baseline.  Because he takes Xarelto  and was unclear as to whether he had his had a CT of the head was obtained to rule out traumatic bleeding.  This was unremarkable in regards to acute bleeding but there was a finding of left parietal cortical encephalomalacia related to remote posterior left MCA territory infarct.  Because he was complaining of right pain a CT was obtained and this did reveal a nondisplaced right trochanteric periprosthetic fracture noting he has had a prior right hemiarthroplasty.  Hospitalist service has been asked to evaluate the patient for admission.   Assessment & Plan:   Principal Problem:   Periprosthetic intertrochanteric fracture of femur Active Problems:   Atrial fibrillation (HCC)   Essential hypertension   Chronic diastolic CHF (congestive heart failure) (HCC)   BPH (benign prostatic hyperplasia)   Alcohol abuse   Cerebrovascular accident (CVA) (HCC)   Seizures (HCC)  Recurrent falls Acute periprosthetic right trochanteric nondisplaced fracture Has been evaluated  by orthopedic team and recommendation is for nonsurgical treatment and has been a while WBAT Percocet for pain Early mobilization with PT/OT evaluation, advising snf Lives with sister and has been noted to have frequent falls and has been to the ER 6 times this year with either falls and or breakthrough seizures.   TOC consulted for snf placement  Seizure disorder With seizure 8/14, rapid response.  Continue preadmission Vimpat  and Keppra  IM midazolam  as lost IV access, seizure resolved with this Dr matthews advises no change in antiepileptics, seizure likely provoked by etoh withdrawal  Cough New yesterday, none since, cxr shows possible lll infiltrate but procal neg, no leukocytosis, no fever, no ongoing cough, covid/flu/rsv neg,   - monitor  Alcohol abuse Patient states he drinks two 16 ounce beers daily but is a poor historian Continue CIWA protocol and utilize oral Ativan  Mild to no withdrawal symptoms today   History of CVA Continue statin and Xarelto    Atrial fibrillation Obtain EKG and continue telemetry monitoring-pulse less than 100 bpm Continue home Xarelto  Continue carvedilol    History of dilated cardiomyopathy now with HFrecEF/hypertension Continue Norvasc , and carvedilol  Last echo was in 2024 and revealed preserved EF with positive diastolic dysfunction parameters and mild LVH Resume home lasix  today  Hypertension Bp elevated today Continuing home amlodipine  Add back home lasix    Dyslipidemia Continue Lipitor   BPH Continue Flomax  Monitor for acute urinary retention especially with requirement for narcotics for trochanteric fracture  Constipation No bm since arrival - cont miralax /senna - add dose lactulose  today  DVT prophylaxis: apixaban  Code Status: full Family Communication: sister updated at bedside 8/15  Level of care: Telemetry Medical Status is: Inpatient Remains inpatient appropriate because: pending snf placement    Consultants:   none  Procedures: none  Antimicrobials:  none    Subjective: Denies cough or sob. Tolerating diet  Objective: Vitals:   10/08/23 0757 10/08/23 1522 10/09/23 0349 10/09/23 0747  BP: 122/61 133/81 (!) 145/71 (!) 177/89  Pulse: (!) 55 (!) 57 (!) 57 75  Resp: 17 19 14 18   Temp: (!) 97.4 F (36.3 C) 98.6 F (37 C) (!) 97.4 F (36.3 C) 97.6 F (36.4 C)  TempSrc:   Oral   SpO2: 98% 100% 100% 100%  Weight:      Height:        Intake/Output Summary (Last 24 hours) at 10/09/2023 1421 Last data filed at 10/09/2023 1300 Gross per 24 hour  Intake 600 ml  Output 900 ml  Net -300 ml   Filed Weights   10/05/23 1921  Weight: 76.2 kg    Examination:  General exam: NAD Respiratory system: Clear to auscultation. Respiratory effort normal. Cardiovascular system: S1 & S2 heard, RRR.   Gastrointestinal system: Abdomen is nondistended, soft and nontender.   Central nervous system: rest tremor, moving all 4, alert Extremities: warm, no edema Skin: No rashes, lesions or ulcers Psychiatry: calm    Data Reviewed: I have personally reviewed following labs and imaging studies  CBC: Recent Labs  Lab 10/06/23 0220 10/09/23 0428  WBC 8.1 6.2  NEUTROABS 5.5  --   HGB 10.4* 9.7*  HCT 32.3* 31.1*  MCV 84.1 85.7  PLT 233 208   Basic Metabolic Panel: Recent Labs  Lab 10/06/23 0220 10/08/23 0552 10/09/23 0428  NA 137 140 140  K 4.2 3.8 3.7  CL 105 110 107  CO2 20* 24 23  GLUCOSE 104* 97 84  BUN 17 16 17   CREATININE 1.08 1.10 0.99  CALCIUM  9.3 8.9 8.9   GFR: Estimated Creatinine Clearance: 72.9 mL/min (by C-G formula based on SCr of 0.99 mg/dL). Liver Function Tests: No results for input(s): AST, ALT, ALKPHOS, BILITOT, PROT, ALBUMIN in the last 168 hours. No results for input(s): LIPASE, AMYLASE in the last 168 hours. No results for input(s): AMMONIA in the last 168 hours. Coagulation Profile: No results for input(s): INR, PROTIME in the last  168 hours. Cardiac Enzymes: No results for input(s): CKTOTAL, CKMB, CKMBINDEX, TROPONINI in the last 168 hours. BNP (last 3 results) No results for input(s): PROBNP in the last 8760 hours. HbA1C: No results for input(s): HGBA1C in the last 72 hours. CBG: No results for input(s): GLUCAP in the last 168 hours. Lipid Profile: No results for input(s): CHOL, HDL, LDLCALC, TRIG, CHOLHDL, LDLDIRECT in the last 72 hours. Thyroid Function Tests: No results for input(s): TSH, T4TOTAL, FREET4, T3FREE, THYROIDAB in the last 72 hours. Anemia Panel: Recent Labs    10/06/23 1648  VITAMINB12 482   Urine analysis:    Component Value Date/Time   COLORURINE STRAW (A) 08/26/2023 0857   APPEARANCEUR CLEAR (A) 08/26/2023 0857   LABSPEC 1.025 08/26/2023 0857   PHURINE 6.0 08/26/2023 0857   GLUCOSEU NEGATIVE 08/26/2023 0857   HGBUR NEGATIVE 08/26/2023 0857   BILIRUBINUR NEGATIVE 08/26/2023 0857   KETONESUR NEGATIVE 08/26/2023 0857   PROTEINUR NEGATIVE 08/26/2023 0857   NITRITE NEGATIVE 08/26/2023 0857   LEUKOCYTESUR NEGATIVE 08/26/2023 0857   Sepsis Labs: @LABRCNTIP (procalcitonin:4,lacticidven:4)  ) Recent Results (from the past 240 hours)  Resp  panel by RT-PCR (RSV, Flu A&B, Covid) Anterior Nasal Swab     Status: None   Collection Time: 10/08/23  1:51 PM   Specimen: Anterior Nasal Swab  Result Value Ref Range Status   SARS Coronavirus 2 by RT PCR NEGATIVE NEGATIVE Final    Comment: (NOTE) SARS-CoV-2 target nucleic acids are NOT DETECTED.  The SARS-CoV-2 RNA is generally detectable in upper respiratory specimens during the acute phase of infection. The lowest concentration of SARS-CoV-2 viral copies this assay can detect is 138 copies/mL. A negative result does not preclude SARS-Cov-2 infection and should not be used as the sole basis for treatment or other patient management decisions. A negative result may occur with  improper specimen  collection/handling, submission of specimen other than nasopharyngeal swab, presence of viral mutation(s) within the areas targeted by this assay, and inadequate number of viral copies(<138 copies/mL). A negative result must be combined with clinical observations, patient history, and epidemiological information. The expected result is Negative.  Fact Sheet for Patients:  BloggerCourse.com  Fact Sheet for Healthcare Providers:  SeriousBroker.it  This test is no t yet approved or cleared by the [place]  FDA and  has been authorized for detection and/or diagnosis of SARS-CoV-2 by FDA under an Emergency Use Authorization (EUA). This EUA will remain  in effect (meaning this test can be used) for the duration of the COVID-19 declaration under Section 564(b)(1) of the Act, 21 U.S.C.section 360bbb-3(b)(1), unless the authorization is terminated  or revoked sooner.       Influenza A by PCR NEGATIVE NEGATIVE Final   Influenza B by PCR NEGATIVE NEGATIVE Final    Comment: (NOTE) The Xpert Xpress SARS-CoV-2/FLU/RSV plus assay is intended as an aid in the diagnosis of influenza from Nasopharyngeal swab specimens and should not be used as a sole basis for treatment. Nasal washings and aspirates are unacceptable for Xpert Xpress SARS-CoV-2/FLU/RSV testing.  Fact Sheet for Patients: BloggerCourse.com  Fact Sheet for Healthcare Providers: SeriousBroker.it  This test is not yet approved or cleared by the [place]  FDA and has been authorized for detection and/or diagnosis of SARS-CoV-2 by FDA under an Emergency Use Authorization (EUA). This EUA will remain in effect (meaning this test can be used) for the duration of the COVID-19 declaration under Section 564(b)(1) of the Act, 21 U.S.C. section 360bbb-3(b)(1), unless the authorization is terminated or revoked.     Resp Syncytial  Virus by PCR NEGATIVE NEGATIVE Final    Comment: (NOTE) Fact Sheet for Patients: BloggerCourse.com  Fact Sheet for Healthcare Providers: SeriousBroker.it  This test is not yet approved or cleared by the [place]  FDA and has been authorized for detection and/or diagnosis of SARS-CoV-2 by FDA under an Emergency Use Authorization (EUA). This EUA will remain in effect (meaning this test can be used) for the duration of the COVID-19 declaration under Section 564(b)(1) of the Act, 21 U.S.C. section 360bbb-3(b)(1), unless the authorization is terminated or revoked.  Performed at [facility], [address], [place], [place] [address]          Radiology Studies: DG Chest [place] 1 View Result Date: 10/08/2023 CLINICAL DATA:  Sudden onset cough EXAM: PORTABLE CHEST 1 VIEW COMPARISON:  None Available. FINDINGS: Normal cardiac silhouette. Subtle patchy airspace disease in the LEFT lower lobe no pleural fluid. No pneumothorax. IMPRESSION: Concern for LEFT lobe pneumonia. Electronically Signed   By: Jackquline Boxer M.D.   On: 10/08/2023 17:52         Scheduled Meds:  amLODipine   10 mg Oral QHS   aspirin  EC  81 mg Oral Daily   atorvastatin   40 mg Oral QHS   carvedilol   12.5 mg Oral BID   cyanocobalamin   1,000 mcg Oral q AM   folic acid   1 mg Oral q AM   furosemide   20 mg Oral Daily   gabapentin   100 mg Oral BID   lacosamide   150 mg Oral BID   levETIRAcetam   1,500 mg Oral BID   multivitamin with minerals  1 tablet Oral Daily   multivitamins with iron   1 tablet Oral QHS   polyethylene glycol  34 g Oral Daily   rivaroxaban   20 mg Oral Q supper   senna  1 tablet Oral Daily   sodium chloride  flush  3 mL Intravenous Q12H   tamsulosin   0.4 mg Oral QHS   thiamine   100 mg Oral Daily   Or   thiamine   100 mg Intravenous Daily   Continuous Infusions:   LOS: 3 days     Devaughn KATHEE Ban, MD [facility]   If  7PM-7AM, please contact night-coverage [email or web address] Password [email or web address] 10/09/2023, 2:21 PM

## 2023-10-09 NOTE — Progress Notes (Signed)
 Physical Therapy Treatment Patient Details Name: John Spears MRN: [id number] DOB: 05-11-1950 Today's Date: 10/09/2023   History of Present Illness 73 y.o. male s/p recent fall and subsequent right hip pain, but was still able to ambulate.  He then had another fall and had difficulty with ambulation afterwards.  He has had prior right hip replacement over 10 years ago, and he cannot recall the surgeon.  Imaging in the emergency department showed a minimally displaced greater trochanteric periprosthetic fracture - no plans for surgerical intervention.  PMH:  Seizures.    PT Comments  Pt awake on entry, but remains flat affect, delayed of response, and poor success with following cues- unclear if related to [facility] but I suspect multifactorial. Pt also impulsive with poor core control at times. Pt has pain in Rt trochanteric area that limits what he is able to do in session. Requires max to total A for all mobility. Pt makes 2 strong attempts at standing at counter in room, >90sec each, but is unable to come full upright, remains flexed in Left knee and trunk, RLE remains ABDCT with poor tolerance to weight bearing. Pt returned to bed at EOS, HOB >40 degrees, Rt heel floating. Will continue to follow.    If plan is discharge home, recommend the following: A lot of help with walking and/or transfers;A little help with bathing/dressing/bathroom;Assist for transportation;Help with stairs or ramp for entrance   Can travel by private vehicle     No  Equipment Recommendations  Rolling walker (2 wheels);BSC/3in1;Wheelchair (measurements PT);Wheelchair cushion (measurements PT)    Recommendations for Other Services       Precautions / Restrictions Precautions Precautions: Fall Recall of Precautions/Restrictions: Impaired Precaution/Restrictions Comments: Seizures; CIWA Restrictions Weight Bearing Restrictions Per Provider Order: Yes RLE Weight Bearing Per Provider Order: Weight bearing as  tolerated Other Position/Activity Restrictions: Per ortho consult R LE is WBAT for non surgical fx. No active hip abduction x 6 wks.     Mobility  Bed Mobility Overal bed mobility: Needs Assistance Bed Mobility: Supine to Sit, Sit to Supine     Supine to sit: Max assist, +2 for physical assistance Sit to supine: Max assist, +2 for physical assistance        Transfers Overall transfer level: Needs assistance Equipment used:  (sink) Transfers: Sit to/from Stand Sit to Stand: From elevated surface (Left tibial guarding, left trunk minA)           General transfer comment: unable to achieve full upright, not following commands for posture modification, difficulty loading RLE    Ambulation/Gait                   Stairs             Wheelchair Mobility     Tilt Bed    Modified Rankin (Stroke Patients Only)       Balance                                            Communication    Cognition Arousal: Lethargic Behavior During Therapy: Flat affect   PT - Cognitive impairments: Awareness, Attention, Initiation, Sequencing, Problem solving, Safety/Judgement                                Cueing    Exercises  General Comments        Pertinent Vitals/Pain Pain Assessment Pain Assessment: Faces Faces Pain Scale: Hurts whole lot Pain Intervention(s): Limited activity within patient's tolerance, Monitored during session, Premedicated before session    Home Living                          Prior Function            PT Goals (current goals can now be found in the care plan section) Acute Rehab PT Goals Patient Stated Goal: get walking better PT Goal Formulation: With patient Time For Goal Achievement: 10/19/23 Potential to Achieve Goals: Fair Progress towards PT goals: Not progressing toward goals - comment    Frequency    Min 2X/week      PT Plan      Co-evaluation               AM-PAC PT 6 Clicks Mobility   Outcome Measure  Help needed turning from your back to your side while in a flat bed without using bedrails?: Total Help needed moving from lying on your back to sitting on the side of a flat bed without using bedrails?: Total Help needed moving to and from a bed to a chair (including a wheelchair)?: Total Help needed standing up from a chair using your arms (e.g., wheelchair or bedside chair)?: Total Help needed to walk in hospital room?: Total Help needed climbing 3-5 steps with a railing? : Total 6 Click Score: 6    End of Session   Activity Tolerance: Patient limited by pain;Patient limited by fatigue;Patient limited by lethargy Patient left: in bed;with call bell/phone within reach;with bed alarm set;Other (comment) Nurse Communication: Mobility status;Patient requests pain meds;Other (comment) PT Visit Diagnosis: Unsteadiness on feet (R26.81);Muscle weakness (generalized) (M62.81);Difficulty in walking, not elsewhere classified (R26.2)     Time: 8976-8956 PT Time Calculation (min) (ACUTE ONLY): 20 min  Charges:    $Therapeutic Activity: 8-22 mins PT General Charges $$ ACUTE PT VISIT: 1 Visit                    11:09 AM, 10/09/23 John Spears, PT, DPT Physical Therapist - [facility]  [phone] (ASCOM)     John Spears 10/09/2023, 11:04 AM

## 2023-10-09 NOTE — Plan of Care (Signed)
   Problem: Health Behavior/Discharge Planning: Goal: Ability to manage health-related needs will improve Outcome: Progressing

## 2023-10-10 DIAGNOSIS — Z96649 Presence of unspecified artificial hip joint: Secondary | ICD-10-CM | POA: Diagnosis not present

## 2023-10-10 DIAGNOSIS — M978XXA Periprosthetic fracture around other internal prosthetic joint, initial encounter: Secondary | ICD-10-CM | POA: Diagnosis not present

## 2023-10-10 LAB — CBC
HCT: 31.9 % — ABNORMAL LOW (ref 39.0–52.0)
Hemoglobin: 10.3 g/dL — ABNORMAL LOW (ref 13.0–17.0)
MCH: 27 pg (ref 26.0–34.0)
MCHC: 32.3 g/dL (ref 30.0–36.0)
MCV: 83.7 fL (ref 80.0–100.0)
Platelets: 216 K/uL (ref 150–400)
RBC: 3.81 MIL/uL — ABNORMAL LOW (ref 4.22–5.81)
RDW: 15.5 % (ref 11.5–15.5)
WBC: 8 K/uL (ref 4.0–10.5)
nRBC: 0 % (ref 0.0–0.2)

## 2023-10-10 LAB — BASIC METABOLIC PANEL WITH GFR
Anion gap: 7 (ref 5–15)
BUN: 17 mg/dL (ref 8–23)
CO2: 25 mmol/L (ref 22–32)
Calcium: 9.5 mg/dL (ref 8.9–10.3)
Chloride: 108 mmol/L (ref 98–111)
Creatinine, Ser: 1.03 mg/dL (ref 0.61–1.24)
GFR, Estimated: >60 mL/min (ref >60)
Glucose, Bld: 80 mg/dL (ref 70–99)
Potassium: 3.9 mmol/L (ref 3.5–5.1)
Sodium: 140 mmol/L (ref 135–145)

## 2023-10-10 NOTE — Progress Notes (Signed)
 Physical Therapy Treatment Patient Details Name: John Spears MRN: [id number] DOB: 1950/10/24 Today's Date: 10/10/2023   History of Present Illness 73 y.o. male s/p recent fall and subsequent right hip pain, but was still able to ambulate.  He then had another fall and had difficulty with ambulation afterwards.  He has had prior right hip replacement over 10 years ago, and he cannot recall the surgeon.  Imaging in the emergency department showed a minimally displaced greater trochanteric periprosthetic fracture - no plans for surgerical intervention.  PMH:  Seizures.    PT Comments  Pt in bed awake but keeps eyes closed until end of session.  Pt frequently asking to be covered up despite blankets on given an additional - 5 layers total and blankets left on for ex's.  He resists BLE AAROM allowing only minimal ranges.  He is aware of ABD restrictions stating I'm not allowed to bring it out that way.  Pt educated that he can do ABD on left side but not his right.  He is encouraged to try to sit EOB/stand but he flatly refused despite education.  Upon leaving, pt stated he was ready to eat, opens his eyes and engages more.  Bed placed in upright position, meal set up complete and RN aware for supervision and assist as needed.     If plan is discharge home, recommend the following: Assist for transportation;Help with stairs or ramp for entrance;Two people to help with walking and/or transfers;A lot of help with bathing/dressing/bathroom   Can travel by private vehicle        Equipment Recommendations       Recommendations for Other Services       Precautions / Restrictions Precautions Precautions: Fall Recall of Precautions/Restrictions: Impaired Precaution/Restrictions Comments: Seizures; CIWA Restrictions Weight Bearing Restrictions Per Provider Order: Yes RLE Weight Bearing Per Provider Order: Weight bearing as tolerated Other Position/Activity Restrictions: Per ortho consult R LE  is WBAT for non surgical fx. No active hip abduction x 6 wks.     Mobility  Bed Mobility               General bed mobility comments: refused    Transfers                        Ambulation/Gait                   Stairs             Wheelchair Mobility     Tilt Bed    Modified Rankin (Stroke Patients Only)       Balance                                            Communication Communication Communication: Impaired Factors Affecting Communication: Difficulty expressing self;Reduced clarity of speech  Cognition Arousal: Alert Behavior During Therapy: Flat affect                             Following commands: Impaired Following commands impaired: Follows one step commands inconsistently    Cueing Cueing Techniques: Verbal cues, Visual cues, Gestural cues, Tactile cues  Exercises Other Exercises Other Exercises: general LE ex - limited by pt    General Comments        Pertinent Vitals/Pain Pain  Assessment Pain Assessment: Faces Faces Pain Scale: Hurts little more Pain Location: general RLE pain but seems generally comfortable for limited activity Pain Descriptors / Indicators: Sore Pain Intervention(s): Limited activity within patient's tolerance, Monitored during session    Home Living                          Prior Function            PT Goals (current goals can now be found in the care plan section) Progress towards PT goals: Not progressing toward goals - comment    Frequency    Min 2X/week      PT Plan      Co-evaluation              AM-PAC PT 6 Clicks Mobility   Outcome Measure  Help needed turning from your back to your side while in a flat bed without using bedrails?: Total Help needed moving from lying on your back to sitting on the side of a flat bed without using bedrails?: Total Help needed moving to and from a bed to a chair (including a  wheelchair)?: Total Help needed standing up from a chair using your arms (e.g., wheelchair or bedside chair)?: Total Help needed to walk in hospital room?: Total Help needed climbing 3-5 steps with a railing? : Total 6 Click Score: 6    End of Session Equipment Utilized During Treatment: Gait belt Activity Tolerance: Patient limited by lethargy Patient left: in bed;with call bell/phone within reach;with bed alarm set Nurse Communication: Mobility status;Other (comment) PT Visit Diagnosis: Unsteadiness on feet (R26.81);Muscle weakness (generalized) (M62.81);Difficulty in walking, not elsewhere classified (R26.2)     Time: 9060-9051 PT Time Calculation (min) (ACUTE ONLY): 9 min  Charges:    $Therapeutic Exercise: 8-22 mins PT General Charges $$ ACUTE PT VISIT: 1 Visit                    Lauraine Gills, PTA 10/10/23, 1:00 PM

## 2023-10-10 NOTE — Progress Notes (Signed)
 PROGRESS NOTE    John Spears  FMW:[id number] DOB: 26-Apr-1950 DOA: 10/06/2023 PCP: Patient, No Pcp Per     Brief Narrative:   From admission h and p  John Spears is a 73 y.o. male with medical history significant of history of CVA, seizure disorder on AEDs, dilated cardiomyopathy with recovered EF, chronic kidney disease stage IIIa, dyslipidemia, atrial fibrillation on Xarelto , and BPH.  Patient was brought to the ED after experiencing a fall.  He reported to triage that he was walking to the mailbox on Monday and lost his balance.  Was complaining of pain in his right hip and showed the nurse abrasions to the back of his hand.  He apparently fell again on Tuesday but history was inconsistent and he could not confirm whether he did his head or how the fall occurred.  He admitted to drinking alcohol.  In the ED he was afebrile and vital signs were stable.  Labs were unremarkable and at baseline.  Because he takes Xarelto  and was unclear as to whether he had his had a CT of the head was obtained to rule out traumatic bleeding.  This was unremarkable in regards to acute bleeding but there was a finding of left parietal cortical encephalomalacia related to remote posterior left MCA territory infarct.  Because he was complaining of right pain a CT was obtained and this did reveal a nondisplaced right trochanteric periprosthetic fracture noting he has had a prior right hemiarthroplasty.  Hospitalist service has been asked to evaluate the patient for admission.   Assessment & Plan:   Principal Problem:   Periprosthetic intertrochanteric fracture of femur Active Problems:   Atrial fibrillation (HCC)   Essential hypertension   Chronic diastolic CHF (congestive heart failure) (HCC)   BPH (benign prostatic hyperplasia)   Alcohol abuse   Cerebrovascular accident (CVA) (HCC)   Seizures (HCC)  Recurrent falls Acute periprosthetic right trochanteric nondisplaced fracture Has been evaluated  by orthopedic team and recommendation is for nonsurgical treatment and has been a while WBAT Percocet for pain Early mobilization with PT/OT evaluation, advising snf Lives with sister and has been noted to have frequent falls and has been to the ER 6 times this year with either falls and or breakthrough seizures.   TOC consulted for snf placement  Seizure disorder With seizure 8/14, rapid response.  Continue preadmission Vimpat  and Keppra  IM midazolam  as lost IV access, seizure resolved with this Dr matthews advises no change in antiepileptics, seizure likely provoked by etoh withdrawal No seizures since  Cough New on 8/15, none since, cxr shows possible lll infiltrate but procal neg, no leukocytosis, no fever, no ongoing cough, covid/flu/rsv neg,   - monitor  Alcohol abuse Patient states he drinks two 16 ounce beers daily but is a poor historian Continue CIWA protocol and utilize oral Ativan  Mild to no withdrawal symptoms today   History of CVA Continue statin and Xarelto    Atrial fibrillation Rate controlled, stable Continue home Xarelto  Continue carvedilol   Dementia stable   History of dilated cardiomyopathy now with HFrecEF/hypertension Continue Norvasc , and carvedilol  Last echo was in 2024 and revealed preserved EF with positive diastolic dysfunction parameters and mild LVH Resumed home lasix    Hypertension Bp controlled Continuing home amlodipine  Added back home lasix  on 8/16   Dyslipidemia Continue Lipitor   BPH Continue Flomax  Monitor for acute urinary retention especially with requirement for narcotics for trochanteric fracture  Constipation Resolved with lactulose , BM on 8/16 - cont miralax /senna  Low  vision Needs assistance with eating, ordered    DVT prophylaxis: apixaban  Code Status: full Family Communication: sister betty updated telephonically 8/17  Level of care: Telemetry Medical Status is: Inpatient Remains inpatient appropriate because:  pending snf placement    Consultants:  none  Procedures: none  Antimicrobials:  none    Subjective: Denies cough or sob. Tolerating diet. Stable right leg pain.  Objective: Vitals:   10/10/23 0351 10/10/23 0720 10/10/23 0822 10/10/23 1013  BP: (!) 153/73  133/61   Pulse: 68  (!) 55 68  Resp:  16 16   Temp: 97.9 F (36.6 C)  98.1 F (36.7 C)   TempSrc:   Oral   SpO2: 100%  100%   Weight:      Height:        Intake/Output Summary (Last 24 hours) at 10/10/2023 1437 Last data filed at 10/10/2023 1300 Gross per 24 hour  Intake 840 ml  Output 500 ml  Net 340 ml   Filed Weights   10/05/23 1921  Weight: 76.2 kg    Examination:  General exam: NAD Respiratory system: Clear to auscultation. Respiratory effort normal. Cardiovascular system: S1 & S2 heard, RRR.   Gastrointestinal system: Abdomen is nondistended, soft and nontender.   Central nervous system: rest tremor, moving all 4, alert Extremities: warm, no edema Skin: No rashes, lesions or ulcers Psychiatry: calm    Data Reviewed: I have personally reviewed following labs and imaging studies  CBC: Recent Labs  Lab 10/06/23 0220 10/09/23 0428 10/10/23 0503  WBC 8.1 6.2 8.0  NEUTROABS 5.5  --   --   HGB 10.4* 9.7* 10.3*  HCT 32.3* 31.1* 31.9*  MCV 84.1 85.7 83.7  PLT 233 208 216   Basic Metabolic Panel: Recent Labs  Lab 10/06/23 0220 10/08/23 0552 10/09/23 0428 10/10/23 0503  NA 137 140 140 140  K 4.2 3.8 3.7 3.9  CL 105 110 107 108  CO2 20* 24 23 25   GLUCOSE 104* 97 84 80  BUN 17 16 17 17   CREATININE 1.08 1.10 0.99 1.03  CALCIUM  9.3 8.9 8.9 9.5   GFR: Estimated Creatinine Clearance: 70.1 mL/min (by C-G formula based on SCr of 1.03 mg/dL). Liver Function Tests: No results for input(s): AST, ALT, ALKPHOS, BILITOT, PROT, ALBUMIN in the last 168 hours. No results for input(s): LIPASE, AMYLASE in the last 168 hours. No results for input(s): AMMONIA in the last 168  hours. Coagulation Profile: No results for input(s): INR, PROTIME in the last 168 hours. Cardiac Enzymes: No results for input(s): CKTOTAL, CKMB, CKMBINDEX, TROPONINI in the last 168 hours. BNP (last 3 results) No results for input(s): PROBNP in the last 8760 hours. HbA1C: No results for input(s): HGBA1C in the last 72 hours. CBG: No results for input(s): GLUCAP in the last 168 hours. Lipid Profile: No results for input(s): CHOL, HDL, LDLCALC, TRIG, CHOLHDL, LDLDIRECT in the last 72 hours. Thyroid Function Tests: No results for input(s): TSH, T4TOTAL, FREET4, T3FREE, THYROIDAB in the last 72 hours. Anemia Panel: No results for input(s): VITAMINB12, FOLATE, FERRITIN, TIBC, IRON , RETICCTPCT in the last 72 hours.  Urine analysis:    Component Value Date/Time   COLORURINE STRAW (A) 08/26/2023 0857   APPEARANCEUR CLEAR (A) 08/26/2023 0857   LABSPEC 1.025 08/26/2023 0857   PHURINE 6.0 08/26/2023 0857   GLUCOSEU NEGATIVE 08/26/2023 0857   HGBUR NEGATIVE 08/26/2023 0857   BILIRUBINUR NEGATIVE 08/26/2023 0857   KETONESUR NEGATIVE 08/26/2023 0857   PROTEINUR NEGATIVE 08/26/2023 0857  NITRITE NEGATIVE 08/26/2023 0857   LEUKOCYTESUR NEGATIVE 08/26/2023 0857   Sepsis Labs: @LABRCNTIP (procalcitonin:4,lacticidven:4)  ) Recent Results (from the past 240 hours)  Resp panel by RT-PCR (RSV, Flu A&B, Covid) Anterior Nasal Swab     Status: None   Collection Time: 10/08/23  1:51 PM   Specimen: Anterior Nasal Swab  Result Value Ref Range Status   SARS Coronavirus 2 by RT PCR NEGATIVE NEGATIVE Final    Comment: (NOTE) SARS-CoV-2 target nucleic acids are NOT DETECTED.  The SARS-CoV-2 RNA is generally detectable in upper respiratory specimens during the acute phase of infection. The lowest concentration of SARS-CoV-2 viral copies this assay can detect is 138 copies/mL. A negative result does not preclude SARS-Cov-2 infection and should not  be used as the sole basis for treatment or other patient management decisions. A negative result may occur with  improper specimen collection/handling, submission of specimen other than nasopharyngeal swab, presence of viral mutation(s) within the areas targeted by this assay, and inadequate number of viral copies(<138 copies/mL). A negative result must be combined with clinical observations, patient history, and epidemiological information. The expected result is Negative.  Fact Sheet for Patients:  BloggerCourse.com  Fact Sheet for Healthcare Providers:  SeriousBroker.it  This test is no t yet approved or cleared by the [place]  FDA and  has been authorized for detection and/or diagnosis of SARS-CoV-2 by FDA under an Emergency Use Authorization (EUA). This EUA will remain  in effect (meaning this test can be used) for the duration of the COVID-19 declaration under Section 564(b)(1) of the Act, 21 U.S.C.section 360bbb-3(b)(1), unless the authorization is terminated  or revoked sooner.       Influenza A by PCR NEGATIVE NEGATIVE Final   Influenza B by PCR NEGATIVE NEGATIVE Final    Comment: (NOTE) The Xpert Xpress SARS-CoV-2/FLU/RSV plus assay is intended as an aid in the diagnosis of influenza from Nasopharyngeal swab specimens and should not be used as a sole basis for treatment. Nasal washings and aspirates are unacceptable for Xpert Xpress SARS-CoV-2/FLU/RSV testing.  Fact Sheet for Patients: BloggerCourse.com  Fact Sheet for Healthcare Providers: SeriousBroker.it  This test is not yet approved or cleared by the [place]  FDA and has been authorized for detection and/or diagnosis of SARS-CoV-2 by FDA under an Emergency Use Authorization (EUA). This EUA will remain in effect (meaning this test can be used) for the duration of the COVID-19 declaration under Section  564(b)(1) of the Act, 21 U.S.C. section 360bbb-3(b)(1), unless the authorization is terminated or revoked.     Resp Syncytial Virus by PCR NEGATIVE NEGATIVE Final    Comment: (NOTE) Fact Sheet for Patients: BloggerCourse.com  Fact Sheet for Healthcare Providers: SeriousBroker.it  This test is not yet approved or cleared by the [place]  FDA and has been authorized for detection and/or diagnosis of SARS-CoV-2 by FDA under an Emergency Use Authorization (EUA). This EUA will remain in effect (meaning this test can be used) for the duration of the COVID-19 declaration under Section 564(b)(1) of the Act, 21 U.S.C. section 360bbb-3(b)(1), unless the authorization is terminated or revoked.  Performed at [facility], [address], [place], [place] [address]          Radiology Studies: No results found.        Scheduled Meds:  amLODipine   10 mg Oral QHS   aspirin  EC  81 mg Oral Daily   atorvastatin   40 mg Oral QHS   carvedilol   12.5 mg Oral BID  cyanocobalamin   1,000 mcg Oral q AM   folic acid   1 mg Oral q AM   furosemide   20 mg Oral Daily   gabapentin   100 mg Oral BID   lacosamide   150 mg Oral BID   levETIRAcetam   1,500 mg Oral BID   multivitamin with minerals  1 tablet Oral Daily   multivitamins with iron   1 tablet Oral QHS   polyethylene glycol  34 g Oral Daily   rivaroxaban   20 mg Oral Q supper   senna  1 tablet Oral Daily   sodium chloride  flush  3 mL Intravenous Q12H   tamsulosin   0.4 mg Oral QHS   thiamine   100 mg Oral Daily   Or   thiamine   100 mg Intravenous Daily   Continuous Infusions:   LOS: 4 days     Devaughn KATHEE Ban, MD [facility]   If 7PM-7AM, please contact night-coverage [email or web address] Password [facility] 10/10/2023, 2:37 PM

## 2023-10-10 NOTE — Hospital Course (Addendum)
 73 y.o. M with dementia, lives at home, EtOH, pAF on Xarelto , history seizures, history stroke without residuals, and sCHF recovered EF presented with nondisplaced periprosthetic hip fracture.   Ortho consulted, recommended non-operative mgmt.

## 2023-10-10 NOTE — Plan of Care (Signed)

## 2023-10-11 DIAGNOSIS — Z96649 Presence of unspecified artificial hip joint: Secondary | ICD-10-CM | POA: Diagnosis not present

## 2023-10-11 DIAGNOSIS — M978XXA Periprosthetic fracture around other internal prosthetic joint, initial encounter: Secondary | ICD-10-CM | POA: Diagnosis not present

## 2023-10-11 LAB — GLUCOSE, CAPILLARY: Glucose-Capillary: 115 mg/dL — ABNORMAL HIGH (ref 70–99)

## 2023-10-11 NOTE — Progress Notes (Signed)
  Progress Note   Patient: John Spears FMW:[id number] DOB: [birth year] DOA: 10/06/2023     5 DOS: the patient was seen and examined on 10/11/2023 at 10:47AM      Brief hospital course: 73 y.o. M with dementia, lives at home, EtOH, pAF on Xarelto , history seizures, history stroke without residuals, and sCHF recovered EF presented with nondisplaced periprosthetic hip fracture.   Ortho consulted, recommended non-operative mgmt.      Assessment and Plan: Acute right hip periprosthetic fracture Admitted and orthopedics consulted.  They recommend nonsurgical treatment, WBAT - WBAT - Consult TOC for SNF placement - PT/OT  Epilepsy Had 1 seizure on hospital day 2, neurology involved, seizure likely provoked by alcohol withdrawal, no seizures since - Continue Vimpat , Keppra   Paroxysmal atrial fibrillation Rate controlled -Continue Xarelto , carvedilol   Dementia At baseline  Chronic systolic and diastolic congestive heart failure with recovered EF Hypertension Cerebrovascular disease Appears euvolemic, blood pressure overall controlled - Continue amlodipine , carvedilol  - Continue aspirin , Lipitor  BPH -Continue Flomax   Alcohol abuse Alcohol withdrawal Reported prior to admission alcohol use.  Had 1 seizure here, now greater than 5 days out from last drink, no further signs of withdrawal. -Continue thiamine  and folate       Subjective: Patient denies complaints.  He feels that his pain is well-controlled.  With his dementia, he is an unreliable historian.  Nursing of no concerns, no fever or respiratory symptoms.     Physical Exam: BP 133/71 (BP Location: Right Arm)   Pulse (!) 58   Temp 97.6 F (36.4 C) (Oral)   Resp 18   Ht 5' 11 (1.803 m)   Wt 76.2 kg   SpO2 100%   BMI 23.43 kg/m   Elderly adult male, resting tremor, lying in bed, mostly responds to me with his eyes closed RRR, no murmurs, no peripheral edema Respiratory rate normal, lungs clear  without rales or wheezes Abdomen soft no tenderness palpation or guarding, no ascites or distention He has a slow resting tremor, he mostly keeps his eyes closed, he is oriented to self, does not know the month or year, not able to state where he is    Data Reviewed: Basic metabolic panel yesterday showed normal electrolyte and renal function CBC shows mild stable anemia     Family Communication:      Disposition: Status is: Inpatient Elderly adult male, admitted with periprosthetic fracture, orthopedics recommended nonoperative care, stable for transition to rehab when bed available        Author: Lonni SHAUNNA Dalton, MD 10/11/2023 4:35 PM  For on call review [email or web address].

## 2023-10-11 NOTE — Progress Notes (Signed)
 Occupational Therapy Treatment Patient Details Name: John Spears MRN: [id number] DOB: Jul 03, 1950 Today's Date: 10/11/2023   History of present illness 73 y.o. male s/p recent fall and subsequent right hip pain, but was still able to ambulate.  He then had another fall and had difficulty with ambulation afterwards.  He has had prior right hip replacement over 10 years ago, and he cannot recall the surgeon.  Imaging in the emergency department showed a minimally displaced greater trochanteric periprosthetic fracture - no plans for surgerical intervention.  PMH:  Seizures.   OT comments  Pt seen for OT tx with PTA for co-tx. Pt tolerated session very well. Premedicated for pain, much more alert and able to follow commands well. Pt seated EOB with PTA upon OT arrival. Pt required MIN A +2 for standing with cues for feet placement and tactile cues for hand placement on RW once standing. Pt tolerated standing at the sink with UE support on the counter for stability and CGA+2 to shave his face for ~87min before requesting to continue from seated EOB 2/2 R hip pain. Tolerated another sitting EOB to complete shaving with MIN A for throughness 2/2 pt citing decreased vision. Pt demonstrating excellent progress towards goals this date. Continues to benefit from skilled OT services.       If plan is discharge home, recommend the following:  Two people to help with walking and/or transfers;A lot of help with bathing/dressing/bathroom;Assistance with cooking/housework;Help with stairs or ramp for entrance;Assist for transportation   Equipment Recommendations  Other (comment) (defer)       Precautions / Restrictions Precautions Precautions: Fall Recall of Precautions/Restrictions: Impaired Precaution/Restrictions Comments: Seizures; CIWA Restrictions Weight Bearing Restrictions Per Provider Order: Yes RLE Weight Bearing Per Provider Order: Weight bearing as tolerated Other Position/Activity  Restrictions: Per ortho consult R LE is WBAT for non surgical fx. No active hip abduction x 6 wks.       Mobility Bed Mobility Overal bed mobility: Needs Assistance Bed Mobility: Supine to Sit, Sit to Supine     Supine to sit: HOB elevated, Contact guard Sit to supine: Min assist, +2 for physical assistance, HOB elevated   General bed mobility comments: assist back to bed to maintain precautions    Transfers Overall transfer level: Needs assistance Equipment used: Rolling walker (2 wheels) Transfers: Sit to/from Stand Sit to Stand: +2 physical assistance, Min assist       General transfer comment: cues for foot placement     Balance Overall balance assessment: Needs assistance Sitting-balance support: Bilateral upper extremity supported, Feet supported, Single extremity supported Sitting balance-Leahy Scale: Fair     Standing balance support: Reliant on assistive device for balance, Single extremity supported, During functional activity Standing balance-Leahy Scale: Fair       ADL either performed or assessed with clinical judgement   ADL Overall ADL's : Needs assistance/impaired     Grooming: Sitting;Standing;Wash/dry face;Wash/dry hands;Minimal assistance Grooming Details (indicate cue type and reason): Pt tolerated standing at the sink with UE support on the counter for stability and CGA+2 to shave his face for ~68min in standing before requesting to continue from seated EOB. Tolerated another sitting EOB to complete shaving with MIN A for throughness 2/2 pt citing decreased vision.                                     Communication Communication Communication: Impaired Factors Affecting  Communication: Difficulty expressing self;Reduced clarity of speech   Cognition Arousal: Alert Behavior During Therapy: Flat affect Cognition: No apparent impairments        Following commands: Intact        Cueing   Cueing Techniques: Verbal cues,  Visual cues, Gestural cues, Tactile cues             Pertinent Vitals/ Pain       Pain Assessment Pain Assessment: Faces Faces Pain Scale: Hurts little more Pain Location: general RLE pain Pain Descriptors / Indicators: Sore, Grimacing, Guarding Pain Intervention(s): Limited activity within patient's tolerance, Monitored during session, Premedicated before session, Repositioned   Frequency  Min 2X/week        Progress Toward Goals  OT Goals(current goals can now be found in the care plan section)  Progress towards OT goals: Progressing toward goals  Acute Rehab OT Goals Patient Stated Goal: to decrease pain OT Goal Formulation: With patient Time For Goal Achievement: 10/20/23 Potential to Achieve Goals: Good  Plan      Co-evaluation    PT/OT/SLP Co-Evaluation/Treatment: Yes Reason for Co-Treatment: For patient/therapist safety;To address functional/ADL transfers PT goals addressed during session: Mobility/safety with mobility;Balance;Proper use of DME OT goals addressed during session: ADL's and self-care      AM-PAC OT 6 Clicks Daily Activity     Outcome Measure   Help from another person eating meals?: A Little Help from another person taking care of personal grooming?: A Little Help from another person toileting, which includes using toliet, bedpan, or urinal?: A Lot Help from another person bathing (including washing, rinsing, drying)?: A Lot Help from another person to put on and taking off regular upper body clothing?: A Lot Help from another person to put on and taking off regular lower body clothing?: A Lot 6 Click Score: 14    End of Session Equipment Utilized During Treatment: Rolling walker (2 wheels);Gait belt  OT Visit Diagnosis: Unsteadiness on feet (R26.81);Repeated falls (R29.6);Muscle weakness (generalized) (M62.81)   Activity Tolerance Patient tolerated treatment well   Patient Left in bed;with call bell/phone within reach;with bed  alarm set   Nurse Communication Mobility status        Time: 9151-9085 OT Time Calculation (min): 26 min  Charges: OT General Charges $OT Visit: 1 Visit OT Treatments $Self Care/Home Management : 8-22 mins  Warren SAUNDERS., MPH, MS, OTR/L ascom [phone] 10/11/23, 9:26 AM

## 2023-10-11 NOTE — Plan of Care (Signed)
  Problem: Clinical Measurements: Goal: Ability to maintain clinical measurements within normal limits will improve Outcome: Progressing Goal: Will remain free from infection Outcome: Progressing Goal: Diagnostic test results will improve Outcome: Progressing Goal: Respiratory complications will improve Outcome: Progressing Goal: Cardiovascular complication will be avoided Outcome: Progressing   Problem: Activity: Goal: Risk for activity intolerance will decrease Outcome: Progressing   Problem: Coping: Goal: Level of anxiety will decrease Outcome: Progressing   Problem: Elimination: Goal: Will not experience complications related to bowel motility Outcome: Progressing Goal: Will not experience complications related to urinary retention Outcome: Progressing   Problem: Pain Managment: Goal: General experience of comfort will improve and/or be controlled Outcome: Progressing   Problem: Safety: Goal: Ability to remain free from injury will improve Outcome: Progressing   Problem: Skin Integrity: Goal: Risk for impaired skin integrity will decrease Outcome: Progressing   Problem: Education: Goal: Individualized Educational Video(s) Outcome: Progressing   Problem: Clinical Measurements: Goal: Postoperative complications will be avoided or minimized Outcome: Progressing   Problem: Pain Management: Goal: Pain level will decrease Outcome: Progressing   Problem: Education: Goal: Knowledge of General Education information will improve Description: Including pain rating scale, medication(s)/side effects and non-pharmacologic comfort measures Outcome: Not Progressing   Problem: Health Behavior/Discharge Planning: Goal: Ability to manage health-related needs will improve Outcome: Not Progressing   Problem: Nutrition: Goal: Adequate nutrition will be maintained Outcome: Not Progressing   Problem: Education: Goal: Verbalization of understanding the information provided  (i.e., activity precautions, restrictions, etc) will improve Outcome: Not Progressing   Problem: Activity: Goal: Ability to ambulate and perform ADLs will improve Outcome: Not Progressing   Problem: Self-Concept: Goal: Ability to maintain and perform role responsibilities to the fullest extent possible will improve Outcome: Not Progressing

## 2023-10-11 NOTE — Progress Notes (Signed)
 Physical Therapy Treatment Patient Details Name: Sahand Gosch MRN: [id number] DOB: 1950-05-12 Today's Date: 10/11/2023   History of Present Illness 73 y.o. male s/p recent fall and subsequent right hip pain, but was still able to ambulate.  He then had another fall and had difficulty with ambulation afterwards.  He has had prior right hip replacement over 10 years ago, and he cannot recall the surgeon.  Imaging in the emergency department showed a minimally displaced greater trochanteric periprosthetic fracture - no plans for surgerical intervention.  PMH:  Seizures.    PT Comments  Co-tx for improved outcomes,  Pt awake in bed today and asking to shave and have haircut.  Offered assistance to shave.  He is able to transition to.sitting with cga x 1 with excellent motivation.  He is able to get to EOB with time and is steady in sitting.  Stands to RW with min a x 2 and is able to stand x 12 minutes to assist with shaving.  He needs cues to stand fully and for general safety and sequencing with shaving with assist from OT.  He takes a short seated rest on bed before standing again to take several small steps towards [facility] to reposition.  Stands again to have his gown tied.  Sits EOB for about 10 minutes total before returning back to supine with min a x 2.   Pt with excellent participation and progress today towards goals.  He does opt to return to supine for breakfast and bed placed in upright position to simulate chair.  Will plan to encourage OOB tomorrow to chair.   If plan is discharge home, recommend the following: Assist for transportation;Help with stairs or ramp for entrance;Two people to help with walking and/or transfers;A lot of help with bathing/dressing/bathroom   Can travel by private vehicle        Equipment Recommendations  Rolling walker (2 wheels);BSC/3in1;Wheelchair (measurements PT);Wheelchair cushion (measurements PT)    Recommendations for Other Services        Precautions / Restrictions Precautions Precautions: Fall Recall of Precautions/Restrictions: Impaired Precaution/Restrictions Comments: Seizures; CIWA Restrictions Weight Bearing Restrictions Per Provider Order: Yes RLE Weight Bearing Per Provider Order: Weight bearing as tolerated Other Position/Activity Restrictions: Per ortho consult R LE is WBAT for non surgical fx. No active hip abduction x 6 wks.     Mobility  Bed Mobility Overal bed mobility: Needs Assistance Bed Mobility: Supine to Sit, Sit to Supine     Supine to sit: Min assist, +2 for physical assistance Sit to supine: Min assist, +2 for physical assistance     Patient Response: Cooperative  Transfers Overall transfer level: Needs assistance Equipment used: Rolling walker (2 wheels) Transfers: Sit to/from Stand Sit to Stand: From elevated surface           General transfer comment: min a x 2 to stand for safety    Ambulation/Gait Ambulation/Gait assistance: Min assist, +2 safety/equipment Gait Distance (Feet): 2 Feet Assistive device: Rolling walker (2 wheels) Gait Pattern/deviations: Step-to pattern Gait velocity: decreased     General Gait Details: takes several sidesteps along EOB to reposition   Stairs             Wheelchair Mobility     Tilt Bed Tilt Bed Patient Response: Cooperative  Modified Rankin (Stroke Patients Only)       Balance Overall balance assessment: Needs assistance Sitting-balance support: Bilateral upper extremity supported, Feet supported, Single extremity supported Sitting balance-Leahy Scale: Fair  Standing balance support: Reliant on assistive device for balance, Single extremity supported, During functional activity Standing balance-Leahy Scale: Fair Standing balance comment: +2 for safety with standing  for general support and cues for standing upright as stance remains crouched                            Communication  Communication Communication: Impaired Factors Affecting Communication: Difficulty expressing self;Reduced clarity of speech  Cognition Arousal: Alert Behavior During Therapy: WFL for tasks assessed/performed   PT - Cognitive impairments: Awareness, Attention, Sequencing, Problem solving, Safety/Judgement                       PT - Cognition Comments: significant improvement today Following commands: Intact Following commands impaired: Follows one step commands inconsistently    Cueing    Exercises      General Comments        Pertinent Vitals/Pain      Home Living                          Prior Function            PT Goals (current goals can now be found in the care plan section) Progress towards PT goals: Progressing toward goals    Frequency    Min 2X/week      PT Plan      Co-evaluation PT/OT/SLP Co-Evaluation/Treatment: Yes Reason for Co-Treatment: For patient/therapist safety;To address functional/ADL transfers PT goals addressed during session: Mobility/safety with mobility;Balance;Proper use of DME OT goals addressed during session: ADL's and self-care      AM-PAC PT 6 Clicks Mobility   Outcome Measure  Help needed turning from your back to your side while in a flat bed without using bedrails?: A Lot Help needed moving from lying on your back to sitting on the side of a flat bed without using bedrails?: A Little Help needed moving to and from a bed to a chair (including a wheelchair)?: A Lot Help needed standing up from a chair using your arms (e.g., wheelchair or bedside chair)?: A Lot Help needed to walk in hospital room?: A Lot Help needed climbing 3-5 steps with a railing? : Total 6 Click Score: 12    End of Session Equipment Utilized During Treatment: Gait belt Activity Tolerance: Patient tolerated treatment well Patient left: in bed;with call bell/phone within reach;with bed alarm set Nurse Communication: Mobility  status;Other (comment) PT Visit Diagnosis: Unsteadiness on feet (R26.81);Muscle weakness (generalized) (M62.81);Difficulty in walking, not elsewhere classified (R26.2)     Time: 9151-9085 PT Time Calculation (min) (ACUTE ONLY): 26 min  Charges:    $Therapeutic Activity: 8-22 mins PT General Charges $$ ACUTE PT VISIT: 1 Visit                    Lauraine Gills, PTA 10/11/23, 9:29 AM

## 2023-10-12 DIAGNOSIS — I4891 Unspecified atrial fibrillation: Secondary | ICD-10-CM

## 2023-10-12 DIAGNOSIS — R569 Unspecified convulsions: Secondary | ICD-10-CM | POA: Diagnosis not present

## 2023-10-12 DIAGNOSIS — I5032 Chronic diastolic (congestive) heart failure: Secondary | ICD-10-CM

## 2023-10-12 DIAGNOSIS — M978XXA Periprosthetic fracture around other internal prosthetic joint, initial encounter: Secondary | ICD-10-CM | POA: Diagnosis not present

## 2023-10-12 LAB — CBC
HCT: 30.4 % — ABNORMAL LOW (ref 39.0–52.0)
Hemoglobin: 9.9 g/dL — ABNORMAL LOW (ref 13.0–17.0)
MCH: 27.1 pg (ref 26.0–34.0)
MCHC: 32.6 g/dL (ref 30.0–36.0)
MCV: 83.3 fL (ref 80.0–100.0)
Platelets: 198 K/uL (ref 150–400)
RBC: 3.65 MIL/uL — ABNORMAL LOW (ref 4.22–5.81)
RDW: 15 % (ref 11.5–15.5)
WBC: 6.1 K/uL (ref 4.0–10.5)
nRBC: 0 % (ref 0.0–0.2)

## 2023-10-12 LAB — BASIC METABOLIC PANEL WITH GFR
Anion gap: 9 (ref 5–15)
BUN: 23 mg/dL (ref 8–23)
CO2: 26 mmol/L (ref 22–32)
Calcium: 9.5 mg/dL (ref 8.9–10.3)
Chloride: 102 mmol/L (ref 98–111)
Creatinine, Ser: 0.97 mg/dL (ref 0.61–1.24)
GFR, Estimated: >60 mL/min (ref >60)
Glucose, Bld: 78 mg/dL (ref 70–99)
Potassium: 4 mmol/L (ref 3.5–5.1)
Sodium: 137 mmol/L (ref 135–145)

## 2023-10-12 MED ORDER — SENNA 8.6 MG PO TABS: 1.0000 | ORAL_TABLET | Freq: Every day | ORAL | 0 refills | Status: DC

## 2023-10-12 MED ORDER — OXYCODONE-ACETAMINOPHEN 5-325 MG PO TABS: 1.0000 | ORAL_TABLET | Freq: Four times a day (QID) | ORAL | 0 refills | Status: DC | PRN

## 2023-10-12 MED ORDER — POLYETHYLENE GLYCOL 3350 17 G PO PACK: 34.0000 g | PACK | Freq: Every day | ORAL | 0 refills | Status: AC

## 2023-10-12 NOTE — Plan of Care (Signed)

## 2023-10-12 NOTE — Plan of Care (Signed)
   Problem: Clinical Measurements: Goal: Respiratory complications will improve Outcome: Progressing   Problem: Activity: Goal: Risk for activity intolerance will decrease Outcome: Progressing

## 2023-10-12 NOTE — Progress Notes (Signed)
 Occupational Therapy Treatment Patient Details Name: John Spears MRN: [id number] DOB: 1950/09/19 Today's Date: 10/12/2023   History of present illness 73 y.o. male s/p recent fall and subsequent right hip pain, but was still able to ambulate.  He then had another fall and had difficulty with ambulation afterwards.  He has had prior right hip replacement over 10 years ago, and he cannot recall the surgeon.  Imaging in the emergency department showed a minimally displaced greater trochanteric periprosthetic fracture - no plans for surgerical intervention.  PMH:  Seizures.   OT comments  Pt seen for OT tx immediately following PT session. Pt back in room from ambulating with PTA and agreeable to session. Pt set up for seated bathing, requiring MIN A for washing back and CGA in standing to complete LB bathing. Mild impulsivity noted. Pt tolerated well despite R hip pain. Pt making excellent progress towards goals. Continues to benefit from skilled OT Services.       If plan is discharge home, recommend the following:  A little help with walking and/or transfers;A little help with bathing/dressing/bathroom;Assistance with cooking/housework;Assist for transportation;Help with stairs or ramp for entrance;Direct supervision/assist for medications management;Direct supervision/assist for financial management;Supervision due to cognitive status   Equipment Recommendations  Other (comment) (defer)    Recommendations for Other Services      Precautions / Restrictions Precautions Precautions: Fall Recall of Precautions/Restrictions: Impaired Precaution/Restrictions Comments: Seizures; CIWA Restrictions Weight Bearing Restrictions Per Provider Order: Yes RLE Weight Bearing Per Provider Order: Weight bearing as tolerated Other Position/Activity Restrictions: Per ortho consult R LE is WBAT for non surgical fx. No active hip abduction x 6 wks.       Mobility Bed Mobility                General bed mobility comments: NT, in recliner at start and end of session    Transfers Overall transfer level: Needs assistance   Transfers: Sit to/from Stand Sit to Stand: Min assist, Contact guard assist                 Balance Overall balance assessment: Needs assistance Sitting-balance support: No upper extremity supported, Feet supported Sitting balance-Leahy Scale: Good Sitting balance - Comments: able to reach and bend without LOB   Standing balance support: No upper extremity supported, During functional activity Standing balance-Leahy Scale: Fair Standing balance comment: no overt LOB with static standing for LB bathing                           ADL either performed or assessed with clinical judgement   ADL Overall ADL's : Needs assistance/impaired     Grooming: Sitting;Wash/dry face;Applying deodorant;Set up;Supervision/safety   Upper Body Bathing: Sitting;Minimal assistance Upper Body Bathing Details (indicate cue type and reason): MIN A for washing back Lower Body Bathing: Sit to/from stand;Sitting/lateral leans;Contact guard assist Lower Body Bathing Details (indicate cue type and reason): CGA in standing Upper Body Dressing : Sitting;Minimal assistance Upper Body Dressing Details (indicate cue type and reason): gown                        Extremity/Trunk Assessment              Vision       Perception     Praxis     Communication Communication Communication: Impaired Factors Affecting Communication: Difficulty expressing self;Reduced clarity of speech   Cognition Arousal: Alert Behavior During  Therapy: WFL for tasks assessed/performed, Impulsive Cognition: No apparent impairments                               Following commands: Intact Following commands impaired: Follows one step commands with increased time      Cueing   Cueing Techniques: Verbal cues, Tactile cues  Exercises      Shoulder  Instructions       General Comments      Pertinent Vitals/ Pain       Pain Assessment Pain Assessment: Faces Faces Pain Scale: Hurts even more Pain Location: general RLE pain Pain Descriptors / Indicators: Sore, Grimacing, Guarding Pain Intervention(s): Limited activity within patient's tolerance, Monitored during session, Repositioned  Home Living                                          Prior Functioning/Environment              Frequency  Min 2X/week        Progress Toward Goals  OT Goals(current goals can now be found in the care plan section)  Progress towards OT goals: Progressing toward goals  Acute Rehab OT Goals Patient Stated Goal: to decrease pain OT Goal Formulation: With patient Time For Goal Achievement: 10/20/23 Potential to Achieve Goals: Good  Plan      Co-evaluation                 AM-PAC OT 6 Clicks Daily Activity     Outcome Measure   Help from another person eating meals?: None Help from another person taking care of personal grooming?: A Little Help from another person toileting, which includes using toliet, bedpan, or urinal?: A Little Help from another person bathing (including washing, rinsing, drying)?: A Little Help from another person to put on and taking off regular upper body clothing?: A Little Help from another person to put on and taking off regular lower body clothing?: A Little 6 Click Score: 19    End of Session    OT Visit Diagnosis: Unsteadiness on feet (R26.81);Repeated falls (R29.6);Muscle weakness (generalized) (M62.81)   Activity Tolerance Patient tolerated treatment well   Patient Left in chair;with call bell/phone within reach;with chair alarm set;with nursing/sitter in room   Nurse Communication          Time: 9046-8995 OT Time Calculation (min): 11 min  Charges: OT General Charges $OT Visit: 1 Visit OT Treatments $Self Care/Home Management : 8-22 mins  Warren SAUNDERS., MPH,  MS, OTR/L ascom [phone] 10/12/23, 10:17 AM

## 2023-10-12 NOTE — Discharge Instructions (Signed)
 Pt advised to establish PCP

## 2023-10-12 NOTE — Progress Notes (Signed)
 [facility]  - [place] [facility]   PATIENT NAME: John Spears    MR#:  [id number]  DATE OF BIRTH:  04-30-1950  SUBJECTIVE:  no family at bedside. Per RN no issues. Patient able to answer most questions appropriately. Tells me he wants to sleep and get some rest    VITALS:  Blood pressure (!) 140/71, pulse 71, temperature 97.8 F (36.6 C), resp. rate 18, height 5' 11 (1.803 m), weight 76.2 kg, SpO2 100%.  PHYSICAL EXAMINATION:   GENERAL:  73 y.o.-year-old patient with no acute distress.  LUNGS: Normal breath sounds bilaterally, no wheezing CARDIOVASCULAR: S1, S2 normal. No murmur   ABDOMEN: Soft, nontender, nondistended. Bowel sounds present.  EXTREMITIES: + edema b/l.    NEUROLOGIC: nonfocal  patient is alert and awake .   LABORATORY PANEL:  CBC Recent Labs  Lab 10/12/23 0609  WBC 6.1  HGB 9.9*  HCT 30.4*  PLT 198    Chemistries  Recent Labs  Lab 10/12/23 0609  NA 137  K 4.0  CL 102  CO2 26  GLUCOSE 78  BUN 23  CREATININE 0.97  CALCIUM  9.5   Assessment and Plan  73 y.o. M with dementia, lives at home, EtOH, pAF on Xarelto , history seizures, history stroke without residuals, and sCHF recovered EF presented with nondisplaced periprosthetic hip fracture.    Ortho Dr. Shila Blanch consulted, recommended non-operative mgmt.    Acute right hip periprosthetic fracture Admitted and orthopedics consulted.  They recommend nonsurgical treatment, WBAT - WBAT - Consult TOC for SNF placement - PT/OT input appreciated   Epilepsy Had 1 seizure on hospital day 2, neurology involved, seizure likely provoked by alcohol withdrawal, no seizures since - Continue Vimpat , Keppra    Paroxysmal atrial fibrillation Rate controlled -Continue Xarelto , carvedilol    Dementia At baseline   Chronic systolic and diastolic congestive heart failure with recovered EF Hypertension Cerebrovascular disease Appears euvolemic, blood pressure overall  controlled - Continue amlodipine , carvedilol  - Continue aspirin , Lipitor   BPH -Continue Flomax    Alcohol abuse Alcohol withdrawal Reported prior to admission alcohol use.  Had 1 seizure here, now greater than 5 days out from last drink, no further signs of withdrawal. -Continue thiamine  and folate     patient appears to be at baseline at present. TOC for discharge planning. Discharge order has been placed   Procedures: none Family communication : none today Consults : ortho, neuro- CODE STATUS: full DVT Prophylaxis : Xarelto  Level of care: Telemetry Medical Status is: Inpatient Remains inpatient appropriate because: medically best at baseline for discharge    TOTAL TIME TAKING CARE OF THIS PATIENT: 35 minutes.  >50% time spent on counselling and coordination of care  Note: This dictation was prepared with Dragon dictation along with smaller phrase technology. Any transcriptional errors that result from this process are unintentional.  Leita Blanch M.D    [facility]   CC: Primary care physician; Patient, No Pcp Per

## 2023-10-12 NOTE — TOC Progression Note (Signed)
 Transition of Care [facility]) - Progression Note    Patient Details  Name: Brittin Janik MRN: [id number] Date of Birth: 05/26/1950  Transition of Care [facility]) CM/SW Contact  Alvaro Louder, [place] Phone Number: 10/12/2023, 2:45 PM  Clinical Narrative:  LCSWA spoke to patient and sister about SNF placement. They both agreed on [facility]. LCSWA reported to the family that when the patient admits to the facility the patient will be in his Co-pay Days. LCSWA let the patient's family know that it will be 1470 for one week. Family were agreeable to paying   Insurance auth Currently pending Auth [id number]  [facility] to folow for Discharge                    Expected Discharge Plan and Services         Expected Discharge Date: 10/12/23                                     Social Drivers of Health (SDOH) Interventions SDOH Screenings   Food Insecurity: No Food Insecurity (10/06/2023)  Housing: Low Risk  (10/06/2023)  Transportation Needs: No Transportation Needs (10/06/2023)  Utilities: Not At Risk (10/06/2023)  Financial Resource Strain: Medium Risk (04/06/2023)   Received from [facility]  Social Connections: Moderately Isolated (10/06/2023)  Tobacco Use: Medium Risk (10/05/2023)    Readmission Risk Interventions     No data to display

## 2023-10-12 NOTE — Progress Notes (Signed)
 Physical Therapy Treatment Patient Details Name: John Spears MRN: [id number] DOB: 1950/03/24 Today's Date: 10/12/2023   History of Present Illness 73 y.o. male s/p recent fall and subsequent right hip pain, but was still able to ambulate.  He then had another fall and had difficulty with ambulation afterwards.  He has had prior right hip replacement over 10 years ago, and he cannot recall the surgeon.  Imaging in the emergency department showed a minimally displaced greater trochanteric periprosthetic fracture - no plans for surgerical intervention.  PMH:  Seizures.    PT Comments  Pt ready for session.  To EOB with rail but no physical assist. Steady in sitting for seated AROM.  Stood with min a x 1 and transferred to chair with min a x 1.  Stood for standing AROM and pre-gait activities.  No ABD RLE.  He is able to progress gait today 67' with RW and cga/min a x 1 with improved posture.  He does take short shuffling steps which he stated help with pain.  Generally comfortable in supine but some inc pain with mobility but is not a barrier.  Remained up in chair with OT in after session.   If plan is discharge home, recommend the following: Assist for transportation;Help with stairs or ramp for entrance;A little help with walking and/or transfers;A little help with bathing/dressing/bathroom   Can travel by private vehicle        Equipment Recommendations  Rolling walker (2 wheels);BSC/3in1;Wheelchair (measurements PT);Wheelchair cushion (measurements PT)    Recommendations for Other Services       Precautions / Restrictions Precautions Precautions: Fall Recall of Precautions/Restrictions: Impaired Precaution/Restrictions Comments: Seizures; CIWA Restrictions Weight Bearing Restrictions Per Provider Order: Yes RLE Weight Bearing Per Provider Order: Weight bearing as tolerated Other Position/Activity Restrictions: Per ortho consult R LE is WBAT for non surgical fx. No active hip  abduction x 6 wks.     Mobility  Bed Mobility Overal bed mobility: Needs Assistance Bed Mobility: Supine to Sit     Supine to sit: Contact guard, HOB elevated, Used rails       Patient Response: Cooperative  Transfers Overall transfer level: Needs assistance Equipment used: Rolling walker (2 wheels) Transfers: Sit to/from Stand Sit to Stand: Min assist                Ambulation/Gait Ambulation/Gait assistance: Editor, commissioning (Feet): 60 Feet Assistive device: Rolling walker (2 wheels) Gait Pattern/deviations: Decreased step length - right, Decreased step length - left, Step-through pattern Gait velocity: decreased     General Gait Details: short shuffling steps with dec height and length   Stairs             Wheelchair Mobility     Tilt Bed Tilt Bed Patient Response: Cooperative  Modified Rankin (Stroke Patients Only)       Balance Overall balance assessment: Needs assistance Sitting-balance support: Bilateral upper extremity supported, Feet supported, Single extremity supported Sitting balance-Leahy Scale: Good     Standing balance support: Reliant on assistive device for balance, Bilateral upper extremity supported Standing balance-Leahy Scale: Fair                              Musician Communication: Impaired Factors Affecting Communication: Difficulty expressing self;Reduced clarity of speech  Cognition Arousal: Alert Behavior During Therapy: WFL for tasks assessed/performed   PT - Cognitive impairments: Awareness, Attention, Sequencing, Problem solving, Safety/Judgement  PT - Cognition Comments: significant improvement today Following commands: Intact Following commands impaired: Follows one step commands inconsistently    Cueing Cueing Techniques: Verbal cues  Exercises Other Exercises Other Exercises: seated and standing arom with no ABD RLE    General  Comments        Pertinent Vitals/Pain Pain Assessment Pain Assessment: Faces Faces Pain Scale: Hurts even more Pain Location: general RLE pain Pain Descriptors / Indicators: Sore, Grimacing, Guarding Pain Intervention(s): Limited activity within patient's tolerance, Monitored during session, Repositioned    Home Living                          Prior Function            PT Goals (current goals can now be found in the care plan section) Progress towards PT goals: Progressing toward goals    Frequency    Min 2X/week      PT Plan      Co-evaluation              AM-PAC PT 6 Clicks Mobility   Outcome Measure  Help needed turning from your back to your side while in a flat bed without using bedrails?: None Help needed moving from lying on your back to sitting on the side of a flat bed without using bedrails?: None Help needed moving to and from a bed to a chair (including a wheelchair)?: A Little Help needed standing up from a chair using your arms (e.g., wheelchair or bedside chair)?: A Little Help needed to walk in hospital room?: A Little Help needed climbing 3-5 steps with a railing? : A Lot 6 Click Score: 19    End of Session Equipment Utilized During Treatment: Gait belt Activity Tolerance: Patient tolerated treatment well Patient left: in chair;with chair alarm set;with call bell/phone within reach;Other (comment) Nurse Communication: Mobility status;Other (comment) PT Visit Diagnosis: Unsteadiness on feet (R26.81);Muscle weakness (generalized) (M62.81);Difficulty in walking, not elsewhere classified (R26.2)     Time: 9061-9047 PT Time Calculation (min) (ACUTE ONLY): 14 min  Charges:    $Gait Training: 8-22 mins PT General Charges $$ ACUTE PT VISIT: 1 Visit                   Lauraine Gills, PTA 10/12/23, 10:02 AM

## 2023-10-13 ENCOUNTER — Inpatient Hospital Stay

## 2023-10-13 DIAGNOSIS — Z96649 Presence of unspecified artificial hip joint: Secondary | ICD-10-CM | POA: Diagnosis not present

## 2023-10-13 DIAGNOSIS — M978XXD Periprosthetic fracture around other internal prosthetic joint, subsequent encounter: Secondary | ICD-10-CM | POA: Diagnosis not present

## 2023-10-13 MED ORDER — MORPHINE SULFATE (PF) 2 MG/ML IV SOLN: 2.0000 mg | INTRAVENOUS | Status: DC | PRN

## 2023-10-13 NOTE — Discharge Summary (Signed)
 Physician Discharge Summary  John Spears FMW:[id number] DOB: Jun 11, 1950 DOA: 10/06/2023  PCP: Patient, No Pcp Per  Admit date: 10/06/2023 Discharge date: 10/13/2023  Admitted From: Home Disposition:  SNF  Recommendations for Outpatient Follow-up:  Follow up with PCP in 1-2 weeks Follow up orthopedics 2 weeks  Home Health:No  Equipment/Devices:None   Discharge Condition:Stable  CODE STATUS:FULL  Diet recommendation: Dysphagia 3  Brief/Interim Summary:  73 y.o. male with medical history significant of history of CVA, seizure disorder on AEDs, dilated cardiomyopathy with recovered EF, chronic kidney disease stage IIIa, dyslipidemia, atrial fibrillation on Xarelto , and BPH.  Patient was brought to the ED after experiencing a fall.  He reported to triage that he was walking to the mailbox on Monday and lost his balance.  Was complaining of pain in his right hip and showed the nurse abrasions to the back of his hand.  He apparently fell again on Tuesday but history was inconsistent and he could not confirm whether he did his head or how the fall occurred.  He admitted to drinking alcohol.  In the ED he was afebrile and vital signs were stable.  Labs were unremarkable and at baseline.  Because he takes Xarelto  and was unclear as to whether he had his had a CT of the head was obtained to rule out traumatic bleeding.  This was unremarkable in regards to acute bleeding but there was a finding of left parietal cortical encephalomalacia related to remote posterior left MCA territory infarct.  Because he was complaining of right pain a CT was obtained and this did reveal a nondisplaced right trochanteric periprosthetic fracture noting he has had a prior right hemiarthroplasty.  Hospitalist service has been asked to evaluate the patient for admission.     Discharge Diagnoses:  Principal Problem:   Periprosthetic intertrochanteric fracture of femur Active Problems:   Atrial fibrillation (HCC)    Essential hypertension   Chronic diastolic CHF (congestive heart failure) (HCC)   BPH (benign prostatic hyperplasia)   Alcohol abuse   Cerebrovascular accident (CVA) (HCC)   Seizures (HCC)  73 y.o. M with dementia, lives at home, EtOH, pAF on Xarelto , history seizures, history stroke without residuals, and sCHF recovered EF presented with nondisplaced periprosthetic hip fracture.    Ortho Dr. Shila Blanch consulted, recommended non-operative mgmt.    Acute right hip periprosthetic fracture Admitted and orthopedics consulted.  They recommend nonsurgical treatment, WBAT - WBAT - SNF placement - 2 week followup with orthopedics Dr. Earnestine Blanch   Epilepsy Had 1 seizure on hospital day 2, neurology involved, seizure likely provoked by alcohol withdrawal, no seizures since - Continue Vimpat , Keppra    Paroxysmal atrial fibrillation Rate controlled -Continue Xarelto , carvedilol    Dementia At baseline   Chronic systolic and diastolic congestive heart failure with recovered EF Hypertension Cerebrovascular disease Appears euvolemic, blood pressure overall controlled - Continue amlodipine , carvedilol  - Continue aspirin , Lipitor   BPH -Continue Flomax    Alcohol abuse Alcohol withdrawal Reported prior to admission alcohol use.  Had 1 seizure here, now greater than 5 days out from last drink, no further signs of withdrawal. -Continue thiamine  and folate    Discharge Instructions  Discharge Instructions     Diet - low sodium heart healthy   Complete by: As directed    Diet - low sodium heart healthy   Complete by: As directed    Increase activity slowly   Complete by: As directed    Increase activity slowly   Complete by: As directed  Allergies as of 10/13/2023   Not on File      Medication List     STOP taking these medications    multivitamin with minerals Tabs tablet       TAKE these medications    acetaminophen  500 MG tablet Commonly known as:  TYLENOL  Take 1 tablet (500 mg total) by mouth every 6 (six) hours as needed for moderate pain (pain score 4-6).   amLODipine  10 MG tablet Commonly known as: NORVASC  Take 10 mg by mouth at bedtime.   Aspirin  Low Dose 81 MG tablet Generic drug: aspirin  EC Take 81 mg by mouth daily.   atorvastatin  40 MG tablet Commonly known as: LIPITOR Take 40 mg by mouth at bedtime.   carvedilol  12.5 MG tablet Commonly known as: COREG  Take 1 tablet (12.5 mg total) by mouth 2 (two) times daily.   cyanocobalamin  1000 MCG tablet Commonly known as: VITAMIN B12 Take 1,000 mcg by mouth in the morning.   feeding supplement Liqd Take 237 mLs by mouth 2 (two) times daily between meals.   folic acid  1 MG tablet Commonly known as: FOLVITE  Take 1 mg by mouth in the morning.   furosemide  20 MG tablet Commonly known as: LASIX  TAKE 1 TABLET BY MOUTH ONCE A DAY   gabapentin  100 MG capsule Commonly known as: NEURONTIN  Take 100 mg by mouth 2 (two) times daily.   Lacosamide  150 MG Tabs Take 1 tablet by mouth 2 (two) times daily. What changed: Another medication with the same name was removed. Continue taking this medication, and follow the directions you see here.   levETIRAcetam  750 MG tablet Commonly known as: KEPPRA  Take 2 tablets (1,500 mg total) by mouth 2 (two) times daily.   multivitamins with iron  Tabs tablet Take 1 tablet by mouth at bedtime.   oxyCODONE -acetaminophen  5-325 MG tablet Commonly known as: PERCOCET/ROXICET Take 1 tablet by mouth every 6 (six) hours as needed for moderate pain (pain score 4-6) or severe pain (pain score 7-10).   polyethylene glycol 17 g packet Commonly known as: MIRALAX  / GLYCOLAX  Take 34 g by mouth daily.   rivaroxaban  20 MG Tabs tablet Commonly known as: XARELTO  Take 1 tablet (20 mg total) by mouth daily with supper.   senna 8.6 MG Tabs tablet Commonly known as: SENOKOT Take 1 tablet (8.6 mg total) by mouth daily.   tamsulosin  0.4 MG Caps  capsule Commonly known as: FLOMAX  Take 0.4 mg by mouth at bedtime.   thiamine  100 MG tablet Commonly known as: VITAMIN B1 Take 100 mg by mouth in the morning.        Contact information for follow-up providers     Tobie Priest, MD. Schedule an appointment as soon as possible for a visit in 2 week(s).   Specialty: Orthopedic Surgery Contact information: [address] [place] [address] [phone]              Contact information for after-discharge care     Destination     [facility] .   Service: Skilled Nursing Contact information: [address] [place]  [phone]                    Not on File  Consultations: Orthopedics Neurology   Procedures/Studies: DG HIP UNILAT WITH PELVIS 2-3 VIEWS RIGHT Result Date: 10/13/2023 CLINICAL DATA:  Fall. Increasing right hip pain. Known greater trochanter fracture. EXAM: DG HIP (WITH OR WITHOUT PELVIS) 2-3V RIGHT COMPARISON:  10/05/2023  FINDINGS: Prior right hip replacement. Again seen is the right greater trochanter periprosthetic fracture, unchanged since prior study. No additional fracture. No subluxation or dislocation. IMPRESSION: Stable appearance of the right greater trochanter fracture. No new fractures seen. Electronically Signed   By: Franky Crease M.D.   On: 10/13/2023 01:26   DG Chest Port 1 View Result Date: 10/08/2023 CLINICAL DATA:  Sudden onset cough EXAM: PORTABLE CHEST 1 VIEW COMPARISON:  None Available. FINDINGS: Normal cardiac silhouette. Subtle patchy airspace disease in the LEFT lower lobe no pleural fluid. No pneumothorax. IMPRESSION: Concern for LEFT lobe pneumonia. Electronically Signed   By: Jackquline Boxer M.D.   On: 10/08/2023 17:52   CT Hip Right Wo Contrast Result Date: 10/06/2023 CLINICAL DATA:  Recent fall with hip pain, initial encounter EXAM: CT OF THE RIGHT HIP WITHOUT CONTRAST TECHNIQUE: Multidetector CT imaging of the right hip  was performed according to the standard protocol. Multiplanar CT image reconstructions were also generated. RADIATION DOSE REDUCTION: This exam was performed according to the departmental dose-optimization program which includes automated exposure control, adjustment of the mA and/or kV according to patient size and/or use of iterative reconstruction technique. COMPARISON:  Plain film from the previous day. FINDINGS: Bones/Joint/Cartilage Changes consistent with right hemiarthroplasty are seen. No dislocation is noted. There is a periprosthetic fracture identified involving the greater trochanter. No significant displacement of the fracture fragment is seen. Ligaments Suboptimally assessed by CT. Muscles and Tendons Surrounding musculature appears within normal limits. Soft tissues No acute soft tissue abnormality is noted. IMPRESSION: Undisplaced greater trochanter fracture similar to that seen on recent plain film examination. Electronically Signed   By: Oneil Devonshire M.D.   On: 10/06/2023 03:15   DG Hip Unilat W or Wo Pelvis 2-3 Views Right Result Date: 10/05/2023 CLINICAL DATA:  Fall, right hip pain EXAM: DG HIP (WITH OR WITHOUT PELVIS) 2-3V RIGHT COMPARISON:  None Available. FINDINGS: Surgical changes of right hip bipolar hemiarthroplasty are identified. There is lucency within the greater trochanter and a a nondisplaced periprosthetic greater trochanter fracture is suspected. No other fracture identified. No dislocation. Mild left hip degenerative arthritis. Soft tissues are. IMPRESSION: 1. Suspected nondisplaced periprosthetic greater trochanter fracture. This could be confirmed with CT imaging. Electronically Signed   By: Dorethia Molt M.D.   On: 10/05/2023 20:03   CT Head Wo Contrast Result Date: 10/05/2023 CLINICAL DATA:  fall EXAM: CT HEAD WITHOUT CONTRAST TECHNIQUE: Contiguous axial images were obtained from the base of the skull through the vertex without intravenous contrast. RADIATION DOSE  REDUCTION: This exam was performed according to the departmental dose-optimization program which includes automated exposure control, adjustment of the mA and/or kV according to patient size and/or use of iterative reconstruction technique. COMPARISON:  None Available. FINDINGS: Brain: Normal anatomic configuration. Parenchymal volume loss is commensurate with the patient's age. Mild periventricular white matter changes are present likely reflecting the sequela of small vessel ischemia. Left parietal cortical encephalomalacia related to posterior left MCA territory infarct noted. No abnormal intra or extra-axial mass lesion or fluid collection. No abnormal mass effect or midline shift. No evidence of acute intracranial hemorrhage or infarct. Ventricular size is normal. Cerebellum unremarkable. Vascular: No asymmetric hyperdense vasculature at the skull base. Skull: Intact Sinuses/Orbits: Paranasal sinuses are clear. Orbits are unremarkable. Other: Mastoid air cells and middle ear cavities are clear. IMPRESSION: 1. No acute intracranial abnormality. No calvarial fracture. 2. Left parietal cortical encephalomalacia related to remote posterior left MCA territory infarct. 3. Mild senescent change. Electronically Signed  By: Dorethia Molt M.D.   On: 10/05/2023 20:02      Subjective: Seen and examined on day of dc.  Stable, appropriate for transfer to SNF  Discharge Exam: Vitals:   10/13/23 0418 10/13/23 0813  BP: 133/74 (!) 145/74  Pulse: 75 67  Resp: 20 17  Temp: (!) 97.4 F (36.3 C) 98 F (36.7 C)  SpO2: 99% 100%   Vitals:   10/12/23 2352 10/12/23 2352 10/13/23 0418 10/13/23 0813  BP: (!) 147/81 (!) 147/81 133/74 (!) 145/74  Pulse: 78 78 75 67  Resp: 20 20 20 17   Temp: 97.9 F (36.6 C) 97.9 F (36.6 C) (!) 97.4 F (36.3 C) 98 F (36.7 C)  TempSrc: Oral Oral  Oral  SpO2: 100% 100% 99% 100%  Weight:      Height:        General: Pt is alert, awake, not in acute  distress Cardiovascular: RRR, S1/S2 +, no rubs, no gallops Respiratory: CTA bilaterally, no wheezing, no rhonchi Abdominal: Soft, NT, ND, bowel sounds + Extremities: no edema, no cyanosis    The results of significant diagnostics from this hospitalization (including imaging, microbiology, ancillary and laboratory) are listed below for reference.     Microbiology: Recent Results (from the past 240 hours)  Resp panel by RT-PCR (RSV, Flu A&B, Covid) Anterior Nasal Swab     Status: None   Collection Time: 10/08/23  1:51 PM   Specimen: Anterior Nasal Swab  Result Value Ref Range Status   SARS Coronavirus 2 by RT PCR NEGATIVE NEGATIVE Final    Comment: (NOTE) SARS-CoV-2 target nucleic acids are NOT DETECTED.  The SARS-CoV-2 RNA is generally detectable in upper respiratory specimens during the acute phase of infection. The lowest concentration of SARS-CoV-2 viral copies this assay can detect is 138 copies/mL. A negative result does not preclude SARS-Cov-2 infection and should not be used as the sole basis for treatment or other patient management decisions. A negative result may occur with  improper specimen collection/handling, submission of specimen other than nasopharyngeal swab, presence of viral mutation(s) within the areas targeted by this assay, and inadequate number of viral copies(<138 copies/mL). A negative result must be combined with clinical observations, patient history, and epidemiological information. The expected result is Negative.  Fact Sheet for Patients:  BloggerCourse.com  Fact Sheet for Healthcare Providers:  SeriousBroker.it  This test is no t yet approved or cleared by the [place]  FDA and  has been authorized for detection and/or diagnosis of SARS-CoV-2 by FDA under an Emergency Use Authorization (EUA). This EUA will remain  in effect (meaning this test can be used) for the duration of the COVID-19  declaration under Section 564(b)(1) of the Act, 21 U.S.C.section 360bbb-3(b)(1), unless the authorization is terminated  or revoked sooner.       Influenza A by PCR NEGATIVE NEGATIVE Final   Influenza B by PCR NEGATIVE NEGATIVE Final    Comment: (NOTE) The Xpert Xpress SARS-CoV-2/FLU/RSV plus assay is intended as an aid in the diagnosis of influenza from Nasopharyngeal swab specimens and should not be used as a sole basis for treatment. Nasal washings and aspirates are unacceptable for Xpert Xpress SARS-CoV-2/FLU/RSV testing.  Fact Sheet for Patients: BloggerCourse.com  Fact Sheet for Healthcare Providers: SeriousBroker.it  This test is not yet approved or cleared by the [place]  FDA and has been authorized for detection and/or diagnosis of SARS-CoV-2 by FDA under an Emergency Use Authorization (EUA). This EUA will remain in effect (meaning this test  can be used) for the duration of the COVID-19 declaration under Section 564(b)(1) of the Act, 21 U.S.C. section 360bbb-3(b)(1), unless the authorization is terminated or revoked.     Resp Syncytial Virus by PCR NEGATIVE NEGATIVE Final    Comment: (NOTE) Fact Sheet for Patients: BloggerCourse.com  Fact Sheet for Healthcare Providers: SeriousBroker.it  This test is not yet approved or cleared by the [place]  FDA and has been authorized for detection and/or diagnosis of SARS-CoV-2 by FDA under an Emergency Use Authorization (EUA). This EUA will remain in effect (meaning this test can be used) for the duration of the COVID-19 declaration under Section 564(b)(1) of the Act, 21 U.S.C. section 360bbb-3(b)(1), unless the authorization is terminated or revoked.  Performed at [facility], [address] [place], [place] [address]      Labs: BNP (last 3 results) No results for input(s): BNP in the last 8760  hours. Basic Metabolic Panel: Recent Labs  Lab 10/08/23 0552 10/09/23 0428 10/10/23 0503 10/12/23 0609  NA 140 140 140 137  K 3.8 3.7 3.9 4.0  CL 110 107 108 102  CO2 24 23 25 26   GLUCOSE 97 84 80 78  BUN 16 17 17 23   CREATININE 1.10 0.99 1.03 0.97  CALCIUM  8.9 8.9 9.5 9.5   Liver Function Tests: No results for input(s): AST, ALT, ALKPHOS, BILITOT, PROT, ALBUMIN in the last 168 hours. No results for input(s): LIPASE, AMYLASE in the last 168 hours. No results for input(s): AMMONIA in the last 168 hours. CBC: Recent Labs  Lab 10/09/23 0428 10/10/23 0503 10/12/23 0609  WBC 6.2 8.0 6.1  HGB 9.7* 10.3* 9.9*  HCT 31.1* 31.9* 30.4*  MCV 85.7 83.7 83.3  PLT 208 216 198   Cardiac Enzymes: No results for input(s): CKTOTAL, CKMB, CKMBINDEX, TROPONINI in the last 168 hours. BNP: Invalid input(s): POCBNP CBG: Recent Labs  Lab 10/11/23 1933  GLUCAP 115*   D-Dimer No results for input(s): DDIMER in the last 72 hours. Hgb A1c No results for input(s): HGBA1C in the last 72 hours. Lipid Profile No results for input(s): CHOL, HDL, LDLCALC, TRIG, CHOLHDL, LDLDIRECT in the last 72 hours. Thyroid function studies No results for input(s): TSH, T4TOTAL, T3FREE, THYROIDAB in the last 72 hours.  Invalid input(s): FREET3 Anemia work up No results for input(s): VITAMINB12, FOLATE, FERRITIN, TIBC, IRON , RETICCTPCT in the last 72 hours. Urinalysis    Component Value Date/Time   COLORURINE STRAW (A) 08/26/2023 0857   APPEARANCEUR CLEAR (A) 08/26/2023 0857   LABSPEC 1.025 08/26/2023 0857   PHURINE 6.0 08/26/2023 0857   GLUCOSEU NEGATIVE 08/26/2023 0857   HGBUR NEGATIVE 08/26/2023 0857   BILIRUBINUR NEGATIVE 08/26/2023 0857   KETONESUR NEGATIVE 08/26/2023 0857   PROTEINUR NEGATIVE 08/26/2023 0857   NITRITE NEGATIVE 08/26/2023 0857   LEUKOCYTESUR NEGATIVE 08/26/2023 0857   Sepsis Labs Recent Labs  Lab  10/09/23 0428 10/10/23 0503 10/12/23 0609  WBC 6.2 8.0 6.1   Microbiology Recent Results (from the past 240 hours)  Resp panel by RT-PCR (RSV, Flu A&B, Covid) Anterior Nasal Swab     Status: None   Collection Time: 10/08/23  1:51 PM   Specimen: Anterior Nasal Swab  Result Value Ref Range Status   SARS Coronavirus 2 by RT PCR NEGATIVE NEGATIVE Final    Comment: (NOTE) SARS-CoV-2 target nucleic acids are NOT DETECTED.  The SARS-CoV-2 RNA is generally detectable in upper respiratory specimens during the acute phase of infection. The lowest concentration of SARS-CoV-2 viral copies this  assay can detect is 138 copies/mL. A negative result does not preclude SARS-Cov-2 infection and should not be used as the sole basis for treatment or other patient management decisions. A negative result may occur with  improper specimen collection/handling, submission of specimen other than nasopharyngeal swab, presence of viral mutation(s) within the areas targeted by this assay, and inadequate number of viral copies(<138 copies/mL). A negative result must be combined with clinical observations, patient history, and epidemiological information. The expected result is Negative.  Fact Sheet for Patients:  BloggerCourse.com  Fact Sheet for Healthcare Providers:  SeriousBroker.it  This test is no t yet approved or cleared by the [place]  FDA and  has been authorized for detection and/or diagnosis of SARS-CoV-2 by FDA under an Emergency Use Authorization (EUA). This EUA will remain  in effect (meaning this test can be used) for the duration of the COVID-19 declaration under Section 564(b)(1) of the Act, 21 U.S.C.section 360bbb-3(b)(1), unless the authorization is terminated  or revoked sooner.       Influenza A by PCR NEGATIVE NEGATIVE Final   Influenza B by PCR NEGATIVE NEGATIVE Final    Comment: (NOTE) The Xpert Xpress SARS-CoV-2/FLU/RSV  plus assay is intended as an aid in the diagnosis of influenza from Nasopharyngeal swab specimens and should not be used as a sole basis for treatment. Nasal washings and aspirates are unacceptable for Xpert Xpress SARS-CoV-2/FLU/RSV testing.  Fact Sheet for Patients: BloggerCourse.com  Fact Sheet for Healthcare Providers: SeriousBroker.it  This test is not yet approved or cleared by the [place]  FDA and has been authorized for detection and/or diagnosis of SARS-CoV-2 by FDA under an Emergency Use Authorization (EUA). This EUA will remain in effect (meaning this test can be used) for the duration of the COVID-19 declaration under Section 564(b)(1) of the Act, 21 U.S.C. section 360bbb-3(b)(1), unless the authorization is terminated or revoked.     Resp Syncytial Virus by PCR NEGATIVE NEGATIVE Final    Comment: (NOTE) Fact Sheet for Patients: BloggerCourse.com  Fact Sheet for Healthcare Providers: SeriousBroker.it  This test is not yet approved or cleared by the [place]  FDA and has been authorized for detection and/or diagnosis of SARS-CoV-2 by FDA under an Emergency Use Authorization (EUA). This EUA will remain in effect (meaning this test can be used) for the duration of the COVID-19 declaration under Section 564(b)(1) of the Act, 21 U.S.C. section 360bbb-3(b)(1), unless the authorization is terminated or revoked.  Performed at [facility], [address] [place], [place] [address]      Time coordinating discharge: 40 minutes   SIGNED:   Calvin KATHEE Robson, MD  [facility] 10/13/2023, 12:08 PM Pager   If 7PM-7AM, please contact night-coverage

## 2023-10-13 NOTE — Progress Notes (Signed)
 Patient had an unwitnessed fall. Staff heard patient calling for help around 2345. When staff arrived to the room, the patient was found on the floor in the bathroom doorway on his buttocks. Around 2200 the bed alarm was going off as the patient attempted to get up without assistance. Staff helped the patient to the bathroom with a walker and was assisted back safely. Nurse aide cannot remember if the bed alarm was set at that time when she left the room. Patient was having worsening pain to the right hip previous to the fall. Staff assisted the patient to his feet and ambulated back to bed with a walker safely. Patient had a lot of pain with the right leg and struggled to put much pressure on it. Staff assisted patient comfortably back to bed and bed alarm was set to the middle sensitivity. Dr. Lawence was made aware and new orders were put in. Right hip xray showed no new fracture. Sister betty slade was called and made aware of the fall and will be updated. Nursing supervisor was made aware of the fall. All staff made aware that bed alarm must be set on this patient. Will continue to monitor.

## 2023-10-13 NOTE — Plan of Care (Signed)
  Problem: Clinical Measurements: Goal: Ability to maintain clinical measurements within normal limits will improve Outcome: Progressing Goal: Will remain free from infection Outcome: Progressing Goal: Diagnostic test results will improve Outcome: Progressing Goal: Respiratory complications will improve Outcome: Progressing Goal: Cardiovascular complication will be avoided Outcome: Progressing   Problem: Activity: Goal: Risk for activity intolerance will decrease Outcome: Progressing   Problem: Nutrition: Goal: Adequate nutrition will be maintained Outcome: Progressing   Problem: Elimination: Goal: Will not experience complications related to bowel motility Outcome: Progressing Goal: Will not experience complications related to urinary retention Outcome: Progressing   Problem: Pain Managment: Goal: General experience of comfort will improve and/or be controlled Outcome: Progressing   Problem: Skin Integrity: Goal: Risk for impaired skin integrity will decrease Outcome: Progressing   Problem: Education: Goal: Verbalization of understanding the information provided (i.e., activity precautions, restrictions, etc) will improve Outcome: Progressing Goal: Individualized Educational Video(s) Outcome: Progressing   Problem: Activity: Goal: Ability to ambulate and perform ADLs will improve Outcome: Progressing   Problem: Clinical Measurements: Goal: Postoperative complications will be avoided or minimized Outcome: Progressing   Problem: Self-Concept: Goal: Ability to maintain and perform role responsibilities to the fullest extent possible will improve Outcome: Progressing   Problem: Pain Management: Goal: Pain level will decrease Outcome: Progressing

## 2023-10-13 NOTE — TOC Transition Note (Addendum)
 Transition of Care [facility]) - Discharge Note   Patient Details  Name: John Spears MRN: [id number] Date of Birth: August 20, 1950  Transition of Care [facility]) CM/SW Contact:  Alvaro Louder, LCSW Phone Number: 10/13/2023, 2:48 PM   Clinical Narrative:   LCSWA received insurance approval for patient to admit to [facility]. LCSWA confirmed with MD that patient is stable for discharge. LCSWA notified the patient and they are in agreement with discharge. LCSWA confirmed bed is available at SNF. Transport arranged with [facility] for next available.  RM: 3A Number to call report [phone]   [facility] Signing off  Final next level of care: Skilled Nursing Facility Barriers to Discharge: No Barriers Identified   Patient Goals and CMS Choice            Discharge Placement                Patient to be transferred to facility by: [facility] Name of family member notified: D[place] Patient and family notified of of transfer: 10/13/23  Discharge Plan and Services Additional resources added to the After Visit Summary for                                       Social Drivers of Health (SDOH) Interventions SDOH Screenings   Food Insecurity: No Food Insecurity (10/06/2023)  Housing: Low Risk  (10/06/2023)  Transportation Needs: No Transportation Needs (10/06/2023)  Utilities: Not At Risk (10/06/2023)  Financial Resource Strain: Medium Risk (04/06/2023)   Received from [facility]  Social Connections: Moderately Isolated (10/06/2023)  Tobacco Use: Medium Risk (10/05/2023)     Readmission Risk Interventions     No data to display

## 2023-10-13 NOTE — Progress Notes (Signed)
 10/12/23 2352  What Happened  Was fall witnessed? No  Was patient injured? Yes  Patient found in bathroom;on floor  Found by Staff-comment (pt called out help and staff ran to the room)  Stated prior activity ambulating-unassisted  Provider Notification  Provider Name/Title Dr. Lawence  Date Provider Notified 10/12/23  Time Provider Notified 2352  Method of Notification Page  Notification Reason Other (Comment) (patient fell in the bathroom. Right leg pain was much worse than yesterday, before the fall, but now can barely put pressure on the right leg.)  Provider response Evaluate remotely  Date of Provider Response 10/13/23  Time of Provider Response 0006  Follow Up  Family notified Yes - comment (betty slade- sister)  Time family notified 0010  Additional tests Yes-comment (see new orders)  Simple treatment Ice  Progress note created (see row info) Yes  Adult Fall Risk Assessment  Risk Factor Category (scoring not indicated) Fall has occurred during this admission (document High fall risk)  Age 73  Fall History: Fall within 6 months prior to admission 5  Elimination; Bowel and/or Urine Incontinence 2  Elimination; Bowel and/or Urine Urgency/Frequency 2  Medications: includes PCA/Opiates, Anti-convulsants, Anti-hypertensives, Diuretics, Hypnotics, Laxatives, Sedatives, and Psychotropics 5  Patient Care Equipment 1  Mobility-Assistance 2  Mobility-Gait 2  Mobility-Sensory Deficit 0  Altered awareness of immediate physical environment 1  Impulsiveness 2  Lack of understanding of one's physical/cognitive limitations 4  Total Score 28  Patient Fall Risk Level High fall risk  Adult Fall Risk Interventions  Required Bundle Interventions *See Row Information* High fall risk - low, moderate, and high requirements implemented  Additional Interventions Use of appropriate toileting equipment (bedpan, BSC, etc.);Room near nurses station;Reorient/diversional activities with confused  patients  Fall intervention(s) refused/Patient educated regarding refusal Bed alarm;Nonskid socks;Open door if unsupervised;Yellow bracelet;Supervision while toileting/edge of bed sitting  Screening for Fall Injury Risk (To be completed on HIGH fall risk patients) - Assessing Need for Floor Mats  Risk For Fall Injury- Criteria for Floor Mats Admitted as a result of a fall;Confusion/dementia (+NuDESC, CIWA, TBI, etc.)  Will Implement Floor Mats Yes  Vitals  Temp 97.9 F (36.6 C)  Temp Source Oral  BP (!) 147/81  MAP (mmHg) 98  BP Location Left Arm  BP Method Automatic  Patient Position (if appropriate) Lying  Pulse Rate 78  Pulse Rate Source Monitor  Resp 20  Oxygen Therapy  SpO2 100 %  O2 Device Room Air  Pain Assessment  Pain Scale 0-10  Pain Score 3  Pain Type Acute pain  Pain Location Hip  Pain Orientation Right  Pain Descriptors / Indicators Sharp;Sore  Pain Frequency Constant  Pain Onset On-going  Patients Stated Pain Goal 2  Pain Intervention(s) MD notified (Comment)  PCA/Epidural/Spinal Assessment  Respiratory Pattern Regular;Unlabored  Neurological  Neuro (WDL) X  Orientation Level Oriented to person;Disoriented to place;Disoriented to time;Disoriented to situation  Cognition Unable to follow commands;Poor safety awareness;Poor judgement;Poor attention/concentration;Impulsive  Speech Clear  R Hand Grip Moderate  L Hand Grip Moderate  R Foot Dorsiflexion Weak  L Foot Dorsiflexion Moderate  R Foot Plantar Flexion Weak  L Foot Plantar Flexion Moderate  RLE Motor Response Purposeful movement  RLE Motor Strength 4  Neuro Symptoms Forgetful;Tremors  Neuro symptoms relieved by Rest;Relaxation techniques (Comment)  Glasgow Coma Scale  Eye Opening 4  Best Verbal Response (NON-intubated) 4  Best Motor Response 6  Glasgow Coma Scale Score 14  Musculoskeletal  Musculoskeletal (WDL) X  Assistive Device  Front wheel walker  Generalized Weakness Yes  Weight Bearing  Restrictions Per Provider Order Yes  RLE Weight Bearing Per Provider Order WBAT  Musculoskeletal Details  RUE Full movement  LUE Full movement  RLE Limited movement;Injury/trauma;Weakness (worsening)  LLE Full movement  Integumentary  Integumentary (WDL) X  Skin Color Appropriate for ethnicity  Skin Condition Dry  Skin Integrity Abrasion  Abrasion Location Hand  Abrasion Location Orientation Right  Abrasion Intervention Other (Comment) (open to air)  Skin Turgor Non-tenting  Pain Assessment  Date Pain First Started 10/06/23  Result of Injury No  Pain Assessment  Work-Related Injury No

## 2023-10-13 NOTE — TOC Progression Note (Signed)
 Transition of Care [facility]) - Progression Note    Patient Details  Name: John Spears MRN: [id number] Date of Birth: 28-Jan-1951  [facility] CM/SW Contact  Johnsie Moscoso  [place], [place] Phone Number: 10/13/2023, 8:51 AM  Clinical Narrative: SNF Auth approved for this patient. LCSWA awaiting medical readiness.  Plan Auth ID: [id number], Jluy ID: [id number] 10/12/2023-10/14/2023  TOC to follow for discharge                    Expected Discharge Plan and Services         Expected Discharge Date: 10/12/23                                     Social Drivers of Health (SDOH) Interventions SDOH Screenings   Food Insecurity: No Food Insecurity (10/06/2023)  Housing: Low Risk  (10/06/2023)  Transportation Needs: No Transportation Needs (10/06/2023)  Utilities: Not At Risk (10/06/2023)  Financial Resource Strain: Medium Risk (04/06/2023)   Received from [facility]  Social Connections: Moderately Isolated (10/06/2023)  Tobacco Use: Medium Risk (10/05/2023)    Readmission Risk Interventions     No data to display

## 2023-10-21 ENCOUNTER — Other Ambulatory Visit: Payer: Self-pay | Admitting: Gastroenterology

## 2023-10-21 DIAGNOSIS — R933 Abnormal findings on diagnostic imaging of other parts of digestive tract: Secondary | ICD-10-CM

## 2023-11-17 ENCOUNTER — Other Ambulatory Visit: Payer: Self-pay | Admitting: Cardiology

## 2023-11-17 ENCOUNTER — Other Ambulatory Visit: Payer: Self-pay | Admitting: Nurse Practitioner

## 2023-11-17 NOTE — Telephone Encounter (Signed)
 Prescription refill request for Xarelto  received.  Indication: PAF Last office visit: 07/05/23  GORMAN Lunch NP Weight: 89kg Age: 73 Scr: 0.97 on 10/12/23  Epic CrCl: 87.93  Based on above findings Xarelto  20mg  daily is the appropriate dose.  Refill approved.

## 2023-11-30 ENCOUNTER — Other Ambulatory Visit: Payer: Self-pay

## 2023-11-30 ENCOUNTER — Emergency Department
Admission: EM | Admit: 2023-11-30 | Discharge: 2023-12-01 | Disposition: A | Discharge status: Home / Self Care | Attending: Emergency Medicine | Admitting: Emergency Medicine

## 2023-11-30 ENCOUNTER — Emergency Department

## 2023-11-30 DIAGNOSIS — R258 Other abnormal involuntary movements: Secondary | ICD-10-CM | POA: Diagnosis not present

## 2023-11-30 DIAGNOSIS — S59901A Unspecified injury of right elbow, initial encounter: Secondary | ICD-10-CM | POA: Diagnosis present

## 2023-11-30 DIAGNOSIS — S60511A Abrasion of right hand, initial encounter: Secondary | ICD-10-CM | POA: Insufficient documentation

## 2023-11-30 DIAGNOSIS — S50311A Abrasion of right elbow, initial encounter: Secondary | ICD-10-CM | POA: Diagnosis not present

## 2023-11-30 DIAGNOSIS — W19XXXA Unspecified fall, initial encounter: Secondary | ICD-10-CM | POA: Insufficient documentation

## 2023-11-30 DIAGNOSIS — S60512A Abrasion of left hand, initial encounter: Secondary | ICD-10-CM | POA: Insufficient documentation

## 2023-11-30 DIAGNOSIS — R569 Unspecified convulsions: Secondary | ICD-10-CM

## 2023-11-30 LAB — CBC WITH DIFFERENTIAL/PLATELET
Abs Immature Granulocytes: 0.04 K/uL (ref 0.00–0.07)
Basophils Absolute: 0 K/uL (ref 0.0–0.1)
Basophils Relative: 0 %
Eosinophils Absolute: 0.1 K/uL (ref 0.0–0.5)
Eosinophils Relative: 1 %
HCT: 33.5 % — ABNORMAL LOW (ref 39.0–52.0)
Hemoglobin: 10.7 g/dL — ABNORMAL LOW (ref 13.0–17.0)
Immature Granulocytes: 0 %
Lymphocytes Relative: 10 %
Lymphs Abs: 0.9 K/uL (ref 0.7–4.0)
MCH: 27 pg (ref 26.0–34.0)
MCHC: 31.9 g/dL (ref 30.0–36.0)
MCV: 84.6 fL (ref 80.0–100.0)
Monocytes Absolute: 0.4 K/uL (ref 0.1–1.0)
Monocytes Relative: 4 %
Neutro Abs: 7.8 K/uL — ABNORMAL HIGH (ref 1.7–7.7)
Neutrophils Relative %: 85 %
Platelets: 195 K/uL (ref 150–400)
RBC: 3.96 MIL/uL — ABNORMAL LOW (ref 4.22–5.81)
RDW: 18.6 % — ABNORMAL HIGH (ref 11.5–15.5)
WBC: 9.3 K/uL (ref 4.0–10.5)
nRBC: 0 % (ref 0.0–0.2)

## 2023-11-30 LAB — BASIC METABOLIC PANEL WITH GFR
Anion gap: 8 (ref 5–15)
BUN: 23 mg/dL (ref 8–23)
CO2: 22 mmol/L (ref 22–32)
Calcium: 8.8 mg/dL — ABNORMAL LOW (ref 8.9–10.3)
Chloride: 105 mmol/L (ref 98–111)
Creatinine, Ser: 1.13 mg/dL (ref 0.61–1.24)
GFR, Estimated: >60 mL/min (ref >60)
Glucose, Bld: 116 mg/dL — ABNORMAL HIGH (ref 70–99)
Potassium: 4.5 mmol/L (ref 3.5–5.1)
Sodium: 135 mmol/L (ref 135–145)

## 2023-11-30 LAB — CBG MONITORING, ED: Glucose-Capillary: 109 mg/dL — ABNORMAL HIGH (ref 70–99)

## 2023-11-30 NOTE — ED Provider Notes (Signed)
 [facility] Provider Note    Event Date/Time   First MD Initiated Contact with Patient 11/30/23 2301     (approximate)   History   Fall and Seizures   HPI  John Spears is a 73 y.o. male  who presents to the emergency department today because of concern for a fall and possible seizures. Patient states that he feel earlier today. Primarily concerned for injury to his right elbow. Cannot remember if he hit his head. Reported seizures but patient is unsure if he had a seizure or not. Says he has been taking his medication. Denies any recent illness, denies any fevers, nausea or vomiting.     Physical Exam   Triage Vital Signs: ED Triage Vitals  Encounter Vitals Group     BP 11/30/23 2252 (!) 145/62     Girls Systolic BP Percentile --      Girls Diastolic BP Percentile --      Boys Systolic BP Percentile --      Boys Diastolic BP Percentile --      Pulse Rate 11/30/23 2240 66     Resp 11/30/23 2240 (!) 23     Temp 11/30/23 2240 98 F (36.7 C)     Temp Source 11/30/23 2240 Oral     SpO2 11/30/23 2240 95 %     Weight 11/30/23 2248 170 lb 3.2 oz (77.2 kg)     Height 11/30/23 2248 5' 11 (1.803 m)     Head Circumference --      Peak Flow --      Pain Score 11/30/23 2248 5     Pain Loc --      Pain Education --      Exclude from Growth Chart --     Most recent vital signs: Vitals:   11/30/23 2240 11/30/23 2252  BP:  (!) 145/62  Pulse: 66   Resp: (!) 23   Temp: 98 F (36.7 C)   SpO2: 95%    General: Awake, alert, oriented. CV:  Good peripheral perfusion. Regular rate and rhythm. Resp:  Normal effort. Lungs clear. Abd:  No distention.  Other:  Abrasion to right elbow and hands.    ED Results / Procedures / Treatments   Labs (all labs ordered are listed, but only abnormal results are displayed) Labs Reviewed  CBG MONITORING, ED - Abnormal; Notable for the following components:      Result Value   Glucose-Capillary 109 (*)    All  other components within normal limits  CBC WITH DIFFERENTIAL/PLATELET  BASIC METABOLIC PANEL WITH GFR  URINALYSIS, ROUTINE W REFLEX MICROSCOPIC     EKG  I, Guadalupe Eagles, attending physician, personally viewed and interpreted this EKG  EKG Time: 2254 Rate: ~65 Rhythm: sinus rhythm Axis: left axis deviation Intervals: qtc normal QRS: RBBB, LAFB ST changes: no st elevation Impression: abnormal ekg    RADIOLOGY I independently interpreted and visualized the right elbow. My interpretation: No fracture Radiology interpretation:  IMPRESSION:  No acute abnormality noted.      PROCEDURES:  Critical Care performed: Yes  CRITICAL CARE Performed by: Guadalupe Eagles   Total critical care time: *** minutes  Critical care time was exclusive of separately billable procedures and treating other patients.  Critical care was necessary to treat or prevent imminent or life-threatening deterioration.  Critical care was time spent personally by me on the following activities: development of treatment plan with patient and/or surrogate as well as nursing, discussions  with consultants, evaluation of patient's response to treatment, examination of patient, obtaining history from patient or surrogate, ordering and performing treatments and interventions, ordering and review of laboratory studies, ordering and review of radiographic studies, pulse oximetry and re-evaluation of patient's condition.   Procedures    MEDICATIONS ORDERED IN ED: Medications - No data to display   IMPRESSION / MDM / ASSESSMENT AND PLAN / ED COURSE  I reviewed the triage vital signs and the nursing notes.                              Differential diagnosis includes, but is not limited to, ***  Patient's presentation is most consistent with {EM COPA:27473}   ***The patient is on the cardiac monitor to evaluate for evidence of arrhythmia and/or significant heart rate changes.  ***      FINAL  CLINICAL IMPRESSION(S) / ED DIAGNOSES   Final diagnoses:  None        Rx / DC Orders   ED Discharge Orders     None        Note:  This document was prepared using Dragon voice recognition software and may include unintentional dictation errors.

## 2023-11-30 NOTE — ED Provider Notes (Incomplete)
 [facility] Provider Note    Event Date/Time   First MD Initiated Contact with Patient 11/30/23 2301     (approximate)   History   Fall and Seizures   HPI  John Spears is a 73 y.o. male  who presents to the emergency department today because of concern for a fall and possible seizures. Patient states that he feel earlier today. Primarily concerned for injury to his right elbow. Cannot remember if he hit his head. Reported seizures but patient is unsure if he had a seizure or not. Says he has been taking his medication. Denies any recent illness, denies any fevers, nausea or vomiting.     Physical Exam   Triage Vital Signs: ED Triage Vitals  Encounter Vitals Group     BP 11/30/23 2252 (!) 145/62     Girls Systolic BP Percentile --      Girls Diastolic BP Percentile --      Boys Systolic BP Percentile --      Boys Diastolic BP Percentile --      Pulse Rate 11/30/23 2240 66     Resp 11/30/23 2240 (!) 23     Temp 11/30/23 2240 98 F (36.7 C)     Temp Source 11/30/23 2240 Oral     SpO2 11/30/23 2240 95 %     Weight 11/30/23 2248 170 lb 3.2 oz (77.2 kg)     Height 11/30/23 2248 5' 11 (1.803 m)     Head Circumference --      Peak Flow --      Pain Score 11/30/23 2248 5     Pain Loc --      Pain Education --      Exclude from Growth Chart --     Most recent vital signs: Vitals:   11/30/23 2240 11/30/23 2252  BP:  (!) 145/62  Pulse: 66   Resp: (!) 23   Temp: 98 F (36.7 C)   SpO2: 95%    General: Awake, alert, oriented. CV:  Good peripheral perfusion. Regular rate and rhythm. Resp:  Normal effort. Lungs clear. Abd:  No distention.  Other:  Abrasion to right elbow and hands.    ED Results / Procedures / Treatments   Labs (all labs ordered are listed, but only abnormal results are displayed) Labs Reviewed  CBG MONITORING, ED - Abnormal; Notable for the following components:      Result Value   Glucose-Capillary 109 (*)    All  other components within normal limits  CBC WITH DIFFERENTIAL/PLATELET  BASIC METABOLIC PANEL WITH GFR  URINALYSIS, ROUTINE W REFLEX MICROSCOPIC     EKG  I, Guadalupe Eagles, attending physician, personally viewed and interpreted this EKG  EKG Time: 2254 Rate: ~65 Rhythm: sinus rhythm Axis: left axis deviation Intervals: qtc normal QRS: RBBB, LAFB ST changes: no st elevation Impression: abnormal ekg    RADIOLOGY I independently interpreted and visualized the right elbow. My interpretation: No fracture Radiology interpretation:  IMPRESSION:  No acute abnormality noted.      PROCEDURES:  Critical Care performed: Yes  CRITICAL CARE Performed by: Guadalupe Eagles   Total critical care time: *** minutes  Critical care time was exclusive of separately billable procedures and treating other patients.  Critical care was necessary to treat or prevent imminent or life-threatening deterioration.  Critical care was time spent personally by me on the following activities: development of treatment plan with patient and/or surrogate as well as nursing, discussions  with consultants, evaluation of patient's response to treatment, examination of patient, obtaining history from patient or surrogate, ordering and performing treatments and interventions, ordering and review of laboratory studies, ordering and review of radiographic studies, pulse oximetry and re-evaluation of patient's condition.   Procedures    MEDICATIONS ORDERED IN ED: Medications - No data to display   IMPRESSION / MDM / ASSESSMENT AND PLAN / ED COURSE  I reviewed the triage vital signs and the nursing notes.                              Differential diagnosis includes, but is not limited to, ***  Patient's presentation is most consistent with {EM COPA:27473}   ***The patient is on the cardiac monitor to evaluate for evidence of arrhythmia and/or significant heart rate changes.  ***      FINAL  CLINICAL IMPRESSION(S) / ED DIAGNOSES   Final diagnoses:  None        Rx / DC Orders   ED Discharge Orders     None        Note:  This document was prepared using Dragon voice recognition software and may include unintentional dictation errors.

## 2023-11-30 NOTE — ED Triage Notes (Signed)
 Patient brought in by EMS from home for reported seizures and multiple falls, hx of seizures. EMS called to house earlier in the night for same, patient declined transport at that time. Pt reports no LOC, possible head impact, R elbow injury, on Xarelto . AxOx3 (disoriented to time).

## 2023-12-01 ENCOUNTER — Emergency Department

## 2023-12-01 LAB — URINALYSIS, ROUTINE W REFLEX MICROSCOPIC
Bacteria, UA: NONE SEEN
Bilirubin Urine: NEGATIVE
Glucose, UA: NEGATIVE mg/dL
Ketones, ur: 5 mg/dL — AB
Leukocytes,Ua: NEGATIVE
Nitrite: NEGATIVE
Protein, ur: NEGATIVE mg/dL
Specific Gravity, Urine: 1.012 (ref 1.005–1.030)
pH: 5 (ref 5.0–8.0)

## 2023-12-01 NOTE — ED Notes (Signed)
 RN spoke with Jeramy Dimmick ([facility]), [phone]. Results and discharge status reviewed. [facility] will be transporting patient back home.

## 2023-12-01 NOTE — ED Notes (Signed)
 All seizure precautions in place.

## 2023-12-06 DIAGNOSIS — S32030A Wedge compression fracture of third lumbar vertebra, initial encounter for closed fracture: Principal | ICD-10-CM | POA: Diagnosis present

## 2024-01-19 NOTE — Progress Notes (Signed)
 This note has been created using automated tools and reviewed for accuracy by [facility].  Chief Complaint  Patient presents with  . Right Hip - Pain  . Lower Back - Pain    Subjective  John Spears is a 73 y.o. male who presents for Pain of the Right Hip and Pain of the Lower Back HPI History of Present Illness John Spears is a 73 year old male who presents with back and hip pain following a fall.  Approximately seven weeks ago, he experienced a fall resulting in back and hip pain. He has a history of hip and pelvic fractures, initially treated at [facility] and [facility] [place]. Since the injury, he has been using a walker for mobility and can walk to the bathroom independently. He denies current pain but experiences a 'little cramp' in his hip.  Previous imaging, including an MRI of the abdomen in May and a CT of the abdomen and pelvis in June, did not show the L3 fracture. He underwent a right hip hemiarthroplasty years ago for avascular necrosis.  He reports weakness in his hip and some tenderness in the back and hip area. He is currently receiving physical therapy at home once a week. Prior to the injury, he was walking well without a walker. His sister confirms that he has experienced a significant decline in mobility since the injury.  Review of Systems  Patient Active Problem List  Diagnosis  . Acute on chronic combined systolic and diastolic CHF (congestive heart failure) (CMS/HHS-HCC)  . Alcohol abuse  . Cerebrovascular accident (CVA) (CMS/HHS-HCC)  . Chest congestion  . Chronic cough  . Dilated cardiomyopathy (CMS/HHS-HCC)  . Seizures (CMS/HHS-HCC)  . Encephalopathy, unspecified  . Essential hypertension  . HFrEF (heart failure with reduced ejection fraction) (CMS/HHS-HCC)  . Hypokalemia  . Hypomagnesemia  . Lower extremity weakness  . Thrombocytopenia ()  . Transaminitis  . Traumatic rhabdomyolysis ()  . Weakness  . Atrial fibrillation (CMS/HHS-HCC)  . Chronic diastolic  CHF (congestive heart failure) (CMS/HHS-HCC)  . CKD (chronic kidney disease), stage III (CMS-HCC)  . HLD (hyperlipidemia)  . Periprosthetic intertrochanteric fracture of femur    Outpatient Medications Prior to Visit  Medication Sig Dispense Refill  . carvediloL  (COREG ) 3.125 MG tablet Take 3.125 mg by mouth 2 (two) times daily    . folic acid  (FOLVITE ) 1 MG tablet Take 1,000 mcg by mouth once daily  0  . FUROsemide  (LASIX ) 20 MG tablet Take 20 mg by mouth once daily    . gabapentin  (NEURONTIN ) 300 MG capsule Take 300 mg by mouth every 8 (eight) hours as needed    . lacoSAMide  (VIMPAT ) 200 mg tablet Take 150 mg by mouth every 12 (twelve) hours    . levETIRAcetam  (KEPPRA ) 750 MG tablet Take 1,500 mg by mouth every 12 (twelve) hours    . multivitamin tablet Take 1 tablet by mouth once daily    . oxyCODONE -acetaminophen  (PERCOCET) 5-325 mg tablet Take 1 tablet by mouth every 4 (four) hours as needed for Pain    . polyethylene glycol (MIRALAX ) packet Take 17 g by mouth 2 (two) times daily    . rivaroxaban  (XARELTO ) 20 mg tablet Take by mouth daily with breakfast    . tamsulosin  (FLOMAX ) 0.4 mg capsule Take 0.4 mg by mouth once daily Take 30 minutes after same meal each day.    . thiamine  (VITAMIN B-1) 100 MG tablet Take 100 mg by mouth once daily  3  . carvediloL  (COREG ) 25 MG tablet  Take 12.5 mg by mouth 2 (two) times daily with meals    . cyanocobalamin  (VITAMIN B12) 1000 MCG tablet Take 1,000 mcg by mouth once daily    . ergocalciferol , vitamin D2, 1,250 mcg (50,000 unit) capsule Take 50,000 Units by mouth every 7 (seven) days    . acetaminophen  (TYLENOL ) 500 MG tablet Take 1,000 mg by mouth every 6 (six) hours as needed for Pain (Patient not taking: Reported on 01/19/2024)    . amLODIPine  (NORVASC ) 10 MG tablet Take 10 mg by mouth once daily (Patient not taking: Reported on 01/19/2024)    . aspirin  81 MG EC tablet Take 81 mg by mouth (Patient not taking: Reported on 12/20/2023)    .  atorvastatin  (LIPITOR) 40 MG tablet TAKE 1 TABLET BY MOUTH EVERY DAY FOR CHOLESTEROL (Patient not taking: Reported on 01/19/2024)  3  . carvediloL  (COREG ) 12.5 MG tablet Take 12.5 mg by mouth 2 (two) times daily with meals (Patient not taking: Reported on 01/19/2024)    . FUROsemide  (LASIX ) 40 MG tablet Take 40 mg by mouth once daily (Patient not taking: Reported on 07/07/2023)    . gabapentin  (NEURONTIN ) 100 MG capsule Take 100 mg twice daily for one week, then increase to 200 mg twice daily for shoulder and hip pain. (Patient not taking: Reported on 01/19/2024) 120 capsule 2  . levETIRAcetam  (KEPPRA ) 1000 MG tablet Take 1,000 mg by mouth 2 (two) times daily (Patient not taking: Reported on 07/07/2023)    . sacubitriL -valsartan  (ENTRESTO ) 49-51 mg tablet Take 1 tablet by mouth 2 (two) times daily (Patient not taking: Reported on 06/15/2023)    . sennosides (SENOKOT) 8.6 mg tablet Take 8.6 mg by mouth once daily (Patient not taking: Reported on 01/19/2024)    . spironolactone  (ALDACTONE ) 25 MG tablet Take 25 mg by mouth once daily (Patient not taking: Reported on 06/15/2023)    . tiZANidine (ZANAFLEX) 2 MG tablet Take 1 tablet (2 mg total) by mouth at bedtime (Patient not taking: Reported on 01/19/2024) 60 tablet 0   No facility-administered medications prior to visit.      Objective  Vitals:   01/19/24 1256  BP: 138/76  Height: 180.3 cm (5' 11)  PainSc:   8  PainLoc: Hip   Body mass index is 22.45 kg/m.  Home Vitals:     Physical Exam Physical Exam GENERAL: Alert, cooperative, well developed, no acute distress. HEENT: Normocephalic, normal oropharynx, moist mucous membranes. CHEST: Clear to auscultation bilaterally, no wheezes, rhonchi, or crackles. CARDIOVASCULAR: Normal heart rate and rhythm, S1 and S2 normal without murmurs. ABDOMEN: Soft, tender, non-distended, without organomegaly, normal bowel sounds. EXTREMITIES: No cyanosis or edema. MUSCULOSKELETAL: Tenderness in lumbar  spine and hip, weakness in hip abduction. NEUROLOGICAL: Cranial nerves grossly intact, moves all extremities without gross motor or sensory deficit.  Results RADIOLOGY Lumbar spine X-ray: L3 compression fracture, stable appearance, pedicles intact, mild facet arthritis (01/19/2024) Pelvis X-ray: Healed pubic rami fractures, SI joints intact, no pelvic opening, healed right greater trochanter fracture (01/19/2024)     Assessment/Plan:   Assessment & Plan L3 vertebral compression fracture, stable   The L3 compression fracture remains unchanged from prior imaging, with intact pedicles and no significant abnormalities aside from mild facet arthritis. The fracture is stable and has not progressed since the initial injury six weeks ago.  Healed fractures of right greater trochanter and pelvis   Current x-ray shows healed fractures of the pelvis and right greater trochanter, with intact SI joints and no pelvic opening.  The fractures are well-healed.  Weakness and impaired mobility after fractures   There is a significant drop in mobility since the initial injury. He uses a walker and experiences weakness, especially in the hip. Home health therapy once a week is insufficient. Increased therapy is needed to improve strength and reduce fall risk, as evidenced by a recent fall in the bathtub due to weakness and tremors. Prescribe outpatient physical therapy two to three times a week for one month to improve strength and mobility. Provide contact information for local outpatient therapy facilities and coordinate with family to arrange transportation for therapy sessions.  Recording duration: 13 minutes Diagnoses and all orders for this visit:  Closed nondisplaced fracture of greater trochanter of right femur with routine healing, subsequent encounter -     X-ray pelvis 1 to 2 views; Future -     Ambulatory Referral to Physical Therapy  Closed wedge compression fracture of L3 vertebra, initial  encounter (CMS/HHS-HCC) -     X-ray lumbar spine 2 to 3 views; Future -     Ambulatory Referral to Physical Therapy  Closed fracture of pubic ramus, unspecified laterality, initial encounter (CMS/HHS-HCC) -     Ambulatory Referral to Physical Therapy    This visit was coded based on medical decision making (MDM).           Future Appointments   This patient does not currently have any appointments scheduled.     There are no Patient Instructions on file for this visit.  An after visit summary was provided for the patient either in written format (printed) or through [facility].  This note has been created using automated tools and reviewed for accuracy by [facility].

## 2024-01-28 ENCOUNTER — Ambulatory Visit: Admitting: Cardiology

## 2024-01-28 NOTE — Progress Notes (Deleted)
 Cardiology Office Note   Date:  01/28/2024  ID:  Kingsley, Farace 1950/04/06, MRN [id number] PCP: Patient, No Pcp Per  [facility] Providers Cardiologist:  Lonni Hanson, MD { Click to update primary MD,subspecialty MD or APP then REFRESH:1}    History of Present Illness Luigi Stuckey is a 73 y.o. male with a past medical history of cardiomyopathy, chronic HFpEF, hypertension, seizures disorder, alcohol abuse, mixed hyperlipidemia, paroxysmal atrial fibrillation, who is here today for follow-up after recent hospitalization.   Was admitted to [facility] in December 2023 after being found unresponsive by a friend.  He was initially intubated requiring vasopressor support with concern he may have been seizures prior to arrival.  Stroke workup was unremarkable.  Echocardiogram revealed an LVEF of 35-40%, moderate asymmetric hypertrophy of the basal septum and mid/apical, anterior/anterolateral hypokinesis.  PA pressures were moderately elevated and no significant valvular disease was noted.  High-sensitivity troponin peaked at 291.  He was followed by cardiology and was started on GDMT including beta-blocker, Entresto , spironolactone , daily Lasix .  He was not felt to be a good candidate for diagnostic catheterization due to ongoing encephalopathy.  He had been treated for aspiration pneumonia and was subsequently discharged to follow-up in clinic.  Seen in clinic 03/2022, accompanied by family member, during this visit he was with his only complaint being persistent chest congestion and nonproductive cough as well as nasal congestion. He was scheduled for blood work and a limited prescription. He was also sent for chest x-ray and encouraged to use nasal spray and over-the-counter guaifenesin .  He was then seen in clinic again in April 2024 stated he had been taking his medications as prescribed.  His sister had an shortage of medication making him feel impact so he was unaware of his current  doses.  He denied any chest pain or shortness of breath.  He was continued to work on escalating GDMT although he was not a candidate for SGLT2 inhibitor due to obesity, sedentary state, history of noncompliance.  It was discussed that he continued to the need for an ischemic evaluation and did not call us  to only have an updated echocardiogram completed at that time.  He continued to drink daily and was encouraged to abstain. he was seen in clinic following, 07/2022 where he reported he had been doing well.  After having repeat echocardiogram done and received a notification that his echo was normal he was placed back on his Entresto , spironolactone , and potassium.  He also continued to decline to pursue an ischemic evaluation.  He was admitted at [facility] 09/25/2022 discharge 09/26/2022 with primary complaint of chest pain.  High-sensitivity troponins were in the 40s.  EKG revealed sinus rhythm with fascicular block.  He underwent stress testing for continued ischemic evaluation that showed no significant ischemia was considered a low risk scan.  He was considered stable and was able to be discharged home.  Unfortunately he was again admitted to the hospital 02/05/2023 for acute metabolic encephalopathy.  Patient presented to the [facility] emergency department after patient's sister found him lying on the floor, 911 she was concern of seizure activity.  She had noted he had been compliant with his medications but was unclear about his alcohol use.  EMS noted seizure activity, right tremor, leftward gaze.  He was given 5 mg of Versed  which did not break his seizure activity and subsequent 2.5 mg with abatement.  He was intubated immediately upon arrival and encouraged for code was called and he  was taken to CT with no evidence of bleed.  He was loaded with Keppra  and started on propofol /fentanyl  and admitted under the service of PCCM.  He was followed by neurology in the hospital and underwent an EEG.  Keppra  dosing was  uptitrated to 1500 mg twice daily.  He was placed on Eliquis  secondary to stroke prophylaxis for paroxysmal atrial fibrillation with a CHA2DS2-VASc score of 5 and aspirin  was discontinued to decrease the risk of bleeding with his Eliquis .  He was considered stable and discharged to skilled nursing facility on 02/10/2023.  He was again readmitted to [facility] from 1/14 - 03/16/2023 for acute metabolic encephalopathy secondary to seizures.  He had breakthrough seizures, and went up having his Keppra  increased to 1500 mg twice daily and started on Vimpat  150 mg twice daily.  He remained uneventful for 48 hours.  He was to be continued on seizure precautions and rehab was recommended.  On 1/17 he was preparing for discharge to SNF however revealed 3 short lasting generalized tonic-clonic seizures witnessed in the hospital.  He also was found to have a low-grade fever and to have a rhinovirus infection.  On 1/18 he had another episode of seizure-like activity after walking back from the bathroom.  Multiple adjustments were done to his seizure medications and he had remained stable was able to transfer to SNF on 03/16/2023.  He was last seen in clinic 07/05/2023 accompanied by family over.  He been doing well from a cardiac perspective without chest pain, shortness of breath peripheral edema or palpitations.  He did a few hospital visits.  Stated he had been compliant with his medications.  He had foot a few teeth extracted and wanted to know if he had to hold his Xarelto .  He was recently hospitalized at [facility] from 10/8 - 12/07/2023.  He presented for evaluation after a fall and found to have multiple pelvic ring fractures and incidental finding of L3 compression fracture.  He was evaluated by orthopedic surgery.  He was also evaluated for PT and OT.  His carvedilol  was decreased to 3.25 mg twice daily without reoccurrence of symptomatic bradycardia.  He was placed on a ZIO event monitor.  He was discharged to SNF.  ROS:  ***  Studies Reviewed      Lexiscan  Myoview  09/25/2022  Pharmacological myocardial perfusion imaging study with no significant  ischemia Normal wall motion, EF estimated at 67% No EKG changes concerning for ischemia at peak stress or in recovery. CT attenuation correction images with no significant coronary calcification, minimal aortic atherosclerosis Low risk scan   2D echo 06/24/2022 1. Left ventricular ejection fraction, by estimation, is 60 to 65%. The  left ventricle has normal function. There is mild left ventricular  hypertrophy. Left ventricular diastolic parameters are consistent with  Grade I diastolic dysfunction (impaired  relaxation).   2. Right ventricular systolic function is normal.   3. The mitral valve is normal in structure. Trivial mitral valve  regurgitation.   Comparison(s): Previous LVEF reported as 30-35%.    TTE 02/24/22 1. Left ventricular ejection fraction, by estimation, is 30 to 35%. The  left ventricle has moderately decreased function. The left ventricle  demonstrates regional wall motion abnormalities (see scoring  diagram/findings for description). There is  moderate asymmetric left ventricular hypertrophy of the basal-septal  segment. Left ventricular diastolic parameters are indeterminate.   2. Right ventricular systolic function is normal. The right ventricular  size is normal. There is moderately elevated pulmonary artery systolic  pressure. The estimated right ventricular systolic pressure is 52.1 mmHg.   3. The mitral valve is normal in structure. Trivial mitral valve  regurgitation.   4. The aortic valve is tricuspid. Aortic valve regurgitation is not  visualized. No aortic stenosis is present.   5. The inferior vena cava is normal in size with <50% respiratory  variability, suggesting right atrial pressure of 8 mmHg.   Risk Assessment/Calculations  CHA2DS2-VASc Score = 5  {Confirm score is correct.  If not, click here to update score.   REFRESH note.  :1} This indicates a 7.2% annual risk of stroke. The patient's score is based upon: CHF History: 1 HTN History: 1 Diabetes History: 0 Stroke History: 2 Vascular Disease History: 0 Age Score: 1 Gender Score: 0   {This patient has a significant risk of stroke if diagnosed with atrial fibrillation.  Please consider VKA or DOAC agent for anticoagulation if the bleeding risk is acceptable.   You can also use the SmartPhrase .[email or web address] for documentation.   :[id number]} No BP recorded.  {Refresh Note OR Click here to enter BP  :1}***       Physical Exam VS:  There were no vitals taken for this visit.       Wt Readings from Last 3 Encounters:  11/30/23 170 lb 3.2 oz (77.2 kg)  10/05/23 168 lb (76.2 kg)  08/26/23 165 lb 3.2 oz (74.9 kg)    GEN: Well nourished, well developed in no acute distress NECK: No JVD; No carotid bruits CARDIAC: ***RRR, no murmurs, rubs, gallops RESPIRATORY:  Clear to auscultation without rales, wheezing or rhonchi  ABDOMEN: Soft, non-tender, non-distended EXTREMITIES:  No edema; No deformity   ASSESSMENT AND PLAN Paroxysmal atrial fibrillation Chronic HFpEF Primary hypertension Mixed hyperlipidemia Trifascicular block on EKG Seizure disorder EtOH abuse    {Are you ordering a CV Procedure (e.g. stress test, cath, DCCV, TEE, etc)?   Press F2        :[id number]}  Dispo: ***  Signed, Naftali Carchi, NP

## 2024-01-31 ENCOUNTER — Inpatient Hospital Stay

## 2024-01-31 ENCOUNTER — Inpatient Hospital Stay: Admitting: Oncology

## 2024-02-03 ENCOUNTER — Emergency Department

## 2024-02-03 ENCOUNTER — Encounter: Payer: Self-pay | Admitting: Intensive Care

## 2024-02-03 ENCOUNTER — Emergency Department
Admission: EM | Admit: 2024-02-03 | Discharge: 2024-02-03 | Disposition: A | Discharge status: Home / Self Care | Attending: Emergency Medicine | Admitting: Emergency Medicine

## 2024-02-03 ENCOUNTER — Other Ambulatory Visit: Payer: Self-pay

## 2024-02-03 DIAGNOSIS — N189 Chronic kidney disease, unspecified: Secondary | ICD-10-CM | POA: Insufficient documentation

## 2024-02-03 DIAGNOSIS — I509 Heart failure, unspecified: Secondary | ICD-10-CM | POA: Insufficient documentation

## 2024-02-03 DIAGNOSIS — Z8673 Personal history of transient ischemic attack (TIA), and cerebral infarction without residual deficits: Secondary | ICD-10-CM | POA: Insufficient documentation

## 2024-02-03 DIAGNOSIS — R251 Tremor, unspecified: Secondary | ICD-10-CM | POA: Insufficient documentation

## 2024-02-03 DIAGNOSIS — W1839XA Other fall on same level, initial encounter: Secondary | ICD-10-CM | POA: Insufficient documentation

## 2024-02-03 DIAGNOSIS — R569 Unspecified convulsions: Secondary | ICD-10-CM | POA: Insufficient documentation

## 2024-02-03 DIAGNOSIS — S32592A Other specified fracture of left pubis, initial encounter for closed fracture: Secondary | ICD-10-CM | POA: Insufficient documentation

## 2024-02-03 LAB — COMPREHENSIVE METABOLIC PANEL WITH GFR
ALT: 12 U/L (ref 0–44)
ALT: 10 U/L (ref 0–44)
AST: 30 U/L (ref 15–41)
AST: 24 U/L (ref 15–41)
Albumin: 3.8 g/dL (ref 3.5–5.0)
Albumin: 3.9 g/dL (ref 3.5–5.0)
Alkaline Phosphatase: 147 U/L — ABNORMAL HIGH (ref 38–126)
Alkaline Phosphatase: 159 U/L — ABNORMAL HIGH (ref 38–126)
Anion gap: 9 (ref 5–15)
Anion gap: 13 (ref 5–15)
BUN: 11 mg/dL (ref 8–23)
BUN: 12 mg/dL (ref 8–23)
CO2: 25 mmol/L (ref 22–32)
CO2: 21 mmol/L — ABNORMAL LOW (ref 22–32)
Calcium: 9.5 mg/dL (ref 8.9–10.3)
Calcium: 9.9 mg/dL (ref 8.9–10.3)
Chloride: 107 mmol/L (ref 98–111)
Chloride: 108 mmol/L (ref 98–111)
Creatinine, Ser: 0.96 mg/dL (ref 0.61–1.24)
Creatinine, Ser: 1.02 mg/dL (ref 0.61–1.24)
GFR, Estimated: >60 mL/min (ref >60)
GFR, Estimated: >60 mL/min (ref >60)
Glucose, Bld: 104 mg/dL — ABNORMAL HIGH (ref 70–99)
Glucose, Bld: 103 mg/dL — ABNORMAL HIGH (ref 70–99)
Potassium: 4.1 mmol/L (ref 3.5–5.1)
Potassium: 3.7 mmol/L (ref 3.5–5.1)
Sodium: 141 mmol/L (ref 135–145)
Sodium: 142 mmol/L (ref 135–145)
Total Bilirubin: 0.4 mg/dL (ref 0.0–1.2)
Total Bilirubin: 0.3 mg/dL (ref 0.0–1.2)
Total Protein: 6.9 g/dL (ref 6.5–8.1)
Total Protein: 7.2 g/dL (ref 6.5–8.1)

## 2024-02-03 LAB — CBC WITH DIFFERENTIAL/PLATELET
Abs Immature Granulocytes: 0.02 K/uL (ref 0.00–0.07)
Basophils Absolute: 0 K/uL (ref 0.0–0.1)
Basophils Relative: 1 %
Eosinophils Absolute: 0.1 K/uL (ref 0.0–0.5)
Eosinophils Relative: 2 %
HCT: 32.3 % — ABNORMAL LOW (ref 39.0–52.0)
Hemoglobin: 10.1 g/dL — ABNORMAL LOW (ref 13.0–17.0)
Immature Granulocytes: 0 %
Lymphocytes Relative: 20 %
Lymphs Abs: 1.1 K/uL (ref 0.7–4.0)
MCH: 26.7 pg (ref 26.0–34.0)
MCHC: 31.3 g/dL (ref 30.0–36.0)
MCV: 85.4 fL (ref 80.0–100.0)
Monocytes Absolute: 0.5 K/uL (ref 0.1–1.0)
Monocytes Relative: 9 %
Neutro Abs: 3.8 K/uL (ref 1.7–7.7)
Neutrophils Relative %: 68 %
Platelets: 213 K/uL (ref 150–400)
RBC: 3.78 MIL/uL — ABNORMAL LOW (ref 4.22–5.81)
RDW: 16.6 % — ABNORMAL HIGH (ref 11.5–15.5)
WBC: 5.6 K/uL (ref 4.0–10.5)
nRBC: 0 % (ref 0.0–0.2)

## 2024-02-03 LAB — URINALYSIS, ROUTINE W REFLEX MICROSCOPIC
Bilirubin Urine: NEGATIVE
Glucose, UA: NEGATIVE mg/dL
Ketones, ur: NEGATIVE mg/dL
Leukocytes,Ua: NEGATIVE
Nitrite: NEGATIVE
Protein, ur: 100 mg/dL — AB
Specific Gravity, Urine: 1.016 (ref 1.005–1.030)
Squamous Epithelial / HPF: 0 /HPF (ref 0–5)
pH: 5 (ref 5.0–8.0)

## 2024-02-03 LAB — CBG MONITORING, ED: Glucose-Capillary: 105 mg/dL — ABNORMAL HIGH (ref 70–99)

## 2024-02-03 LAB — CBC
HCT: 34 % — ABNORMAL LOW (ref 39.0–52.0)
Hemoglobin: 10.6 g/dL — ABNORMAL LOW (ref 13.0–17.0)
MCH: 26.7 pg (ref 26.0–34.0)
MCHC: 31.2 g/dL (ref 30.0–36.0)
MCV: 85.6 fL (ref 80.0–100.0)
Platelets: 218 K/uL (ref 150–400)
RBC: 3.97 MIL/uL — ABNORMAL LOW (ref 4.22–5.81)
RDW: 16.6 % — ABNORMAL HIGH (ref 11.5–15.5)
WBC: 6.9 K/uL (ref 4.0–10.5)
nRBC: 0 % (ref 0.0–0.2)

## 2024-02-03 LAB — TROPONIN T, HIGH SENSITIVITY
Troponin T High Sensitivity: 19 ng/L (ref 0–19)
Troponin T High Sensitivity: 19 ng/L (ref 0–19)

## 2024-02-03 NOTE — ED Provider Notes (Signed)
 [facility] Provider Note   Event Date/Time   First MD Initiated Contact with Patient 02/03/24 1641     (approximate) History  Tremors  HPI John Spears is a 73 y.o. male with a past medical history of CVA resting tremor, seizures, CKD, dilated cardiomyopathy, and heart failure who presents after an episode of severe tremors.  Patient and wife at bedside providing this history stating that patient has had 1 episode per day over the last 2 days of increased shaking activity in bilateral upper extremities without any altered mental status, loss of consciousness, or disorientation per the patient.  Patient states these last approximately 10 minutes and resolved spontaneously.  Patient denies any medication nonadherence.  Patient denies any seizure activity similar to this in the past. ROS: Patient currently denies any vision changes, tinnitus, difficulty speaking, facial droop, sore throat, chest pain, shortness of breath, abdominal pain, nausea/vomiting/diarrhea, dysuria, or weakness/numbness/paresthesias in any extremity   Physical Exam  Triage Vital Signs: ED Triage Vitals  Encounter Vitals Group     BP 02/03/24 1316 136/84     Girls Systolic BP Percentile --      Girls Diastolic BP Percentile --      Boys Systolic BP Percentile --      Boys Diastolic BP Percentile --      Pulse Rate 02/03/24 1316 65     Resp 02/03/24 1316 16     Temp 02/03/24 1316 98 F (36.7 C)     Temp Source 02/03/24 1316 Oral     SpO2 02/03/24 1316 100 %     Weight 02/03/24 1313 170 lb 3.1 oz (77.2 kg)     Height 02/03/24 1313 5' 11 (1.803 m)     Head Circumference --      Peak Flow --      Pain Score --      Pain Loc --      Pain Education --      Exclude from Growth Chart --    Most recent vital signs: Vitals:   02/03/24 1316 02/03/24 1634  BP: 136/84 (!) 167/91  Pulse: 65 73  Resp: 16 18  Temp: 98 F (36.7 C) 98.1 F (36.7 C)  SpO2: 100% 99%   General: Awake,  oriented x2. CV:  Good peripheral perfusion. Resp:  Normal effort. Abd:  No distention. Other:  Elderly well-developed, well-nourished African-American male resting comfortably in no acute distress.  Resting tremor in bilateral upper and lower extremities ED Results / Procedures / Treatments  Labs (all labs ordered are listed, but only abnormal results are displayed) Labs Reviewed  CBC WITH DIFFERENTIAL/PLATELET - Abnormal; Notable for the following components:      Result Value   RBC 3.78 (*)    Hemoglobin 10.1 (*)    HCT 32.3 (*)    RDW 16.6 (*)    All other components within normal limits  COMPREHENSIVE METABOLIC PANEL WITH GFR - Abnormal; Notable for the following components:   Glucose, Bld 104 (*)    Alkaline Phosphatase 147 (*)    All other components within normal limits   RADIOLOGY ED MD interpretation: CT of the head without contrast interpreted by me shows no evidence of acute abnormalities including no intracerebral hemorrhage, obvious masses, or significant edema - All radiology independently interpreted and agree with radiology assessment Official radiology report(s): CT Head Wo Contrast Result Date: 02/03/2024 CLINICAL DATA:  Tremors worsening today.  Recent fall. EXAM: CT HEAD WITHOUT CONTRAST TECHNIQUE:  Contiguous axial images were obtained from the base of the skull through the vertex without intravenous contrast. RADIATION DOSE REDUCTION: This exam was performed according to the departmental dose-optimization program which includes automated exposure control, adjustment of the mA and/or kV according to patient size and/or use of iterative reconstruction technique. COMPARISON:  12/01/2023 FINDINGS: Brain: Ventricles and cisterns are normal. CSF spaces are unremarkable. Evidence of chronic ischemic microvascular disease. Old left posterior parietal/occipital watershed infarct. No mass, mass effect, shift of midline structures or acute hemorrhage. Vascular: No hyperdense  vessel or unexpected calcification. Skull: Normal. Negative for fracture or focal lesion. Sinuses/Orbits: Orbits are normal symmetric. Hypoplastic frontal sinuses. Minimal opacification over the ethmoid air cells. Mastoid air cells are clear. Other: None. IMPRESSION: 1. No acute findings. 2. Chronic ischemic microvascular disease and old left posterior parietal/occipital watershed infarct. Electronically Signed   By: Toribio Agreste M.D.   On: 02/03/2024 15:28   PROCEDURES: Critical Care performed: No Procedures MEDICATIONS ORDERED IN ED: Medications - No data to display IMPRESSION / MDM / ASSESSMENT AND PLAN / ED COURSE  I reviewed the triage vital signs and the nursing notes.                             The patient is on the cardiac monitor to evaluate for evidence of arrhythmia and/or significant heart rate changes. Patient's presentation is most consistent with acute presentation with potential threat to life or bodily function. Patient is a 74 year old male who presents for increasing tremor activity that has occurred over 2 separate incidences over the last 2 days. DDx: CVA, breakthrough seizures, tremor, parkinsonism Plan: CBC, CMP, head CT  Radiologic and laboratory evaluation including neurologic exam does not show any evidence of acute abnormalities.  Patient has a resting tremor that has been documented on multiple occasions over the last year as baseline for the patient.  I discussed with patient's family at bedside the importance of following up with neurology for further management of these tremors and possible breakthrough seizures.  Patient and family expressed understanding and all questions were answered prior to discharge.  Patient and family were also given strict return precautions  Dispo: Discharge home with neurology follow-up   FINAL CLINICAL IMPRESSION(S) / ED DIAGNOSES   Final diagnoses:  Tremor  Seizure-like activity (HCC)   Rx / DC Orders   ED Discharge Orders      None      Note:  This document was prepared using Dragon voice recognition software and may include unintentional dictation errors.   Meegan Shanafelt K, MD 02/03/24 [phone]

## 2024-02-03 NOTE — ED Notes (Signed)
 Pt assisted to use urinal w/ stand-by assist x 2... Urine sample collected and sent to lab. Pt back on stretcher, warm blanket provided; bed alarm on. Fall precautions in place.

## 2024-02-03 NOTE — ED Triage Notes (Signed)
 Brought in by EMS from physical therapy for tremors X2 days that worsened today. Patient c/o right head pain from fall two weeks ago and EMS reported right shoulder and right hip pain.    Family not present at this time. Alert to self only in triage. Unsure of baseline

## 2024-02-03 NOTE — ED Triage Notes (Signed)
 First Nurse Note;  Pt via [facility] from physical therapy. Pt c/o tremors for the past 2 days, reports it got worse today. Also report R shoulder pain and R hip pain, which he is in PT for. Pt is A&Ox2 and NAD per EMS EMS reports 181/82 BP, 71 HR, 98% on RA, 86 CBG, 97.4, 20G L AC

## 2024-02-03 NOTE — ED Triage Notes (Signed)
 Pt arrives EMS who reports pt was found on floor by wife after she heard him fall, denies hitting head or LOC. She reports to EMS that pt had tremors prior to their arrival but none that they noted. Pt was seen here earlier today for for tremors and discharged, per ems wife is unable to take of pt.  Pt denies falling tonight, states he fell a couple weeks ago. Denies pain anywhere. Pt is alert to self and place, unsure of baseline.

## 2024-02-03 NOTE — Discharge Instructions (Addendum)
 Please follow-up with your neurologist, Dr. Lane, for further management of your neurologic symptoms

## 2024-02-04 ENCOUNTER — Emergency Department

## 2024-02-04 ENCOUNTER — Emergency Department
Admission: EM | Admit: 2024-02-04 | Discharge: 2024-02-04 | Disposition: A | Discharge status: Home / Self Care | Attending: Emergency Medicine | Admitting: Emergency Medicine

## 2024-02-04 DIAGNOSIS — S32592A Other specified fracture of left pubis, initial encounter for closed fracture: Secondary | ICD-10-CM

## 2024-02-04 DIAGNOSIS — W19XXXA Unspecified fall, initial encounter: Secondary | ICD-10-CM

## 2024-02-04 MED ORDER — ACETAMINOPHEN 500 MG PO TABS
1000.0000 mg | ORAL_TABLET | Freq: Once | ORAL | Status: AC
  Administered 2024-02-04: 1000 mg via ORAL
  Filled 2024-02-04: qty 2

## 2024-02-04 NOTE — Discharge Instructions (Signed)
 Use Tylenol  for pain and fevers.  Up to 1000 mg per dose, up to 4 times per day.  Do not take more than 4000 mg of Tylenol /acetaminophen  within 24 hours..  Small fracture on the left side of his pelvis - does not require any sort of surgery.  He can walk around as normal

## 2024-02-04 NOTE — ED Notes (Signed)
 This RN called pt's sister to get an update on when she would be here to pick up pt. Pt's sister stated she would be here in the next 30-40 minutes.

## 2024-02-04 NOTE — ED Provider Notes (Signed)
 [facility] Provider Note    Event Date/Time   First MD Initiated Contact with Patient 02/04/24 0024     (approximate)   History   Fall   HPI  John Spears is a 73 y.o. male who presents to the ED for evaluation of Fall   I reviewed ED visit from a few hours ago.  History of CVA, resting tremors, seizure disorder, CKD, CHF who was seen after an episode of severe tremors.  Patient presents to the ED after a fall.  Patient cannot provide much relevant history but reports feeling fine and is uncertain why he is here.  Majority of history is provided over the phone by patient's sister, Dickey, who he lives with at home.  She reports an additional fall that occurred this evening after they got home from previous ED visit.  No concerns for syncope or seizure, she heard a thump and found some on the floor, awake and trying to get his pants up.   Physical Exam   Triage Vital Signs: ED Triage Vitals  Encounter Vitals Group     BP 02/03/24 1958 (!) 173/82     Girls Systolic BP Percentile --      Girls Diastolic BP Percentile --      Boys Systolic BP Percentile --      Boys Diastolic BP Percentile --      Pulse Rate 02/03/24 1958 75     Resp 02/03/24 1958 18     Temp 02/03/24 1958 98.5 F (36.9 C)     Temp Source 02/03/24 1958 Oral     SpO2 02/03/24 1958 100 %     Weight --      Height --      Head Circumference --      Peak Flow --      Pain Score 02/03/24 1959 0     Pain Loc --      Pain Education --      Exclude from Growth Chart --     Most recent vital signs: Vitals:   02/03/24 2233 02/04/24 0030  BP: (!) 182/75 (!) 170/90  Pulse: 76 72  Resp: 17 16  Temp: 99.1 F (37.3 C)   SpO2: 100% 99%    General: Awake, no distress.  CV:  Good peripheral perfusion.  Resp:  Normal effort.  Abd:  No distention.  MSK:  No deformity noted.  Palpation of all 4 extremities without evidence of deformity, tenderness or trauma Neuro:  No focal  deficits appreciated. Other:     ED Results / Procedures / Treatments   Labs (all labs ordered are listed, but only abnormal results are displayed) Labs Reviewed  COMPREHENSIVE METABOLIC PANEL WITH GFR - Abnormal; Notable for the following components:      Result Value   CO2 21 (*)    Glucose, Bld 103 (*)    Alkaline Phosphatase 159 (*)    All other components within normal limits  CBC - Abnormal; Notable for the following components:   RBC 3.97 (*)    Hemoglobin 10.6 (*)    HCT 34.0 (*)    RDW 16.6 (*)    All other components within normal limits  URINALYSIS, ROUTINE W REFLEX MICROSCOPIC - Abnormal; Notable for the following components:   Color, Urine YELLOW (*)    APPearance CLEAR (*)    Hgb urine dipstick SMALL (*)    Protein, ur 100 (*)    Bacteria, UA RARE (*)  All other components within normal limits  CBG MONITORING, ED - Abnormal; Notable for the following components:   Glucose-Capillary 105 (*)    All other components within normal limits  TROPONIN T, HIGH SENSITIVITY  TROPONIN T, HIGH SENSITIVITY    EKG Sinus rhythm with first-degree AV block, right bundle branch block, no STEMI  RADIOLOGY CT head interpreted by me without evidence of acute intracranial pathology CT cervical spine interpreted by me without evidence of fracture or dislocation CXR interpreted by me without evidence of acute cardiopulmonary pathology. Plain film of the pelvis and right hip interpreted by me with pubic rami fracture on the left  Official radiology report(s): DG Hip Unilat W or Wo Pelvis 2-3 Views Right Result Date: 02/04/2024 EXAM: 2 or 3 VIEW(S) XRAY OF THE RIGHT HIP 02/04/2024 02:26:00 AM COMPARISON: 10/13/2023. CLINICAL HISTORY: fall, right sided pain FINDINGS: BONES AND JOINTS: Prior right hip replacement. Lucency in the greater trochanter is again noted, compatible with chronic greater trochanter fracture, unchanged. Irregularity noted in the left superior and inferior  pubic rami compatible with acute fractures. No malalignment. SOFT TISSUES: The soft tissues are unremarkable. IMPRESSION: 1. Acute fractures of the left superior and inferior pubic rami. 2. Right hip arthroplasty with unchanged old greater trochanter fracture. Electronically signed by: Franky Crease MD 02/04/2024 02:33 AM EST RP Workstation: [id number]   DG Chest Portable 1 View Result Date: 02/04/2024 EXAM: 1 VIEW(S) XRAY OF THE CHEST 02/04/2024 02:26:00 AM COMPARISON: 10/08/2023. CLINICAL HISTORY: fall, right sided pain FINDINGS: LUNGS AND PLEURA: No focal pulmonary opacity. No pleural effusion. No pneumothorax. HEART AND MEDIASTINUM: No acute abnormality of the cardiac and mediastinal silhouettes. BONES AND SOFT TISSUES: No acute osseous abnormality. IMPRESSION: 1. No acute cardiopulmonary process identified. Electronically signed by: Franky Crease MD 02/04/2024 02:30 AM EST RP Workstation: [id number]   CT Cervical Spine Wo Contrast Result Date: 02/03/2024 EXAM: CT CERVICAL SPINE WITHOUT CONTRAST 02/03/2024 08:34:48 PM TECHNIQUE: CT of the cervical spine was performed without the administration of intravenous contrast. Multiplanar reformatted images are provided for review. Automated exposure control, iterative reconstruction, and/or weight based adjustment of the mA/kV was utilized to reduce the radiation dose to as low as reasonably achievable. COMPARISON: None available. CLINICAL HISTORY: fall fall FINDINGS: CERVICAL SPINE: BONES AND ALIGNMENT: No acute fracture or traumatic malalignment. DEGENERATIVE CHANGES: C5-C6 moderate degenerative changes of the spine. SOFT TISSUES: No prevertebral soft tissue swelling. IMPRESSION: 1. No acute abnormality of the cervical spine. Electronically signed by: Morgane Naveau MD 02/03/2024 08:45 PM EST RP Workstation: [id number]   CT HEAD WO CONTRAST Result Date: 02/03/2024 EXAM: CT HEAD WITHOUT 02/03/2024 08:34:48 PM TECHNIQUE: CT of the head was performed without the  administration of intravenous contrast. Automated exposure control, iterative reconstruction, and/or weight based adjustment of the mA/kV was utilized to reduce the radiation dose to as low as reasonably achievable. COMPARISON: 02/03/2024 CLINICAL HISTORY: Ataxia, acute, traumatic (Ped 0-17y) FINDINGS: BRAIN AND VENTRICLES: No acute intracranial hemorrhage. No mass effect or midline shift. No extra-axial fluid collection. No hydrocephalus. Patchy and confluent decreased attenuation throughout the deep and periventricular white matter of the cerebral hemispheres bilaterally, compatible with chronic microvascular ischemic disease. Chronic left occipital infarction. Atherosclerotic calcifications within the cavernous internal carotid arteries. ORBITS: No acute abnormality. SINUSES AND MASTOIDS: No acute abnormality. SOFT TISSUES AND SKULL: No acute skull fracture. 3 x 0.6cm right frontal scalp lipoma. IMPRESSION: 1. No acute intracranial abnormality. 2. Right frontal scalp lipoma. Electronically signed by: Morgane Naveau MD 02/03/2024 08:44 PM EST RP Workstation:  [id number]   CT Head Wo Contrast Result Date: 02/03/2024 CLINICAL DATA:  Tremors worsening today.  Recent fall. EXAM: CT HEAD WITHOUT CONTRAST TECHNIQUE: Contiguous axial images were obtained from the base of the skull through the vertex without intravenous contrast. RADIATION DOSE REDUCTION: This exam was performed according to the departmental dose-optimization program which includes automated exposure control, adjustment of the mA and/or kV according to patient size and/or use of iterative reconstruction technique. COMPARISON:  12/01/2023 FINDINGS: Brain: Ventricles and cisterns are normal. CSF spaces are unremarkable. Evidence of chronic ischemic microvascular disease. Old left posterior parietal/occipital watershed infarct. No mass, mass effect, shift of midline structures or acute hemorrhage. Vascular: No hyperdense vessel or unexpected  calcification. Skull: Normal. Negative for fracture or focal lesion. Sinuses/Orbits: Orbits are normal symmetric. Hypoplastic frontal sinuses. Minimal opacification over the ethmoid air cells. Mastoid air cells are clear. Other: None. IMPRESSION: 1. No acute findings. 2. Chronic ischemic microvascular disease and old left posterior parietal/occipital watershed infarct. Electronically Signed   By: Toribio Agreste M.D.   On: 02/03/2024 15:28    PROCEDURES and INTERVENTIONS:  Procedures  Medications  acetaminophen  (TYLENOL ) tablet 1,000 mg (has no administration in time range)     IMPRESSION / MDM / ASSESSMENT AND PLAN / ED COURSE  I reviewed the triage vital signs and the nursing notes.  Differential diagnosis includes, but is not limited to, stroke, seizure, deconditioning, periprosthetic hip fracture, pubic rami fracture, rib fracture, ICH  {Patient presents with symptoms of an acute illness or injury that is potentially life-threatening.  Patient presents after another fall.  He is asymptomatic and at his baseline.  Imaging does demonstrate acute fractures pubic rami on the left side but he has no pain there.  He is up and ambulatory with a walker at his baseline.  Recent blood work with CBC, metabolic panel, UA without concerning features.  Negative troponins.  As below, his sister is with him to take him home in the morning.  We will keep him in the ED until that time.  Patient suitable for outpatient management  Clinical Course as of 02/04/24 0246  Fri Feb 04, 2024  0148 I call sister, Dickey. He just keeps having tremors, like he has Parkinson's or something. Found him in the floor, repeating same words over and over. Uses a walker to get around .  If he is up and ambulatory at his baseline with reassuring workup she would be happy with him coming home in the morning after it is daylight.  She is 73 [address] old and cannot drive overnight [DS]  [id number] Ambulation trial performed,  well-tolerated.  He denies any pain and reports feeling fine.  He is stable with a walker and asking about going home. [DS]    Clinical Course User Index [DS] Claudene Rover, MD     FINAL CLINICAL IMPRESSION(S) / ED DIAGNOSES   Final diagnoses:  Fall, initial encounter  Closed fracture of ramus of left pubis, initial encounter [facility])     Rx / DC Orders   ED Discharge Orders     None        Note:  This document was prepared using Dragon voice recognition software and may include unintentional dictation errors.   Claudene Rover, MD 02/04/24 [phone]

## 2024-02-10 ENCOUNTER — Inpatient Hospital Stay

## 2024-02-10 ENCOUNTER — Ambulatory Visit: Payer: Self-pay | Admitting: Oncology

## 2024-02-10 ENCOUNTER — Inpatient Hospital Stay: Attending: Oncology | Admitting: Oncology

## 2024-02-10 VITALS — BP 169/74 | HR 58 | Temp 96.8°F | Resp 18 | Wt 169.9 lb

## 2024-02-10 DIAGNOSIS — Z79899 Other long term (current) drug therapy: Secondary | ICD-10-CM | POA: Diagnosis not present

## 2024-02-10 DIAGNOSIS — I4891 Unspecified atrial fibrillation: Secondary | ICD-10-CM | POA: Insufficient documentation

## 2024-02-10 DIAGNOSIS — Z87891 Personal history of nicotine dependence: Secondary | ICD-10-CM | POA: Insufficient documentation

## 2024-02-10 DIAGNOSIS — D649 Anemia, unspecified: Secondary | ICD-10-CM | POA: Diagnosis not present

## 2024-02-10 DIAGNOSIS — F32A Depression, unspecified: Secondary | ICD-10-CM | POA: Diagnosis not present

## 2024-02-10 DIAGNOSIS — Z96649 Presence of unspecified artificial hip joint: Secondary | ICD-10-CM | POA: Diagnosis not present

## 2024-02-10 DIAGNOSIS — I110 Hypertensive heart disease with heart failure: Secondary | ICD-10-CM | POA: Insufficient documentation

## 2024-02-10 DIAGNOSIS — Z139 Encounter for screening, unspecified: Secondary | ICD-10-CM

## 2024-02-10 DIAGNOSIS — Z7982 Long term (current) use of aspirin: Secondary | ICD-10-CM | POA: Diagnosis not present

## 2024-02-10 DIAGNOSIS — I5032 Chronic diastolic (congestive) heart failure: Secondary | ICD-10-CM | POA: Diagnosis not present

## 2024-02-10 DIAGNOSIS — F1010 Alcohol abuse, uncomplicated: Secondary | ICD-10-CM | POA: Insufficient documentation

## 2024-02-10 DIAGNOSIS — G40909 Epilepsy, unspecified, not intractable, without status epilepticus: Secondary | ICD-10-CM | POA: Insufficient documentation

## 2024-02-10 DIAGNOSIS — Z8673 Personal history of transient ischemic attack (TIA), and cerebral infarction without residual deficits: Secondary | ICD-10-CM | POA: Insufficient documentation

## 2024-02-10 DIAGNOSIS — Z809 Family history of malignant neoplasm, unspecified: Secondary | ICD-10-CM | POA: Diagnosis not present

## 2024-02-10 DIAGNOSIS — Z7901 Long term (current) use of anticoagulants: Secondary | ICD-10-CM | POA: Diagnosis not present

## 2024-02-10 LAB — CBC WITH DIFFERENTIAL/PLATELET
Abs Immature Granulocytes: 0.03 K/uL (ref 0.00–0.07)
Basophils Absolute: 0.1 K/uL (ref 0.0–0.1)
Basophils Relative: 1 %
Eosinophils Absolute: 0.2 K/uL (ref 0.0–0.5)
Eosinophils Relative: 3 %
HCT: 32.6 % — ABNORMAL LOW (ref 39.0–52.0)
Hemoglobin: 10.1 g/dL — ABNORMAL LOW (ref 13.0–17.0)
Immature Granulocytes: 1 %
Lymphocytes Relative: 26 %
Lymphs Abs: 1.5 K/uL (ref 0.7–4.0)
MCH: 26.9 pg (ref 26.0–34.0)
MCHC: 31 g/dL (ref 30.0–36.0)
MCV: 86.7 fL (ref 80.0–100.0)
Monocytes Absolute: 0.7 K/uL (ref 0.1–1.0)
Monocytes Relative: 11 %
Neutro Abs: 3.4 K/uL (ref 1.7–7.7)
Neutrophils Relative %: 58 %
Platelets: 234 K/uL (ref 150–400)
RBC: 3.76 MIL/uL — ABNORMAL LOW (ref 4.22–5.81)
RDW: 16 % — ABNORMAL HIGH (ref 11.5–15.5)
WBC: 5.8 K/uL (ref 4.0–10.5)
nRBC: 0 % (ref 0.0–0.2)

## 2024-02-10 LAB — IRON AND TIBC
Iron: 32 ug/dL — ABNORMAL LOW (ref 45–182)
Saturation Ratios: 11 % — ABNORMAL LOW (ref 17.9–39.5)
TIBC: 294 ug/dL (ref 250–450)
UIBC: 262 ug/dL

## 2024-02-10 LAB — VITAMIN B12: Vitamin B-12: 858 pg/mL (ref 180–914)

## 2024-02-10 LAB — TECHNOLOGIST SMEAR REVIEW: Plt Morphology: ADEQUATE

## 2024-02-10 LAB — RETIC PANEL
Immature Retic Fract: 13.9 % (ref 2.3–15.9)
RBC.: 3.81 MIL/uL — ABNORMAL LOW (ref 4.22–5.81)
Retic Count, Absolute: 43.8 K/uL (ref 19.0–186.0)
Retic Ct Pct: 1.2 % (ref 0.4–3.1)
Reticulocyte Hemoglobin: 26.4 pg — ABNORMAL LOW (ref >27.9)

## 2024-02-10 LAB — FERRITIN: Ferritin: 51 ng/mL (ref 24–336)

## 2024-02-10 LAB — FOLATE: Folate: >20 ng/mL (ref >5.9)

## 2024-02-10 LAB — LACTATE DEHYDROGENASE: LDH: 170 U/L (ref 105–235)

## 2024-02-10 NOTE — Progress Notes (Signed)
 Hematology/Oncology Consult note [phone] [phone]        REFERRING PROVIDER: Cecily Katz, PA-C   CHIEF COMPLAINTS/REASON FOR VISIT:  Evaluation of anemia   ASSESSMENT & PLAN:   Normocytic anemia Chronic anemia, with current hemoglobin close to his baseline.  Check cbc, iron  tibc ferritin, b12, folate, myeloma panel, light chain ratio, LDH, haptoglobin  Alcohol abuse Recommend alcohol cessation  Atrial fibrillation (HCC) On Xarelto    Orders Placed This Encounter  Procedures   Iron  and TIBC    Standing Status:   Future    Expected Date:   02/10/2024    Expiration Date:   02/09/2025   Ferritin    Standing Status:   Future    Expected Date:   02/10/2024    Expiration Date:   08/10/2024   Folate    Standing Status:   Future    Expected Date:   02/10/2024    Expiration Date:   02/09/2025   Vitamin B12    Standing Status:   Future    Expected Date:   02/10/2024    Expiration Date:   02/09/2025   CBC with Differential/Platelet    Standing Status:   Future    Expected Date:   02/10/2024    Expiration Date:   02/09/2025   Retic Panel    Standing Status:   Future    Expected Date:   02/10/2024    Expiration Date:   02/09/2025   Multiple Myeloma Panel (SPEP&IFE w/QIG)    Standing Status:   Future    Expected Date:   02/10/2024    Expiration Date:   02/09/2025   Kappa/lambda light chains    Standing Status:   Future    Expected Date:   02/10/2024    Expiration Date:   02/09/2025   Lactate dehydrogenase    Standing Status:   Future    Expected Date:   02/10/2024    Expiration Date:   02/09/2025   Haptoglobin    Standing Status:   Future    Expected Date:   02/10/2024    Expiration Date:   02/09/2025   Technologist smear review    Standing Status:   Future    Expected Date:   02/10/2024    Expiration Date:   02/09/2025    Clinical information::   anemia   Ferritin    Standing Status:   Future    Number of Occurrences:   1     Expected Date:   02/10/2024    Expiration Date:   05/10/2024   Iron  and TIBC    Standing Status:   Future    Number of Occurrences:   1    Expected Date:   02/10/2024    Expiration Date:   05/10/2024   Folate    Standing Status:   Future    Number of Occurrences:   1    Expected Date:   02/10/2024    Expiration Date:   05/10/2024   Vitamin B12    Standing Status:   Future    Number of Occurrences:   1    Expected Date:   02/10/2024    Expiration Date:   05/10/2024   CBC with Differential/Platelet    Standing Status:   Future    Number of Occurrences:   1    Expected Date:   02/10/2024    Expiration Date:   05/10/2024   Multiple Myeloma Panel (SPEP&IFE w/QIG)    Standing Status:  Future    Number of Occurrences:   1    Expected Date:   02/10/2024    Expiration Date:   05/10/2024   Kappa/lambda light chains    Standing Status:   Future    Number of Occurrences:   1    Expected Date:   02/10/2024    Expiration Date:   05/10/2024   Lactate dehydrogenase    Standing Status:   Future    Number of Occurrences:   1    Expected Date:   02/10/2024    Expiration Date:   05/10/2024   Haptoglobin    Standing Status:   Future    Number of Occurrences:   1    Expected Date:   02/10/2024    Expiration Date:   05/10/2024   Retic Panel    Standing Status:   Future    Number of Occurrences:   1    Expected Date:   02/10/2024    Expiration Date:   05/10/2024   Technologist smear review    Standing Status:   Future    Number of Occurrences:   1    Expected Date:   02/10/2024    Expiration Date:   05/10/2024    Clinical information::   anemia   Ambulatory referral to Social Work    Referral Priority:   Routine    Referral Type:   Consultation    Referral Reason:   Specialty Services Required    Number of Visits Requested:   1    All questions were answered. The patient knows to call the clinic with any problems, questions or concerns.  Zelphia Cap, MD, PhD [facility] Hematology  Oncology 02/10/2024   HISTORY OF PRESENTING ILLNESS:   John Spears is a  73 y.o.  Spears with PMH listed below was seen in consultation at the request of  Cecily Katz, PA-C  for evaluation of anemia   Discussed the use of AI scribe software for clinical note transcription with the patient, who gave verbal consent to proceed.   He has experienced chronic anemia with hemoglobin levels consistently below normal since at least 2018, with recent values over the past six months ranging from 8 to 10 g/dL. His current hemoglobin is approximately 10 g/dL, which is stable for him and consistent with his chronic baseline. Not all hematologic testing has been completed to date.  He is currently on chronic anticoagulation for atrial fibrillation. He experienced a cerebral infarction one to two years ago, resulting in right-sided hemiplegia and intermittent tremors. He denies any history of venous thromboembolism. Additional medical history includes seizures, cardiomyopathy, hypertension, and prior hip replacement. He ambulates with difficulty and is able to walk only short distances.  Patient drinks alcohol, last drink was 2-3 weeks ago.    MEDICAL HISTORY:  Past Medical History:  Diagnosis Date   Alcohol abuse    Atrial fibrillation (HCC)    Heart failure with improved ejection fraction (HFimpEF) (HCC) 02/2022   a. 02/2019 Echo: EF 60-65%, GrI DD; b. 01/2022 Echo: EF 30-35%; c. 06/2022 Echo: EF 60-65%, mild LVH, RI DD, nl RV fxn, triv MR.   History of CVA (cerebrovascular accident)    Hypertension    Seizure (HCC)    Seizure disorder (HCC)     SURGICAL HISTORY: Past Surgical History:  Procedure Laterality Date   JOINT REPLACEMENT      SOCIAL HISTORY: Social History   Socioeconomic History   Marital status: Married  Spouse name: Not on file   Number of children: Not on file   Years of education: Not on file   Highest education level: Not on file  Occupational History   Not  on file  Tobacco Use   Smoking status: Former   Smokeless tobacco: Never  Vaping Use   Vaping status: Never Used  Substance and Sexual Activity   Alcohol use: Yes    Alcohol/week: 84.0 standard drinks of alcohol    Types: 84 Cans of beer per week    Comment: reported by sister 6-12 beers a day and liquor when he can get it   Drug use: Yes    Types: Marijuana   Sexual activity: Not on file  Other Topics Concern   Not on file  Social History Narrative   ** Merged History Encounter **       Social Drivers of Health   Tobacco Use: Medium Risk (02/10/2024)   Patient History    Smoking Tobacco Use: Former    Smokeless Tobacco Use: Never    Passive Exposure: Not on file  Financial Resource Strain: Medium Risk (02/10/2024)   Overall Financial Resource Strain (CARDIA)    Difficulty of Paying Living Expenses: Somewhat hard  Food Insecurity: Food Insecurity Present (02/10/2024)   Epic    Worried About Programme Researcher, Broadcasting/film/video in the Last Year: Sometimes true    Ran Out of Food in the Last Year: Sometimes true  Transportation Needs: Unmet Transportation Needs (02/10/2024)   Epic    Lack of Transportation (Medical): Yes    Lack of Transportation (Non-Medical): No  Physical Activity: Not on file  Stress: No Stress Concern Present (02/10/2024)   [employer] of Occupational Health - Occupational Stress Questionnaire    Feeling of Stress: Not at all  Social Connections: Moderately Isolated (10/06/2023)   Social Connection and Isolation Panel    Frequency of Communication with Friends and Family: More than three times a week    Frequency of Social Gatherings with Friends and Family: More than three times a week    Attends Religious Services: 1 to 4 times per year    Active Member of [employer] or Organizations: No    Attends Banker Meetings: Never    Marital Status: Never married  Intimate Partner Violence: Not At Risk (02/10/2024)   Epic    Fear of Current or Ex-Partner:  No    Emotionally Abused: No    Physically Abused: No    Sexually Abused: No  Depression (PHQ2-9): Low Risk (02/10/2024)   Depression (PHQ2-9)    PHQ-2 Score: 0  Alcohol Screen: Not on file  Housing: Low Risk (02/10/2024)   Epic    Unable to Pay for Housing in the Last Year: No    Number of Times Moved in the Last Year: 0    Homeless in the Last Year: No  Recent Concern: Housing - High Risk (12/20/2023)   Received from [facility]   Epic    In the last 12 months, was there a time when you were not able to pay the mortgage or rent on time?: Yes    In the past 12 months, how many times have you moved where you were living?: 0    At any time in the past 12 months, were you homeless or living in a shelter (including now)?: No  Utilities: Not At Risk (02/10/2024)   Epic    Threatened with loss of utilities: No  Health Literacy: Adequate Health Literacy (02/10/2024)   B1300 Health Literacy    Frequency of need for help with medical instructions: Never    FAMILY HISTORY: Family History  Problem Relation Age of Onset   Heart attack Mother 37   Cancer Father    Cancer Sister     ALLERGIES:  has no known allergies.  MEDICATIONS:  Current Outpatient Medications  Medication Sig Dispense Refill   acetaminophen  (TYLENOL ) 500 MG tablet Take 1 tablet (500 mg total) by mouth every 6 (six) hours as needed for moderate pain (pain score 4-6). 30 tablet 0   amLODipine  (NORVASC ) 10 MG tablet Take 10 mg by mouth at bedtime.     ASPIRIN  LOW DOSE 81 MG tablet Take 81 mg by mouth daily.     atorvastatin  (LIPITOR) 40 MG tablet Take 40 mg by mouth at bedtime.     carvedilol  (COREG ) 12.5 MG tablet Take 1 tablet (12.5 mg total) by mouth 2 (two) times daily. 180 tablet 3   cyanocobalamin  (VITAMIN B12) 1000 MCG tablet Take 1,000 mcg by mouth in the morning.     feeding supplement (ENSURE ENLIVE / ENSURE PLUS) LIQD Take 237 mLs by mouth 2 (two) times daily between meals.     folic  acid (FOLVITE ) 1 MG tablet Take 1 mg by mouth in the morning.     furosemide  (LASIX ) 20 MG tablet TAKE 1 TABLET BY MOUTH ONCE A DAY 90 tablet 2   gabapentin  (NEURONTIN ) 100 MG capsule Take 100 mg by mouth 2 (two) times daily.     Lacosamide  150 MG TABS Take 1 tablet by mouth 2 (two) times daily.     levETIRAcetam  (KEPPRA ) 750 MG tablet Take 2 tablets (1,500 mg total) by mouth 2 (two) times daily. 120 tablet 0   Multiple Vitamins-Iron  (MULTIVITAMINS WITH IRON ) TABS tablet Take 1 tablet by mouth at bedtime.     oxyCODONE -acetaminophen  (PERCOCET/ROXICET) 5-325 MG tablet Take 1 tablet by mouth every 6 (six) hours as needed for moderate pain (pain score 4-6) or severe pain (pain score 7-10). 15 tablet 0   polyethylene glycol (MIRALAX  / GLYCOLAX ) 17 g packet Take 34 g by mouth daily. 14 each 0   rivaroxaban  (XARELTO ) 20 MG TABS tablet TAKE 1 TABLET BY MOUTH ONCE A DAY WITH SUPPER 30 tablet 5   senna (SENOKOT) 8.6 MG TABS tablet Take 1 tablet (8.6 mg total) by mouth daily. 120 tablet 0   tamsulosin  (FLOMAX ) 0.4 MG CAPS capsule Take 0.4 mg by mouth at bedtime.     thiamine  (VITAMIN B1) 100 MG tablet Take 100 mg by mouth in the morning.     No current facility-administered medications for this visit.    Review of Systems  Constitutional:  Positive for fatigue. Negative for appetite change, chills and fever.  HENT:   Negative for hearing loss and voice change.   Eyes:  Negative for eye problems.  Respiratory:  Negative for chest tightness, cough and shortness of breath.   Cardiovascular:  Negative for chest pain.  Gastrointestinal:  Negative for abdominal distention, abdominal pain and blood in stool.  Endocrine: Negative for hot flashes.  Genitourinary:  Negative for difficulty urinating and frequency.   Musculoskeletal:  Positive for gait problem. Negative for arthralgias.  Skin:  Negative for itching and rash.  Neurological:  Positive for extremity weakness and gait problem.  Hematological:   Negative for adenopathy.  Psychiatric/Behavioral:  Negative for confusion.    PHYSICAL EXAMINATION:  Vitals:   02/10/24  1401 02/10/24 1417  BP: (!) 181/67 (!) 169/74  Pulse: (!) 58   Resp: 18   Temp: (!) 96.8 F (36 C)   SpO2: 100%    Filed Weights   02/10/24 1401  Weight: 169 lb 14.4 oz (77.1 kg)    Physical Exam Constitutional:      General: He is not in acute distress. HENT:     Head: Normocephalic and atraumatic.  Eyes:     General: No scleral icterus. Cardiovascular:     Rate and Rhythm: Normal rate and regular rhythm.  Pulmonary:     Effort: Pulmonary effort is normal. No respiratory distress.     Breath sounds: No wheezing.  Abdominal:     General: Bowel sounds are normal. There is no distension.     Palpations: Abdomen is soft.  Musculoskeletal:        General: Normal range of motion.     Cervical back: Normal range of motion and neck supple.  Skin:    General: Skin is warm and dry.     Findings: No erythema or rash.  Neurological:     Mental Status: He is alert and oriented to person, place, and time. Mental status is at baseline.  Psychiatric:        Mood and Affect: Mood normal.     LABORATORY DATA:  I have reviewed the data as listed    Latest Ref Rng & Units 02/10/2024    2:54 PM 02/03/2024    8:02 PM 02/03/2024    1:14 PM  CBC  WBC 4.0 - 10.5 K/uL 5.8  6.9  5.6   Hemoglobin 13.0 - 17.0 g/dL 89.8  89.3  89.8   Hematocrit 39.0 - 52.0 % 32.6  34.0  32.3   Platelets 150 - 400 K/uL 234  218  213       Latest Ref Rng & Units 02/03/2024    8:02 PM 02/03/2024    1:14 PM 11/30/2023   11:10 PM  CMP  Glucose 70 - 99 mg/dL 896  895  883   BUN 8 - 23 mg/dL 12  11  23    Creatinine 0.61 - 1.24 mg/dL 8.97  9.03  8.86   Sodium 135 - 145 mmol/L 142  141  135   Potassium 3.5 - 5.1 mmol/L 3.7  4.1  4.5   Chloride 98 - 111 mmol/L 108  107  105   CO2 22 - 32 mmol/L 21  25  22    Calcium  8.9 - 10.3 mg/dL 9.9  9.5  8.8   Total Protein 6.5 - 8.1 g/dL 7.2   6.9    Total Bilirubin 0.0 - 1.2 mg/dL 0.3  0.4    Alkaline Phos 38 - 126 U/L 159  147    AST 15 - 41 U/L 24  30    ALT 0 - 44 U/L 10  12        RADIOGRAPHIC STUDIES: I have personally reviewed the radiological images as listed and agreed with the findings in the report. DG Hip Unilat W or Wo Pelvis 2-3 Views Right Result Date: 02/04/2024 EXAM: 2 or 3 VIEW(S) XRAY OF THE RIGHT HIP 02/04/2024 02:26:00 AM COMPARISON: 10/13/2023. CLINICAL HISTORY: fall, right sided pain FINDINGS: BONES AND JOINTS: Prior right hip replacement. Lucency in the greater trochanter is again noted, compatible with chronic greater trochanter fracture, unchanged. Irregularity noted in the left superior and inferior pubic rami compatible with acute fractures. No malalignment. SOFT TISSUES: The soft  tissues are unremarkable. IMPRESSION: 1. Acute fractures of the left superior and inferior pubic rami. 2. Right hip arthroplasty with unchanged old greater trochanter fracture. Electronically signed by: Franky Crease MD 02/04/2024 02:33 AM EST RP Workstation: [id number]   DG Chest Portable 1 View Result Date: 02/04/2024 EXAM: 1 VIEW(S) XRAY OF THE CHEST 02/04/2024 02:26:00 AM COMPARISON: 10/08/2023. CLINICAL HISTORY: fall, right sided pain FINDINGS: LUNGS AND PLEURA: No focal pulmonary opacity. No pleural effusion. No pneumothorax. HEART AND MEDIASTINUM: No acute abnormality of the cardiac and mediastinal silhouettes. BONES AND SOFT TISSUES: No acute osseous abnormality. IMPRESSION: 1. No acute cardiopulmonary process identified. Electronically signed by: Franky Crease MD 02/04/2024 02:30 AM EST RP Workstation: [id number]   CT Cervical Spine Wo Contrast Result Date: 02/03/2024 EXAM: CT CERVICAL SPINE WITHOUT CONTRAST 02/03/2024 08:34:48 PM TECHNIQUE: CT of the cervical spine was performed without the administration of intravenous contrast. Multiplanar reformatted images are provided for review. Automated exposure control, iterative  reconstruction, and/or weight based adjustment of the mA/kV was utilized to reduce the radiation dose to as low as reasonably achievable. COMPARISON: None available. CLINICAL HISTORY: fall fall FINDINGS: CERVICAL SPINE: BONES AND ALIGNMENT: No acute fracture or traumatic malalignment. DEGENERATIVE CHANGES: C5-C6 moderate degenerative changes of the spine. SOFT TISSUES: No prevertebral soft tissue swelling. IMPRESSION: 1. No acute abnormality of the cervical spine. Electronically signed by: Morgane Naveau MD 02/03/2024 08:45 PM EST RP Workstation: [id number]   CT HEAD WO CONTRAST Result Date: 02/03/2024 EXAM: CT HEAD WITHOUT 02/03/2024 08:34:48 PM TECHNIQUE: CT of the head was performed without the administration of intravenous contrast. Automated exposure control, iterative reconstruction, and/or weight based adjustment of the mA/kV was utilized to reduce the radiation dose to as low as reasonably achievable. COMPARISON: 02/03/2024 CLINICAL HISTORY: Ataxia, acute, traumatic (Ped 0-17y) FINDINGS: BRAIN AND VENTRICLES: No acute intracranial hemorrhage. No mass effect or midline shift. No extra-axial fluid collection. No hydrocephalus. Patchy and confluent decreased attenuation throughout the deep and periventricular white matter of the cerebral hemispheres bilaterally, compatible with chronic microvascular ischemic disease. Chronic left occipital infarction. Atherosclerotic calcifications within the cavernous internal carotid arteries. ORBITS: No acute abnormality. SINUSES AND MASTOIDS: No acute abnormality. SOFT TISSUES AND SKULL: No acute skull fracture. 3 x 0.6cm right frontal scalp lipoma. IMPRESSION: 1. No acute intracranial abnormality. 2. Right frontal scalp lipoma. Electronically signed by: Morgane Naveau MD 02/03/2024 08:44 PM EST RP Workstation: [id number]   CT Head Wo Contrast Result Date: 02/03/2024 CLINICAL DATA:  Tremors worsening today.  Recent fall. EXAM: CT HEAD WITHOUT CONTRAST TECHNIQUE:  Contiguous axial images were obtained from the base of the skull through the vertex without intravenous contrast. RADIATION DOSE REDUCTION: This exam was performed according to the departmental dose-optimization program which includes automated exposure control, adjustment of the mA and/or kV according to patient size and/or use of iterative reconstruction technique. COMPARISON:  12/01/2023 FINDINGS: Brain: Ventricles and cisterns are normal. CSF spaces are unremarkable. Evidence of chronic ischemic microvascular disease. Old left posterior parietal/occipital watershed infarct. No mass, mass effect, shift of midline structures or acute hemorrhage. Vascular: No hyperdense vessel or unexpected calcification. Skull: Normal. Negative for fracture or focal lesion. Sinuses/Orbits: Orbits are normal symmetric. Hypoplastic frontal sinuses. Minimal opacification over the ethmoid air cells. Mastoid air cells are clear. Other: None. IMPRESSION: 1. No acute findings. 2. Chronic ischemic microvascular disease and old left posterior parietal/occipital watershed infarct. Electronically Signed   By: Toribio Agreste M.D.   On: 02/03/2024 15:28   CT Head Wo Contrast Result Date: 12/01/2023 EXAM:  CT HEAD AND CERVICAL SPINE 12/01/2023 12:28:06 AM TECHNIQUE: CT of the head and cervical spine was performed without the administration of intravenous contrast. Multiplanar reformatted images are provided for review. Automated exposure control, iterative reconstruction, and/or weight based adjustment of the mA/kV was utilized to reduce the radiation dose to as low as reasonably achievable. COMPARISON: None available. CLINICAL HISTORY: Fall. Patient brought in by EMS from home for reported seizures and multiple falls, history of seizures. Patient reports no loss of consciousness, possible head impact. FINDINGS: CT HEAD BRAIN AND VENTRICLES: No acute intracranial hemorrhage. No mass effect or midline shift. No abnormal extra-axial fluid  collection. No evidence of acute infarct. No hydrocephalus. Remote left posterior MCA territory infarct. ORBITS: No acute abnormality. SINUSES AND MASTOIDS: No acute abnormality. SOFT TISSUES AND SKULL: No acute skull fracture. No acute soft tissue abnormality. CT CERVICAL SPINE BONES AND ALIGNMENT: No acute fracture or traumatic malalignment. DEGENERATIVE CHANGES: Multilevel facet and uncovertebral hypertrophy contributes to varying degrees of neural foraminal stenosis. SOFT TISSUES: No prevertebral soft tissue swelling. IMPRESSION: 1. No acute intracranial abnormality. 2. No acute fracture or traumatic malalignment in the cervical spine. Electronically signed by: Gilmore Molt MD 12/01/2023 12:37 AM EDT RP Workstation: [id number]   CT CERVICAL SPINE WO CONTRAST Result Date: 12/01/2023 EXAM: CT HEAD AND CERVICAL SPINE 12/01/2023 12:28:06 AM TECHNIQUE: CT of the head and cervical spine was performed without the administration of intravenous contrast. Multiplanar reformatted images are provided for review. Automated exposure control, iterative reconstruction, and/or weight based adjustment of the mA/kV was utilized to reduce the radiation dose to as low as reasonably achievable. COMPARISON: None available. CLINICAL HISTORY: Fall. Patient brought in by EMS from home for reported seizures and multiple falls, history of seizures. Patient reports no loss of consciousness, possible head impact. FINDINGS: CT HEAD BRAIN AND VENTRICLES: No acute intracranial hemorrhage. No mass effect or midline shift. No abnormal extra-axial fluid collection. No evidence of acute infarct. No hydrocephalus. Remote left posterior MCA territory infarct. ORBITS: No acute abnormality. SINUSES AND MASTOIDS: No acute abnormality. SOFT TISSUES AND SKULL: No acute skull fracture. No acute soft tissue abnormality. CT CERVICAL SPINE BONES AND ALIGNMENT: No acute fracture or traumatic malalignment. DEGENERATIVE CHANGES: Multilevel facet and  uncovertebral hypertrophy contributes to varying degrees of neural foraminal stenosis. SOFT TISSUES: No prevertebral soft tissue swelling. IMPRESSION: 1. No acute intracranial abnormality. 2. No acute fracture or traumatic malalignment in the cervical spine. Electronically signed by: Gilmore Molt MD 12/01/2023 12:37 AM EDT RP Workstation: [id number]   DG Elbow Complete Right Result Date: 11/30/2023 CLINICAL DATA:  Recent fall with elbow pain, initial encounter EXAM: RIGHT ELBOW - COMPLETE 3+ VIEW COMPARISON:  None Available. FINDINGS: There is no evidence of fracture, dislocation, or joint effusion. There is no evidence of arthropathy or other focal bone abnormality. Soft tissues are unremarkable. IMPRESSION: No acute abnormality noted. Electronically Signed   By: Oneil Devonshire M.D.   On: 11/30/2023 23:34

## 2024-02-10 NOTE — Assessment & Plan Note (Signed)
 Recommend alcohol cessation.

## 2024-02-10 NOTE — Assessment & Plan Note (Signed)
 Chronic anemia, with current hemoglobin close to his baseline.  Check cbc, iron  tibc ferritin, b12, folate, myeloma panel, light chain ratio, LDH, haptoglobin

## 2024-02-10 NOTE — Assessment & Plan Note (Signed)
 On Xarelto

## 2024-02-11 ENCOUNTER — Inpatient Hospital Stay

## 2024-02-11 LAB — HAPTOGLOBIN: Haptoglobin: 156 mg/dL (ref 34–355)

## 2024-02-11 LAB — PATHOLOGIST SMEAR REVIEW

## 2024-02-11 LAB — KAPPA/LAMBDA LIGHT CHAINS
Kappa free light chain: 25.2 mg/L — ABNORMAL HIGH (ref 3.3–19.4)
Kappa, lambda light chain ratio: 1.27 (ref 0.26–1.65)
Lambda free light chains: 19.8 mg/L (ref 5.7–26.3)

## 2024-02-11 NOTE — Progress Notes (Signed)
 Message sent to Lab

## 2024-02-11 NOTE — Progress Notes (Signed)
 [facility] Clinical Social Work  Clinical Social Work was referred by medical provider for SDOH needs.  Clinical Social Worker contacted caregiver by phone to offer support and assess for needs.     Interventions: CSW spoke with patient's sister, Dickey.  Discussed community resources.  Mailed list to [place].  Informed her that patient is not currently eligible for hospital grant due to not receiving cancer treatment.  She said she understood.      Follow Up Plan:  Patient will contact CSW with any support or resource needs    Macario CHRISTELLA Au, LCSW  Clinical Social Worker [facility]        Patient is participating in a Managed Medicaid Plan:  Yes

## 2024-02-11 NOTE — Progress Notes (Signed)
 Lab will submit to pathology for path review.

## 2024-02-12 LAB — MULTIPLE MYELOMA PANEL, SERUM
Albumin SerPl Elph-Mcnc: 3.5 g/dL (ref 2.9–4.4)
Albumin/Glob SerPl: 1.1 (ref 0.7–1.7)
Alpha 1: 0.2 g/dL (ref 0.0–0.4)
Alpha2 Glob SerPl Elph-Mcnc: 0.7 g/dL (ref 0.4–1.0)
B-Globulin SerPl Elph-Mcnc: 1 g/dL (ref 0.7–1.3)
Gamma Glob SerPl Elph-Mcnc: 1.3 g/dL (ref 0.4–1.8)
Globulin, Total: 3.2 g/dL (ref 2.2–3.9)
IgA: 240 mg/dL (ref 61–437)
IgG (Immunoglobin G), Serum: 1322 mg/dL (ref 603–1613)
IgM (Immunoglobulin M), Srm: 60 mg/dL (ref 15–143)
Total Protein ELP: 6.7 g/dL (ref 6.0–8.5)

## 2024-02-21 ENCOUNTER — Ambulatory Visit: Admitting: Cardiology

## 2024-02-28 ENCOUNTER — Other Ambulatory Visit: Payer: Self-pay

## 2024-02-28 ENCOUNTER — Emergency Department

## 2024-02-28 ENCOUNTER — Encounter: Payer: Self-pay | Admitting: Emergency Medicine

## 2024-02-28 ENCOUNTER — Inpatient Hospital Stay
Admission: EM | Admit: 2024-02-28 | Discharge: 2024-03-01 | DRG: 068 | Disposition: A | Discharge status: Home Health | Attending: Internal Medicine | Admitting: Internal Medicine

## 2024-02-28 DIAGNOSIS — F1010 Alcohol abuse, uncomplicated: Secondary | ICD-10-CM | POA: Diagnosis present

## 2024-02-28 DIAGNOSIS — I4891 Unspecified atrial fibrillation: Secondary | ICD-10-CM | POA: Diagnosis present

## 2024-02-28 DIAGNOSIS — G459 Transient cerebral ischemic attack, unspecified: Principal | ICD-10-CM

## 2024-02-28 DIAGNOSIS — R29810 Facial weakness: Secondary | ICD-10-CM | POA: Diagnosis present

## 2024-02-28 DIAGNOSIS — Z7982 Long term (current) use of aspirin: Secondary | ICD-10-CM

## 2024-02-28 DIAGNOSIS — Z8673 Personal history of transient ischemic attack (TIA), and cerebral infarction without residual deficits: Secondary | ICD-10-CM | POA: Diagnosis present

## 2024-02-28 DIAGNOSIS — Z7901 Long term (current) use of anticoagulants: Secondary | ICD-10-CM

## 2024-02-28 DIAGNOSIS — Z8249 Family history of ischemic heart disease and other diseases of the circulatory system: Secondary | ICD-10-CM

## 2024-02-28 DIAGNOSIS — G40909 Epilepsy, unspecified, not intractable, without status epilepticus: Secondary | ICD-10-CM | POA: Diagnosis present

## 2024-02-28 DIAGNOSIS — Z91199 Patient's noncompliance with other medical treatment and regimen due to unspecified reason: Secondary | ICD-10-CM

## 2024-02-28 DIAGNOSIS — I6521 Occlusion and stenosis of right carotid artery: Secondary | ICD-10-CM | POA: Diagnosis present

## 2024-02-28 DIAGNOSIS — I69351 Hemiplegia and hemiparesis following cerebral infarction affecting right dominant side: Secondary | ICD-10-CM

## 2024-02-28 DIAGNOSIS — R569 Unspecified convulsions: Secondary | ICD-10-CM

## 2024-02-28 DIAGNOSIS — I639 Cerebral infarction, unspecified: Secondary | ICD-10-CM | POA: Diagnosis present

## 2024-02-28 DIAGNOSIS — R471 Dysarthria and anarthria: Secondary | ICD-10-CM | POA: Diagnosis not present

## 2024-02-28 DIAGNOSIS — F10239 Alcohol dependence with withdrawal, unspecified: Secondary | ICD-10-CM

## 2024-02-28 DIAGNOSIS — I480 Paroxysmal atrial fibrillation: Secondary | ICD-10-CM | POA: Diagnosis present

## 2024-02-28 DIAGNOSIS — E785 Hyperlipidemia, unspecified: Secondary | ICD-10-CM | POA: Diagnosis present

## 2024-02-28 DIAGNOSIS — R531 Weakness: Principal | ICD-10-CM | POA: Diagnosis present

## 2024-02-28 DIAGNOSIS — I5032 Chronic diastolic (congestive) heart failure: Secondary | ICD-10-CM | POA: Diagnosis present

## 2024-02-28 DIAGNOSIS — N183 Chronic kidney disease, stage 3 unspecified: Secondary | ICD-10-CM | POA: Diagnosis present

## 2024-02-28 DIAGNOSIS — R2990 Unspecified symptoms and signs involving the nervous system: Secondary | ICD-10-CM | POA: Diagnosis present

## 2024-02-28 DIAGNOSIS — Z87891 Personal history of nicotine dependence: Secondary | ICD-10-CM

## 2024-02-28 DIAGNOSIS — I130 Hypertensive heart and chronic kidney disease with heart failure and stage 1 through stage 4 chronic kidney disease, or unspecified chronic kidney disease: Secondary | ICD-10-CM | POA: Diagnosis present

## 2024-02-28 DIAGNOSIS — N400 Enlarged prostate without lower urinary tract symptoms: Secondary | ICD-10-CM | POA: Diagnosis present

## 2024-02-28 DIAGNOSIS — I10 Essential (primary) hypertension: Secondary | ICD-10-CM | POA: Diagnosis present

## 2024-02-28 DIAGNOSIS — F1090 Alcohol use, unspecified, uncomplicated: Secondary | ICD-10-CM | POA: Diagnosis present

## 2024-02-28 DIAGNOSIS — Z79899 Other long term (current) drug therapy: Secondary | ICD-10-CM

## 2024-02-28 DIAGNOSIS — G252 Other specified forms of tremor: Secondary | ICD-10-CM | POA: Diagnosis not present

## 2024-02-28 DIAGNOSIS — Y900 Blood alcohol level of less than 20 mg/100 ml: Secondary | ICD-10-CM | POA: Diagnosis present

## 2024-02-28 DIAGNOSIS — I6621 Occlusion and stenosis of right posterior cerebral artery: Principal | ICD-10-CM | POA: Diagnosis present

## 2024-02-28 LAB — COMPREHENSIVE METABOLIC PANEL WITH GFR
ALT: 9 U/L (ref 0–44)
AST: 19 U/L (ref 15–41)
Albumin: 4.1 g/dL (ref 3.5–5.0)
Alkaline Phosphatase: 148 U/L — ABNORMAL HIGH (ref 38–126)
Anion gap: 14 (ref 5–15)
BUN: 19 mg/dL (ref 8–23)
CO2: 21 mmol/L — ABNORMAL LOW (ref 22–32)
Calcium: 9.3 mg/dL (ref 8.9–10.3)
Chloride: 104 mmol/L (ref 98–111)
Creatinine, Ser: 0.98 mg/dL (ref 0.61–1.24)
GFR, Estimated: >60 mL/min
Glucose, Bld: 86 mg/dL (ref 70–99)
Potassium: 3.9 mmol/L (ref 3.5–5.1)
Sodium: 140 mmol/L (ref 135–145)
Total Bilirubin: 0.2 mg/dL (ref 0.0–1.2)
Total Protein: 7.2 g/dL (ref 6.5–8.1)

## 2024-02-28 LAB — DIFFERENTIAL
Abs Immature Granulocytes: 0.01 K/uL (ref 0.00–0.07)
Basophils Absolute: 0.1 K/uL (ref 0.0–0.1)
Basophils Relative: 1 %
Eosinophils Absolute: 0.2 K/uL (ref 0.0–0.5)
Eosinophils Relative: 3 %
Immature Granulocytes: 0 %
Lymphocytes Relative: 32 %
Lymphs Abs: 2 K/uL (ref 0.7–4.0)
Monocytes Absolute: 0.7 K/uL (ref 0.1–1.0)
Monocytes Relative: 12 %
Neutro Abs: 3.2 K/uL (ref 1.7–7.7)
Neutrophils Relative %: 52 %

## 2024-02-28 LAB — CBC
HCT: 32.9 % — ABNORMAL LOW (ref 39.0–52.0)
Hemoglobin: 10.4 g/dL — ABNORMAL LOW (ref 13.0–17.0)
MCH: 26.7 pg (ref 26.0–34.0)
MCHC: 31.6 g/dL (ref 30.0–36.0)
MCV: 84.4 fL (ref 80.0–100.0)
Platelets: 217 K/uL (ref 150–400)
RBC: 3.9 MIL/uL — ABNORMAL LOW (ref 4.22–5.81)
RDW: 15.4 % (ref 11.5–15.5)
WBC: 6.2 K/uL (ref 4.0–10.5)
nRBC: 0 % (ref 0.0–0.2)

## 2024-02-28 LAB — ETHANOL: Alcohol, Ethyl (B): <15 mg/dL

## 2024-02-28 LAB — PROTIME-INR
INR: 1.3 — ABNORMAL HIGH (ref 0.8–1.2)
Prothrombin Time: 17.3 s — ABNORMAL HIGH (ref 11.4–15.2)

## 2024-02-28 LAB — MAGNESIUM: Magnesium: 1.6 mg/dL — ABNORMAL LOW (ref 1.7–2.4)

## 2024-02-28 LAB — CBG MONITORING, ED: Glucose-Capillary: 79 mg/dL (ref 70–99)

## 2024-02-28 LAB — APTT: aPTT: 36 s (ref 24–36)

## 2024-02-28 MED ORDER — TENECTEPLASE 25 MG IV KIT
PACK | INTRAVENOUS | Status: AC
  Filled 2024-02-28: qty 5

## 2024-02-28 MED ORDER — LORAZEPAM 2 MG/ML IJ SOLN
1.0000 mg | INTRAMUSCULAR | Status: DC | PRN
  Administered 2024-02-29: 1 mg via INTRAVENOUS
  Administered 2024-02-29: 2 mg via INTRAVENOUS
  Filled 2024-02-28 (×2): qty 1

## 2024-02-28 MED ORDER — LEVETIRACETAM 500 MG PO TABS
1500.0000 mg | ORAL_TABLET | Freq: Two times a day (BID) | ORAL | Status: DC
  Administered 2024-02-29 – 2024-03-01 (×3): 1500 mg via ORAL
  Filled 2024-02-28 (×3): qty 3

## 2024-02-28 MED ORDER — RIVAROXABAN 20 MG PO TABS
20.0000 mg | ORAL_TABLET | Freq: Every day | ORAL | Status: DC
  Filled 2024-02-28: qty 1

## 2024-02-28 MED ORDER — LACOSAMIDE 50 MG PO TABS
200.0000 mg | ORAL_TABLET | Freq: Two times a day (BID) | ORAL | Status: DC
  Administered 2024-02-29 – 2024-03-01 (×4): 200 mg via ORAL
  Filled 2024-02-28 (×4): qty 4

## 2024-02-28 MED ORDER — ATORVASTATIN CALCIUM 20 MG PO TABS
40.0000 mg | ORAL_TABLET | Freq: Every day | ORAL | Status: DC
  Administered 2024-02-29 (×2): 40 mg via ORAL
  Filled 2024-02-28 (×2): qty 2

## 2024-02-28 MED ORDER — ADULT MULTIVITAMIN W/MINERALS CH
1.0000 | ORAL_TABLET | Freq: Every day | ORAL | Status: DC
  Administered 2024-02-29 – 2024-03-01 (×2): 1 via ORAL
  Filled 2024-02-28 (×2): qty 1

## 2024-02-28 MED ORDER — RIVAROXABAN 20 MG PO TABS: 20.0000 mg | ORAL_TABLET | Freq: Every day | ORAL | Status: DC

## 2024-02-28 MED ORDER — THIAMINE HCL 100 MG/ML IJ SOLN: 100.0000 mg | Freq: Every day | INTRAMUSCULAR | Status: DC

## 2024-02-28 MED ORDER — SENNOSIDES-DOCUSATE SODIUM 8.6-50 MG PO TABS: 1.0000 | ORAL_TABLET | Freq: Every evening | ORAL | Status: DC | PRN

## 2024-02-28 MED ORDER — LEVETIRACETAM (KEPPRA) 500 MG/5 ML ADULT IV PUSH
3000.0000 mg | Freq: Once | INTRAVENOUS | Status: AC
  Administered 2024-02-28: 3000 mg via INTRAVENOUS
  Filled 2024-02-28: qty 30

## 2024-02-28 MED ORDER — ONDANSETRON HCL 4 MG PO TABS: 4.0000 mg | ORAL_TABLET | Freq: Four times a day (QID) | ORAL | Status: DC | PRN

## 2024-02-28 MED ORDER — SODIUM CHLORIDE 0.9% FLUSH
3.0000 mL | Freq: Two times a day (BID) | INTRAVENOUS | Status: DC
  Administered 2024-02-28 – 2024-03-01 (×4): 3 mL via INTRAVENOUS

## 2024-02-28 MED ORDER — ACETAMINOPHEN 650 MG RE SUPP: 650.0000 mg | Freq: Four times a day (QID) | RECTAL | Status: DC | PRN

## 2024-02-28 MED ORDER — TAMSULOSIN HCL 0.4 MG PO CAPS
0.4000 mg | ORAL_CAPSULE | Freq: Every day | ORAL | Status: DC
  Administered 2024-02-29 (×2): 0.4 mg via ORAL
  Filled 2024-02-28 (×2): qty 1

## 2024-02-28 MED ORDER — STROKE: EARLY STAGES OF RECOVERY BOOK: Freq: Once | Status: AC

## 2024-02-28 MED ORDER — THIAMINE MONONITRATE 100 MG PO TABS
100.0000 mg | ORAL_TABLET | Freq: Every day | ORAL | Status: DC
  Administered 2024-02-29 – 2024-03-01 (×2): 100 mg via ORAL
  Filled 2024-02-28 (×2): qty 1

## 2024-02-28 MED ORDER — IOHEXOL 350 MG/ML SOLN
75.0000 mL | Freq: Once | INTRAVENOUS | Status: AC | PRN
  Administered 2024-02-28: 75 mL via INTRAVENOUS

## 2024-02-28 MED ORDER — RIVAROXABAN 20 MG PO TABS
20.0000 mg | ORAL_TABLET | Freq: Every day | ORAL | Status: DC
  Administered 2024-02-28 – 2024-03-01 (×3): 20 mg via ORAL
  Filled 2024-02-28 (×3): qty 1

## 2024-02-28 MED ORDER — ONDANSETRON HCL 4 MG/2ML IJ SOLN: 4.0000 mg | Freq: Four times a day (QID) | INTRAMUSCULAR | Status: DC | PRN

## 2024-02-28 MED ORDER — ASPIRIN 81 MG PO TBEC
81.0000 mg | DELAYED_RELEASE_TABLET | Freq: Every day | ORAL | Status: DC
  Administered 2024-02-29 – 2024-03-01 (×2): 81 mg via ORAL
  Filled 2024-02-28 (×2): qty 1

## 2024-02-28 MED ORDER — ACETAMINOPHEN 325 MG PO TABS
650.0000 mg | ORAL_TABLET | Freq: Four times a day (QID) | ORAL | Status: DC | PRN
  Administered 2024-02-29: 650 mg via ORAL
  Filled 2024-02-28: qty 2

## 2024-02-28 MED ORDER — LORAZEPAM 1 MG PO TABS
1.0000 mg | ORAL_TABLET | ORAL | Status: DC | PRN
  Administered 2024-02-29: 1 mg via ORAL
  Filled 2024-02-28: qty 1

## 2024-02-28 MED ORDER — FOLIC ACID 1 MG PO TABS
1.0000 mg | ORAL_TABLET | Freq: Every day | ORAL | Status: DC
  Administered 2024-02-29 – 2024-03-01 (×2): 1 mg via ORAL
  Filled 2024-02-28 (×2): qty 1

## 2024-02-28 NOTE — ED Provider Notes (Signed)
 "  [facility] Provider Note    Event Date/Time   First MD Initiated Contact with Patient 02/28/24 1934     (approximate)   History   Code Stroke   HPI  John Spears is a 74 y.o. male history of prior TIAs presenting today with concern of strokelike symptoms.  Today while going to the bathroom developed sudden onset right upper extremity weakness and right-sided facial droop apparently also some sort of visual changes, by the time EMS arrived the symptoms were gradually improving, but then when enroute had recurrence of symptoms with continued right-sided weakness.  EMS provided majority of history, apparently he has been out of his home medications for about 2 days now, found to be hypertensive he does take Xarelto  which seems to be from underlying atrial fibrillation history per chart review.   Physical Exam   Triage Vital Signs: ED Triage Vitals  Encounter Vitals Group     BP      Girls Systolic BP Percentile      Girls Diastolic BP Percentile      Boys Systolic BP Percentile      Boys Diastolic BP Percentile      Pulse      Resp      Temp      Temp src      SpO2      Weight      Height      Head Circumference      Peak Flow      Pain Score      Pain Loc      Pain Education      Exclude from Growth Chart     Most recent vital signs: Vitals:   02/28/24 2017 02/28/24 2058  BP: (!) 155/91   Pulse: 69   Resp: 18   Temp: 98.1 F (36.7 C)   SpO2: 100% 100%     General: Awake, no distress.  Tremulous in extremities CV:  Good peripheral perfusion.  Resp:  Normal effort.  Able to speak in full sentences Abd:  No distention.  Soft nontender Neuro:  Cranial nerves II to XII are intact, no noted slurred speech, he seems to have appropriate strength in the bilateral upper extremities, some tremors noted in the left upper extremity with some difficulty with finger-nose which the patient states is about baseline.  Bilateral heel-to-shin is  about equal where there does not seem to be unsteadiness but he needs assistance placing the heel onto the shin. Other:     ED Results / Procedures / Treatments   Labs (all labs ordered are listed, but only abnormal results are displayed) Labs Reviewed  PROTIME-INR - Abnormal; Notable for the following components:      Result Value   Prothrombin Time 17.3 (*)    INR 1.3 (*)    All other components within normal limits  CBC - Abnormal; Notable for the following components:   RBC 3.90 (*)    Hemoglobin 10.4 (*)    HCT 32.9 (*)    All other components within normal limits  COMPREHENSIVE METABOLIC PANEL WITH GFR - Abnormal; Notable for the following components:   CO2 21 (*)    Alkaline Phosphatase 148 (*)    All other components within normal limits  APTT  ETHANOL  DIFFERENTIAL  MAGNESIUM   BASIC METABOLIC PANEL WITH GFR  CBC  HEMOGLOBIN A1C  LIPID PANEL  CBG MONITORING, ED     EKG  On my  independent interpretation EKG demonstrates a sinus rhythm with rate of about 80, axis of -60, QRS appears widened consistent with a right bundle branch block, nonspecific T wave inversions noted in multiple leads, no acute ischemic findings on my independent interpretation   RADIOLOGY   PROCEDURES:  Critical Care performed: Yes, see critical care procedure note(s)  .Critical Care  Performed by: Fernand Rossie HERO, MD Authorized by: Fernand Rossie HERO, MD   Critical care provider statement:    Critical care time (minutes):  35   Critical care was necessary to treat or prevent imminent or life-threatening deterioration of the following conditions:  CNS failure or compromise   Critical care was time spent personally by me on the following activities:  Ordering and performing treatments and interventions, ordering and review of laboratory studies, examination of patient, discussions with consultants, development of treatment plan with patient or surrogate, pulse oximetry, re-evaluation of  patient's condition, review of old charts and ordering and review of radiographic studies   Care discussed with: admitting provider      MEDICATIONS ORDERED IN ED: Medications  rivaroxaban  (XARELTO ) tablet 20 mg (20 mg Oral Given 02/28/24 2106)  sodium chloride  flush (NS) 0.9 % injection 3 mL (has no administration in time range)  acetaminophen  (TYLENOL ) tablet 650 mg (has no administration in time range)    Or  acetaminophen  (TYLENOL ) suppository 650 mg (has no administration in time range)  senna-docusate (Senokot-S) tablet 1 tablet (has no administration in time range)  ondansetron  (ZOFRAN ) tablet 4 mg (has no administration in time range)    Or  ondansetron  (ZOFRAN ) injection 4 mg (has no administration in time range)  LORazepam  (ATIVAN ) tablet 1-4 mg (has no administration in time range)    Or  LORazepam  (ATIVAN ) injection 1-4 mg (has no administration in time range)  thiamine  (VITAMIN B1) tablet 100 mg (has no administration in time range)    Or  thiamine  (VITAMIN B1) injection 100 mg (has no administration in time range)  folic acid  (FOLVITE ) tablet 1 mg (has no administration in time range)  multivitamin with minerals tablet 1 tablet (has no administration in time range)   stroke: early stages of recovery book (has no administration in time range)  iohexol  (OMNIPAQUE ) 350 MG/ML injection 75 mL (75 mLs Intravenous Contrast Given 02/28/24 1949)  levETIRAcetam  (KEPPRA ) undiluted injection 3,000 mg (3,000 mg Intravenous Given 02/28/24 2107)     IMPRESSION / MDM / ASSESSMENT AND PLAN / ED COURSE  I reviewed the triage vital signs and the nursing notes.                               Patient's presentation is most consistent with acute presentation with potential threat to life or bodily function.  74 year old male presenting today with concern of right sided weakness in the setting of prior strokes.  Symptoms seem to have improved on EMS transport then worsened again and then  improved again while in the CT scanner.  He was evaluated by neurology, no thrombolytics were indicated.  Currently his neuroexam seems to be improving yet again, CT imaging is without acute findings, will plan for admission to medicine service once labs result.   Clinical Course as of 02/28/24 2245  Mon Feb 28, 2024  1948 Spoke with radiology, no acute findings on noncontrast CT head aside from old infarcts. [SK]  2232 Spoke with Dr. Fernand from medicine service who has agreed to evaluate the  patient to determine course of further medical management at this time. [SK]    Clinical Course User Index [SK] Fernand Rossie HERO, MD     FINAL CLINICAL IMPRESSION(S) / ED DIAGNOSES   Final diagnoses:  TIA (transient ischemic attack)     Rx / DC Orders   ED Discharge Orders     None        Note:  This document was prepared using Dragon voice recognition software and may include unintentional dictation errors.   Fernand Rossie HERO, MD 02/28/24 2245  "

## 2024-02-28 NOTE — Consult Note (Signed)
 NEUROLOGY CONSULT NOTE   Date of service: February 28, 2024 Patient Name: John Spears MRN:  [id number] DOB:  October 21, 1950 Chief Complaint: Right sided weakness Requesting Provider: No att. providers found  History of Present Illness  John Spears is a 74 y.o. male very well-known to our service with a PMHx of EtOH abuse, atrial fibrillation, HF with improved EF, left parietal lobe cortically-based ischemic infarction with mild right hemiparesis at baseline, HTN, atrial fibrillation (on Eliquis ) and seizures with multiple episodes of breakthrough seizures due to noncompliance (on Vimpat  and Keppra ) who presents via EMS to the ED from home as a Code Stroke after sudden onset of RUE weakness. On EMS arrival he was conversant, without facial droop and no longer with right sided weakness. He was able to ambulate with some assistance to the EMS truck. While en route to the ED, he became weak again on his right side. No seizure activity reported. On arrival to the ED he was diffusely tremulous and appeared anxious. He denies having had a seizure. He states that he has taken none of his medications since Saturday morning (states this to examiner, and initially told EMS it had been 2 days since his last medications, but also endorsed to another staff member that it had been one day since last meds). He was last seen by Neurology inpatient in early July, at which time he had presented for breakthrough seizures and was noted by Neurology to be in focal motor status with retained awareness that had aborted with IV Ativan .   LKW: 6:50 PM Modified rankin score: 2-Slight disability-UNABLE to perform all activities but does not need assistance IV Thrombolysis/EVT:  No: Symptoms too mild to treat   NIHSS components Score: Comment  1a Level of Conscious 0[x]  1[]  2[]  3[]      1b LOC Questions 0[]  1[]  2[x]      States month is December, age as 3, day as Saturday. Correctly gives the year, the city and  the state.   1c LOC Commands 0[x]  1[]  2[]       2 Best Gaze 0[x]  1[]  2[]       3 Visual 0[x]  1[]  2[]  3[]      Has difficulty responding consistently, requiring multiple tries  4 Facial Palsy 0[x]  1[]  2[]  3[]      5a Motor Arm - left 0[x]  1[]  2[]  3[]  4[]  UN[]   Resists slowly with 4+/5 strength in all 4 extremities.   5b Motor Arm - Right 0[x]  1[]  2[]  3[]  4[]  UN[]    6a Motor Leg - Left 0[x]  1[]  2[]  3[]  4[]  UN[]    6b Motor Leg - Right 0[x]  1[]  2[]  3[]  4[]  UN[]    7 Limb Ataxia 0[x]  1[]  2[]  UN[]     Diffuse tremulousness of BUE and face is noted.   8 Sensory 0[]  1[x]  2[]  UN[]     Decreased sensation to right leg and left arm. Equal sensation to forehead bilaterally.   9 Best Language 0[x]  1[]  2[]  3[]       10 Dysarthria 0[x]  1[]  2[]  UN[]      11 Extinct. and Inattention 0[x]  1[]  2[]       TOTAL:   3      ROS  Comprehensive ROS performed and pertinent positives documented in HPI    Past History   Past Medical History:  Diagnosis Date   Alcohol abuse    Atrial fibrillation (HCC)    Heart failure with improved ejection fraction (HFimpEF) (HCC) 02/2022   a. 02/2019 Echo: EF 60-65%, GrI DD; b. 01/2022  Echo: EF 30-35%; c. 06/2022 Echo: EF 60-65%, mild LVH, RI DD, nl RV fxn, triv MR.   History of CVA (cerebrovascular accident)    Hypertension    Seizure (HCC)    Seizure disorder [facility])     Past Surgical History:  Procedure Laterality Date   JOINT REPLACEMENT      Family History: Family History  Problem Relation Age of Onset   Heart attack Mother 69   Cancer Father    Cancer Sister     Social History  reports that he has quit smoking. He has never used smokeless tobacco. He reports current alcohol use of about 84.0 standard drinks of alcohol per week. He reports current drug use. Drug: Marijuana.  Allergies[1]  Medications  Current Medications[2]  Vitals   BP (!) 155/91 (BP Location: Left Arm)   Pulse 69   Temp 98.1 F (36.7 C) (Oral)   Resp 18   Ht 5' 11 (1.803 m)   Wt 77.6  kg   SpO2 100%   BMI 23.85 kg/m    Physical Exam   Constitutional: Appears well-developed and well-nourished.  Psych: Mixed anxious and somewhat blunted affect Eyes: No scleral injection.  HENT: No OP obstruction.  Head: Normocephalic.  Respiratory: Effort normal, non-labored breathing.  Skin: WDI.   Neurologic Examination   See NIHSS  Labs/Imaging/Neurodiagnostic studies   CBC: No results for input(s): WBC, NEUTROABS, HGB, HCT, MCV, PLT in the last 168 hours. Basic Metabolic Panel:  Lab Results  Component Value Date   NA 142 02/03/2024   K 3.7 02/03/2024   CO2 21 (L) 02/03/2024   GLUCOSE 103 (H) 02/03/2024   BUN 12 02/03/2024   CREATININE 1.02 02/03/2024   CALCIUM  9.9 02/03/2024   GFRNONAA >60 02/03/2024   GFRAA >60 04/04/2019   Lipid Panel:  Lab Results  Component Value Date   LDLCALC 133 (H) 03/08/2019   HgbA1c:  Lab Results  Component Value Date   HGBA1C 4.7 (L) 08/27/2023   Urine Drug Screen:     Component Value Date/Time   LABOPIA NONE DETECTED 08/26/2023 0445   LABOPIA NONE DETECTED 01/27/2023 1800   COCAINSCRNUR NONE DETECTED 08/26/2023 0445   LABBENZ POSITIVE (A) 08/26/2023 0445   LABBENZ POSITIVE (A) 01/27/2023 1800   AMPHETMU NONE DETECTED 08/26/2023 0445   AMPHETMU NONE DETECTED 01/27/2023 1800   THCU NONE DETECTED 08/26/2023 0445   THCU NONE DETECTED 01/27/2023 1800   LABBARB NONE DETECTED 08/26/2023 0445   LABBARB POSITIVE (A) 01/27/2023 1800    Alcohol Level     Component Value Date/Time   ETH <15 10/06/2023 1040   INR  Lab Results  Component Value Date   INR 2.1 (H) 08/27/2023   APTT  Lab Results  Component Value Date   APTT 42 (H) 08/26/2023   AED levels:  Lab Results  Component Value Date   PHENYTOIN  11.9 02/02/2023   LEVETIRACETA 85.9 (H) 08/26/2023      ASSESSMENT  John Spears is a 74 y.o. male very well-known to our service with a PMHx of EtOH abuse, atrial fibrillation, HF with improved EF,  left parietal lobe cortically-based ischemic infarction with mild right hemiparesis at baseline, HTN, atrial fibrillation (on Eliquis ) and seizures with multiple episodes of breakthrough seizures due to noncompliance (on Vimpat  and Keppra ) who presents via EMS to the ED from home as a Code Stroke after sudden onset of RUE weakness. He has missed 1-2 days of all of his medications due to delayed delivery to  his home of his pre-packaged medication tray.  - Exam reveals diffuse tremulousness, anxious affect, poor orientation and sensory deficits. NIHSS 3.  - CT head: No acute intracranial abnormality.  ASPECTS of 10. 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 - CTA of head and neck: No large vessel occlusion. Moderate right P2 PCA stenosis. Mild to moderate right intracranial ICA stenosis. - Impression: Acute onset of stuttering right sided weakness. DDx for underlying etiology includes TIA and psychogenic pseudostroke. The patient may also be withdrawing from EtOH given his tremulousness.   RECOMMENDATIONS  - CIWA protocol with PRN Ativan  given his history of heavy EtOH use and tremulousness with mild disorientation in the ED.  - Restart home antihypertensive regimen - Restart his home Xarelto  (20 mg po every day). Give first dose now.  - Restart his home Vimpat  200 mg BID and Keppra  1500 mg BID. Would give a 3000 mg IV Keppra  load now.  - He is also on ASA and Lipitor at home.  - MRI brain - Had a recent echocardiogram in May of 2024 - Cardiac telemetry - PT/OT/Speech - Inpatient seizure precautions - Expedite his swallow evaluation so that we can restart his medications.     ______________________________________________________________________    Bonney SHARK, Cipriano Millikan, MD [facility]     [1] No Known Allergies [2] No current facility-administered medications for this encounter.  Current Outpatient Medications:    acetaminophen  (TYLENOL ) 500 MG tablet, Take 1 tablet  (500 mg total) by mouth every 6 (six) hours as needed for moderate pain (pain score 4-6)., Disp: 30 tablet, Rfl: 0   amLODipine  (NORVASC ) 10 MG tablet, Take 10 mg by mouth at bedtime., Disp: , Rfl:    ASPIRIN  LOW DOSE 81 MG tablet, Take 81 mg by mouth daily., Disp: , Rfl:    atorvastatin  (LIPITOR) 40 MG tablet, Take 40 mg by mouth at bedtime., Disp: , Rfl:    carvedilol  (COREG ) 12.5 MG tablet, Take 1 tablet (12.5 mg total) by mouth 2 (two) times daily., Disp: 180 tablet, Rfl: 3   cyanocobalamin  (VITAMIN B12) 1000 MCG tablet, Take 1,000 mcg by mouth in the morning., Disp: , Rfl:    feeding supplement (ENSURE ENLIVE / ENSURE PLUS) LIQD, Take 237 mLs by mouth 2 (two) times daily between meals., Disp: , Rfl:    folic acid  (FOLVITE ) 1 MG tablet, Take 1 mg by mouth in the morning., Disp: , Rfl:    furosemide  (LASIX ) 20 MG tablet, TAKE 1 TABLET BY MOUTH ONCE A DAY, Disp: 90 tablet, Rfl: 2   gabapentin  (NEURONTIN ) 100 MG capsule, Take 100 mg by mouth 2 (two) times daily., Disp: , Rfl:    Lacosamide  150 MG TABS, Take 1 tablet by mouth 2 (two) times daily., Disp: , Rfl:    levETIRAcetam  (KEPPRA ) 750 MG tablet, Take 2 tablets (1,500 mg total) by mouth 2 (two) times daily., Disp: 120 tablet, Rfl: 0   Multiple Vitamins-Iron  (MULTIVITAMINS WITH IRON ) TABS tablet, Take 1 tablet by mouth at bedtime., Disp: , Rfl:    oxyCODONE -acetaminophen  (PERCOCET/ROXICET) 5-325 MG tablet, Take 1 tablet by mouth every 6 (six) hours as needed for moderate pain (pain score 4-6) or severe pain (pain score 7-10)., Disp: 15 tablet, Rfl: 0   polyethylene glycol (MIRALAX  / GLYCOLAX ) 17 g packet, Take 34 g by mouth daily., Disp: 14 each, Rfl: 0   rivaroxaban  (XARELTO ) 20 MG TABS tablet, TAKE 1 TABLET BY MOUTH ONCE A DAY WITH SUPPER, Disp: 30 tablet, Rfl: 5  senna (SENOKOT) 8.6 MG TABS tablet, Take 1 tablet (8.6 mg total) by mouth daily., Disp: 120 tablet, Rfl: 0   tamsulosin  (FLOMAX ) 0.4 MG CAPS capsule, Take 0.4 mg by mouth at  bedtime., Disp: , Rfl:    thiamine  (VITAMIN B1) 100 MG tablet, Take 100 mg by mouth in the morning., Disp: , Rfl:

## 2024-02-28 NOTE — H&P (Signed)
 " History and Physical    John Spears FMW:[id number] DOB: 30-Jun-1950 DOA: 02/28/2024  DOS: the patient was seen and examined on 02/28/2024  PCP: Patient, No Pcp Per   Patient coming from: Home  I have personally briefly reviewed patient's old medical records in [facility] and CareEverywhere  HPI:   John Spears is a 74 y.o. year old male with medical history of hypertension, hyperlipidemia, paroxysmal A-fib, CHF (EF 60%, G1 DD in 06/2022), history of CVA with mild right-sided weakness at baseline, alcohol use disorder presenting to the ED when he had right arm weakness.  This improved and as patient was and route recurred again.  Code stroke was activated.  Patient states repeatedly I did not fall off no truck multiple times.  When asked further questioning regarding this he answers the question but not able to elaborate on this statement.  He denies any acute complaints.  Denies any URI symptoms.  States he had a stroke long time ago.  States he lives with his sister.  Does not currently smoke but does drink beer.  On arrival to the ED patient was noted to be HDS stable.  Lab work and imaging obtained.  CBC without leukocytosis, mild anemia at baseline.  CMP grossly unremarkable except mild ALP elevation.  Ethanol level negative.  CT head without any acute abnormalities, which showed chronic left MCA and right thalamic infarcts.  CTA without any large vessel occlusion, moderate right PCA stenosis and mild to moderate right intracranial ICA stenosis.  Neurology was consulted and recommended admission and recommended admission for TIA workup versus psychogenic pseudo stroke.  Given this, [facility] contacted for admission.  Review of Systems: As mentioned in the history of present illness. All other systems reviewed and are negative.   Past Medical History:  Diagnosis Date   Alcohol abuse    Atrial fibrillation (HCC)    Heart failure with improved ejection fraction (HFimpEF) (HCC)  02/2022   a. 02/2019 Echo: EF 60-65%, GrI DD; b. 01/2022 Echo: EF 30-35%; c. 06/2022 Echo: EF 60-65%, mild LVH, RI DD, nl RV fxn, triv MR.   History of CVA (cerebrovascular accident)    Hypertension    Seizure (HCC)    Seizure disorder [facility])     Past Surgical History:  Procedure Laterality Date   JOINT REPLACEMENT       Allergies[1]  Family History  Problem Relation Age of Onset   Heart attack Mother 4   Cancer Father    Cancer Sister     Prior to Admission medications  Medication Sig Start Date End Date Taking? Authorizing Provider  acetaminophen  (TYLENOL ) 500 MG tablet Take 1 tablet (500 mg total) by mouth every 6 (six) hours as needed for moderate pain (pain score 4-6). 08/04/23  Yes Dezii, Alexandra, DO  amLODipine  (NORVASC ) 10 MG tablet Take 10 mg by mouth at bedtime. 01/13/23  Yes [provider]  ASPIRIN  LOW DOSE 81 MG tablet Take 81 mg by mouth daily. 07/05/23  Yes [provider]  atorvastatin  (LIPITOR) 40 MG tablet Take 40 mg by mouth at bedtime.   Yes [provider]  carvedilol  (COREG ) 12.5 MG tablet Take 1 tablet (12.5 mg total) by mouth 2 (two) times daily. 07/05/23 07/04/24 Yes Hammock, Sheri, NP  carvedilol  (COREG ) 3.125 MG tablet Take 3.125 mg by mouth 2 (two) times daily.   Yes [provider]  cyanocobalamin  (VITAMIN B12) 1000 MCG tablet Take 1,000 mcg by mouth in the morning. 11/24/22  Yes [provider]  folic acid  (FOLVITE ) 1 MG tablet Take 1 mg by mouth in the morning. 07/16/22  Yes [provider]  furosemide  (LASIX ) 20 MG tablet TAKE 1 TABLET BY MOUTH ONCE A DAY 11/17/23  Yes Vivienne Lonni Ingle, NP  gabapentin  (NEURONTIN ) 100 MG capsule Take 100 mg by mouth 2 (two) times daily.   Yes [provider]  gabapentin  (NEURONTIN ) 300 MG capsule Take 300 mg by mouth every 8 (eight) hours as needed. 12/07/23 03/06/24 Yes [provider]  hydrocortisone 2.5 % cream Apply 1 Application topically 2  (two) times daily as needed. 09/13/23  Yes [provider]  lacosamide  (VIMPAT ) 200 MG TABS tablet Take 200 mg by mouth 2 (two) times daily.   Yes [provider]  Lacosamide  150 MG TABS Take 1 tablet by mouth 2 (two) times daily.   Yes [provider]  levETIRAcetam  (KEPPRA ) 750 MG tablet Take 2 tablets (1,500 mg total) by mouth 2 (two) times daily. 03/11/23 02/28/24 Yes Ghimire, Kuber, MD  Multiple Vitamin (MULTI-VITAMIN) tablet Take 1 tablet by mouth daily.   Yes [provider]  Multiple Vitamins-Iron  (MULTIVITAMINS WITH IRON ) TABS tablet Take 1 tablet by mouth at bedtime.   Yes [provider]  oxyCODONE -acetaminophen  (PERCOCET/ROXICET) 5-325 MG tablet Take 1 tablet by mouth every 6 (six) hours as needed for moderate pain (pain score 4-6) or severe pain (pain score 7-10). 10/12/23  Yes Patel, Sona, MD  polyethylene glycol (MIRALAX  / GLYCOLAX ) 17 g packet Take 34 g by mouth daily. 10/13/23  Yes Tobie Calix, MD  rivaroxaban  (XARELTO ) 20 MG TABS tablet TAKE 1 TABLET BY MOUTH ONCE A DAY WITH SUPPER 11/17/23  Yes End, Lonni, MD  tamsulosin  (FLOMAX ) 0.4 MG CAPS capsule Take 0.4 mg by mouth at bedtime.   Yes [provider]  thiamine  (VITAMIN B1) 100 MG tablet Take 100 mg by mouth in the morning. 07/16/22  Yes [provider]  benzoyl peroxide 5 % gel SMARTSIG:sparingly Topical Every Night Patient not taking: Reported on 02/28/2024 01/04/24   [provider]  feeding supplement (ENSURE ENLIVE / ENSURE PLUS) LIQD Take 237 mLs by mouth 2 (two) times daily between meals. 12/30/22   Briana Elgin LABOR, MD  oxyCODONE  (OXY IR/ROXICODONE ) 5 MG immediate release tablet Take 5 mg by mouth every 4 (four) hours as needed. Patient not taking: Reported on 02/28/2024 12/07/23   [provider]  senna (SENOKOT) 8.6 MG TABS tablet Take 1 tablet (8.6 mg total) by mouth daily. Patient not taking: Reported on 02/28/2024 10/13/23   Tobie Calix, MD     Social History:  reports that he has quit smoking. He has never used smokeless tobacco. He reports current alcohol use of about 84.0 standard drinks of alcohol per week. He reports current drug use. Drug: Marijuana.    Physical Exam: Vitals:   02/28/24 2017 02/28/24 2058  BP: (!) 155/91   Pulse: 69   Resp: 18   Temp: 98.1 F (36.7 C)   TempSrc: Oral   SpO2: 100% 100%  Weight: 77.6 kg   Height: 5' 11 (1.803 m)     Gen: NAD HENT: NCAT CV: normal heart sounds Lung: CTAB Abd: No TTP, normal bowel sounds MSK: No asymmetry, good bulk and tone Neuro: alert and oriented x 4, CN II-XII intact, good strength in bilateral upper and lower extremities.  Good sensation.  No focal deficits appreciated.  Patient repeats I didn't fall off no truck but is easily  redirectable   Labs on Admission: I have personally reviewed following labs and imaging studies  CBC: Recent Labs  Lab 02/28/24 1955  WBC 6.2  NEUTROABS 3.2  HGB 10.4*  HCT 32.9*  MCV 84.4  PLT 217   Basic Metabolic Panel: Recent Labs  Lab 02/28/24 1955  NA 140  K 3.9  CL 104  CO2 21*  GLUCOSE 86  BUN 19  CREATININE 0.98  CALCIUM  9.3   GFR: Estimated Creatinine Clearance: 73.6 mL/min (by C-G formula based on SCr of 0.98 mg/dL). Liver Function Tests: Recent Labs  Lab 02/28/24 1955  AST 19  ALT 9  ALKPHOS 148*  BILITOT 0.2  PROT 7.2  ALBUMIN 4.1   No results for input(s): LIPASE, AMYLASE in the last 168 hours. No results for input(s): AMMONIA in the last 168 hours. Coagulation Profile: Recent Labs  Lab 02/28/24 1955  INR 1.3*   Cardiac Enzymes: No results for input(s): CKTOTAL, CKMB, CKMBINDEX, TROPONINI, TROPONINIHS in the last 168 hours. BNP (last 3 results) No results for input(s): BNP in the last 8760 hours. HbA1C: No results for input(s): HGBA1C in the last 72 hours. CBG: Recent Labs  Lab 02/28/24 1959  GLUCAP 79   Lipid Profile: No results for input(s):  CHOL, HDL, LDLCALC, TRIG, CHOLHDL, LDLDIRECT in the last 72 hours. Thyroid Function Tests: No results for input(s): TSH, T4TOTAL, FREET4, T3FREE, THYROIDAB in the last 72 hours. Anemia Panel: No results for input(s): VITAMINB12, FOLATE, FERRITIN, TIBC, IRON , RETICCTPCT in the last 72 hours. Urine analysis:    Component Value Date/Time   COLORURINE YELLOW (A) 02/03/2024 2002   APPEARANCEUR CLEAR (A) 02/03/2024 2002   LABSPEC 1.016 02/03/2024 2002   PHURINE 5.0 02/03/2024 2002   GLUCOSEU NEGATIVE 02/03/2024 2002   HGBUR SMALL (A) 02/03/2024 2002   BILIRUBINUR NEGATIVE 02/03/2024 2002   KETONESUR NEGATIVE 02/03/2024 2002   PROTEINUR 100 (A) 02/03/2024 2002   NITRITE NEGATIVE 02/03/2024 2002   LEUKOCYTESUR NEGATIVE 02/03/2024 2002    Radiological Exams on Admission: I have personally reviewed images CT ANGIO HEAD NECK W WO CM (CODE STROKE) Result Date: 02/28/2024 EXAM: CTA Head and Neck with Intravenous Contrast. CLINICAL HISTORY: Neuro deficit, acute, stroke suspected. TECHNIQUE: Axial CTA images of the head and neck performed with intravenous contrast. MIP reconstructed images were created and reviewed. Note: Per PQRS, the description of internal carotid artery percent stenosis, including 0 percent or normal exam, is based on [place] American Symptomatic Carotid Endarterectomy Trial (NASCET) criteria. Dose reduction technique was used including one or more of the following: automated exposure control, adjustment of mA and kV according to patient size, and/or iterative reconstruction. CONTRAST: With; COMPARISON: None provided. FINDINGS: CTA NECK: COMMON CAROTID ARTERIES: No significant stenosis. No dissection or occlusion. INTERNAL CAROTID ARTERIES: No stenosis by NASCET criteria. Mild to moderate right intracranial ICA stenosis. No dissection or occlusion. VERTEBRAL ARTERIES: No significant stenosis. No dissection or occlusion. CTA HEAD: ANTERIOR CEREBRAL ARTERIES:  No significant stenosis. No occlusion. No aneurysm. MIDDLE CEREBRAL ARTERIES: No significant stenosis. No occlusion. No aneurysm. POSTERIOR CEREBRAL ARTERIES: Moderate right p2 PCA stenosis. No occlusion. No aneurysm. BASILAR ARTERY: No significant stenosis. No occlusion. No aneurysm. OTHER: SOFT TISSUES: No acute finding. No masses or lymphadenopathy. BONES: No acute osseous abnormality. IMPRESSION: 1. No large vessel occlusion. 2. Moderate right P2 PCA stenosis. 3. Mild to moderate right intracranial ICA stenosis. Electronically signed by: Gilmore Molt 02/28/2024 08:21 PM EST RP Workstation: [id number]   CT HEAD CODE STROKE WO CONTRAST Result Date:  02/28/2024 EXAM: CT HEAD WITHOUT CONTRAST 02/28/2024 07:34:13 PM TECHNIQUE: CT of the head was performed without the administration of intravenous contrast. Automated exposure control, iterative reconstruction, and/or weight based adjustment of the mA/kV was utilized to reduce the radiation dose to as low as reasonably achievable. COMPARISON: Head CT 02/03/2024 and MRI 08/26/2023. CLINICAL HISTORY: Neuro deficit, acute, stroke suspected. FINDINGS: BRAIN AND VENTRICLES: There is no evidence of an acute infarct, intracranial hemorrhage, mass, midline shift, hydrocephalus, or extra-axial fluid collection. A moderate sized chronic infarct is again noted in the posterior left MCA territory, and there is also an unchanged chronic lacunar infarct in the right thalamus. There is ex vacuo dilatation of the left lateral ventricle. There is mild cerebral atrophy. Mild hypodensities elsewhere in the cerebral white matter bilaterally are nonspecific but compatible with chronic small vessel ischemic disease. Calcified atherosclerosis at the skull base. ORBITS: No acute abnormality. SINUSES: Mild mucosal thickening in the ethmoid sinuses. Clear mastoid air cells. SOFT TISSUES AND SKULL: Small right frontal scalp lipoma. No skull fracture. [place] Stroke Program Early CT Score  (ASPECTS) ----- Ganglionic (caudate, IC, lentiform nucleus, insula, M1-M3): 7 Supraganglionic (M4-M6): 3 Total: 10 These results were communicated by telephone to Dr. Fernand on 02/28/2024 at 7:48 pm. IMPRESSION: 1. No acute intracranial abnormality.  ASPECTS of 10. 2. 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 Electronically signed by: Dasie Hamburg MD 02/28/2024 07:48 PM EST RP Workstation: [id number]    EKG: My personal interpretation of EKG shows: Sinus rhythm without any acute ST changes, bifascicular block that has been previously present.    Assessment/Plan Principal Problem:   Stroke-like symptoms Active Problems:   Atrial fibrillation (HCC)   Alcohol use disorder   Essential hypertension   HLD (hyperlipidemia)   Chronic diastolic CHF (congestive heart failure) (HCC)   CKD (chronic kidney disease), stage III (HCC)   BPH (benign prostatic hyperplasia)   Cerebrovascular accident (CVA) (HCC)   Seizures (HCC)   Stroke versus TIA versus seizure Pt with symptoms of focal weakness with concern for CVA versus TIA versus seizure.  Neurology consulted, appreciate their recommendations.  CT head done, CTA done significant findings.  Restart home antiepileptics and give 3 g IV Keppra  load per neurology. - Allow for permissive HTN in the setting of concern -Continue home Lipitor and ASA - Echocardiogram  - A1C  - Lipid panel  - Tele monitoring  - PT/OT  Alcohol use disorder: States he drinks multiple beers per day.  Not able to articulate if he has withdrawals.  Monitoring on CIWA with Ativan .  If it remains persistently greater than 10, will start phenobarb taper  Hypertension: Allow permissive hypertension in setting of concern for TIA.  History of seizures: No seizure-like activity noted.  On antiepileptics as mentioned above.  Seizure precaution ordered.  Will get EEG.  BPH: Continue home Flomax   VTE prophylaxis:  Xarelto   Diet: N.p.o. Code Status:  Full  Code Telemetry:  Admission status: Observation, Progressive Patient is from: Home Anticipated d/c is to: Home Anticipated d/c is in: 1-2 days   Family Communication: Updated at bedside  Consults called: Neurology   Severity of Illness: The appropriate patient status for this patient is OBSERVATION. Observation status is judged to be reasonable and necessary in order to provide the required intensity of service to ensure the patient's safety. The patient's presenting symptoms, physical exam findings, and initial radiographic and laboratory data in the context of their medical condition is felt to place them at decreased risk for  further clinical deterioration. Furthermore, it is anticipated that the patient will be medically stable for discharge from the hospital within 2 midnights of admission.    Morene Bathe, MD [facility] DEL. [facility]     [1] No Known Allergies  "

## 2024-02-29 ENCOUNTER — Observation Stay

## 2024-02-29 ENCOUNTER — Observation Stay: Admit: 2024-02-29 | Discharge: 2024-02-29 | Disposition: A | Attending: Internal Medicine

## 2024-02-29 DIAGNOSIS — I69351 Hemiplegia and hemiparesis following cerebral infarction affecting right dominant side: Secondary | ICD-10-CM | POA: Diagnosis not present

## 2024-02-29 DIAGNOSIS — F1090 Alcohol use, unspecified, uncomplicated: Secondary | ICD-10-CM

## 2024-02-29 DIAGNOSIS — G40909 Epilepsy, unspecified, not intractable, without status epilepticus: Secondary | ICD-10-CM | POA: Diagnosis present

## 2024-02-29 DIAGNOSIS — I130 Hypertensive heart and chronic kidney disease with heart failure and stage 1 through stage 4 chronic kidney disease, or unspecified chronic kidney disease: Secondary | ICD-10-CM | POA: Diagnosis present

## 2024-02-29 DIAGNOSIS — F1010 Alcohol abuse, uncomplicated: Secondary | ICD-10-CM | POA: Diagnosis present

## 2024-02-29 DIAGNOSIS — N183 Chronic kidney disease, stage 3 unspecified: Secondary | ICD-10-CM | POA: Diagnosis present

## 2024-02-29 DIAGNOSIS — I5032 Chronic diastolic (congestive) heart failure: Secondary | ICD-10-CM

## 2024-02-29 DIAGNOSIS — I4891 Unspecified atrial fibrillation: Secondary | ICD-10-CM

## 2024-02-29 DIAGNOSIS — Y900 Blood alcohol level of less than 20 mg/100 ml: Secondary | ICD-10-CM | POA: Diagnosis present

## 2024-02-29 DIAGNOSIS — G252 Other specified forms of tremor: Secondary | ICD-10-CM | POA: Diagnosis not present

## 2024-02-29 DIAGNOSIS — Z7901 Long term (current) use of anticoagulants: Secondary | ICD-10-CM | POA: Diagnosis not present

## 2024-02-29 DIAGNOSIS — I480 Paroxysmal atrial fibrillation: Secondary | ICD-10-CM | POA: Diagnosis present

## 2024-02-29 DIAGNOSIS — E785 Hyperlipidemia, unspecified: Secondary | ICD-10-CM | POA: Diagnosis present

## 2024-02-29 DIAGNOSIS — R569 Unspecified convulsions: Secondary | ICD-10-CM | POA: Diagnosis not present

## 2024-02-29 DIAGNOSIS — G459 Transient cerebral ischemic attack, unspecified: Secondary | ICD-10-CM | POA: Diagnosis present

## 2024-02-29 DIAGNOSIS — I639 Cerebral infarction, unspecified: Secondary | ICD-10-CM | POA: Diagnosis not present

## 2024-02-29 DIAGNOSIS — Z8673 Personal history of transient ischemic attack (TIA), and cerebral infarction without residual deficits: Secondary | ICD-10-CM | POA: Diagnosis not present

## 2024-02-29 DIAGNOSIS — R2990 Unspecified symptoms and signs involving the nervous system: Secondary | ICD-10-CM

## 2024-02-29 DIAGNOSIS — Z87891 Personal history of nicotine dependence: Secondary | ICD-10-CM | POA: Diagnosis not present

## 2024-02-29 DIAGNOSIS — Z91199 Patient's noncompliance with other medical treatment and regimen due to unspecified reason: Secondary | ICD-10-CM | POA: Diagnosis not present

## 2024-02-29 DIAGNOSIS — R471 Dysarthria and anarthria: Secondary | ICD-10-CM | POA: Diagnosis not present

## 2024-02-29 DIAGNOSIS — I10 Essential (primary) hypertension: Secondary | ICD-10-CM | POA: Diagnosis not present

## 2024-02-29 DIAGNOSIS — Z7982 Long term (current) use of aspirin: Secondary | ICD-10-CM | POA: Diagnosis not present

## 2024-02-29 DIAGNOSIS — F10139 Alcohol abuse with withdrawal, unspecified: Secondary | ICD-10-CM

## 2024-02-29 DIAGNOSIS — N401 Enlarged prostate with lower urinary tract symptoms: Secondary | ICD-10-CM | POA: Diagnosis not present

## 2024-02-29 DIAGNOSIS — R531 Weakness: Secondary | ICD-10-CM | POA: Diagnosis present

## 2024-02-29 DIAGNOSIS — Z79899 Other long term (current) drug therapy: Secondary | ICD-10-CM | POA: Diagnosis not present

## 2024-02-29 DIAGNOSIS — R29810 Facial weakness: Secondary | ICD-10-CM | POA: Diagnosis present

## 2024-02-29 DIAGNOSIS — N400 Enlarged prostate without lower urinary tract symptoms: Secondary | ICD-10-CM | POA: Diagnosis present

## 2024-02-29 DIAGNOSIS — Z8249 Family history of ischemic heart disease and other diseases of the circulatory system: Secondary | ICD-10-CM | POA: Diagnosis not present

## 2024-02-29 DIAGNOSIS — I6621 Occlusion and stenosis of right posterior cerebral artery: Secondary | ICD-10-CM | POA: Diagnosis present

## 2024-02-29 DIAGNOSIS — I6521 Occlusion and stenosis of right carotid artery: Secondary | ICD-10-CM | POA: Diagnosis present

## 2024-02-29 LAB — BASIC METABOLIC PANEL WITH GFR
Anion gap: 9 (ref 5–15)
BUN: 18 mg/dL (ref 8–23)
CO2: 24 mmol/L (ref 22–32)
Calcium: 8.8 mg/dL — ABNORMAL LOW (ref 8.9–10.3)
Chloride: 106 mmol/L (ref 98–111)
Creatinine, Ser: 0.93 mg/dL (ref 0.61–1.24)
GFR, Estimated: >60 mL/min
Glucose, Bld: 76 mg/dL (ref 70–99)
Potassium: 3.7 mmol/L (ref 3.5–5.1)
Sodium: 139 mmol/L (ref 135–145)

## 2024-02-29 LAB — CBC
HCT: 29.1 % — ABNORMAL LOW (ref 39.0–52.0)
Hemoglobin: 9.4 g/dL — ABNORMAL LOW (ref 13.0–17.0)
MCH: 27 pg (ref 26.0–34.0)
MCHC: 32.3 g/dL (ref 30.0–36.0)
MCV: 83.6 fL (ref 80.0–100.0)
Platelets: 192 K/uL (ref 150–400)
RBC: 3.48 MIL/uL — ABNORMAL LOW (ref 4.22–5.81)
RDW: 15.2 % (ref 11.5–15.5)
WBC: 5.3 K/uL (ref 4.0–10.5)
nRBC: 0 % (ref 0.0–0.2)

## 2024-02-29 LAB — ECHOCARDIOGRAM COMPLETE BUBBLE STUDY
AR max vel: 2.52 cm2
AV Area VTI: 2.34 cm2
AV Area mean vel: 2.3 cm2
AV Mean grad: 3 mmHg
AV Peak grad: 5 mmHg
Ao pk vel: 1.12 m/s
Area-P 1/2: 2.81 cm2
MV VTI: 1.89 cm2
S' Lateral: 2.4 cm

## 2024-02-29 LAB — LIPID PANEL
Cholesterol: 159 mg/dL (ref 0–200)
HDL: 96 mg/dL
LDL Cholesterol: 57 mg/dL (ref 0–99)
Total CHOL/HDL Ratio: 1.7 ratio
Triglycerides: 31 mg/dL
VLDL: 6 mg/dL (ref 0–40)

## 2024-02-29 LAB — CBG MONITORING, ED: Glucose-Capillary: 82 mg/dL (ref 70–99)

## 2024-02-29 LAB — HEMOGLOBIN A1C
Hgb A1c MFr Bld: 5.2 % (ref 4.8–5.6)
Mean Plasma Glucose: 102.54 mg/dL

## 2024-02-29 MED ORDER — VITAMIN B-12 1000 MCG PO TABS
1000.0000 ug | ORAL_TABLET | Freq: Every morning | ORAL | Status: DC
  Administered 2024-03-01: 1000 ug via ORAL
  Filled 2024-02-29: qty 1

## 2024-02-29 NOTE — ED Notes (Signed)
 Patient ambulated to the toilet with 1 max assistance with a walker.

## 2024-02-29 NOTE — Assessment & Plan Note (Addendum)
 Lipid panel normal with LDL of 57 - Continue with statin

## 2024-02-29 NOTE — Assessment & Plan Note (Signed)
-   Continue home Vimpat  and Keppra  -EEG was ordered by admitting provider and pending

## 2024-02-29 NOTE — Assessment & Plan Note (Signed)
 Blood pressure mildly elevated. -Home amlodipine  is being held for permissive hypertension at this time.

## 2024-02-29 NOTE — Assessment & Plan Note (Signed)
 Patient was becoming tremulous, requiring Ativan .  Patient drinks beer on a daily basis. - Counseling was provided -Continue with CIWA protocol

## 2024-02-29 NOTE — Assessment & Plan Note (Signed)
 Continue home Flomax

## 2024-02-29 NOTE — Assessment & Plan Note (Signed)
 Clinically appears euvolemic.  Repeat echocardiogram with normal EF and grade 2 diastolic dysfunction. -Monitor volume status

## 2024-02-29 NOTE — Assessment & Plan Note (Signed)
 Initial workup negative for any new stroke, might be TIA.  Again developed some dysarthria, which seems new per family member.  Also concern of psychogenic symptoms. - Ask neurology to reevaluate -PT on reevaluation with new symptoms now recommending SNF. -Continue with aspirin  and statin

## 2024-02-29 NOTE — ED Notes (Signed)
 Attempted to Call back family member in chart to update them on patients plan of care. Patient stated it was okay to give information to family members.

## 2024-02-29 NOTE — ED Notes (Signed)
 CBG

## 2024-02-29 NOTE — Assessment & Plan Note (Signed)
 Heart rate well-controlled, not sure whether he was taking home carvedilol  or not. -Continue home Xarelto 

## 2024-02-29 NOTE — ED Notes (Signed)
 Pt taken to MRI

## 2024-02-29 NOTE — Progress Notes (Signed)
 " Progress Note   Patient: John Spears FMW:[id number] DOB: August 04, 1950 DOA: 02/28/2024     0 DOS: the patient was seen and examined on 02/29/2024   Brief hospital course: Partly taken from H&P.  John Spears is a 74 y.o. year old male with medical history of hypertension, hyperlipidemia, paroxysmal A-fib, CHF (EF 60%, G1 DD in 06/2022), history of CVA with mild right-sided weakness at baseline, alcohol use disorder presenting to the ED when he had right arm weakness.  Symptoms improved on route to ED.  Code stroke was activated.  Patient denies any ongoing smoking or alcohol use.  On presentation vitals and labs stable.CT head without any acute abnormalities, which showed chronic left MCA and right thalamic infarcts. CTA without any large vessel occlusion, moderate right PCA stenosis and mild to moderate right intracranial ICA stenosis.  Neurology was consulted and patient was admitted to complete TIA workup.  1/6: Mildly elevated blood pressure at 151/74, MRI brain was negative for any acute intracranial abnormality, did show a remote left posterior MCA territory infarct.  Lipid panel normal with LDL of 57, A1c of 5.2, echocardiogram with normal EF, grade 2 diastolic dysfunction and no other significant abnormality.  Patient initially worked well with OT and appears to be at baseline, later became more tremulous, more dysarthric and appears to have right sided facial drop which is new per sister.  Asked neurology to reevaluate.  He was also getting Ativan  per CIWA protocol    Assessment and Plan: * Stroke-like symptoms Initial workup negative for any new stroke, might be TIA.  Again developed some dysarthria, which seems new per family member.  Also concern of psychogenic symptoms. - Ask neurology to reevaluate -PT on reevaluation with new symptoms now recommending SNF. -Continue with aspirin  and statin  Alcohol use disorder Patient was becoming tremulous, requiring Ativan .   Patient drinks beer on a daily basis. - Counseling was provided -Continue with CIWA protocol  Atrial fibrillation (HCC) Heart rate well-controlled, not sure whether he was taking home carvedilol  or not. -Continue home Xarelto   Essential hypertension Blood pressure mildly elevated. -Home amlodipine  is being held for permissive hypertension at this time.  Chronic diastolic CHF (congestive heart failure) (HCC) Clinically appears euvolemic.  Repeat echocardiogram with normal EF and grade 2 diastolic dysfunction. -Monitor volume status  CKD (chronic kidney disease), stage III (HCC) CKD stage III is mentioned in his chart but labs consistent with normal GFR above 60, might be CKD stage II.  Currently renal function at baseline. -Monitor renal function  HLD (hyperlipidemia) Lipid panel normal with LDL of 57 - Continue with statin  BPH (benign prostatic hyperplasia) - Continue home Flomax   Seizures (HCC) - Continue home Vimpat  and Keppra  -EEG was ordered by admitting provider and pending   Subjective: Patient was having some dysarthria and feeling weak.  Unable to explain any specific weakness.  Sister at bedside stating that his speech is different from his baseline.  He also received Ativan  earlier.  Physical Exam: Vitals:   02/29/24 1214 02/29/24 1250 02/29/24 1253 02/29/24 1355  BP: (!) 167/78 (!) 152/64  (!) 148/62  Pulse: (!) 106 (!) 50  (!) 50  Resp:  14    Temp:   98.4 F (36.9 C)   TempSrc:   Oral   SpO2:  100%    Weight:      Height:       General.  Malnourished gentleman, in no acute distress. Pulmonary.  Lungs clear bilaterally, normal  respiratory effort. CV.  Regular rate and rhythm, no JVD, rub or murmur. Abdomen.  Soft, nontender, nondistended, BS positive. CNS.  Alert and oriented .  Mild right-sided facial droop, not sure whether it was chronic or new.  Mild right upper extremity weakness. Extremities.  No edema, pulses intact and symmetrical.  Data  Reviewed: Prior data reviewed  Family Communication: Discussed with sister at bedside  Disposition: Status is: Inpatient Remains inpatient appropriate because: Severity of illness  Planned Discharge Destination: Skilled nursing facility  DVT prophylaxis.  Xarelto  Time spent: 50 minutes  This record has been created using Conservation officer, historic buildings. Errors have been sought and corrected,but may not always be located. Such creation errors do not reflect on the standard of care.   Author: Amaryllis Dare, MD 02/29/2024 4:18 PM  For on call review [email or web address].  "

## 2024-02-29 NOTE — TOC CM/SW Note (Signed)
 TOC consult received for substance abuse education/counseling. TOC does not provide counseling. Resources have been added to the AVS.

## 2024-02-29 NOTE — ED Notes (Signed)
 Caleen, MD in room.

## 2024-02-29 NOTE — Progress Notes (Signed)
 NEUROLOGY CONSULT FOLLOW UP NOTE   Date of service: February 29, 2024 Patient Name: John Spears MRN:  [id number] DOB:  [birth year]  Interval Hx/subjective  The patient continues to have tremors. Per Dr. Rebbecca note, the patient initially worked well with OT and appeared to be at baseline, but later became more tremulous, more dysarthric and appeared to have right sided facial droop which was new per sister. He is on Ativan  per CIWA protocol   Vitals   Vitals:   02/29/24 1355 02/29/24 1708 02/29/24 1811 02/29/24 1959  BP: (!) 148/62  (!) 180/72 (!) 161/73  Pulse: (!) 50  (!) 59 (!) 56  Resp:   18 18  Temp:  98.7 F (37.1 C) 98.1 F (36.7 C) 98 F (36.7 C)  TempSrc:  Oral    SpO2:   100% 100%  Weight:      Height:         Body mass index is 23.85 kg/m.  Physical Exam   Constitutional: Appears well-developed and well-nourished.  Psych: Blunted affect  Eyes: No scleral injection.  HENT: No OP obstrucion.  Head: Normocephalic.  Respiratory: Effort normal, non-labored breathing.    Neurologic Examination   Mental Status: Awake with decreased level of alertness. Fully oriented. Abulic with blunted affect. Decreased attention. Speech is sparse but fluent without evidence of aphasia.  Able to follow all commands without difficulty. Cranial Nerves: II, III,IV, VI: Fixates and tracks to left and right. No ptosis. EOMI. No nystagmus. VII: Smile symmetric VIII: Hearing intact to voice IX,X: No hypophonia or hoarseness XI: Symmetric XII: Midline tongue extension Motor: RUE: 5/5 LUE: 5/5 RLE: 5/5 LLE: 5/5 Sensory: Intact to gross touch x 4.  Cerebellar: No ataxia with FNF bilaterally Gait: Deferred  Medications Current Medications[1]  Labs and Diagnostic Imaging   CBC:  Recent Labs  Lab 02/28/24 1955 02/29/24 0453  WBC 6.2 5.3  NEUTROABS 3.2  --   HGB 10.4* 9.4*  HCT 32.9* 29.1*  MCV 84.4 83.6  PLT 217 192    Basic Metabolic Panel:  Lab Results   Component Value Date   NA 139 02/29/2024   K 3.7 02/29/2024   CO2 24 02/29/2024   GLUCOSE 76 02/29/2024   BUN 18 02/29/2024   CREATININE 0.93 02/29/2024   CALCIUM  8.8 (L) 02/29/2024   GFRNONAA >60 02/29/2024   GFRAA >60 04/04/2019   Lipid Panel:  Lab Results  Component Value Date   LDLCALC 57 02/29/2024   HgbA1c:  Lab Results  Component Value Date   HGBA1C 5.2 02/28/2024   Urine Drug Screen:     Component Value Date/Time   LABOPIA NONE DETECTED 08/26/2023 0445   LABOPIA NONE DETECTED 01/27/2023 1800   COCAINSCRNUR NONE DETECTED 08/26/2023 0445   LABBENZ POSITIVE (A) 08/26/2023 0445   LABBENZ POSITIVE (A) 01/27/2023 1800   AMPHETMU NONE DETECTED 08/26/2023 0445   AMPHETMU NONE DETECTED 01/27/2023 1800   THCU NONE DETECTED 08/26/2023 0445   THCU NONE DETECTED 01/27/2023 1800   LABBARB NONE DETECTED 08/26/2023 0445   LABBARB POSITIVE (A) 01/27/2023 1800    Alcohol Level     Component Value Date/Time   [facility] <15 02/28/2024 1955   INR  Lab Results  Component Value Date   INR 1.3 (H) 02/28/2024   APTT  Lab Results  Component Value Date   APTT 36 02/28/2024   AED levels:  Lab Results  Component Value Date   PHENYTOIN  11.9 02/02/2023   LEVETIRACETA 85.9 (H) 08/26/2023  Assessment  John Spears is a 74 y.o. male very well-known to our service with a PMHx of EtOH abuse, atrial fibrillation, HF with improved EF, left parietal lobe cortically-based ischemic infarction with mild right hemiparesis at baseline, HTN, atrial fibrillation (on Eliquis ) and seizures with multiple episodes of breakthrough seizures due to noncompliance (on Vimpat  and Keppra ) who presented to the hospital on Monday after sudden onset of RUE weakness. He had missed 1-2 days of all of his medications due to delayed delivery to his home of his pre-packaged medication tray.  - Exam reveals continued diffuse tremulousness. Orientation is improved today. Anxious affect is resolved relative  to yesterday's exam.  - MRI brain: No acute intracranial abnormality. Remote left posterior MCA territory infarct. - CTA of head and neck: No large vessel occlusion. Moderate right P2 PCA stenosis. Mild to moderate right intracranial ICA stenosis. - Impression:  - Acute onset of stuttering right sided weakness. DDx for underlying etiology includes TIA and psychogenic pseudostroke. The patient may also be withdrawing from EtOH given his tremulousness.  - Recurrence of neurological symptoms today, consisting of increasing tremors, worsened dysarthria and possible right sided facial droop. No new neurological deficits are seen on repeat exam this evening.  - Continued tremor, appearing most consistent with EtOH withdrawal.   Recommendations  - Continue CIWA protocol with PRN Ativan  given his history of heavy EtOH use and continued tremulousness today. May need a slightly higher PRN dose.   - Continue his home Vimpat  200 mg BID and Keppra  1500 mg BID.  - Continued cardiac telemetry - PT/OT/Speech - Inpatient seizure precautions   ______________________________________________________________________   Bonney SHARK, Monick Rena, MD [facility]     [1]  Current Facility-Administered Medications:    acetaminophen  (TYLENOL ) tablet 650 mg, 650 mg, Oral, Q6H PRN, 650 mg at 02/29/24 1008 **OR** acetaminophen  (TYLENOL ) suppository 650 mg, 650 mg, Rectal, Q6H PRN, Fernand Prost, MD   aspirin  EC tablet 81 mg, 81 mg, Oral, Daily, Fernand Prost, MD, 81 mg at 02/29/24 1009   atorvastatin  (LIPITOR) tablet 40 mg, 40 mg, Oral, QHS, Khan, Ghalib, MD, 40 mg at 02/29/24 0005   [START ON 03/01/2024] cyanocobalamin  (VITAMIN B12) tablet 1,000 mcg, 1,000 mcg, Oral, q AM, Amin, Sumayya, MD   folic acid  (FOLVITE ) tablet 1 mg, 1 mg, Oral, Daily, Fernand Prost, MD, 1 mg at 02/29/24 1009   lacosamide  (VIMPAT ) tablet 200 mg, 200 mg, Oral, BID, Khan, Ghalib, MD, 200 mg at 02/29/24 1008   levETIRAcetam  (KEPPRA )  tablet 1,500 mg, 1,500 mg, Oral, BID, Khan, Ghalib, MD, 1,500 mg at 02/29/24 1008   LORazepam  (ATIVAN ) tablet 1-4 mg, 1-4 mg, Oral, Q1H PRN, 1 mg at 02/29/24 1252 **OR** LORazepam  (ATIVAN ) injection 1-4 mg, 1-4 mg, Intravenous, Q1H PRN, Fernand Prost, MD, 2 mg at 02/29/24 1111   multivitamin with minerals tablet 1 tablet, 1 tablet, Oral, Daily, Fernand Prost, MD, 1 tablet at 02/29/24 1008   ondansetron  (ZOFRAN ) tablet 4 mg, 4 mg, Oral, Q6H PRN **OR** ondansetron  (ZOFRAN ) injection 4 mg, 4 mg, Intravenous, Q6H PRN, Fernand Prost, MD   rivaroxaban  (XARELTO ) tablet 20 mg, 20 mg, Oral, Q supper, Fernand Rossie HERO, MD, 20 mg at 02/29/24 1707   senna-docusate (Senokot-S) tablet 1 tablet, 1 tablet, Oral, QHS PRN, Fernand Prost, MD   sodium chloride  flush (NS) 0.9 % injection 3 mL, 3 mL, Intravenous, Q12H, Fernand Prost, MD, 3 mL at 02/29/24 1011   tamsulosin  (FLOMAX ) capsule 0.4 mg, 0.4 mg, Oral, QHS, Fernand Prost, MD, 0.4  mg at 02/29/24 0005   thiamine  (VITAMIN B1) tablet 100 mg, 100 mg, Oral, Daily, 100 mg at 02/29/24 1008 **OR** thiamine  (VITAMIN B1) injection 100 mg, 100 mg, Intravenous, Daily, Fernand Prost, MD

## 2024-02-29 NOTE — Progress Notes (Signed)
*  PRELIMINARY RESULTS* Echocardiogram 2D Echocardiogram has been performed.  John Spears 02/29/2024, 11:35 AM

## 2024-02-29 NOTE — Evaluation (Signed)
 Occupational Therapy Evaluation Patient Details Name: John Spears MRN: [id number] DOB: 05-09-1950 Today's Date: 02/29/2024   History of Present Illness   Pt is a 74 y/o M admitted on 02/28/24 after presenting with c/o R arm weakness. MRI negative for acute issues. PMH: HTN, HLD, paroxysmal a-fib, CHF, CVA with mild R sided weakness, alcohol use disorder, seizure disorder     Clinical Impressions Patient presenting with decreased Ind in self care, balance, functional mobility, transfers, endurance, and safety awareness. Patient reports being Mod I at baseline with use of RW for mobility and living at home with sister. Pt endorses ~ 5-6 falls in the last 6 months. Per chart, last fall a month ago resulting in L pelvic fx. Pt performs bed mobility with supervision. He stands with min guard and ambulates with RW and supervision in hallway and to bathroom for toileting needs progressing to supervision overall.  Patient will benefit from acute OT to increase overall independence in the areas of ADLs, functional mobility, and safety awareness in order to safely discharge.     If plan is discharge home, recommend the following:   A little help with walking and/or transfers;A little help with bathing/dressing/bathroom;Assist for transportation;Help with stairs or ramp for entrance     Functional Status Assessment   Patient has had a recent decline in their functional status and demonstrates the ability to make significant improvements in function in a reasonable and predictable amount of time.     Equipment Recommendations   None recommended by OT      Precautions/Restrictions   Precautions Precautions: Fall Precaution/Restrictions Comments: hx of seizures, currently on CIWA Restrictions Weight Bearing Restrictions Per Provider Order: No     Mobility Bed Mobility Overal bed mobility: Needs Assistance Bed Mobility: Supine to Sit, Sit to Supine     Supine to sit: Used  rails, Supervision, HOB elevated Sit to supine: Supervision, HOB elevated, Used rails        Transfers Overall transfer level: Needs assistance Equipment used: Rolling walker (2 wheels) Transfers: Sit to/from Stand Sit to Stand: Contact guard assist                  Balance Overall balance assessment: Needs assistance Sitting-balance support: Feet supported Sitting balance-Leahy Scale: Good     Standing balance support: During functional activity, Reliant on assistive device for balance, Bilateral upper extremity supported Standing balance-Leahy Scale: Poor                             ADL either performed or assessed with clinical judgement   ADL Overall ADL's : Needs assistance/impaired     Grooming: Wash/dry hands;Standing;Supervision/safety                   Toilet Transfer: Contact guard assist;Rolling walker (2 wheels)   Toileting- Clothing Manipulation and Hygiene: Supervision/safety       Functional mobility during ADLs: Supervision/safety;Contact guard assist;Rolling walker (2 wheels)       Vision Patient Visual Report: No change from baseline              Pertinent Vitals/Pain Pain Assessment Pain Assessment: Faces Faces Pain Scale: Hurts little more Pain Location: R hip & shoulder Pain Descriptors / Indicators: Discomfort Pain Intervention(s): Monitored during session, Limited activity within patient's tolerance, Patient requesting pain meds-RN notified     Extremity/Trunk Assessment Upper Extremity Assessment Upper Extremity Assessment: Generalized weakness;RUE deficits/detail RUE Deficits / Details:  hx of prior CVA with mild R weakness   Lower Extremity Assessment Lower Extremity Assessment: Generalized weakness   Cervical / Trunk Assessment Cervical / Trunk Assessment:  (tremulous movements in head & extremities)   Communication Communication Communication: Impaired Factors Affecting Communication: Difficulty  expressing self;Reduced clarity of speech   Cognition Arousal: Alert Behavior During Therapy: WFL for tasks assessed/performed Cognition: History of cognitive impairments                               Following commands: Impaired Following commands impaired: Follows multi-step commands with increased time     Cueing  General Comments   Cueing Techniques: Verbal cues  pt with c/o dizziness & HA after gait, BP in LUE 182/82 mmHg MAP 113           Home Living Family/patient expects to be discharged to:: Private residence Living Arrangements: Other relatives (sister) Available Help at Discharge: Family;Available 24 hours/day Type of Home: House Home Access: Stairs to enter [employer] of Steps: 3 Entrance Stairs-Rails: Right Home Layout: One level     Bathroom Shower/Tub: Walk-in shower;Sponge bathes at baseline   [employer]: Standard Bathroom Accessibility: Yes   Home Equipment: Shower seat;Grab bars - tub/shower;Rolling Walker (2 wheels)   Additional Comments: Acute fractures of the left superior and inferior pubic rami from fall on 02/04/24      Prior Functioning/Environment Prior Level of Function : History of Falls (last six months);Needs assist             Mobility Comments: ambulates without AD in the home, RW outside of the home, has fallen but not in the past month ADLs Comments: Pt reports being ind with self care tasks and sister performs all IADLs. She takes him to appointments.    OT Problem List: Decreased strength;Decreased knowledge of use of DME or AE;Decreased activity tolerance;Impaired balance (sitting and/or standing);Decreased safety awareness   OT Treatment/Interventions: Self-care/ADL training;Balance training;Therapeutic exercise;Therapeutic activities;Energy conservation;Cognitive remediation/compensation;Patient/family education;DME and/or AE instruction      OT Goals(Current goals can be found in the care  plan section)   Acute Rehab OT Goals Patient Stated Goal: to go home OT Goal Formulation: With patient Time For Goal Achievement: 03/14/24 Potential to Achieve Goals: Fair ADL Goals Pt Will Perform Grooming: with modified independence;standing Pt Will Perform Lower Body Dressing: with modified independence;sit to/from stand Pt Will Transfer to Toilet: with modified independence;ambulating Pt Will Perform Toileting - Clothing Manipulation and hygiene: with modified independence;sit to/from stand   OT Frequency:  Min 2X/week       AM-PAC OT 6 Clicks Daily Activity     Outcome Measure Help from another person eating meals?: None Help from another person taking care of personal grooming?: None Help from another person toileting, which includes using toliet, bedpan, or urinal?: A Little Help from another person bathing (including washing, rinsing, drying)?: A Little Help from another person to put on and taking off regular upper body clothing?: None Help from another person to put on and taking off regular lower body clothing?: A Little 6 Click Score: 21   End of Session Equipment Utilized During Treatment: Rolling walker (2 wheels) Nurse Communication: Patient requests pain meds  Activity Tolerance: Patient tolerated treatment well Patient left: in bed;with call bell/phone within reach;with bed alarm set  OT Visit Diagnosis: Unsteadiness on feet (R26.81);Repeated falls (R29.6);History of falling (Z91.81)  Time: 0933-1000 OT Time Calculation (min): 27 min Charges:  OT General Charges $OT Visit: 1 Visit OT Evaluation $OT Eval Low Complexity: 1 Low OT Treatments $Self Care/Home Management : 8-22 mins  Izetta Claude, MS, OTR/L , CBIS ascom [phone]  02/29/2024, 12:03 PM

## 2024-02-29 NOTE — Procedures (Signed)
 Patient Name: John Spears  MRN: [id number]  Epilepsy Attending: Arlin MALVA Krebs  Referring Physician/Provider: Fernand Prost, MD  Date: 03/01/2024 Duration: 28.16 mins  Patient history: 74yo M with multiple episodes of breakthrough seizures. EEG to evaluate for seizure   Level of alertness: Awake, asleep  AEDs during EEG study: LEV, LCM, Ativan   Technical aspects: This EEG study was done with scalp electrodes positioned according to the 10-20 International system of electrode placement. Electrical activity was reviewed with band pass filter of 1-70Hz , sensitivity of 7 uV/mm, display speed of 65mm/sec with a 60Hz  notched filter applied as appropriate. EEG data were recorded continuously and digitally stored.  Video monitoring was available and reviewed as appropriate.  Description: The posterior dominant rhythm consists of 8 Hz activity of moderate voltage (25-35 uV) seen predominantly in posterior head regions, symmetric and reactive to eye opening and eye closing. Sleep was characterized by vertex waves, sleep spindles (12 to 14 Hz), maximal frontocentral region.  There is continuous 3 to 6 Hz theta-delta slowing in left hemisphere. Hyperventilation and photic stimulation were not performed.     ABNORMALITY - Continuous slow,  left hemisphere  IMPRESSION: This study is suggestive of cortical dysfunction arising from left hemisphere likely secondary to underlying structural abnormality. No seizures or epileptiform discharges were seen throughout the recording.  Deah Ottaway O Chanel Mckesson

## 2024-02-29 NOTE — Hospital Course (Addendum)
 Partly taken from H&P.  John Spears is a 74 y.o. year old male with medical history of hypertension, hyperlipidemia, paroxysmal A-fib, CHF (EF 60%, G1 DD in 06/2022), history of CVA with mild right-sided weakness at baseline, alcohol use disorder presenting to the ED when he had right arm weakness.  Symptoms improved on route to ED.  Code stroke was activated.  Patient denies any ongoing smoking or alcohol use.  On presentation vitals and labs stable.CT head without any acute abnormalities, which showed chronic left MCA and right thalamic infarcts. CTA without any large vessel occlusion, moderate right PCA stenosis and mild to moderate right intracranial ICA stenosis.  Neurology was consulted and patient was admitted to complete TIA workup.  1/6: Mildly elevated blood pressure at 151/74, MRI brain was negative for any acute intracranial abnormality, did show a remote left posterior MCA territory infarct.  Lipid panel normal with LDL of 57, A1c of 5.2, echocardiogram with normal EF, grade 2 diastolic dysfunction and no other significant abnormality.  Patient initially worked well with OT and appears to be at baseline, later became more tremulous, more dysarthric and appears to have right sided facial drop which is new per sister.  Asked neurology to reevaluate.  He was also getting Ativan  per CIWA protocol.  There was no change in his exam on reevaluation by neurology so they did not recommend any reimaging.  1/7: Hemodynamically stable, less tremulous and physically improved.  PT recommendations no change to home health which was ordered. EEG with some left hemisphere slowing which is consistent with his prior deficit.  No seizure-like activity.  Patient will continue with his current medications and need to have a close follow-up with his providers for further assistance.

## 2024-02-29 NOTE — Evaluation (Signed)
 Physical Therapy Evaluation Patient Details Name: John Spears MRN: [id number] DOB: 1950-11-07 Today's Date: 02/29/2024  History of Present Illness  Pt is a 74 y/o M admitted on 02/28/24 after presenting with c/o R arm weakness. MRI negative for acute issues. PMH: HTN, HLD, paroxysmal a-fib, CHF, CVA with mild R sided weakness, alcohol use disorder, seizure disorder  Clinical Impression  Pt seen for PT evaluation with pt agreeable, sister arriving midway through session. Pt reports he ambulates with RW but sister reports he ambulates without AD in the home, RW outside of the home, is independent with toileting. On this date, pt requires UE support for safe ambulation. Ambulation distance limited by fatigue & pt with c/o dizziness & HA afterwards. Pt's sister reports she cannot provide any physical assistance at d/c. Recommend ongoing PT services to progress mobility & reduce fall risk.        If plan is discharge home, recommend the following: A little help with walking and/or transfers;A little help with bathing/dressing/bathroom;Assistance with cooking/housework;Assist for transportation;Help with stairs or ramp for entrance;Direct supervision/assist for financial management   Can travel by private vehicle   Yes    Equipment Recommendations Rolling walker (2 wheels)  Recommendations for Other Services       Functional Status Assessment Patient has had a recent decline in their functional status and demonstrates the ability to make significant improvements in function in a reasonable and predictable amount of time.     Precautions / Restrictions Precautions Precautions: Fall Precaution/Restrictions Comments: hx of seizures, currently on CIWA Restrictions Weight Bearing Restrictions Per Provider Order: No      Mobility  Bed Mobility Overal bed mobility: Needs Assistance Bed Mobility: Supine to Sit, Sit to Supine     Supine to sit: Used rails, Supervision, HOB elevated (exit  R side of bed) Sit to supine: Supervision, HOB elevated, Used rails        Transfers Overall transfer level: Needs assistance Equipment used: Rolling walker (2 wheels), 1 person hand held assist Transfers: Sit to/from Stand Sit to Stand: Contact guard assist           General transfer comment: sit>stand from EOB with RW & pt requiring significantly extra time, leaning posteriorly on EOB with BLE, & pulling on RW to transfer sit>stand. Pt transfers later without AD but reaching for UE support 2/2 impaired balance.    Ambulation/Gait Ambulation/Gait assistance: Contact guard assist Gait Distance (Feet): 11 Feet Assistive device: Rolling walker (2 wheels) Gait Pattern/deviations: Decreased step length - left, Decreased step length - right, Narrow base of support, Shuffle Gait velocity: significantly decreased     General Gait Details: ambulates to door & back with RW, reports fatigue, limiting gait distance  Stairs            Wheelchair Mobility     Tilt Bed    Modified Rankin (Stroke Patients Only)       Balance Overall balance assessment: Needs assistance Sitting-balance support: Feet supported Sitting balance-Leahy Scale: Fair     Standing balance support: During functional activity, Reliant on assistive device for balance, No upper extremity supported Standing balance-Leahy Scale: Poor                               Pertinent Vitals/Pain Pain Assessment Pain Assessment: Faces Faces Pain Scale: Hurts little more Pain Location: R hip & shoulder Pain Descriptors / Indicators: Discomfort Pain Intervention(s): Monitored during session, Limited activity  within patient's tolerance    Home Living Family/patient expects to be discharged to:: Private residence Living Arrangements: Other relatives (sister) Available Help at Discharge: Family;Available 24 hours/day Type of Home: House Home Access: Stairs to enter Entrance Stairs-Rails:  Right Entrance Stairs-Number of Steps: 3   Home Layout: One level Home Equipment: Shower seat;Grab bars - tub/shower;Rolling Walker (2 wheels)      Prior Function Prior Level of Function : History of Falls (last six months);Needs assist             Mobility Comments: ambulates without AD in the home, RW outside of the home, has fallen but not in the past month ADLs Comments: Pt reports being ind with self care tasks and sister performs all IADLs. She takes him to appointments.     Extremity/Trunk Assessment   Upper Extremity Assessment Upper Extremity Assessment: Generalized weakness (hx of CVA with residual mild R sided weakness)    Lower Extremity Assessment Lower Extremity Assessment: Generalized weakness (hx of CVA with residual mild R sided weakness)    Cervical / Trunk Assessment Cervical / Trunk Assessment:  (tremulous movements in head & extremities)  Communication   Communication Communication: Impaired Factors Affecting Communication: Difficulty expressing self;Reduced clarity of speech    Cognition Arousal: Alert     PT - Cognitive impairments: History of cognitive impairments                       PT - Cognition Comments: Pt with hx of cognitive deficits sister reports are baseline. Oriented to name, birthday but not age (reports he's 74 y/o), unable to report the correct year but reports correct month. Following commands: Impaired Following commands impaired: Follows multi-step commands with increased time     Cueing Cueing Techniques: Verbal cues     General Comments General comments (skin integrity, edema, etc.): pt with c/o dizziness & HA after gait, BP in LUE 182/82 mmHg MAP 113    Exercises     Assessment/Plan    PT Assessment Patient needs continued PT services  PT Problem List Decreased strength;Decreased coordination;Pain;Decreased cognition;Decreased activity tolerance;Decreased balance;Decreased mobility;Decreased range of  motion;Decreased knowledge of use of DME;Decreased safety awareness       PT Treatment Interventions DME instruction;Therapeutic exercise;Gait training;Balance training;Stair training;Neuromuscular re-education;Functional mobility training;Patient/family education;Therapeutic activities;Cognitive remediation    PT Goals (Current goals can be found in the Care Plan section)  Acute Rehab PT Goals Patient Stated Goal: get better PT Goal Formulation: With patient/family Time For Goal Achievement: 03/14/24 Potential to Achieve Goals: Good    Frequency Min 3X/week     Co-evaluation               AM-PAC PT 6 Clicks Mobility  Outcome Measure Help needed turning from your back to your side while in a flat bed without using bedrails?: None Help needed moving from lying on your back to sitting on the side of a flat bed without using bedrails?: A Little Help needed moving to and from a bed to a chair (including a wheelchair)?: A Little Help needed standing up from a chair using your arms (e.g., wheelchair or bedside chair)?: A Little Help needed to walk in hospital room?: A Little Help needed climbing 3-5 steps with a railing? : A Little 6 Click Score: 19    End of Session   Activity Tolerance: Patient limited by fatigue Patient left: in bed;with bed alarm set;with call bell/phone within reach;with family/visitor present Nurse Communication: Mobility status (  c/o symptoms during session) PT Visit Diagnosis: Unsteadiness on feet (R26.81);Muscle weakness (generalized) (M62.81);Difficulty in walking, not elsewhere classified (R26.2);Other abnormalities of gait and mobility (R26.89)    Time: 8958-8941 PT Time Calculation (min) (ACUTE ONLY): 17 min   Charges:   PT Evaluation $PT Eval Low Complexity: 1 Low   PT General Charges $$ ACUTE PT VISIT: 1 Visit         Richerd Pinal, PT, DPT 02/29/2024, 11:47 AM   Richerd CHRISTELLA Pinal 02/29/2024, 11:46 AM

## 2024-02-29 NOTE — Assessment & Plan Note (Signed)
 CKD stage III is mentioned in his chart but labs consistent with normal GFR above 60, might be CKD stage II.  Currently renal function at baseline. -Monitor renal function

## 2024-03-01 ENCOUNTER — Inpatient Hospital Stay

## 2024-03-01 DIAGNOSIS — R2990 Unspecified symptoms and signs involving the nervous system: Secondary | ICD-10-CM | POA: Diagnosis not present

## 2024-03-01 DIAGNOSIS — G459 Transient cerebral ischemic attack, unspecified: Secondary | ICD-10-CM | POA: Diagnosis not present

## 2024-03-01 DIAGNOSIS — R569 Unspecified convulsions: Secondary | ICD-10-CM | POA: Diagnosis not present

## 2024-03-01 DIAGNOSIS — I4891 Unspecified atrial fibrillation: Secondary | ICD-10-CM | POA: Diagnosis not present

## 2024-03-01 DIAGNOSIS — E785 Hyperlipidemia, unspecified: Secondary | ICD-10-CM

## 2024-03-01 DIAGNOSIS — F1090 Alcohol use, unspecified, uncomplicated: Secondary | ICD-10-CM | POA: Diagnosis not present

## 2024-03-01 LAB — GLUCOSE, CAPILLARY: Glucose-Capillary: 103 mg/dL — ABNORMAL HIGH (ref 70–99)

## 2024-03-01 MED ORDER — CARVEDILOL 6.25 MG PO TABS
12.5000 mg | ORAL_TABLET | Freq: Two times a day (BID) | ORAL | Status: DC
  Administered 2024-03-01: 12.5 mg via ORAL
  Filled 2024-03-01: qty 2

## 2024-03-01 MED ORDER — AMLODIPINE BESYLATE 10 MG PO TABS: 10.0000 mg | ORAL_TABLET | Freq: Every day | ORAL | Status: DC

## 2024-03-01 MED ORDER — ENSURE ENLIVE PO LIQD
237.0000 mL | Freq: Two times a day (BID) | ORAL | Status: DC
  Administered 2024-03-01 (×2): 237 mL via ORAL

## 2024-03-01 NOTE — Discharge Summary (Signed)
 " Physician Discharge Summary   Patient: John Spears MRN: [id number] DOB: Jul 19, 1950  Admit date:     02/28/2024  Discharge date: 03/01/2024  Discharge Physician: Amaryllis Dare   PCP: Patient, No Pcp Per   Recommendations at discharge:  Please obtain CBC and BMP on follow-up Follow-up with primary care provider Follow-up with neurology  Discharge Diagnoses: Principal Problem:   Stroke-like symptoms Active Problems:   Alcohol use disorder   Atrial fibrillation (HCC)   Essential hypertension   Chronic diastolic CHF (congestive heart failure) (HCC)   CKD (chronic kidney disease), stage III (HCC)   HLD (hyperlipidemia)   BPH (benign prostatic hyperplasia)   Seizures (HCC)   Cerebrovascular accident (CVA) (HCC)   TIA (transient ischemic attack)   Hospital Course: Partly taken from H&P.  John Spears is a 74 y.o. year old male with medical history of hypertension, hyperlipidemia, paroxysmal A-fib, CHF (EF 60%, G1 DD in 06/2022), history of CVA with mild right-sided weakness at baseline, alcohol use disorder presenting to the ED when he had right arm weakness.  Symptoms improved on route to ED.  Code stroke was activated.  Patient denies any ongoing smoking or alcohol use.  On presentation vitals and labs stable.CT head without any acute abnormalities, which showed chronic left MCA and right thalamic infarcts. CTA without any large vessel occlusion, moderate right PCA stenosis and mild to moderate right intracranial ICA stenosis.  Neurology was consulted and patient was admitted to complete TIA workup.  1/6: Mildly elevated blood pressure at 151/74, MRI brain was negative for any acute intracranial abnormality, did show a remote left posterior MCA territory infarct.  Lipid panel normal with LDL of 57, A1c of 5.2, echocardiogram with normal EF, grade 2 diastolic dysfunction and no other significant abnormality.  Patient initially worked well with OT and appears to be at  baseline, later became more tremulous, more dysarthric and appears to have right sided facial drop which is new per sister.  Asked neurology to reevaluate.  He was also getting Ativan  per CIWA protocol.  There was no change in his exam on reevaluation by neurology so they did not recommend any reimaging.  1/7: Hemodynamically stable, less tremulous and physically improved.  PT recommendations no change to home health which was ordered. EEG with some left hemisphere slowing which is consistent with his prior deficit.  No seizure-like activity.  Patient will continue with his current medications and need to have a close follow-up with his providers for further assistance.  Assessment and Plan: * Stroke-like symptoms Initial workup negative for any new stroke, might be TIA.  Again developed some dysarthria, which seems new per family member.  Also concern of psychogenic symptoms. No change in exam on reevaluation by neurology. PT is not recommending home health which was ordered Completed the stroke workup with no significant new abnormality. Patient will continue with aspirin  and statin and need to have a close follow-up with his outpatient neurologist.  Alcohol use disorder Patient was becoming tremulous, requiring Ativan .  Patient drinks beer on a daily basis. - Counseling was provided Did receive some Ativan  per CIWA protocol  Atrial fibrillation (HCC) Heart rate well-controlled, not sure whether he was taking home carvedilol  or not. -Continue home Xarelto  and carvedilol   Essential hypertension Blood pressure mildly elevated. Home amlodipine  was initially held and later resumed as there was no new stroke detected on MRI.  Chronic diastolic CHF (congestive heart failure) (HCC) Clinically appears euvolemic.  Repeat echocardiogram with normal EF  and grade 2 diastolic dysfunction. -Monitor volume status  CKD (chronic kidney disease), stage III (HCC) CKD stage III is mentioned in his  chart but labs consistent with normal GFR above 60, might be CKD stage II.  Currently renal function at baseline. -Monitor renal function  HLD (hyperlipidemia) Lipid panel normal with LDL of 57 - Continue with statin  BPH (benign prostatic hyperplasia) - Continue home Flomax   Seizures (HCC) - Continue home Vimpat  and Keppra  -EEG with slowing in left hemisphere which was consistent with his prior deficit, no seizure-like activity noted  Consultants: Neurology Procedures performed: EEG Disposition: Home health Diet recommendation:  Cardiac and Carb modified diet DISCHARGE MEDICATION: Allergies as of 03/01/2024   No Known Allergies      Medication List     STOP taking these medications    benzoyl peroxide 5 % gel   Multi-Vitamin tablet   oxyCODONE  5 MG immediate release tablet Commonly known as: Oxy IR/ROXICODONE        TAKE these medications    acetaminophen  500 MG tablet Commonly known as: TYLENOL  Take 1 tablet (500 mg total) by mouth every 6 (six) hours as needed for moderate pain (pain score 4-6).   amLODipine  10 MG tablet Commonly known as: NORVASC  Take 10 mg by mouth at bedtime.   Aspirin  Low Dose 81 MG tablet Generic drug: aspirin  EC Take 81 mg by mouth daily.   atorvastatin  40 MG tablet Commonly known as: LIPITOR Take 40 mg by mouth at bedtime.   carvedilol  12.5 MG tablet Commonly known as: COREG  Take 1 tablet (12.5 mg total) by mouth 2 (two) times daily. What changed: Another medication with the same name was removed. Continue taking this medication, and follow the directions you see here.   cyanocobalamin  1000 MCG tablet Commonly known as: VITAMIN B12 Take 1,000 mcg by mouth in the morning.   feeding supplement Liqd Take 237 mLs by mouth 2 (two) times daily between meals.   folic acid  1 MG tablet Commonly known as: FOLVITE  Take 1 mg by mouth in the morning.   furosemide  20 MG tablet Commonly known as: LASIX  TAKE 1 TABLET BY MOUTH ONCE A  DAY   gabapentin  100 MG capsule Commonly known as: NEURONTIN  Take 100 mg by mouth 2 (two) times daily. What changed: Another medication with the same name was removed. Continue taking this medication, and follow the directions you see here.   hydrocortisone 2.5 % cream Apply 1 Application topically 2 (two) times daily as needed.   lacosamide  200 MG Tabs tablet Commonly known as: VIMPAT  Take 200 mg by mouth 2 (two) times daily. What changed: Another medication with the same name was removed. Continue taking this medication, and follow the directions you see here.   levETIRAcetam  750 MG tablet Commonly known as: KEPPRA  Take 2 tablets (1,500 mg total) by mouth 2 (two) times daily.   multivitamins with iron  Tabs tablet Take 1 tablet by mouth at bedtime.   oxyCODONE -acetaminophen  5-325 MG tablet Commonly known as: PERCOCET/ROXICET Take 1 tablet by mouth every 6 (six) hours as needed for moderate pain (pain score 4-6) or severe pain (pain score 7-10).   polyethylene glycol 17 g packet Commonly known as: MIRALAX  / GLYCOLAX  Take 34 g by mouth daily.   senna 8.6 MG Tabs tablet Commonly known as: SENOKOT Take 1 tablet (8.6 mg total) by mouth daily.   tamsulosin  0.4 MG Caps capsule Commonly known as: FLOMAX  Take 0.4 mg by mouth at bedtime.   thiamine  100 MG  tablet Commonly known as: VITAMIN B1 Take 100 mg by mouth in the morning.   Xarelto  20 MG Tabs tablet Generic drug: rivaroxaban  TAKE 1 TABLET BY MOUTH ONCE A DAY WITH SUPPER        Discharge Exam: Filed Weights   02/28/24 2017  Weight: 77.6 kg   General.  Frail elderly man, in no acute distress. Pulmonary.  Lungs clear bilaterally, normal respiratory effort. CV.  Regular rate and rhythm, no JVD, rub or murmur. Abdomen.  Soft, nontender, nondistended, BS positive. CNS.  Alert and oriented .  No new focal neurologic deficit. Extremities.  No edema,  pulses intact and symmetrical.  Condition at discharge:  stable  The results of significant diagnostics from this hospitalization (including imaging, microbiology, ancillary and laboratory) are listed below for reference.   Imaging Studies: EEG adult Result Date: 03/01/2024 Shelton Arlin KIDD, MD     03/01/2024 12:30 PM Patient Name: Lennex Pietila MRN: [id number] Epilepsy Attending: Arlin KIDD Shelton Referring Physician/Provider: Fernand Prost, MD Date: 03/01/2024 Duration: 28.16 mins Patient history: 74yo M with multiple episodes of breakthrough seizures. EEG to evaluate for seizure Level of alertness: Awake, asleep AEDs during EEG study: LEV, LCM, Ativan  Technical aspects: This EEG study was done with scalp electrodes positioned according to the 10-20 International system of electrode placement. Electrical activity was reviewed with band pass filter of 1-70Hz , sensitivity of 7 uV/mm, display speed of 62mm/sec with a 60Hz  notched filter applied as appropriate. EEG data were recorded continuously and digitally stored.  Video monitoring was available and reviewed as appropriate. Description: The posterior dominant rhythm consists of 8 Hz activity of moderate voltage (25-35 uV) seen predominantly in posterior head regions, symmetric and reactive to eye opening and eye closing. Sleep was characterized by vertex waves, sleep spindles (12 to 14 Hz), maximal frontocentral region.  There is continuous 3 to 6 Hz theta-delta slowing in left hemisphere. Hyperventilation and photic stimulation were not performed.   ABNORMALITY - Continuous slow,  left hemisphere IMPRESSION: This study is suggestive of cortical dysfunction arising from left hemisphere likely secondary to underlying structural abnormality. No seizures or epileptiform discharges were seen throughout the recording. Arlin KIDD Shelton   ECHOCARDIOGRAM COMPLETE BUBBLE STUDY Result Date: 02/29/2024    ECHOCARDIOGRAM REPORT   Patient Name:   ESTEFANO VICTORY Date of Exam: 02/29/2024 Medical Rec #:  [id number]           Height:       71.0 in Accession #:    [id number]         Weight:       171.0 lb Date of Birth:  [birth year]          BSA:          1.973 m Patient Age:    73 years           BP:           178/70 mmHg Patient Gender: M                  HR:           52 bpm. Exam Location:  [facility] Procedure: 2D Echo, Cardiac Doppler, Color Doppler, 3D Echo, Strain Analysis and            Saline Contrast Bubble Study (Both Spectral and Color Flow Doppler            were utilized during procedure). Indications:     Stroke 434.91 / I63.9  History:  Patient has prior history of Echocardiogram examinations, most                  recent 06/24/2022. Arrythmias:Atrial Fibrillation; Risk                  Factors:Hypertension.  Sonographer:     Christopher Furnace Referring Phys:  [id number] Yolanda Huffstetler Diagnosing Phys: Cara JONETTA Lovelace MD  Sonographer Comments: Global longitudinal strain was attempted. IMPRESSIONS  1. Left ventricular ejection fraction, by estimation, is 65 to 70%. The left ventricle has normal function. The left ventricle has no regional wall motion abnormalities. There is moderate asymmetric left ventricular hypertrophy of the inferior and basal  segments. Left ventricular diastolic parameters are consistent with Grade II diastolic dysfunction (pseudonormalization). The average left ventricular global longitudinal strain is 16.1 %. The global longitudinal strain is normal.  2. Right ventricular systolic function is normal. The right ventricular size is normal.  3. The mitral valve is normal in structure. Trivial mitral valve regurgitation.  4. The aortic valve is grossly normal. Aortic valve regurgitation is not visualized. Aortic valve sclerosis/calcification is present, without any evidence of aortic stenosis. FINDINGS  Left Ventricle: Left ventricular ejection fraction, by estimation, is 65 to 70%. The left ventricle has normal function. The left ventricle has no regional wall motion abnormalities. The average left ventricular  global longitudinal strain is 16.1 %. Strain was performed and the global longitudinal strain is normal. The left ventricular internal cavity size was normal in size. There is moderate asymmetric left ventricular hypertrophy of the inferior and basal segments. Left ventricular diastolic parameters are consistent with Grade II diastolic dysfunction (pseudonormalization). Right Ventricle: The right ventricular size is normal. No increase in right ventricular wall thickness. Right ventricular systolic function is normal. Left Atrium: Left atrial size was normal in size. Right Atrium: Right atrial size was normal in size. Pericardium: There is no evidence of pericardial effusion. Mitral Valve: The mitral valve is normal in structure. Trivial mitral valve regurgitation. MV peak gradient, 4.2 mmHg. The mean mitral valve gradient is 2.0 mmHg. Tricuspid Valve: The tricuspid valve is normal in structure. Tricuspid valve regurgitation is trivial. Aortic Valve: The aortic valve is grossly normal. Aortic valve regurgitation is not visualized. Aortic valve sclerosis/calcification is present, without any evidence of aortic stenosis. Aortic valve mean gradient measures 3.0 mmHg. Aortic valve peak gradient measures 5.0 mmHg. Aortic valve area, by VTI measures 2.34 cm. Pulmonic Valve: The pulmonic valve was normal in structure. Pulmonic valve regurgitation is not visualized. Aorta: The ascending aorta was not well visualized. IAS/Shunts: No atrial level shunt detected by color flow Doppler. Agitated saline contrast was given intravenously to evaluate for intracardiac shunting. Additional Comments: 3D was performed not requiring image post processing on an independent workstation and was normal.  LEFT VENTRICLE PLAX 2D LVIDd:         3.80 cm   Diastology LVIDs:         2.40 cm   LV e' medial:    5.98 cm/s LV PW:         1.10 cm   LV E/e' medial:  15.4 LV IVS:        1.50 cm   LV e' lateral:   5.77 cm/s LVOT diam:     2.20 cm   LV  E/e' lateral: 16.0 LV SV:         66 LV SV Index:   33        2D Longitudinal Strain LVOT  Area:     3.80 cm  2D Strain GLS (A4C):   16.8 % LV IVRT:       140 msec  2D Strain GLS (A3C):   15.0 %                          2D Strain GLS (A2C):   16.4 %                          2D Strain GLS Avg:     16.1 % RIGHT VENTRICLE RV Basal diam:  4.10 cm     PULMONARY VEINS RV Mid diam:    3.20 cm     Diastolic Velocity: 82.10 cm/s RV S prime:     12.80 cm/s  S/D Velocity:       0.80 TAPSE (M-mode): 2.2 cm      Systolic Velocity:  63.60 cm/s LEFT ATRIUM             Index        RIGHT ATRIUM           Index LA diam:        3.40 cm 1.72 cm/m   RA Area:     19.50 cm LA Vol (A2C):   32.9 ml 16.68 ml/m  RA Volume:   51.50 ml  26.11 ml/m LA Vol (A4C):   53.4 ml 27.07 ml/m LA Biplane Vol: 42.2 ml 21.39 ml/m  AORTIC VALVE AV Area (Vmax):    2.52 cm AV Area (Vmean):   2.30 cm AV Area (VTI):     2.34 cm AV Vmax:           112.00 cm/s AV Vmean:          83.800 cm/s AV VTI:            0.281 m AV Peak Grad:      5.0 mmHg AV Mean Grad:      3.0 mmHg LVOT Vmax:         74.20 cm/s LVOT Vmean:        50.600 cm/s LVOT VTI:          0.173 m LVOT/AV VTI ratio: 0.62  AORTA Ao Root diam: 3.40 cm MITRAL VALVE               TRICUSPID VALVE MV Area (PHT): 2.81 cm    TR Peak grad:   6.2 mmHg MV Area VTI:   1.89 cm    TR Vmax:        125.00 cm/s MV Peak grad:  4.2 mmHg MV Mean grad:  2.0 mmHg    SHUNTS MV Vmax:       1.02 m/s    Systemic VTI:  0.17 m MV Vmean:      62.7 cm/s   Systemic Diam: 2.20 cm MV Decel Time: 270 msec MV E velocity: 92.20 cm/s MV A velocity: 81.80 cm/s MV E/A ratio:  1.13 Dwayne D Callwood MD Electronically signed by Cara JONETTA Lovelace MD Signature Date/Time: 02/29/2024/2:39:42 PM    Final    MR BRAIN WO CONTRAST Result Date: 02/29/2024 EXAM: MRI Brain Without Contrast 02/29/2024 01:29:05 AM TECHNIQUE: Multiplanar multisequence MRI of the head/brain was performed without the administration of intravenous contrast.  COMPARISON: CT Head 02/28/24 CLINICAL HISTORY: Neuro deficit, acute, stroke suspected FINDINGS: BRAIN AND VENTRICLES: Remote left posterior MCA territory infarct with left parieto-occipital encephalomalacia. No acute infarct. No  intracranial hemorrhage. No mass. No midline shift. No hydrocephalus. The sella is unremarkable. Normal flow voids. ORBITS: No acute abnormality. SINUSES AND MASTOIDS: No acute abnormality. BONES AND SOFT TISSUES: Normal marrow signal. No acute soft tissue abnormality. IMPRESSION: 1. No acute intracranial abnormality. 2. Remote left posterior MCA territory infarct. Electronically signed by: Gilmore Molt 02/29/2024 02:00 AM EST RP Workstation: [id number]   CT ANGIO HEAD NECK W WO CM (CODE STROKE) Result Date: 02/28/2024 EXAM: CTA Head and Neck with Intravenous Contrast. CLINICAL HISTORY: Neuro deficit, acute, stroke suspected. TECHNIQUE: Axial CTA images of the head and neck performed with intravenous contrast. MIP reconstructed images were created and reviewed. Note: Per PQRS, the description of internal carotid artery percent stenosis, including 0 percent or normal exam, is based on [place] [employer] Symptomatic Carotid Endarterectomy Trial (NASCET) criteria. Dose reduction technique was used including one or more of the following: automated exposure control, adjustment of mA and kV according to patient size, and/or iterative reconstruction. CONTRAST: With; COMPARISON: None provided. FINDINGS: CTA NECK: COMMON CAROTID ARTERIES: No significant stenosis. No dissection or occlusion. INTERNAL CAROTID ARTERIES: No stenosis by NASCET criteria. Mild to moderate right intracranial ICA stenosis. No dissection or occlusion. VERTEBRAL ARTERIES: No significant stenosis. No dissection or occlusion. CTA HEAD: ANTERIOR CEREBRAL ARTERIES: No significant stenosis. No occlusion. No aneurysm. MIDDLE CEREBRAL ARTERIES: No significant stenosis. No occlusion. No aneurysm. POSTERIOR CEREBRAL ARTERIES: Moderate  right p2 PCA stenosis. No occlusion. No aneurysm. BASILAR ARTERY: No significant stenosis. No occlusion. No aneurysm. OTHER: SOFT TISSUES: No acute finding. No masses or lymphadenopathy. BONES: No acute osseous abnormality. IMPRESSION: 1. No large vessel occlusion. 2. Moderate right P2 PCA stenosis. 3. Mild to moderate right intracranial ICA stenosis. Electronically signed by: Gilmore Molt 02/28/2024 08:21 PM EST RP Workstation: [id number]   CT HEAD CODE STROKE WO CONTRAST Result Date: 02/28/2024 EXAM: CT HEAD WITHOUT CONTRAST 02/28/2024 07:34:13 PM TECHNIQUE: CT of the head was performed without the administration of intravenous contrast. Automated exposure control, iterative reconstruction, and/or weight based adjustment of the mA/kV was utilized to reduce the radiation dose to as low as reasonably achievable. COMPARISON: Head CT 02/03/2024 and MRI 08/26/2023. CLINICAL HISTORY: Neuro deficit, acute, stroke suspected. FINDINGS: BRAIN AND VENTRICLES: There is no evidence of an acute infarct, intracranial hemorrhage, mass, midline shift, hydrocephalus, or extra-axial fluid collection. A moderate sized chronic infarct is again noted in the posterior left MCA territory, and there is also an unchanged chronic lacunar infarct in the right thalamus. There is ex vacuo dilatation of the left lateral ventricle. There is mild cerebral atrophy. Mild hypodensities elsewhere in the cerebral white matter bilaterally are nonspecific but compatible with chronic small vessel ischemic disease. Calcified atherosclerosis at the skull base. ORBITS: No acute abnormality. SINUSES: Mild mucosal thickening in the ethmoid sinuses. Clear mastoid air cells. SOFT TISSUES AND SKULL: Small right frontal scalp lipoma. No skull fracture. [place] Stroke Program Early CT Score (ASPECTS) ----- Ganglionic (caudate, IC, lentiform nucleus, insula, M1-M3): 7 Supraganglionic (M4-M6): 3 Total: 10 These results were communicated by telephone to Dr. Fernand  on 02/28/2024 at 7:48 pm. IMPRESSION: 1. No acute intracranial abnormality.  ASPECTS of 10. 2. 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 Electronically signed by: Dasie Hamburg MD 02/28/2024 07:48 PM EST RP Workstation: [id number]   DG Hip Unilat W or Wo Pelvis 2-3 Views Right Result Date: 02/04/2024 EXAM: 2 or 3 VIEW(S) XRAY OF THE RIGHT HIP 02/04/2024 02:26:00 AM COMPARISON: 10/13/2023. CLINICAL HISTORY: fall, right sided pain FINDINGS: BONES AND JOINTS: Prior  right hip replacement. Lucency in the greater trochanter is again noted, compatible with chronic greater trochanter fracture, unchanged. Irregularity noted in the left superior and inferior pubic rami compatible with acute fractures. No malalignment. SOFT TISSUES: The soft tissues are unremarkable. IMPRESSION: 1. Acute fractures of the left superior and inferior pubic rami. 2. Right hip arthroplasty with unchanged old greater trochanter fracture. Electronically signed by: Franky Crease MD 02/04/2024 02:33 AM EST RP Workstation: [id number]   DG Chest Portable 1 View Result Date: 02/04/2024 EXAM: 1 VIEW(S) XRAY OF THE CHEST 02/04/2024 02:26:00 AM COMPARISON: 10/08/2023. CLINICAL HISTORY: fall, right sided pain FINDINGS: LUNGS AND PLEURA: No focal pulmonary opacity. No pleural effusion. No pneumothorax. HEART AND MEDIASTINUM: No acute abnormality of the cardiac and mediastinal silhouettes. BONES AND SOFT TISSUES: No acute osseous abnormality. IMPRESSION: 1. No acute cardiopulmonary process identified. Electronically signed by: Franky Crease MD 02/04/2024 02:30 AM EST RP Workstation: [id number]   CT Cervical Spine Wo Contrast Result Date: 02/03/2024 EXAM: CT CERVICAL SPINE WITHOUT CONTRAST 02/03/2024 08:34:48 PM TECHNIQUE: CT of the cervical spine was performed without the administration of intravenous contrast. Multiplanar reformatted images are provided for review. Automated exposure control, iterative reconstruction, and/or weight based adjustment  of the mA/kV was utilized to reduce the radiation dose to as low as reasonably achievable. COMPARISON: None available. CLINICAL HISTORY: fall fall FINDINGS: CERVICAL SPINE: BONES AND ALIGNMENT: No acute fracture or traumatic malalignment. DEGENERATIVE CHANGES: C5-C6 moderate degenerative changes of the spine. SOFT TISSUES: No prevertebral soft tissue swelling. IMPRESSION: 1. No acute abnormality of the cervical spine. Electronically signed by: Morgane Naveau MD 02/03/2024 08:45 PM EST RP Workstation: [id number]   CT HEAD WO CONTRAST Result Date: 02/03/2024 EXAM: CT HEAD WITHOUT 02/03/2024 08:34:48 PM TECHNIQUE: CT of the head was performed without the administration of intravenous contrast. Automated exposure control, iterative reconstruction, and/or weight based adjustment of the mA/kV was utilized to reduce the radiation dose to as low as reasonably achievable. COMPARISON: 02/03/2024 CLINICAL HISTORY: Ataxia, acute, traumatic (Ped 0-17y) FINDINGS: BRAIN AND VENTRICLES: No acute intracranial hemorrhage. No mass effect or midline shift. No extra-axial fluid collection. No hydrocephalus. Patchy and confluent decreased attenuation throughout the deep and periventricular white matter of the cerebral hemispheres bilaterally, compatible with chronic microvascular ischemic disease. Chronic left occipital infarction. Atherosclerotic calcifications within the cavernous internal carotid arteries. ORBITS: No acute abnormality. SINUSES AND MASTOIDS: No acute abnormality. SOFT TISSUES AND SKULL: No acute skull fracture. 3 x 0.6cm right frontal scalp lipoma. IMPRESSION: 1. No acute intracranial abnormality. 2. Right frontal scalp lipoma. Electronically signed by: Morgane Naveau MD 02/03/2024 08:44 PM EST RP Workstation: [id number]   CT Head Wo Contrast Result Date: 02/03/2024 CLINICAL DATA:  Tremors worsening today.  Recent fall. EXAM: CT HEAD WITHOUT CONTRAST TECHNIQUE: Contiguous axial images were obtained from the  base of the skull through the vertex without intravenous contrast. RADIATION DOSE REDUCTION: This exam was performed according to the departmental dose-optimization program which includes automated exposure control, adjustment of the mA and/or kV according to patient size and/or use of iterative reconstruction technique. COMPARISON:  12/01/2023 FINDINGS: Brain: Ventricles and cisterns are normal. CSF spaces are unremarkable. Evidence of chronic ischemic microvascular disease. Old left posterior parietal/occipital watershed infarct. No mass, mass effect, shift of midline structures or acute hemorrhage. Vascular: No hyperdense vessel or unexpected calcification. Skull: Normal. Negative for fracture or focal lesion. Sinuses/Orbits: Orbits are normal symmetric. Hypoplastic frontal sinuses. Minimal opacification over the ethmoid air cells. Mastoid air cells are clear. Other: None. IMPRESSION: 1. No  acute findings. 2. Chronic ischemic microvascular disease and old left posterior parietal/occipital watershed infarct. Electronically Signed   By: Toribio Agreste M.D.   On: 02/03/2024 15:28    Microbiology: Results for orders placed or performed during the hospital encounter of 10/06/23  Resp panel by RT-PCR (RSV, Flu A&B, Covid) Anterior Nasal Swab     Status: None   Collection Time: 10/08/23  1:51 PM   Specimen: Anterior Nasal Swab  Result Value Ref Range Status   SARS Coronavirus 2 by RT PCR NEGATIVE NEGATIVE Final    Comment: (NOTE) SARS-CoV-2 target nucleic acids are NOT DETECTED.  The SARS-CoV-2 RNA is generally detectable in upper respiratory specimens during the acute phase of infection. The lowest concentration of SARS-CoV-2 viral copies this assay can detect is 138 copies/mL. A negative result does not preclude SARS-Cov-2 infection and should not be used as the sole basis for treatment or other patient management decisions. A negative result may occur with  improper specimen collection/handling,  submission of specimen other than nasopharyngeal swab, presence of viral mutation(s) within the areas targeted by this assay, and inadequate number of viral copies(<138 copies/mL). A negative result must be combined with clinical observations, patient history, and epidemiological information. The expected result is Negative.  Fact Sheet for Patients:  bloggercourse.com  Fact Sheet for Healthcare Providers:  seriousbroker.it  This test is no t yet approved or cleared by the [place]  FDA and  has been authorized for detection and/or diagnosis of SARS-CoV-2 by FDA under an Emergency Use Authorization (EUA). This EUA will remain  in effect (meaning this test can be used) for the duration of the COVID-19 declaration under Section 564(b)(1) of the Act, 21 U.S.C.section 360bbb-3(b)(1), unless the authorization is terminated  or revoked sooner.       Influenza A by PCR NEGATIVE NEGATIVE Final   Influenza B by PCR NEGATIVE NEGATIVE Final    Comment: (NOTE) The Xpert Xpress SARS-CoV-2/FLU/RSV plus assay is intended as an aid in the diagnosis of influenza from Nasopharyngeal swab specimens and should not be used as a sole basis for treatment. Nasal washings and aspirates are unacceptable for Xpert Xpress SARS-CoV-2/FLU/RSV testing.  Fact Sheet for Patients: bloggercourse.com  Fact Sheet for Healthcare Providers: seriousbroker.it  This test is not yet approved or cleared by the [place]  FDA and has been authorized for detection and/or diagnosis of SARS-CoV-2 by FDA under an Emergency Use Authorization (EUA). This EUA will remain in effect (meaning this test can be used) for the duration of the COVID-19 declaration under Section 564(b)(1) of the Act, 21 U.S.C. section 360bbb-3(b)(1), unless the authorization is terminated or revoked.     Resp Syncytial Virus by PCR NEGATIVE  NEGATIVE Final    Comment: (NOTE) Fact Sheet for Patients: bloggercourse.com  Fact Sheet for Healthcare Providers: seriousbroker.it  This test is not yet approved or cleared by the [place]  FDA and has been authorized for detection and/or diagnosis of SARS-CoV-2 by FDA under an Emergency Use Authorization (EUA). This EUA will remain in effect (meaning this test can be used) for the duration of the COVID-19 declaration under Section 564(b)(1) of the Act, 21 U.S.C. section 360bbb-3(b)(1), unless the authorization is terminated or revoked.  Performed at [facility], [address] [place], [place] [address]     Labs: CBC: Recent Labs  Lab 02/28/24 1955 02/29/24 0453  WBC 6.2 5.3  NEUTROABS 3.2  --   HGB 10.4* 9.4*  HCT 32.9* 29.1*  MCV 84.4 83.6  PLT 217 192   Basic Metabolic Panel: Recent Labs  Lab 02/28/24 1955 02/29/24 0453  NA 140 139  K 3.9 3.7  CL 104 106  CO2 21* 24  GLUCOSE 86 76  BUN 19 18  CREATININE 0.98 0.93  CALCIUM  9.3 8.8*  MG 1.6*  --    Liver Function Tests: Recent Labs  Lab 02/28/24 1955  AST 19  ALT 9  ALKPHOS 148*  BILITOT 0.2  PROT 7.2  ALBUMIN 4.1   CBG: Recent Labs  Lab 02/28/24 1959 02/29/24 0806 03/01/24 0936  GLUCAP 79 82 103*    Discharge time spent: greater than 30 minutes.  This record has been created using Conservation officer, historic buildings. Errors have been sought and corrected,but may not always be located. Such creation errors do not reflect on the standard of care.   Signed: Amaryllis Dare, MD [facility] 03/01/2024 "

## 2024-03-01 NOTE — Progress Notes (Signed)
 Physical Therapy Treatment Patient Details Name: John Spears MRN: [id number] DOB: 04-Jan-1951 Today's Date: 03/01/2024   History of Present Illness Pt is a 74 y/o M admitted on 02/28/24 after presenting with c/o R arm weakness. MRI negative for acute issues. PMH: HTN, HLD, paroxysmal a-fib, CHF, CVA with mild R sided weakness, alcohol use disorder, seizure disorder    PT Comments  Pt was supine in bed upon arrival. He is alert and agreeable to session. Elected not to use AD this session however author encouraged pt to use RW or SPC at DC. Pt was safely able to exit bed, stand, and tolerate ambulation ~ 200 ft. No symptoms of fatigue or LOB noted. Author attempted to call pt's sister (he lives with) without an answer. DC recs upgraded to home with HHPT. He will benefit from the continued skilled PT to maximize his independence and safety with all ADLs. Will continue current POC until medically cleared for DC.    If plan is discharge home, recommend the following: A little help with walking and/or transfers;A little help with bathing/dressing/bathroom;Assistance with cooking/housework;Assist for transportation;Help with stairs or ramp for entrance;Direct supervision/assist for financial management     Equipment Recommendations  Rolling walker (2 wheels)       Precautions / Restrictions Precautions Precautions: Fall Recall of Precautions/Restrictions: Intact Precaution/Restrictions Comments: hx of seizures, currently on CIWA Restrictions Weight Bearing Restrictions Per Provider Order: No     Mobility  Bed Mobility Overal bed mobility: Needs Assistance Bed Mobility: Supine to Sit, Sit to Supine  Supine to sit: Supervision Sit to supine: Supervision General bed mobility comments: did not use rails today. easily able to exit bed and then later return to bed after OOB activity    Transfers Overall transfer level: Needs assistance Equipment used: None Transfers: Sit to/from Stand Sit  to Stand: Supervision  General transfer comment: encouraged pt to use RW however he did not want to. He stood and sat without AD    Ambulation/Gait Ambulation/Gait assistance: Contact guard assist, Supervision Gait Distance (Feet): 200 Feet Assistive device: None Gait Pattern/deviations: Shuffle, Step-through pattern Gait velocity: WNL  General Gait Details: Pt was easily able to ambulate ~ 200 ft without AD. no fatigue noted. No LOB however pt reaches out for hallway railing a few times. Encouraged pt to use RW/ SPC at DC.    Balance Overall balance assessment: Needs assistance Sitting-balance support: Feet supported Sitting balance-Leahy Scale: Good     Standing balance support: During functional activity Standing balance-Leahy Scale: Fair          Cognition Arousal: Alert Behavior During Therapy: Flat affect, WFL for tasks assessed/performed   PT - Cognitive impairments: No family/caregiver present to determine baseline  Following commands: Intact Following commands impaired: Follows multi-step commands with increased time    Cueing Cueing Techniques: Verbal cues     General Comments General comments (skin integrity, edema, etc.): discussed DC home versus DC to SNF. Author feels patient is close to his baseline and will benefit form HHPT to continue to focus on improving strength, balance, and overall safety with all ADLs      Pertinent Vitals/Pain Pain Assessment Pain Assessment: 0-10 Pain Score: 3  Pain Location: R hip & shoulder Pain Descriptors / Indicators: Discomfort Pain Intervention(s): Limited activity within patient's tolerance, Monitored during session, Premedicated before session, Repositioned     PT Goals (current goals can now be found in the care plan section) Acute Rehab PT Goals Patient Stated Goal: get better  Progress towards PT goals: Progressing toward goals    Frequency    Min 3X/week       AM-PAC PT 6 Clicks Mobility   Outcome  Measure  Help needed turning from your back to your side while in a flat bed without using bedrails?: None Help needed moving from lying on your back to sitting on the side of a flat bed without using bedrails?: A Little Help needed moving to and from a bed to a chair (including a wheelchair)?: A Little Help needed standing up from a chair using your arms (e.g., wheelchair or bedside chair)?: A Little Help needed to walk in hospital room?: A Little Help needed climbing 3-5 steps with a railing? : A Little 6 Click Score: 19    End of Session   Activity Tolerance: Patient tolerated treatment well Patient left: in bed;with bed alarm set;with call bell/phone within reach;with family/visitor present Nurse Communication: Mobility status PT Visit Diagnosis: Unsteadiness on feet (R26.81);Muscle weakness (generalized) (M62.81);Difficulty in walking, not elsewhere classified (R26.2);Other abnormalities of gait and mobility (R26.89)     Time: 8574-8557 PT Time Calculation (min) (ACUTE ONLY): 17 min  Charges:    $Gait Training: 8-22 mins PT General Charges $$ ACUTE PT VISIT: 1 Visit                     Rankin Essex PTA 03/01/2024, 3:17 PM

## 2024-03-01 NOTE — Plan of Care (Signed)
" °  Problem: Education: Goal: Knowledge of patient specific risk factors will improve (DELETE if not current risk factor) Outcome: Progressing   Problem: Coping: Goal: Will verbalize positive feelings about self Outcome: Progressing   Problem: Health Behavior/Discharge Planning: Goal: Goals will be collaboratively established with patient/family Outcome: Progressing   Problem: Nutrition: Goal: Risk of aspiration will decrease Outcome: Progressing   "

## 2024-03-01 NOTE — Progress Notes (Signed)
 Eeg done

## 2024-03-01 NOTE — NC FL2 (Signed)
 " [place]  MEDICAID FL2 LEVEL OF CARE FORM     IDENTIFICATION  Patient Name: John Spears Birthdate: 1950/12/06 Sex: male Admission Date (Current Location): 02/28/2024  [place] and [place] Number:  Chiropodist and Address:  [facility], [address] [place], [place] [address]      Provider Number: [id number]  Attending Physician Name and Address:  Caleen Qualia, MD  Relative Name and Phone Number:  Lavelle Patron  Sister, Emergency Contact  [phone]    Current Level of Care: Hospital Recommended Level of Care: Skilled Nursing Facility Prior Approval Number:    Date Approved/Denied:   PASRR Number: [id number]  Discharge Plan: SNF    Current Diagnoses: Patient Active Problem List   Diagnosis Date Noted   Stroke-like symptoms 02/28/2024   Normocytic anemia 02/10/2024   Periprosthetic intertrochanteric fracture of femur 10/06/2023   HLD (hyperlipidemia) 07/23/2023   BPH (benign prostatic hyperplasia) 07/23/2023   Chronic diastolic CHF (congestive heart failure) (HCC) 03/09/2023   Atrial fibrillation (HCC) 03/09/2023   Seizure (HCC) 03/09/2023   Toxic metabolic encephalopathy 03/09/2023   Acute metabolic encephalopathy 01/26/2023   Malnutrition of moderate degree 12/24/2022   Alcohol use disorder 12/22/2022   Thrombocytopenia 12/22/2022   Breakthrough seizure (HCC) 12/22/2022   CKD (chronic kidney disease), stage III (HCC) 09/25/2022   Chest congestion 04/04/2022   Chronic cough 04/04/2022   Dilated cardiomyopathy (HCC) 02/26/2022   Alcohol abuse 02/26/2022   Seizures (HCC) 02/20/2022   Transaminitis    Traumatic rhabdomyolysis    Thrombocytopenia    History of CVA (cerebrovascular accident) 10/22/2016   Essential hypertension 10/22/2016   Alcoholism (HCC) 10/22/2016   Cerebrovascular accident (CVA) (HCC) 04/05/2016    Orientation RESPIRATION BLADDER Height & Weight     Self    Continent Weight: 77.6 kg Height:   5' 11 (180.3 cm)  BEHAVIORAL SYMPTOMS/MOOD NEUROLOGICAL BOWEL NUTRITION STATUS      Continent Diet (Heart)  AMBULATORY STATUS COMMUNICATION OF NEEDS Skin   Limited Assist Verbally                         Personal Care Assistance Level of Assistance  Bathing, Feeding, Dressing Bathing Assistance: Limited assistance Feeding assistance: Independent Dressing Assistance: Limited assistance     Functional Limitations Info             SPECIAL CARE FACTORS FREQUENCY  PT (By licensed PT), OT (By licensed OT)     PT Frequency: 5 x week OT Frequency: 5 x week            Contractures      Additional Factors Info  Code Status, Allergies Code Status Info: FULL Code Allergies Info: NKA           Current Medications (03/01/2024):  This is the current hospital active medication list Current Facility-Administered Medications  Medication Dose Route Frequency Provider Last Rate Last Admin   acetaminophen  (TYLENOL ) tablet 650 mg  650 mg Oral Q6H PRN Khan, Ghalib, MD   650 mg at 02/29/24 1008   Or   acetaminophen  (TYLENOL ) suppository 650 mg  650 mg Rectal Q6H PRN Fernand Prost, MD       amLODipine  (NORVASC ) tablet 10 mg  10 mg Oral QHS Amin, Sumayya, MD       aspirin  EC tablet 81 mg  81 mg Oral Daily Fernand Prost, MD   81 mg at 03/01/24 0918   atorvastatin  (LIPITOR) tablet 40 mg  40  mg Oral QHS Khan, Ghalib, MD   40 mg at 02/29/24 2107   carvedilol  (COREG ) tablet 12.5 mg  12.5 mg Oral BID Amin, Sumayya, MD   12.5 mg at 03/01/24 1236   cyanocobalamin  (VITAMIN B12) tablet 1,000 mcg  1,000 mcg Oral q AM Amin, Sumayya, MD   1,000 mcg at 03/01/24 0918   feeding supplement (ENSURE ENLIVE / ENSURE PLUS) liquid 237 mL  237 mL Oral BID BM Amin, Sumayya, MD   237 mL at 03/01/24 1238   folic acid  (FOLVITE ) tablet 1 mg  1 mg Oral Daily Khan, Ghalib, MD   1 mg at 03/01/24 9081   lacosamide  (VIMPAT ) tablet 200 mg  200 mg Oral BID Khan, Ghalib, MD   200 mg at 03/01/24 9081   levETIRAcetam   (KEPPRA ) tablet 1,500 mg  1,500 mg Oral BID Khan, Ghalib, MD   1,500 mg at 03/01/24 9081   LORazepam  (ATIVAN ) tablet 1-4 mg  1-4 mg Oral Q1H PRN Khan, Ghalib, MD   1 mg at 02/29/24 1252   Or   LORazepam  (ATIVAN ) injection 1-4 mg  1-4 mg Intravenous Q1H PRN Khan, Ghalib, MD   2 mg at 02/29/24 1111   multivitamin with minerals tablet 1 tablet  1 tablet Oral Daily Fernand Prost, MD   1 tablet at 03/01/24 9082   ondansetron  (ZOFRAN ) tablet 4 mg  4 mg Oral Q6H PRN Fernand Prost, MD       Or   ondansetron  (ZOFRAN ) injection 4 mg  4 mg Intravenous Q6H PRN Khan, Ghalib, MD       rivaroxaban  (XARELTO ) tablet 20 mg  20 mg Oral Q supper Fernand Rossie HERO, MD   20 mg at 02/29/24 1707   senna-docusate (Senokot-S) tablet 1 tablet  1 tablet Oral QHS PRN Fernand Prost, MD       sodium chloride  flush (NS) 0.9 % injection 3 mL  3 mL Intravenous Q12H Khan, Ghalib, MD   3 mL at 03/01/24 0920   tamsulosin  (FLOMAX ) capsule 0.4 mg  0.4 mg Oral QHS Khan, Ghalib, MD   0.4 mg at 02/29/24 2105   thiamine  (VITAMIN B1) tablet 100 mg  100 mg Oral Daily Khan, Ghalib, MD   100 mg at 03/01/24 9081   Or   thiamine  (VITAMIN B1) injection 100 mg  100 mg Intravenous Daily Khan, Ghalib, MD         Discharge Medications: Please see discharge summary for a list of discharge medications.  Relevant Imaging Results:  Relevant Lab Results:   Additional Information SSN: [id number]  Dalia GORMAN Fuse, RN     "

## 2024-03-01 NOTE — Progress Notes (Signed)
 Mobility Specialist - Progress Note   03/01/24 1044  Mobility  Activity Ambulated with assistance  Level of Assistance Contact guard assist, steadying assist  Assistive Device Front wheel walker  Distance Ambulated (ft) 180 ft  Activity Response Tolerated well  Mobility visit 1 Mobility  Mobility Specialist Start Time (ACUTE ONLY) 1010  Mobility Specialist Stop Time (ACUTE ONLY) 1021  Mobility Specialist Time Calculation (min) (ACUTE ONLY) 11 min   Pt supine upon entry, utilizing RA. Pt agreeable to OOB amb this date. He completed bed mob ModI, requiring CGA to stand and amb one lap around the NS, tolerated well--- endorsed min dyspnea upon return to the room. Pt left supine with alarm set and needs within reach.  America Silvan Mobility Specialist 03/01/2024 10:56 AM

## 2024-03-02 ENCOUNTER — Telehealth: Payer: Self-pay

## 2024-03-02 NOTE — Transitions of Care (Post Inpatient/ED Visit) (Signed)
" ° °  03/02/2024  Name: John Spears MRN: [id number] DOB: [birth year]  Today's TOC FU Call Status: Today's TOC FU Call Status:: Unsuccessful Call (1st Attempt) Unsuccessful Call (1st Attempt) Date: 03/02/24  Attempted to reach the patient regarding the most recent Inpatient/ED visit.  Left a HIPAA approved voicemail message to phone number provided in demographics per DPR.    Follow Up Plan: Additional outreach attempts will be made to reach the patient to complete the Transitions of Care (Post Inpatient/ED visit) call.   Richerd Fish, RN, BSN, CCM [facility], [facility] Management Coordinator Direct Dial: [phone]        "

## 2024-03-02 NOTE — TOC Transition Note (Signed)
 Transition of Care [facility]) - Discharge Note   Patient Details  Name: John Spears MRN: [id number] Date of Birth: [birth year]  [facility] CM/SW Contact:  Dalia GORMAN Fuse, RN Phone Number: 03/02/2024, 8:19 AM   Clinical Narrative:    The patient is medically clear to discharge to home. [facility] accepted in the hub and TOC selected as it was the only option.  No other TOC needs identified.   Final next level of care: Home w Home Health Services Barriers to Discharge: Barriers Resolved   Patient Goals and CMS Choice            Discharge Placement                       Discharge Plan and Services Additional resources added to the After Visit Summary for                            [facility] Arranged: PT, OT, RN [facility] Agency: [facility] Date [facility] Agency Contacted: 03/02/24 Time HH Agency Contacted: [phone] Representative spoke with at [facility] Agency: Accepted in the hub  Social Drivers of Health (SDOH) Interventions SDOH Screenings   Food Insecurity: Food Insecurity Present (02/10/2024)  Housing: Low Risk (02/10/2024)  Recent Concern: Housing - High Risk (12/20/2023)   Received from [facility]  Transportation Needs: Unmet Transportation Needs (02/10/2024)  Utilities: Not At Risk (02/10/2024)  Depression (PHQ2-9): Low Risk (02/10/2024)  Financial Resource Strain: Medium Risk (02/10/2024)  Social Connections: Moderately Isolated (10/06/2023)  Stress: No Stress Concern Present (02/10/2024)  Tobacco Use: Medium Risk (02/28/2024)  Health Literacy: Adequate Health Literacy (02/10/2024)     Readmission Risk Interventions     No data to display

## 2024-03-03 ENCOUNTER — Telehealth: Payer: Self-pay

## 2024-03-03 NOTE — Transitions of Care (Post Inpatient/ED Visit) (Signed)
 03/03/2024  Patient ID: John Spears, male   DOB: Jul 21, 1950, 74 y.o.   MRN: [id number]  Spoke with sister, Dickey Bloodgood, HIPAA verified and on DPR as to discuss medical information with. She confirms patient's PCP is with Dr. Morene POUR at [facility] and does not go to [facility].  Will sign off.  Richerd Fish, RN, BSN, CCM [facility], [facility] Management Coordinator Direct Dial: [phone]

## 2024-03-06 ENCOUNTER — Ambulatory Visit: Admitting: Cardiology

## 2024-03-06 ENCOUNTER — Encounter: Payer: Self-pay | Admitting: Cardiology

## 2024-03-06 VITALS — BP 152/62 | HR 63 | Ht 71.0 in | Wt 172.0 lb

## 2024-03-06 DIAGNOSIS — I10 Essential (primary) hypertension: Secondary | ICD-10-CM

## 2024-03-06 DIAGNOSIS — R569 Unspecified convulsions: Secondary | ICD-10-CM | POA: Diagnosis not present

## 2024-03-06 DIAGNOSIS — G459 Transient cerebral ischemic attack, unspecified: Secondary | ICD-10-CM | POA: Diagnosis not present

## 2024-03-06 DIAGNOSIS — F1010 Alcohol abuse, uncomplicated: Secondary | ICD-10-CM | POA: Diagnosis not present

## 2024-03-06 DIAGNOSIS — I480 Paroxysmal atrial fibrillation: Secondary | ICD-10-CM | POA: Diagnosis not present

## 2024-03-06 DIAGNOSIS — I5020 Unspecified systolic (congestive) heart failure: Secondary | ICD-10-CM

## 2024-03-06 DIAGNOSIS — E782 Mixed hyperlipidemia: Secondary | ICD-10-CM | POA: Diagnosis not present

## 2024-03-06 DIAGNOSIS — I452 Bifascicular block: Secondary | ICD-10-CM

## 2024-03-06 MED ORDER — THIAMINE HCL 100 MG PO TABS: 100.0000 mg | ORAL_TABLET | Freq: Every morning | ORAL | 5 refills | Status: AC

## 2024-03-06 MED ORDER — FUROSEMIDE 20 MG PO TABS: 20.0000 mg | ORAL_TABLET | Freq: Every day | ORAL | 3 refills | Status: AC

## 2024-03-06 MED ORDER — SENNA 8.6 MG PO TABS: 1.0000 | ORAL_TABLET | Freq: Every day | ORAL | 5 refills | Status: AC

## 2024-03-06 MED ORDER — CARVEDILOL 12.5 MG PO TABS: 12.5000 mg | ORAL_TABLET | Freq: Two times a day (BID) | ORAL | 3 refills | Status: DC

## 2024-03-06 NOTE — Progress Notes (Signed)
 "  Cardiology Office Note   Date:  03/06/2024  ID:  John, Spears 17-Dec-1950, MRN [id number] PCP: Patient, No Pcp Per  [facility] Providers Cardiologist:  Lonni Hanson, MD     History of Present Illness John Spears is a 74 y.o. male with a past medical history of cardiomyopathy, chronic HFimpEF, hypertension, seizure disorder, alcohol abuse, mixed hyperlipidemia, paroxysmal atrial fibrillation, who is here today for hospital follow-up.   Was admitted to [facility] in December 2023 after being found unresponsive by a friend.  He was initially intubated requiring vasopressor support with concern he may have been seizures prior to arrival.  Stroke workup was unremarkable.  Echocardiogram revealed an LVEF of 35-40%, moderate asymmetric hypertrophy of the basal septum and mid/apical, anterior/anterolateral hypokinesis.  PA pressures were moderately elevated and no significant valvular disease was noted.  High-sensitivity troponin peaked at 291.  He was followed by cardiology and was started on GDMT including beta-blocker, Entresto , spironolactone , daily Lasix .  He was not felt to be a good candidate for diagnostic catheterization due to ongoing encephalopathy.  He had been treated for aspiration pneumonia and was subsequently discharged to follow-up in clinic.  Seen in clinic 03/2022, accompanied by family member, during this visit he was with his only complaint being persistent chest congestion and nonproductive cough as well as nasal congestion. He was scheduled for blood work and a limited prescription. He was also sent for chest x-ray and encouraged to use nasal spray and over-the-counter guaifenesin .  He was then seen in clinic again in April 2024 stated he had been taking his medications as prescribed.  His sister had an shortage of medication making him feel impact so he was unaware of his current doses.  He denied any chest pain or shortness of breath.  He was continued to work on  escalating GDMT although he was not a candidate for SGLT2 inhibitor due to obesity, sedentary state, history of noncompliance.  It was discussed that he continued to the need for an ischemic evaluation and did not call us  to only have an updated echocardiogram completed at that time.  He continued to drink daily and was encouraged to abstain. he was seen in clinic following, 07/2022 where he reported he had been doing well.  After having repeat echocardiogram done and received a notification that his echo was normal he was placed back on his Entresto , spironolactone , and potassium.  He also continued to decline to pursue an ischemic evaluation.  He was admitted at [facility] 09/25/2022 discharge 09/26/2022 with primary complaint of chest pain.  High-sensitivity troponins were in the 40s.  EKG revealed sinus rhythm with fascicular block.  He underwent stress testing for continued ischemic evaluation that showed no significant ischemia was considered a low risk scan.  He was considered stable and was able to be discharged home.  Unfortunately he was again admitted to the hospital 02/05/2023 for acute metabolic encephalopathy.  Patient presented to the [facility] emergency department after patient's sister found him lying on the floor, 911 she was concern of seizure activity.  She had noted he had been compliant with his medications but was unclear about his alcohol use.  EMS noted seizure activity, right tremor, leftward gaze.  He was given 5 mg of Versed  which did not break his seizure activity and subsequent 2.5 mg with abatement.  He was intubated immediately upon arrival and encouraged for code was called and he was taken to CT with no evidence of bleed.  He was loaded with Keppra  and started on propofol /fentanyl  and admitted under the service of PCCM.  He was followed by neurology in the hospital and underwent an EEG.  Keppra  dosing was uptitrated to 1500 mg twice daily.  He was placed on Eliquis  secondary to stroke prophylaxis  for paroxysmal atrial fibrillation with a CHA2DS2-VASc score of 5 and aspirin  was discontinued to decrease the risk of bleeding with his Eliquis .  He was considered stable and discharged to skilled nursing facility on 02/10/2023.  He was again readmitted to [facility] from 1/14 - 03/16/2023 for acute metabolic encephalopathy secondary to seizures.  He had breakthrough seizures, and went up having his Keppra  increased to 1500 mg twice daily and started on Vimpat  150 mg twice daily.  He remained uneventful for 48 hours.  He was to be continued on seizure precautions and rehab was recommended.  On 1/17 he was preparing for discharge to SNF however revealed 3 short lasting generalized tonic-clonic seizures witnessed in the hospital.  He also was found to have a low-grade fever and to have a rhinovirus infection.  On 1/18 he had another episode of seizure-like activity after walking back from the bathroom.  Multiple adjustments were done to his seizure medications and he had remained stable was able to transfer to SNF on 03/16/2023.  He was last seen in clinic 07/05/23 accompanied by family member.  From a cardiac perspective he had been doing well.  He had required clearance for tooth extraction as they brought in a form with him today want to know how long to hold his Xarelto .  There were no medication changes that were made or further testing that was ordered at that time.   Patient was recently hospitalized at [facility] 1/5 - 03/01/2024 for strokelike symptoms.  He has a history of CVA with mild right-sided weakness at baseline.  Presented to the ED with right arm weakness and numbness.  Symptoms had improved and route to the ED.  Code stroke was activated.  On presentation labs were stable CT of the head was without any acute abnormalities which showed a chronic left MCA and right thalamic infarct.  CTA without any large vessel occlusion, moderate right PCA stenosis and mild to moderate right intracranial ICA stenosis.   Neurology was consulted and patient was admitted to complete TIA workup.  Initial workup was negative for any new stroke.  He again developed some dysarthria and was evaluated by neurology.  He was continued on aspirin  and statin therapy.  EEG was completed with slowing in the left hemisphere which was consistent with his prior infarct no seizure-like activity was noted.  Echocardiogram was completed with an LVEF of 65 to 70%, no RWMA, moderate asymmetric LVH of the inferior and basal segments, G2 DD, trivial mitral regurg, aortic valve sclerosis without any evidence of aortic stenosis.  He was considered stable was discharged home after he had worked well with OT and appeared to be back at baseline.  He returns to clinic today accompanied by his sister.  He states that overall he has been doing well.  He denies any chest pain, shortness of breath dyspnea on exertion.  States that the numbness is resolved that he had on the right side but he continues to have the residual weakness from his CVA.  His sister has many questions today and is concerned because not all of his medications were refilled on discharge from the hospital and he is out of several of his medications.  States  that he has been on his rivaroxaban  without any missed doses denies any bleeding with no blood noted in his urine or stool.  ROS: 10 point review of systems has been reviewed and considered negative the exception was been listed in the HPI  Studies Reviewed EKG Interpretation Date/Time:  Monday March 06 2024 13:39:57 EST Ventricular Rate:  63 PR Interval:  250 QRS Duration:  136 QT Interval:  416 QTC Calculation: 425 R Axis:   -69  Text Interpretation: Sinus rhythm with 1st degree A-V block Right bundle branch block Left anterior fascicular block Bifascicular block When compared with ECG of 28-Feb-2024 20:42, PREVIOUS ECG IS PRESENT Confirmed by Gerard Frederick ([id number]) on 03/06/2024 1:43:02 PM    2d echo 03/01/2023  1. Left  ventricular ejection fraction, by estimation, is 65 to 70%. The  left ventricle has normal function. The left ventricle has no regional  wall motion abnormalities. There is moderate asymmetric left ventricular  hypertrophy of the inferior and basal   segments. Left ventricular diastolic parameters are consistent with Grade  II diastolic dysfunction (pseudonormalization). The average left  ventricular global longitudinal strain is 16.1 %. The global longitudinal  strain is normal.   2. Right ventricular systolic function is normal. The right ventricular  size is normal.   3. The mitral valve is normal in structure. Trivial mitral valve  regurgitation.   4. The aortic valve is grossly normal. Aortic valve regurgitation is not  visualized. Aortic valve sclerosis/calcification is present, without any  evidence of aortic stenosis.   Lexiscan  Myoview  09/25/2022  Pharmacological myocardial perfusion imaging study with no significant  ischemia Normal wall motion, EF estimated at 67% No EKG changes concerning for ischemia at peak stress or in recovery. CT attenuation correction images with no significant coronary calcification, minimal aortic atherosclerosis Low risk scan   2D echo 06/24/2022 1. Left ventricular ejection fraction, by estimation, is 60 to 65%. The  left ventricle has normal function. There is mild left ventricular  hypertrophy. Left ventricular diastolic parameters are consistent with  Grade I diastolic dysfunction (impaired  relaxation).   2. Right ventricular systolic function is normal.   3. The mitral valve is normal in structure. Trivial mitral valve  regurgitation.   Comparison(s): Previous LVEF reported as 30-35%.    TTE 02/24/22 1. Left ventricular ejection fraction, by estimation, is 30 to 35%. The  left ventricle has moderately decreased function. The left ventricle  demonstrates regional wall motion abnormalities (see scoring  diagram/findings for description).  There is  moderate asymmetric left ventricular hypertrophy of the basal-septal  segment. Left ventricular diastolic parameters are indeterminate.   2. Right ventricular systolic function is normal. The right ventricular  size is normal. There is moderately elevated pulmonary artery systolic  pressure. The estimated right ventricular systolic pressure is 52.1 mmHg.   3. The mitral valve is normal in structure. Trivial mitral valve  regurgitation.   4. The aortic valve is tricuspid. Aortic valve regurgitation is not  visualized. No aortic stenosis is present.   5. The inferior vena cava is normal in size with <50% respiratory  variability, suggesting right atrial pressure of 8 mmHg.   Risk Assessment/Calculations  CHA2DS2-VASc Score = 5   This indicates a 7.2% annual risk of stroke. The patient's score is based upon: CHF History: 1 HTN History: 1 Diabetes History: 0 Stroke History: 2 Vascular Disease History: 0 Age Score: 1 Gender Score: 0        Physical Exam VS:  BP ROLLEN)  152/62 (BP Location: Left Arm, Patient Position: Sitting, Cuff Size: Normal)   Pulse 63   Ht 5' 11 (1.803 m)   Wt 172 lb (78 kg)   SpO2 98%   BMI 23.99 kg/m        Wt Readings from Last 3 Encounters:  03/06/24 172 lb (78 kg)  02/28/24 171 lb (77.6 kg)  02/10/24 169 lb 14.4 oz (77.1 kg)    GEN: Well nourished, well developed in no acute distress NECK: No JVD; No carotid bruits CARDIAC: RRR, no murmurs, rubs, gallops RESPIRATORY:  Clear to auscultation without rales, wheezing or rhonchi  ABDOMEN: Soft, non-tender, non-distended EXTREMITIES:  No edema; No deformity   ASSESSMENT AND PLAN TIA versus history of CVA with residual right-sided weakness.  During recent hospitalization EEG revealed no seizure activity and CT and MRI of the head revealed no new infarct.  He was recommended to continue to follow-up with neurology.  Request for an appointment with neurology made today as his sister stated they  did not have any follow-up appointment scheduled.  Paroxysmal atrial fibrillation maintaining sinus rhythm on exam today.  EKG reveals sinus bradycardia 63 with first-degree AV block and chronic right bundle branch block.  He is continued on rivaroxaban  20 mg daily and carvedilol  12.5 mg twice daily.  He has been off of his carvedilol  since discharge from the hospital as he is out of the medication and the refill was sent and so refill has been sent in per the sister's request.  He is also continued on his rivaroxaban  for CHA2DS2-VASc of at least 5 for stroke prophylaxis.  HFimpEF with an LVEF of 65-70%.  He appears to be euvolemic on exam.  Continues to suffer from NYHA class I-II symptoms. He is continued on carvedilol  and furosemide .  Previously was on Entresto  and spironolactone  but had to be discontinued due to elevated serum creatinine.  No further medication changes were made today as he has been out of carvedilol  and furosemide .  Primary hypertension with blood pressure today elevated at 152/62.  He has continued on amlodipine  10 mg daily carvedilol  12.5 mg twice daily furosemide  20 mg daily.  He has been encouraged to try to monitor his pressure 1 to 2 hours postmedication administration at home as well.  Mixed hyperlipidemia with last LDL 76.  He is continued on atorvastatin  40 mg daily.  Seizure disorder with recent EEG revealing no seizure activity.  He has been continued on his antiseizure medication.  EtOH abuse with continued total cessation recommended.  bifascicular block noted on EKG. recently had carvedilol  decreased from 25 mg to 12.5 mg twice daily.  Heart rate has improved since decrease.  Will continue to monitor with surveillance studies as patient is asymptomatic.       Dispo: Patient to return to clinic to see MD/APP in 3 months or sooner if needed for further evaluation.  Signed, Darcy Cordner, NP   "

## 2024-03-06 NOTE — Patient Instructions (Addendum)
 Ambulatory referral to Neurology [facility] follow up TIA  Medication Instructions:  Your physician recommends that you continue on your current medications as directed. Please refer to the Current Medication list given to you today.   *If you need a refill on your cardiac medications before your next appointment, please call your pharmacy*  Lab Work: No labs ordered today  If you have labs (blood work) drawn today and your tests are completely normal, you will receive your results only by: MyChart Message (if you have MyChart) OR A paper copy in the mail If you have any lab test that is abnormal or we need to change your treatment, we will call you to review the results.  Testing/Procedures: No test ordered today   Follow-Up: At [facility], you and your health needs are our priority.  As part of our continuing mission to provide you with exceptional heart care, our providers are all part of one team.  This team includes your primary Cardiologist (physician) and Advanced Practice Providers or APPs (Physician Assistants and Nurse Practitioners) who all work together to provide you with the care you need, when you need it.  Your next appointment:   3 month(s)  Provider:   You may see Lonni Hanson, MD or one of the following Advanced Practice Providers on your designated Care Team:   Tylene Lunch, NP

## 2024-03-07 ENCOUNTER — Encounter: Payer: Self-pay | Admitting: Oncology

## 2024-03-07 ENCOUNTER — Inpatient Hospital Stay: Attending: Oncology | Admitting: Oncology

## 2024-03-07 VITALS — BP 179/76 | HR 74 | Temp 96.5°F | Resp 18 | Wt 173.1 lb

## 2024-03-07 DIAGNOSIS — K869 Disease of pancreas, unspecified: Secondary | ICD-10-CM | POA: Insufficient documentation

## 2024-03-07 DIAGNOSIS — I4891 Unspecified atrial fibrillation: Secondary | ICD-10-CM | POA: Diagnosis not present

## 2024-03-07 DIAGNOSIS — Z79899 Other long term (current) drug therapy: Secondary | ICD-10-CM | POA: Diagnosis not present

## 2024-03-07 DIAGNOSIS — F1010 Alcohol abuse, uncomplicated: Secondary | ICD-10-CM | POA: Diagnosis not present

## 2024-03-07 DIAGNOSIS — Z87891 Personal history of nicotine dependence: Secondary | ICD-10-CM | POA: Diagnosis not present

## 2024-03-07 DIAGNOSIS — D649 Anemia, unspecified: Secondary | ICD-10-CM | POA: Diagnosis not present

## 2024-03-07 DIAGNOSIS — Z7901 Long term (current) use of anticoagulants: Secondary | ICD-10-CM | POA: Insufficient documentation

## 2024-03-07 DIAGNOSIS — Z7982 Long term (current) use of aspirin: Secondary | ICD-10-CM | POA: Insufficient documentation

## 2024-03-07 MED ORDER — IRON-VITAMIN C 65-125 MG PO TABS: 1.0000 | ORAL_TABLET | Freq: Every day | ORAL | 4 refills | Status: AC

## 2024-03-07 NOTE — Progress Notes (Signed)
 " Hematology/Oncology Progress note [phone] [phone]           REFERRING PROVIDER: Cecily Katz, PA-C   CHIEF COMPLAINTS/REASON FOR VISIT:  Evaluation of anemia   ASSESSMENT & PLAN:   Normocytic anemia Chronic anemia, with current hemoglobin close to his baseline.  Blood work results reviewed and discussed with patient.  Negative M protein on protein electrophoresis, adequate B12 folate, normal LDH and haptoglobin, Lab Results  Component Value Date   HGB 9.4 (L) 02/29/2024   TIBC 294 02/10/2024   IRONPCTSAT 11 (L) 02/10/2024   FERRITIN 51 02/10/2024    Anemia is likely secondary to chronic disease/kidney insufficiency. Recommend patient to start oral iron  supplementation Vitron-C 1 tablet daily. Consider iron  infusion if no improvement. Recommend bowel regimen for possible iron  induced constipation  Alcohol abuse Recommend alcohol cessation  Atrial fibrillation (HCC) On Xarelto   Pancreatic lesion Previous MRI abdomen showed a small pancreatic tail lesion.  I will check CA 19-9, neuroendocrine tumor markers at the next follow-up.   Orders Placed This Encounter  Procedures   CBC with Differential (Cancer Center Only)    Standing Status:   Future    Expected Date:   06/05/2024    Expiration Date:   09/03/2024   Iron  and TIBC    Standing Status:   Future    Expected Date:   06/05/2024    Expiration Date:   09/03/2024   Ferritin    Standing Status:   Future    Expected Date:   06/05/2024    Expiration Date:   09/03/2024   Retic Panel    Standing Status:   Future    Expected Date:   06/05/2024    Expiration Date:   09/03/2024   Comprehensive metabolic panel with GFR    Standing Status:   Future    Expected Date:   06/05/2024    Expiration Date:   09/03/2024    All questions were answered. The patient knows to call the clinic with any problems, questions or concerns.  Zelphia Cap, MD, PhD [facility] Hematology Oncology 03/07/2024    HISTORY OF PRESENTING ILLNESS:   John Spears is a  74 y.o.  male with PMH listed below was seen in consultation at the request of  Cecily Katz, PA-C  for evaluation of anemia   Discussed the use of AI scribe software for clinical note transcription with the patient, who gave verbal consent to proceed.   He has experienced chronic anemia with hemoglobin levels consistently below normal since at least 2018, with recent values over the past six months ranging from 8 to 10 g/dL. His current hemoglobin is approximately 10 g/dL, which is stable for him and consistent with his chronic baseline. Not all hematologic testing has been completed to date.  He is currently on chronic anticoagulation for atrial fibrillation. He experienced a cerebral infarction one to two years ago, resulting in right-sided hemiplegia and intermittent tremors. He denies any history of venous thromboembolism. Additional medical history includes seizures, cardiomyopathy, hypertension, and prior hip replacement. He ambulates with difficulty and is able to walk only short distances.  Patient drinks alcohol, last drink was 2-3 weeks ago.   Patient presents to discuss results.  No new complaints.   MEDICAL HISTORY:  Past Medical History:  Diagnosis Date   Alcohol abuse    Atrial fibrillation (HCC)    Heart failure with improved ejection fraction (HFimpEF) (HCC) 02/2022   a. 02/2019 Echo: EF 60-65%, GrI DD;  b. 01/2022 Echo: EF 30-35%; c. 06/2022 Echo: EF 60-65%, mild LVH, RI DD, nl RV fxn, triv MR.   History of CVA (cerebrovascular accident)    Hypertension    Seizure (HCC)    Seizure disorder (HCC)     SURGICAL HISTORY: Past Surgical History:  Procedure Laterality Date   JOINT REPLACEMENT      SOCIAL HISTORY: Social History   Socioeconomic History   Marital status: Married    Spouse name: Not on file   Number of children: Not on file   Years of education: Not on file   Highest education level: Not  on file  Occupational History   Not on file  Tobacco Use   Smoking status: Former   Smokeless tobacco: Never  Vaping Use   Vaping status: Never Used  Substance and Sexual Activity   Alcohol use: Yes    Alcohol/week: 84.0 standard drinks of alcohol    Types: 84 Cans of beer per week    Comment: reported by sister 6-12 beers a day and liquor when he can get it   Drug use: Yes    Types: Marijuana   Sexual activity: Not on file  Other Topics Concern   Not on file  Social History Narrative   ** Merged History Encounter **       Social Drivers of Health   Tobacco Use: Medium Risk (03/07/2024)   Patient History    Smoking Tobacco Use: Former    Smokeless Tobacco Use: Never    Passive Exposure: Not on file  Financial Resource Strain: Medium Risk (02/10/2024)   Overall Financial Resource Strain (CARDIA)    Difficulty of Paying Living Expenses: Somewhat hard  Food Insecurity: Food Insecurity Present (02/10/2024)   Epic    Worried About Programme Researcher, Broadcasting/film/video in the Last Year: Sometimes true    Ran Out of Food in the Last Year: Sometimes true  Transportation Needs: Unmet Transportation Needs (02/10/2024)   Epic    Lack of Transportation (Medical): Yes    Lack of Transportation (Non-Medical): No  Physical Activity: Not on file  Stress: No Stress Concern Present (02/10/2024)   [employer] of Occupational Health - Occupational Stress Questionnaire    Feeling of Stress: Not at all  Social Connections: Moderately Isolated (10/06/2023)   Social Connection and Isolation Panel    Frequency of Communication with Friends and Family: More than three times a week    Frequency of Social Gatherings with Friends and Family: More than three times a week    Attends Religious Services: 1 to 4 times per year    Active Member of [employer] or Organizations: No    Attends Banker Meetings: Never    Marital Status: Never married  Intimate Partner Violence: Not At Risk (02/10/2024)   Epic     Fear of Current or Ex-Partner: No    Emotionally Abused: No    Physically Abused: No    Sexually Abused: No  Depression (PHQ2-9): Low Risk (02/10/2024)   Depression (PHQ2-9)    PHQ-2 Score: 0  Alcohol Screen: Not on file  Housing: Low Risk (02/10/2024)   Epic    Unable to Pay for Housing in the Last Year: No    Number of Times Moved in the Last Year: 0    Homeless in the Last Year: No  Recent Concern: Housing - High Risk (12/20/2023)   Received from [facility]   Epic    In the last 12  months, was there a time when you were not able to pay the mortgage or rent on time?: Yes    In the past 12 months, how many times have you moved where you were living?: 0    At any time in the past 12 months, were you homeless or living in a shelter (including now)?: No  Utilities: Not At Risk (02/10/2024)   Epic    Threatened with loss of utilities: No  Health Literacy: Adequate Health Literacy (02/10/2024)   B1300 Health Literacy    Frequency of need for help with medical instructions: Never    FAMILY HISTORY: Family History  Problem Relation Age of Onset   Heart attack Mother 43   Cancer Father    Cancer Sister     ALLERGIES:  has no known allergies.  MEDICATIONS:  Current Outpatient Medications  Medication Sig Dispense Refill   acetaminophen  (TYLENOL ) 500 MG tablet Take 1 tablet (500 mg total) by mouth every 6 (six) hours as needed for moderate pain (pain score 4-6). 30 tablet 0   amLODipine  (NORVASC ) 10 MG tablet Take 10 mg by mouth at bedtime.     ASPIRIN  LOW DOSE 81 MG tablet Take 81 mg by mouth daily.     atorvastatin  (LIPITOR) 40 MG tablet Take 40 mg by mouth at bedtime.     carvedilol  (COREG ) 12.5 MG tablet Take 1 tablet (12.5 mg total) by mouth 2 (two) times daily. 180 tablet 3   cyanocobalamin  (VITAMIN B12) 1000 MCG tablet Take 1,000 mcg by mouth in the morning.     feeding supplement (ENSURE ENLIVE / ENSURE PLUS) LIQD Take 237 mLs by mouth 2 (two) times  daily between meals.     folic acid  (FOLVITE ) 1 MG tablet Take 1 mg by mouth in the morning.     furosemide  (LASIX ) 20 MG tablet Take 1 tablet (20 mg total) by mouth daily. 90 tablet 3   gabapentin  (NEURONTIN ) 100 MG capsule Take 100 mg by mouth 2 (two) times daily.     hydrocortisone 2.5 % cream Apply 1 Application topically 2 (two) times daily as needed.     Iron -Vitamin C  65-125 MG TABS Take 1 tablet by mouth daily. 30 tablet 4   lacosamide  (VIMPAT ) 200 MG TABS tablet Take 200 mg by mouth 2 (two) times daily.     levETIRAcetam  (KEPPRA ) 750 MG tablet Take 2 tablets (1,500 mg total) by mouth 2 (two) times daily. 120 tablet 0   Multiple Vitamins-Iron  (MULTIVITAMINS WITH IRON ) TABS tablet Take 1 tablet by mouth at bedtime.     oxyCODONE -acetaminophen  (PERCOCET/ROXICET) 5-325 MG tablet Take 1 tablet by mouth every 6 (six) hours as needed for moderate pain (pain score 4-6) or severe pain (pain score 7-10). 15 tablet 0   polyethylene glycol (MIRALAX  / GLYCOLAX ) 17 g packet Take 34 g by mouth daily. 14 each 0   rivaroxaban  (XARELTO ) 20 MG TABS tablet TAKE 1 TABLET BY MOUTH ONCE A DAY WITH SUPPER 30 tablet 5   senna (SENOKOT) 8.6 MG TABS tablet Take 1 tablet (8.6 mg total) by mouth daily. 30 tablet 5   tamsulosin  (FLOMAX ) 0.4 MG CAPS capsule Take 0.4 mg by mouth at bedtime.     thiamine  (VITAMIN B1) 100 MG tablet Take 1 tablet (100 mg total) by mouth in the morning. 30 tablet 5   No current facility-administered medications for this visit.    Review of Systems  Constitutional:  Positive for fatigue. Negative for appetite change, chills and  fever.  HENT:   Negative for hearing loss and voice change.   Eyes:  Negative for eye problems.  Respiratory:  Negative for chest tightness, cough and shortness of breath.   Cardiovascular:  Negative for chest pain.  Gastrointestinal:  Negative for abdominal distention, abdominal pain and blood in stool.  Endocrine: Negative for hot flashes.  Genitourinary:   Negative for difficulty urinating and frequency.   Musculoskeletal:  Positive for gait problem. Negative for arthralgias.  Skin:  Negative for itching and rash.  Neurological:  Positive for extremity weakness and gait problem.  Hematological:  Negative for adenopathy.  Psychiatric/Behavioral:  Negative for confusion.    PHYSICAL EXAMINATION:  Vitals:   03/07/24 1314 03/07/24 1323  BP: (!) 195/89 (!) 179/76  Pulse: 74   Resp: 18   Temp: (!) 96.5 F (35.8 C)   SpO2: 100%    Filed Weights   03/07/24 1314  Weight: 173 lb 1.6 oz (78.5 kg)    Physical Exam Constitutional:      General: He is not in acute distress. HENT:     Head: Normocephalic and atraumatic.  Eyes:     General: No scleral icterus. Cardiovascular:     Rate and Rhythm: Normal rate and regular rhythm.  Pulmonary:     Effort: Pulmonary effort is normal. No respiratory distress.     Breath sounds: No wheezing.  Abdominal:     General: Bowel sounds are normal. There is no distension.     Palpations: Abdomen is soft.  Musculoskeletal:        General: Normal range of motion.     Cervical back: Normal range of motion and neck supple.  Skin:    General: Skin is warm and dry.     Findings: No erythema or rash.  Neurological:     Mental Status: He is alert and oriented to person, place, and time. Mental status is at baseline.  Psychiatric:        Mood and Affect: Mood normal.     LABORATORY DATA:  I have reviewed the data as listed    Latest Ref Rng & Units 02/29/2024    4:53 AM 02/28/2024    7:55 PM 02/10/2024    2:54 PM  CBC  WBC 4.0 - 10.5 K/uL 5.3  6.2  5.8   Hemoglobin 13.0 - 17.0 g/dL 9.4  89.5  89.8   Hematocrit 39.0 - 52.0 % 29.1  32.9  32.6   Platelets 150 - 400 K/uL 192  217  234       Latest Ref Rng & Units 02/29/2024    4:53 AM 02/28/2024    7:55 PM 02/03/2024    8:02 PM  CMP  Glucose 70 - 99 mg/dL 76  86  896   BUN 8 - 23 mg/dL 18  19  12    Creatinine 0.61 - 1.24 mg/dL 9.06  9.01  8.97    Sodium 135 - 145 mmol/L 139  140  142   Potassium 3.5 - 5.1 mmol/L 3.7  3.9  3.7   Chloride 98 - 111 mmol/L 106  104  108   CO2 22 - 32 mmol/L 24  21  21    Calcium  8.9 - 10.3 mg/dL 8.8  9.3  9.9   Total Protein 6.5 - 8.1 g/dL  7.2  7.2   Total Bilirubin 0.0 - 1.2 mg/dL  0.2  0.3   Alkaline Phos 38 - 126 U/L  148  159   AST 15 -  41 U/L  19  24   ALT 0 - 44 U/L  9  10       RADIOGRAPHIC STUDIES: I have personally reviewed the radiological images as listed and agreed with the findings in the report. EEG adult Result Date: 03/01/2024 Shelton Arlin KIDD, MD     03/01/2024 12:30 PM Patient Name: John Spears MRN: [id number] Epilepsy Attending: Arlin KIDD Shelton Referring Physician/Provider: Fernand Prost, MD Date: 03/01/2024 Duration: 28.16 mins Patient history: 74yo M with multiple episodes of breakthrough seizures. EEG to evaluate for seizure Level of alertness: Awake, asleep AEDs during EEG study: LEV, LCM, Ativan  Technical aspects: This EEG study was done with scalp electrodes positioned according to the 10-20 International system of electrode placement. Electrical activity was reviewed with band pass filter of 1-70Hz , sensitivity of 7 uV/mm, display speed of 7mm/sec with a 60Hz  notched filter applied as appropriate. EEG data were recorded continuously and digitally stored.  Video monitoring was available and reviewed as appropriate. Description: The posterior dominant rhythm consists of 8 Hz activity of moderate voltage (25-35 uV) seen predominantly in posterior head regions, symmetric and reactive to eye opening and eye closing. Sleep was characterized by vertex waves, sleep spindles (12 to 14 Hz), maximal frontocentral region.  There is continuous 3 to 6 Hz theta-delta slowing in left hemisphere. Hyperventilation and photic stimulation were not performed.   ABNORMALITY - Continuous slow,  left hemisphere IMPRESSION: This study is suggestive of cortical dysfunction arising from left hemisphere  likely secondary to underlying structural abnormality. No seizures or epileptiform discharges were seen throughout the recording. Arlin KIDD Shelton   ECHOCARDIOGRAM COMPLETE BUBBLE STUDY Result Date: 02/29/2024    ECHOCARDIOGRAM REPORT   Patient Name:   ESTEVEN Spears Date of Exam: 02/29/2024 Medical Rec #:  [id number]          Height:       71.0 in Accession #:    [id number]         Weight:       171.0 lb Date of Birth:  21-Jun-1950          BSA:          1.973 m Patient Age:    73 years           BP:           178/70 mmHg Patient Gender: M                  HR:           52 bpm. Exam Location:  [facility] Procedure: 2D Echo, Cardiac Doppler, Color Doppler, 3D Echo, Strain Analysis and            Saline Contrast Bubble Study (Both Spectral and Color Flow Doppler            were utilized during procedure). Indications:     Stroke 434.91 / I63.9  History:         Patient has prior history of Echocardiogram examinations, most                  recent 06/24/2022. Arrythmias:Atrial Fibrillation; Risk                  Factors:Hypertension.  Sonographer:     Christopher Furnace Referring Phys:  [id number] SUMAYYA AMIN Diagnosing Phys: Cara JONETTA Lovelace MD  Sonographer Comments: Global longitudinal strain was attempted. IMPRESSIONS  1. Left ventricular ejection fraction, by estimation, is 65 to 70%. The left  ventricle has normal function. The left ventricle has no regional wall motion abnormalities. There is moderate asymmetric left ventricular hypertrophy of the inferior and basal  segments. Left ventricular diastolic parameters are consistent with Grade II diastolic dysfunction (pseudonormalization). The average left ventricular global longitudinal strain is 16.1 %. The global longitudinal strain is normal.  2. Right ventricular systolic function is normal. The right ventricular size is normal.  3. The mitral valve is normal in structure. Trivial mitral valve regurgitation.  4. The aortic valve is grossly normal. Aortic valve regurgitation  is not visualized. Aortic valve sclerosis/calcification is present, without any evidence of aortic stenosis. FINDINGS  Left Ventricle: Left ventricular ejection fraction, by estimation, is 65 to 70%. The left ventricle has normal function. The left ventricle has no regional wall motion abnormalities. The average left ventricular global longitudinal strain is 16.1 %. Strain was performed and the global longitudinal strain is normal. The left ventricular internal cavity size was normal in size. There is moderate asymmetric left ventricular hypertrophy of the inferior and basal segments. Left ventricular diastolic parameters are consistent with Grade II diastolic dysfunction (pseudonormalization). Right Ventricle: The right ventricular size is normal. No increase in right ventricular wall thickness. Right ventricular systolic function is normal. Left Atrium: Left atrial size was normal in size. Right Atrium: Right atrial size was normal in size. Pericardium: There is no evidence of pericardial effusion. Mitral Valve: The mitral valve is normal in structure. Trivial mitral valve regurgitation. MV peak gradient, 4.2 mmHg. The mean mitral valve gradient is 2.0 mmHg. Tricuspid Valve: The tricuspid valve is normal in structure. Tricuspid valve regurgitation is trivial. Aortic Valve: The aortic valve is grossly normal. Aortic valve regurgitation is not visualized. Aortic valve sclerosis/calcification is present, without any evidence of aortic stenosis. Aortic valve mean gradient measures 3.0 mmHg. Aortic valve peak gradient measures 5.0 mmHg. Aortic valve area, by VTI measures 2.34 cm. Pulmonic Valve: The pulmonic valve was normal in structure. Pulmonic valve regurgitation is not visualized. Aorta: The ascending aorta was not well visualized. IAS/Shunts: No atrial level shunt detected by color flow Doppler. Agitated saline contrast was given intravenously to evaluate for intracardiac shunting. Additional Comments: 3D was  performed not requiring image post processing on an independent workstation and was normal.  LEFT VENTRICLE PLAX 2D LVIDd:         3.80 cm   Diastology LVIDs:         2.40 cm   LV e' medial:    5.98 cm/s LV PW:         1.10 cm   LV E/e' medial:  15.4 LV IVS:        1.50 cm   LV e' lateral:   5.77 cm/s LVOT diam:     2.20 cm   LV E/e' lateral: 16.0 LV SV:         66 LV SV Index:   33        2D Longitudinal Strain LVOT Area:     3.80 cm  2D Strain GLS (A4C):   16.8 % LV IVRT:       140 msec  2D Strain GLS (A3C):   15.0 %                          2D Strain GLS (A2C):   16.4 %  2D Strain GLS Avg:     16.1 % RIGHT VENTRICLE RV Basal diam:  4.10 cm     PULMONARY VEINS RV Mid diam:    3.20 cm     Diastolic Velocity: 82.10 cm/s RV S prime:     12.80 cm/s  S/D Velocity:       0.80 TAPSE (M-mode): 2.2 cm      Systolic Velocity:  63.60 cm/s LEFT ATRIUM             Index        RIGHT ATRIUM           Index LA diam:        3.40 cm 1.72 cm/m   RA Area:     19.50 cm LA Vol (A2C):   32.9 ml 16.68 ml/m  RA Volume:   51.50 ml  26.11 ml/m LA Vol (A4C):   53.4 ml 27.07 ml/m LA Biplane Vol: 42.2 ml 21.39 ml/m  AORTIC VALVE AV Area (Vmax):    2.52 cm AV Area (Vmean):   2.30 cm AV Area (VTI):     2.34 cm AV Vmax:           112.00 cm/s AV Vmean:          83.800 cm/s AV VTI:            0.281 m AV Peak Grad:      5.0 mmHg AV Mean Grad:      3.0 mmHg LVOT Vmax:         74.20 cm/s LVOT Vmean:        50.600 cm/s LVOT VTI:          0.173 m LVOT/AV VTI ratio: 0.62  AORTA Ao Root diam: 3.40 cm MITRAL VALVE               TRICUSPID VALVE MV Area (PHT): 2.81 cm    TR Peak grad:   6.2 mmHg MV Area VTI:   1.89 cm    TR Vmax:        125.00 cm/s MV Peak grad:  4.2 mmHg MV Mean grad:  2.0 mmHg    SHUNTS MV Vmax:       1.02 m/s    Systemic VTI:  0.17 m MV Vmean:      62.7 cm/s   Systemic Diam: 2.20 cm MV Decel Time: 270 msec MV E velocity: 92.20 cm/s MV A velocity: 81.80 cm/s MV E/A ratio:  1.13 Dwayne D Callwood MD  Electronically signed by Cara JONETTA Lovelace MD Signature Date/Time: 02/29/2024/2:39:42 PM    Final    MR BRAIN WO CONTRAST Result Date: 02/29/2024 EXAM: MRI Brain Without Contrast 02/29/2024 01:29:05 AM TECHNIQUE: Multiplanar multisequence MRI of the head/brain was performed without the administration of intravenous contrast. COMPARISON: CT Head 02/28/24 CLINICAL HISTORY: Neuro deficit, acute, stroke suspected FINDINGS: BRAIN AND VENTRICLES: Remote left posterior MCA territory infarct with left parieto-occipital encephalomalacia. No acute infarct. No intracranial hemorrhage. No mass. No midline shift. No hydrocephalus. The sella is unremarkable. Normal flow voids. ORBITS: No acute abnormality. SINUSES AND MASTOIDS: No acute abnormality. BONES AND SOFT TISSUES: Normal marrow signal. No acute soft tissue abnormality. IMPRESSION: 1. No acute intracranial abnormality. 2. Remote left posterior MCA territory infarct. Electronically signed by: Gilmore Molt 02/29/2024 02:00 AM EST RP Workstation: [id number]   CT ANGIO HEAD NECK W WO CM (CODE STROKE) Result Date: 02/28/2024 EXAM: CTA Head and Neck with Intravenous Contrast. CLINICAL HISTORY: Neuro deficit, acute, stroke suspected. TECHNIQUE:  Axial CTA images of the head and neck performed with intravenous contrast. MIP reconstructed images were created and reviewed. Note: Per PQRS, the description of internal carotid artery percent stenosis, including 0 percent or normal exam, is based on North American Symptomatic Carotid Endarterectomy Trial (NASCET) criteria. Dose reduction technique was used including one or more of the following: automated exposure control, adjustment of mA and kV according to patient size, and/or iterative reconstruction. CONTRAST: With; COMPARISON: None provided. FINDINGS: CTA NECK: COMMON CAROTID ARTERIES: No significant stenosis. No dissection or occlusion. INTERNAL CAROTID ARTERIES: No stenosis by NASCET criteria. Mild to moderate right  intracranial ICA stenosis. No dissection or occlusion. VERTEBRAL ARTERIES: No significant stenosis. No dissection or occlusion. CTA HEAD: ANTERIOR CEREBRAL ARTERIES: No significant stenosis. No occlusion. No aneurysm. MIDDLE CEREBRAL ARTERIES: No significant stenosis. No occlusion. No aneurysm. POSTERIOR CEREBRAL ARTERIES: Moderate right p2 PCA stenosis. No occlusion. No aneurysm. BASILAR ARTERY: No significant stenosis. No occlusion. No aneurysm. OTHER: SOFT TISSUES: No acute finding. No masses or lymphadenopathy. BONES: No acute osseous abnormality. IMPRESSION: 1. No large vessel occlusion. 2. Moderate right P2 PCA stenosis. 3. Mild to moderate right intracranial ICA stenosis. Electronically signed by: Gilmore Molt 02/28/2024 08:21 PM EST RP Workstation: [id number]   CT HEAD CODE STROKE WO CONTRAST Result Date: 02/28/2024 EXAM: CT HEAD WITHOUT CONTRAST 02/28/2024 07:34:13 PM TECHNIQUE: CT of the head was performed without the administration of intravenous contrast. Automated exposure control, iterative reconstruction, and/or weight based adjustment of the mA/kV was utilized to reduce the radiation dose to as low as reasonably achievable. COMPARISON: Head CT 02/03/2024 and MRI 08/26/2023. CLINICAL HISTORY: Neuro deficit, acute, stroke suspected. FINDINGS: BRAIN AND VENTRICLES: There is no evidence of an acute infarct, intracranial hemorrhage, mass, midline shift, hydrocephalus, or extra-axial fluid collection. A moderate sized chronic infarct is again noted in the posterior left MCA territory, and there is also an unchanged chronic lacunar infarct in the right thalamus. There is ex vacuo dilatation of the left lateral ventricle. There is mild cerebral atrophy. Mild hypodensities elsewhere in the cerebral white matter bilaterally are nonspecific but compatible with chronic small vessel ischemic disease. Calcified atherosclerosis at the skull base. ORBITS: No acute abnormality. SINUSES: Mild mucosal thickening  in the ethmoid sinuses. Clear mastoid air cells. SOFT TISSUES AND SKULL: Small right frontal scalp lipoma. No skull fracture. [place] Stroke Program Early CT Score (ASPECTS) ----- Ganglionic (caudate, IC, lentiform nucleus, insula, M1-M3): 7 Supraganglionic (M4-M6): 3 Total: 10 These results were communicated by telephone to Dr. Fernand on 02/28/2024 at 7:48 pm. IMPRESSION: 1. No acute intracranial abnormality.  ASPECTS of 10. 2. 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 Electronically signed by: Dasie Hamburg MD 02/28/2024 07:48 PM EST RP Workstation: [id number]   DG Hip Unilat W or Wo Pelvis 2-3 Views Right Result Date: 02/04/2024 EXAM: 2 or 3 VIEW(S) XRAY OF THE RIGHT HIP 02/04/2024 02:26:00 AM COMPARISON: 10/13/2023. CLINICAL HISTORY: fall, right sided pain FINDINGS: BONES AND JOINTS: Prior right hip replacement. Lucency in the greater trochanter is again noted, compatible with chronic greater trochanter fracture, unchanged. Irregularity noted in the left superior and inferior pubic rami compatible with acute fractures. No malalignment. SOFT TISSUES: The soft tissues are unremarkable. IMPRESSION: 1. Acute fractures of the left superior and inferior pubic rami. 2. Right hip arthroplasty with unchanged old greater trochanter fracture. Electronically signed by: Franky Crease MD 02/04/2024 02:33 AM EST RP Workstation: [id number]   DG Chest Portable 1 View Result Date: 02/04/2024 EXAM: 1 VIEW(S) XRAY OF THE CHEST  02/04/2024 02:26:00 AM COMPARISON: 10/08/2023. CLINICAL HISTORY: fall, right sided pain FINDINGS: LUNGS AND PLEURA: No focal pulmonary opacity. No pleural effusion. No pneumothorax. HEART AND MEDIASTINUM: No acute abnormality of the cardiac and mediastinal silhouettes. BONES AND SOFT TISSUES: No acute osseous abnormality. IMPRESSION: 1. No acute cardiopulmonary process identified. Electronically signed by: Franky Crease MD 02/04/2024 02:30 AM EST RP Workstation: [id number]   CT Cervical Spine Wo  Contrast Result Date: 02/03/2024 EXAM: CT CERVICAL SPINE WITHOUT CONTRAST 02/03/2024 08:34:48 PM TECHNIQUE: CT of the cervical spine was performed without the administration of intravenous contrast. Multiplanar reformatted images are provided for review. Automated exposure control, iterative reconstruction, and/or weight based adjustment of the mA/kV was utilized to reduce the radiation dose to as low as reasonably achievable. COMPARISON: None available. CLINICAL HISTORY: fall fall FINDINGS: CERVICAL SPINE: BONES AND ALIGNMENT: No acute fracture or traumatic malalignment. DEGENERATIVE CHANGES: C5-C6 moderate degenerative changes of the spine. SOFT TISSUES: No prevertebral soft tissue swelling. IMPRESSION: 1. No acute abnormality of the cervical spine. Electronically signed by: Morgane Naveau MD 02/03/2024 08:45 PM EST RP Workstation: [id number]   CT HEAD WO CONTRAST Result Date: 02/03/2024 EXAM: CT HEAD WITHOUT 02/03/2024 08:34:48 PM TECHNIQUE: CT of the head was performed without the administration of intravenous contrast. Automated exposure control, iterative reconstruction, and/or weight based adjustment of the mA/kV was utilized to reduce the radiation dose to as low as reasonably achievable. COMPARISON: 02/03/2024 CLINICAL HISTORY: Ataxia, acute, traumatic (Ped 0-17y) FINDINGS: BRAIN AND VENTRICLES: No acute intracranial hemorrhage. No mass effect or midline shift. No extra-axial fluid collection. No hydrocephalus. Patchy and confluent decreased attenuation throughout the deep and periventricular white matter of the cerebral hemispheres bilaterally, compatible with chronic microvascular ischemic disease. Chronic left occipital infarction. Atherosclerotic calcifications within the cavernous internal carotid arteries. ORBITS: No acute abnormality. SINUSES AND MASTOIDS: No acute abnormality. SOFT TISSUES AND SKULL: No acute skull fracture. 3 x 0.6cm right frontal scalp lipoma. IMPRESSION: 1. No acute  intracranial abnormality. 2. Right frontal scalp lipoma. Electronically signed by: Morgane Naveau MD 02/03/2024 08:44 PM EST RP Workstation: [id number]   CT Head Wo Contrast Result Date: 02/03/2024 CLINICAL DATA:  Tremors worsening today.  Recent fall. EXAM: CT HEAD WITHOUT CONTRAST TECHNIQUE: Contiguous axial images were obtained from the base of the skull through the vertex without intravenous contrast. RADIATION DOSE REDUCTION: This exam was performed according to the departmental dose-optimization program which includes automated exposure control, adjustment of the mA and/or kV according to patient size and/or use of iterative reconstruction technique. COMPARISON:  12/01/2023 FINDINGS: Brain: Ventricles and cisterns are normal. CSF spaces are unremarkable. Evidence of chronic ischemic microvascular disease. Old left posterior parietal/occipital watershed infarct. No mass, mass effect, shift of midline structures or acute hemorrhage. Vascular: No hyperdense vessel or unexpected calcification. Skull: Normal. Negative for fracture or focal lesion. Sinuses/Orbits: Orbits are normal symmetric. Hypoplastic frontal sinuses. Minimal opacification over the ethmoid air cells. Mastoid air cells are clear. Other: None. IMPRESSION: 1. No acute findings. 2. Chronic ischemic microvascular disease and old left posterior parietal/occipital watershed infarct. Electronically Signed   By: Toribio Agreste M.D.   On: 02/03/2024 15:28         "

## 2024-03-07 NOTE — Assessment & Plan Note (Addendum)
 Chronic anemia, with current hemoglobin close to his baseline.  Blood work results reviewed and discussed with patient.  Negative M protein on protein electrophoresis, adequate B12 folate, normal LDH and haptoglobin, Lab Results  Component Value Date   HGB 9.4 (L) 02/29/2024   TIBC 294 02/10/2024   IRONPCTSAT 11 (L) 02/10/2024   FERRITIN 51 02/10/2024    Anemia is likely secondary to chronic disease/kidney insufficiency. Recommend patient to start oral iron  supplementation Vitron-C 1 tablet daily. Consider iron  infusion if no improvement. Recommend bowel regimen for possible iron  induced constipation

## 2024-03-07 NOTE — Assessment & Plan Note (Signed)
 On Xarelto 

## 2024-03-07 NOTE — Assessment & Plan Note (Signed)
 Recommend alcohol cessation.

## 2024-03-07 NOTE — Assessment & Plan Note (Signed)
 Previous MRI abdomen showed a small pancreatic tail lesion.  I will check CA 19-9, neuroendocrine tumor markers at the next follow-up.

## 2024-03-27 ENCOUNTER — Emergency Department

## 2024-03-27 ENCOUNTER — Observation Stay

## 2024-03-27 ENCOUNTER — Encounter: Payer: Self-pay | Admitting: Emergency Medicine

## 2024-03-27 ENCOUNTER — Other Ambulatory Visit: Payer: Self-pay

## 2024-03-27 ENCOUNTER — Observation Stay: Admission: EM | Admit: 2024-03-27 | Discharge: 2024-03-29 | Disposition: A

## 2024-03-27 DIAGNOSIS — I10 Essential (primary) hypertension: Secondary | ICD-10-CM | POA: Diagnosis present

## 2024-03-27 DIAGNOSIS — Z7901 Long term (current) use of anticoagulants: Secondary | ICD-10-CM | POA: Insufficient documentation

## 2024-03-27 DIAGNOSIS — R5381 Other malaise: Secondary | ICD-10-CM | POA: Insufficient documentation

## 2024-03-27 DIAGNOSIS — R569 Unspecified convulsions: Secondary | ICD-10-CM | POA: Insufficient documentation

## 2024-03-27 DIAGNOSIS — R001 Bradycardia, unspecified: Secondary | ICD-10-CM

## 2024-03-27 DIAGNOSIS — R4182 Altered mental status, unspecified: Secondary | ICD-10-CM | POA: Insufficient documentation

## 2024-03-27 DIAGNOSIS — N400 Enlarged prostate without lower urinary tract symptoms: Secondary | ICD-10-CM | POA: Diagnosis present

## 2024-03-27 DIAGNOSIS — Y92009 Unspecified place in unspecified non-institutional (private) residence as the place of occurrence of the external cause: Secondary | ICD-10-CM | POA: Insufficient documentation

## 2024-03-27 DIAGNOSIS — G9341 Metabolic encephalopathy: Secondary | ICD-10-CM | POA: Insufficient documentation

## 2024-03-27 DIAGNOSIS — I480 Paroxysmal atrial fibrillation: Secondary | ICD-10-CM | POA: Insufficient documentation

## 2024-03-27 DIAGNOSIS — W19XXXA Unspecified fall, initial encounter: Secondary | ICD-10-CM | POA: Diagnosis present

## 2024-03-27 DIAGNOSIS — N183 Chronic kidney disease, stage 3 unspecified: Secondary | ICD-10-CM | POA: Diagnosis present

## 2024-03-27 DIAGNOSIS — F1090 Alcohol use, unspecified, uncomplicated: Secondary | ICD-10-CM | POA: Insufficient documentation

## 2024-03-27 DIAGNOSIS — I130 Hypertensive heart and chronic kidney disease with heart failure and stage 1 through stage 4 chronic kidney disease, or unspecified chronic kidney disease: Secondary | ICD-10-CM | POA: Insufficient documentation

## 2024-03-27 DIAGNOSIS — Z8673 Personal history of transient ischemic attack (TIA), and cerebral infarction without residual deficits: Secondary | ICD-10-CM | POA: Insufficient documentation

## 2024-03-27 DIAGNOSIS — Z87891 Personal history of nicotine dependence: Secondary | ICD-10-CM | POA: Insufficient documentation

## 2024-03-27 DIAGNOSIS — E785 Hyperlipidemia, unspecified: Secondary | ICD-10-CM | POA: Diagnosis present

## 2024-03-27 DIAGNOSIS — W010XXA Fall on same level from slipping, tripping and stumbling without subsequent striking against object, initial encounter: Secondary | ICD-10-CM | POA: Insufficient documentation

## 2024-03-27 DIAGNOSIS — I5032 Chronic diastolic (congestive) heart failure: Secondary | ICD-10-CM | POA: Diagnosis present

## 2024-03-27 DIAGNOSIS — Z79899 Other long term (current) drug therapy: Secondary | ICD-10-CM | POA: Insufficient documentation

## 2024-03-27 DIAGNOSIS — S32030A Wedge compression fracture of third lumbar vertebra, initial encounter for closed fracture: Principal | ICD-10-CM | POA: Diagnosis present

## 2024-03-27 LAB — COMPREHENSIVE METABOLIC PANEL WITH GFR
ALT: 17 U/L (ref 0–44)
AST: 24 U/L (ref 15–41)
Albumin: 3.9 g/dL (ref 3.5–5.0)
Alkaline Phosphatase: 119 U/L (ref 38–126)
Anion gap: 11 (ref 5–15)
BUN: 18 mg/dL (ref 8–23)
CO2: 26 mmol/L (ref 22–32)
Calcium: 9.2 mg/dL (ref 8.9–10.3)
Chloride: 108 mmol/L (ref 98–111)
Creatinine, Ser: 1 mg/dL (ref 0.61–1.24)
GFR, Estimated: >60 mL/min
Glucose, Bld: 98 mg/dL (ref 70–99)
Potassium: 3.7 mmol/L (ref 3.5–5.1)
Sodium: 145 mmol/L (ref 135–145)
Total Bilirubin: 0.3 mg/dL (ref 0.0–1.2)
Total Protein: 6.7 g/dL (ref 6.5–8.1)

## 2024-03-27 LAB — URINALYSIS, ROUTINE W REFLEX MICROSCOPIC
Bacteria, UA: NONE SEEN
Bilirubin Urine: NEGATIVE
Glucose, UA: NEGATIVE mg/dL
Ketones, ur: NEGATIVE mg/dL
Leukocytes,Ua: NEGATIVE
Nitrite: NEGATIVE
Protein, ur: 30 mg/dL — AB
Specific Gravity, Urine: 1.014 (ref 1.005–1.030)
Squamous Epithelial / HPF: 0 /HPF (ref 0–5)
pH: 5 (ref 5.0–8.0)

## 2024-03-27 LAB — URINE DRUG SCREEN
Amphetamines: NEGATIVE
Barbiturates: NEGATIVE
Benzodiazepines: NEGATIVE
Cocaine: NEGATIVE
Fentanyl: NEGATIVE
Methadone Scn, Ur: NEGATIVE
Opiates: NEGATIVE
Tetrahydrocannabinol: NEGATIVE

## 2024-03-27 LAB — CBC WITH DIFFERENTIAL/PLATELET
Abs Immature Granulocytes: 0.02 10*3/uL (ref 0.00–0.07)
Basophils Absolute: 0.1 10*3/uL (ref 0.0–0.1)
Basophils Relative: 1 %
Eosinophils Absolute: 0.2 10*3/uL (ref 0.0–0.5)
Eosinophils Relative: 2 %
HCT: 30.6 % — ABNORMAL LOW (ref 39.0–52.0)
Hemoglobin: 9.6 g/dL — ABNORMAL LOW (ref 13.0–17.0)
Immature Granulocytes: 0 %
Lymphocytes Relative: 24 %
Lymphs Abs: 1.9 10*3/uL (ref 0.7–4.0)
MCH: 26.3 pg (ref 26.0–34.0)
MCHC: 31.4 g/dL (ref 30.0–36.0)
MCV: 83.8 fL (ref 80.0–100.0)
Monocytes Absolute: 0.8 10*3/uL (ref 0.1–1.0)
Monocytes Relative: 10 %
Neutro Abs: 4.8 10*3/uL (ref 1.7–7.7)
Neutrophils Relative %: 63 %
Platelets: 195 10*3/uL (ref 150–400)
RBC: 3.65 MIL/uL — ABNORMAL LOW (ref 4.22–5.81)
RDW: 15.6 % — ABNORMAL HIGH (ref 11.5–15.5)
WBC: 7.7 10*3/uL (ref 4.0–10.5)
nRBC: 0 % (ref 0.0–0.2)

## 2024-03-27 LAB — ETHANOL: Alcohol, Ethyl (B): <15 mg/dL

## 2024-03-27 LAB — MAGNESIUM: Magnesium: 1.4 mg/dL — ABNORMAL LOW (ref 1.7–2.4)

## 2024-03-27 LAB — TROPONIN T, HIGH SENSITIVITY: Troponin T High Sensitivity: 17 ng/L (ref 0–19)

## 2024-03-27 LAB — PHOSPHORUS: Phosphorus: 3 mg/dL (ref 2.5–4.6)

## 2024-03-27 LAB — AMMONIA: Ammonia: 18 umol/L (ref 9–35)

## 2024-03-27 MED ORDER — TAMSULOSIN HCL 0.4 MG PO CAPS
0.4000 mg | ORAL_CAPSULE | Freq: Every day | ORAL | Status: DC
  Administered 2024-03-28 (×2): 0.4 mg via ORAL
  Filled 2024-03-27 (×2): qty 1

## 2024-03-27 MED ORDER — LEVETIRACETAM 500 MG PO TABS
1500.0000 mg | ORAL_TABLET | Freq: Two times a day (BID) | ORAL | Status: DC
  Administered 2024-03-28 – 2024-03-29 (×3): 1500 mg via ORAL
  Filled 2024-03-27 (×3): qty 3

## 2024-03-27 MED ORDER — LORAZEPAM 1 MG PO TABS: 1.0000 mg | ORAL_TABLET | ORAL | Status: DC | PRN

## 2024-03-27 MED ORDER — THIAMINE HCL 100 MG/ML IJ SOLN
500.0000 mg | Freq: Three times a day (TID) | INTRAVENOUS | Status: DC
  Administered 2024-03-28: 500 mg via INTRAVENOUS
  Filled 2024-03-27 (×3): qty 5

## 2024-03-27 MED ORDER — SENNOSIDES-DOCUSATE SODIUM 8.6-50 MG PO TABS: 1.0000 | ORAL_TABLET | Freq: Every evening | ORAL | Status: DC | PRN

## 2024-03-27 MED ORDER — ONDANSETRON HCL 4 MG PO TABS: 4.0000 mg | ORAL_TABLET | Freq: Four times a day (QID) | ORAL | Status: DC | PRN

## 2024-03-27 MED ORDER — RIVAROXABAN 20 MG PO TABS
20.0000 mg | ORAL_TABLET | Freq: Every day | ORAL | Status: DC
  Administered 2024-03-28: 20 mg via ORAL
  Filled 2024-03-27 (×2): qty 1

## 2024-03-27 MED ORDER — ACETAMINOPHEN 325 MG PO TABS
650.0000 mg | ORAL_TABLET | Freq: Four times a day (QID) | ORAL | Status: DC | PRN
  Administered 2024-03-29: 650 mg via ORAL
  Filled 2024-03-27: qty 2

## 2024-03-27 MED ORDER — IOHEXOL 300 MG/ML  SOLN
100.0000 mL | Freq: Once | INTRAMUSCULAR | Status: AC | PRN
  Administered 2024-03-27: 100 mL via INTRAVENOUS

## 2024-03-27 MED ORDER — ATORVASTATIN CALCIUM 20 MG PO TABS
40.0000 mg | ORAL_TABLET | Freq: Every day | ORAL | Status: DC
  Administered 2024-03-28 (×2): 40 mg via ORAL
  Filled 2024-03-27 (×2): qty 2

## 2024-03-27 MED ORDER — ADULT MULTIVITAMIN W/MINERALS CH
1.0000 | ORAL_TABLET | Freq: Every day | ORAL | Status: DC
  Administered 2024-03-28 – 2024-03-29 (×2): 1 via ORAL
  Filled 2024-03-27 (×2): qty 1

## 2024-03-27 MED ORDER — AMLODIPINE BESYLATE 5 MG PO TABS
10.0000 mg | ORAL_TABLET | Freq: Every day | ORAL | Status: DC
  Administered 2024-03-28 (×2): 10 mg via ORAL
  Filled 2024-03-27: qty 2
  Filled 2024-03-27: qty 1

## 2024-03-27 MED ORDER — IBUPROFEN 600 MG PO TABS
600.0000 mg | ORAL_TABLET | Freq: Once | ORAL | Status: AC
  Administered 2024-03-27: 600 mg via ORAL
  Filled 2024-03-27: qty 1

## 2024-03-27 MED ORDER — ACETAMINOPHEN 650 MG RE SUPP: 650.0000 mg | Freq: Four times a day (QID) | RECTAL | Status: DC | PRN

## 2024-03-27 MED ORDER — ONDANSETRON HCL 4 MG/2ML IJ SOLN: 4.0000 mg | Freq: Four times a day (QID) | INTRAMUSCULAR | Status: DC | PRN

## 2024-03-27 MED ORDER — THIAMINE HCL 100 MG/ML IJ SOLN
500.0000 mg | Freq: Once | INTRAVENOUS | Status: AC
  Administered 2024-03-27: 500 mg via INTRAVENOUS
  Filled 2024-03-27: qty 5

## 2024-03-27 MED ORDER — CARVEDILOL 6.25 MG PO TABS
12.5000 mg | ORAL_TABLET | Freq: Two times a day (BID) | ORAL | Status: DC
  Administered 2024-03-28 (×2): 12.5 mg via ORAL
  Filled 2024-03-27 (×3): qty 2

## 2024-03-27 MED ORDER — LACTATED RINGERS IV SOLN: INTRAVENOUS | Status: DC

## 2024-03-27 MED ORDER — ADULT MULTIVITAMIN W/MINERALS CH
1.0000 | ORAL_TABLET | Freq: Every day | ORAL | Status: DC
  Administered 2024-03-27: 1 via ORAL
  Filled 2024-03-27: qty 1

## 2024-03-27 MED ORDER — MAGNESIUM SULFATE 4 GM/100ML IV SOLN
4.0000 g | Freq: Once | INTRAVENOUS | Status: AC
  Administered 2024-03-27: 4 g via INTRAVENOUS
  Filled 2024-03-27: qty 100

## 2024-03-27 MED ORDER — SODIUM CHLORIDE 0.9 % IV SOLN
1.0000 mg | Freq: Once | INTRAVENOUS | Status: AC
  Administered 2024-03-27: 1 mg via INTRAVENOUS
  Filled 2024-03-27: qty 0.2

## 2024-03-27 MED ORDER — LACOSAMIDE 50 MG PO TABS
200.0000 mg | ORAL_TABLET | Freq: Two times a day (BID) | ORAL | Status: DC
  Administered 2024-03-28 – 2024-03-29 (×3): 200 mg via ORAL
  Filled 2024-03-27 (×3): qty 4

## 2024-03-27 MED ORDER — LORAZEPAM 2 MG/ML IJ SOLN: 1.0000 mg | INTRAMUSCULAR | Status: DC | PRN

## 2024-03-27 MED ORDER — FOLIC ACID 1 MG PO TABS
1.0000 mg | ORAL_TABLET | Freq: Every day | ORAL | Status: DC
  Administered 2024-03-28 – 2024-03-29 (×2): 1 mg via ORAL
  Filled 2024-03-27 (×2): qty 1

## 2024-03-27 MED ORDER — SODIUM CHLORIDE 0.9% FLUSH
3.0000 mL | Freq: Two times a day (BID) | INTRAVENOUS | Status: DC
  Administered 2024-03-28 – 2024-03-29 (×3): 3 mL via INTRAVENOUS

## 2024-03-27 MED ORDER — THIAMINE HCL 100 MG/ML IJ SOLN
500.0000 mg | Freq: Three times a day (TID) | INTRAVENOUS | Status: DC
  Filled 2024-03-27: qty 5

## 2024-03-27 NOTE — ED Triage Notes (Signed)
 Pt with fall yesterday, left hip and shoulder pain.  Was able to walk since then.  Pt is not on thinners.  No LOC

## 2024-03-27 NOTE — ED Notes (Signed)
 Pt taken to x-ray but they were unable to get films at this time.  Pt is confused and changes which side he is reporting as injured/painful.  Rad unable to know for sure what films to get therefore wanted to wait until he is evaluated by a provider.

## 2024-03-27 NOTE — ED Notes (Signed)
 First Nurse Note: Pt to ED via [facility] from home for a fall. Pt c/o left hip and shoulder pain. Per pt wife, pt tripped over the chair. Pt did not have LOC and is not on blood thinners. Pt was able to walk after the fall. CBG 114.  BP- 177/75 18 G RAC

## 2024-03-27 NOTE — ED Provider Notes (Signed)
 "  [facility] Provider Note    Event Date/Time   First MD Initiated Contact with Patient 03/27/24 [phone]     (approximate)   History   Fall   HPI  John Spears is a 74 y.o. male with history of alcohol abuse, seizures, CKD, CHF, TIA, CVA, atrial fibrillation not on blood thinner, transaminitis and as listed in EMR presents to the emergency department for treatment and evaluation after sustaining a fall at home today.  He is complaining of left shoulder pain, left lateral rib pain, and right thigh pain.  He denies striking his head or losing consciousness. He states that he does use his walker, but has had multiple falls recently. Patient lives with his older sister.      Physical Exam    Vitals:   03/27/24 2000 03/27/24 2100  BP: (!) 149/70 (!) 159/72  Pulse: (!) 51   Resp: 16   Temp:    SpO2: 100%     General: Awake, no distress.  CV:  Good peripheral perfusion.  Resp:  Normal effort.  Abd:  No distention.  Other:  Ecchymosis in area of left axilla. Contusion noted over right mid anterior thigh. Patient able to demonstrate flexion and extension of right knee. No focal pain.    Follows commands. No focal neurological deficits.    ED Results / Procedures / Treatments   Labs (all labs ordered are listed, but only abnormal results are displayed)  Labs Reviewed  URINALYSIS, ROUTINE W REFLEX MICROSCOPIC - Abnormal; Notable for the following components:      Result Value   Color, Urine STRAW (*)    APPearance CLEAR (*)    Hgb urine dipstick SMALL (*)    Protein, ur 30 (*)    All other components within normal limits  CBC WITH DIFFERENTIAL/PLATELET - Abnormal; Notable for the following components:   RBC 3.65 (*)    Hemoglobin 9.6 (*)    HCT 30.6 (*)    RDW 15.6 (*)    All other components within normal limits  MAGNESIUM  - Abnormal; Notable for the following components:   Magnesium  1.4 (*)    All other components within normal limits   COMPREHENSIVE METABOLIC PANEL WITH GFR  AMMONIA  PHOSPHORUS  URINE DRUG SCREEN  ETHANOL  TROPONIN T, HIGH SENSITIVITY     EKG  Sinus rhythm with a rate of 58.  Bifascicular block.  No significant change from previous dated 03/06/2024.   RADIOLOGY  Image and radiology report reviewed and interpreted by me. Radiology report consistent with the same.  X-ray image of right femur and left shoulder negative for acute concerns.  PROCEDURES:  Critical Care performed: Yes, see critical care procedure note(s)  .Critical Care  Performed by: Herlinda Kirk NOVAK, FNP Authorized by: Herlinda Kirk NOVAK, FNP   Critical care provider statement:    Critical care time (minutes):  45   Critical care time was exclusive of:  Separately billable procedures and treating other patients   Critical care was time spent personally by me on the following activities:  Development of treatment plan with patient or surrogate, discussions with consultants, evaluation of patient's response to treatment, examination of patient, obtaining history from patient or surrogate, review of old charts, ordering and review of radiographic studies, ordering and review of laboratory studies and ordering and performing treatments and interventions   Care discussed with: admitting provider      MEDICATIONS ORDERED IN ED:  Medications  LORazepam  (ATIVAN )  tablet 1-4 mg (has no administration in time range)    Or  LORazepam  (ATIVAN ) injection 1-4 mg (has no administration in time range)  multivitamin with minerals tablet 1 tablet (1 tablet Oral Given 03/27/24 2104)  magnesium  sulfate IVPB 4 g 100 mL (has no administration in time range)  folic acid  1 mg in sodium chloride  0.9 % 50 mL IVPB (0 mg Intravenous Stopped 03/27/24 2122)  thiamine  (VITAMIN B1) 500 mg in sodium chloride  0.9 % 50 mL IVPB (0 mg Intravenous Stopped 03/27/24 2012)  iohexol  (OMNIPAQUE ) 300 MG/ML solution 100 mL (100 mLs Intravenous Contrast Given 03/27/24 2010)   ibuprofen  (ADVIL ) tablet 600 mg (600 mg Oral Given 03/27/24 2104)     IMPRESSION / MDM / ASSESSMENT AND PLAN / ED COURSE   I have reviewed the triage note and vital signs. Vital signs stable.   Differential diagnosis includes, but is not limited to,   Patient's presentation is most consistent with acute presentation with potential threat to life or bodily function.  The patient is on the cardiac monitor to evaluate for evidence of arrhythmia and/or significant heart rate changes.  74 year old male presenting to the emergency department for treatment and evaluation after multiple falls with the last being this morning.  See HPI for further details.  Patient is somewhat confused but follows commands and is cooperative.  He is a poor historian and unable to describe in any detail about his falls.   Based on review of chart, he has a significant history of alcoholism and metabolic encephalopathy.  Clinical Course as of 03/27/24 2231  Mon Mar 27, 2024  8182 Patient seems more confused than upon initial exam. Concern for Wernicke's/alcohol withdrawal.  High-dose thiamine  ordered in addition to CIWA protocol.  Awaiting lab results and CT imaging studies.  Patient will likely need inpatient treatment.  ED attending, Dr. Nicholaus has also evaluated patient and is in agreement. [CT]  2102 Labs indicate a stable anemia with a hemoglobin of 9.6 and hematocrit of 30.6.  CMP is normal.  Magnesium  is down to 1.4.  Phosphorus is normal at 3.  Urine shows a clear specimen with a small amount of hemoglobin and 30 of protein.  Urine drug screen is negative.  Troponin is normal.  Ammonia is normal.  EKG is unchanged from previous.  Imaging of the right femur and left shoulder are negative for acute concerns.  CT images of the head, cervical spine, chest, abdomen and pelvis were all negative for acute findings.  Due to patient's level of confusion, frequent falls, and possible Wernicke's/encephalopathy plan will  be to discuss with hospitalist for admission.  Patient is agreeable to the plan. [CT]    Clinical Course User Index [CT] Yanci Bachtell B, FNP     FINAL CLINICAL IMPRESSION(S) / ED DIAGNOSES   Final diagnoses:  Closed compression fracture of L3 lumbar vertebra, initial encounter (HCC)  Bradycardia  Altered mental status, unspecified altered mental status type     Rx / DC Orders   ED Discharge Orders     None        Note:  This document was prepared using Dragon voice recognition software and may include unintentional dictation errors.   Herlinda Kirk NOVAK, FNP 03/27/24 2231  "

## 2024-03-28 ENCOUNTER — Encounter: Payer: Self-pay | Admitting: Internal Medicine

## 2024-03-28 DIAGNOSIS — W19XXXA Unspecified fall, initial encounter: Secondary | ICD-10-CM

## 2024-03-28 DIAGNOSIS — S32030A Wedge compression fracture of third lumbar vertebra, initial encounter for closed fracture: Secondary | ICD-10-CM | POA: Diagnosis not present

## 2024-03-28 LAB — MAGNESIUM: Magnesium: 2.6 mg/dL — ABNORMAL HIGH (ref 1.7–2.4)

## 2024-03-28 LAB — CBC
HCT: 28.7 % — ABNORMAL LOW (ref 39.0–52.0)
Hemoglobin: 8.9 g/dL — ABNORMAL LOW (ref 13.0–17.0)
MCH: 25.9 pg — ABNORMAL LOW (ref 26.0–34.0)
MCHC: 31 g/dL (ref 30.0–36.0)
MCV: 83.7 fL (ref 80.0–100.0)
Platelets: 186 10*3/uL (ref 150–400)
RBC: 3.43 MIL/uL — ABNORMAL LOW (ref 4.22–5.81)
RDW: 15.7 % — ABNORMAL HIGH (ref 11.5–15.5)
WBC: 6.5 10*3/uL (ref 4.0–10.5)
nRBC: 0 % (ref 0.0–0.2)

## 2024-03-28 LAB — BASIC METABOLIC PANEL WITH GFR
Anion gap: 8 (ref 5–15)
BUN: 18 mg/dL (ref 8–23)
CO2: 27 mmol/L (ref 22–32)
Calcium: 8.9 mg/dL (ref 8.9–10.3)
Chloride: 107 mmol/L (ref 98–111)
Creatinine, Ser: 1.03 mg/dL (ref 0.61–1.24)
GFR, Estimated: >60 mL/min
Glucose, Bld: 113 mg/dL — ABNORMAL HIGH (ref 70–99)
Potassium: 3.3 mmol/L — ABNORMAL LOW (ref 3.5–5.1)
Sodium: 142 mmol/L (ref 135–145)

## 2024-03-28 LAB — SYPHILIS: RPR W/REFLEX TO RPR TITER AND TREPONEMAL ANTIBODIES, TRADITIONAL SCREENING AND DIAGNOSIS ALGORITHM: RPR Ser Ql: NONREACTIVE

## 2024-03-28 LAB — VITAMIN B12: Vitamin B-12: 441 pg/mL (ref 180–914)

## 2024-03-28 MED ORDER — CARVEDILOL 6.25 MG PO TABS
12.5000 mg | ORAL_TABLET | Freq: Once | ORAL | Status: DC
  Filled 2024-03-28: qty 2

## 2024-03-28 MED ORDER — LEVETIRACETAM 500 MG PO TABS
1500.0000 mg | ORAL_TABLET | Freq: Once | ORAL | Status: AC
  Administered 2024-03-28: 1500 mg via ORAL
  Filled 2024-03-28: qty 3

## 2024-03-28 MED ORDER — RIVAROXABAN 20 MG PO TABS
20.0000 mg | ORAL_TABLET | Freq: Every day | ORAL | Status: DC
  Filled 2024-03-28: qty 1

## 2024-03-28 MED ORDER — THIAMINE MONONITRATE 100 MG PO TABS
100.0000 mg | ORAL_TABLET | Freq: Every day | ORAL | Status: DC
  Administered 2024-03-28 – 2024-03-29 (×2): 100 mg via ORAL
  Filled 2024-03-28 (×2): qty 1

## 2024-03-28 MED ORDER — LACOSAMIDE 50 MG PO TABS
200.0000 mg | ORAL_TABLET | Freq: Once | ORAL | Status: AC
  Administered 2024-03-28: 200 mg via ORAL
  Filled 2024-03-28: qty 4

## 2024-03-28 MED ORDER — POTASSIUM CHLORIDE 20 MEQ PO PACK
20.0000 meq | PACK | Freq: Once | ORAL | Status: AC
  Administered 2024-03-28: 20 meq via ORAL
  Filled 2024-03-28: qty 1

## 2024-03-28 MED ORDER — LACTATED RINGERS IV SOLN: INTRAVENOUS | Status: AC

## 2024-03-28 NOTE — ED Notes (Signed)
 Therapy walking with patient at this time

## 2024-03-28 NOTE — ED Notes (Signed)
Pt given snack per his request 

## 2024-03-28 NOTE — Evaluation (Signed)
 Physical Therapy Evaluation Patient Details Name: Reda Gettis MRN: [id number] DOB: Feb 06, 1951 Today's Date: 03/28/2024  History of Present Illness  74 y.o. year old male with medical history of hypertension, hyperlipidemia, seizure disorder, CHF, CKD 3a, paroxysmal atrial fibrillation presenting to the ED after a fall. CBC without leukocytosis and baseline anemia. CMP unremarkable. Troponin checked and normal. Ammonia checked and normal. UA without signs of infection.  Clinical Impression   Patient noted to be in supine position at PT arrival in room, for an initial PT evaluation due to a decline in functional status, with baseline mobility reported as needing assistance for mobility, and currently requiring close contact for ambulation distance of 160' feet. The patient is A&O x 4 with poor safety awareness, presenting with good willingness to work with PT. The patient resides in a house and lives with sister with family/friend support. There are 3 STE inside the residence. The overall clinical impression is that the patient presents with mild to moderate mobility limitations. Recommended skilled PT will address safety, mobility, and discharge planning.        If plan is discharge home, recommend the following: A little help with walking and/or transfers;Help with stairs or ramp for entrance;Assistance with cooking/housework;Assist for transportation   Can travel by private vehicle        Equipment Recommendations None recommended by PT  Recommendations for Other Services       Functional Status Assessment Patient has had a recent decline in their functional status and demonstrates the ability to make significant improvements in function in a reasonable and predictable amount of time.     Precautions / Restrictions Precautions Precautions: Fall Required Braces or Orthoses: Spinal Brace Spinal Brace: Thoracolumbosacral orthotic      Mobility  Bed Mobility Overal bed mobility:  Needs Assistance Bed Mobility: Supine to Sit, Sit to Supine     Supine to sit: Supervision Sit to supine: Supervision        Transfers Overall transfer level: Needs assistance Equipment used: Rolling walker (2 wheels) Transfers: Sit to/from Stand Sit to Stand: Min assist           General transfer comment: very unstable with initial standing    Ambulation/Gait Ambulation/Gait assistance: Min assist, Contact guard assist Gait Distance (Feet): 160 Feet Assistive device: Rolling walker (2 wheels) Gait Pattern/deviations: Step-to pattern, Step-through pattern, Shuffle, Knees buckling, Decreased stride length Gait velocity: decreased     General Gait Details: very unstable throughout; multiple vc needed for safety awareness and movement sequence to maintain safety  Stairs            Wheelchair Mobility     Tilt Bed    Modified Rankin (Stroke Patients Only)       Balance Overall balance assessment: Needs assistance Sitting-balance support: Feet supported Sitting balance-Leahy Scale: Normal     Standing balance support: During functional activity, Reliant on assistive device for balance Standing balance-Leahy Scale: Fair Standing balance comment: unstable during static standing                             Pertinent Vitals/Pain      Home Living Family/patient expects to be discharged to:: Private residence Living Arrangements: Other relatives Available Help at Discharge: Family;Available 24 hours/day Type of Home: House Home Access: Stairs to enter Entrance Stairs-Rails: Right Entrance Stairs-Number of Steps: 3   Home Layout: One level Home Equipment: Shower seat;Grab bars - tub/shower;Rolling Environmental Consultant (2 wheels)  Prior Function Prior Level of Function : History of Falls (last six months);Needs assist             Mobility Comments: ambulates without AD in home; RW in community; several falls within last 6 months ADLs Comments:  Pt reports being ind with self care tasks and sister performs all IADLs.     Extremity/Trunk Assessment   Upper Extremity Assessment Upper Extremity Assessment: Generalized weakness    Lower Extremity Assessment Lower Extremity Assessment: Generalized weakness    Cervical / Trunk Assessment Cervical / Trunk Assessment: Kyphotic  Communication   Communication Communication: Impaired Factors Affecting Communication: Difficulty expressing self;Reduced clarity of speech    Cognition Arousal: Alert Behavior During Therapy: WFL for tasks assessed/performed   PT - Cognitive impairments: Safety/Judgement, Problem solving, No apparent impairments                         Following commands: Impaired Following commands impaired: Only follows one step commands consistently     Cueing Cueing Techniques: Verbal cues     General Comments      Exercises     Assessment/Plan    PT Assessment Patient needs continued PT services  PT Problem List Decreased strength;Decreased activity tolerance;Decreased balance;Decreased mobility;Decreased coordination       PT Treatment Interventions Gait training;Stair training;Functional mobility training;Therapeutic activities;Therapeutic exercise;Patient/family education;Neuromuscular re-education;Balance training    PT Goals (Current goals can be found in the Care Plan section)  Acute Rehab PT Goals Patient Stated Goal: wants to go home PT Goal Formulation: With patient Time For Goal Achievement: 04/11/24 Potential to Achieve Goals: Good    Frequency Min 2X/week     Co-evaluation               AM-PAC PT 6 Clicks Mobility  Outcome Measure Help needed turning from your back to your side while in a flat bed without using bedrails?: None Help needed moving from lying on your back to sitting on the side of a flat bed without using bedrails?: None Help needed moving to and from a bed to a chair (including a wheelchair)?:  None Help needed standing up from a chair using your arms (e.g., wheelchair or bedside chair)?: None Help needed to walk in hospital room?: A Little Help needed climbing 3-5 steps with a railing? : A Lot 6 Click Score: 21    End of Session Equipment Utilized During Treatment: Gait belt Activity Tolerance: Patient tolerated treatment well Patient left: in bed;with call bell/phone within reach;with bed alarm set Nurse Communication: Mobility status PT Visit Diagnosis: Other abnormalities of gait and mobility (R26.89);Muscle weakness (generalized) (M62.81);Difficulty in walking, not elsewhere classified (R26.2)    Time: 8669-8648 PT Time Calculation (min) (ACUTE ONLY): 21 min   Charges:   PT Evaluation $PT Eval Low Complexity: 1 Low   PT General Charges $$ ACUTE PT VISIT: 1 Visit         Sherlean Lesches DPT, PT     Latysha Thackston A Lucille Witts 03/28/2024, 2:10 PM

## 2024-03-28 NOTE — ED Notes (Signed)
Assisted pt with urinal use

## 2024-03-28 NOTE — ED Notes (Signed)
 Requested Thiamine  IVP to be tubed from pharmacy.

## 2024-03-28 NOTE — Evaluation (Addendum)
 Occupational Therapy Evaluation Patient Details Name: John Spears MRN: [id number] DOB: 1950-09-10 Today's Date: 03/28/2024   History of Present Illness   74 y.o. year old male with medical history of hypertension, hyperlipidemia, seizure disorder, CHF, CKD 3a, paroxysmal atrial fibrillation presenting to the ED after a fall. CBC without leukocytosis and baseline anemia. CMP unremarkable. Troponin checked and normal. Ammonia checked and normal. UA without signs of infection.     Clinical Impressions Pt was seen for OT evaluation this date. PTA, pt resides at home with his sister and reports being MOD I with ADLs and mobility using a RW for household distances. He has had several falls with 3 occurring in the last few weeks.  Pt presents with deficits in strength, balance, safety awareness, pain and activity tolerance limiting their ability to perform ADL management at baseline level. Pt currently requires CGA to supervision for bed mobility tasks with cues for log roll technique. He demo LB dressing to doff/donn socks seated at EOB with CGA/SBA with cues for back precautions. He performed STS from EOB to RW with Min/CGA for safety with initial unsteadiness that improved slightly during ambulation, however required constant external support to maintain safety+ BUE support on RW, although no LOB noted. BUE ROM WFL and strength 3+/5 with reported bil shoulder pain and stiffness. Will follow acutely to maximize strength and indep to return home safely. Do anticipate the need for follow up OT services upon acute hospital DC.      If plan is discharge home, recommend the following:   A little help with walking and/or transfers;A little help with bathing/dressing/bathroom;A lot of help with bathing/dressing/bathroom;Assistance with cooking/housework;Help with stairs or ramp for entrance     Functional Status Assessment   Patient has had a recent decline in their functional status and  demonstrates the ability to make significant improvements in function in a reasonable and predictable amount of time.     Equipment Recommendations   BSC/3in1     Recommendations for Other Services         Precautions/Restrictions   Precautions Precautions: Fall Required Braces or Orthoses: Spinal Brace Spinal Brace:  (LSO for comfort) Restrictions Other Position/Activity Restrictions: LSO for comfort     Mobility Bed Mobility Overal bed mobility: Needs Assistance Bed Mobility: Rolling, Sidelying to Sit, Sit to Supine Rolling: Supervision, Used rails Sidelying to sit: Contact guard assist, Used rails   Sit to supine: Supervision   General bed mobility comments: cues for log roll technique    Transfers Overall transfer level: Needs assistance Equipment used: Rolling walker (2 wheels) Transfers: Sit to/from Stand Sit to Stand: Min assist, Contact guard assist           General transfer comment: cues for safety and hand placement, progressed mobility ~80 ft using RW + CGA for safety      Balance Overall balance assessment: Needs assistance Sitting-balance support: Feet supported Sitting balance-Leahy Scale: Normal     Standing balance support: During functional activity, Reliant on assistive device for balance, Bilateral upper extremity supported Standing balance-Leahy Scale: Fair Standing balance comment: RW + external support for safety                           ADL either performed or assessed with clinical judgement   ADL Overall ADL's : Needs assistance/impaired                     Lower Body  Dressing: Supervision/safety;Sitting/lateral leans;Contact guard assist;Cueing for back precautions Lower Body Dressing Details (indicate cue type and reason): to doff/donn socks with increased time and effort while seated EOB with cues for back precautions             Functional mobility during ADLs: Contact guard assist;Rolling  walker (2 wheels)       Vision         Perception         Praxis         Pertinent Vitals/Pain Pain Assessment Pain Assessment: Faces Faces Pain Scale: Hurts little more Pain Location: bil shoulders and R side/flank Pain Intervention(s): Monitored during session, Repositioned     Extremity/Trunk Assessment Upper Extremity Assessment Upper Extremity Assessment: Generalized weakness   Lower Extremity Assessment Lower Extremity Assessment: Generalized weakness   Cervical / Trunk Assessment Cervical / Trunk Assessment: Kyphotic   Communication Communication Communication: Impaired Factors Affecting Communication: Difficulty expressing self;Reduced clarity of speech   Cognition Arousal: Alert Behavior During Therapy: WFL for tasks assessed/performed                                 Following commands: Impaired Following commands impaired: Only follows one step commands consistently     Cueing  General Comments   Cueing Techniques: Verbal cues      Exercises Other Exercises Other Exercises: Edu on role of OT in acute setting.   Shoulder Instructions      Home Living Family/patient expects to be discharged to:: Private residence Living Arrangements: Other relatives Available Help at Discharge: Family;Available 24 hours/day Type of Home: House Home Access: Stairs to enter [employer] of Steps: 3 Entrance Stairs-Rails: Right Home Layout: One level     Bathroom Shower/Tub: Walk-in shower;Sponge bathes at baseline   [employer]: Standard Bathroom Accessibility: Yes   Home Equipment: Shower seat;Grab bars - tub/shower;Rolling Walker (2 wheels)          Prior Functioning/Environment Prior Level of Function : History of Falls (last six months);Needs assist             Mobility Comments: ambulates without AD in home; RW in community; several falls within last 6 months ADLs Comments: Pt reports being ind with self care  tasks and sister performs all IADLs.    OT Problem List: Decreased strength;Decreased activity tolerance;Impaired balance (sitting and/or standing);Decreased safety awareness;Pain   OT Treatment/Interventions: Self-care/ADL training;Therapeutic exercise;Patient/family education;Balance training;Energy conservation;Therapeutic activities;DME and/or AE instruction      OT Goals(Current goals can be found in the care plan section)   Acute Rehab OT Goals Patient Stated Goal: improve pain OT Goal Formulation: With patient Time For Goal Achievement: 04/11/24 Potential to Achieve Goals: Good ADL Goals Pt Will Perform Grooming: with set-up;sitting;standing Pt Will Perform Lower Body Dressing: with set-up;with supervision;sit to/from stand;sitting/lateral leans Pt Will Transfer to Toilet: with supervision;ambulating;with modified independence Pt Will Perform Toileting - Clothing Manipulation and hygiene: with modified independence;sit to/from stand;sitting/lateral leans   OT Frequency:  Min 2X/week    Co-evaluation              AM-PAC OT 6 Clicks Daily Activity     Outcome Measure Help from another person eating meals?: None Help from another person taking care of personal grooming?: A Little Help from another person toileting, which includes using toliet, bedpan, or urinal?: A Little Help from another person bathing (including washing, rinsing, drying)?: A Lot Help from another  person to put on and taking off regular upper body clothing?: A Little Help from another person to put on and taking off regular lower body clothing?: A Lot 6 Click Score: 17   End of Session Equipment Utilized During Treatment: Rolling walker (2 wheels);Gait belt Nurse Communication: Mobility status  Activity Tolerance: Patient tolerated treatment well Patient left: in bed;with call bell/phone within reach;with bed alarm set  OT Visit Diagnosis: Unsteadiness on feet (R26.81);Other abnormalities of  gait and mobility (R26.89);Muscle weakness (generalized) (M62.81)                Time: 8865-8843 OT Time Calculation (min): 22 min Charges:  OT General Charges $OT Visit: 1 Visit OT Evaluation $OT Eval Moderate Complexity: 1 Mod  Nilaya Bouie Chrismon, OTR/L 03/28/24, 3:02 PM  Margaretta Chittum E Chrismon 03/28/2024, 2:59 PM

## 2024-03-29 ENCOUNTER — Other Ambulatory Visit: Payer: Self-pay

## 2024-03-29 ENCOUNTER — Telehealth: Payer: Self-pay | Admitting: *Deleted

## 2024-03-29 LAB — CBC WITH DIFFERENTIAL/PLATELET
Abs Immature Granulocytes: 0.02 10*3/uL (ref 0.00–0.07)
Basophils Absolute: 0 10*3/uL (ref 0.0–0.1)
Basophils Relative: 1 %
Eosinophils Absolute: 0.2 10*3/uL (ref 0.0–0.5)
Eosinophils Relative: 4 %
HCT: 26.9 % — ABNORMAL LOW (ref 39.0–52.0)
Hemoglobin: 8.5 g/dL — ABNORMAL LOW (ref 13.0–17.0)
Immature Granulocytes: 0 %
Lymphocytes Relative: 34 %
Lymphs Abs: 2 10*3/uL (ref 0.7–4.0)
MCH: 26.1 pg (ref 26.0–34.0)
MCHC: 31.6 g/dL (ref 30.0–36.0)
MCV: 82.5 fL (ref 80.0–100.0)
Monocytes Absolute: 0.7 10*3/uL (ref 0.1–1.0)
Monocytes Relative: 12 %
Neutro Abs: 2.9 10*3/uL (ref 1.7–7.7)
Neutrophils Relative %: 49 %
Platelets: 178 10*3/uL (ref 150–400)
RBC: 3.26 MIL/uL — ABNORMAL LOW (ref 4.22–5.81)
RDW: 15.3 % (ref 11.5–15.5)
WBC: 5.9 10*3/uL (ref 4.0–10.5)
nRBC: 0 % (ref 0.0–0.2)

## 2024-03-29 LAB — HIV ANTIBODY (ROUTINE TESTING W REFLEX): HIV Screen 4th Generation wRfx: NONREACTIVE

## 2024-03-29 LAB — GLUCOSE, CAPILLARY: Glucose-Capillary: 77 mg/dL (ref 70–99)

## 2024-03-29 MED ORDER — ONDANSETRON HCL 4 MG PO TABS
4.0000 mg | ORAL_TABLET | Freq: Four times a day (QID) | ORAL | 0 refills | Status: AC | PRN
  Filled 2024-03-29: qty 20, 5d supply, fill #0

## 2024-03-29 MED ORDER — OXYCODONE-ACETAMINOPHEN 5-325 MG PO TABS
1.0000 | ORAL_TABLET | Freq: Four times a day (QID) | ORAL | 0 refills | Status: AC | PRN
  Filled 2024-03-29: qty 15, 4d supply, fill #0

## 2024-03-29 MED ORDER — ENSURE PLUS HIGH PROTEIN PO LIQD
237.0000 mL | Freq: Two times a day (BID) | ORAL | Status: DC
  Administered 2024-03-29: 237 mL via ORAL

## 2024-03-29 NOTE — Care Management Obs Status (Signed)
 MEDICARE OBSERVATION STATUS NOTIFICATION   Patient Details  Name: John Spears MRN: [id number] Date of Birth: 23-Sep-1950   Medicare Observation Status Notification Given:  Yes    Lavergne Hiltunen 03/29/2024, 10:24 AM

## 2024-03-29 NOTE — Discharge Summary (Signed)
 " Physician Discharge Summary   Patient: John Spears MRN: [id number] DOB: 28-Jun-1950  Admit date:     03/27/2024  Discharge date: 03/29/24  Discharge Physician: Carliss LELON Canales   PCP: Patient, No Pcp Per   Recommendations at discharge:    Pt to be discharged home with home health.   If you experience worsening fever, chills, chest pain, shortness of breath, or other concerning symptoms, please call your PCP or go to the emergency department immediately.  Discharge Diagnoses: Principal Problem:   Fall Active Problems:   Essential hypertension   Chronic diastolic CHF (congestive heart failure) (HCC)   CKD (chronic kidney disease), stage III (HCC)   HLD (hyperlipidemia)   BPH (benign prostatic hyperplasia)   Seizures (HCC)   H/O: stroke   Closed compression fracture of L3 lumbar vertebra, initial encounter (HCC)  Resolved Problems:   * No resolved hospital problems. *   Hospital Course:  74 y.o. year old male with medical history of hypertension, hyperlipidemia, seizure disorder, CHF, CKD 3a, paroxysmal atrial fibrillation presenting to the ED after a fall. CBC without leukocytosis and baseline anemia. CMP unremarkable. Troponin checked and normal. Ammonia checked and normal. UA without signs of infection.  CT head without acute findings. CT cervical spine without any acute findings. CT chest abdomen pelvis with contrast obtained did not show any acute findings but did show compression fracture of L3. Neurosurgery on board. No surgical interventions needed. He lives at home with his sister.  Assessment and Plan:  Acute L3 compression fracture (POA) - Neurosurgery evaluation.  No indications for surgical intervention.  As needed analgesics ordered.  LSO brace as well.  Follow-up with neurosurgery in outpatient setting in 3 to 4 weeks.  Acute metabolic encephalopathy - Appears to be resolved.  Physical debilitation muscle weakness/fall - PT/OT evaluation.  Recommending  home health.  BPH - Flomax  on board.  Hypertension - Continue amlodipine  10 mg daily.  Will discontinue Coreg  due to noted bradycardia.  Recommend follow-up with PCP to titrate antihypertensive regimen.   Consultants: Neurosurgery Procedures performed: None Disposition: Home health Diet recommendation:  Cardiac diet  DISCHARGE MEDICATION: Allergies as of 03/29/2024   No Known Allergies      Medication List     STOP taking these medications    carvedilol  12.5 MG tablet Commonly known as: COREG        TAKE these medications    acetaminophen  500 MG tablet Commonly known as: TYLENOL  Take 1 tablet (500 mg total) by mouth every 6 (six) hours as needed for moderate pain (pain score 4-6).   amLODipine  10 MG tablet Commonly known as: NORVASC  Take 10 mg by mouth at bedtime.   Aspirin  Low Dose 81 MG tablet Generic drug: aspirin  EC Take 81 mg by mouth daily.   atorvastatin  40 MG tablet Commonly known as: LIPITOR Take 40 mg by mouth at bedtime.   cyanocobalamin  1000 MCG tablet Commonly known as: VITAMIN B12 Take 1,000 mcg by mouth in the morning.   feeding supplement Liqd Take 237 mLs by mouth 2 (two) times daily between meals.   folic acid  1 MG tablet Commonly known as: FOLVITE  Take 1 mg by mouth in the morning.   furosemide  20 MG tablet Commonly known as: LASIX  Take 1 tablet (20 mg total) by mouth daily.   gabapentin  100 MG capsule Commonly known as: NEURONTIN  Take 100 mg by mouth 2 (two) times daily.   hydrocortisone 2.5 % cream Apply 1 Application topically 2 (two) times daily  as needed.   Iron -Vitamin C  65-125 MG Tabs Take 1 tablet by mouth daily.   lacosamide  200 MG Tabs tablet Commonly known as: VIMPAT  Take 200 mg by mouth 2 (two) times daily.   levETIRAcetam  750 MG tablet Commonly known as: KEPPRA  Take 2 tablets (1,500 mg total) by mouth 2 (two) times daily.   multivitamins with iron  Tabs tablet Take 1 tablet by mouth at bedtime.    ondansetron  4 MG tablet Commonly known as: ZOFRAN  Take 1 tablet (4 mg total) by mouth every 6 (six) hours as needed for nausea.   oxyCODONE -acetaminophen  5-325 MG tablet Commonly known as: PERCOCET/ROXICET Take 1 tablet by mouth every 6 (six) hours as needed for moderate pain (pain score 4-6) or severe pain (pain score 7-10).   polyethylene glycol 17 g packet Commonly known as: MIRALAX  / GLYCOLAX  Take 34 g by mouth daily.   senna 8.6 MG Tabs tablet Commonly known as: SENOKOT Take 1 tablet (8.6 mg total) by mouth daily.   tamsulosin  0.4 MG Caps capsule Commonly known as: FLOMAX  Take 0.4 mg by mouth at bedtime.   thiamine  100 MG tablet Commonly known as: VITAMIN B1 Take 1 tablet (100 mg total) by mouth in the morning.   Xarelto  20 MG Tabs tablet Generic drug: rivaroxaban  TAKE 1 TABLET BY MOUTH ONCE A DAY WITH SUPPER         Discharge Exam: Filed Weights   03/28/24 1215 03/29/24 0624  Weight: 78 kg 77.4 kg    GENERAL:  Alert, pleasant, no acute distress  HEENT:  EOMI CARDIOVASCULAR: Regular bradycardic, no murmurs appreciated RESPIRATORY:  Clear to auscultation, no wheezing, rales, or rhonchi GASTROINTESTINAL:  Soft, nontender, nondistended EXTREMITIES:  No LE edema bilaterally NEURO:  No new focal deficits appreciated SKIN:  No rashes noted PSYCH:  Appropriate mood and affect     Condition at discharge: improving  The results of significant diagnostics from this hospitalization (including imaging, microbiology, ancillary and laboratory) are listed below for reference.   Imaging Studies: CT L-SPINE NO CHARGE Result Date: 03/28/2024 EXAM: CT OF THE LUMBAR SPINE WITHOUT CONTRAST 03/27/2024 11:59:50 PM TECHNIQUE: CT of the lumbar spine was performed without the administration of intravenous contrast. Multiplanar reformatted images are provided for review. Automated exposure control, iterative reconstruction, and/or weight based adjustment of the mA/kV was utilized  to reduce the radiation dose to as low as reasonably achievable. COMPARISON: None available. CLINICAL HISTORY: Back trauma, no prior imaging (Age >= 16y). Back trauma; no prior imaging; age >= 16 years. FINDINGS: BONES AND ALIGNMENT: Normal vertebral body heights except for L3. Acute wedge compression fracture of L3 with 50% height loss. The fracture affects the anterior wall and superior endplate. There is no retropulsion or associated spinal canal stenosis. Transitional lumbosacral anatomy with partial sacralization of L5 with bilateral assimilation joints. Normal alignment. No other acute fracture or suspicious bone lesion. DEGENERATIVE CHANGES: Severe stenosis of the right L4 neural foramen. No other significant degenerative changes. SOFT TISSUES: No acute abnormality. IMPRESSION: 1. Acute wedge compression fracture of L3 with 50% height loss, involving the anterior wall and superior endplate. No retropulsion or associated spinal canal stenosis. Electronically signed by: Franky Stanford MD 03/28/2024 12:10 AM EST RP Workstation: [id number]   CT CHEST ABDOMEN PELVIS W CONTRAST Result Date: 03/27/2024 EXAM: CT CHEST, ABDOMEN AND PELVIS WITH CONTRAST 03/27/2024 08:19:37 PM TECHNIQUE: CT of the chest, abdomen and pelvis was performed with the administration of 100 mL iohexol  (OMNIPAQUE ) 300 MG/ML solution. Multiplanar reformatted images are provided  for review. Automated exposure control, iterative reconstruction, and/or weight based adjustment of the mA/kV was utilized to reduce the radiation dose to as low as reasonably achievable. COMPARISON: CT abdomen and pelvis 07/28/2023 and CT angiogram chest 10/15/2022. CLINICAL HISTORY: Polytrauma, blunt. Polytrauma due to blunt trauma. FINDINGS: CHEST: MEDIASTINUM AND LYMPH NODES: Heart and pericardium are unremarkable. The central airways are clear. No mediastinal, hilar or axillary lymphadenopathy. LUNGS AND PLEURA: There is a ground glass nodule in the left upper lobe  measuring 4 mm on image 4/66. There is a 2 mm nodule in the right upper lobe image 4/89. The lungs are otherwise clear. No pleural effusion. No pneumothorax. ABDOMEN AND PELVIS: LIVER: Unremarkable. GALLBLADDER AND BILE DUCTS: Unremarkable. No biliary ductal dilatation. SPLEEN: No acute abnormality. PANCREAS: No acute abnormality. ADRENAL GLANDS: No acute abnormality. KIDNEYS, URETERS AND BLADDER: No stones in the kidneys or ureters. No hydronephrosis. No perinephric or periureteral stranding. The distal ureters are not well visualized secondary to streak artifact in the pelvis. Urinary bladder is unremarkable. GI AND BOWEL: Stomach demonstrates no acute abnormality. The appendix appears normal. There is no bowel obstruction. REPRODUCTIVE ORGANS: No acute abnormality. PERITONEUM AND RETROPERITONEUM: No ascites. No free air. VASCULATURE: Aorta is normal in caliber. ABDOMINAL AND PELVIS LYMPH NODES: No lymphadenopathy. BONES AND SOFT TISSUES: There is a stable mildly expansile mixed sclerotic and lucent lesion in the anterior right 6th rib. Anterior right 7th and 8th rib fractures appear chronic, likely fibrous dysplasia. Bilateral gynecomastia. Right hip arthroplasty is present. There are healed left superior and inferior pubic rami fractures. There is moderate compression deformity of the superior endplate of L3 which is new from the prior examination age indeterminate. No focal soft tissue abnormality. IMPRESSION: 1. No evidence of acute traumatic injury. 2. Moderate compression deformity of the superior endplate of L3, new from the prior examination, age indeterminate. 3. Left upper lobe 4 mm ground-glass nodule and right upper lobe 2 mm pulmonary nodule, with no routine follow-up imaging recommended as per Fleischner Society Guidelines. Electronically signed by: Greig Pique MD 03/27/2024 08:39 PM EST RP Workstation: [id number]   CT Cervical Spine Wo Contrast Result Date: 03/27/2024 EXAM: CT CERVICAL SPINE  WITHOUT CONTRAST 03/27/2024 08:19:37 PM TECHNIQUE: CT of the cervical spine was performed without the administration of intravenous contrast. Multiplanar reformatted images are provided for review. Automated exposure control, iterative reconstruction, and/or weight based adjustment of the mA/kV was utilized to reduce the radiation dose to as low as reasonably achievable. COMPARISON: 12 / 11 / 25 CLINICAL HISTORY: Neck trauma (Age >= 65y). Neck trauma (age >= 65 years). FINDINGS: BONES AND ALIGNMENT: Mild retrolisthesis of C5. No acute fracture or traumatic malalignment. DEGENERATIVE CHANGES: Multilevel spondylosis, disc space height loss, and degenerative endplate changes greatest at C5 - C6 where it is moderate. SOFT TISSUES: No prevertebral soft tissue swelling. IMPRESSION: 1. No evidence of acute traumatic injury. Electronically signed by: Norman Gatlin MD 03/27/2024 08:32 PM EST RP Workstation: [id number]   CT Head Wo Contrast Result Date: 03/27/2024 EXAM: CT HEAD WITHOUT CONTRAST 03/27/2024 08:19:37 PM TECHNIQUE: CT of the head was performed without the administration of intravenous contrast. Automated exposure control, iterative reconstruction, and/or weight based adjustment of the mA/kV was utilized to reduce the radiation dose to as low as reasonably achievable. COMPARISON: 02/28/2024 CLINICAL HISTORY: Post-traumatic headache. FINDINGS: BRAIN AND VENTRICLES: No acute hemorrhage. No evidence of acute infarct. Stable atrophy and chronic small vessel ischemia. Remote left parietooccipital infarct with encephalomalacia, unchanged. Remote right thalamic lacunar infarct. No  hydrocephalus. No extra-axial collection. No mass effect or midline shift. ORBITS: Unremarkable . SINUSES: No acute abnormality. SOFT TISSUES AND SKULL: Right frontal scalp lipoma. Atherosclerosis of skullbase vasculature without hyperdense vessel or abnormal calcification. No acute soft tissue abnormality. No skull fracture. IMPRESSION: 1.  No acute intracranial abnormality. Electronically signed by: Norman Gatlin MD 03/27/2024 08:28 PM EST RP Workstation: [id number]   DG Femur Min 2 Views Right Result Date: 03/27/2024 CLINICAL DATA:  Pain after fall EXAM: RIGHT FEMUR 2 VIEWS COMPARISON:  02/04/2024 FINDINGS: Frontal and lateral views of the right femur are obtained. Right hip hemiarthroplasty is in stable position. No evidence of acute complication. Heterotopic ossification and enthesopathic changes are again seen along the greater trochanter, stable since prior study. There are no acute displaced fractures. Alignment of the right knee is anatomic, with moderate medial compartmental osteoarthritis noted. Subcutaneous edema within the lateral proximal right thigh compatible with contusion. IMPRESSION: 1. No evidence of acute displaced fracture. 2. Stable right hip hemiarthroplasty, with no evidence of acute complication. 3. Subcutaneous edema within the proximal lateral right thigh compatible with contusion. Electronically Signed   By: Ozell Daring M.D.   On: 03/27/2024 18:02   DG Shoulder Left Result Date: 03/27/2024 CLINICAL DATA:  Clemens, left shoulder pain EXAM: LEFT SHOULDER - 2+ VIEW COMPARISON:  None Available. FINDINGS: Frontal, transscapular, and axillary views of the left shoulder are obtained. No acute fracture, subluxation, or dislocation. Mild glenohumeral and acromioclavicular joint osteoarthritis. The soft tissues are unremarkable. The left chest is clear. IMPRESSION: 1. Mild osteoarthritis.  No acute fracture. Electronically Signed   By: Ozell Daring M.D.   On: 03/27/2024 18:00   EEG adult Result Date: 03/01/2024 Shelton Arlin KIDD, MD     03/01/2024 12:30 PM Patient Name: Compton Brigance MRN: [id number] Epilepsy Attending: Arlin KIDD Shelton Referring Physician/Provider: Fernand Prost, MD Date: 03/01/2024 Duration: 28.16 mins Patient history: 74yo M with multiple episodes of breakthrough seizures. EEG to evaluate for seizure Level  of alertness: Awake, asleep AEDs during EEG study: LEV, LCM, Ativan  Technical aspects: This EEG study was done with scalp electrodes positioned according to the 10-20 International system of electrode placement. Electrical activity was reviewed with band pass filter of 1-70Hz , sensitivity of 7 uV/mm, display speed of 24mm/sec with a 60Hz  notched filter applied as appropriate. EEG data were recorded continuously and digitally stored.  Video monitoring was available and reviewed as appropriate. Description: The posterior dominant rhythm consists of 8 Hz activity of moderate voltage (25-35 uV) seen predominantly in posterior head regions, symmetric and reactive to eye opening and eye closing. Sleep was characterized by vertex waves, sleep spindles (12 to 14 Hz), maximal frontocentral region.  There is continuous 3 to 6 Hz theta-delta slowing in left hemisphere. Hyperventilation and photic stimulation were not performed.   ABNORMALITY - Continuous slow,  left hemisphere IMPRESSION: This study is suggestive of cortical dysfunction arising from left hemisphere likely secondary to underlying structural abnormality. No seizures or epileptiform discharges were seen throughout the recording. Arlin KIDD Shelton   ECHOCARDIOGRAM COMPLETE BUBBLE STUDY Result Date: 02/29/2024    ECHOCARDIOGRAM REPORT   Patient Name:   FLETCHER OSTERMILLER Date of Exam: 02/29/2024 Medical Rec #:  [id number]          Height:       71.0 in Accession #:    [id number]         Weight:       171.0 lb Date of Birth:  04/30/1950  BSA:          1.973 m Patient Age:    73 years           BP:           178/70 mmHg Patient Gender: M                  HR:           52 bpm. Exam Location:  [facility] Procedure: 2D Echo, Cardiac Doppler, Color Doppler, 3D Echo, Strain Analysis and            Saline Contrast Bubble Study (Both Spectral and Color Flow Doppler            were utilized during procedure). Indications:     Stroke 434.91 / I63.9  History:         Patient  has prior history of Echocardiogram examinations, most                  recent 06/24/2022. Arrythmias:Atrial Fibrillation; Risk                  Factors:Hypertension.  Sonographer:     Christopher Furnace Referring Phys:  [id number] SUMAYYA AMIN Diagnosing Phys: Cara JONETTA Lovelace MD  Sonographer Comments: Global longitudinal strain was attempted. IMPRESSIONS  1. Left ventricular ejection fraction, by estimation, is 65 to 70%. The left ventricle has normal function. The left ventricle has no regional wall motion abnormalities. There is moderate asymmetric left ventricular hypertrophy of the inferior and basal  segments. Left ventricular diastolic parameters are consistent with Grade II diastolic dysfunction (pseudonormalization). The average left ventricular global longitudinal strain is 16.1 %. The global longitudinal strain is normal.  2. Right ventricular systolic function is normal. The right ventricular size is normal.  3. The mitral valve is normal in structure. Trivial mitral valve regurgitation.  4. The aortic valve is grossly normal. Aortic valve regurgitation is not visualized. Aortic valve sclerosis/calcification is present, without any evidence of aortic stenosis. FINDINGS  Left Ventricle: Left ventricular ejection fraction, by estimation, is 65 to 70%. The left ventricle has normal function. The left ventricle has no regional wall motion abnormalities. The average left ventricular global longitudinal strain is 16.1 %. Strain was performed and the global longitudinal strain is normal. The left ventricular internal cavity size was normal in size. There is moderate asymmetric left ventricular hypertrophy of the inferior and basal segments. Left ventricular diastolic parameters are consistent with Grade II diastolic dysfunction (pseudonormalization). Right Ventricle: The right ventricular size is normal. No increase in right ventricular wall thickness. Right ventricular systolic function is normal. Left Atrium: Left  atrial size was normal in size. Right Atrium: Right atrial size was normal in size. Pericardium: There is no evidence of pericardial effusion. Mitral Valve: The mitral valve is normal in structure. Trivial mitral valve regurgitation. MV peak gradient, 4.2 mmHg. The mean mitral valve gradient is 2.0 mmHg. Tricuspid Valve: The tricuspid valve is normal in structure. Tricuspid valve regurgitation is trivial. Aortic Valve: The aortic valve is grossly normal. Aortic valve regurgitation is not visualized. Aortic valve sclerosis/calcification is present, without any evidence of aortic stenosis. Aortic valve mean gradient measures 3.0 mmHg. Aortic valve peak gradient measures 5.0 mmHg. Aortic valve area, by VTI measures 2.34 cm. Pulmonic Valve: The pulmonic valve was normal in structure. Pulmonic valve regurgitation is not visualized. Aorta: The ascending aorta was not well visualized. IAS/Shunts: No atrial level shunt detected by color flow Doppler. Agitated saline contrast  was given intravenously to evaluate for intracardiac shunting. Additional Comments: 3D was performed not requiring image post processing on an independent workstation and was normal.  LEFT VENTRICLE PLAX 2D LVIDd:         3.80 cm   Diastology LVIDs:         2.40 cm   LV e' medial:    5.98 cm/s LV PW:         1.10 cm   LV E/e' medial:  15.4 LV IVS:        1.50 cm   LV e' lateral:   5.77 cm/s LVOT diam:     2.20 cm   LV E/e' lateral: 16.0 LV SV:         66 LV SV Index:   33        2D Longitudinal Strain LVOT Area:     3.80 cm  2D Strain GLS (A4C):   16.8 % LV IVRT:       140 msec  2D Strain GLS (A3C):   15.0 %                          2D Strain GLS (A2C):   16.4 %                          2D Strain GLS Avg:     16.1 % RIGHT VENTRICLE RV Basal diam:  4.10 cm     PULMONARY VEINS RV Mid diam:    3.20 cm     Diastolic Velocity: 82.10 cm/s RV S prime:     12.80 cm/s  S/D Velocity:       0.80 TAPSE (M-mode): 2.2 cm      Systolic Velocity:  63.60 cm/s LEFT  ATRIUM             Index        RIGHT ATRIUM           Index LA diam:        3.40 cm 1.72 cm/m   RA Area:     19.50 cm LA Vol (A2C):   32.9 ml 16.68 ml/m  RA Volume:   51.50 ml  26.11 ml/m LA Vol (A4C):   53.4 ml 27.07 ml/m LA Biplane Vol: 42.2 ml 21.39 ml/m  AORTIC VALVE AV Area (Vmax):    2.52 cm AV Area (Vmean):   2.30 cm AV Area (VTI):     2.34 cm AV Vmax:           112.00 cm/s AV Vmean:          83.800 cm/s AV VTI:            0.281 m AV Peak Grad:      5.0 mmHg AV Mean Grad:      3.0 mmHg LVOT Vmax:         74.20 cm/s LVOT Vmean:        50.600 cm/s LVOT VTI:          0.173 m LVOT/AV VTI ratio: 0.62  AORTA Ao Root diam: 3.40 cm MITRAL VALVE               TRICUSPID VALVE MV Area (PHT): 2.81 cm    TR Peak grad:   6.2 mmHg MV Area VTI:   1.89 cm    TR Vmax:        125.00 cm/s MV Peak grad:  4.2 mmHg MV Mean grad:  2.0 mmHg    SHUNTS MV Vmax:       1.02 m/s    Systemic VTI:  0.17 m MV Vmean:      62.7 cm/s   Systemic Diam: 2.20 cm MV Decel Time: 270 msec MV E velocity: 92.20 cm/s MV A velocity: 81.80 cm/s MV E/A ratio:  1.13 Dwayne D Callwood MD Electronically signed by Cara JONETTA Lovelace MD Signature Date/Time: 02/29/2024/2:39:42 PM    Final    MR BRAIN WO CONTRAST Result Date: 02/29/2024 EXAM: MRI Brain Without Contrast 02/29/2024 01:29:05 AM TECHNIQUE: Multiplanar multisequence MRI of the head/brain was performed without the administration of intravenous contrast. COMPARISON: CT Head 02/28/24 CLINICAL HISTORY: Neuro deficit, acute, stroke suspected FINDINGS: BRAIN AND VENTRICLES: Remote left posterior MCA territory infarct with left parieto-occipital encephalomalacia. No acute infarct. No intracranial hemorrhage. No mass. No midline shift. No hydrocephalus. The sella is unremarkable. Normal flow voids. ORBITS: No acute abnormality. SINUSES AND MASTOIDS: No acute abnormality. BONES AND SOFT TISSUES: Normal marrow signal. No acute soft tissue abnormality. IMPRESSION: 1. No acute intracranial abnormality. 2.  Remote left posterior MCA territory infarct. Electronically signed by: Gilmore Molt 02/29/2024 02:00 AM EST RP Workstation: [id number]   CT ANGIO HEAD NECK W WO CM (CODE STROKE) Result Date: 02/28/2024 EXAM: CTA Head and Neck with Intravenous Contrast. CLINICAL HISTORY: Neuro deficit, acute, stroke suspected. TECHNIQUE: Axial CTA images of the head and neck performed with intravenous contrast. MIP reconstructed images were created and reviewed. Note: Per PQRS, the description of internal carotid artery percent stenosis, including 0 percent or normal exam, is based on [place] [employer] Symptomatic Carotid Endarterectomy Trial (NASCET) criteria. Dose reduction technique was used including one or more of the following: automated exposure control, adjustment of mA and kV according to patient size, and/or iterative reconstruction. CONTRAST: With; COMPARISON: None provided. FINDINGS: CTA NECK: COMMON CAROTID ARTERIES: No significant stenosis. No dissection or occlusion. INTERNAL CAROTID ARTERIES: No stenosis by NASCET criteria. Mild to moderate right intracranial ICA stenosis. No dissection or occlusion. VERTEBRAL ARTERIES: No significant stenosis. No dissection or occlusion. CTA HEAD: ANTERIOR CEREBRAL ARTERIES: No significant stenosis. No occlusion. No aneurysm. MIDDLE CEREBRAL ARTERIES: No significant stenosis. No occlusion. No aneurysm. POSTERIOR CEREBRAL ARTERIES: Moderate right p2 PCA stenosis. No occlusion. No aneurysm. BASILAR ARTERY: No significant stenosis. No occlusion. No aneurysm. OTHER: SOFT TISSUES: No acute finding. No masses or lymphadenopathy. BONES: No acute osseous abnormality. IMPRESSION: 1. No large vessel occlusion. 2. Moderate right P2 PCA stenosis. 3. Mild to moderate right intracranial ICA stenosis. Electronically signed by: Gilmore Molt 02/28/2024 08:21 PM EST RP Workstation: [id number]   CT HEAD CODE STROKE WO CONTRAST Result Date: 02/28/2024 EXAM: CT HEAD WITHOUT CONTRAST 02/28/2024  07:34:13 PM TECHNIQUE: CT of the head was performed without the administration of intravenous contrast. Automated exposure control, iterative reconstruction, and/or weight based adjustment of the mA/kV was utilized to reduce the radiation dose to as low as reasonably achievable. COMPARISON: Head CT 02/03/2024 and MRI 08/26/2023. CLINICAL HISTORY: Neuro deficit, acute, stroke suspected. FINDINGS: BRAIN AND VENTRICLES: There is no evidence of an acute infarct, intracranial hemorrhage, mass, midline shift, hydrocephalus, or extra-axial fluid collection. A moderate sized chronic infarct is again noted in the posterior left MCA territory, and there is also an unchanged chronic lacunar infarct in the right thalamus. There is ex vacuo dilatation of the left lateral ventricle. There is mild cerebral atrophy. Mild hypodensities elsewhere in the cerebral white matter bilaterally are nonspecific but compatible with  chronic small vessel ischemic disease. Calcified atherosclerosis at the skull base. ORBITS: No acute abnormality. SINUSES: Mild mucosal thickening in the ethmoid sinuses. Clear mastoid air cells. SOFT TISSUES AND SKULL: Small right frontal scalp lipoma. No skull fracture. [place] Stroke Program Early CT Score (ASPECTS) ----- Ganglionic (caudate, IC, lentiform nucleus, insula, M1-M3): 7 Supraganglionic (M4-M6): 3 Total: 10 These results were communicated by telephone to Dr. Fernand on 02/28/2024 at 7:48 pm. IMPRESSION: 1. No acute intracranial abnormality.  ASPECTS of 10. 2. 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 Electronically signed by: Dasie Hamburg MD 02/28/2024 07:48 PM EST RP Workstation: [id number]    Microbiology: Results for orders placed or performed during the hospital encounter of 10/06/23  Resp panel by RT-PCR (RSV, Flu A&B, Covid) Anterior Nasal Swab     Status: None   Collection Time: 10/08/23  1:51 PM   Specimen: Anterior Nasal Swab  Result Value Ref Range Status   SARS Coronavirus 2 by RT  PCR NEGATIVE NEGATIVE Final    Comment: (NOTE) SARS-CoV-2 target nucleic acids are NOT DETECTED.  The SARS-CoV-2 RNA is generally detectable in upper respiratory specimens during the acute phase of infection. The lowest concentration of SARS-CoV-2 viral copies this assay can detect is 138 copies/mL. A negative result does not preclude SARS-Cov-2 infection and should not be used as the sole basis for treatment or other patient management decisions. A negative result may occur with  improper specimen collection/handling, submission of specimen other than nasopharyngeal swab, presence of viral mutation(s) within the areas targeted by this assay, and inadequate number of viral copies(<138 copies/mL). A negative result must be combined with clinical observations, patient history, and epidemiological information. The expected result is Negative.  Fact Sheet for Patients:  bloggercourse.com  Fact Sheet for Healthcare Providers:  seriousbroker.it  This test is no t yet approved or cleared by the [place]  FDA and  has been authorized for detection and/or diagnosis of SARS-CoV-2 by FDA under an Emergency Use Authorization (EUA). This EUA will remain  in effect (meaning this test can be used) for the duration of the COVID-19 declaration under Section 564(b)(1) of the Act, 21 U.S.C.section 360bbb-3(b)(1), unless the authorization is terminated  or revoked sooner.       Influenza A by PCR NEGATIVE NEGATIVE Final   Influenza B by PCR NEGATIVE NEGATIVE Final    Comment: (NOTE) The Xpert Xpress SARS-CoV-2/FLU/RSV plus assay is intended as an aid in the diagnosis of influenza from Nasopharyngeal swab specimens and should not be used as a sole basis for treatment. Nasal washings and aspirates are unacceptable for Xpert Xpress SARS-CoV-2/FLU/RSV testing.  Fact Sheet for Patients: bloggercourse.com  Fact Sheet for  Healthcare Providers: seriousbroker.it  This test is not yet approved or cleared by the [place]  FDA and has been authorized for detection and/or diagnosis of SARS-CoV-2 by FDA under an Emergency Use Authorization (EUA). This EUA will remain in effect (meaning this test can be used) for the duration of the COVID-19 declaration under Section 564(b)(1) of the Act, 21 U.S.C. section 360bbb-3(b)(1), unless the authorization is terminated or revoked.     Resp Syncytial Virus by PCR NEGATIVE NEGATIVE Final    Comment: (NOTE) Fact Sheet for Patients: bloggercourse.com  Fact Sheet for Healthcare Providers: seriousbroker.it  This test is not yet approved or cleared by the [place]  FDA and has been authorized for detection and/or diagnosis of SARS-CoV-2 by FDA under an Emergency Use Authorization (EUA). This EUA will remain in effect (meaning this test  can be used) for the duration of the COVID-19 declaration under Section 564(b)(1) of the Act, 21 U.S.C. section 360bbb-3(b)(1), unless the authorization is terminated or revoked.  Performed at [facility], [address] [place], [place] [address]     Labs: CBC: Recent Labs  Lab 03/27/24 1733 03/28/24 0430 03/29/24 0532  WBC 7.7 6.5 5.9  NEUTROABS 4.8  --  2.9  HGB 9.6* 8.9* 8.5*  HCT 30.6* 28.7* 26.9*  MCV 83.8 83.7 82.5  PLT 195 186 178   Basic Metabolic Panel: Recent Labs  Lab 03/27/24 1733 03/27/24 1950 03/28/24 0430  NA 145  --  142  K 3.7  --  3.3*  CL 108  --  107  CO2 26  --  27  GLUCOSE 98  --  113*  BUN 18  --  18  CREATININE 1.00  --  1.03  CALCIUM  9.2  --  8.9  MG  --  1.4* 2.6*  PHOS  --  3.0  --    Liver Function Tests: Recent Labs  Lab 03/27/24 1733  AST 24  ALT 17  ALKPHOS 119  BILITOT 0.3  PROT 6.7  ALBUMIN 3.9   CBG: Recent Labs  Lab 03/29/24 0851  GLUCAP 77    Discharge time spent: 26  minutes.  Length of inpatient stay: 0 days  Signed: Carliss LELON Canales, DO [facility] 03/29/2024         "

## 2024-03-29 NOTE — Telephone Encounter (Signed)
 Channing with [facility] ([phone]) calling and left vm regarding home health orders. Pt d/c from [facility] and requiring services PT and OT. Pcp at [facility] has left the practice 2 weeks ago and unable to be the consignor of HH orders. She was requesting Dr. Babara to cover orders while pt was under [facility]. Reviewed chart. Patient sustained Acute L3 compression fracture after recent fall and has multiple other health issues. He was referred to [facility] due to debilitating muscle fall/weakness. Patient is only following with Dr. Babara for h/o anemia  Dr. Babara, Please advise-should this not go back to the covering providers at [facility]?. You do not have follow-up with this patient until April and did not see this patient during his recent hospitalization.

## 2024-03-29 NOTE — Telephone Encounter (Signed)
 Spoke with Channing at [facility]. Explained that Dr. Babara would like providers at [facility] to handle the [facility] since we are only following the patient for anemia. Per Channing, this would require pt to make an apt with a new provider at [facility]. No provider is comfortable signing orders w/o a visit. She stated that she would reach out to the hospitalist to see if she can get orders from inpatient provider. In the meantime, she is trying to work out an option for patient to get a possible virtual visit arranged. She thanked me for calling her back.

## 2024-03-29 NOTE — TOC Transition Note (Signed)
 Transition of Care [facility]) - Discharge Note   Patient Details  Name: John Spears MRN: [id number] Date of Birth: 12-Jan-1951  [facility] CM/SW Contact:  Nathanael CHRISTELLA Ring, RN Phone Number: 03/29/2024, 10:58 AM   Clinical Narrative:     Patient is medically cleared for discharge, CM met with him at the bedside, introduced self and explained role in DC planning.  He reports that he lives with his sister, sister provides transportation and does the shopping and cooking.  He reports he can bath and dress himself and walks with a walker.  He says that he has a PCP but doesn't know the name. CM called sister, Dickey, she is able to come and pick the patient up around 2 pm this afternoon, she will bring him some clothes, she says that he does have a PCP at [facility] it is [facility].  She agrees to [facility] services PT and OT declines BSC.  [facility] accepted [facility] referral, Cheryl aware of DC today and SOC should be within 48 hours.   Final next level of care: Home w Home Health Services Barriers to Discharge: Barriers Resolved   Patient Goals and CMS Choice   CMS Medicare.gov Compare Post Acute Care list provided to:: Patient Represenative (must comment) Choice offered to / list presented to : Sibling      Discharge Placement                       Discharge Plan and Services Additional resources added to the After Visit Summary for     Discharge Planning Services: CM Consult Post Acute Care Choice: Home Health          DME Arranged: N/A         HH Arranged: PT, OT          Social Drivers of Health (SDOH) Interventions SDOH Screenings   Food Insecurity: Food Insecurity Present (03/28/2024)  Housing: Low Risk (03/28/2024)  Transportation Needs: No Transportation Needs (03/28/2024)  Recent Concern: Transportation Needs - Unmet Transportation Needs (02/10/2024)  Utilities: Not At Risk (03/28/2024)  Depression (PHQ2-9): Low Risk (02/10/2024)  Financial Resource  Strain: Medium Risk (02/10/2024)  Social Connections: Moderately Isolated (03/28/2024)  Stress: No Stress Concern Present (02/10/2024)  Tobacco Use: Medium Risk (03/28/2024)  Health Literacy: Adequate Health Literacy (02/10/2024)     Readmission Risk Interventions     No data to display

## 2024-03-29 NOTE — Plan of Care (Signed)
  Problem: Education: Goal: Knowledge of General Education information will improve Description: Including pain rating scale, medication(s)/side effects and non-pharmacologic comfort measures Outcome: Progressing   Problem: Clinical Measurements: Goal: Respiratory complications will improve Outcome: Progressing   Problem: Activity: Goal: Risk for activity intolerance will decrease Outcome: Progressing   Problem: Elimination: Goal: Will not experience complications related to urinary retention Outcome: Progressing   Problem: Pain Managment: Goal: General experience of comfort will improve and/or be controlled Outcome: Progressing   Problem: Safety: Goal: Ability to remain free from injury will improve Outcome: Progressing

## 2024-03-29 NOTE — Progress Notes (Signed)
 A small trace of tint blood in the pad. Incoming shift made aware.

## 2024-03-30 LAB — VITAMIN B1: Vitamin B1 (Thiamine): 98.1 nmol/L (ref 66.5–200.0)

## 2024-05-30 ENCOUNTER — Inpatient Hospital Stay

## 2024-06-06 ENCOUNTER — Inpatient Hospital Stay

## 2024-06-06 ENCOUNTER — Inpatient Hospital Stay: Admitting: Oncology

## 2024-06-08 ENCOUNTER — Ambulatory Visit: Admitting: Cardiology
